# Patient Record
Sex: Male | Born: 1965
Health system: Southern US, Community
[De-identification: ages and names within clinical notes are randomized; demographics above are authoritative.]

## PROBLEM LIST (undated history)

## (undated) DIAGNOSIS — M549 Dorsalgia, unspecified: Secondary | ICD-10-CM

## (undated) DIAGNOSIS — R7303 Prediabetes: Secondary | ICD-10-CM

## (undated) DIAGNOSIS — I509 Heart failure, unspecified: Secondary | ICD-10-CM

## (undated) DIAGNOSIS — J449 Chronic obstructive pulmonary disease, unspecified: Secondary | ICD-10-CM

## (undated) DIAGNOSIS — Z973 Presence of spectacles and contact lenses: Secondary | ICD-10-CM

## (undated) DIAGNOSIS — Z9981 Dependence on supplemental oxygen: Secondary | ICD-10-CM

## (undated) DIAGNOSIS — M1990 Unspecified osteoarthritis, unspecified site: Secondary | ICD-10-CM

## (undated) DIAGNOSIS — IMO0002 Reserved for concepts with insufficient information to code with codable children: Secondary | ICD-10-CM

## (undated) DIAGNOSIS — E291 Testicular hypofunction: Secondary | ICD-10-CM

## (undated) DIAGNOSIS — J45909 Unspecified asthma, uncomplicated: Secondary | ICD-10-CM

## (undated) DIAGNOSIS — R519 Headache, unspecified: Secondary | ICD-10-CM

## (undated) DIAGNOSIS — K469 Unspecified abdominal hernia without obstruction or gangrene: Secondary | ICD-10-CM

## (undated) DIAGNOSIS — G4730 Sleep apnea, unspecified: Secondary | ICD-10-CM

## (undated) DIAGNOSIS — R112 Nausea with vomiting, unspecified: Secondary | ICD-10-CM

## (undated) DIAGNOSIS — Z9889 Other specified postprocedural states: Secondary | ICD-10-CM

## (undated) DIAGNOSIS — Z8719 Personal history of other diseases of the digestive system: Secondary | ICD-10-CM

## (undated) DIAGNOSIS — IMO0001 Reserved for inherently not codable concepts without codable children: Secondary | ICD-10-CM

## (undated) DIAGNOSIS — G709 Myoneural disorder, unspecified: Secondary | ICD-10-CM

## (undated) DIAGNOSIS — Z87442 Personal history of urinary calculi: Secondary | ICD-10-CM

## (undated) DIAGNOSIS — K219 Gastro-esophageal reflux disease without esophagitis: Secondary | ICD-10-CM

## (undated) HISTORY — DX: Heart failure, unspecified: I50.9

## (undated) HISTORY — PX: TONSILLECTOMY: SUR1361

## (undated) HISTORY — DX: Testicular hypofunction: E29.1

## (undated) HISTORY — PX: JOINT REPLACEMENT: SHX530

## (undated) HISTORY — PX: OTHER SURGICAL HISTORY: SHX169

## (undated) HISTORY — PX: BACK SURGERY: SHX140

---

## 1997-10-18 ENCOUNTER — Ambulatory Visit (HOSPITAL_COMMUNITY): Admission: RE | Admit: 1997-10-18 | Discharge: 1997-10-18 | Payer: Self-pay | Admitting: Gastroenterology

## 1997-11-07 ENCOUNTER — Inpatient Hospital Stay (HOSPITAL_COMMUNITY): Admission: RE | Admit: 1997-11-07 | Discharge: 1997-11-10 | Payer: Self-pay | Admitting: Neurosurgery

## 1998-04-11 ENCOUNTER — Ambulatory Visit (HOSPITAL_COMMUNITY): Admission: RE | Admit: 1998-04-11 | Discharge: 1998-04-11 | Payer: Self-pay | Admitting: Neurosurgery

## 1998-04-11 ENCOUNTER — Encounter: Payer: Self-pay | Admitting: Neurosurgery

## 1998-05-02 ENCOUNTER — Ambulatory Visit (HOSPITAL_COMMUNITY): Admission: RE | Admit: 1998-05-02 | Discharge: 1998-05-02 | Payer: Self-pay | Admitting: Neurosurgery

## 1998-05-02 ENCOUNTER — Encounter: Payer: Self-pay | Admitting: Neurosurgery

## 1998-05-23 ENCOUNTER — Encounter: Payer: Self-pay | Admitting: Neurosurgery

## 1998-05-23 ENCOUNTER — Ambulatory Visit (HOSPITAL_COMMUNITY): Admission: RE | Admit: 1998-05-23 | Discharge: 1998-05-23 | Payer: Self-pay | Admitting: Neurosurgery

## 1999-02-15 ENCOUNTER — Encounter: Payer: Self-pay | Admitting: Neurosurgery

## 1999-02-15 ENCOUNTER — Encounter: Admission: RE | Admit: 1999-02-15 | Discharge: 1999-02-15 | Payer: Self-pay | Admitting: Neurosurgery

## 1999-02-24 ENCOUNTER — Ambulatory Visit (HOSPITAL_COMMUNITY): Admission: RE | Admit: 1999-02-24 | Discharge: 1999-02-24 | Payer: Self-pay | Admitting: Neurosurgery

## 1999-02-24 ENCOUNTER — Encounter: Payer: Self-pay | Admitting: Neurosurgery

## 1999-07-25 ENCOUNTER — Encounter: Admission: RE | Admit: 1999-07-25 | Discharge: 1999-10-23 | Payer: Self-pay | Admitting: Anesthesiology

## 1999-09-12 ENCOUNTER — Observation Stay (HOSPITAL_COMMUNITY): Admission: EM | Admit: 1999-09-12 | Discharge: 1999-09-13 | Payer: Self-pay | Admitting: Anesthesiology

## 1999-10-21 ENCOUNTER — Encounter: Payer: Self-pay | Admitting: Neurosurgery

## 1999-10-23 ENCOUNTER — Encounter: Payer: Self-pay | Admitting: Neurosurgery

## 1999-10-23 ENCOUNTER — Inpatient Hospital Stay (HOSPITAL_COMMUNITY): Admission: RE | Admit: 1999-10-23 | Discharge: 1999-10-24 | Payer: Self-pay | Admitting: Neurosurgery

## 1999-10-25 ENCOUNTER — Encounter: Admission: RE | Admit: 1999-10-25 | Discharge: 2000-01-23 | Payer: Self-pay | Admitting: Anesthesiology

## 2000-01-20 ENCOUNTER — Encounter: Admission: RE | Admit: 2000-01-20 | Discharge: 2000-04-19 | Payer: Self-pay | Admitting: Anesthesiology

## 2000-02-27 ENCOUNTER — Ambulatory Visit (HOSPITAL_COMMUNITY): Admission: RE | Admit: 2000-02-27 | Discharge: 2000-02-27 | Payer: Self-pay | Admitting: Neurosurgery

## 2000-04-22 ENCOUNTER — Encounter: Admission: RE | Admit: 2000-04-22 | Discharge: 2000-07-21 | Payer: Self-pay | Admitting: Anesthesiology

## 2000-06-17 ENCOUNTER — Encounter: Admission: RE | Admit: 2000-06-17 | Discharge: 2000-06-17 | Payer: Self-pay | Admitting: Neurosurgery

## 2000-06-17 ENCOUNTER — Encounter: Payer: Self-pay | Admitting: Neurosurgery

## 2000-08-06 ENCOUNTER — Encounter: Admission: RE | Admit: 2000-08-06 | Discharge: 2000-11-04 | Payer: Self-pay | Admitting: Anesthesiology

## 2000-11-16 ENCOUNTER — Encounter: Admission: RE | Admit: 2000-11-16 | Discharge: 2000-12-12 | Payer: Self-pay | Admitting: Anesthesiology

## 2001-03-09 ENCOUNTER — Encounter: Admission: RE | Admit: 2001-03-09 | Discharge: 2001-03-09 | Payer: Self-pay | Admitting: Neurosurgery

## 2001-03-09 ENCOUNTER — Encounter: Payer: Self-pay | Admitting: Neurosurgery

## 2002-12-26 ENCOUNTER — Encounter: Payer: Self-pay | Admitting: Gastroenterology

## 2002-12-26 ENCOUNTER — Encounter: Admission: RE | Admit: 2002-12-26 | Discharge: 2002-12-26 | Payer: Self-pay | Admitting: Gastroenterology

## 2004-01-16 ENCOUNTER — Ambulatory Visit (HOSPITAL_COMMUNITY): Admission: RE | Admit: 2004-01-16 | Discharge: 2004-01-16 | Payer: Self-pay | Admitting: Gastroenterology

## 2004-09-20 ENCOUNTER — Emergency Department (HOSPITAL_COMMUNITY): Admission: EM | Admit: 2004-09-20 | Discharge: 2004-09-20 | Payer: Self-pay | Admitting: Emergency Medicine

## 2006-05-15 ENCOUNTER — Emergency Department (HOSPITAL_COMMUNITY): Admission: EM | Admit: 2006-05-15 | Discharge: 2006-05-15 | Payer: Self-pay | Admitting: Emergency Medicine

## 2006-08-31 ENCOUNTER — Ambulatory Visit (HOSPITAL_COMMUNITY): Admission: RE | Admit: 2006-08-31 | Discharge: 2006-08-31 | Payer: Self-pay | Admitting: Sports Medicine

## 2006-09-05 ENCOUNTER — Emergency Department (HOSPITAL_COMMUNITY): Admission: EM | Admit: 2006-09-05 | Discharge: 2006-09-05 | Payer: Self-pay | Admitting: Emergency Medicine

## 2006-10-20 ENCOUNTER — Ambulatory Visit: Payer: Self-pay | Admitting: Family Medicine

## 2006-10-20 ENCOUNTER — Encounter: Payer: Self-pay | Admitting: Sports Medicine

## 2006-10-20 DIAGNOSIS — M5137 Other intervertebral disc degeneration, lumbosacral region: Secondary | ICD-10-CM

## 2008-02-28 ENCOUNTER — Ambulatory Visit: Payer: Self-pay | Admitting: Sports Medicine

## 2008-02-29 ENCOUNTER — Encounter (INDEPENDENT_AMBULATORY_CARE_PROVIDER_SITE_OTHER): Payer: Self-pay | Admitting: *Deleted

## 2008-03-03 ENCOUNTER — Inpatient Hospital Stay (HOSPITAL_COMMUNITY): Admission: RE | Admit: 2008-03-03 | Discharge: 2008-03-04 | Payer: Self-pay | Admitting: Neurosurgery

## 2009-07-25 ENCOUNTER — Inpatient Hospital Stay (HOSPITAL_COMMUNITY): Admission: RE | Admit: 2009-07-25 | Discharge: 2009-07-27 | Payer: Self-pay | Admitting: Orthopedic Surgery

## 2009-08-10 ENCOUNTER — Encounter: Admission: RE | Admit: 2009-08-10 | Discharge: 2009-08-10 | Payer: Self-pay | Admitting: Orthopedic Surgery

## 2010-07-02 LAB — BASIC METABOLIC PANEL
BUN: 6 mg/dL (ref 6–23)
CO2: 34 mEq/L — ABNORMAL HIGH (ref 19–32)
Chloride: 98 mEq/L (ref 96–112)
Creatinine, Ser: 0.9 mg/dL (ref 0.4–1.5)
Glucose, Bld: 127 mg/dL — ABNORMAL HIGH (ref 70–99)
Potassium: 4.6 mEq/L (ref 3.5–5.1)

## 2010-07-02 LAB — PROTIME-INR: Prothrombin Time: 15.6 seconds — ABNORMAL HIGH (ref 11.6–15.2)

## 2010-07-02 LAB — CBC
HCT: 41.4 % (ref 39.0–52.0)
MCHC: 31.2 g/dL (ref 30.0–36.0)
MCV: 76 fL — ABNORMAL LOW (ref 78.0–100.0)
Platelets: 209 10*3/uL (ref 150–400)
RDW: 17.4 % — ABNORMAL HIGH (ref 11.5–15.5)
WBC: 9.1 10*3/uL (ref 4.0–10.5)

## 2010-07-03 LAB — COMPREHENSIVE METABOLIC PANEL
ALT: 21 U/L (ref 0–53)
AST: 22 U/L (ref 0–37)
CO2: 33 mEq/L — ABNORMAL HIGH (ref 19–32)
Calcium: 9.3 mg/dL (ref 8.4–10.5)
Creatinine, Ser: 0.92 mg/dL (ref 0.4–1.5)
GFR calc Af Amer: >60 mL/min (ref >60)
GFR calc non Af Amer: >60 mL/min (ref >60)
Sodium: 138 mEq/L (ref 135–145)
Total Protein: 7.1 g/dL (ref 6.0–8.3)

## 2010-07-03 LAB — URINALYSIS, ROUTINE W REFLEX MICROSCOPIC
Ketones, ur: NEGATIVE mg/dL
Nitrite: NEGATIVE
Protein, ur: NEGATIVE mg/dL
pH: 6.5 (ref 5.0–8.0)

## 2010-07-03 LAB — BASIC METABOLIC PANEL
BUN: 8 mg/dL (ref 6–23)
Chloride: 92 mEq/L — ABNORMAL LOW (ref 96–112)
Creatinine, Ser: 0.91 mg/dL (ref 0.4–1.5)
Glucose, Bld: 114 mg/dL — ABNORMAL HIGH (ref 70–99)
Potassium: 4.7 mEq/L (ref 3.5–5.1)

## 2010-07-03 LAB — CBC
HCT: 46.4 % (ref 39.0–52.0)
MCHC: 30.5 g/dL (ref 30.0–36.0)
MCHC: 31.1 g/dL (ref 30.0–36.0)
MCV: 75.7 fL — ABNORMAL LOW (ref 78.0–100.0)
MCV: 76.1 fL — ABNORMAL LOW (ref 78.0–100.0)
Platelets: 220 10*3/uL (ref 150–400)
RBC: 6.44 MIL/uL — ABNORMAL HIGH (ref 4.22–5.81)
RDW: 17.6 % — ABNORMAL HIGH (ref 11.5–15.5)
RDW: 17.9 % — ABNORMAL HIGH (ref 11.5–15.5)

## 2010-07-03 LAB — TYPE AND SCREEN: ABO/RH(D): B POS

## 2010-07-03 LAB — APTT: aPTT: 26 seconds (ref 24–37)

## 2010-07-03 LAB — PROTIME-INR: Prothrombin Time: 12.6 seconds (ref 11.6–15.2)

## 2010-08-13 ENCOUNTER — Encounter (HOSPITAL_COMMUNITY)
Admission: RE | Admit: 2010-08-13 | Discharge: 2010-08-13 | Disposition: A | Discharge status: Home / Self Care | Payer: PRIVATE HEALTH INSURANCE | Source: Ambulatory Visit | Attending: Orthopedic Surgery | Admitting: Orthopedic Surgery

## 2010-08-13 ENCOUNTER — Other Ambulatory Visit (HOSPITAL_COMMUNITY): Payer: Self-pay | Admitting: Orthopedic Surgery

## 2010-08-13 ENCOUNTER — Ambulatory Visit (HOSPITAL_COMMUNITY)
Admission: RE | Admit: 2010-08-13 | Discharge: 2010-08-13 | Disposition: A | Discharge status: Home / Self Care | Payer: PRIVATE HEALTH INSURANCE | Source: Ambulatory Visit | Attending: Orthopedic Surgery | Admitting: Orthopedic Surgery

## 2010-08-13 DIAGNOSIS — Z01812 Encounter for preprocedural laboratory examination: Secondary | ICD-10-CM | POA: Insufficient documentation

## 2010-08-13 DIAGNOSIS — Z01811 Encounter for preprocedural respiratory examination: Secondary | ICD-10-CM | POA: Insufficient documentation

## 2010-08-13 DIAGNOSIS — M2390 Unspecified internal derangement of unspecified knee: Secondary | ICD-10-CM | POA: Insufficient documentation

## 2010-08-13 DIAGNOSIS — M2391 Unspecified internal derangement of right knee: Secondary | ICD-10-CM

## 2010-08-13 DIAGNOSIS — G4730 Sleep apnea, unspecified: Secondary | ICD-10-CM | POA: Insufficient documentation

## 2010-08-13 DIAGNOSIS — Z01818 Encounter for other preprocedural examination: Secondary | ICD-10-CM | POA: Insufficient documentation

## 2010-08-13 LAB — CBC
MCH: 21.4 pg — ABNORMAL LOW (ref 26.0–34.0)
MCHC: 28.5 g/dL — ABNORMAL LOW (ref 30.0–36.0)
MCV: 75.2 fL — ABNORMAL LOW (ref 78.0–100.0)
Platelets: 258 10*3/uL (ref 150–400)
RBC: 6.64 MIL/uL — ABNORMAL HIGH (ref 4.22–5.81)
RDW: 18.8 % — ABNORMAL HIGH (ref 11.5–15.5)

## 2010-08-13 LAB — COMPREHENSIVE METABOLIC PANEL
AST: 18 U/L (ref 0–37)
Albumin: 3.6 g/dL (ref 3.5–5.2)
Alkaline Phosphatase: 75 U/L (ref 39–117)
BUN: 17 mg/dL (ref 6–23)
Chloride: 95 mEq/L — ABNORMAL LOW (ref 96–112)
Potassium: 5.2 mEq/L — ABNORMAL HIGH (ref 3.5–5.1)
Total Bilirubin: 0.4 mg/dL (ref 0.3–1.2)

## 2010-08-13 LAB — APTT: aPTT: 32 seconds (ref 24–37)

## 2010-08-13 LAB — SURGICAL PCR SCREEN: MRSA, PCR: NEGATIVE

## 2010-08-13 LAB — URINALYSIS, ROUTINE W REFLEX MICROSCOPIC
Bilirubin Urine: NEGATIVE
Ketones, ur: NEGATIVE mg/dL
Nitrite: NEGATIVE
Protein, ur: NEGATIVE mg/dL
pH: 7 (ref 5.0–8.0)

## 2010-08-21 ENCOUNTER — Ambulatory Visit (HOSPITAL_COMMUNITY)
Admission: RE | Admit: 2010-08-21 | Discharge: 2010-08-21 | Disposition: A | Discharge status: Home / Self Care | Payer: PRIVATE HEALTH INSURANCE | Source: Ambulatory Visit | Attending: Orthopedic Surgery | Admitting: Orthopedic Surgery

## 2010-08-21 DIAGNOSIS — G4733 Obstructive sleep apnea (adult) (pediatric): Secondary | ICD-10-CM | POA: Insufficient documentation

## 2010-08-21 DIAGNOSIS — M23329 Other meniscus derangements, posterior horn of medial meniscus, unspecified knee: Secondary | ICD-10-CM | POA: Insufficient documentation

## 2010-08-21 DIAGNOSIS — K219 Gastro-esophageal reflux disease without esophagitis: Secondary | ICD-10-CM | POA: Insufficient documentation

## 2010-08-21 DIAGNOSIS — M23302 Other meniscus derangements, unspecified lateral meniscus, unspecified knee: Secondary | ICD-10-CM | POA: Insufficient documentation

## 2010-08-27 NOTE — Op Note (Signed)
NAMENIKODEM, LEADBETTER NO.:  [id number]   MEDICAL RECORD NO.:  [id number]          PATIENT TYPE:  INP   LOCATION:  3101                         FACILITY:  [facility]   PHYSICIAN:  Payton Doughty, M.D.      DATE OF BIRTH:  11/01/1965   DATE OF PROCEDURE:  03/03/2008  DATE OF DISCHARGE:                               OPERATIVE REPORT   PREOPERATIVE DIAGNOSIS:  Dead battery in morphine pump.   POSTOPERATIVE DIAGNOSIS:  Dead battery in morphine pump.   OPERATIVE PROCEDURE:  Replacement of morphine pump.   ATTENDING SURGEON:  Payton Doughty, MD   ANESTHESIA:  General endotracheal.   PREPARATION:  Prepped and draped with alcohol wipe.   COMPLICATIONS:  None.   There is no Geophysicist/field seismologist.   BODY OF TEXT:  This is a 45 year old gentleman with morphine pump he has  had for about 10 years.  Battery is dead and he was taken to the  operating room, smoothly anesthetized and intubated, placed supine on  the operating table.  Following shave, prep, and drape in usual sterile  fashion, the pocket on the left upper quadrant of the abdomen was  reopened and carefully dissected to identify the pump.  The pouch that  it had been placed in was opened and the pump carefully mobilized so as  not to damage any of the tubing.  The pump was externalized and it was  found that there was a crack in the connector at the pump.  There was  some fluid in the pocket.  The old tubing was trimmed and the extender  was connected to it and secured with the stitch.  There was good flow of  CSF back through the tubing.  The tubing was aspirated and afterwards  connected to the pump and the pump replaced into the pocket.  The pump  had previously been filled.  Successive layers of 0 Vicryl, 2-0 Vicryl,  and 3-0 nylon were used to close.  Betadine and Telfa dressing was  applied and made occlusive with OpSite and the patient returned to  recovery room in good condition.     ______________________________  Payton Doughty, M.D.     MWR/MEDQ  D:  03/03/2008  T:  03/04/2008  Job:  [id number]

## 2010-08-27 NOTE — H&P (Signed)
NAMEAMMON, [name] NO.:  [id number]   MEDICAL RECORD NO.:  [id number]          PATIENT TYPE:  INP   LOCATION:  3101                         FACILITY:  [facility]   PHYSICIAN:  Payton Doughty, M.D.      DATE OF BIRTH:  [birth year]   DATE OF ADMISSION:  03/03/2008  DATE OF DISCHARGE:                              HISTORY & PHYSICAL   ADMITTING DIAGNOSIS:  Implace morphine pump with a drain battery.   This is a 45 year old right-handed white gentleman has had lumbar fusion  numerous years ago, guided after a fall.  He has had a morphine pump in  for about 10 years, the battery has failed, he is here now for placement  of the battery.   MEDICAL HISTORY:  Unremarkable for significant diseases.  He has had  arthroscopy of his knee and ACL reconstruction.  He has had lumbar  fusion from L3 through S1.   MEDICATIONS:  None.   SOCIAL HISTORY:  Does not smoke, does not drink, and is on disability.   FAMILY HISTORY:  Both parents are deceased.   REVIEW OF SYSTEMS:  Noncontributory.   PHYSICAL EXAMINATION:  HEENT:  Normal limits.  NECK:  Supple.  No lymphadenopathy.  CHEST:  Clear.  CARDIAC:  Unremarkable.  ABDOMEN:  Large, but nontender with no hepatosplenomegaly.  EXTREMITIES:  No clubbing, cyanosis.  Peripheral pulses are good.  GU:  Deferred.  NEUROLOGICAL:  He is awake, alert, and oriented.  Cranial nerves are  intact.  Motor exam shows 5/5 strength throughout the upper and lower  extremities.  He has intractable activity related back pain when he is  not on his pump.   The pump is palpable in abdomen.   CLINICAL IMPRESSION:  Morphine pump need to be placed and the plan is to  replace it.  The risks and benefits had been discussed with him and he  wished to proceed.           ______________________________  Payton Doughty, M.D.    MWR/MEDQ  D:  03/03/2008  T:  03/03/2008  Job:  [id number]

## 2010-08-30 NOTE — Consult Note (Signed)
[facility]  Patient:    [name], [name]                      MRN: [id number] Proc. Date: 03/13/00 Adm. Date:  [id number] Attending:  Thyra Breed CC:         Payton Doughty, M.D.   Consultation Report  FOLLOW-UP EVALUATION  [name] comes in for follow-up evaluation of his failed back surgery syndrome. He is here for refill of his pump and reprogramming. He is consistent with taking about 90 mg of morphine on top of the pump, so after it was filled today I went ahead and had it reprogrammed up to 4.6 mg per day.  He is noticing some burning dysesthesias which are increased; so I am going to take him off the Neurontin gradually and start Trileptal. He was given a prescription for 400 mg of Neurontin to be taken 1 p.o. t.i.d. x 7 days and 1 p.o. b.i.d. x 7 days and 1 p.o. q.d. x 7 days #50 with no refill and Trileptal 150 mg 1/2 tablet p.o. q.p.m. x 7 days and 1/2 p.o. b.i.d. x 7 days and 1 tablet twice a day. He was advised of the side effects of both these. He will let us know next week how he is doing with this. I plan to see him back in follow-up in 4 weeks at which time he is really due for a repeat refill and we will consider bolus injections if he is not noticing improvements with the increased infusion rate and switching him over to trileptol. DD:  03/13/00 TD:  03/13/00 Job: [id number]

## 2010-08-30 NOTE — Consult Note (Signed)
[facility]  Patient:    [name], [name]                      MRN: [id number] Proc. Date: 12/30/99 Adm. Date:  [id number] Attending:  Thyra Breed                          Consultation Report  FOLLOWUP EVALUATION:  This is a followup to reprogram his pump as well as a refill by the nurse.  The patient is taking up to 120 to 150 mg of MSIR on top of his preexisting dose of 3 mg per day of morphine through the pump.  I advised him that I would like to go up to 0.5 on the dose per day and to increase the Neurontin, since he has developed a burning type of dysesthesia in his leg.  We will go ahead and increase his Neurontin to 800 mg q.6h. as well as increase the pump rate to 3.5 mg per day.  He is to continue with the [facility] for breakthrough pain.  His pump refill was performed by Lacie Scotts, our R.N., my nurse assisting me today.  We will go ahead and see him back in followup in about four to six weeks.  In the interim, he is to call us in about two weeks to let us know if he is getting any better. DD:  12/30/99 TD:  12/30/99 Job: [id number]

## 2010-08-30 NOTE — Procedures (Signed)
[facility]  Patient:    [name], [name] Visit Number: [id number] MRN: [id number]          Service Type: PMG Location: [facility] Attending Physician:  Thyra Breed Proc. Date: 12/02/00 Adm. Date:  11/16/2000   CC:         Payton Doughty, M.D.   Procedure Report  PROCEDURE:  Refill of pump and interrogation. The pump refill was done by the nurse.  DIAGNOSIS:  Failed back surgery syndrome with underlying lumbar degenerative disk disease.  HISTORY OF PRESENT ILLNESS:  The patient continues to have dysesthesias of his lower extremities. He feels like he has stone bruises on his feet and legs with burning pain. This has not improved despite going up on the Neurontin and the trileptal.  The patients noting that he has forgetfulness and he is constipated from his opiates.  CURRENT MEDICATIONS:  Trileptal 300 mg two tablets twice a day. Neurontin 600 mg q. 8h. Celebrex 200 mg. Promethazine p.r.n. Baclofen 20 mg three times a day. Alprazolam 1 mg q. 8h p.r.n. Ditropan p.r.n.  Methadone 5 mg q. 6h. MSIR p.r.n.  Lasix p.r.n.  Glucosamine, nexium, and Proventil inhaler.  PHYSICAL EXAMINATION:  Blood pressure 111/51, heart rate is 91, respiratory rate 18, O2 saturations 95%, pain level is 10. His neuro exam is grossly unchanged from his last visit.  IMPRESSION:  Predominance of neuropathic pain associated with his lumbar degenerative disk disease and failed back surgery syndrome.  DISPOSITION:  I advised the patient that I did not feel going up on his opiates would be of much benefit at this time and recommended that we take him off of his trileptal which I am going to wean over the next two weeks and start him on Gabitril 4 mg one p.o. q.p.m. x 7 days and one p.o. b.i.d. x 7 days, and on p.o. t.i.d. #100 with two refills.  His pump was refilled today by the nurse. His next refill time will be by January 24, 2001 which is the alarm dates. We will try to make  an appointment two days before this for it to be refilled. He is to let me know how he tolerates the Gabitril. I plan to see him back in follow-up in October. He is to call if he has problems between now and then. Attending Physician:  Thyra Breed DD:  12/02/00 TD:  12/02/00 Job: [id number]

## 2010-08-30 NOTE — Op Note (Signed)
[facility]  Patient:    [name], [name]                      MRN: [id number] Proc. Date: 02/10/00 Adm. Date:  [id number] Attending:  Thyra Breed CC:         Payton Doughty, M.D.   Operative Report  PROCEDURE:  Refill of patients opiate pump and interrogation.  INTERVAL HISTORY:  The patient has noted his pain continues to be about 7 out of 10.  He is taking up to four tablets of the MSIRs, 30 mg per day.  He has been pushing himself pretty hard recently, and I suspect some of his pain is coming from his increased activity.  He complains of intermittent weakness of his lower extremities.  He asked about some swelling of the lower extremities, but this responded to diuretics.  PHYSICAL EXAMINATION:  VITAL SIGNS:  Blood pressure 139/60, heart rate 104, respiratory rate 16, O2 saturation 97%, pain level 7 out of 10.  Temperature is 99.9.  HEENT:  He has some nasal congestion which is clear and has a cold.  INTERROGATION:  Showed that he continues to receive his infusion at a rate of 3.9.  It was adjusted up to 4.3 today with refill by the nurse.  I plan to see him back in followup at his next refill.  In the interim, I have given him a prescription for MSIR 30 mg one p.o. q 6 hours p.r.n. breakthrough pain, #100, with no refill and for Phenergan 25 mg one p.o. q 6 hours, #100, with three refills. DD:  02/10/00 TD:  02/10/00 Job: [id number]

## 2010-08-30 NOTE — Procedures (Signed)
[facility]  Patient:    [name], [name]                      MRN: [id number] Proc. Date: 10/12/00 Adm. Date:  [id number] Attending:  Thyra Breed CC:         Payton Doughty, M.D.  Neita Carp, Workers Comp case manager   Procedure Report  CLINICAL NOTE:  [name] comes in for follow-up evaluation and reprogramming of his pump as well as a refill.  He is currently receiving a bolus of 1.75 with continuous infusion of 0.5 between three boluses.  He notes that his pain level with the MSIR taking 150 mg a day is down to 7.  He is walking more, is asking about getting back into the [facility] water therapy, and I have given him a prescription for this.  He is asking about going on Meridia, and I do not think it is a good idea in the context of the other medications he is taking.  Current medications are his MSIR 1-1/2 tablets q.6h. p.r.n. of 30 mg, Baclofen 20 mg q.6h., Neurontin 400 mg q.12h., Trileptal 300 mg in the morning and 600 mg in the evening, methadone 5 mg q.6h., Nexium, alprazolam, Ditropan, Celebrex, Allegra D, promethazine, Lasix, Advair, Proventil.  PHYSICAL EXAMINATION:  VITAL SIGNS:  Blood pressure is 117/38, heart rate is 95, respiratory rate is 19, O2 saturation is 94%, pain level is 7 out of 10.  NEUROLOGIC:  Grossly unchanged.  DESCRIPTION OF PROCEDURE:  The patients pump was interrogated, emptied, and then refilled with 25 mg/ml concentration rather than 10 mg/ml concentration. It was reprogrammed to give 1.95 bolus three times a day with a continuous infusion of 0.548.  He was given a prescription for methadone 5 mg one p.o. q.6h., #120.  I advised him not to go on Meridia.  He is scheduled to see [place] back on December 02, 2000, for refill.  I have given him a prescription to get back into the [facility] therapy.  He was encouraged to try and recondition himself as much as he can. DD:  10/13/19 TD:  10/12/00 Job: [id number]

## 2010-08-30 NOTE — H&P (Signed)
[facility]  Patient:    [name], [name]                      MRN: [id number] Proc. Date: 05/29/00 Adm. Date:  [id number] Attending:  Thyra Breed                         History and Physical  FOLLOW-UP EVALUATION:  HISTORY OF PRESENT ILLNESS:  [name] comes in for followup evaluation today.  He states that he initially did quite well after his last pump refill for about a week.  He had minimal discomfort.  He is back up to taking about five tablets of the morphine today but complains predominately of a tingling type sensation in his lower extremities.  CURRENT MEDICATIONS:  His current medications include:  1. Trileptal 300 mg b.i.d.  2. Methadone 5 mg q.8h.  3. Glucosamine.  4. Neurontin 400 mg q.8h.  5. MSIR 30 mg q.6h. p.r.n.  6. Promethazine 25 mg q.6h. p.r.n.  7. Baclofen 20 mg one q.8h.  8. Alprazolam 0.5 mg q.8h.  9. Celebrex 200 mg one q.12h. 10. Furosemide 20 mg q.d. 11. Ditropan XL 15 mg q.d. 12. Nexium. 13. Allegra-D. 14. He has recently been on Biaxin.  EXAMINATION:  Blood pressure is 116/51, heart rate is 87, respiratory rate is 20, O2 saturation is 94%.  Pain level is 7/10.  His pump was interrogated and refilled by the nurse today.  I went ahead and prescribed Trileptal at 300 mg two in the evening and one in the morning, #90 and advised him would continue for at least two weeks with this adjustment with his other medications the same.  He did not need refills on the other medications. DD:  05/29/00 TD:  05/30/00 Job: [id number]

## 2010-08-30 NOTE — H&P (Signed)
[facility]  Patient:    [name], [name]                      MRN: [id number] Adm. Date:  [id number] Attending:  Thyra Breed CC:         Payton Doughty, M.D.   History and Physical  FOLLOWUP EVALUATION  HISTORY OF PRESENT ILLNESS:   [name] comes in for follow-up evaluation of his chronic low back pain syndrome on the basis of lumbar degenerative disc disease with post-laminectomy pain syndrome.  Since his last evaluation he was doing remarkable well on his current regimen through the infusion, but overdid it over the weekend.  He is feeling much worse now.  He was able to reduce his MSIR to nearly none per day.  He is back up to four tablets per day.  He is continuing to require a significant amount of Phenergan.  CURRENT MEDICATIONS:  Unchanged from his last visit.  PHYSICAL EXAMINATION:  VITAL SIGNS:  Blood pressure 87/44, heart rate 94, respiratory rate 16, and O2 saturation 93%.  Pain level 7/10.  NEUROLOGIC:  His deep tendon reflexes were symmetric in the lower extremities with positive straight leg raise sign on the left side.  IMPRESSION:  Flare up of his back discomfort which I suspect is more of an acute strain syndrome superimposed on his chronic low back pain on the basis of a post-laminectomy pain syndrome with lumbar degenerative disc disease.  DISPOSITION: 1. Continue current medications and prescription written for Phenergan 25 mg 1    p.o. q.4-6h. p.r.n., #120 with three refills. 2. Followup with me on Aug 31, 2000, for refill of pump.  We will continue on    current settings for the time being. DD:  08/20/00 TD:  08/21/00 Job: [id number]

## 2010-08-30 NOTE — Procedures (Signed)
[facility]  Patient:    JAWAD, WIACEK                      MRN: [id number] Proc. Date: 08/31/00 Adm. Date:  [id number] Attending:  Thyra Breed CC:         Payton Doughty, M.D.                           Procedure Report  PROCEDURE:  Refill of pump by nursing with morphine sulfate and reprogramming.  Chanetta Marshall comes in for reprogramming and refill this pump. He notes that he is using about 3-5 MSIR per day. He has had a couple of bouts of nausea since his last visit but overall is doing better. He does have some excoriations over his right hand from reaching down into a [place] and is not taking any antibiotics for this. I advised him that if he has any chance of a bacterial infection, we need to know about it so we can put him on antibiotics in order to prevent any infection of the pump.  His medications are unchanged.  Pump interrogation shows that it is the same setting as previously. He had his pump refilled today. He had a little over 2 ml in the reservoir.  The patients pump was reprogrammed to boluses of 1.6 at 8 hour intervals with 0.5 between the boluses. His next alarm date is June 14.  Overall he is showing some signs of improvement and he notes that the burning is much less except when weather changes are occurring. He is trying to exercise a bit more than he has in the past. I encouraged him to proceed with this. DD:  08/31/00 TD:  08/31/00 Job: [id number]

## 2010-08-30 NOTE — Consult Note (Signed)
[facility]  Patient:    [name], [name]                      MRN: [id number] Proc. Date: 09/04/99 Adm. Date:  [id number] Attending:  Thyra Breed CC:         Payton Doughty, M.D.                          Consultation Report  FOLLOW-UP EVALUATION:  [name] comes in for follow-up evaluation of his chronic low back pain on the basis of degenerative disk disease and spondylosis.  He continues to have a lot of difficulties tolerating his opiates with a lot of nausea, and he has been vomiting, and this has led to a lot of soreness in his mouth.  He continues on methadone 20 mg q.6h., MSIR, taking usually five to six tablets of 30 mg per day, and Phenergan in addition to his other medications.  He has been seen by Dr. Dellia Cloud, who feels like he is a low-risk candidate for a spinal pump.  I discussed with the patient the fact that we would need to go ahead and set him up for this.  PHYSICAL EXAMINATION:  VITAL SIGNS:  Exam today reveals blood pressure 147/64, heart rate 89, respiratory rate 20, O2 saturation 96%.  Pain level is 8.5 out of 10. Temperature is 97.6.  MUSCULOSKELETAL:  He shows a well-healed surgical scar over his back.  He is in obvious discomfort.  IMPRESSION: 1. Low back pain on the basis of degenerative disk disease and lumbar    spondylosis. 2. Nausea and vomiting to opiates with intolerable side effects.  DISPOSITION: 1. Will go ahead and set him up for a single-dose injection of intraspinal    morphine to ascertain whether he is a candidate for morphine pump. 2. Will continue on Phenergan 25 mg one p.o. q.6h. p.r.n., #100 with two    refills, MSIR one p.o. q.4-6h. of 30 mg, #180 with no refill, and methadone    5 mg four tablets q.6h. #240 with no refills. DD:  09/04/99 TD:  09/09/99 Job: [id number]

## 2010-08-30 NOTE — H&P (Signed)
[facility]  Patient:    [name], [name]                      MRN: [id number] Adm. Date:  [id number] Disc. Date: [id number] Attending:  Thyra Breed CC:         Payton Doughty, M.D.   History and Physical  FOLLOW-UP EVALUATION:  [name] comes in for follow-up evaluation today. We shifted him to a bolus last week at a rate of 0.9 bolus twice a day with basal rate of 0.025 mg/h. He initially noted quite a positive response, but this has waned significantly. He does note that he gets a response but is not nearly as much as previously. He is taking up to 150 mg of the MSIR per day. He continues on his baclofen, Neurontin, alprazolam, Trileptal, Phenergan, Nexium, and Ditropan.  PHYSICAL EXAMINATION:  VITAL SIGNS:  Blood pressure is 98/64, heart rate is 96, respiratory rate is 18, O2 saturation is 97%, pain level is 8/10.  NEUROLOGICAL:  The patient is obviously uncomfortable and diaphoretic.  The patients pump was interrogated and reprogrammed. He currently is receiving his 1 mg over a half hour period in over a 11 1/2 hour period. He received 0.5 mg per hour. I advised him to call me on Monday. If he is not improving on this dose, we will consider adding in low-dose methadone to see if we can avoid some of the tolerance he is suffering from or consider reprogramming the pump and adding in clonidine. I am concerned about adding in the clonidine in that we will probably have to take away the bolus which I feel like is giving him a little better control than previously. In addition, we can optimize the Trileptal a bit more. I spoke with Neita Carp, his case manager, today. DD:  04/22/00 TD:  04/22/00 Job: [id number]

## 2010-08-30 NOTE — Procedures (Signed)
[facility]  Patient:    [name], [name]                      MRN: [id number] Proc. Date: 12/13/99 Adm. Date:  [id number] Attending:  Thyra Breed CC:         Payton Doughty, M.D.   Procedure Report  PROCEDURE:  Evaluation of intrathecal pump with programming.  HISTORY OF PRESENT ILLNESS:  [name] continues to use about 180 mg of MSIR on top of his Baclofen, Neurontin, alprazolam, promethazine, and Celebrex.  He is also taking Nexium.  PHYSICAL EXAMINATION:  The patient is afebrile with vital signs stable.  DESCRIPTION OF PROCEDURE:  The patient was placed in the supine position, and interrogation of his pump confirmed that he was getting 2.4 mg per day.  It was reprogrammed with the assistance of my nurse, Josue Hector, to 3 mg per 24 hours.  He is to continue his other medications, and will see if this allows him to get less of a need for the MSIR.  He is scheduled to see [place] back on September 17, at which time we will need to refill his pump.  We discussed this with Neita Carp, his case manager. DD:  12/13/99 TD:  12/15/99 Job: [id number]

## 2010-08-30 NOTE — H&P (Signed)
[facility]  Patient:    [name], [name]                      MRN: [id number] Adm. Date:  [id number] Disc. Date: [id number] Attending:  Emeterio Reeve                         History and Physical  Breydon had his pump placed on Wednesday and comes in for a refill today.  I spoke with him to discuss how he is doing and to taper him off of his oral opiates.  I have given him an outline to go down to MSIR 30 mg one p.o. t.i.d. x 3 days, then one-half tablet t.i.d. x 3 days, then one-half tablet b.i.d. x 3 days, then discontinue.  He states he is doing very well with his pump, getting 1.8 mg/24 hours.  We will review his other medications at his next visit in four weeks and discuss adjustments in these. DD:  10/26/99 TD:  10/26/99 Job: [id number]

## 2010-08-30 NOTE — Procedures (Signed)
[facility]  Patient:    [name], [name]                      MRN: [id number] Proc. Date: 07/09/00 Adm. Date:  [id number] Attending:  Thyra Breed CC:         Payton Doughty, M.D.                           Procedure Report  PROCEDURE:  Refill of pump by nurses and reprogramming.  INDICATION:  The patient comes in for a refill today of his pump, which the nurses have done.  He is currently on Trileptal 300 mg one in the morning, two in the evening; methadone 5 mg q.6h.; glucosamine; Neurontin 400 mg q.12h.; MSIR 30 mg one and a half tablets q.4h. as needed, he is taking up to eight tablets per day; promethazine; baclofen 20 mg q.8h.; alprazolam; Celebrex; furosemide; Ditropan; Nexium Allegra.  His blood pressure is 112/86.  Heart rate is 85.  Respiratory rate is 18.  O2 saturation is 95%.  Pain level is 7/10.  The patient complains of ongoing shooting pains but he has noted that by upping the bolus last week, he noticed some relief in the morning, although he requires the same amount of MSIR.  DISPOSITION: 1. Continue on current medications but increase the boluses to 1.9 mg twice a    day.  I discussed this with his workers Engineer, manufacturing today. 2. I plan to see him back in followup on August 08, 2000 for a refill and    reprogramming if needed. DD:  07/09/00 TD:  07/09/00 Job: [id number]

## 2010-08-30 NOTE — Op Note (Signed)
NAME:  [name], [name]               ACCOUNT NO.:  [id number]   MEDICAL RECORD NO.:  [id number]          PATIENT TYPE:  AMB   LOCATION:  ENDO                         FACILITY:  [facility]   PHYSICIAN:  John C. Madilyn Fireman, M.D.    DATE OF BIRTH:  September 24, 1965   DATE OF PROCEDURE:  01/16/2004  DATE OF DISCHARGE:                                 OPERATIVE REPORT   PROCEDURE:  Esophagogastroduodenoscopy.   INDICATIONS FOR PROCEDURE:  Heme positive stools.   PROCEDURE:  The patient was placed in the left lateral decubitus position  and placed on the pulse monitor with continuous low-flow oxygen delivered by  nasal cannula. He was sedated with 100 mcg IV fentanyl and 10 mg of IV  Versed. Olympus video endoscope was advanced under direct vision to the  oropharynx and esophagus. The esophagus was straight and with normal  caliber. Squamocolumnar line at 38 cm. There was a 1.5-cm hiatal hernia. No  other abnormalities to the GE junction. Stomach was entered. A small amount  of liquid secretions were suctioned from the fundus. Retroflex view of the  cardiac was unremarkable although it was somewhat suboptimal given the  patient had difficulty holding air. The fundus, body, antrum, and pylorus  all appeared normal. The duodenum was entered. Both bulb and second portion  were well inspected and appeared to be within normal limits. The scope was  then withdrawn, and the patient prepared for colonoscopy. He tolerated the  procedure well. There were no immediate complications.   IMPRESSION:  Hiatal hernia, otherwise normal study.   PLAN:  Proceed with colonoscopy.      JCH/MEDQ  D:  01/16/2004  T:  01/16/2004  Job:  [id number]   cc:   Meredith Staggers, M.D.  [address]  [place]  [place] [address]  Fax: [phone]

## 2010-08-30 NOTE — Discharge Summary (Signed)
[place]  Patient:    [name], [name]                      MRN: [id number] Adm. Date:  [id number] Disc. Date: 10/24/99 Attending:  Emeterio Reeve CC:         Payton Doughty, M.D.                           Discharge Summary  ADMITTING DIAGNOSIS:  Intractable pain.  DISCHARGE DIAGNOSIS:  Intractable pain.  PROCEDURE:  Implantation of morphine pump.  COMPLICATIONS:  None.  HISTORY OF PRESENT ILLNESS:  This is a 45 year old, right-handed white gentleman whose History and Physical is recounted in the chart.  He has had numerous spine operations and basically has failed back.  He is admitted for pump implant.  His exam was intact with intractable back and leg pain.  He had previously undergone a trial injection and had done well.  HOSPITAL COURSE:  He was admitted after ascertaining normal laboratory values and underwent an implantation of an intrathecal morphine pump.  He did extremely well postoperatively.  His strength is full and remains neurologically intact.  His incisions sites are dry without erythema.  He has marked reduction in his discomfort.  DISCHARGE MEDICATIONS:  Discharged home on all his normal medications except for his opiates.  FOLLOWUP:  Follow up with Dr. Vear Clock tomorrow for filling of the pump. Follow up in my office in one week for suture removal. DD:  10/24/99 TD:  10/24/99 Job: [id number]

## 2010-08-30 NOTE — Consult Note (Signed)
[facility]  Patient:    [name], [name]                      MRN: [id number] Proc. Date: 09/23/99 Adm. Date:  [id number] Attending:  Thyra Breed CC:         Payton Doughty, M.D.             Workmans Comp.                          Consultation Report  FOLLOW-UP EVALUATION  HISTORY OF PRESENT ILLNESS:  Mr. Obryan comes in for follow-up evaluation of his chronic low back pain on the basis of degenerative disk disease and failed back surgery syndrome. Since his last evaluation, the patient has had return of his pain. He had a very good response to the single shot spinal morphine with approximately 12 hours of fairly marked pain reduction with 0.9 mg of morphine. He is back on the methadone 20 mg 4 times a day and the MSIR and notes that it is causing some problems with nausea. He continues on his inhalers, Phenergan p.r.n., Allegra, and baclofen.  He rates his pain at about 8/10. It is predominantly localized to his lower back.  PHYSICAL EXAMINATION:  VITAL SIGNS:  Blood pressure 129/73, heart rate is 107, respiratory rate 20, O2 saturations 96% and temperature is 97.6.  EXTREMITIES:  Deep tendon reflexes were 0 to 1+ at the knees, 0 at the right ankle and 1-2+ at the left ankle. Straight leg raise signs were positive bilaterally at 80 degrees.  IMPRESSION:  Low back pain on the basis of failed back surgery syndrome and degenerative disk disease.  DISPOSITION: 1. Discontinue methadone. 2. Kadian 100 mg 1 p.o. q.d. #14 with no refill. 3. Continue with MSIR. 4. Continue with other medications including Neurontin and baclofen as well    as Celebrex. 5. I will see in follow-up in approximately 4 weeks if he has not had his    surgery. The patient had a very positive response to his interspinal    morphine injection. DD:  09/23/99 TD:  09/23/99 Job: [id number]

## 2010-08-30 NOTE — H&P (Signed)
[place]. [facility]  Patient:    [name], [name]                      MRN: [id number] Adm. Date:  [id number] Attending:  Emeterio Reeve                         History and Physical  ADMITTING DIAGNOSES:  Intractable pain.  BRIEF HISTORY:  This is a now 45 year old right-handed white gentleman who I have been following for numerous years.  He injured his back a long time ago in a fall and has had several laminectomies, a two-level fusion in the past, and he still has intractable back and leg pain.  He did well with a morphine injection trial and is now here for implantation for a morphine pump.  MEDICAL HISTORY:  Remarkable for the problems as noted before.  MEDICATIONS:  1. Baclofen 20 mg q.8h.  2. Neurontin 800 mg q.8h.  3. Alprazolam 0.5 mg q.8h.  4. Phenergan 25 mg q.12h.  5. Celebrex 200 mg q.12h.  6. MSIR 30 mg q.4h.  7. Allegra.  8. ______  100 mg b.i.d.  9. Ditropan XL 10 mg q.d. 10. ________ 300 mg q.d. 11. ______ 40 mg a day. 12. Maalox p.r.n. 13. Serevent inhaler p.r.n.  ALLERGIES:  AUGMENTIN and PLASTIC TAPE.  FAMILY HISTORY:  Noncontributory.  REVIEW OF SYSTEMS:  Remarkable for back and leg pain.  HEENT:  Within normal limits.  NECK:  He has reasonable range of motion.  CHEST:  Cardiac exam is regular rate and rhythm.  ABDOMEN:  Nontender.  No hepatosplenomegaly.  No scars.  EXTREMITIES:  Without clubbing or cyanosis.  GU:  Deferred.  PERIPHERAL PULSES:  Good.  NEUROLOGIC:  He is awake, alert, oriented.  His cranial nerves are intact.  MOTOR EXAM:  Demonstrates 5/5 strength throughout the upper and lower extremities except for pain with hip flexion weakness.  Lower extremity reflexes are absent.  CLINICAL IMPRESSION:  Intractable back pain and he is here for morphine pump implantation.  The risks and benefits, have been discussed with him and he wishes to proceed. DD:  10/23/99 TD:  10/23/99 Job:  [id number]

## 2010-08-30 NOTE — Procedures (Signed)
[facility]  Patient:    [name], [name]                      MRN: [id number] Proc. Date: 08/06/00 Adm. Date:  [id number] Attending:  Thyra Breed CC:         Payton Doughty, M.D.   Procedure Report  PROCEDURE:  Supervision of pump refill and reprogramming.  [name] comes in today to get his pump refilled. He has this done by Johnney Ou, R.N. under sterile conditions. Afterwards, we went ahead and reprogrammed his pump so that he gets 1.5 mg at 7, 3 and 11 and continues with a continuous infusion of 0.5 between. This will give him a total of 6.048 mg per day up from 4.945. In addition I stopped his Neurontin, increased his trileptal to two tablets twice a day at 300 mg and refilled the methadone at 5 mg q. 6h #120 and the MSIR 30 mg 1-1.5 tablets q. 4-6h #100. I plan to see him back in follow-up in two weeks. His other medications are unchanged. DD:  08/06/00 TD:  08/07/00 Job: [id number]

## 2010-08-30 NOTE — Consult Note (Signed)
[facility]  Patient:    [name], [name]                      MRN: [id number] Proc. Date: 11/22/99 Adm. Date:  [id number] Attending:  Thyra Breed CC:         Payton Doughty, M.D.                          Consultation Report  FOLLOWUP EVALUATION:  [name] comes in for followup evaluation.  He continues to use about 180 mg of MSIR per day on top of his pump.  He is also on Baclofen, Neurontin, alprazolam, promethazine and Celebrex as well as Nexium.  EXAMINATION  VITAL SIGNS:  Blood pressure 129/62, heart rate is 92, respiratory rate is 16, O2 saturation is 94%, pain level is 8/10 and temperature is 97.5.  GENERAL:  He is in pretty good spirits considering his current situation.  DESCRIPTION OF PROCEDURE:  We interrogated his pump and it is running at 1.8 mg per day.  We went ahead and reprogrammed it to run at 2.4 mg per day.  PLAN:  We will bring him back on December 31, 1999 to refill the pump, since it will start to alarm on January 05, 2000.  DIAGNOSIS:  His diagnosis remains unchanged, with diagnosis of failed back surgery syndrome and degenerative disk disease. DD:  11/22/99 TD:  11/22/99 Job: [id number] QI/[id number]

## 2010-08-30 NOTE — Procedures (Signed)
[facility]  Patient:    [name], [name]                      MRN: [id number] Proc. Date: 09/24/00 Adm. Date:  [id number] Attending:  Thyra Breed CC:         Payton Doughty, M.D.   Procedure Report  PROCEDURE:  Evaluation of intrathecal pump with reprogramming.  DIAGNOSES:  Lumbar degenerative disk disease, status post surgical interventions with ongoing pain.  INTERNAL HISTORY:  The patient has noted that his pain continues to persist to a fairly significant rate, but he feels like it is more related to the fact that he is using a generic form or MSIR. I advised him that since he is using about 120 mg of MSIR per day, we would go ahead and increase his boluses.  PROCEDURE:  He had his pump refilled by the nurse, and we reprogrammed it to go from a bolus of 1.6 to a bolus of 1.75 with ongoing continuous infusion at 0.5.  He is scheduled to see [place] back on October 14, 2000, at which time we will need to refill the pump and we will likely need to go to a higher concentration.  DISPOSITION:  He was given prescriptions for cephalexin 500 mg one p.o. b.i.d. to take for the time being since he has had some dental procedures done and MSIR 30 mg 1-1-1/2 q.6h. p.r.n. breakthrough pain.  He is aware he needs to go lightly on this at first because of the adjustments in the pump. DD:  09/24/00 TD:  09/24/00 Job: [id number]

## 2010-08-30 NOTE — H&P (Signed)
[facility]  Patient:    ARVAL, BRANDSTETTER                      MRN: [id number] Adm. Date:  [id number] Attending:  Thyra Breed CC:         Neita Carp, R.N.; workers compensation  Payton Doughty, M.D.   History and Physical  Asher Muir called yesterday stating that he was getting increasing numbness in his lower extremities.  He was brought in today to assess this.  He describes increased numbness and tingling in his lower extremities with what he feels is like stone bruises on his feet and his knees.  He was asked to fill out a pictogram and the distribution of the discomfort is identical to what it was back in March but as he puts it it is more intensification of the discomfort.  In looking back it seems as though this started as we tried to wean him down from the Neurontin.  In addition, he is having increasing problems with bladder control and recently has had a urinary tract infection.  This seems to be correlating with the increase in opiates that he is requiring.  He states that he does not feel secure when he stands up, but was very vague in describing weakness.  CURRENT MEDICATIONS:  1. Trileptal 300 mg two tablets b.i.d.  2. Neurontin 400 mg q.8h.  3. Celebrex 200 mg q.12h.  4. Promethazine 25 mg q.d.  5. Baclofen 25 mg q.i.d.  6. Alprazolam 1 mg q.8h.  7. Ditropan 15 mg q.d.  8. Methadone 5 mg q.6h.  9. MS IR 30 mg one to one and a half q.6h. p.r.n. breakthrough pain. 10. Lasix 20 mg p.r.n. 11. Glucosamine. 12. Nexium 40 mg. 13. Proventil inhaler.  PHYSICAL EXAMINATION  VITAL SIGNS:  Blood pressure 124/56, heart rate 86, respiratory rate 17, O2 saturation 98%, pain level markedly elevated, blood sugar 83.  EXTREMITIES:  He exhibited 2+ right knee jerk, 0-1+ left.  Ankles were 1+ and symmetric.  Motor demonstrated relatively intact, although his great toe dorsiflexors were equivocally mildly weak.  NEUROLOGIC:  Sensory examination  revealed attenuated pin prick perception up to the knees bilaterally anteriorly, especially over the lateral aspects of his legs with intact vibratory sense.  He is walking with a walker.  His interrogation today reveals that he is not due for a refill for a while, at least for another two weeks.  He is currently getting the three boluses per day of 1.95 with infusion of 0.5 between.  IMPRESSION: 1. Increased tingling sensations in the lower extremities and numbness which    is likely exacerbation of his underlying back pain syndrome, probably    reflective of reducing his Neurontin. 2. Urinary incontinence which is multifactorial including secondary to his    urinary tract infection, but also probably likely to the opiates. 3. Other medical problems per primary care physician.  DISPOSITION: 1. Increase Neurontin to 600 mg one p.o. q.8h. #100 with three refills. 2. Continue on current medications. 3. Scheduled followup with me on the 21st or 22nd of this month for pump    refill. DD:  11/17/00 TD:  11/17/00 Job: [id number] IO/[id number]

## 2010-08-30 NOTE — Procedures (Signed)
[facility]  Patient:    [name], [name]                      MRN: [id number] Proc. Date: 09/12/99 Adm. Date:  [id number] Disc. Date: [id number] Attending:  Thyra Breed CC:         Payton Doughty, M.D.                           Procedure Report  PROCEDURE:  Intrathecal morphine trial.  DIAGNOSIS:  Failed back surgery syndrome with underlying degenerative disk disease and facet joint arthritis.  INTERVAL HISTORY:  The patient is a 45 year old gentleman who is well known to e wit a long history of back problems which date from a work related accident from November 04, 1992.  He has had three back procedures with temporary improvement, only to have his pain recur.  He has been on escalating doses of opiates to the point that he is developing nausea and vomiting and sedation at times.  I was asked to seem him by Dr. Trey Sailors, and we have tried to adjust his medications to see whether he could tolerate the opiates better.  He has not.  He is currently taking 80 mg of methadone per day and approximately 120-150 mg of morphine.  We will go ahead and give him a trial of morphine intrathecally today.  The patient was advised to reduce his opiate dose on Tuesday, two days ago, but he has gone ahead and gone off of these.  He is having some mild symptoms of withdrawal but, otherwise, is doing well.  His asthma is quiescent.  CURRENT MEDICATIONS:  Methadone 20 mg q.6h.  MSIR 30 mg, one p.o. q.6h. p.r.n.  baclofen 02 mg t.i.d.  Xanax 0.5 mg t.i.d.  Serevent p.r.n.  Albuterol p.r.n. Allegra D.  ______ 25 mg, one p.o. q.6h. p.r.n.  ALLERGIES:  AUGMENTIN and TAPE.  FAMILY HISTORY:  Positive for coronary artery disease, cancer, osteoarthritis, diabetes mellitus and COPD.  PAST SURGICAL HISTORY:  Significant for tonsillectomy, ACL repair in 1990 and back surgery x 3.  ACTIVE MEDICAL PROBLEMS:  Asthma and peptic ulcer disease.  SOCIAL HISTORY:  The patient is  a nonsmoker and nondrinker.  REVIEW OF SYSTEMS:  Please see the HPI for pertinent positives.  PHYSICAL EXAMINATION;  VITAL SIGNS:  Heart rate 96, respiratory rate 16, O2 saturations 97%, blood pressure 142/79.  GENERAL:  Pleasant male in no acute distress.  HEENT:  Normocephalic and atraumatic.  Otherwise unremarkable.  NECK:  Supple without lymphadenopathy.  LUNGS:  Clear.  HEART:  Regular rate and rhythm.  ABDOMEN:  Bowel sounds present.  Obese.  BACK:  Well healed surgical scars.  EXTREMITIES:  No clubbing, cyanosis, or edema.  NEUROLOGIC:  The patient is oriented x 4.  Cranial nerves II-XII were grossly intact.  DTR symmetric in the upper extremities.  Reflexes 2+ at the right knee, 0 at the left knee, ankles are 2+.  Motor is unchanged from previous exams.  IMPRESSION:  Failed back surgery syndrome and asthma with peptic ulcer disease er primary care physician.  PLAN:  We will give him a trial of intrathecal opiates.  DESCRIPTION OF PROCEDURE:  After informed consent was obtained, an IV was established in the left upper extremity.  Monitors were placed.  He was placed n the sitting position and his back was prepped with Betadine x 3.  A skin wheal  as raised at the L3-4 and L4-5 interspaces and I was unable to place a needle into the interspaces.  At the L2-3 interspace, a 22 gauge spinal needle was placed with ree flow in four quadrants.  Then, 0.9 mg of preservative free morphine was injected and the needle removed intact.  POST PROCEDURE CONDITION:  Stable.  PLAN:  Admit the patient for observation and monitoring post morphine intraspinal injection. DD:  09/12/99 TD:  09/16/99 Job: [id number]

## 2010-08-30 NOTE — H&P (Signed)
[place]. [facility]  Patient:    [name], [name]                      MRN: [id number] Proc. Date: 08/14/99 Adm. Date:  [id number] Attending:  Thyra Breed                         History and Physical  FOLLOW-UP EVALUATION  SUMMARY:  I spoke with [name] today about his current situation.  Over the weekend, he developed spreading nausea and vomiting, with dry heaves.  He was seen by his family care physician.  He gave him a shot of Phenergan and added Clonidine 0.1 mg one p.o. b.i.d. for five days; then advised The patient to go to one pill q.d for five days.  CURRENT MEDICATIONS: 1. Methadone 20 mg q.8h. 2. Neurontin 800 mg q.8h. 3. Baclofen as previously. 4. Serevent. 5. MSIR 30 mg q.4h. 6. Allegra. 7. Albuterol.  He feels somewhat "spaced".  He states that if he is at rest he has no discomfort, but when he gets up and moves about his pain is still level (7 out of 10).  DISPOSITION:  I encouraged him to continue on his current course of medications.  He is taking some Phenergan supplementation in addition.  I plan to see him next week.  He is scheduled to see Dr. Ubaldo Glassing on Aug 27, 1999. DD:  08/14/99 TD:  08/18/99 Job: [id number]

## 2010-08-30 NOTE — Consult Note (Signed)
[facility]  Patient:    [name], [name]                      MRN: [id number] Proc. Date: 01/20/00 Adm. Date:  [id number] Attending:  Thyra Breed CC:         Payton Doughty, M.D.   Consultation Report  FOLLOWUP EVALUATION:  [name] comes in today for reprogramming of his pump.  It is currently running at 3.5 and we will increase it to 3.9 today.  He notes that at rest, he is fairly well-controlled, but with major activities, he has severe exacerbations of his back discomfort.  He is not sleeping through the night well.  He continues on his MSIR, of which he is taking anywhere from one and a half to four tablets per day, Baclofen, Neurontin, alprazolam, Celebrex and Nexium, as well as Ditropan.  We plan to see him back in followup later this month to refill his pump.  I discussed his current situation with his case Production designer, theatre/television/film.DD:  01/20/00 TD:  01/20/00 Job: [id number]

## 2010-08-30 NOTE — Op Note (Signed)
[place]  Patient:    [name], [name]                      MRN: [id number] Proc. Date: 10/23/99 Adm. Date:  [id number] Disc. Date: [id number] Attending:  Thyra Breed                           Operative Report  PREOPERATIVE DIAGNOSIS:  Intractable pain.  POSTOPERATIVE DIAGNOSIS:  Intractable pain.  OPERATIVE PROCEDURE:  Implantation of intrathecal morphine pump.  SURGEON:  Payton Doughty, M.D.  SERVICE:  Neurosurgery.  ANESTHESIA:  General endotracheal.  PREPARATION:  Prepped with sterile Betadine prep and scrubbed with alcohol wipe.  COMPLICATIONS:  None.  INDICATION:  [age]-year-old gentleman with intractable back and leg pain.  DESCRIPTION OF PROCEDURE:  He was taken to the operating room and smoothly anesthetized and intubated and placed in the right side down/left side up lateral decubitus position.  Following shaving, prepped and draped in the usual sterile fashion and the top of the old skin incision was reopened and the spinous processes of 2-3 identified.  Attempts were made to pass a Tuohy needle; it would not pass.  Therefore, I went up to 1 and 2 and made a very careful pass with a Tuohy needle and obtained CSF without difficulty. Catheter was passed and radiographic confirmation of the descent of three levels was confirmed.  It was then anchored to the fascia and a pursestring placed around it.  It was then tunneled subcutaneously to the left upper quadrant in the abdomen where a transverse incision was made two fingerbreadths below the ribs.  The pump was implanted there and connected to the catheter.  The protective covers were sutured with 2-0 silk.  Following implantation and anchoring, all incisions were closed with successive layers of 0 Vicryl in running interrupted fashion, 3-0 Vicryl in a subcuticular fashion and 3-0 nylon in a running fashion in the skin.  Betadine and Telfa dressing were applied and made  occlusive with OpSite.  Patient was then returned to the recovery room in good condition. DD:  10/23/99 TD:  10/23/99 Job: [id number]

## 2010-08-30 NOTE — H&P (Signed)
[facility]  Patient:    [name], [name]                      MRN: [id number] Adm. Date:  [id number] Attending:  Thyra Breed CC:         Payton Doughty, M.D.   History and Physical  PROCEDURE:  Reprogramming of intraspinal opiate pump.  DIAGNOSIS:  Lumbar degenerative disk disease with postlaminectomy pain syndrome.  INTERVAL HISTORY:  The patient has required about 180 mg of MSIR per day and has recently noted increasing problems with memory, which he has attributed to his Trileptal dosing.  He states that he in general is having less well-controlled pain and does not notice when he gets a bolus.  CURRENT MEDICATIONS:  1. Trileptal 300 mg b.i.d.  2. Neurontin 400 mg q.6h.  3. Baclofen 20 mg q.6h.  4. Promethazine p.r.n.  5. Celebrex 200 mg q.12h.  6. Alprazolam 0.5 mg q.8h. p.r.n.  7. Methadone 5 mg q.8h.  8. MSIR 30 mg 1-1/2 tablets q.4-6h.  He is taking 9 tablets a day.  9. Ditropan XL. 10. Advair. 11. Proventil. 12. Nexium.  PHYSICAL EXAMINATION:  VITAL SIGNS:  Blood pressure 118/56, heart rate 84, respiratory rate 20, O2 saturation 93%.  Pain level is 9/10.  Temperature 98.7.  IMPRESSION:  The patients pump was reprogrammed to give him a bolus of 1.5 mg twice a day and he remains on his 0.5 infusion rate between.  He was also advised to increase his methadone to 5 mg 1 p.o. q.6h. and given a prescription for 120.  He was advised to reduce the Neurontin to one twice a day and continue on his current dose of Trileptal.  He is scheduled to come back later this month for refill. DD:  06/30/00 TD:  06/30/00 Job: [id number]

## 2010-08-30 NOTE — Procedures (Signed)
[facility]  Patient:    DELANO, FRATE                      MRN: [id number] Proc. Date: 04/27/00 Adm. Date:  [id number] Attending:  Thyra Breed CC:         Payton Doughty, M.D.   Procedure Report  PROCEDURE:  Evaluation of intrathecal pump with reprogramming.  DIAGNOSIS:  Post-alignment atraumatic pain syndrome, with degenerative disk disease of the lumbar spine.  INTERVAL HISTORY:  The patient has noted since the last adjustment that he is not having good pain control.  I spoke with him two days ago and advised him to restart the methadone at 5 mg twice a day, continue on the Trileptal at the current dose, and increase his Neurontin to 800 mg t.i.d.  He was advised to come on in this morning to have his pump reprogrammed.  CLINICAL NOTE:  Assessment of his pump shows that he is getting 1 mg bolus q.12h. with an infusion of 0.05 mg/hr.  The pump was reprogrammed to give him 1.2 mg bolus with ongoing 0.05 infusion between the bolus.  DISPOSITION:  He will continue on the methadone at 5 mg one p.o. q.12h. #60 with no refill.  He was given a prescription for MSIR 30 mg on p.o. q.4-6h. p.r.n. #100 with no refill.  He is to continue on his current medications and keep his next scheduled appointment.  DATA:  His vital signs today show blood pressure 135/66, heart rate 96, respiratory rate 22, O2 saturations 97%.  His pain level is 9/10. DD:  04/27/00 TD:  04/26/00 Job: [id number]

## 2010-08-30 NOTE — Procedures (Signed)
[facility]  Patient:    [name], [name]                      MRN: [id number] Proc. Date: 08/31/00 Adm. Date:  [id number] Attending:  Thyra Breed CC:         Payton Doughty, M.D.                           Procedure Report  ADDENDUM:  Because of the patients cellulitis on his hand which is improving, I went ahead and gave him cephalexin 250 mg one p.o. q. 6h #28 with no refill. He states he is not allergic to this. DD:  08/31/00 TD:  08/31/00 Job: [id number]

## 2010-08-30 NOTE — Op Note (Signed)
[facility]  Patient:    [name], [name]                      MRN: [id number] Proc. Date: 04/15/00 Adm. Date:  [id number] Attending:  Thyra Breed CC:         Payton Doughty, M.D.   Operative Report  PROCEDURE:  Evaluation of pump and reprogramming.  DIAGNOSIS:  Post laminectomy pain syndrome, failed back surgery syndrome, and chronic lumbar degenerative disk disease.  ANESTHESIOLOGIST:  Thyra Breed, M.D.  INTERVAL HISTORY:  The patient has noted that his pain continues to be rather pronounced at 7/10 despite his current pump settings and p.o. medications.  He notes he has a burning-type sensation in his lower extremities which seems to be more prominent.  He is currently on the Trileptal at 150 mg twice day, and I advised him that we easily could go ahead and go up to 300 mg b.i.d.   He is here today for a pump refill and reprogramming.  DESCRIPTION OF PROCEDURE:  He had 2 ml left in his reservoir when it was aspirated at a rate of 4.6 mg per day.  The pump was reprogrammed after it was refilled by the nurses under sterile technique set today to run at a rate of 0.9 mg over a 30 minute setting twice a day and a basilar rate of 0.025 mg per hour during the balance.  So, he will be getting boluses twice a day over 30 minutes at 7 a.m. and 7 p.m. and a continuous infusion with supplemental medicines in between.  A prescription was written for the Trileptal at 300 mg 1 p.o. b.i.d., #60 with 2 refills.  I advised him that I wanted to see him next week.  I spoke with his case manager today. DD:  04/15/00 TD:  04/15/00 Job: [id number]

## 2010-08-30 NOTE — Op Note (Signed)
NAME:  [name], [name]               ACCOUNT NO.:  [id number]   MEDICAL RECORD NO.:  [id number]          PATIENT TYPE:  AMB   LOCATION:  ENDO                         FACILITY:  [facility]   PHYSICIAN:  John C. Madilyn Fireman, M.D.    DATE OF BIRTH:  1966/03/19   DATE OF PROCEDURE:  01/16/2004  DATE OF DISCHARGE:                                 OPERATIVE REPORT   PROCEDURE:  Colonoscopy.   INDICATIONS FOR PROCEDURE:  Heme-positive stools with no source seen on EGD.   DESCRIPTION OF PROCEDURE:  The patient was placed in the left lateral  decubitus position and placed on the pulse monitor with continuous low-flow  oxygen delivered by nasal cannula.  He was sedated with 25 mcg of IV  Fentanyl and 2.5 mg of IV Versed in addition to the medicine given for the  previous EGD.  The Olympus videocolonoscope was inserted into the rectum and  advanced its entire length.  However, despite the scope being inserted its  entire length and despite multiple position changes, abdominal pressure  maneuvers, and torquing maneuvers, I was unable to reach the cecum.  It was  not clear but thought that the point of furthest advancement was somewhat  near the hepatic flexure.  This area appeared normal as did the visualized  portions of the transverse, descending, sigmoid, and rectum.  There were no  diverticula, polyps, masses, or other abnormalities noted.  The scope was  then withdrawn and the patient returned to the recovery room in stable  condition.  He tolerated the procedure well, and there were no immediate  complications.   IMPRESSION:  Normal incomplete colon to approximately the hepatic flexure.   PLAN:  Will need followup barium enema.      JCH/MEDQ  D:  01/16/2004  T:  01/16/2004  Job:  [id number]   cc:   Meredith Staggers, M.D.  [address]  [place]  [place] [address]  Fax: [phone]

## 2010-08-30 NOTE — Consult Note (Signed)
[facility]  Patient:    [name], [name]                      MRN: [id number] Proc. Date: 08/21/99 Adm. Date:  [id number] Attending:  Thyra Breed CC:         Payton Doughty, M.D.                          Consultation Report  HISTORY:  [name] comes in for a follow-up evaluation of his current medications.  Since his previous evaluation, the patient has noted a rather rocky road as we switched him from the OxyContin to the methadone.  He successfully converted to methadone with MSIR for rescue.  He is also up to 20 mg t.i.d. of is Baclofen, and continues on 800 mg q.8h. of his Neurontin.  He has intermittent dysesthesias into the lower extremities and ongoing low back discomfort.  He continues on the Celebrex and his glucosamine.  In addition he is on albuterol nd Allegra, as well as Serevent and Xanax.  He is currently taking 20 mg q.8h. of methadone.  He rates his pain as 7/10.  PHYSICAL EXAMINATION:  VITAL SIGNS:  Afebrile with a blood pressure of 158/70, heart rate 105, respirations 50, O2 saturation 93%.  Pain level is 7/10.  NEUROLOGIC:  Is unchanged from previously.  He looks a lot more comfortable.  HEENT:  He does have some reddish discoloration over his tongue.  IMPRESSION:  Low back pain, on the basis of degenerative disk disease and osteoarthritis, status post surgical intervention, with ongoing discomfort.  DISPOSITION: 1. Increase methadone to 20 mg q.6h.  He was given a prescription for methadone    5 mg four tablets q.6h., #240 with no refills. 2. Continue on MSIR for rescue pain. 3. Continue other medications as previously. 4. Follow up with me in two weeks.  In the interim he is scheduled to see    Dr. Onalee Hua L. Dellia Cloud, Ph.D.  Based on Dr. Levie Heritage evaluation we    will ascertain whether to proceed with a trial of intrathecal morphine. DD:  08/21/99 TD:  08/23/99 Job: [id number]

## 2010-09-08 NOTE — Op Note (Signed)
  NAMEMIHCAEL, LEDEE NO.:  [id number]  MEDICAL RECORD NO.:  [id number]           PATIENT TYPE:  O  LOCATION:  SDSC                         FACILITY:  [facility]  PHYSICIAN:  Loreta Ave, M.D. DATE OF BIRTH:  [birth year]  DATE OF PROCEDURE:  08/21/2010 DATE OF DISCHARGE:  08/21/2010                              OPERATIVE REPORT   PREOPERATIVE DIAGNOSES:  Right knee medial meniscus tear.  Marked exogenous obesity.  POSTOPERATIVE DIAGNOSES:  Right knee medial meniscus tear.  Marked exogenous obesity.  Radial tears of the medial and lateral meniscus. Grade 3 changes patellofemoral joint.  Grade 2 changes of medial femoral condyle.  PROCEDURES:  Right knee exam under anesthesia, arthroscopy with partial medial and lateral meniscectomies.  Chondroplasty patellofemoral joint and medial femoral condyle.  SURGEON:  Loreta Ave, MD  ASSISTANT:  Genene Churn. Barry Dienes, [place]  ANESTHESIA:  General.  BLOOD LOSS:  Minimal.  SPECIMENS:  None.  CULTURES:  None.  COMPLICATIONS:  None.  DRESSING:  Soft compressive.  TOURNIQUET:  Not employed.  PROCEDURE IN DETAIL:  The patient was brought to the operating room, placed on the operating table in supine position.  His size approaching 400 pounds weight.  All prepped.  Positioning.  Leg holder support much more difficult.  Able to get him safely positioned with the leg holder in place.  Prepped and draped in usual sterile fashion.  Two portals, one each medial and lateral parapatellar.  Arthroscope was induced. Knee distended and inspected.  Good patellofemoral tracking.  Grade 2 changes on the patella debrided.  Diffuse grade 3 on the trochlea debrided.  Medial meniscus radial tear posterior third, all the way after the rim, displaced fragments.  Posterior third removed tapered into remaining meniscus.  Lateral meniscus also had a radial tear, not extending back quite as far.  Saucerized out and tapered in  smoothly. Lateral compartment looked good.  Cruciate was intact.  Some grade 2 mild grade 3 changes of medial femoral condyle debrided.  Entire recess examined to be sure all loose fragments removed.  Instruments and fluid removed.  Portals closed with nylon. Knee injected with Depo-Medrol and Marcaine.  Sterile compressive dressing applied.  Anesthesia reversed.  Brought to the recovery room. Tolerated surgery well.  No complications.     Loreta Ave, M.D.     DFM/MEDQ  D:  08/21/2010  T:  08/22/2010  Job:  [id number]  Electronically Signed by Mckinley Jewel M.D. on 09/08/2010 11:37:07 AM

## 2011-01-14 LAB — DIFFERENTIAL
Basophils Relative: 1
Monocytes Absolute: 0.5
Monocytes Relative: 8
Neutro Abs: 3

## 2011-01-14 LAB — COMPREHENSIVE METABOLIC PANEL
Albumin: 3.7
BUN: 15
Calcium: 9.4
Creatinine, Ser: 0.94
Total Protein: 6.9

## 2011-01-14 LAB — CBC
HCT: 46.3
MCV: 79.1
Platelets: 223
RDW: 16 — ABNORMAL HIGH

## 2011-01-14 LAB — URINALYSIS, ROUTINE W REFLEX MICROSCOPIC
Bilirubin Urine: NEGATIVE
Nitrite: NEGATIVE
Specific Gravity, Urine: 1.013
pH: 7

## 2011-01-14 LAB — APTT: aPTT: 30

## 2011-07-02 DIAGNOSIS — M79609 Pain in unspecified limb: Secondary | ICD-10-CM | POA: Diagnosis not present

## 2011-11-28 DIAGNOSIS — J329 Chronic sinusitis, unspecified: Secondary | ICD-10-CM | POA: Diagnosis not present

## 2011-11-28 DIAGNOSIS — K589 Irritable bowel syndrome without diarrhea: Secondary | ICD-10-CM | POA: Diagnosis not present

## 2011-11-28 DIAGNOSIS — Z13220 Encounter for screening for lipoid disorders: Secondary | ICD-10-CM | POA: Diagnosis not present

## 2011-11-28 DIAGNOSIS — Z125 Encounter for screening for malignant neoplasm of prostate: Secondary | ICD-10-CM | POA: Diagnosis not present

## 2011-11-28 DIAGNOSIS — J45909 Unspecified asthma, uncomplicated: Secondary | ICD-10-CM | POA: Diagnosis not present

## 2011-11-28 DIAGNOSIS — E291 Testicular hypofunction: Secondary | ICD-10-CM | POA: Diagnosis not present

## 2011-11-28 DIAGNOSIS — R7309 Other abnormal glucose: Secondary | ICD-10-CM | POA: Diagnosis not present

## 2011-12-22 ENCOUNTER — Emergency Department (HOSPITAL_BASED_OUTPATIENT_CLINIC_OR_DEPARTMENT_OTHER): Payer: BC Managed Care – PPO

## 2011-12-22 ENCOUNTER — Emergency Department (HOSPITAL_BASED_OUTPATIENT_CLINIC_OR_DEPARTMENT_OTHER)
Admission: EM | Admit: 2011-12-22 | Discharge: 2011-12-22 | Disposition: A | Discharge status: Home / Self Care | Payer: BC Managed Care – PPO | Attending: Emergency Medicine | Admitting: Emergency Medicine

## 2011-12-22 ENCOUNTER — Encounter (HOSPITAL_BASED_OUTPATIENT_CLINIC_OR_DEPARTMENT_OTHER): Payer: Self-pay | Admitting: *Deleted

## 2011-12-22 DIAGNOSIS — M545 Low back pain, unspecified: Secondary | ICD-10-CM | POA: Insufficient documentation

## 2011-12-22 DIAGNOSIS — M542 Cervicalgia: Secondary | ICD-10-CM | POA: Diagnosis not present

## 2011-12-22 DIAGNOSIS — S0081XA Abrasion of other part of head, initial encounter: Secondary | ICD-10-CM

## 2011-12-22 DIAGNOSIS — IMO0002 Reserved for concepts with insufficient information to code with codable children: Secondary | ICD-10-CM | POA: Diagnosis not present

## 2011-12-22 DIAGNOSIS — Z043 Encounter for examination and observation following other accident: Secondary | ICD-10-CM | POA: Diagnosis not present

## 2011-12-22 DIAGNOSIS — Z9660 Presence of unspecified orthopedic joint implant: Secondary | ICD-10-CM | POA: Diagnosis not present

## 2011-12-22 DIAGNOSIS — S20219A Contusion of unspecified front wall of thorax, initial encounter: Secondary | ICD-10-CM | POA: Insufficient documentation

## 2011-12-22 DIAGNOSIS — K219 Gastro-esophageal reflux disease without esophagitis: Secondary | ICD-10-CM | POA: Insufficient documentation

## 2011-12-22 DIAGNOSIS — S0190XA Unspecified open wound of unspecified part of head, initial encounter: Secondary | ICD-10-CM | POA: Diagnosis not present

## 2011-12-22 DIAGNOSIS — S0990XA Unspecified injury of head, initial encounter: Secondary | ICD-10-CM | POA: Diagnosis not present

## 2011-12-22 HISTORY — DX: Dorsalgia, unspecified: M54.9

## 2011-12-22 HISTORY — DX: Gastro-esophageal reflux disease without esophagitis: K21.9

## 2011-12-22 HISTORY — DX: Reserved for concepts with insufficient information to code with codable children: IMO0002

## 2011-12-22 HISTORY — DX: Unspecified abdominal hernia without obstruction or gangrene: K46.9

## 2011-12-22 MED ORDER — OXYCODONE-ACETAMINOPHEN 5-325 MG PO TABS: 1.0000 | ORAL_TABLET | ORAL | Status: AC | PRN
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
## 2012-01-12 DIAGNOSIS — M549 Dorsalgia, unspecified: Secondary | ICD-10-CM | POA: Diagnosis not present

## 2012-01-12 DIAGNOSIS — E669 Obesity, unspecified: Secondary | ICD-10-CM | POA: Diagnosis not present

## 2012-02-02 DIAGNOSIS — R51 Headache: Secondary | ICD-10-CM | POA: Diagnosis not present

## 2012-02-02 DIAGNOSIS — H5200 Hypermetropia, unspecified eye: Secondary | ICD-10-CM | POA: Diagnosis not present

## 2012-04-22 ENCOUNTER — Ambulatory Visit (INDEPENDENT_AMBULATORY_CARE_PROVIDER_SITE_OTHER): Payer: BC Managed Care – PPO | Admitting: Sports Medicine

## 2012-04-22 VITALS — BP 146/90 | Ht 71.0 in | Wt 380.0 lb

## 2012-04-22 DIAGNOSIS — M659 Synovitis and tenosynovitis, unspecified: Secondary | ICD-10-CM

## 2012-04-22 DIAGNOSIS — M79609 Pain in unspecified limb: Secondary | ICD-10-CM

## 2012-04-22 DIAGNOSIS — M79676 Pain in unspecified toe(s): Secondary | ICD-10-CM

## 2012-04-23 NOTE — Progress Notes (Signed)
  Subjective:    Patient ID: [name], male    DOB: 03-02-1966, 47 y.o.   MRN: [id number]  HPI chief complaint: Right great toe pain  Patient comes in today complaining of 2 weeks of right great toe pain. No trauma but a rather sudden onset of pain and swelling in the right great toe. He has noticed some stiffness as well. He denies similar problems in the past. Denies a history of gout. No fevers or chills. No pain more proximally in the ankle. His pain is causing him to walk with a limp. He is a long-time patient of mine from [facility].  Surgical history is significant for a left total knee arthroplasty done by Dr. Eulah Pont. He is also in chronic pain management and has an implantable pain pump which precludes him from having an MRI. Remainder of his medical history and medications are available for review on the chart    Review of Systems     Objective:   Physical Exam Obese, no acute distress  Right foot with attention to the right great toe shows mild diffuse swelling at the first MTP joint. Diffuse tenderness to palpations. No erythema. Area is not warm to touch. Flexor and extensor tendons are intact but active and passive range of motion are limited secondary to pain and stiffness. Brisk capillary refill. Patient is walking with an obvious limp.  MSK ultrasound of the right great toe shows a joint effusion at the MTP joint. No obvious spurring. Flexor and extensor tendons are intact.       Assessment & Plan:  1. Right great toe pain secondary to first MTP synovitis 2. Morbid obesity  Under ultrasound guidance the first MTP joint was injected with a combination of 1 cc of Xylocaine without epinephrine and 1 cc of Depo-Medrol. This was done after consent was obtained and the toe was sterilely prepped. Injection was with a 25-gauge 1-1/2 inch needle and the tip of the needle was visualized under ultrasound entering the first MTP joint prior to injection. Patient  tolerated this without difficulty. He was previously constructed orthotics by Dr. Darrick Penna and 2 these orthotics I have added a simple metatarsal pad. He can remove these pads if he finds them to be uncomfortable. He will followup for ongoing or recalcitrant issues.

## 2012-07-23 DIAGNOSIS — L259 Unspecified contact dermatitis, unspecified cause: Secondary | ICD-10-CM | POA: Diagnosis not present

## 2013-06-06 DIAGNOSIS — K2100 Gastro-esophageal reflux disease with esophagitis, without bleeding: Secondary | ICD-10-CM | POA: Diagnosis not present

## 2013-06-06 DIAGNOSIS — K5901 Slow transit constipation: Secondary | ICD-10-CM | POA: Diagnosis not present

## 2013-07-19 DIAGNOSIS — M25519 Pain in unspecified shoulder: Secondary | ICD-10-CM | POA: Diagnosis not present

## 2013-07-26 DIAGNOSIS — M25569 Pain in unspecified knee: Secondary | ICD-10-CM | POA: Diagnosis not present

## 2013-07-26 DIAGNOSIS — M719 Bursopathy, unspecified: Secondary | ICD-10-CM | POA: Diagnosis not present

## 2013-07-26 DIAGNOSIS — M67919 Unspecified disorder of synovium and tendon, unspecified shoulder: Secondary | ICD-10-CM | POA: Diagnosis not present

## 2013-08-09 DIAGNOSIS — M25569 Pain in unspecified knee: Secondary | ICD-10-CM | POA: Diagnosis not present

## 2013-08-15 ENCOUNTER — Other Ambulatory Visit: Payer: Self-pay | Admitting: Physician Assistant

## 2013-08-16 ENCOUNTER — Encounter (HOSPITAL_BASED_OUTPATIENT_CLINIC_OR_DEPARTMENT_OTHER): Payer: Self-pay | Admitting: *Deleted

## 2013-08-16 NOTE — H&P (Signed)
[name]/[facility] [address]   [address] [place], [place] [phone] A Division of [facility]  Loreta Aveaniel F. Tjuana Vickrey, M.D.   Robert A. Thurston HoleWainer, M.D.   Burnell BlanksW. Dan Caffrey, M.D.   Eulas PostJoshua P. Landau, M.D.   Lunette StandsAnna Voytek, M.D Jewel Baizeimothy D. Eulah PontMurphy, M.D.  Buford DresserWesley R. Ibazebo, M.D.  Estell HarpinJames S. Kramer, M.D.    Melina Fiddlerebecca S. Bassett, M.D. Mary L. Isidoro DonningAnton, PA-C  Kirstin A. Shepperson, PA-C  Josh Smileyhadwell, PA-C [place], [place]   RE: Brian GelinasHumbert [name], [name]   [phone]      DOB: [birth year] PROGRESS NOTE: 07-26-13 Brian FearingJames is a 48 year old male who comes in with new issue. Bilateral shoulder pain. He has had this off and on up to 10 years. He was seen and evaluated for impingement both shoulders in 2007. This has been managed conservatively and tolerable but of late has gotten much worse. Because of the situation of progressive degenerative arthritis right knee he is now walking with a cane which exacerbates his right shoulder. Worse going overhead. He is starting to get rest pain and night pain and feels like he's losing motion. No new traumatic event. He is currently on disability. He was seen by Dr. Tiburcio PeaHarris who injected both shoulders a week ago. He states that with Marcaine he got a little relief but never got dramatic improvement with numbing medicine. He comes in for evaluation and treatment recommendation. No scans. Remaining history general exam is outlined included in the chart. This is significant for morbid obesity at 371 pounds. He has known progressive degenerative arthritis in the right knee after debriding arthroscopy done in 2012. He had a left total knee replacement by me in 2011. He is doing well on that side. He knows the situation with his right knee but the big issue is his weight which has been precluding our progression to total knee replacement on the right knee. I haven't seen him since 2012. General exam most significant for exogenous obesity.  5'11" 371 pounds.  EXAMINATION: He has an antalgic gait with varus thrust on the right. On the left he is fairly comfortable. Both shoulders have positive impingement positive palms down abduction overhead weakness. Passive motion reasonable on the right. On the left he lacks 20% especially with internal rotation. Active motion is much worse, not able to go overhead or bring his arms comfortably behind his back.  There is no atrophy. No instability. Soreness AC joint and over biceps tendon. He is neurovascularly intact distally.  X-RAYS: 3 views of both shoulders reveal type [name] acromion. Calcific change rotator cuff both sides. Marked degenerative changes AC joint. Glenohumeral joint and subacromial space preserved. 3 view x-rays of his left total knee replacement shows good seating and alignment of all components nothing adverse. Right knee shows moderate narrowing medial compartment not down to bone on bone.  DISPOSITION: 1. Bilateral impingement with calcific rotator cuff tendonitis. I'm going to repeat an injection to get appropriate response from Marcaine to know we've put the cortisone in  Continued  Valora Norell/[facility] [address]   [address] [place], [place] [phone] A Division of [facility]  Loreta Aveaniel F. Tyjai Charbonnet, M.D.   Robert A. Thurston HoleWainer, M.D.   Burnell BlanksW. Dan Caffrey, M.D.   Eulas PostJoshua P. Landau, M.D.   Lunette StandsAnna Voytek, M.D Jewel Baizeimothy D. Eulah PontMurphy, M.D.  Buford DresserWesley R. Ibazebo, M.D.  Estell HarpinJames S. Kramer, M.D.    Melina Fiddlerebecca S. Bassett, M.D. Mary L. Isidoro DonningAnton,  PA-C  Kirstin A. Shepperson, PA-C  Josh Laona, PA-C [place], [place]   RE: [name], [name]   [phone]      DOB: May 17, 1965 PROGRESS NOTE: 07-26-13 Page 2  the best place for him. He knows what to expect after that. We talked about a JOBE program. If symptoms persist the next step would be consideration of operative intervention. Because of a spinal cord stimulator he cannot have an MRI.  We could do an arthrogram to see if he has a full thickness tear but if it's going to come down to surgery either way I don't know if that's essential. We will see how he does. 2. In regards to his knees the left knee is doing well despite his weight. Routine follow-up in one year. Right knee is getting further progressive arthritis and other than conditioning and weight loss there's not much to forestall progression there.  PROCEDURE NOTE: The patient's clinical condition is marked by substantial pain and/or significant functional disability. Other conservative therapy has not provided relief, is contraindicated, or not appropriate. There is a reasonable likelihood that injection will significantly improve the patient's pain and/or functional disability.   Patient is seated on the exam table, both shoulders are prepped with Betadine and alcohol and injected into the subacromial interval Depo-Medrol/Marcaine.  Patient tolerates the procedure without difficulty. At least reasonable response with Marcaine in place. I will wait to hear from him.  Loreta Ave, M.D.  Electronically verified by Loreta Ave, M.D. DFM:kah D 07-27-13 T 07-28-13  Aadhya Bustamante/[facility] [address]   [address] [place], [place] [address] [phone] A Division of [facility]  Loreta Ave, M.D.   Robert A. Thurston Hole, M.D.   Burnell Blanks, M.D.   Eulas Post, M.D.   Lunette Stands, M.D. Jewel Baize. Eulah Pont, M.D.  Buford Dresser, M.D.  Estell Harpin, M.D.    Melina Fiddler, M.D. Mary L. Isidoro Donning, PA-C  Kirstin A. Shepperson, PA-C  Josh Tyrone, PA-C [place], [place]  RE: [name], [name]   [phone]      DOB: 03/26/1966 PROGRESS NOTE: 08-09-13  [name] comes in for follow-up.  Bilateral shoulder pain right greater than left.  History, workup and treatment to date thoroughly outlined in his last note by me earlier this month.  Cortisone  injections very helpful both shoulders but only lasted a few days.  It has now worn off.  Recurrent impingement symptoms.    X-rays showed some calcific rotator cuff tendonitis.  Because of a spine stimulator, we cannot do an MRI.  He would like to pursue definitive treatment.  This has been going on for almost ten years.  The injection I think at least confirms the area of diagnosis.    We had more than a 25-minute talk face-to-face concerning his dilemma.  Obviously we can't do an MRI.  I do not think an arthrogram is going to be that helpful.  We have discussed intervention on the right shoulder first.  Exam under anesthesia, arthroscopy, decompression, excision of calcific tendonitis.  Cuff repair depending on what it looks like.  I am hoping we can avoid that.  The procedure, risks, benefits and complications reviewed. Anticipated outcome outlined.  Paperwork is complete.  I will see him at the time of operative intervention.  Once he is sufficiently over the right, will address the left.      Loreta Ave, M.D.  Electronically verified by Reuel Boom  [name], M.D. DFM:gde D 08-09-13 T 08-09-13

## 2013-08-17 NOTE — Progress Notes (Signed)
Pt seen today.  Airway exam shows him to have a large neck circumference with good mouth opening and neck flexibility.  Chipped molar tooth but otherwise teeth are in good shape.  MP II airway.   He has sleep apnea but does not use any CPAP so we will plan to keep him overnight.

## 2013-08-17 NOTE — Progress Notes (Signed)
Dr. Ivin Bootyrews reviewed history, wt and would for pt to come in for airway check due to wt. Called pt at home and he is planning to come now to have anesthesia consult.

## 2013-08-18 ENCOUNTER — Ambulatory Visit (HOSPITAL_BASED_OUTPATIENT_CLINIC_OR_DEPARTMENT_OTHER)
Admission: RE | Admit: 2013-08-18 | Discharge: 2013-08-19 | Disposition: A | Discharge status: Home / Self Care | Payer: BC Managed Care – PPO | Source: Ambulatory Visit | Attending: Orthopedic Surgery | Admitting: Orthopedic Surgery

## 2013-08-18 ENCOUNTER — Ambulatory Visit (HOSPITAL_BASED_OUTPATIENT_CLINIC_OR_DEPARTMENT_OTHER): Payer: BC Managed Care – PPO | Admitting: Anesthesiology

## 2013-08-18 ENCOUNTER — Encounter (HOSPITAL_BASED_OUTPATIENT_CLINIC_OR_DEPARTMENT_OTHER)
Admission: RE | Disposition: A | Discharge status: Home / Self Care | Payer: Self-pay | Source: Ambulatory Visit | Attending: Orthopedic Surgery

## 2013-08-18 ENCOUNTER — Encounter (HOSPITAL_BASED_OUTPATIENT_CLINIC_OR_DEPARTMENT_OTHER): Payer: BC Managed Care – PPO | Admitting: Anesthesiology

## 2013-08-18 ENCOUNTER — Encounter (HOSPITAL_BASED_OUTPATIENT_CLINIC_OR_DEPARTMENT_OTHER): Payer: Self-pay | Admitting: Anesthesiology

## 2013-08-18 DIAGNOSIS — M19019 Primary osteoarthritis, unspecified shoulder: Secondary | ICD-10-CM | POA: Insufficient documentation

## 2013-08-18 DIAGNOSIS — M75120 Complete rotator cuff tear or rupture of unspecified shoulder, not specified as traumatic: Secondary | ICD-10-CM | POA: Diagnosis not present

## 2013-08-18 DIAGNOSIS — IMO0002 Reserved for concepts with insufficient information to code with codable children: Secondary | ICD-10-CM | POA: Insufficient documentation

## 2013-08-18 DIAGNOSIS — Z9889 Other specified postprocedural states: Secondary | ICD-10-CM

## 2013-08-18 DIAGNOSIS — M7530 Calcific tendinitis of unspecified shoulder: Secondary | ICD-10-CM | POA: Insufficient documentation

## 2013-08-18 DIAGNOSIS — M25519 Pain in unspecified shoulder: Secondary | ICD-10-CM | POA: Diagnosis not present

## 2013-08-18 DIAGNOSIS — M25819 Other specified joint disorders, unspecified shoulder: Secondary | ICD-10-CM | POA: Insufficient documentation

## 2013-08-18 DIAGNOSIS — M1710 Unilateral primary osteoarthritis, unspecified knee: Secondary | ICD-10-CM | POA: Insufficient documentation

## 2013-08-18 DIAGNOSIS — M7580 Other shoulder lesions, unspecified shoulder: Principal | ICD-10-CM

## 2013-08-18 DIAGNOSIS — G8918 Other acute postprocedural pain: Secondary | ICD-10-CM | POA: Diagnosis not present

## 2013-08-18 DIAGNOSIS — Z6841 Body Mass Index (BMI) 40.0 and over, adult: Secondary | ICD-10-CM | POA: Insufficient documentation

## 2013-08-18 DIAGNOSIS — M24119 Other articular cartilage disorders, unspecified shoulder: Secondary | ICD-10-CM | POA: Diagnosis not present

## 2013-08-18 HISTORY — DX: Myoneural disorder, unspecified: G70.9

## 2013-08-18 HISTORY — DX: Sleep apnea, unspecified: G47.30

## 2013-08-18 HISTORY — PX: SHOULDER ARTHROSCOPY WITH SUBACROMIAL DECOMPRESSION, ROTATOR CUFF REPAIR AND BICEP TENDON REPAIR: SHX5687

## 2013-08-18 HISTORY — DX: Nausea with vomiting, unspecified: R11.2

## 2013-08-18 HISTORY — DX: Unspecified asthma, uncomplicated: J45.909

## 2013-08-18 HISTORY — DX: Other specified postprocedural states: Z98.890

## 2013-08-18 LAB — POCT HEMOGLOBIN-HEMACUE: HEMOGLOBIN: 17.4 g/dL — AB (ref 13.0–17.0)

## 2013-08-18 SURGERY — SHOULDER ARTHROSCOPY WITH SUBACROMIAL DECOMPRESSION, ROTATOR CUFF REPAIR AND BICEP TENDON REPAIR
Anesthesia: General | Site: Shoulder | Laterality: Right

## 2013-08-18 MED ORDER — FENTANYL CITRATE 0.05 MG/ML IJ SOLN
INTRAMUSCULAR | Status: DC | PRN
  Administered 2013-08-18 (×2): 50 ug via INTRAVENOUS

## 2013-08-18 MED ORDER — ONDANSETRON HCL 4 MG/2ML IJ SOLN
INTRAMUSCULAR | Status: DC | PRN
  Administered 2013-08-18: 4 mg via INTRAVENOUS

## 2013-08-18 MED ORDER — OXYCODONE HCL 5 MG/5ML PO SOLN: 5.0000 mg | Freq: Once | ORAL | Status: AC | PRN

## 2013-08-18 MED ORDER — MIDAZOLAM HCL 2 MG/2ML IJ SOLN
INTRAMUSCULAR | Status: AC
  Filled 2013-08-18: qty 2

## 2013-08-18 MED ORDER — POTASSIUM CHLORIDE IN NACL 20-0.9 MEQ/L-% IV SOLN
INTRAVENOUS | Status: DC
  Administered 2013-08-18: 100 mL/h via INTRAVENOUS
  Filled 2013-08-18: qty 1000

## 2013-08-18 MED ORDER — METOCLOPRAMIDE HCL 5 MG/ML IJ SOLN: 10.0000 mg | Freq: Once | INTRAMUSCULAR | Status: AC | PRN

## 2013-08-18 MED ORDER — METOCLOPRAMIDE HCL 5 MG/ML IJ SOLN: 5.0000 mg | Freq: Three times a day (TID) | INTRAMUSCULAR | Status: DC | PRN

## 2013-08-18 MED ORDER — METHADONE HCL 5 MG PO TABS: 5.0000 mg | ORAL_TABLET | Freq: Three times a day (TID) | ORAL | Status: DC

## 2013-08-18 MED ORDER — HYDROMORPHONE HCL PF 1 MG/ML IJ SOLN
0.2500 mg | INTRAMUSCULAR | Status: DC | PRN
  Administered 2013-08-18 (×2): 0.5 mg via INTRAVENOUS

## 2013-08-18 MED ORDER — BUPIVACAINE HCL (PF) 0.5 % IJ SOLN
INTRAMUSCULAR | Status: AC
  Filled 2013-08-18: qty 30

## 2013-08-18 MED ORDER — BISACODYL 5 MG PO TBEC: 5.0000 mg | DELAYED_RELEASE_TABLET | Freq: Every day | ORAL | Status: DC | PRN

## 2013-08-18 MED ORDER — FENTANYL CITRATE 0.05 MG/ML IJ SOLN
INTRAMUSCULAR | Status: AC
  Filled 2013-08-18: qty 2

## 2013-08-18 MED ORDER — METOCLOPRAMIDE HCL 5 MG PO TABS: 5.0000 mg | ORAL_TABLET | Freq: Three times a day (TID) | ORAL | Status: DC | PRN

## 2013-08-18 MED ORDER — METHOCARBAMOL 1000 MG/10ML IJ SOLN: 500.0000 mg | Freq: Four times a day (QID) | INTRAVENOUS | Status: DC | PRN

## 2013-08-18 MED ORDER — LACTATED RINGERS IV SOLN
INTRAVENOUS | Status: DC
  Administered 2013-08-18: 09:00:00 via INTRAVENOUS

## 2013-08-18 MED ORDER — CEFAZOLIN SODIUM 1-5 GM-% IV SOLN
INTRAVENOUS | Status: AC
  Filled 2013-08-18: qty 50

## 2013-08-18 MED ORDER — MIDAZOLAM HCL 5 MG/5ML IJ SOLN
INTRAMUSCULAR | Status: DC | PRN
  Administered 2013-08-18: 2 mg via INTRAVENOUS

## 2013-08-18 MED ORDER — PANTOPRAZOLE SODIUM 40 MG PO TBEC: 80.0000 mg | DELAYED_RELEASE_TABLET | Freq: Every day | ORAL | Status: DC

## 2013-08-18 MED ORDER — CEFAZOLIN SODIUM-DEXTROSE 2-3 GM-% IV SOLR
INTRAVENOUS | Status: AC
  Filled 2013-08-18: qty 50

## 2013-08-18 MED ORDER — OXYCODONE-ACETAMINOPHEN 5-325 MG PO TABS: 1.0000 | ORAL_TABLET | ORAL | Status: DC | PRN

## 2013-08-18 MED ORDER — FENTANYL CITRATE 0.05 MG/ML IJ SOLN
INTRAMUSCULAR | Status: AC
  Filled 2013-08-18: qty 4

## 2013-08-18 MED ORDER — DIPHENHYDRAMINE HCL 12.5 MG/5ML PO ELIX: 12.5000 mg | ORAL_SOLUTION | ORAL | Status: DC | PRN

## 2013-08-18 MED ORDER — DEXTROSE 5 % IV SOLN
3.0000 g | INTRAVENOUS | Status: AC
  Administered 2013-08-18: 3 g via INTRAVENOUS

## 2013-08-18 MED ORDER — SODIUM CHLORIDE 0.9 % IR SOLN
Status: DC | PRN
  Administered 2013-08-18: 18000 mL

## 2013-08-18 MED ORDER — BUPIVACAINE HCL (PF) 0.25 % IJ SOLN
INTRAMUSCULAR | Status: DC | PRN
  Administered 2013-08-18: 15 mL via PERINEURAL

## 2013-08-18 MED ORDER — HYDROMORPHONE HCL PF 1 MG/ML IJ SOLN
INTRAMUSCULAR | Status: AC
  Filled 2013-08-18: qty 1

## 2013-08-18 MED ORDER — NALOXONE HCL 0.4 MG/ML IJ SOLN
INTRAMUSCULAR | Status: DC | PRN
  Administered 2013-08-18: .05 mg via INTRAVENOUS

## 2013-08-18 MED ORDER — MIDAZOLAM HCL 2 MG/2ML IJ SOLN
1.0000 mg | INTRAMUSCULAR | Status: DC | PRN
  Administered 2013-08-18: 2 mg via INTRAVENOUS

## 2013-08-18 MED ORDER — BUPIVACAINE HCL (PF) 0.25 % IJ SOLN
INTRAMUSCULAR | Status: AC
  Filled 2013-08-18: qty 30

## 2013-08-18 MED ORDER — DEXTROSE 5 % IV SOLN
3.0000 g | Freq: Four times a day (QID) | INTRAVENOUS | Status: AC
  Administered 2013-08-18 – 2013-08-19 (×3): 3 g via INTRAVENOUS

## 2013-08-18 MED ORDER — HYDROMORPHONE HCL PF 1 MG/ML IJ SOLN: 0.5000 mg | INTRAMUSCULAR | Status: DC | PRN

## 2013-08-18 MED ORDER — PROPOFOL 10 MG/ML IV BOLUS
INTRAVENOUS | Status: DC | PRN
  Administered 2013-08-18: 300 mg via INTRAVENOUS

## 2013-08-18 MED ORDER — OXYCODONE HCL 5 MG PO TABS: 5.0000 mg | ORAL_TABLET | Freq: Once | ORAL | Status: AC | PRN

## 2013-08-18 MED ORDER — FENTANYL CITRATE 0.05 MG/ML IJ SOLN
50.0000 ug | INTRAMUSCULAR | Status: DC | PRN
  Administered 2013-08-18: 50 ug via INTRAVENOUS

## 2013-08-18 MED ORDER — LACTATED RINGERS IV SOLN
INTRAVENOUS | Status: DC
  Administered 2013-08-18 (×2): via INTRAVENOUS

## 2013-08-18 MED ORDER — DEXAMETHASONE SODIUM PHOSPHATE 4 MG/ML IJ SOLN
INTRAMUSCULAR | Status: DC | PRN
  Administered 2013-08-18: 10 mg via INTRAVENOUS

## 2013-08-18 MED ORDER — GLYCOPYRROLATE 0.2 MG/ML IJ SOLN
INTRAMUSCULAR | Status: DC | PRN
  Administered 2013-08-18: 0.2 mg via INTRAVENOUS

## 2013-08-18 MED ORDER — SUCCINYLCHOLINE CHLORIDE 20 MG/ML IJ SOLN
INTRAMUSCULAR | Status: DC | PRN
  Administered 2013-08-18: 70 mg via INTRAVENOUS

## 2013-08-18 MED ORDER — METHOCARBAMOL 500 MG PO TABS: 500.0000 mg | ORAL_TABLET | Freq: Four times a day (QID) | ORAL | Status: DC | PRN

## 2013-08-18 MED ORDER — CHLORHEXIDINE GLUCONATE 4 % EX LIQD: 60.0000 mL | Freq: Once | CUTANEOUS | Status: DC

## 2013-08-18 MED ORDER — LIDOCAINE HCL (CARDIAC) 20 MG/ML IV SOLN
INTRAVENOUS | Status: DC | PRN
  Administered 2013-08-18: 60 mg via INTRAVENOUS

## 2013-08-18 MED ORDER — DOCUSATE SODIUM 100 MG PO CAPS: 100.0000 mg | ORAL_CAPSULE | Freq: Two times a day (BID) | ORAL | Status: DC

## 2013-08-18 MED ORDER — PHENYLEPHRINE HCL 10 MG/ML IJ SOLN
INTRAMUSCULAR | Status: DC | PRN
  Administered 2013-08-18: 50 ug via INTRAVENOUS

## 2013-08-18 MED ORDER — ALPRAZOLAM 0.25 MG PO TABS: 0.2500 mg | ORAL_TABLET | Freq: Three times a day (TID) | ORAL | Status: DC | PRN

## 2013-08-18 MED ORDER — PROMETHAZINE HCL 25 MG PO TABS: 25.0000 mg | ORAL_TABLET | Freq: Two times a day (BID) | ORAL | Status: DC | PRN

## 2013-08-18 MED ORDER — FLAVOCOXID 500 MG PO CAPS: 1.0000 | ORAL_CAPSULE | Freq: Every day | ORAL | Status: DC

## 2013-08-18 MED ORDER — GABAPENTIN 600 MG PO TABS: 600.0000 mg | ORAL_TABLET | Freq: Three times a day (TID) | ORAL | Status: DC

## 2013-08-18 SURGICAL SUPPLY — 77 items
ANCH SUT SWLK 19.1X5.5 CLS EL (Anchor) ×2 IMPLANT
ANCHOR PEEK SWIVEL LOCK 5.5 (Anchor) ×4 IMPLANT
APL SKNCLS STERI-STRIP NONHPOA (GAUZE/BANDAGES/DRESSINGS)
BENZOIN TINCTURE PRP APPL 2/3 (GAUZE/BANDAGES/DRESSINGS) IMPLANT
BLADE 15 SAFETY STRL DISP (BLADE) ×3 IMPLANT
BLADE CUTTER GATOR 3.5 (BLADE) ×7 IMPLANT
BLADE CUTTER MENIS 5.5 (BLADE) IMPLANT
BLADE GREAT WHITE 4.2 (BLADE) ×2 IMPLANT
BLADE GREAT WHITE 4.2MM (BLADE) ×1
BLADE VORTEX 6.0 (BLADE) ×2 IMPLANT
BUR OVAL 6.0 (BURR) ×3 IMPLANT
CANISTER SUCT 3000ML (MISCELLANEOUS) IMPLANT
CANNULA DRY DOC 8X75 (CANNULA) ×2 IMPLANT
CANNULA TWIST IN 8.25X7CM (CANNULA) IMPLANT
CLOSURE WOUND 1/2 X4 (GAUZE/BANDAGES/DRESSINGS)
DECANTER SPIKE VIAL GLASS SM (MISCELLANEOUS) IMPLANT
DRAPE STERI 35X30 U-POUCH (DRAPES) ×3 IMPLANT
DRAPE U-SHAPE 47X51 STRL (DRAPES) ×3 IMPLANT
DRAPE U-SHAPE 76X120 STRL (DRAPES) ×6 IMPLANT
DRSG PAD ABDOMINAL 8X10 ST (GAUZE/BANDAGES/DRESSINGS) ×3 IMPLANT
DURAPREP 26ML APPLICATOR (WOUND CARE) ×3 IMPLANT
ELECT MENISCUS 165MM 90D (ELECTRODE) ×5 IMPLANT
ELECT NDL TIP 2.8 STRL (NEEDLE) IMPLANT
ELECT NEEDLE TIP 2.8 STRL (NEEDLE) ×3 IMPLANT
ELECT REM PT RETURN 9FT ADLT (ELECTROSURGICAL) ×3
ELECTRODE REM PT RTRN 9FT ADLT (ELECTROSURGICAL) ×1 IMPLANT
GAUZE SPONGE 4X4 12PLY STRL (GAUZE/BANDAGES/DRESSINGS) ×6 IMPLANT
GAUZE XEROFORM 1X8 LF (GAUZE/BANDAGES/DRESSINGS) ×3 IMPLANT
GLOVE BIO SURGEON STRL SZ7 (GLOVE) ×2 IMPLANT
GLOVE BIOGEL PI IND STRL 7.0 (GLOVE) ×1 IMPLANT
GLOVE BIOGEL PI IND STRL 7.5 (GLOVE) IMPLANT
GLOVE BIOGEL PI INDICATOR 7.0 (GLOVE) ×2
GLOVE BIOGEL PI INDICATOR 7.5 (GLOVE) ×2
GLOVE ECLIPSE 6.5 STRL STRAW (GLOVE) ×3 IMPLANT
GLOVE ORTHO TXT STRL SZ7.5 (GLOVE) ×3 IMPLANT
GOWN STRL REUS W/ TWL LRG LVL3 (GOWN DISPOSABLE) ×2 IMPLANT
GOWN STRL REUS W/ TWL XL LVL3 (GOWN DISPOSABLE) ×1 IMPLANT
GOWN STRL REUS W/TWL LRG LVL3 (GOWN DISPOSABLE) ×6
GOWN STRL REUS W/TWL XL LVL3 (GOWN DISPOSABLE) ×3
IV NS IRRIG 3000ML ARTHROMATIC (IV SOLUTION) ×16 IMPLANT
MANIFOLD NEPTUNE II (INSTRUMENTS) ×3 IMPLANT
NDL SCORPION MULTI FIRE (NEEDLE) IMPLANT
NDL SUT 6 .5 CRC .975X.05 MAYO (NEEDLE) IMPLANT
NEEDLE MAYO TAPER (NEEDLE)
NEEDLE SCORPION MULTI FIRE (NEEDLE) ×3 IMPLANT
NS IRRIG 1000ML POUR BTL (IV SOLUTION) IMPLANT
PACK ARTHROSCOPY DSU (CUSTOM PROCEDURE TRAY) ×3 IMPLANT
PACK BASIN DAY SURGERY FS (CUSTOM PROCEDURE TRAY) ×3 IMPLANT
PASSER SUT SWANSON 36MM LOOP (INSTRUMENTS) IMPLANT
PENCIL BUTTON HOLSTER BLD 10FT (ELECTRODE) ×3 IMPLANT
SET ARTHROSCOPY TUBING (MISCELLANEOUS) ×3
SET ARTHROSCOPY TUBING LN (MISCELLANEOUS) ×1 IMPLANT
SLEEVE SCD COMPRESS KNEE MED (MISCELLANEOUS) ×2 IMPLANT
SLING ARM IMMOBILIZER LRG (SOFTGOODS) IMPLANT
SLING ARM IMMOBILIZER MED (SOFTGOODS) IMPLANT
SLING ARM LRG ADULT FOAM STRAP (SOFTGOODS) IMPLANT
SLING ARM MED ADULT FOAM STRAP (SOFTGOODS) IMPLANT
SLING ARM XL FOAM STRAP (SOFTGOODS) IMPLANT
SPONGE LAP 4X18 X RAY DECT (DISPOSABLE) IMPLANT
STRIP CLOSURE SKIN 1/2X4 (GAUZE/BANDAGES/DRESSINGS) IMPLANT
SUCTION FRAZIER TIP 10 FR DISP (SUCTIONS) IMPLANT
SUT ETHIBOND 2 OS 4 DA (SUTURE) IMPLANT
SUT ETHILON 2 0 FS 18 (SUTURE) IMPLANT
SUT ETHILON 3 0 PS 1 (SUTURE) ×2 IMPLANT
SUT FIBERWIRE #2 38 T-5 BLUE (SUTURE)
SUT RETRIEVER MED (INSTRUMENTS) IMPLANT
SUT TIGER TAPE 7 IN WHITE (SUTURE) ×2 IMPLANT
SUT VIC AB 0 CT1 27 (SUTURE)
SUT VIC AB 0 CT1 27XBRD ANBCTR (SUTURE) IMPLANT
SUT VIC AB 2-0 SH 27 (SUTURE)
SUT VIC AB 2-0 SH 27XBRD (SUTURE) IMPLANT
SUT VIC AB 3-0 FS2 27 (SUTURE) IMPLANT
SUTURE FIBERWR #2 38 T-5 BLUE (SUTURE) IMPLANT
TAPE FIBER 2MM 7IN #2 BLUE (SUTURE) ×2 IMPLANT
TOWEL OR 17X24 6PK STRL BLUE (TOWEL DISPOSABLE) ×3 IMPLANT
WATER STERILE IRR 1000ML POUR (IV SOLUTION) ×3 IMPLANT
YANKAUER SUCT BULB TIP NO VENT (SUCTIONS) IMPLANT

## 2013-08-18 NOTE — Anesthesia Procedure Notes (Addendum)
Anesthesia Regional Block:  Interscalene brachial plexus block  Pre-Anesthetic Checklist: ,, timeout performed, Correct Patient, Correct Site, Correct Laterality, Correct Procedure, Correct Position, site marked, Risks and benefits discussed,  Surgical consent,  Pre-op evaluation,  At surgeon's request and post-op pain management  Laterality: Right  Prep: chloraprep       Needles:   Needle Type: Other     Needle Length: 9cm 9 cm Needle Gauge: 21 and 21 G    Additional Needles:  Procedures: ultrasound guided (picture in chart) Interscalene brachial plexus block Narrative:  Start time: 08/18/2013 9:08 AM End time: 08/18/2013 9:18 AM Injection made incrementally with aspirations every 5 mL.  Performed by: Personally  Anesthesiologist: Aldona Lento Frederick, MD  Additional Notes: Ultrasound guidance used to: id relevant anatomy, confirm needle position, local anesthetic spread, avoidance of vascular puncture. Picture saved. No complications. Block performed personally by Janetta Horaharles E. Gelene MinkFrederick, MD     Procedure Name: Intubation Date/Time: 08/18/2013 10:12 AM Performed by: [place] DesanctisLINKA, Eran Mistry L Pre-anesthesia Checklist: Patient identified, Emergency Drugs available, Suction available, Patient being monitored and Timeout performed Patient Re-evaluated:Patient Re-evaluated prior to inductionOxygen Delivery Method: Circle System Utilized Preoxygenation: Pre-oxygenation with 100% oxygen Intubation Type: IV induction Ventilation: Mask ventilation without difficulty Laryngoscope Size: Mac and 3 Grade View: Grade II Tube type: Oral Tube size: 8.0 mm Number of attempts: 1 Airway Equipment and Method: stylet,  oral airway and Patient positioned with wedge pillow Placement Confirmation: ETT inserted through vocal cords under direct vision,  positive ETCO2 and breath sounds checked- equal and bilateral Secured at: 23 cm Tube secured with: Tape Dental Injury: Teeth and Oropharynx as per pre-operative  assessment  Comments: Glidescope used #3 MAC disposable blade with stylette x 1 attempt.  Easy, single-operator mask ventilation with OA.

## 2013-08-18 NOTE — Discharge Instructions (Signed)
Shouder arthroscopy, rotator cuff repair, subacromial decompression Care After Instructions  Must wear sling at all times for the next 6 weeks.  May change dressing on Sunday and replace with band-aids.  May shower on Sunday but do not soak incisions.  May apply ice for up to 20 minutes at a time for pain and swelling.  May take Percocet that we prescribe, but must not take current regimen of morphine at the same time.  Follow up appointment in our office in one week.   Refer to this sheet in the next few weeks. These discharge instructions provide you with general information on caring for yourself after you leave the hospital. Your caregiver may also give you specific instructions. Your treatment has been planned according to the most current medical practices available, but unavoidable complications sometimes occur. If you have any problems or questions after discharge, please call your caregiver. HOME INSTRUCTIONS You may resume a normal diet and activities as directed. Take showers instead of baths until informed otherwise.  Swab wounds daily with betadine.  Wash shoulder with soap and water.  Pat dry.  Cover wounds with bandaids. Only take over-the-counter or prescription medicines for pain, discomfort, or fever as directed by your caregiver.  Wear your sling for the next 6 weeks unless otherwise instructed. Eat a well-balanced diet.  Avoid lifting or driving until you are instructed otherwise.  Make an appointment to see your caregiver for stitches (suture) or staple removal as directed.   SEEK MEDICAL CARE IF: You have swelling of your calf or leg.  You develop shortness of breath or chest pain.  You have redness, swelling, or increasing pain in the wound.  There is pus or any unusual drainage coming from the surgical site.  You notice a bad smell coming from the surgical site or dressing.  The surgical site breaks open after sutures or staples have been removed.  There is persistent  bleeding from the suture or staple line.  You are getting worse or are not improving.  You have any other questions or concerns.  SEEK IMMEDIATE MEDICAL CARE IF:  You have a fever greater than 101 You develop a rash.  You have difficulty breathing.  You develop any reaction or side effects to medicines given.  Your knee motion is decreasing rather than improving.  MAKE SURE YOU:  Understand these instructions.  Will watch your condition.  Will get help right away if you are not doing well or get worse.    Post Anesthesia Home Care Instructions  Activity: Get plenty of rest for the remainder of the day. A responsible adult should stay with you for 24 hours following the procedure.  For the next 24 hours, DO NOT: -Drive a car -Advertising copywriterperate machinery -Drink alcoholic beverages -Take any medication unless instructed by your physician -Make any legal decisions or sign important papers.  Meals: Start with liquid foods such as gelatin or soup. Progress to regular foods as tolerated. Avoid greasy, spicy, heavy foods. If nausea and/or vomiting occur, drink only clear liquids until the nausea and/or vomiting subsides. Call your physician if vomiting continues.  Special Instructions/Symptoms: Your throat may feel dry or sore from the anesthesia or the breathing tube placed in your throat during surgery. If this causes discomfort, gargle with warm salt water. The discomfort should disappear within 24 hours. Regional Anesthesia Blocks  1. Numbness or the inability to move the "blocked" extremity may last from 3-48 hours after placement. The length of time depends on  the medication injected and your individual response to the medication. If the numbness is not going away after 48 hours, call your surgeon.  2. The extremity that is blocked will need to be protected until the numbness is gone and the  Strength has returned. Because you cannot feel it, you will need to take extra care to avoid injury.  Because it may be weak, you may have difficulty moving it or using it. You may not know what position it is in without looking at it while the block is in effect.  3. For blocks in the legs and feet, returning to weight bearing and walking needs to be done carefully. You will need to wait until the numbness is entirely gone and the strength has returned. You should be able to move your leg and foot normally before you try and bear weight or walk. You will need someone to be with you when you first try to ensure you do not fall and possibly risk injury.  4. Bruising and tenderness at the needle site are common side effects and will resolve in a few days.  5. Persistent numbness or new problems with movement should be communicated to the surgeon or the [facility] [phone] [facility] [phone]).

## 2013-08-18 NOTE — Progress Notes (Signed)
Assisted Dr. Frederick with right, ultrasound guided, interscalene  block. Side rails up, monitors on throughout procedure. See vital signs in flow sheet. Tolerated Procedure well. 

## 2013-08-18 NOTE — Anesthesia Postprocedure Evaluation (Signed)
  Anesthesia Post-op Note  Patient: [name]  Procedure(s) Performed: Procedure(s) with comments: RIGHT SHOULDER ARTHROSCOPY WITH SUBACROMIAL DECOMPRESSION/PARTIAL ACROMIOPLASTY WITH CORACOACROMIAL RELEASE/DISTAL CLAVICULECTOMY/ ROTATOR CUFF REPAIR/DEBRIDEMENT EXTENTSIVE (Right) - ANESTHESIA: GENERAL, PRE/POST OP SCALENE  Patient Location: PACU  Anesthesia Type:GA combined with regional for post-op pain  Level of Consciousness: awake and alert   Airway and Oxygen Therapy: Patient Spontanous Breathing  Post-op Pain: mild  Post-op Assessment: Post-op Vital signs reviewed  Post-op Vital Signs: Reviewed  Last Vitals:  Filed Vitals:   08/18/13 1345  BP:   Pulse: 86  Temp:   Resp: 19    Complications: No apparent anesthesia complications

## 2013-08-18 NOTE — Transfer of Care (Signed)
Immediate Anesthesia Transfer of Care Note  Patient: [name]  Procedure(s) Performed: Procedure(s) with comments: RIGHT SHOULDER ARTHROSCOPY WITH SUBACROMIAL DECOMPRESSION/PARTIAL ACROMIOPLASTY WITH CORACOACROMIAL RELEASE/DISTAL CLAVICULECTOMY/ ROTATOR CUFF REPAIR/DEBRIDEMENT EXTENTSIVE (Right) - ANESTHESIA: GENERAL, PRE/POST OP SCALENE  Patient Location: PACU  Anesthesia Type:GA combined with regional for post-op pain  Level of Consciousness: awake, alert  and patient cooperative  Airway & Oxygen Therapy: Patient Spontanous Breathing and Patient connected to face mask oxygen  Post-op Assessment: Report given to PACU RN and Post -op Vital signs reviewed and stable  Post vital signs: Reviewed and stable  Complications: No apparent anesthesia complications

## 2013-08-18 NOTE — Anesthesia Preprocedure Evaluation (Signed)
Anesthesia Evaluation  Patient identified by MRN, date of birth, ID band Patient awake    Reviewed: Allergy & Precautions, H&P , NPO status , Patient's Chart, lab work & pertinent test results, reviewed documented beta blocker date and time   History of Anesthesia Complications (+) PONV and history of anesthetic complications  Airway Mallampati: II TM Distance: >3 FB Neck ROM: full    Dental   Pulmonary asthma , sleep apnea ,  breath sounds clear to auscultation        Cardiovascular negative cardio ROS  Rhythm:regular     Neuro/Psych  Neuromuscular disease negative psych ROS   GI/Hepatic Neg liver ROS, GERD-  Medicated and Controlled,  Endo/Other  negative endocrine ROS  Renal/GU negative Renal ROS  negative genitourinary   Musculoskeletal   Abdominal   Peds  Hematology negative hematology ROS (+)   Anesthesia Other Findings See surgeon's H&P   Reproductive/Obstetrics negative OB ROS                           Anesthesia Physical Anesthesia Plan  ASA: III  Anesthesia Plan: General   Post-op Pain Management:    Induction: Intravenous  Airway Management Planned: Oral ETT  Additional Equipment:   Intra-op Plan:   Post-operative Plan: Extubation in OR  Informed Consent: I have reviewed the patients History and Physical, chart, labs and discussed the procedure including the risks, benefits and alternatives for the proposed anesthesia with the patient or authorized representative who has indicated his/her understanding and acceptance.   Dental Advisory Given  Plan Discussed with: CRNA and Surgeon  Anesthesia Plan Comments:         Anesthesia Quick Evaluation

## 2013-08-18 NOTE — Interval H&P Note (Signed)
History and Physical Interval Note:  08/18/2013 7:26 AM  [name]  has presented today for surgery, with the diagnosis of RIGHT SHOULDER IMPINGEMENT SYNDROME, DISORDER ARTICULAR CARTILAGE - SHOULDER, BICIPITAL  The various methods of treatment have been discussed with the patient and family. After consideration of risks, benefits and other options for treatment, the patient has consented to  Procedure(s) with comments: RIGHT SHOULDER ARTHROSCOPY WITH SUBACROMIAL DECOMPRESSION/PARTIAL ACROMIOPLASTY WITH CORACOACROMIAL RELEASE/DISTAL CLAVICULECTOMY/ ROTATOR CUFF REPAIR/DEBRIDEMENT EXTENTSIVE (Right) - ANESTHESIA: GENERAL, PRE/POST OP SCALENE as a surgical intervention .  The patient's history has been reviewed, patient examined, no change in status, stable for surgery.  I have reviewed the patient's chart and labs.  Questions were answered to the patient's satisfaction.     Loreta Aveaniel F Murphy

## 2013-08-19 ENCOUNTER — Encounter (HOSPITAL_BASED_OUTPATIENT_CLINIC_OR_DEPARTMENT_OTHER): Payer: Self-pay | Admitting: Orthopedic Surgery

## 2013-08-19 MED ORDER — CEFAZOLIN SODIUM 1-5 GM-% IV SOLN
INTRAVENOUS | Status: AC
  Filled 2013-08-19: qty 50

## 2013-08-19 MED ORDER — CEFAZOLIN SODIUM-DEXTROSE 2-3 GM-% IV SOLR
INTRAVENOUS | Status: AC
  Filled 2013-08-19: qty 50

## 2013-08-22 NOTE — Op Note (Signed)
[place]:  Marinez, Hao               ACCOUNT NO.:  [id number]  MEDICAL RECORD NO.:  [id number]  LOCATION:                                 FACILITY:  PHYSICIAN:  Loreta Aveaniel F. Joane Postel, M.D. DATE OF BIRTH:  September 20, 1965  DATE OF PROCEDURE:  08/18/2013 DATE OF DISCHARGE:  08/18/2013                              OPERATIVE REPORT   PREOPERATIVE DIAGNOSES: 1. Right shoulder chronic impingement and calcific rotator cuff     tendinitis, degenerative joint disease, acromioclavicular joint. 2. Morbid obesity, weighing greater than 158 kg.  BMI in excess of 55.  PROCEDURE: 1. Exam under anesthesia, arthroscopy. 2. Debridement of complex labral tears. 3. Debridement of rotator cuff with excision of calcific deposits,     fluoroscopic guidance. 4. Bursectomy. 5. Acromioplasty. 6. Coracoacromial ligament release.  Excision of distal clavicle. 7. Arthroscopic-assisted rotator cuff repair.  FiberWire suture x2,     SwiveLock anchors x2.  DESCRIPTION OF PROCEDURE:  [name] was brought to the operating room today on outpatient.  __________.  General anesthesia.  Odelia GageLindsey Anton assisted throughout, necessary for timely completion of procedure. Morbid obesity making everything much more difficult in regard to positioning and the entire procedure.  __________ full motion, stable shoulder.  At arthroscopy, first I had a very difficult time getting into his shoulder because of his size.  I was able to access this. Complex tearing of the labrum debrided.  Biceps tendon and biceps anchor intact.  Articular cartilage did not look bad.  A lot of tearing throughout the __________ region supraspinatus, but I did not see a full- thickness tear from there.  From above, I was able to isolate the calcium deposits with fluoroscopic and arthroscopic guidance.  This was completely decompressed with Barbotage and with a shaver.  Once that was done, the entire __________ region was markedly paper thin because of the  calcium deposits.  This was mobilized, debrided, and tuberosity roughened and repair definitely indicated.  Acromioplasty from type 3 to type 1 acromion releasing the CA ligament.  Grade 4 changes in the acromioclavicular joint.  Periarticular spurs in lateral segment of clavicle resected.  I then took the mobilized cuff with a cannula laterally.  Two horizontal mattress sutures with FiberWire suture.  Two swivel lock anchors.  Nice firm watertight closure achieved.  Attempts to look at complete excision of calcific deposit at the end, I really could not do with the mini C-arm as I could not get enough penetration because of his size.  I was fairly confident this was debrided and repaired.  Tolerated the surgery well.  Overnight observation for pain control and also monitoring him because of his morbid obesity and sleep apnea.  He will follow up also in a week.  __________ therapy per protocol and cuff repair.  Good enough to use the sling for at least 4 weeks and then careful until 6 weeks.  Percocet for pain.  He can only use a Percocet if he is not using his other high-power narcotics that he uses for his back.     Loreta Aveaniel F. Starlit Raburn, M.D.   ______________________________ Loreta Aveaniel F. Owenn Rothermel, M.D.    DFM/MEDQ  D:  08/18/2013  T:  08/19/2013  Job:  [id number]

## 2013-08-26 DIAGNOSIS — Z4789 Encounter for other orthopedic aftercare: Secondary | ICD-10-CM | POA: Diagnosis not present

## 2013-09-02 DIAGNOSIS — M75120 Complete rotator cuff tear or rupture of unspecified shoulder, not specified as traumatic: Secondary | ICD-10-CM | POA: Diagnosis not present

## 2013-09-07 DIAGNOSIS — M75120 Complete rotator cuff tear or rupture of unspecified shoulder, not specified as traumatic: Secondary | ICD-10-CM | POA: Diagnosis not present

## 2013-09-09 DIAGNOSIS — M75120 Complete rotator cuff tear or rupture of unspecified shoulder, not specified as traumatic: Secondary | ICD-10-CM | POA: Diagnosis not present

## 2013-09-14 DIAGNOSIS — M75120 Complete rotator cuff tear or rupture of unspecified shoulder, not specified as traumatic: Secondary | ICD-10-CM | POA: Diagnosis not present

## 2013-09-16 DIAGNOSIS — M75120 Complete rotator cuff tear or rupture of unspecified shoulder, not specified as traumatic: Secondary | ICD-10-CM | POA: Diagnosis not present

## 2013-09-21 DIAGNOSIS — M75120 Complete rotator cuff tear or rupture of unspecified shoulder, not specified as traumatic: Secondary | ICD-10-CM | POA: Diagnosis not present

## 2013-09-26 ENCOUNTER — Other Ambulatory Visit: Payer: Self-pay | Admitting: Physician Assistant

## 2013-09-26 DIAGNOSIS — M75120 Complete rotator cuff tear or rupture of unspecified shoulder, not specified as traumatic: Secondary | ICD-10-CM | POA: Diagnosis not present

## 2013-09-30 ENCOUNTER — Encounter (HOSPITAL_BASED_OUTPATIENT_CLINIC_OR_DEPARTMENT_OTHER): Payer: Self-pay | Admitting: *Deleted

## 2013-09-30 NOTE — Progress Notes (Signed)
Pt was here 5/15 for rt shoulder-stayed rcc-did well-no labs needed

## 2013-10-03 DIAGNOSIS — M75120 Complete rotator cuff tear or rupture of unspecified shoulder, not specified as traumatic: Secondary | ICD-10-CM | POA: Diagnosis not present

## 2013-10-05 DIAGNOSIS — M75120 Complete rotator cuff tear or rupture of unspecified shoulder, not specified as traumatic: Secondary | ICD-10-CM | POA: Diagnosis not present

## 2013-10-05 NOTE — H&P (Signed)
[facility] [address]   [address] [place], [place] [phone] A Division of [facility]  Loreta Aveaniel F. Murphy, M.D.   Robert A. Thurston HoleWainer, M.D.   Burnell BlanksW. Dan Caffrey, M.D.   Eulas PostJoshua P. Landau, M.D.   Lunette StandsAnna Voytek, M.D Jewel Baizeimothy D. Eulah PontMurphy, M.D.  Buford DresserWesley R. Ibazebo, M.D.  Estell HarpinJames S. Kramer, M.D.    Melina Fiddlerebecca S. Bassett, M.D. Mary L. Isidoro DonningAnton, PA-C  Kirstin A. Shepperson, PA-C  Josh Sweetwaterhadwell, PA-C [place]andon Parry, [place]   RE: Brian GelinasHumbert [name], [name]   [phone]      DOB: [birth year] PROGRESS NOTE: 07-26-13 Brian FearingJames is a 48 year old male who comes in with new issue. Bilateral shoulder pain. He has had this off and on up to 10 years. He was seen and evaluated for impingement both shoulders in 2007. This has been managed conservatively and tolerable but of late has gotten much worse. Because of the situation of progressive degenerative arthritis right knee he is now walking with a cane which exacerbates his right shoulder. Worse going overhead. He is starting to get rest pain and night pain and feels like he's losing motion. No new traumatic event. He is currently on disability. He was seen by Dr. Tiburcio PeaHarris who injected both shoulders a week ago. He states that with Marcaine he got a little relief but never got dramatic improvement with numbing medicine. He comes in for evaluation and treatment recommendation. No scans. Remaining history general exam is outlined included in the chart. This is significant for morbid obesity at 371 pounds. He has known progressive degenerative arthritis in the right knee after debriding arthroscopy done in 2012. He had a left total knee replacement by me in 2011. He is doing well on that side. He knows the situation with his right knee but the big issue is his weight which has been precluding our progression to total knee replacement on the right knee. I haven't seen him since 2012. General exam most significant for exogenous obesity.  5'11" 371 pounds.  EXAMINATION: He has an antalgic gait with varus thrust on the right. On the left he is fairly comfortable. Both shoulders have positive impingement positive palms down abduction overhead weakness. Passive motion reasonable on the right. On the left he lacks 20% especially with internal rotation. Active motion is much worse, not able to go overhead or bring his arms comfortably behind his back.  There is no atrophy. No instability. Soreness AC joint and over biceps tendon. He is neurovascularly intact distally.  X-RAYS: 3 views of both shoulders reveal type [name] acromion. Calcific change rotator cuff both sides. Marked degenerative changes AC joint. Glenohumeral joint and subacromial space preserved. 3 view x-rays of his left total knee replacement shows good seating and alignment of all components nothing adverse. Right knee shows moderate narrowing medial compartment not down to bone on bone.  DISPOSITION: 1. Bilateral impingement with calcific rotator cuff tendonitis. I'm going to repeat an injection to get appropriate response from Marcaine to know we've put the cortisone in  Continued  [facility] [address]   [address] [place], [place] [phone] A Division of [facility]  Loreta Aveaniel F. Murphy, M.D.   Robert A. Thurston HoleWainer, M.D.   Burnell BlanksW. Dan Caffrey, M.D.   Eulas PostJoshua P. Landau, M.D.   Lunette StandsAnna Voytek, M.D Jewel Baizeimothy D. Eulah PontMurphy, M.D.  Buford DresserWesley R. Ibazebo, M.D.  Estell HarpinJames S. Kramer, M.D.    Melina Fiddlerebecca S. Bassett, M.D. Mary L. Isidoro DonningAnton,  PA-C  Kirstin A. Shepperson, PA-C  Josh Reynoldshadwell, PA-C Phil CampbellBrandon Parry, [place]   RE: Brian GelinasHumbert [name], [name]   [phone]      DOB: Apr 30, 1965 PROGRESS NOTE: 07-26-13 Page 2  the best place for him. He knows what to expect after that. We talked about a JOBE program. If symptoms persist the next step would be consideration of operative intervention. Because of a spinal cord stimulator he cannot have an MRI.  We could do an arthrogram to see if he has a full thickness tear but if it's going to come down to surgery either way I don't know if that's essential. We will see how he does. 2. In regards to his knees the left knee is doing well despite his weight. Routine follow-up in one year. Right knee is getting further progressive arthritis and other than conditioning and weight loss there's not much to forestall progression there.  PROCEDURE NOTE: The patient's clinical condition is marked by substantial pain and/or significant functional disability. Other conservative therapy has not provided relief, is contraindicated, or not appropriate. There is a reasonable likelihood that injection will significantly improve the patient's pain and/or functional disability.   Patient is seated on the exam table, both shoulders are prepped with Betadine and alcohol and injected into the subacromial interval Depo-Medrol/Marcaine.  Patient tolerates the procedure without difficulty. At least reasonable response with Marcaine in place. I will wait to hear from him.  Loreta Aveaniel F. Murphy, M.D.  Electronically verified by Loreta Aveaniel F. Murphy, M.D. DFM:kah D 07-27-13 T 07-28-13  [facility] [address]   [address] [place], [place] [phone] A Division of [facility]  Loreta Aveaniel F. Murphy, M.D.   Robert A. Thurston HoleWainer, M.D.   Burnell BlanksW. Dan Caffrey, M.D.   Eulas PostJoshua P. Landau, M.D.   Lunette StandsAnna Voytek, M.D Jewel Baizeimothy D. Eulah PontMurphy, M.D.  Buford DresserWesley R. Ibazebo, M.D.  Estell HarpinJames S. Kramer, M.D.    Melina Fiddlerebecca S. Bassett, M.D. Mary L. Isidoro DonningAnton, PA-C  Kirstin A. Shepperson, PA-C  Josh [place]hadwell, PA-C [place]andon Parry, [place]   RE: Brian GelinasHumbert [name], Maliq                                [phone]      DOB: Apr 30, 1965 PROGRESS NOTE: 09-23-13 Brian FearingJames comes in for follow up.  He is four weeks out from debridement, decompression and cuff repair, right shoulder.  Surgery by me on Aug 18, 2013.  Doing well.  Out of his  sling.  Progressing with therapy.  Already very good motion.  Good pain control.  I saw him today to see if he is improved enough on the right that we can do the same procedure on the left.  By my assessment today I think he has made enough gains with comfort and function that we can indeed proceed with the left.  We have discussed that.  Exam under anesthesia, arthroscopy, excision calcific deposit with possible cuff repair.  Same thing we did on the right.  Paperwork complete.  All questions answered.  Continue his exercise program on the right.  See him at the time of operative intervention on the left.    Loreta Aveaniel F. Murphy, M.D.   Electronically verified by Loreta Aveaniel F. Murphy, M.D. DFM:jjh D 09-23-13 T 09-26-13

## 2013-10-06 ENCOUNTER — Ambulatory Visit (HOSPITAL_BASED_OUTPATIENT_CLINIC_OR_DEPARTMENT_OTHER)
Admission: RE | Admit: 2013-10-06 | Discharge: 2013-10-07 | Disposition: A | Discharge status: Home / Self Care | Payer: BC Managed Care – PPO | Source: Ambulatory Visit | Attending: Orthopedic Surgery | Admitting: Orthopedic Surgery

## 2013-10-06 ENCOUNTER — Encounter (HOSPITAL_BASED_OUTPATIENT_CLINIC_OR_DEPARTMENT_OTHER)
Admission: RE | Disposition: A | Discharge status: Home / Self Care | Payer: Self-pay | Source: Ambulatory Visit | Attending: Orthopedic Surgery

## 2013-10-06 ENCOUNTER — Encounter (HOSPITAL_BASED_OUTPATIENT_CLINIC_OR_DEPARTMENT_OTHER): Payer: Self-pay

## 2013-10-06 ENCOUNTER — Encounter (HOSPITAL_BASED_OUTPATIENT_CLINIC_OR_DEPARTMENT_OTHER): Payer: BC Managed Care – PPO | Admitting: Anesthesiology

## 2013-10-06 ENCOUNTER — Ambulatory Visit (HOSPITAL_BASED_OUTPATIENT_CLINIC_OR_DEPARTMENT_OTHER): Payer: BC Managed Care – PPO | Admitting: Anesthesiology

## 2013-10-06 DIAGNOSIS — M949 Disorder of cartilage, unspecified: Secondary | ICD-10-CM | POA: Diagnosis not present

## 2013-10-06 DIAGNOSIS — Z96659 Presence of unspecified artificial knee joint: Secondary | ICD-10-CM | POA: Insufficient documentation

## 2013-10-06 DIAGNOSIS — Z6841 Body Mass Index (BMI) 40.0 and over, adult: Secondary | ICD-10-CM | POA: Diagnosis not present

## 2013-10-06 DIAGNOSIS — M1710 Unilateral primary osteoarthritis, unspecified knee: Secondary | ICD-10-CM | POA: Insufficient documentation

## 2013-10-06 DIAGNOSIS — K219 Gastro-esophageal reflux disease without esophagitis: Secondary | ICD-10-CM | POA: Insufficient documentation

## 2013-10-06 DIAGNOSIS — M7580 Other shoulder lesions, unspecified shoulder: Secondary | ICD-10-CM

## 2013-10-06 DIAGNOSIS — M67919 Unspecified disorder of synovium and tendon, unspecified shoulder: Secondary | ICD-10-CM | POA: Diagnosis not present

## 2013-10-06 DIAGNOSIS — M25519 Pain in unspecified shoulder: Secondary | ICD-10-CM | POA: Diagnosis not present

## 2013-10-06 DIAGNOSIS — Z9889 Other specified postprocedural states: Secondary | ICD-10-CM

## 2013-10-06 DIAGNOSIS — G4730 Sleep apnea, unspecified: Secondary | ICD-10-CM | POA: Diagnosis not present

## 2013-10-06 DIAGNOSIS — M719 Bursopathy, unspecified: Principal | ICD-10-CM | POA: Insufficient documentation

## 2013-10-06 DIAGNOSIS — M19019 Primary osteoarthritis, unspecified shoulder: Secondary | ICD-10-CM | POA: Diagnosis not present

## 2013-10-06 DIAGNOSIS — M25819 Other specified joint disorders, unspecified shoulder: Secondary | ICD-10-CM | POA: Diagnosis not present

## 2013-10-06 DIAGNOSIS — G8918 Other acute postprocedural pain: Secondary | ICD-10-CM | POA: Diagnosis not present

## 2013-10-06 DIAGNOSIS — M899 Disorder of bone, unspecified: Secondary | ICD-10-CM | POA: Insufficient documentation

## 2013-10-06 DIAGNOSIS — M75120 Complete rotator cuff tear or rupture of unspecified shoulder, not specified as traumatic: Secondary | ICD-10-CM | POA: Diagnosis not present

## 2013-10-06 HISTORY — DX: Presence of spectacles and contact lenses: Z97.3

## 2013-10-06 HISTORY — PX: SHOULDER ARTHROSCOPY WITH SUBACROMIAL DECOMPRESSION AND BICEP TENDON REPAIR: SHX5689

## 2013-10-06 SURGERY — SHOULDER ARTHROSCOPY WITH SUBACROMIAL DECOMPRESSION AND BICEP TENDON REPAIR
Anesthesia: Regional | Site: Shoulder | Laterality: Left

## 2013-10-06 MED ORDER — OXYCODONE HCL 5 MG PO TABS: 5.0000 mg | ORAL_TABLET | Freq: Once | ORAL | Status: DC | PRN

## 2013-10-06 MED ORDER — METHOCARBAMOL 500 MG PO TABS
500.0000 mg | ORAL_TABLET | Freq: Four times a day (QID) | ORAL | Status: DC | PRN
  Administered 2013-10-06: 500 mg via ORAL

## 2013-10-06 MED ORDER — OXYCODONE-ACETAMINOPHEN 5-325 MG PO TABS: 1.0000 | ORAL_TABLET | ORAL | Status: DC | PRN

## 2013-10-06 MED ORDER — MIDAZOLAM HCL 2 MG/2ML IJ SOLN
1.0000 mg | INTRAMUSCULAR | Status: DC | PRN
  Administered 2013-10-06: 2 mg via INTRAVENOUS

## 2013-10-06 MED ORDER — DOCUSATE SODIUM 100 MG PO CAPS: 100.0000 mg | ORAL_CAPSULE | Freq: Two times a day (BID) | ORAL | Status: DC

## 2013-10-06 MED ORDER — CEFAZOLIN SODIUM 1-5 GM-% IV SOLN
INTRAVENOUS | Status: AC
  Filled 2013-10-06: qty 50

## 2013-10-06 MED ORDER — ALPRAZOLAM 0.25 MG PO TABS: 0.2500 mg | ORAL_TABLET | Freq: Three times a day (TID) | ORAL | Status: DC | PRN

## 2013-10-06 MED ORDER — CHLORHEXIDINE GLUCONATE 4 % EX LIQD: 60.0000 mL | Freq: Once | CUTANEOUS | Status: DC

## 2013-10-06 MED ORDER — PROPOFOL 10 MG/ML IV BOLUS
INTRAVENOUS | Status: DC | PRN
  Administered 2013-10-06: 200 mg via INTRAVENOUS

## 2013-10-06 MED ORDER — SCOPOLAMINE 1 MG/3DAYS TD PT72
1.0000 | MEDICATED_PATCH | TRANSDERMAL | Status: DC
  Administered 2013-10-06: 1.5 mg via TRANSDERMAL

## 2013-10-06 MED ORDER — DEXAMETHASONE SODIUM PHOSPHATE 4 MG/ML IJ SOLN
INTRAMUSCULAR | Status: DC | PRN
  Administered 2013-10-06: 10 mg via INTRAVENOUS

## 2013-10-06 MED ORDER — ONDANSETRON HCL 4 MG PO TABS: 4.0000 mg | ORAL_TABLET | Freq: Four times a day (QID) | ORAL | Status: DC | PRN

## 2013-10-06 MED ORDER — LACTATED RINGERS IV SOLN
INTRAVENOUS | Status: DC
  Administered 2013-10-06: 12:00:00 via INTRAVENOUS

## 2013-10-06 MED ORDER — CEFAZOLIN SODIUM-DEXTROSE 2-3 GM-% IV SOLR: 2.0000 g | Freq: Four times a day (QID) | INTRAVENOUS | Status: DC

## 2013-10-06 MED ORDER — GABAPENTIN 600 MG PO TABS
600.0000 mg | ORAL_TABLET | Freq: Three times a day (TID) | ORAL | Status: DC
  Administered 2013-10-06 (×2): 600 mg via ORAL

## 2013-10-06 MED ORDER — ONDANSETRON HCL 4 MG/2ML IJ SOLN: 4.0000 mg | Freq: Four times a day (QID) | INTRAMUSCULAR | Status: DC | PRN

## 2013-10-06 MED ORDER — METHOCARBAMOL 1000 MG/10ML IJ SOLN: 500.0000 mg | Freq: Four times a day (QID) | INTRAVENOUS | Status: DC | PRN

## 2013-10-06 MED ORDER — METOCLOPRAMIDE HCL 5 MG PO TABS
5.0000 mg | ORAL_TABLET | Freq: Three times a day (TID) | ORAL | Status: DC | PRN
Start: 2013-10-06 — End: 2013-10-07

## 2013-10-06 MED ORDER — CEFAZOLIN SODIUM-DEXTROSE 2-3 GM-% IV SOLR
INTRAVENOUS | Status: AC
  Filled 2013-10-06: qty 50

## 2013-10-06 MED ORDER — ONDANSETRON HCL 4 MG/2ML IJ SOLN
INTRAMUSCULAR | Status: DC | PRN
  Administered 2013-10-06: 4 mg via INTRAVENOUS

## 2013-10-06 MED ORDER — FENTANYL CITRATE 0.05 MG/ML IJ SOLN
50.0000 ug | INTRAMUSCULAR | Status: DC | PRN
  Administered 2013-10-06: 100 ug via INTRAVENOUS

## 2013-10-06 MED ORDER — POTASSIUM CHLORIDE IN NACL 20-0.9 MEQ/L-% IV SOLN: INTRAVENOUS | Status: DC

## 2013-10-06 MED ORDER — PROMETHAZINE HCL 25 MG PO TABS: 25.0000 mg | ORAL_TABLET | Freq: Two times a day (BID) | ORAL | Status: DC | PRN

## 2013-10-06 MED ORDER — FENTANYL CITRATE 0.05 MG/ML IJ SOLN
INTRAMUSCULAR | Status: AC
  Filled 2013-10-06: qty 2

## 2013-10-06 MED ORDER — BISACODYL 5 MG PO TBEC: 5.0000 mg | DELAYED_RELEASE_TABLET | Freq: Every day | ORAL | Status: DC | PRN

## 2013-10-06 MED ORDER — METOCLOPRAMIDE HCL 5 MG PO TABS: 5.0000 mg | ORAL_TABLET | Freq: Three times a day (TID) | ORAL | Status: DC | PRN

## 2013-10-06 MED ORDER — METOCLOPRAMIDE HCL 5 MG/ML IJ SOLN: 5.0000 mg | Freq: Three times a day (TID) | INTRAMUSCULAR | Status: DC | PRN

## 2013-10-06 MED ORDER — METHADONE HCL 5 MG PO TABS: 5.0000 mg | ORAL_TABLET | Freq: Three times a day (TID) | ORAL | Status: DC

## 2013-10-06 MED ORDER — CEFAZOLIN SODIUM-DEXTROSE 2-3 GM-% IV SOLR
2.0000 g | Freq: Four times a day (QID) | INTRAVENOUS | Status: DC
  Administered 2013-10-06 – 2013-10-07 (×2): 2 g via INTRAVENOUS

## 2013-10-06 MED ORDER — SODIUM CHLORIDE 0.9 % IR SOLN
Status: DC | PRN
  Administered 2013-10-06: 6000 mL

## 2013-10-06 MED ORDER — MIDAZOLAM HCL 2 MG/2ML IJ SOLN
INTRAMUSCULAR | Status: AC
  Filled 2013-10-06: qty 2

## 2013-10-06 MED ORDER — LACTATED RINGERS IV SOLN
INTRAVENOUS | Status: DC
  Administered 2013-10-06: 11:00:00 via INTRAVENOUS

## 2013-10-06 MED ORDER — HYDROMORPHONE HCL PF 1 MG/ML IJ SOLN: 0.2500 mg | INTRAMUSCULAR | Status: DC | PRN

## 2013-10-06 MED ORDER — PANTOPRAZOLE SODIUM 40 MG PO TBEC: 80.0000 mg | DELAYED_RELEASE_TABLET | Freq: Every day | ORAL | Status: DC

## 2013-10-06 MED ORDER — METHOCARBAMOL 500 MG PO TABS
500.0000 mg | ORAL_TABLET | Freq: Four times a day (QID) | ORAL | Status: DC | PRN
  Filled 2013-10-06: qty 1

## 2013-10-06 MED ORDER — LIDOCAINE HCL (CARDIAC) 20 MG/ML IV SOLN
INTRAVENOUS | Status: DC | PRN
  Administered 2013-10-06: 80 mg via INTRAVENOUS

## 2013-10-06 MED ORDER — FLAVOCOXID 500 MG PO CAPS: 1.0000 | ORAL_CAPSULE | Freq: Every day | ORAL | Status: DC

## 2013-10-06 MED ORDER — OXYCODONE HCL 5 MG/5ML PO SOLN: 5.0000 mg | Freq: Once | ORAL | Status: DC | PRN

## 2013-10-06 MED ORDER — SUCCINYLCHOLINE CHLORIDE 20 MG/ML IJ SOLN
INTRAMUSCULAR | Status: DC | PRN
  Administered 2013-10-06: 100 mg via INTRAVENOUS

## 2013-10-06 MED ORDER — BUPIVACAINE-EPINEPHRINE (PF) 0.5% -1:200000 IJ SOLN
INTRAMUSCULAR | Status: DC | PRN
  Administered 2013-10-06: 30 mL via PERINEURAL

## 2013-10-06 MED ORDER — SCOPOLAMINE 1 MG/3DAYS TD PT72
MEDICATED_PATCH | TRANSDERMAL | Status: AC
  Filled 2013-10-06: qty 1

## 2013-10-06 MED ORDER — DIPHENHYDRAMINE HCL 12.5 MG/5ML PO ELIX: 12.5000 mg | ORAL_SOLUTION | ORAL | Status: DC | PRN

## 2013-10-06 MED ORDER — DEXTROSE 5 % IV SOLN
3.0000 g | INTRAVENOUS | Status: AC
  Administered 2013-10-06: 3 g via INTRAVENOUS

## 2013-10-06 SURGICAL SUPPLY — 75 items
ANCH SUT SWLK 19.1X5.5 CLS EL (Anchor) ×2 IMPLANT
ANCHOR PEEK SWIVEL LOCK 5.5 (Anchor) ×4 IMPLANT
APL SKNCLS STERI-STRIP NONHPOA (GAUZE/BANDAGES/DRESSINGS)
BENZOIN TINCTURE PRP APPL 2/3 (GAUZE/BANDAGES/DRESSINGS) IMPLANT
BLADE CUTTER GATOR 3.5 (BLADE) ×3 IMPLANT
BLADE CUTTER MENIS 5.5 (BLADE) IMPLANT
BLADE GREAT WHITE 4.2 (BLADE) ×2 IMPLANT
BLADE GREAT WHITE 4.2MM (BLADE) ×1
BLADE SURG 15 STRL LF DISP TIS (BLADE) IMPLANT
BLADE SURG 15 STRL SS (BLADE)
BUR OVAL 6.0 (BURR) ×3 IMPLANT
CANISTER SUCT 3000ML (MISCELLANEOUS) IMPLANT
CANNULA DRY DOC 8X75 (CANNULA) ×2 IMPLANT
CANNULA TWIST IN 8.25X7CM (CANNULA) IMPLANT
CLOSURE WOUND 1/2 X4 (GAUZE/BANDAGES/DRESSINGS)
DECANTER SPIKE VIAL GLASS SM (MISCELLANEOUS) IMPLANT
DRAPE OEC MINIVIEW 54X84 (DRAPES) ×2 IMPLANT
DRAPE STERI 35X30 U-POUCH (DRAPES) ×3 IMPLANT
DRAPE U-SHAPE 47X51 STRL (DRAPES) ×3 IMPLANT
DRAPE U-SHAPE 76X120 STRL (DRAPES) ×6 IMPLANT
DRSG PAD ABDOMINAL 8X10 ST (GAUZE/BANDAGES/DRESSINGS) ×3 IMPLANT
DURAPREP 26ML APPLICATOR (WOUND CARE) ×3 IMPLANT
ELECT MENISCUS 165MM 90D (ELECTRODE) ×3 IMPLANT
ELECT REM PT RETURN 9FT ADLT (ELECTROSURGICAL) ×3
ELECTRODE REM PT RTRN 9FT ADLT (ELECTROSURGICAL) ×1 IMPLANT
GAUZE SPONGE 4X4 12PLY STRL (GAUZE/BANDAGES/DRESSINGS) ×6 IMPLANT
GAUZE XEROFORM 1X8 LF (GAUZE/BANDAGES/DRESSINGS) ×3 IMPLANT
GLOVE BIO SURGEON STRL SZ 6.5 (GLOVE) ×1 IMPLANT
GLOVE BIO SURGEONS STRL SZ 6.5 (GLOVE) ×1
GLOVE BIOGEL PI IND STRL 7.0 (GLOVE) ×1 IMPLANT
GLOVE BIOGEL PI INDICATOR 7.0 (GLOVE) ×4
GLOVE ECLIPSE 6.5 STRL STRAW (GLOVE) ×3 IMPLANT
GLOVE ORTHO TXT STRL SZ7.5 (GLOVE) ×3 IMPLANT
GOWN STRL REUS W/ TWL LRG LVL3 (GOWN DISPOSABLE) ×2 IMPLANT
GOWN STRL REUS W/ TWL XL LVL3 (GOWN DISPOSABLE) ×1 IMPLANT
GOWN STRL REUS W/TWL LRG LVL3 (GOWN DISPOSABLE) ×6
GOWN STRL REUS W/TWL XL LVL3 (GOWN DISPOSABLE) ×3
IMMOBILIZER SHOULDER FOAM XLGE (SOFTGOODS) ×2 IMPLANT
IV NS IRRIG 3000ML ARTHROMATIC (IV SOLUTION) ×12 IMPLANT
MANIFOLD NEPTUNE II (INSTRUMENTS) ×3 IMPLANT
NDL SCORPION MULTI FIRE (NEEDLE) IMPLANT
NDL SUT 6 .5 CRC .975X.05 MAYO (NEEDLE) IMPLANT
NEEDLE MAYO TAPER (NEEDLE)
NEEDLE SCORPION MULTI FIRE (NEEDLE) ×3 IMPLANT
PACK ARTHROSCOPY DSU (CUSTOM PROCEDURE TRAY) ×3 IMPLANT
PACK BASIN DAY SURGERY FS (CUSTOM PROCEDURE TRAY) ×3 IMPLANT
PASSER SUT SWANSON 36MM LOOP (INSTRUMENTS) IMPLANT
PENCIL BUTTON HOLSTER BLD 10FT (ELECTRODE) ×3 IMPLANT
SET ARTHROSCOPY TUBING (MISCELLANEOUS) ×3
SET ARTHROSCOPY TUBING LN (MISCELLANEOUS) ×1 IMPLANT
SLEEVE SCD COMPRESS KNEE MED (MISCELLANEOUS) ×2 IMPLANT
SLING ARM IMMOBILIZER LRG (SOFTGOODS) IMPLANT
SLING ARM IMMOBILIZER MED (SOFTGOODS) IMPLANT
SLING ARM LRG ADULT FOAM STRAP (SOFTGOODS) IMPLANT
SLING ARM MED ADULT FOAM STRAP (SOFTGOODS) IMPLANT
SLING ARM XL FOAM STRAP (SOFTGOODS) IMPLANT
SPONGE LAP 4X18 X RAY DECT (DISPOSABLE) ×2 IMPLANT
STRIP CLOSURE SKIN 1/2X4 (GAUZE/BANDAGES/DRESSINGS) IMPLANT
SUCTION FRAZIER TIP 10 FR DISP (SUCTIONS) IMPLANT
SUT ETHIBOND 2 OS 4 DA (SUTURE) IMPLANT
SUT ETHILON 2 0 FS 18 (SUTURE) IMPLANT
SUT ETHILON 3 0 PS 1 (SUTURE) IMPLANT
SUT FIBERWIRE #2 38 T-5 BLUE (SUTURE)
SUT RETRIEVER MED (INSTRUMENTS) IMPLANT
SUT TIGER TAPE 7 IN WHITE (SUTURE) ×2 IMPLANT
SUT VIC AB 0 CT1 27 (SUTURE)
SUT VIC AB 0 CT1 27XBRD ANBCTR (SUTURE) IMPLANT
SUT VIC AB 2-0 SH 27 (SUTURE)
SUT VIC AB 2-0 SH 27XBRD (SUTURE) IMPLANT
SUT VIC AB 3-0 FS2 27 (SUTURE) IMPLANT
SUTURE FIBERWR #2 38 T-5 BLUE (SUTURE) IMPLANT
TAPE FIBER 2MM 7IN #2 BLUE (SUTURE) ×2 IMPLANT
TOWEL OR 17X24 6PK STRL BLUE (TOWEL DISPOSABLE) ×3 IMPLANT
WATER STERILE IRR 1000ML POUR (IV SOLUTION) ×3 IMPLANT
YANKAUER SUCT BULB TIP NO VENT (SUCTIONS) IMPLANT

## 2013-10-06 NOTE — Transfer of Care (Signed)
Immediate Anesthesia Transfer of Care Note  Patient: Brian LabsJames B [name]  Procedure(s) Performed: Procedure(s): LEFT SHOULDER ARTHROSCOPY DEBRIDEMENT EXTENTSIVE,DISTAL CLAVICULECTOMY,DECOMPRESSION SUBACROMIAL PARTIAL ACROMIOPLASTY WITH ROTATOR CUFF REPAIR, and excision of CALCIUM DEPOSIT (Left)  Patient Location: PACU  Anesthesia Type:General  Level of Consciousness: awake and alert   Airway & Oxygen Therapy: Patient Spontanous Breathing and Patient connected to face mask oxygen  Post-op Assessment: Report given to PACU RN and Post -op Vital signs reviewed and stable  Post vital signs: Reviewed and stable  Complications: No apparent anesthesia complications

## 2013-10-06 NOTE — Progress Notes (Signed)
Assisted Dr. Fitzgerald with left, ultrasound guided, interscalene  block. Side rails up, monitors on throughout procedure. See vital signs in flow sheet. Tolerated Procedure well. 

## 2013-10-06 NOTE — Anesthesia Postprocedure Evaluation (Signed)
  Anesthesia Post-op Note  Patient: [name]  Procedure(s) Performed: Procedure(s): LEFT SHOULDER ARTHROSCOPY DEBRIDEMENT EXTENTSIVE,DISTAL CLAVICULECTOMY,DECOMPRESSION SUBACROMIAL PARTIAL ACROMIOPLASTY WITH ROTATOR CUFF REPAIR, and excision of CALCIUM DEPOSIT (Left)  Patient Location: PACU  Anesthesia Type:General and block  Level of Consciousness: awake and alert   Airway and Oxygen Therapy: Patient Spontanous Breathing  Post-op Pain: none  Post-op Assessment: Post-op Vital signs reviewed, Patient's Cardiovascular Status Stable and Respiratory Function Stable  Post-op Vital Signs: Reviewed  Filed Vitals:   10/06/13 1445  BP: 140/73  Pulse: 84  Temp:   Resp: 13    Complications: No apparent anesthesia complications

## 2013-10-06 NOTE — Discharge Instructions (Signed)
Shouder arthroscopy, rotator cuff repair, subacromial decompression Care After Instructions Refer to this sheet in the next few weeks. These discharge instructions provide you with general information on caring for yourself after you leave the hospital. Your caregiver may also give you specific instructions. Your treatment has been planned according to the most current medical practices available, but unavoidable complications sometimes occur. If you have any problems or questions after discharge, please call your caregiver. HOME INSTRUCTIONS You may resume a normal diet and activities as directed. Take showers instead of baths until informed otherwise.  May take shower on Sunday. Change bandages (dressings) in 3 days.  Swab wounds daily with betadine.  Wash shoulder with soap and water.  Pat dry.  Cover wounds with bandaids. Only take over-the-counter or prescription medicines for pain, discomfort, or fever as directed by your caregiver.  Wear your sling for the next 6 weeks unless otherwise instructed. Eat a well-balanced diet.  Avoid lifting or driving until you are instructed otherwise.  Make an appointment to see your caregiver for stitches (suture) or staple removal as directed.   SEEK MEDICAL CARE IF: You have swelling of your calf or leg.  You develop shortness of breath or chest pain.  You have redness, swelling, or increasing pain in the wound.  There is pus or any unusual drainage coming from the surgical site.  You notice a bad smell coming from the surgical site or dressing.  The surgical site breaks open after sutures or staples have been removed.  There is persistent bleeding from the suture or staple line.  You are getting worse or are not improving.  You have any other questions or concerns.  SEEK IMMEDIATE MEDICAL CARE IF:  You have a fever greater than 101 You develop a rash.  You have difficulty breathing.  You develop any reaction or side effects to medicines given.    Your knee motion is decreasing rather than improving.  MAKE SURE YOU:  Understand these instructions.  Will watch your condition.  Will get help right away if you are not doing well or get worse.

## 2013-10-06 NOTE — Interval H&P Note (Signed)
History and Physical Interval Note:  10/06/2013 7:26 AM  [name]  has presented today for surgery, with the diagnosis of LEFT SHOULDER DISORDER ARTICULAR CARTILAGE DEGENERATIVE ARTHRITIS, DISORDERS OF BURSAE AND TENDONS IN SHOULDER REGION UNSPECIFIED BICIPITAL  The various methods of treatment have been discussed with the patient and family. After consideration of risks, benefits and other options for treatment, the patient has consented to  Procedure(s): LEFT SHOULDER ARTHROSCOPY DEBRIDEMENT EXTENTSIVE,DISTAL CLAVICULECTOMY,DECOMPRESSION SUBACROMIAL PARTIAL ACROMIOPLASTY WITH BICEP TENODESIS (Left) as a surgical intervention .  The patient's history has been reviewed, patient examined, no change in status, stable for surgery.  I have reviewed the patient's chart and labs.  Questions were answered to the patient's satisfaction.     MURPHY,DANIEL F

## 2013-10-06 NOTE — Anesthesia Preprocedure Evaluation (Addendum)
Anesthesia Evaluation  Patient identified by MRN, date of birth, ID band Patient awake    Reviewed: Allergy & Precautions, H&P , NPO status , Patient's Chart, lab work & pertinent test results  History of Anesthesia Complications (+) PONV  Airway Mallampati: II TM Distance: >3 FB Neck ROM: Full    Dental no notable dental hx. (+) Teeth Intact, Dental Advisory Given   Pulmonary asthma , sleep apnea and Continuous Positive Airway Pressure Ventilation ,  breath sounds clear to auscultation  Pulmonary exam normal       Cardiovascular negative cardio ROS  Rhythm:Regular Rate:Normal     Neuro/Psych negative neurological ROS  negative psych ROS   GI/Hepatic Neg liver ROS, GERD-  Medicated and Controlled,  Endo/Other  Morbid obesity  Renal/GU negative Renal ROS  negative genitourinary   Musculoskeletal   Abdominal   Peds  Hematology negative hematology ROS (+)   Anesthesia Other Findings   Reproductive/Obstetrics negative OB ROS                          Anesthesia Physical Anesthesia Plan  ASA: III  Anesthesia Plan: General and Regional   Post-op Pain Management:    Induction: Intravenous  Airway Management Planned: Oral ETT  Additional Equipment:   Intra-op Plan:   Post-operative Plan: Extubation in OR  Informed Consent: I have reviewed the patients History and Physical, chart, labs and discussed the procedure including the risks, benefits and alternatives for the proposed anesthesia with the patient or authorized representative who has indicated his/her understanding and acceptance.   Dental advisory given  Plan Discussed with: CRNA  Anesthesia Plan Comments:         Anesthesia Quick Evaluation

## 2013-10-06 NOTE — Anesthesia Procedure Notes (Addendum)
Anesthesia Regional Block:  Interscalene brachial plexus block  Pre-Anesthetic Checklist: ,, timeout performed, Correct Patient, Correct Site, Correct Laterality, Correct Procedure, Correct Position, site marked, Risks and benefits discussed, pre-op evaluation,  At surgeon's request and post-op pain management  Laterality: Left  Prep: Maximum Sterile Barrier Precautions used and chloraprep       Needles:  Injection technique: Single-shot  Needle Type: Echogenic Stimulator Needle     Needle Length: 5cm 5 cm Needle Gauge: 22 and 22 G    Additional Needles:  Procedures: ultrasound guided (picture in chart) and nerve stimulator Interscalene brachial plexus block  Nerve Stimulator or Paresthesia:  Response: Biceps response,   Additional Responses:   Narrative:  Start time: 10/06/2013 11:15 AM End time: 10/06/2013 11:24 AM Injection made incrementally with aspirations every 5 mL. Anesthesiologist: Sampson GoonFitzgerald, MD  Additional Notes: 2% Lidocaine skin wheel.    Procedure Name: Intubation Performed by: York GricePEARSON, DONNA W Pre-anesthesia Checklist: Patient identified, Timeout performed, Emergency Drugs available, Suction available and Patient being monitored Patient Re-evaluated:Patient Re-evaluated prior to inductionOxygen Delivery Method: Circle system utilized Preoxygenation: Pre-oxygenation with 100% oxygen Intubation Type: IV induction Ventilation: Mask ventilation without difficulty Laryngoscope Size: Miller and 2 Grade View: Grade II Tube type: Oral Tube size: 8.0 mm Number of attempts: 1 Airway Equipment and Method: Stylet Placement Confirmation: ETT inserted through vocal cords under direct vision,  breath sounds checked- equal and bilateral and positive ETCO2 Secured at: 22 cm Tube secured with: Tape Dental Injury: Teeth and Oropharynx as per pre-operative assessment

## 2013-10-07 ENCOUNTER — Encounter (HOSPITAL_BASED_OUTPATIENT_CLINIC_OR_DEPARTMENT_OTHER): Payer: Self-pay | Admitting: Orthopedic Surgery

## 2013-10-07 DIAGNOSIS — M899 Disorder of bone, unspecified: Secondary | ICD-10-CM | POA: Diagnosis not present

## 2013-10-07 DIAGNOSIS — Z96659 Presence of unspecified artificial knee joint: Secondary | ICD-10-CM | POA: Diagnosis not present

## 2013-10-07 DIAGNOSIS — M67919 Unspecified disorder of synovium and tendon, unspecified shoulder: Secondary | ICD-10-CM | POA: Diagnosis not present

## 2013-10-07 DIAGNOSIS — M719 Bursopathy, unspecified: Secondary | ICD-10-CM | POA: Diagnosis not present

## 2013-10-07 DIAGNOSIS — M949 Disorder of cartilage, unspecified: Secondary | ICD-10-CM | POA: Diagnosis not present

## 2013-10-07 DIAGNOSIS — M1710 Unilateral primary osteoarthritis, unspecified knee: Secondary | ICD-10-CM | POA: Diagnosis not present

## 2013-10-07 DIAGNOSIS — M25819 Other specified joint disorders, unspecified shoulder: Secondary | ICD-10-CM | POA: Diagnosis not present

## 2013-10-07 MED ORDER — CEFAZOLIN SODIUM-DEXTROSE 2-3 GM-% IV SOLR
INTRAVENOUS | Status: AC
  Filled 2013-10-07: qty 50

## 2013-10-07 NOTE — Op Note (Signed)
NAME:  Brian BaliHUMBERT, Brian               ACCOUNT NO.:  [id number]  MEDICAL RECORD NO.:  [id number]  LOCATION:                                 FACILITY:  PHYSICIAN:  Loreta Aveaniel F. Murphy, M.D. DATE OF BIRTH:  [birth year]  DATE OF PROCEDURE:  10/06/2013 DATE OF DISCHARGE:  10/07/2013                              OPERATIVE REPORT   PREOPERATIVE DIAGNOSES:  Left shoulder symptomatic impingement with calcific rotator cuff tendinitis, supraspinatus tendon, probable functional full-thickness tear, distal clavicle osteolysis, morbid obesity.  POSTOPERATIVE DIAGNOSES:  Left shoulder symptomatic impingement with calcific rotator cuff tendinitis, supraspinatus tendon, probable functional full-thickness tear, distal clavicle osteolysis, morbid obesity.  PROCEDURES:  Left shoulder exam under anesthesia, arthroscopy. Debridement and excision of all calcific deposits from supraspinatus tendon.  Bursectomy, acromioplasty, CA ligament release.  Excision of distal clavicle.  Arthroscopic-assisted rotator cuff repair with FiberWire suture x2, SwiveLock anchors x2.  SURGEON:  Loreta Aveaniel F. Murphy, M.D.  ASSISTANT:  Odelia GageLindsey Anton, PA, present throughout the entire case and necessary for timely completion of procedure.  ANESTHESIA:  General.  ESTIMATED BLOOD LOSS:  Minimal.  SPECIMENS:  None.  CULTURES:  None.  COMPLICATIONS:  None.  DRESSINGS:  Soft compressive shoulder immobilizer.  DESCRIPTION OF PROCEDURE:  The patient was brought to operating room, placed on the operating table in supine position.  After adequate anesthesia had been obtained, placed in a beach-chair position.  His weight and morbid obesity making every aspect of the procedure more difficult.  Full motion and stable shoulder.  Prepped and draped in usual sterile fashion.  Three portals, anterior, posterior, and lateral. Arthroscope introduced, shoulder distended and inspected.  Articular cartilage, biceps tendon, biceps anchor,  labrum, bottom of the cuff all looked good.  From above, however, typical impingement, marked reactive bursitis, debrided.  Type 2 acromion.  Calcific deposits in the supraspinatus identified, completely evacuated out, leaving me with a functional full-thickness tear throughout the crescent region.  This was mobilized, tuberosity roughened.  Acromioplasty to a type 1 acromion, releasing the CA ligament.  Distal clavicle exposed, lateral centimeter, and spurs excised.  With a cannula laterally, the cuff was sewn with two horizontal mattress sutures, firmly repaired down the tuberosity with two SwiveLock anchors.  Adequacy of decompression, cuff repair confirmed viewing from all portals.  Instruments and fluids were removed.  Portals were closed with nylon.  Sterile compressive dressing applied.  Shoulder immobilizer applied.  Anesthesia reversed.  Brought to the recovery room.  Tolerated the surgery well.  No complications.     Loreta Aveaniel F. Murphy, M.D.     DFM/MEDQ  D:  10/06/2013  T:  10/07/2013  Job:  [id number]

## 2013-10-18 DIAGNOSIS — Z4789 Encounter for other orthopedic aftercare: Secondary | ICD-10-CM | POA: Diagnosis not present

## 2013-10-20 DIAGNOSIS — M75120 Complete rotator cuff tear or rupture of unspecified shoulder, not specified as traumatic: Secondary | ICD-10-CM | POA: Diagnosis not present

## 2013-10-24 DIAGNOSIS — M75120 Complete rotator cuff tear or rupture of unspecified shoulder, not specified as traumatic: Secondary | ICD-10-CM | POA: Diagnosis not present

## 2013-10-26 DIAGNOSIS — M75120 Complete rotator cuff tear or rupture of unspecified shoulder, not specified as traumatic: Secondary | ICD-10-CM | POA: Diagnosis not present

## 2013-11-02 ENCOUNTER — Encounter: Payer: Self-pay | Admitting: *Deleted

## 2013-11-14 DIAGNOSIS — M75120 Complete rotator cuff tear or rupture of unspecified shoulder, not specified as traumatic: Secondary | ICD-10-CM | POA: Diagnosis not present

## 2013-11-16 DIAGNOSIS — M75120 Complete rotator cuff tear or rupture of unspecified shoulder, not specified as traumatic: Secondary | ICD-10-CM | POA: Diagnosis not present

## 2013-11-23 DIAGNOSIS — M75120 Complete rotator cuff tear or rupture of unspecified shoulder, not specified as traumatic: Secondary | ICD-10-CM | POA: Diagnosis not present

## 2013-11-28 DIAGNOSIS — M75120 Complete rotator cuff tear or rupture of unspecified shoulder, not specified as traumatic: Secondary | ICD-10-CM | POA: Diagnosis not present

## 2013-11-30 DIAGNOSIS — M75120 Complete rotator cuff tear or rupture of unspecified shoulder, not specified as traumatic: Secondary | ICD-10-CM | POA: Diagnosis not present

## 2013-12-05 DIAGNOSIS — M75120 Complete rotator cuff tear or rupture of unspecified shoulder, not specified as traumatic: Secondary | ICD-10-CM | POA: Diagnosis not present

## 2013-12-07 DIAGNOSIS — M75120 Complete rotator cuff tear or rupture of unspecified shoulder, not specified as traumatic: Secondary | ICD-10-CM | POA: Diagnosis not present

## 2013-12-12 DIAGNOSIS — M75120 Complete rotator cuff tear or rupture of unspecified shoulder, not specified as traumatic: Secondary | ICD-10-CM | POA: Diagnosis not present

## 2013-12-14 DIAGNOSIS — M75120 Complete rotator cuff tear or rupture of unspecified shoulder, not specified as traumatic: Secondary | ICD-10-CM | POA: Diagnosis not present

## 2014-01-02 DIAGNOSIS — M75120 Complete rotator cuff tear or rupture of unspecified shoulder, not specified as traumatic: Secondary | ICD-10-CM | POA: Diagnosis not present

## 2014-04-28 ENCOUNTER — Other Ambulatory Visit (INDEPENDENT_AMBULATORY_CARE_PROVIDER_SITE_OTHER): Payer: Self-pay

## 2014-04-28 DIAGNOSIS — I509 Heart failure, unspecified: Secondary | ICD-10-CM

## 2014-05-01 ENCOUNTER — Other Ambulatory Visit (INDEPENDENT_AMBULATORY_CARE_PROVIDER_SITE_OTHER): Payer: Self-pay | Admitting: Surgery

## 2014-05-01 DIAGNOSIS — I509 Heart failure, unspecified: Secondary | ICD-10-CM

## 2014-05-15 ENCOUNTER — Other Ambulatory Visit: Payer: Self-pay

## 2014-05-15 ENCOUNTER — Ambulatory Visit (HOSPITAL_COMMUNITY)
Admission: RE | Admit: 2014-05-15 | Discharge: 2014-05-15 | Disposition: A | Discharge status: Home / Self Care | Payer: BLUE CROSS/BLUE SHIELD | Source: Ambulatory Visit | Attending: Surgery | Admitting: Surgery

## 2014-05-15 DIAGNOSIS — Z6841 Body Mass Index (BMI) 40.0 and over, adult: Secondary | ICD-10-CM | POA: Diagnosis not present

## 2014-05-15 DIAGNOSIS — I517 Cardiomegaly: Secondary | ICD-10-CM | POA: Diagnosis not present

## 2014-05-15 DIAGNOSIS — E6609 Other obesity due to excess calories: Secondary | ICD-10-CM | POA: Diagnosis not present

## 2014-05-15 DIAGNOSIS — I509 Heart failure, unspecified: Secondary | ICD-10-CM | POA: Insufficient documentation

## 2014-05-15 DIAGNOSIS — Z01818 Encounter for other preprocedural examination: Secondary | ICD-10-CM | POA: Insufficient documentation

## 2014-05-15 DIAGNOSIS — M4326 Fusion of spine, lumbar region: Secondary | ICD-10-CM | POA: Insufficient documentation

## 2014-05-30 ENCOUNTER — Ambulatory Visit: Payer: Medicare Other | Admitting: Dietician

## 2014-06-05 ENCOUNTER — Encounter: Payer: BLUE CROSS/BLUE SHIELD | Attending: Surgery | Admitting: Dietician

## 2014-06-05 ENCOUNTER — Encounter: Payer: Self-pay | Admitting: Dietician

## 2014-06-05 DIAGNOSIS — Z6841 Body Mass Index (BMI) 40.0 and over, adult: Secondary | ICD-10-CM | POA: Diagnosis not present

## 2014-06-05 DIAGNOSIS — Z713 Dietary counseling and surveillance: Secondary | ICD-10-CM | POA: Insufficient documentation

## 2014-06-05 NOTE — Progress Notes (Signed)
  Pre-Op Assessment Visit:  Pre-Operative Gastric Sleeve Surgery  Medical Nutrition Therapy:  Appt start time: 0930   End time:  1015.  Patient was seen on 06/05/2014 for Pre-Operative Nutrition Assessment. [facility] on 06/05/2014.   Preferred Learning Style:   No preference indicated   Learning Readiness:   Ready  Handouts given during visit include:  Pre-Op Goals Bariatric Surgery Protein Shakes   During the appointment today the following Pre-Op Goals were reviewed with the patient: Maintain or lose weight as instructed by your surgeon Make healthy food choices Begin to limit portion sizes Limited concentrated sugars and fried foods Keep fat/sugar in the single digits per serving on   food labels Practice CHEWING your food  (aim for 30 chews per bite or until applesauce consistency) Practice not drinking 15 minutes before, during, and 30 minutes after each meal/snack Avoid all carbonated beverages  Avoid/limit caffeinated beverages  Avoid all sugar-sweetened beverages Consume 3 meals per day; eat every 3-5 hours Make a list of non-food related activities Aim for 64-100 ounces of FLUID daily  Aim for at least 60-80 grams of PROTEIN daily Look for a liquid protein source that contain ?15 g protein and ?5 g carbohydrate  (ex: shakes, drinks, shots)  Patient-Centered Goals: -Better mobility -More energy  Scale of 1-10: confidence(9) / importance scale (10)  Demonstrated degree of understanding via:  Teach Back  Teaching Method Utilized:  Visual Auditory Hands on  Barriers to learning/adherence to lifestyle change: none  Patient to call the [facility] to enroll in Pre-Op and Post-Op Nutrition Education when surgery date is scheduled.

## 2014-06-05 NOTE — Patient Instructions (Signed)
Follow Pre-Op Goals Try Protein Shakes Call [facility] at [phone] when surgery is scheduled to enroll in Pre-Op Class  Things to remember:  Please always be honest with us. We want to support you!  If you have any questions or concerns in between appointments, please call or email Liz, Nikkole Placzek, or Laurie.  The diet after surgery will be high protein and low in carbohydrate.  Vitamins and calcium need to be taken for the rest of your life.  Feel free to include support people in any classes or appointments.  

## 2014-06-19 ENCOUNTER — Encounter: Payer: BLUE CROSS/BLUE SHIELD | Attending: Surgery | Admitting: Dietician

## 2014-06-19 DIAGNOSIS — Z713 Dietary counseling and surveillance: Secondary | ICD-10-CM | POA: Insufficient documentation

## 2014-06-19 DIAGNOSIS — Z6841 Body Mass Index (BMI) 40.0 and over, adult: Secondary | ICD-10-CM | POA: Diagnosis not present

## 2014-06-19 NOTE — Progress Notes (Signed)
  Supervised Weight Loss:  Appt start time: 1125 end time:  1140  SWL visit 1:  Primary concerns today: Has lost a few pounds since assessment 1 month ago. He has a tendency to retain fluid. He states he has not started practicing any pre op goals.  Seems resistant to trying protein shakes.   Goals: Have something for breakfast (egg)  Weight: 422.9 BMI: 60.8  MEDICATIONS: see list  DIETARY INTAKE:  24-hr recall:  B (AM): water and black coffee or diet soda  Snk ( AM) :none  L (11-3 PM): bologna and cheese sandwich or nachos or leftovers  Snk ( PM): none D (PM): chicken and rice or spaghetti or tacos or hamburgers or fish with vegetables  Snk (12-3 AM): bologna sandwich or nachos  Beverages: black coffee, water, diet soda, occasionally orange juice  Recent physical activity: none  Estimated energy needs: 2000-2200 calories  Progress Towards Goal(s):  In progress.   Nutritional Diagnosis:  [place]-3.3 Overweight/obesity related to past poor dietary habits and physical inactivity as evidenced by patient in SWL for pending bariatric surgery following dietary guidelines for continued weight loss.    Intervention:  Nutrition counseling provided.  Patient-Centered Goals: -Better mobility -More energy  Scale of 1-10: confidence(9) / importance scale (10)   Monitoring/Evaluation:  Dietary intake, exercise, and body weight in 4 month(s).

## 2014-07-05 ENCOUNTER — Ambulatory Visit (INDEPENDENT_AMBULATORY_CARE_PROVIDER_SITE_OTHER): Payer: BLUE CROSS/BLUE SHIELD | Admitting: Psychiatry

## 2014-07-05 ENCOUNTER — Ambulatory Visit: Payer: Medicare Other | Admitting: Psychiatry

## 2014-07-17 ENCOUNTER — Encounter: Payer: BLUE CROSS/BLUE SHIELD | Attending: Surgery | Admitting: Dietician

## 2014-07-17 DIAGNOSIS — Z6841 Body Mass Index (BMI) 40.0 and over, adult: Secondary | ICD-10-CM | POA: Insufficient documentation

## 2014-07-17 DIAGNOSIS — Z713 Dietary counseling and surveillance: Secondary | ICD-10-CM | POA: Diagnosis not present

## 2014-07-17 NOTE — Patient Instructions (Addendum)
Goals: Have something for breakfast every day and start working on reducing Diet Coke and caffeine

## 2014-07-17 NOTE — Progress Notes (Signed)
  Supervised Weight Loss:  Appt start time: 1055 end time:  1110  SWL visit 2:  Primary concerns today: [name] returns having gained 3.5 pounds since last visit. He reports he has been working on having something for breakfast every day. May have meat and cheese wrapped in lettuce. Has had to take more pain medicine lately. Recently went to psychiatrist appointment and plans to follow up with her.   Goals: Have something for breakfast every day and start working on reducing Diet Coke and overall caffeine intake  Weight: 426.4 BMI: 61.3  MEDICATIONS: see list  DIETARY INTAKE:  24-hr recall:  B (AM): water and black coffee or diet soda  Snk ( AM) :none  L (11-3 PM): bologna and cheese sandwich or nachos or leftovers  Snk ( PM): none D (PM): chicken and rice or spaghetti or tacos or hamburgers or fish with vegetables  Snk (12-3 AM): bologna sandwich or nachos  Beverages: black coffee, water, diet soda, occasionally orange juice  Recent physical activity: none  Estimated energy needs: 2000-2200 calories  Progress Towards Goal(s):  In progress.   Nutritional Diagnosis:  [place]-3.3 Overweight/obesity related to past poor dietary habits and physical inactivity as evidenced by patient in SWL for pending bariatric surgery following dietary guidelines for continued weight loss.    Intervention:  Nutrition counseling provided.  Patient-Centered Goals: -Better mobility -More energy  Scale of 1-10: confidence(9) / importance scale (10)   Monitoring/Evaluation:  Dietary intake, exercise, and body weight in 4 week(s).

## 2014-07-18 ENCOUNTER — Ambulatory Visit (INDEPENDENT_AMBULATORY_CARE_PROVIDER_SITE_OTHER): Payer: BLUE CROSS/BLUE SHIELD | Admitting: Psychiatry

## 2014-08-14 ENCOUNTER — Encounter: Payer: BLUE CROSS/BLUE SHIELD | Attending: Surgery | Admitting: Dietician

## 2014-08-14 DIAGNOSIS — Z713 Dietary counseling and surveillance: Secondary | ICD-10-CM | POA: Insufficient documentation

## 2014-08-14 DIAGNOSIS — Z6841 Body Mass Index (BMI) 40.0 and over, adult: Secondary | ICD-10-CM | POA: Insufficient documentation

## 2014-08-14 NOTE — Progress Notes (Signed)
  Supervised Weight Loss:  Appt start time: 1155 end time:  1210  SWL visit 4:  Primary concerns today: Fayrene FearingJames returns having gained 13.5 pounds since last visit. Thinks he may need to restart Lasix. He has realized that he needs to pre-prepare healthy breakfast foods like boiled eggs. He has also been working on reducing Diet Coke intake. He has had only 2 in the last 3 weeks.   Goals:  -Have something for breakfast every day and start working on reducing Diet Coke and overall caffeine intake -Try Powerade Zero instead of Gatorade -Make sure that your protein powder meets criteria  Weight: 440.6 lbs BMI: 63.4  MEDICATIONS: see list  DIETARY INTAKE:  24-hr recall:  B (AM): water and black coffee or diet soda  Snk ( AM) :none  L (11-3 PM): bologna and cheese sandwich or nachos or leftovers  Snk ( PM): none D (PM): chicken and rice or spaghetti or tacos or hamburgers or fish with vegetables  Snk (12-3 AM): bologna sandwich or nachos  Beverages: black coffee, water, diet soda, occasionally orange juice  Recent physical activity: none  Estimated energy needs: 2000-2200 calories  Progress Towards Goal(s):  In progress.   Nutritional Diagnosis:  [place]-3.3 Overweight/obesity related to past poor dietary habits and physical inactivity as evidenced by patient in SWL for pending bariatric surgery following dietary guidelines for continued weight loss.    Intervention:  Nutrition counseling provided.  Patient-Centered Goals: -Better mobility -More energy  Scale of 1-10: confidence(9) / importance scale (10)   Monitoring/Evaluation:  Dietary intake, exercise, and body weight in 4 week(s).

## 2014-09-18 ENCOUNTER — Encounter: Payer: BLUE CROSS/BLUE SHIELD | Attending: Surgery | Admitting: Dietician

## 2014-09-18 ENCOUNTER — Encounter: Payer: Self-pay | Admitting: Dietician

## 2014-09-18 DIAGNOSIS — Z6841 Body Mass Index (BMI) 40.0 and over, adult: Secondary | ICD-10-CM | POA: Diagnosis not present

## 2014-09-18 DIAGNOSIS — Z713 Dietary counseling and surveillance: Secondary | ICD-10-CM | POA: Diagnosis not present

## 2014-09-18 NOTE — Progress Notes (Signed)
  Supervised Weight Loss:  Appt start time: 1120 end time:  1135  SWL visit 5:  Primary concerns today: [name] returns having lost 18 pounds since last visit. He reports he has been taking his fluid pill consistently. Has not had a Diet Coke in the last month and has been drinking water with sugarfree flavoring. Tried Powerade Zero and prefers flavored water. He confirms that the protein powder he bought meets our criteria.   Goals:  -Work on having breakfast every morning AND lunch (even if it's just a protein shake)  Weight: 422 lbs BMI: 60.7  MEDICATIONS: see list  DIETARY INTAKE:  24-hr recall:  B (AM): water and black coffee or diet soda  Snk ( AM) :none  L (11-3 PM): bologna and cheese sandwich or nachos or leftovers  Snk ( PM): none D (PM): chicken and rice or spaghetti or tacos or hamburgers or fish with vegetables  Snk (12-3 AM): bologna sandwich or nachos  Beverages: black coffee, water, diet soda, occasionally orange juice  Recent physical activity: none  Estimated energy needs: 2000-2200 calories  Progress Towards Goal(s):  In progress.   Nutritional Diagnosis:  [place]-3.3 Overweight/obesity related to past poor dietary habits and physical inactivity as evidenced by patient in SWL for pending bariatric surgery following dietary guidelines for continued weight loss.    Intervention:  Nutrition counseling provided.  Patient-Centered Goals: -Better mobility -More energy  Scale of 1-10: confidence(9) / importance scale (10)   Monitoring/Evaluation:  Dietary intake, exercise, and body weight in 4 week(s).

## 2014-10-13 ENCOUNTER — Institutional Professional Consult (permissible substitution): Payer: Self-pay | Admitting: Internal Medicine

## 2014-10-13 ENCOUNTER — Ambulatory Visit: Payer: BLUE CROSS/BLUE SHIELD | Admitting: Dietician

## 2014-10-17 ENCOUNTER — Encounter: Payer: Self-pay | Admitting: Dietician

## 2014-10-17 ENCOUNTER — Encounter: Payer: BLUE CROSS/BLUE SHIELD | Attending: Surgery | Admitting: Dietician

## 2014-10-17 DIAGNOSIS — Z713 Dietary counseling and surveillance: Secondary | ICD-10-CM | POA: Insufficient documentation

## 2014-10-17 DIAGNOSIS — Z6841 Body Mass Index (BMI) 40.0 and over, adult: Secondary | ICD-10-CM | POA: Diagnosis not present

## 2014-10-17 NOTE — Patient Instructions (Addendum)
Goals:  -Work on having breakfast every morning AND lunch -Work on switching over to decaf coffee -Work on chewing 20-30 chews per bite -Avoid drinking 15 minutes before, during, and up through 30 minutes after a meal

## 2014-10-17 NOTE — Progress Notes (Signed)
  Supervised Weight Loss:  Appt start time: 910 end time:  925  SWL visit 5:  Primary concerns today: Fayrene FearingJames returns having lost 3 pounds since last visit. Has started taking multivitamins. Had one soda in the past month and upset his stomach. Mostly drinking water or flavor packet. Mostly has been having breakfast and lunch each day (protein bar or bran flakes). Having no sugary or fried foods. Has been working on cutting out overnight eating.   Goals:  -Work on having breakfast every morning AND lunch -Work on switching over to decaf coffee -Work on chewing 20-30 chews per bite -Avoid drinking 15 minutes before, during, and up through 30 minutes after a meal   Weight: 417.6 lbs BMI: 59.9  MEDICATIONS: see list  DIETARY INTAKE:  24-hr recall:  B (AM): water and black coffee or diet soda  Snk ( AM) :none  L (11-3 PM): bologna and cheese sandwich or nachos or leftovers  Snk ( PM): none D (PM): chicken and rice or spaghetti or tacos or hamburgers or fish with vegetables  Snk (12-3 AM): bologna sandwich or nachos  Beverages: black coffee, water, diet soda, occasionally orange juice  Recent physical activity: none  Estimated energy needs: 2000-2200 calories  Progress Towards Goal(s):  In progress.   Nutritional Diagnosis:  [place]-3.3 Overweight/obesity related to past poor dietary habits and physical inactivity as evidenced by patient in SWL for pending bariatric surgery following dietary guidelines for continued weight loss.    Intervention:  Nutrition counseling provided.  Patient-Centered Goals: -Better mobility -More energy  Scale of 1-10: confidence(9) / importance scale (10)   Monitoring/Evaluation:  Dietary intake, exercise, and body weight for Pre-Op Class

## 2014-10-20 ENCOUNTER — Ambulatory Visit (INDEPENDENT_AMBULATORY_CARE_PROVIDER_SITE_OTHER)
Admission: RE | Admit: 2014-10-20 | Discharge: 2014-10-20 | Disposition: A | Discharge status: Home / Self Care | Payer: BLUE CROSS/BLUE SHIELD | Source: Ambulatory Visit | Attending: Internal Medicine | Admitting: Internal Medicine

## 2014-10-20 ENCOUNTER — Encounter: Payer: Self-pay | Admitting: Internal Medicine

## 2014-10-20 ENCOUNTER — Ambulatory Visit (INDEPENDENT_AMBULATORY_CARE_PROVIDER_SITE_OTHER): Payer: BLUE CROSS/BLUE SHIELD | Admitting: Internal Medicine

## 2014-10-20 ENCOUNTER — Other Ambulatory Visit (INDEPENDENT_AMBULATORY_CARE_PROVIDER_SITE_OTHER): Payer: BLUE CROSS/BLUE SHIELD

## 2014-10-20 VITALS — BP 156/86 | HR 97 | Ht 71.0 in | Wt >= 6400 oz

## 2014-10-20 DIAGNOSIS — G4733 Obstructive sleep apnea (adult) (pediatric): Secondary | ICD-10-CM | POA: Diagnosis not present

## 2014-10-20 DIAGNOSIS — D751 Secondary polycythemia: Secondary | ICD-10-CM | POA: Diagnosis not present

## 2014-10-20 DIAGNOSIS — R0600 Dyspnea, unspecified: Secondary | ICD-10-CM

## 2014-10-20 DIAGNOSIS — J45909 Unspecified asthma, uncomplicated: Secondary | ICD-10-CM | POA: Diagnosis not present

## 2014-10-20 LAB — CBC WITH DIFFERENTIAL/PLATELET
Basophils Absolute: 0 10*3/uL (ref 0.0–0.1)
Basophils Relative: 0.4 % (ref 0.0–3.0)
EOS PCT: 1.5 % (ref 0.0–5.0)
Eosinophils Absolute: 0.1 10*3/uL (ref 0.0–0.7)
HCT: 57.5 % — ABNORMAL HIGH (ref 39.0–52.0)
Hemoglobin: 17.8 g/dL — ABNORMAL HIGH (ref 13.0–17.0)
LYMPHS ABS: 1.5 10*3/uL (ref 0.7–4.0)
Lymphocytes Relative: 21.5 % (ref 12.0–46.0)
MCHC: 31 g/dL (ref 30.0–36.0)
MCV: 80.1 fl (ref 78.0–100.0)
Monocytes Absolute: 0.3 10*3/uL (ref 0.1–1.0)
Monocytes Relative: 4.7 % (ref 3.0–12.0)
NEUTROS ABS: 5 10*3/uL (ref 1.4–7.7)
NEUTROS PCT: 71.9 % (ref 43.0–77.0)
PLATELETS: 219 10*3/uL (ref 150.0–400.0)
RBC: 7.18 Mil/uL — ABNORMAL HIGH (ref 4.22–5.81)
RDW: 20.8 % — ABNORMAL HIGH (ref 11.5–15.5)
WBC: 7 10*3/uL (ref 4.0–10.5)

## 2014-10-20 LAB — BASIC METABOLIC PANEL
BUN: 16 mg/dL (ref 6–23)
CO2: 30 mEq/L (ref 19–32)
CREATININE: 0.89 mg/dL (ref 0.40–1.50)
Calcium: 9.8 mg/dL (ref 8.4–10.5)
Chloride: 97 mEq/L (ref 96–112)
GFR: 96.99 mL/min (ref >60.00)
Glucose, Bld: 109 mg/dL — ABNORMAL HIGH (ref 70–99)
Potassium: 4.7 mEq/L (ref 3.5–5.1)
Sodium: 136 mEq/L (ref 135–145)

## 2014-10-20 LAB — TSH: TSH: 1.31 u[IU]/mL (ref 0.35–4.50)

## 2014-10-20 LAB — BRAIN NATRIURETIC PEPTIDE: PRO B NATRI PEPTIDE: 23 pg/mL (ref 0.0–100.0)

## 2014-10-20 LAB — D-DIMER, QUANTITATIVE (NOT AT ARMC): D DIMER QUANT: 0.33 ug{FEU}/mL (ref 0.00–0.48)

## 2014-10-20 NOTE — Progress Notes (Signed)
Subjective:     Patient ID: [name], male   DOB: 21-Mar-1966     MRN: [id number]  HPI  75 yowm never smoker with baseline wt = 250 fell 1994 at work > back surgery x 3 with further wt gain then around 2010 began noting low 02 sats and needed to get pulmonary clearance for removing morphine pump due to battery failure at [facility]   10/20/2014 1st [facility] office visit/ Reanna Scoggin   Chief Complaint  Patient presents with  . Pulmonary Consult    Referred by Dr. Johny Blamer for eval of hypoxia.   dx with osa x 5-6 years does not use his cpap per [facility] - Dr Othella Boyer up with slt HA better as day goes on  Can still walk flat surfaces/ cool environment like a [employer] at slow pace  No obvious day to day or daytime variability or assoc chronic cough or cp or chest tightness, subjective wheeze or overt sinus or hb symptoms. No unusual exp hx or h/o childhood pna/ asthma or knowledge of premature birth.  Sleeping ok without nocturnal  or early am exacerbation  of respiratory  c/o's or need for noct saba. Also denies any obvious fluctuation of symptoms with weather or environmental changes or other aggravating or alleviating factors except as outlined above   Current Medications, Allergies, Complete Past Medical History, Past Surgical History, Family History, and Social History were reviewed in [employer] record.  ROS  The following are not active complaints unless bolded sore throat, dysphagia, dental problems, itching, sneezing,  nasal congestion or excess/ purulent secretions, ear ache,   fever, chills, sweats, unintended wt loss, classically pleuritic or exertional cp, hemoptysis,  orthopnea pnd or leg swelling, presyncope, palpitations, abdominal pain, anorexia, nausea, vomiting, diarrhea  or change in bowel or bladder habits, change in stools or urine, dysuria,hematuria,  rash, arthralgias, visual complaints, headache, numbness, weakness or ataxia or  problems with walking or coordination,  change in mood/affect or memory.           Review of Systems     Objective:   Physical Exam    amb wm nad  Wt Readings from Last 3 Encounters:  10/20/14 416 lb 12.8 oz (189.059 kg)  10/17/14 417 lb 9.6 oz (189.422 kg)  09/18/14 422 lb (191.418 kg)    Vital signs reviewed   HEENT: nl dentition, turbinates, and orophanx. Nl external ear canals without cough reflex   NECK :  without JVD/Nodes/TM/ nl carotid upstrokes bilaterally   LUNGS: no acc muscle use, clear to A and P bilaterally without cough on insp or exp maneuvers   CV:  RRR  no s3 or murmur or increase in P2, trace bilateral lower ext  edema   ABD:  soft and nontender with nl excursion in the supine position. No bruits or organomegaly, bowel sounds nl  MS:  warm without deformities, calf tenderness, cyanosis or clubbing  SKIN: warm and dry without lesions except chronic venous stasis changes both lower ext     NEURO:  alert, approp, no deficits     CXR PA and Lateral:   10/20/2014 :     I personally reviewed images and agree with radiology impression as follows:     1. Increased prominence of the pulmonary vascularity and cardiac silhouette size suggests low-grade CHF. 2. Right lower lobe atelectasis or pneumonia.    Labs ordered/ reviewed:  Lab 10/20/14 1247  NA 136  K 4.7  CL 97  CO2 30  BUN 16  CREATININE 0.89  GLUCOSE 109*    Lab 10/20/14 1247  HGB 17.8*  HCT 57.5 Repeated and verified X2.*  WBC 7.0  PLT 219.0     Lab Results  Component Value Date   TSH 1.31 10/20/2014     Lab Results  Component Value Date   PROBNP 23.0 10/20/2014         Assessment:

## 2014-10-20 NOTE — Patient Instructions (Addendum)
Please remember to go to the lab and x-ray department downstairs for your tests - we will call you with the results when they are available.    We will clear you for surgery after labs available with the condition it be done in a hospital with a backup BiPAP until fully mobile   We will also need to line you up with a sleep doctor who does pulmonary medicine (we are the only group)

## 2014-10-20 NOTE — Progress Notes (Signed)
Quick Note:  LMTCB ______ 

## 2014-10-21 ENCOUNTER — Encounter: Payer: Self-pay | Admitting: Internal Medicine

## 2014-10-21 DIAGNOSIS — D751 Secondary polycythemia: Secondary | ICD-10-CM | POA: Insufficient documentation

## 2014-10-21 DIAGNOSIS — G4733 Obstructive sleep apnea (adult) (pediatric): Secondary | ICD-10-CM | POA: Insufficient documentation

## 2014-10-21 NOTE — Assessment & Plan Note (Addendum)
Body mass index is 58.16    Lab Results  Component Value Date   TSH 1.31 10/20/2014     Contributing to gerd tendency/ doe/osa/  needs to achieve and maintain neg calorie balance > f/u primary care

## 2014-10-21 NOTE — Assessment & Plan Note (Signed)
See osa comments/ no other w/u needed at this point

## 2014-10-21 NOTE — Assessment & Plan Note (Addendum)
-   10/20/2014  Walked RA x 3 laps @ 185 ft each stopped due to end of study/slow pace, no desats  Despite his massive obesity  has no obvious pulmonary problem at this point that would prohibit him from having the Morphine pump repaired but is at high risk of post op complications including worse atx/ resp failure esp if lies flat and may need bipap bridge so should be done in one of our hospitals. Not in an outpt clinic.  Would minimize opioids post op and max mobilization / IS to help offset effects of poor abd compliance on resp mechanics but not need for any other form of pulmonary intervention likely to help other than longterm wt loss.   Total time counseling  = [id number]  Reviewed case with pt/ studies to date/ discussion risk/ benefits/ alternatives to present plan/ giving and going over instructions in detail(see avs)

## 2014-10-21 NOTE — Assessment & Plan Note (Signed)
Because he is not developing polycthemia he either needs to wear the cpap he has , return to Dr Earl Galasborne, or see one of our sleep docs but this is not per se a contraindication to surgery surgery.

## 2014-10-23 ENCOUNTER — Telehealth: Payer: Self-pay | Admitting: *Deleted

## 2014-10-23 NOTE — Telephone Encounter (Signed)
LMTCB

## 2014-10-23 NOTE — Progress Notes (Signed)
Quick Note:  Spoke with pt and notified of results per Dr. Wert. Pt verbalized understanding and denied any questions.  ______ 

## 2014-10-23 NOTE — Telephone Encounter (Signed)
-----   Message from Nyoka CowdenMichael B Wert, MD sent at 10/21/2014  7:49 AM EDT -----   ----- Message -----    From: Nyoka CowdenMichael B Wert, MD    Sent: 10/21/2014   7:46 AM      To: Nyoka CowdenMichael B Wert, MD  Forgot to tell him that due to blood beginning to get thick needs to use cpap > if can't tolerate it see osborne or be referred to one of our sleep doc though warn him that there is a wait for new sleep consults

## 2014-10-26 DIAGNOSIS — H00019 Hordeolum externum unspecified eye, unspecified eyelid: Secondary | ICD-10-CM | POA: Diagnosis not present

## 2014-10-26 DIAGNOSIS — E291 Testicular hypofunction: Secondary | ICD-10-CM | POA: Diagnosis not present

## 2014-10-26 DIAGNOSIS — G4730 Sleep apnea, unspecified: Secondary | ICD-10-CM | POA: Diagnosis not present

## 2014-10-31 NOTE — Telephone Encounter (Signed)
LMTCB

## 2014-11-13 ENCOUNTER — Encounter: Payer: BLUE CROSS/BLUE SHIELD | Attending: Surgery | Admitting: Dietician

## 2014-11-13 ENCOUNTER — Encounter: Payer: Self-pay | Admitting: *Deleted

## 2014-11-13 ENCOUNTER — Encounter: Payer: Self-pay | Admitting: Dietician

## 2014-11-13 DIAGNOSIS — Z713 Dietary counseling and surveillance: Secondary | ICD-10-CM | POA: Diagnosis not present

## 2014-11-13 DIAGNOSIS — Z6841 Body Mass Index (BMI) 40.0 and over, adult: Secondary | ICD-10-CM | POA: Insufficient documentation

## 2014-11-13 NOTE — Progress Notes (Signed)
  Supervised Weight Loss:  Appt start time: 915 end time:  930  SWL visit 6:  Primary concerns today: [name] returns having gained 3 pounds since last visit. Has been busy with medical issues. Has been working on not drinking during meals. Still working on having breakfast and lunch. Has switched to decaf coffee. Has been chewing well. Still eating meals overnight (does not sleep well).   Does not move a lot d/t pain. Has done years of physical therapy.   Goals:  -Work on having breakfast every morning AND lunch -Try to not eat at night so that you are able to eat to during the day  -Avoid drinking 15 minutes before, during, and up through 30 minutes after a meal  Call [facility] at [phone] when surgery is scheduled to enroll in Pre-Op Class  Weight: 420.4 lbs BMI: 60.3  MEDICATIONS: see list  DIETARY INTAKE:  24-hr recall:  B (AM): water and black coffee or diet soda  Snk ( AM) :none  L (11-3 PM): bologna and cheese sandwich or nachos or leftovers  Snk ( PM): none D (PM): chicken and rice or spaghetti or tacos or hamburgers or fish with vegetables  Snk (12-3 AM): fruit or non sweet cereal  Beverages: black coffee, water, diet soda, occasionally orange juice  Recent physical activity: none  Estimated energy needs: 2000-2200 calories  Progress Towards Goal(s):  In progress.   Nutritional Diagnosis:  [place]-3.3 Overweight/obesity related to past poor dietary habits and physical inactivity as evidenced by patient in SWL for pending bariatric surgery following dietary guidelines for continued weight loss.    Intervention:  Nutrition counseling provided.  Patient-Centered Goals: -Better mobility -More energy  Scale of 1-10: confidence(9) / importance scale (10)   Monitoring/Evaluation:  Dietary intake, exercise, and body weight for Pre-Op Class

## 2014-11-13 NOTE — Patient Instructions (Addendum)
Goals:  -Work on having breakfast every morning AND lunch -Try to not eat at night so that you are able to eat to during the day  -Avoid drinking 15 minutes before, during, and up through 30 minutes after a meal  Call [facility] at [phone] when surgery is scheduled to enroll in Pre-Op Class

## 2014-11-13 NOTE — Telephone Encounter (Signed)
LMTCB and letter mailed  

## 2014-11-20 ENCOUNTER — Telehealth: Payer: Self-pay | Admitting: Internal Medicine

## 2014-11-20 NOTE — Telephone Encounter (Signed)
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

## 2014-11-21 NOTE — Telephone Encounter (Addendum)
*  Message closed in error*  Pt aware of rec's per MW- states that he will start CPAP back and will follow up with Dr Particia Lather Nothing further needed.

## 2014-12-11 ENCOUNTER — Ambulatory Visit: Payer: Self-pay | Admitting: Surgery

## 2014-12-25 ENCOUNTER — Ambulatory Visit: Payer: Self-pay

## 2015-01-01 ENCOUNTER — Encounter: Payer: BLUE CROSS/BLUE SHIELD | Attending: Surgery

## 2015-01-01 DIAGNOSIS — Z713 Dietary counseling and surveillance: Secondary | ICD-10-CM | POA: Insufficient documentation

## 2015-01-01 DIAGNOSIS — Z6841 Body Mass Index (BMI) 40.0 and over, adult: Secondary | ICD-10-CM | POA: Insufficient documentation

## 2015-01-01 NOTE — Progress Notes (Signed)
  Pre-Operative Nutrition Class:  Appt start time: 830   End time:  930.  Patient was seen on 01/01/15 for Pre-Operative Bariatric Surgery [facility].   Surgery date: 01/08/15 Surgery type: Sleeve Gastrectomy [facility] weight at [facility]: 425.5 lbs on 06/05/14  Weight today: 405 lbs  TANITA  BODY COMP RESULTS  01/01/15   BMI (kg/m^2) 58.1   Fat Mass (lbs) 287.5   Fat Free Mass (lbs) 117.5   Total Body Water (lbs) 86   Samples given per MNT protocol. Patient educated on appropriate usage: Unjury protein powder (chicken soup - qty 1) Lot #: [id number] Exp: 10/2015  Celebrate calcium citrate chew (caramel - qty 1)  Lot #: [id number] Exp: 06/2016  PB2 (chocolate - qty 1) Lot #: N/[id number] Exp: 02/2015  Premier protein shake (Strawberry - qty 1) Lot #: [id number] Exp: 12/2014  Celebrate Multivitamin chew (orange - qty 1) Lot #: [id number] Exp: 06/2016   The following the learning objectives were met by the patient during this course:  Identify Pre-Op Dietary Goals and will begin 2 weeks pre-operatively  Identify appropriate sources of fluids and proteins   State protein recommendations and appropriate sources pre and post-operatively  Identify Post-Operative Dietary Goals and will follow for 2 weeks post-operatively  Identify appropriate multivitamin and calcium sources  Describe the need for physical activity post-operatively and will follow MD recommendations  State when to call healthcare provider regarding medication questions or post-operative complications  Handouts given during class include:  Pre-Op Bariatric Surgery Diet Handout  Protein Shake Handout  Post-Op Bariatric Surgery Nutrition Handout  [facility] Program Information Flyer  Support Group Information Flyer  [facility] Bariatric Supplements Price List  Follow-Up Plan: Patient will follow-up at [facility] 2 weeks post operatively for diet advancement per MD.

## 2015-01-04 ENCOUNTER — Ambulatory Visit (HOSPITAL_COMMUNITY)
Admission: RE | Admit: 2015-01-04 | Discharge: 2015-01-04 | Disposition: A | Discharge status: Home / Self Care | Payer: BLUE CROSS/BLUE SHIELD | Source: Ambulatory Visit | Attending: Anesthesiology | Admitting: Anesthesiology

## 2015-01-04 ENCOUNTER — Encounter (HOSPITAL_COMMUNITY): Payer: Self-pay

## 2015-01-04 ENCOUNTER — Encounter (HOSPITAL_COMMUNITY)
Admission: RE | Admit: 2015-01-04 | Discharge: 2015-01-04 | Disposition: A | Discharge status: Home / Self Care | Payer: BLUE CROSS/BLUE SHIELD | Source: Ambulatory Visit | Attending: Surgery | Admitting: Surgery

## 2015-01-04 DIAGNOSIS — Z01818 Encounter for other preprocedural examination: Secondary | ICD-10-CM

## 2015-01-04 DIAGNOSIS — I517 Cardiomegaly: Secondary | ICD-10-CM | POA: Insufficient documentation

## 2015-01-04 DIAGNOSIS — Z6841 Body Mass Index (BMI) 40.0 and over, adult: Secondary | ICD-10-CM | POA: Insufficient documentation

## 2015-01-04 DIAGNOSIS — Z01812 Encounter for preprocedural laboratory examination: Secondary | ICD-10-CM | POA: Diagnosis not present

## 2015-01-04 HISTORY — DX: Unspecified osteoarthritis, unspecified site: M19.90

## 2015-01-04 HISTORY — DX: Personal history of other diseases of the digestive system: Z87.19

## 2015-01-04 HISTORY — DX: Reserved for inherently not codable concepts without codable children: IMO0001

## 2015-01-04 LAB — CBC WITH DIFFERENTIAL/PLATELET
Basophils Absolute: 0 10*3/uL (ref 0.0–0.1)
Basophils Relative: 0 %
Eosinophils Absolute: 0.3 10*3/uL (ref 0.0–0.7)
Eosinophils Relative: 3 %
HEMATOCRIT: 54.8 % — AB (ref 39.0–52.0)
HEMOGLOBIN: 17.2 g/dL — AB (ref 13.0–17.0)
LYMPHS ABS: 2 10*3/uL (ref 0.7–4.0)
LYMPHS PCT: 25 %
MCH: 28.2 pg (ref 26.0–34.0)
MCHC: 31.4 g/dL (ref 30.0–36.0)
MCV: 89.8 fL (ref 78.0–100.0)
MONOS PCT: 6 %
Monocytes Absolute: 0.5 10*3/uL (ref 0.1–1.0)
NEUTROS ABS: 5.3 10*3/uL (ref 1.7–7.7)
NEUTROS PCT: 66 %
Platelets: 207 10*3/uL (ref 150–400)
RBC: 6.1 MIL/uL — AB (ref 4.22–5.81)
RDW: 15.7 % — ABNORMAL HIGH (ref 11.5–15.5)
WBC: 8.1 10*3/uL (ref 4.0–10.5)

## 2015-01-04 LAB — COMPREHENSIVE METABOLIC PANEL
ALK PHOS: 60 U/L (ref 38–126)
ALT: 22 U/L (ref 17–63)
ANION GAP: 9 (ref 5–15)
AST: 23 U/L (ref 15–41)
Albumin: 3.9 g/dL (ref 3.5–5.0)
BILIRUBIN TOTAL: 0.5 mg/dL (ref 0.3–1.2)
BUN: 16 mg/dL (ref 6–20)
CALCIUM: 9.4 mg/dL (ref 8.9–10.3)
CO2: 32 mmol/L (ref 22–32)
CREATININE: 0.87 mg/dL (ref 0.61–1.24)
Chloride: 98 mmol/L — ABNORMAL LOW (ref 101–111)
GFR calc non Af Amer: >60 mL/min (ref >60)
GLUCOSE: 119 mg/dL — AB (ref 65–99)
Potassium: 5.3 mmol/L — ABNORMAL HIGH (ref 3.5–5.1)
Sodium: 139 mmol/L (ref 135–145)
TOTAL PROTEIN: 7.7 g/dL (ref 6.5–8.1)

## 2015-01-04 NOTE — Progress Notes (Signed)
10-20-14 - LOV - Dr. Sherene Sires (p[place]) - EPIC 10-20-14 - 2V CXR - EPIC 05-15-14 - EKG - EPIC 05-15-14 - UGI w/KUB - EPIC

## 2015-01-04 NOTE — Progress Notes (Signed)
01-04-15 - Talked to [place] in Forensic psychologist.  Need Bari bed with trapeze for pt. After surgery on 01-08-15.  Order placed in EPIC

## 2015-01-04 NOTE — Patient Instructions (Addendum)
URA HAUSEN  01/04/2015   Your procedure is scheduled on: January 08, 2015  Report to [facility] Main  Entrance take [place]  elevators to 3rd floor to  Short Stay Center at 5:15 AM.  Call this number if you have problems the morning of surgery [phone]   Remember: ONLY 1 PERSON MAY GO WITH YOU TO SHORT STAY TO GET  READY MORNING OF YOUR SURGERY.  Do not eat food or drink liquids :After Midnight.              Bring mask and tubing from c-pap machine morning of surgery     Take these medicines the morning of surgery with A SIP OF WATER:  Nexium, Gabapentin, Methadone, Oxybutynin (Ditropan-XL) DO NOT TAKE ANY DIABETIC MEDICATIONS DAY OF YOUR SURGERY                               You may not have any metal on your body including hair pins and              piercings  Do not wear jewelry, , lotions, powders or perfumes, deodorant         .  Do not shave  48 hours prior to surgery.              Men may shave face and neck.   Do not bring valuables to the hospital. [facility] IS NOT             RESPONSIBLE   FOR VALUABLES.  Contacts, dentures or bridgework may not be worn into surgery.  Leave suitcase in the car. After surgery it may be brought to your room.     :  Special Instructions: coughing and deep breathing exercises, leg exercises              Please read over the following fact sheets you were given: _____________________________________________________________________             [facility] - Preparing for Surgery Before surgery, you can play an important role.  Because skin is not sterile, your skin needs to be as free of germs as possible.  You can reduce the number of germs on your skin by washing with CHG (chlorahexidine gluconate) soap before surgery.  CHG is an antiseptic cleaner which kills germs and bonds with the skin to continue killing germs even after washing. Please DO NOT use if you have an allergy to CHG or antibacterial  soaps.  If your skin becomes reddened/irritated stop using the CHG and inform your nurse when you arrive at Short Stay. Do not shave (including legs and underarms) for at least 48 hours prior to the first CHG shower.  You may shave your face/neck. Please follow these instructions carefully:  1.  Shower with CHG Soap the night before surgery and the  morning of Surgery.  2.  If you choose to wash your hair, wash your hair first as usual with your  normal  shampoo.  3.  After you shampoo, rinse your hair and body thoroughly to remove the  shampoo.                           4.  Use CHG as you would any other liquid soap.  You can apply chg directly  to the skin and wash                       Gently with a scrungie or clean washcloth.  5.  Apply the CHG Soap to your body ONLY FROM THE NECK DOWN.   Do not use on face/ open                           Wound or open sores. Avoid contact with eyes, ears mouth and genitals (private parts).                       Wash face,  Genitals (private parts) with your normal soap.             6.  Wash thoroughly, paying special attention to the area where your surgery  will be performed.  7.  Thoroughly rinse your body with warm water from the neck down.  8.  DO NOT shower/wash with your normal soap after using and rinsing off  the CHG Soap.                9.  Pat yourself dry with a clean towel.            10.  Wear clean pajamas.            11.  Place clean sheets on your bed the night of your first shower and do not  sleep with pets. Day of Surgery : Do not apply any lotions/deodorants the morning of surgery.  Please wear clean clothes to the hospital/surgery center.  FAILURE TO FOLLOW THESE INSTRUCTIONS MAY RESULT IN THE CANCELLATION OF YOUR SURGERY PATIENT SIGNATURE_________________________________  NURSE SIGNATURE__________________________________  ________________________________________________________________________

## 2015-01-05 NOTE — Progress Notes (Signed)
01-04-15 - at preop appointment, patient stated he had a morphine pump.  Dr. Thyra Breed at [facility] [phone])  manages the pump.  Called clinic and left message on the nurse line for [facility], Dr. Vear Clock nurse, to fax me the amount of morphine that is released from the pump in a 24 hour period.   I also faxed a request to the Pain Management  - attention Victorino Dike - for the same information to be faxed to me.  Have not heard back.  Called [facility] September 23 at 4:15, but the office closes at 1300 on Fridays.

## 2015-01-08 ENCOUNTER — Inpatient Hospital Stay (HOSPITAL_COMMUNITY)
Admission: RE | Admit: 2015-01-08 | Discharge: 2015-01-10 | DRG: 621 | Disposition: A | Discharge status: Home / Self Care | Payer: BLUE CROSS/BLUE SHIELD | Source: Ambulatory Visit | Attending: Surgery | Admitting: Surgery

## 2015-01-08 ENCOUNTER — Encounter (HOSPITAL_COMMUNITY): Payer: Self-pay | Admitting: *Deleted

## 2015-01-08 ENCOUNTER — Encounter (HOSPITAL_COMMUNITY): Payer: Self-pay

## 2015-01-08 ENCOUNTER — Encounter (HOSPITAL_COMMUNITY)
Admission: RE | Disposition: A | Discharge status: Home / Self Care | Payer: Self-pay | Source: Ambulatory Visit | Attending: Surgery

## 2015-01-08 ENCOUNTER — Inpatient Hospital Stay (HOSPITAL_COMMUNITY): Payer: BLUE CROSS/BLUE SHIELD | Admitting: Certified Registered Nurse Anesthetist

## 2015-01-08 DIAGNOSIS — G8929 Other chronic pain: Secondary | ICD-10-CM | POA: Diagnosis present

## 2015-01-08 DIAGNOSIS — D751 Secondary polycythemia: Secondary | ICD-10-CM | POA: Diagnosis present

## 2015-01-08 DIAGNOSIS — G4733 Obstructive sleep apnea (adult) (pediatric): Secondary | ICD-10-CM | POA: Diagnosis present

## 2015-01-08 DIAGNOSIS — K219 Gastro-esophageal reflux disease without esophagitis: Secondary | ICD-10-CM | POA: Diagnosis present

## 2015-01-08 DIAGNOSIS — Z6841 Body Mass Index (BMI) 40.0 and over, adult: Secondary | ICD-10-CM | POA: Diagnosis not present

## 2015-01-08 DIAGNOSIS — M549 Dorsalgia, unspecified: Secondary | ICD-10-CM | POA: Diagnosis present

## 2015-01-08 DIAGNOSIS — Z9884 Bariatric surgery status: Secondary | ICD-10-CM

## 2015-01-08 DIAGNOSIS — Z79899 Other long term (current) drug therapy: Secondary | ICD-10-CM | POA: Diagnosis not present

## 2015-01-08 DIAGNOSIS — Z9889 Other specified postprocedural states: Secondary | ICD-10-CM | POA: Diagnosis not present

## 2015-01-08 HISTORY — PX: LAPAROSCOPIC GASTRIC SLEEVE RESECTION: SHX5895

## 2015-01-08 LAB — MRSA PCR SCREENING: MRSA BY PCR: NEGATIVE

## 2015-01-08 LAB — BLOOD GAS, ARTERIAL
Acid-Base Excess: 2.5 mmol/L — ABNORMAL HIGH (ref 0.0–2.0)
Bicarbonate: 30.8 mEq/L — ABNORMAL HIGH (ref 20.0–24.0)
Delivery systems: POSITIVE
Drawn by: 257701
EXPIRATORY PAP: 8
FIO2: 0.6
INSPIRATORY PAP: 12
MODE: POSITIVE
O2 SAT: 97.7 %
PATIENT TEMPERATURE: 37
PCO2 ART: 63.1 mmHg — AB (ref 35.0–45.0)
PO2 ART: 123 mmHg — AB (ref 80.0–100.0)
TCO2: 26.4 mmol/L (ref 0–100)
pH, Arterial: 7.31 — ABNORMAL LOW (ref 7.350–7.450)

## 2015-01-08 LAB — CBC
HEMATOCRIT: 53.9 % — AB (ref 39.0–52.0)
HEMOGLOBIN: 17.6 g/dL — AB (ref 13.0–17.0)
MCH: 28.8 pg (ref 26.0–34.0)
MCHC: 32.7 g/dL (ref 30.0–36.0)
MCV: 88.2 fL (ref 78.0–100.0)
Platelets: 197 10*3/uL (ref 150–400)
RBC: 6.11 MIL/uL — AB (ref 4.22–5.81)
RDW: 15.4 % (ref 11.5–15.5)
WBC: 7.8 10*3/uL (ref 4.0–10.5)

## 2015-01-08 LAB — CREATININE, SERUM
Creatinine, Ser: 0.92 mg/dL (ref 0.61–1.24)
GFR calc non Af Amer: >60 mL/min (ref >60)

## 2015-01-08 LAB — HEMOGLOBIN AND HEMATOCRIT, BLOOD
HEMATOCRIT: 53.3 % — AB (ref 39.0–52.0)
HEMOGLOBIN: 17.8 g/dL — AB (ref 13.0–17.0)

## 2015-01-08 SURGERY — GASTRECTOMY, SLEEVE, LAPAROSCOPIC
Anesthesia: General

## 2015-01-08 MED ORDER — ONDANSETRON HCL 4 MG/2ML IJ SOLN
4.0000 mg | INTRAMUSCULAR | Status: DC | PRN
  Administered 2015-01-08 – 2015-01-09 (×3): 4 mg via INTRAVENOUS
  Filled 2015-01-08 (×2): qty 2

## 2015-01-08 MED ORDER — 0.9 % SODIUM CHLORIDE (POUR BTL) OPTIME
TOPICAL | Status: DC | PRN
  Administered 2015-01-08: 1000 mL

## 2015-01-08 MED ORDER — ONDANSETRON HCL 4 MG/2ML IJ SOLN
INTRAMUSCULAR | Status: DC | PRN
  Administered 2015-01-08: 4 mg via INTRAVENOUS

## 2015-01-08 MED ORDER — HEPARIN SODIUM (PORCINE) 5000 UNIT/ML IJ SOLN
5000.0000 [IU] | INTRAMUSCULAR | Status: AC
  Administered 2015-01-08: 5000 [IU] via SUBCUTANEOUS
  Filled 2015-01-08: qty 1

## 2015-01-08 MED ORDER — LACTATED RINGERS IV SOLN
INTRAVENOUS | Status: DC | PRN
  Administered 2015-01-08 (×2): via INTRAVENOUS

## 2015-01-08 MED ORDER — METOCLOPRAMIDE HCL 5 MG/ML IJ SOLN
INTRAMUSCULAR | Status: DC | PRN
  Administered 2015-01-08: 10 mg via INTRAVENOUS

## 2015-01-08 MED ORDER — HEPARIN SODIUM (PORCINE) 5000 UNIT/ML IJ SOLN
5000.0000 [IU] | Freq: Three times a day (TID) | INTRAMUSCULAR | Status: DC
  Administered 2015-01-08 – 2015-01-10 (×5): 5000 [IU] via SUBCUTANEOUS
  Filled 2015-01-08 (×8): qty 1

## 2015-01-08 MED ORDER — HYDROMORPHONE HCL 2 MG/ML IJ SOLN
INTRAMUSCULAR | Status: AC
  Filled 2015-01-08: qty 1

## 2015-01-08 MED ORDER — BUPIVACAINE LIPOSOME 1.3 % IJ SUSP
INTRAMUSCULAR | Status: DC | PRN
  Administered 2015-01-08: 20 mL

## 2015-01-08 MED ORDER — SCOPOLAMINE 1 MG/3DAYS TD PT72
MEDICATED_PATCH | TRANSDERMAL | Status: AC
  Filled 2015-01-08: qty 1

## 2015-01-08 MED ORDER — ACETAMINOPHEN 160 MG/5ML PO SOLN: 325.0000 mg | ORAL | Status: DC | PRN

## 2015-01-08 MED ORDER — DEXAMETHASONE SODIUM PHOSPHATE 4 MG/ML IJ SOLN
INTRAMUSCULAR | Status: DC | PRN
  Administered 2015-01-08: 4 mg via INTRAVENOUS

## 2015-01-08 MED ORDER — ONDANSETRON HCL 4 MG/2ML IJ SOLN
INTRAMUSCULAR | Status: AC
  Filled 2015-01-08: qty 2

## 2015-01-08 MED ORDER — SCOPOLAMINE 1 MG/3DAYS TD PT72
MEDICATED_PATCH | TRANSDERMAL | Status: DC | PRN
  Administered 2015-01-08: 1 via TRANSDERMAL

## 2015-01-08 MED ORDER — KETAMINE HCL 10 MG/ML IJ SOLN
INTRAMUSCULAR | Status: DC | PRN
  Administered 2015-01-08 (×2): 50 mg via INTRAVENOUS

## 2015-01-08 MED ORDER — ACETAMINOPHEN 10 MG/ML IV SOLN
1000.0000 mg | Freq: Once | INTRAVENOUS | Status: AC
  Administered 2015-01-08: 1000 mg via INTRAVENOUS

## 2015-01-08 MED ORDER — SUCCINYLCHOLINE CHLORIDE 20 MG/ML IJ SOLN
INTRAMUSCULAR | Status: DC | PRN
  Administered 2015-01-08: 160 mg via INTRAVENOUS

## 2015-01-08 MED ORDER — NEOSTIGMINE METHYLSULFATE 10 MG/10ML IV SOLN
INTRAVENOUS | Status: AC
  Filled 2015-01-08: qty 1

## 2015-01-08 MED ORDER — ROCURONIUM BROMIDE 100 MG/10ML IV SOLN
INTRAVENOUS | Status: AC
  Filled 2015-01-08: qty 1

## 2015-01-08 MED ORDER — HYDROMORPHONE HCL 1 MG/ML IJ SOLN: 0.2500 mg | INTRAMUSCULAR | Status: DC | PRN

## 2015-01-08 MED ORDER — GLYCOPYRROLATE 0.2 MG/ML IJ SOLN
INTRAMUSCULAR | Status: DC | PRN
  Administered 2015-01-08: 0.2 mg via INTRAVENOUS
  Administered 2015-01-08: 0.6 mg via INTRAVENOUS

## 2015-01-08 MED ORDER — UNJURY CHOCOLATE CLASSIC POWDER: 2.0000 [oz_av] | Freq: Four times a day (QID) | ORAL | Status: DC

## 2015-01-08 MED ORDER — PROPOFOL 10 MG/ML IV BOLUS
INTRAVENOUS | Status: DC | PRN
  Administered 2015-01-08: 200 mg via INTRAVENOUS

## 2015-01-08 MED ORDER — UNJURY VANILLA POWDER
2.0000 [oz_av] | Freq: Four times a day (QID) | ORAL | Status: DC
  Administered 2015-01-10: 2 [oz_av] via ORAL

## 2015-01-08 MED ORDER — GLYCOPYRROLATE 0.2 MG/ML IJ SOLN
INTRAMUSCULAR | Status: AC
  Filled 2015-01-08: qty 4

## 2015-01-08 MED ORDER — OXYCODONE HCL 5 MG/5ML PO SOLN: 5.0000 mg | ORAL | Status: DC | PRN

## 2015-01-08 MED ORDER — DEXAMETHASONE SODIUM PHOSPHATE 10 MG/ML IJ SOLN
INTRAMUSCULAR | Status: AC
  Filled 2015-01-08: qty 1

## 2015-01-08 MED ORDER — SODIUM CHLORIDE 0.9 % IJ SOLN
INTRAMUSCULAR | Status: DC | PRN
  Administered 2015-01-08: 20 mL via INTRAVENOUS

## 2015-01-08 MED ORDER — PROPOFOL 10 MG/ML IV BOLUS
INTRAVENOUS | Status: AC
  Filled 2015-01-08: qty 20

## 2015-01-08 MED ORDER — MORPHINE SULFATE (PF) 4 MG/ML IV SOLN
4.0000 mg | INTRAVENOUS | Status: DC | PRN
  Administered 2015-01-09: 4 mg via INTRAVENOUS
  Filled 2015-01-08: qty 1

## 2015-01-08 MED ORDER — KCL IN DEXTROSE-NACL 20-5-0.45 MEQ/L-%-% IV SOLN
INTRAVENOUS | Status: DC
  Administered 2015-01-08 – 2015-01-09 (×4): via INTRAVENOUS
  Filled 2015-01-08 (×8): qty 1000

## 2015-01-08 MED ORDER — HYDRALAZINE HCL 20 MG/ML IJ SOLN
INTRAMUSCULAR | Status: AC
  Filled 2015-01-08: qty 1

## 2015-01-08 MED ORDER — DEXTROSE 5 % IV SOLN
2.0000 g | INTRAVENOUS | Status: AC
  Administered 2015-01-08 (×2): 2 g via INTRAVENOUS

## 2015-01-08 MED ORDER — CHLORHEXIDINE GLUCONATE CLOTH 2 % EX PADS: 6.0000 | MEDICATED_PAD | Freq: Once | CUTANEOUS | Status: DC

## 2015-01-08 MED ORDER — HYDROMORPHONE HCL 1 MG/ML IJ SOLN
INTRAMUSCULAR | Status: DC | PRN
  Administered 2015-01-08: 1 mg via INTRAVENOUS

## 2015-01-08 MED ORDER — KETAMINE HCL 10 MG/ML IJ SOLN
INTRAMUSCULAR | Status: AC
  Filled 2015-01-08: qty 1

## 2015-01-08 MED ORDER — FENTANYL CITRATE (PF) 250 MCG/5ML IJ SOLN
INTRAMUSCULAR | Status: AC
  Filled 2015-01-08: qty 25

## 2015-01-08 MED ORDER — SODIUM CHLORIDE 0.9 % IJ SOLN
INTRAMUSCULAR | Status: AC
  Filled 2015-01-08: qty 10

## 2015-01-08 MED ORDER — DIPHENHYDRAMINE HCL 50 MG/ML IJ SOLN
INTRAMUSCULAR | Status: AC
  Filled 2015-01-08: qty 1

## 2015-01-08 MED ORDER — ACETAMINOPHEN 160 MG/5ML PO SOLN: 650.0000 mg | ORAL | Status: DC | PRN

## 2015-01-08 MED ORDER — SODIUM CHLORIDE 0.9 % IJ SOLN
INTRAMUSCULAR | Status: AC
  Filled 2015-01-08: qty 20

## 2015-01-08 MED ORDER — NEOSTIGMINE METHYLSULFATE 10 MG/10ML IV SOLN
INTRAVENOUS | Status: DC | PRN
  Administered 2015-01-08: 5 mg via INTRAVENOUS

## 2015-01-08 MED ORDER — DEXTROSE 5 % IV SOLN
INTRAVENOUS | Status: AC
  Filled 2015-01-08: qty 2

## 2015-01-08 MED ORDER — MIDAZOLAM HCL 5 MG/5ML IJ SOLN
INTRAMUSCULAR | Status: DC | PRN
  Administered 2015-01-08: 2 mg via INTRAVENOUS

## 2015-01-08 MED ORDER — ROCURONIUM BROMIDE 100 MG/10ML IV SOLN
INTRAVENOUS | Status: DC | PRN
  Administered 2015-01-08 (×3): 20 mg via INTRAVENOUS
  Administered 2015-01-08: 50 mg via INTRAVENOUS
  Administered 2015-01-08: 20 mg via INTRAVENOUS

## 2015-01-08 MED ORDER — PROMETHAZINE HCL 25 MG/ML IJ SOLN
6.2500 mg | INTRAMUSCULAR | Status: AC | PRN
  Administered 2015-01-08 (×2): 12.5 mg via INTRAVENOUS

## 2015-01-08 MED ORDER — PROMETHAZINE HCL 25 MG/ML IJ SOLN
INTRAMUSCULAR | Status: AC
  Filled 2015-01-08: qty 1

## 2015-01-08 MED ORDER — FENTANYL CITRATE (PF) 100 MCG/2ML IJ SOLN
INTRAMUSCULAR | Status: DC | PRN
  Administered 2015-01-08: 100 ug via INTRAVENOUS
  Administered 2015-01-08 (×3): 50 ug via INTRAVENOUS

## 2015-01-08 MED ORDER — UNJURY CHICKEN SOUP POWDER: 2.0000 [oz_av] | Freq: Four times a day (QID) | ORAL | Status: DC

## 2015-01-08 MED ORDER — ACETAMINOPHEN 10 MG/ML IV SOLN
INTRAVENOUS | Status: AC
  Filled 2015-01-08: qty 100

## 2015-01-08 MED ORDER — METOCLOPRAMIDE HCL 5 MG/ML IJ SOLN
INTRAMUSCULAR | Status: AC
  Filled 2015-01-08: qty 2

## 2015-01-08 MED ORDER — PANTOPRAZOLE SODIUM 40 MG IV SOLR
40.0000 mg | Freq: Every day | INTRAVENOUS | Status: DC
  Administered 2015-01-08 – 2015-01-09 (×2): 40 mg via INTRAVENOUS
  Filled 2015-01-08 (×3): qty 40

## 2015-01-08 MED ORDER — MIDAZOLAM HCL 2 MG/2ML IJ SOLN
INTRAMUSCULAR | Status: AC
  Filled 2015-01-08: qty 4

## 2015-01-08 MED ORDER — BUPIVACAINE LIPOSOME 1.3 % IJ SUSP
20.0000 mL | Freq: Once | INTRAMUSCULAR | Status: DC
  Filled 2015-01-08: qty 20

## 2015-01-08 MED ORDER — LACTATED RINGERS IR SOLN
Status: DC | PRN
  Administered 2015-01-08: 1000 mL

## 2015-01-08 MED ORDER — CETYLPYRIDINIUM CHLORIDE 0.05 % MT LIQD
7.0000 mL | Freq: Two times a day (BID) | OROMUCOSAL | Status: DC
  Administered 2015-01-09 – 2015-01-10 (×3): 7 mL via OROMUCOSAL

## 2015-01-08 MED ORDER — CHLORHEXIDINE GLUCONATE 0.12 % MT SOLN
15.0000 mL | Freq: Two times a day (BID) | OROMUCOSAL | Status: DC
  Administered 2015-01-08 – 2015-01-10 (×3): 15 mL via OROMUCOSAL
  Filled 2015-01-08 (×4): qty 15

## 2015-01-08 MED ORDER — HYDRALAZINE HCL 20 MG/ML IJ SOLN
INTRAMUSCULAR | Status: DC | PRN
  Administered 2015-01-08: 4 mg via INTRAVENOUS

## 2015-01-08 SURGICAL SUPPLY — 70 items
APL SRG 32X5 SNPLK LF DISP (MISCELLANEOUS)
APPLICATOR COTTON TIP 6IN STRL (MISCELLANEOUS) IMPLANT
APPLIER CLIP 5 13 M/L LIGAMAX5 (MISCELLANEOUS)
APPLIER CLIP ROT 10 11.4 M/L (STAPLE)
APPLIER CLIP ROT 13.4 12 LRG (CLIP)
APR CLP LRG 13.4X12 ROT 20 MLT (CLIP)
APR CLP MED LRG 11.4X10 (STAPLE)
APR CLP MED LRG 5 ANG JAW (MISCELLANEOUS)
BLADE SURG 15 STRL LF DISP TIS (BLADE) ×1 IMPLANT
BLADE SURG 15 STRL SS (BLADE) ×3
CABLE HIGH FREQUENCY MONO STRZ (ELECTRODE) IMPLANT
CLIP APPLIE 5 13 M/L LIGAMAX5 (MISCELLANEOUS) IMPLANT
CLIP APPLIE ROT 10 11.4 M/L (STAPLE) IMPLANT
CLIP APPLIE ROT 13.4 12 LRG (CLIP) IMPLANT
COVER SURGICAL LIGHT HANDLE (MISCELLANEOUS) ×1 IMPLANT
DEVICE SUT QUICK LOAD TK 5 (STAPLE) IMPLANT
DEVICE SUT TI-KNOT TK 5X26 (MISCELLANEOUS) ×1 IMPLANT
DEVICE SUTURE ENDOST 10MM (ENDOMECHANICALS) IMPLANT
DEVICE TI KNOT TK5 (MISCELLANEOUS) ×1
DEVICE TROCAR PUNCTURE CLOSURE (ENDOMECHANICALS) ×3 IMPLANT
DISSECTOR BLUNT TIP ENDO 5MM (MISCELLANEOUS) ×3 IMPLANT
DRAPE CAMERA CLOSED 9X96 (DRAPES) ×3 IMPLANT
ELECT REM PT RETURN 9FT ADLT (ELECTROSURGICAL) ×3
ELECTRODE REM PT RTRN 9FT ADLT (ELECTROSURGICAL) ×1 IMPLANT
GAUZE SPONGE 4X4 12PLY STRL (GAUZE/BANDAGES/DRESSINGS) IMPLANT
GLOVE BIOGEL M 8.0 STRL (GLOVE) ×3 IMPLANT
GOWN STRL REUS W/TWL XL LVL3 (GOWN DISPOSABLE) ×14 IMPLANT
HANDLE STAPLE EGIA 4 XL (STAPLE) ×3 IMPLANT
HOVERMATT SINGLE USE (MISCELLANEOUS) ×3 IMPLANT
KIT BASIN OR (CUSTOM PROCEDURE TRAY) ×3 IMPLANT
LIQUID BAND (GAUZE/BANDAGES/DRESSINGS) ×2 IMPLANT
NDL SPNL 22GX3.5 QUINCKE BK (NEEDLE) ×1 IMPLANT
NEEDLE SPNL 22GX3.5 QUINCKE BK (NEEDLE) ×3 IMPLANT
PACK UNIVERSAL I (CUSTOM PROCEDURE TRAY) ×3 IMPLANT
PEN SKIN MARKING BROAD (MISCELLANEOUS) ×3 IMPLANT
QUICK LOAD TK 5 (STAPLE)
RELOAD STAPLE 45 PURP MED/THCK (STAPLE) IMPLANT
RELOAD TRI 45 ART MED THCK BLK (STAPLE) ×1 IMPLANT
RELOAD TRI 45 ART MED THCK PUR (STAPLE) IMPLANT
RELOAD TRI 60 ART MED THCK BLK (STAPLE) ×7 IMPLANT
RELOAD TRI 60 ART MED THCK PUR (STAPLE) ×7 IMPLANT
SCISSORS LAP 5X45 EPIX DISP (ENDOMECHANICALS) ×2 IMPLANT
SCRUB PCMX 4 OZ (MISCELLANEOUS) ×4 IMPLANT
SEALANT SURGICAL APPL DUAL CAN (MISCELLANEOUS) IMPLANT
SET IRRIG TUBING LAPAROSCOPIC (IRRIGATION / IRRIGATOR) ×3 IMPLANT
SHEARS CURVED HARMONIC AC 45CM (MISCELLANEOUS) ×3 IMPLANT
SLEEVE ADV FIXATION 5X100MM (TROCAR) ×6 IMPLANT
SLEEVE GASTRECTOMY 36FR VISIGI (MISCELLANEOUS) ×3 IMPLANT
SOLUTION ANTI FOG 6CC (MISCELLANEOUS) ×3 IMPLANT
SPONGE LAP 18X18 X RAY DECT (DISPOSABLE) ×3 IMPLANT
STAPLER VISISTAT 35W (STAPLE) ×3 IMPLANT
SUT SURGIDAC NAB ES-9 0 48 120 (SUTURE) IMPLANT
SUT VIC AB 4-0 SH 18 (SUTURE) ×3 IMPLANT
SUT VICRYL 0 TIES 12 18 (SUTURE) ×3 IMPLANT
SYR 10ML ECCENTRIC (SYRINGE) ×3 IMPLANT
SYR 20CC LL (SYRINGE) ×3 IMPLANT
SYR 50ML LL SCALE MARK (SYRINGE) ×3 IMPLANT
TOWEL OR 17X26 10 PK STRL BLUE (TOWEL DISPOSABLE) ×6 IMPLANT
TOWEL OR NON WOVEN STRL DISP B (DISPOSABLE) ×3 IMPLANT
TRAY FOLEY W/METER SILVER 14FR (SET/KITS/TRAYS/PACK) IMPLANT
TRAY FOLEY W/METER SILVER 16FR (SET/KITS/TRAYS/PACK) IMPLANT
TROCAR ADV FIXATION 12X100MM (TROCAR) ×3 IMPLANT
TROCAR ADV FIXATION 5X100MM (TROCAR) ×3 IMPLANT
TROCAR BLADELESS 15MM (ENDOMECHANICALS) ×3 IMPLANT
TROCAR BLADELESS OPT 5 100 (ENDOMECHANICALS) ×3 IMPLANT
TUBE CALIBRATION LAPBAND (TUBING) ×2 IMPLANT
TUBING CONNECTING 10 (TUBING) ×2 IMPLANT
TUBING CONNECTING 10' (TUBING) ×1
TUBING ENDO SMARTCAP (MISCELLANEOUS) ×3 IMPLANT
TUBING FILTER THERMOFLATOR (ELECTROSURGICAL) ×3 IMPLANT

## 2015-01-08 NOTE — Anesthesia Preprocedure Evaluation (Signed)
Anesthesia Evaluation  Patient identified by MRN, date of birth, ID band Patient awake    Reviewed: Allergy & Precautions, NPO status , Patient's Chart, lab work & pertinent test results  Airway Mallampati: II  TM Distance: <3 FB Neck ROM: Full    Dental no notable dental hx.    Pulmonary asthma ,    breath sounds clear to auscultation + decreased breath sounds      Cardiovascular negative cardio ROS Normal cardiovascular exam Rhythm:Regular Rate:Normal     Neuro/Psych negative neurological ROS  negative psych ROS   GI/Hepatic negative GI ROS, Neg liver ROS, GERD  Medicated,  Endo/Other  Morbid obesity  Renal/GU negative Renal ROS  negative genitourinary   Musculoskeletal negative musculoskeletal ROS (+)   Abdominal (+) + obese,   Peds negative pediatric ROS (+)  Hematology negative hematology ROS (+)   Anesthesia Other Findings   Reproductive/Obstetrics negative OB ROS                             Anesthesia Physical Anesthesia Plan  ASA: III  Anesthesia Plan: General   Post-op Pain Management:    Induction: Intravenous  Airway Management Planned: Oral ETT  Additional Equipment:   Intra-op Plan:   Post-operative Plan: Extubation in OR  Informed Consent: I have reviewed the patients History and Physical, chart, labs and discussed the procedure including the risks, benefits and alternatives for the proposed anesthesia with the patient or authorized representative who has indicated his/her understanding and acceptance.   Dental advisory given  Plan Discussed with: CRNA and Surgeon  Anesthesia Plan Comments:         Anesthesia Quick Evaluation

## 2015-01-08 NOTE — Transfer of Care (Signed)
Immediate Anesthesia Transfer of Care Note  Patient: [name]  Procedure(s) Performed: Procedure(s): LAPAROSCOPIC GASTRIC SLEEVE RESECTION (N/A)  Patient Location: PACU  Anesthesia Type:General  Level of Consciousness: Patient easily awoken, sedated, comfortable, cooperative, following commands, responds to stimulation.   Airway & Oxygen Therapy: Patient spontaneously breathing, ventilating well, oxygen via simple oxygen mask.  Post-op Assessment: Report given to PACU RN, vital signs reviewed and stable, moving all extremities.   Post vital signs: Reviewed and stable.  Complications: No apparent anesthesia complications

## 2015-01-08 NOTE — H&P (Addendum)
Barbera Setters. Baxley II 12/15/2014 4:02 PM Location: [facility] Patient #: [id number] DOB: Aug 22, 1965 Married / Language: English / Race: White Male  History of Present Illness Molli Hazard B. Daphine Deutscher MD; 12/15/2014 4:43 PM) The patient is a 49 year old male who presents for a bariatric surgery evaluation. Scheduled for sleeve gastrectomy on Sept 26th. His wife was diagnosed with colon cancer in July and is seeing Estelle Grumbles. He just had the battery pack in his left upper quadrant that manages his morphine put changed by Trey Sailors.  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   He needs his bipap mask refitted so that he can have it for his upcoming surgery. Today'sl BMI is 59. He is otherwise ready for sleeve gastrectomy. I reviewed the procedure with him again and discussed the risks and potential complications. UGI showed no hiatus hernia and ultrasound showed no stones.    Allergies Doristine Devoid, CMA; 12/15/2014 4:03 PM) Tape 1"X5yd *MEDICAL DEVICES AND SUPPLIES*  Medication History Doristine Devoid, CMA; 12/15/2014 4:05 PM) Methadone HCl (  Tablet, Oral) Active. Promethazine HCl (  Tablet, Oral) Active. Oxybutynin Chloride ER (  Tablet ER 24HR, Oral) Active. Limbrel (  Capsule, Oral) Active. Gabapentin (  Tablet, Oral) Active. Morphine Sulfate ER (  Tablet ER, Oral) Active. Xanax (0.25MG  Tablet, Oral) Active. NexIUM (  Capsule DR, Oral) Active. Lasix (  Tablet, Oral) Active. Medications Reconciled  Vitals (Chemira Jones CMA; 12/15/2014 4:03 PM) 12/15/2014 4:02 PM Weight: 419.8 lb Height: 70.5in Body Surface Area: 3.08 m Body Mass Index: 59.38 kg/m Temp.: 98.33F(Oral)  BP: 140/78 (Sitting, Left Arm, Standard)    Physical Exam (Matthew B. Daphine Deutscher MD; 12/15/2014 4:45 PM) General Note: Broad chested obese white male who walks with a rolling walker secondary to this back injury (he fell from a guard  tower) HEENT unremarkable Neck supple Chest clear Heart SR without murmurs Abdomen healing scar in left upper quadrant from recent battery change Ext some bilateral chronic swelling     Assessment & Plan Molli Hazard B. Daphine Deutscher MD; 12/15/2014 4:46 PM) MORBID OBESITY (278.01  E66.01)  Plan:  Sleeve gastrectomy  Matt B. Daphine Deutscher, MD, FACS

## 2015-01-08 NOTE — Anesthesia Postprocedure Evaluation (Signed)
  Anesthesia Post-op Note  Patient: [name]  Procedure(s) Performed: Procedure(s) (LRB): LAPAROSCOPIC GASTRIC SLEEVE RESECTION (N/A)  Patient Location: PACU  Anesthesia Type: General  Level of Consciousness: awake and alert   Airway and Oxygen Therapy: Patient Spontanous Breathing  Post-op Pain: mild  Post-op Assessment: Post-op Vital signs reviewed, Patient's Cardiovascular Status Stable, Respiratory Function Stable, Patent Airway and No signs of Nausea or vomiting  Last Vitals:  Filed Vitals:   01/08/15 1228  BP: 147/71  Pulse: 79  Temp:   Resp: 22    Post-op Vital Signs: stable   Complications: No apparent anesthesia complications

## 2015-01-08 NOTE — Interval H&P Note (Signed)
History and Physical Interval Note:  01/08/2015 7:13 AM  [name]  has presented today for surgery, with the diagnosis of Morbid Obesity  The various methods of treatment have been discussed with the patient and family. After consideration of risks, benefits and other options for treatment, the patient has consented to  Procedure(s): LAPAROSCOPIC GASTRIC SLEEVE RESECTION (N/A) as a surgical intervention .  The patient's history has been reviewed, patient examined, no change in status, stable for surgery.  I have reviewed the patient's chart and labs.  Questions were answered to the patient's satisfaction.     MARTIN,MATTHEW B

## 2015-01-08 NOTE — Op Note (Signed)
[name] [id number] [birth year] 01/08/2015  Preoperative diagnosis: morbid obesity  Postoperative diagnosis: Same   Procedure: upper endoscopy   Surgeon: Mary Sella. Jaston Havens M.D., FACS   Anesthesia: Gen.   Indications for procedure: 49 year old male undergoing Laparoscopic Gastric Sleeve Resection and an EGD was requested to evaluate the new gastric sleeve.   Description of procedure: After we have completed the sleeve resection, I scrubbed out and obtained the Olympus endoscope. I gently placed endoscope in the patient's oropharynx and gently glided it down the esophagus without any difficulty under direct visualization. Once I was in the gastric sleeve, I insufflated the stomach with air. I was able to cannulate and advanced the scope through the gastric sleeve. I was able to cannulate the duodenum with ease. Dr. Daphine Deutscher had placed saline in the upper abdomen. Upon further insufflation of the gastric sleeve there was no evidence of bubbles. GE junction located at 41 cm.  Upon further inspection of the gastric sleeve, the mucosa appeared normal. There is no evidence of any mucosal abnormality. The sleeve was widely patent at the angularis. There was no evidence of bleeding. The gastric sleeve was decompressed. The scope was withdrawn. The patient tolerated this portion of the procedure well. Please see Dr Ermalene Searing operative note for details regarding the laparoscopic gastric sleeve resection.   Mary Sella. Andrey Campanile, MD, FACS  General, Bariatric, & Minimally Invasive Surgery  [facility], [place]

## 2015-01-08 NOTE — Progress Notes (Signed)
Patient unable to ambulate this evening due to frequent desaturating. MD aware. Switching to BiPAP per orders.  Will continue to monitor closely. Loletta Parish, RN

## 2015-01-08 NOTE — Progress Notes (Signed)
eLink Physician-Brief Progress Note Patient Name: KRIS NO DOB: [birth year] MRN: [id number]   Date of Service  01/08/2015  HPI/Events of Note  Pt is s/p gastric sleeve for bariatric surgery. Postop pt is hypoxic, pt on cpap but uses bipap at home  eICU Interventions  Change cpap to bipap and check ABG , keep sats >/= 90%     Intervention Category Major Interventions: Respiratory failure - evaluation and management  Shan Levans 01/08/2015, 5:15 PM

## 2015-01-08 NOTE — Anesthesia Procedure Notes (Signed)
Procedure Name: Intubation Date/Time: 01/08/2015 7:28 AM Performed by: Ludwig Lean Pre-anesthesia Checklist: Patient identified, Emergency Drugs available, Suction available and Patient being monitored Patient Re-evaluated:Patient Re-evaluated prior to inductionOxygen Delivery Method: Circle System Utilized Preoxygenation: Pre-oxygenation with 100% oxygen Intubation Type: IV induction Ventilation: Mask ventilation without difficulty and Oral airway inserted - appropriate to patient size Laryngoscope Size: Mac and 4 Grade View: Grade II Tube type: Oral Tube size: 7.5 mm Number of attempts: 1 Airway Equipment and Method: Stylet and Oral airway Placement Confirmation: ETT inserted through vocal cords under direct vision,  positive ETCO2 and breath sounds checked- equal and bilateral Secured at: 21 cm Tube secured with: Tape Dental Injury: Teeth and Oropharynx as per pre-operative assessment

## 2015-01-08 NOTE — Op Note (Signed)
Surgeon: Wenda Low, MD, FACS  Asst:  Gaynelle Adu, MD, FACS  Anes:  General endotracheal  Procedure: Laparoscopic sleeve gastrectomy and upper endoscopy  Diagnosis: Morbid obesity  Complications: None noted  EBL:   20 cc  Description of Procedure:  The patient was take to OR 1 and given general anesthesia.  The abdomen was prepped with PCMX and draped sterilely.  A timeout was performed.  Access to the abdomen was achieved with a 5 mm optiview through the left upper quadrant avoiding his indwelling pain pump. 4 more five mm trocars and one 15 mm trocar were inserted.    Following insufflation, the state of the abdomen was found to be free of adhesions. First attention was to check for a hiatus hernia with the balloon calibration tube.  10cc were used and the balloon stuck at the EG junction. No hiatus hernia was seen.   The ViSiGi 36Fr tube was inserted to deflate the stomach and was pulled back into the esophagus.    The pylorus was identified and we measured 5 cm back and marked the antrum.  At that point we began dissection to take down the greater curvature of the stomach using the Harmonic scalpel.  This dissection was taken all the way up to the left crus.  Posterior attachments of the stomach were also taken down.    The ViSiGi tube was then passed into the antrum and suction applied so that it was snug along the lessor curvature.  The "crow's foot" or incisura was identified.  The sleeve gastrectomy was begun using the [employer] stapler beginning with a 4.5 cm black load with buttress followed by multiple 6 cm black loads until the very top where two purple loads with TRS were applied.  When the sleeve was complete the tube was taken off suction and insufflated briefly.  The tube was withdrawn.  Upper endoscopy was then performed by Dr. Andrey Campanile and this showed no bleeding or bubbles.     The specimen was extracted through the 15 trocar site.  Wounds were infiltrated with  Exparel and closed with 4-0 vicryl and Dermabond.    Matt B. Daphine Deutscher, MD, [facility], [place] [phone]

## 2015-01-09 ENCOUNTER — Inpatient Hospital Stay (HOSPITAL_COMMUNITY): Payer: BLUE CROSS/BLUE SHIELD

## 2015-01-09 DIAGNOSIS — Z9889 Other specified postprocedural states: Secondary | ICD-10-CM

## 2015-01-09 LAB — CBC WITH DIFFERENTIAL/PLATELET
BASOS ABS: 0 10*3/uL (ref 0.0–0.1)
Basophils Relative: 0 %
EOS ABS: 0 10*3/uL (ref 0.0–0.7)
EOS PCT: 0 %
HCT: 52.5 % — ABNORMAL HIGH (ref 39.0–52.0)
Hemoglobin: 16.9 g/dL (ref 13.0–17.0)
LYMPHS ABS: 1.2 10*3/uL (ref 0.7–4.0)
LYMPHS PCT: 14 %
MCH: 28.7 pg (ref 26.0–34.0)
MCHC: 32.2 g/dL (ref 30.0–36.0)
MCV: 89.1 fL (ref 78.0–100.0)
MONO ABS: 0.6 10*3/uL (ref 0.1–1.0)
Monocytes Relative: 7 %
Neutro Abs: 6.6 10*3/uL (ref 1.7–7.7)
Neutrophils Relative %: 79 %
PLATELETS: 222 10*3/uL (ref 150–400)
RBC: 5.89 MIL/uL — ABNORMAL HIGH (ref 4.22–5.81)
RDW: 14.9 % (ref 11.5–15.5)
WBC: 8.4 10*3/uL (ref 4.0–10.5)

## 2015-01-09 LAB — HEMOGLOBIN AND HEMATOCRIT, BLOOD
HCT: 54.6 % — ABNORMAL HIGH (ref 39.0–52.0)
HEMOGLOBIN: 17.7 g/dL — AB (ref 13.0–17.0)

## 2015-01-09 NOTE — Care Management Note (Signed)
Case Management Note  Patient Details  Name: [name] MRN: [id number] Date of Birth: 1966/01/29  Subjective/Objective:                Gastric sleeve requiring post op bipap  Action/Plan: Date:  Sept. 27, 2016 U.R. performed for needs and level of care. Will continue to follow for Case Management needs.  Marcelle Smiling, RN, BSN, [place]   [phone] Expected Discharge Date:  01/11/15               Expected Discharge Plan:  Home/Self Care  In-House Referral:  NA  Discharge planning Services  CM Consult  Post Acute Care Choice:  NA Choice offered to:  NA  DME Arranged:    DME Agency:     HH Arranged:    HH Agency:     Status of Service:  In process, will continue to follow  Medicare Important Message Given:    Date Medicare IM Given:    Medicare IM give by:    Date Additional Medicare IM Given:    Additional Medicare Important Message give by:     If discussed at Long Length of Stay Meetings, dates discussed:    Additional Comments:  Golda Acre, RN 01/09/2015, 10:07 AM

## 2015-01-09 NOTE — Progress Notes (Signed)
Per MD order- RT removed PT from BiPAP and is now ordered PRN. PT is currently on 4 lpm Organ- Sp02 94%- RN aware.

## 2015-01-09 NOTE — Progress Notes (Signed)
Patient ID: [name], male   DOB: 03-01-1966, 49 y.o.   MRN: [id number] [facility] Progress Note:   1 Day Post-Op  Subjective: Mental status is clear;  Used BiPAP last night in stepdown Objective: Vital signs in last 24 hours: Temp:  [97.7 F (36.5 C)-99.1 F (37.3 C)] 97.7 F (36.5 C) (09/27 0800) Pulse Rate:  [44-79] 44 (09/27 0714) Resp:  [11-22] 14 (09/27 0714) BP: (103-147)/(37-75) 141/53 mmHg (09/27 0714) SpO2:  [84 %-96 %] 92 % (09/27 0714) FiO2 (%):  [60 %] 60 % (09/27 0714)  Intake/Output from previous day: 09/26 0701 - 09/27 0700 In: 3347.9 [I.V.:3347.9] Out: 2775 [Urine:2775] Intake/Output this shift: Total I/O In: 60 [P.O.:60] Out: -   Physical Exam: Work of breathing is not labored.  No abdominal pain  Lab Results:  Results for orders placed or performed during the hospital encounter of 01/08/15 (from the past 48 hour(s))  Hemoglobin and hematocrit, blood     Status: Abnormal   Collection Time: 01/08/15 10:25 AM  Result Value Ref Range   Hemoglobin 17.8 (H) 13.0 - 17.0 g/dL   HCT 53.3 (H) 39.0 - 52.0 %  CBC     Status: Abnormal   Collection Time: 01/08/15 10:25 AM  Result Value Ref Range   WBC 7.8 4.0 - 10.5 K/uL   RBC 6.11 (H) 4.22 - 5.81 MIL/uL   Hemoglobin 17.6 (H) 13.0 - 17.0 g/dL   HCT 53.9 (H) 39.0 - 52.0 %   MCV 88.2 78.0 - 100.0 fL   MCH 28.8 26.0 - 34.0 pg   MCHC 32.7 30.0 - 36.0 g/dL   RDW 15.4 11.5 - 15.5 %   Platelets 197 150 - 400 K/uL  Creatinine, serum     Status: None   Collection Time: 01/08/15 10:25 AM  Result Value Ref Range   Creatinine, Ser 0.92 0.61 - 1.24 mg/dL   GFR calc non Af Amer >60 >60 mL/min   GFR calc Af Amer >60 >60 mL/min    Comment: (NOTE) The eGFR has been calculated using the CKD EPI equation. This calculation has not been validated in all clinical situations. eGFR's persistently <60 mL/min signify possible Chronic Kidney Disease.   MRSA PCR Screening     Status: None   Collection Time:  01/08/15 11:17 AM  Result Value Ref Range   MRSA by PCR NEGATIVE NEGATIVE    Comment:        The GeneXpert MRSA Assay (FDA approved for NASAL specimens only), is one component of a comprehensive MRSA colonization surveillance program. It is not intended to diagnose MRSA infection nor to guide or monitor treatment for MRSA infections.   Blood gas, arterial     Status: Abnormal   Collection Time: 01/08/15  6:15 PM  Result Value Ref Range   FIO2 0.60    Delivery systems BILEVEL POSITIVE AIRWAY PRESSURE    Mode BILEVEL POSITIVE AIRWAY PRESSURE    Inspiratory PAP 12    Expiratory PAP 8    pH, Arterial 7.310 (L) 7.350 - 7.450   pCO2 arterial 63.1 (HH) 35.0 - 45.0 mmHg    Comment: CRITICAL RESULT CALLED TO, READ BACK BY AND VERIFIED WITH: SHEA, RN AT 1805 BY DEE WALTERS RRT ON 01/08/15    pO2, Arterial 123 (H) 80.0 - 100.0 mmHg   Bicarbonate 30.8 (H) 20.0 - 24.0 mEq/L   TCO2 26.4 0 - 100 mmol/L   Acid-Base Excess 2.5 (H) 0.0 - 2.0 mmol/L   O2 Saturation  97.7 %   Patient temperature 37.0    Collection site LEFT RADIAL    Drawn by [phone]    Sample type ARTERIAL DRAW    Allens test (pass/fail) PASS PASS  CBC WITH DIFFERENTIAL     Status: Abnormal   Collection Time: 01/09/15  3:40 AM  Result Value Ref Range   WBC 8.4 4.0 - 10.5 K/uL   RBC 5.89 (H) 4.22 - 5.81 MIL/uL   Hemoglobin 16.9 13.0 - 17.0 g/dL   HCT 52.5 (H) 39.0 - 52.0 %   MCV 89.1 78.0 - 100.0 fL   MCH 28.7 26.0 - 34.0 pg   MCHC 32.2 30.0 - 36.0 g/dL   RDW 14.9 11.5 - 15.5 %   Platelets 222 150 - 400 K/uL   Neutrophils Relative % 79 %   Neutro Abs 6.6 1.7 - 7.7 K/uL   Lymphocytes Relative 14 %   Lymphs Abs 1.2 0.7 - 4.0 K/uL   Monocytes Relative 7 %   Monocytes Absolute 0.6 0.1 - 1.0 K/uL   Eosinophils Relative 0 %   Eosinophils Absolute 0.0 0.0 - 0.7 K/uL   Basophils Relative 0 %   Basophils Absolute 0.0 0.0 - 0.1 K/uL    Radiology/Results: No results found.  Anti-infectives: Anti-infectives    Start      Dose/Rate Route Frequency Ordered Stop   01/08/15 0543  cefOXitin (MEFOXIN) 2 g in dextrose 5 % 50 mL IVPB     2 g 100 mL/hr over 30 Minutes Intravenous On call to O.R. 01/08/15 0543 01/08/15 0926      Assessment/Plan: Problem List: Patient Active Problem List   Diagnosis Date Noted  . S/P laparoscopic sleeve gastrectomy 01/08/2015  . Severe obesity (BMI >= 40) 10/21/2014  . OSA complicated by mild polycythemia  10/21/2014  . Polycythemia, secondary 10/21/2014  . Dyspnea 10/20/2014  . S/P rotator cuff repair 08/18/2013  . KNEE PAIN, LEFT 02/28/2008  . MEDIAL MENISCUS TEAR, LEFT 02/28/2008  . HIP PAIN, RIGHT 10/20/2006  . [place] DISEASE, LUMBOSACRAL SPINE 10/20/2006  . FOOT PAIN, RIGHT 10/20/2006    Advance to PD 1 diet and move upstairs.   1 Day Post-Op    LOS: 1 day   Matt B. Hassell Done, MD, [facility] [phone] beeper [phone]  01/09/2015 11:25 AM

## 2015-01-09 NOTE — Progress Notes (Signed)
*  Preliminary Results* Bilateral lower extremity venous duplex completed. Bilateral lower extremities are negative for deep vein thrombosis. There is no evidence of Baker's cyst bilaterally.  01/09/2015  Gertie Fey, RVT, RDCS, RDMS

## 2015-01-09 NOTE — Progress Notes (Signed)
Pt transferred to 1530 via chair. VSS. Report given and all questions answered.

## 2015-01-10 LAB — CBC WITH DIFFERENTIAL/PLATELET
BASOS PCT: 0 %
Basophils Absolute: 0 10*3/uL (ref 0.0–0.1)
Eosinophils Absolute: 0.1 10*3/uL (ref 0.0–0.7)
Eosinophils Relative: 1 %
HEMATOCRIT: 52 % (ref 39.0–52.0)
Hemoglobin: 16.4 g/dL (ref 13.0–17.0)
LYMPHS ABS: 2.1 10*3/uL (ref 0.7–4.0)
Lymphocytes Relative: 26 %
MCH: 28.7 pg (ref 26.0–34.0)
MCHC: 31.5 g/dL (ref 30.0–36.0)
MCV: 91.1 fL (ref 78.0–100.0)
MONO ABS: 0.5 10*3/uL (ref 0.1–1.0)
MONOS PCT: 7 %
NEUTROS ABS: 5.2 10*3/uL (ref 1.7–7.7)
Neutrophils Relative %: 66 %
Platelets: 205 10*3/uL (ref 150–400)
RBC: 5.71 MIL/uL (ref 4.22–5.81)
RDW: 15.1 % (ref 11.5–15.5)
WBC: 8 10*3/uL (ref 4.0–10.5)

## 2015-01-10 NOTE — Discharge Instructions (Signed)

## 2015-01-10 NOTE — Progress Notes (Signed)
Patient is alert and oreinted, vital signs are stable, incisions are within normal limits, patient up and ambulating and tolerating his bariatric shakes, nothing for pain needed currently, discharge instructions reviewed with patient and spouse, patient is to follow up with MD Daphine Deutscher within a few weeks Stanford Breed RN 1:27 PM 01-10-2015

## 2015-01-10 NOTE — Discharge Summary (Signed)
Physician Discharge Summary  Patient ID: [name] MRN: [id number] DOB/AGE: [birth year] 49 y.o.  Admit date: 01/08/2015 Discharge date: 01/10/2015  Admission Diagnoses:  Morbid obesity and chronic back pain   Discharge Diagnoses:  same  Active Problems:   S/P laparoscopic sleeve gastrectomy Sept 2016   Surgery:  Lap sleeve gastrectomy  Discharged Condition: improved  Hospital Course:   Had surgery.  Kept in stepdown overnight.  Did well with liquids.  Ready for discharge on PD 2  Consults: none  Significant Diagnostic Studies: none    Discharge Exam: Blood pressure 123/59, pulse 62, temperature 98 F (36.7 C), temperature source Axillary, resp. rate 18, height  (1.803 m), weight 184.2 kg (406 lb 1.4 oz), SpO2 100 %. Incisions OK.    Disposition: 01-Home or Self Care  Discharge Instructions    Ambulate hourly while awake    Complete by:  As directed      Call MD for:  difficulty breathing, headache or visual disturbances    Complete by:  As directed      Call MD for:  persistant dizziness or light-headedness    Complete by:  As directed      Call MD for:  persistant nausea and vomiting    Complete by:  As directed      Call MD for:  redness, tenderness, or signs of infection (pain, swelling, redness, odor or green/yellow discharge around incision site)    Complete by:  As directed      Call MD for:  severe uncontrolled pain    Complete by:  As directed      Call MD for:  temperature >101 F    Complete by:  As directed      Diet bariatric full liquid    Complete by:  As directed      Incentive spirometry    Complete by:  As directed   Perform hourly while awake            Medication List    TAKE these medications        ALPRAZolam 0.25 MG tablet  Commonly known as:  XANAX  Take 0.25 mg by mouth 3 (three) times daily as needed for anxiety.     ALPRAZolam 0.5 MG tablet  Commonly known as:  XANAX  Take 0.5 mg by mouth every 8 (eight) hours as  needed for anxiety.     esomeprazole 40 MG capsule  Commonly known as:  NEXIUM  Take 40 mg by mouth 2 (two) times daily.     gabapentin 600 MG tablet  Commonly known as:  NEURONTIN  Take 600 mg by mouth 3 (three) times daily.     LIMBREL 500 MG Caps  Generic drug:  Flavocoxid  Take 500 mg by mouth daily.     methadone 5 MG tablet  Commonly known as:  DOLOPHINE  Take 5 mg by mouth 3 (three) times daily.     morphine 60 MG 24 hr capsule  Commonly known as:  KADIAN  Take 60 mg by mouth every 8 (eight) hours as needed for pain.     oxybutynin 10 MG 24 hr tablet  Commonly known as:  DITROPAN-XL  Take 10 mg by mouth 2 (two) times daily.     polyethylene glycol packet  Commonly known as:  MIRALAX / GLYCOLAX  Take 17 g by mouth 2 (two) times daily.     PRESCRIPTION MEDICATION  morphine ambulatory pump     promethazine 25  MG tablet  Commonly known as:  PHENERGAN  Take 25 mg by mouth 2 (two) times daily as needed for nausea or vomiting.           Follow-up Information    Follow up with Valarie Merino, MD.   Specialty:  General Surgery   Contact information:   [address] [place] [address] [phone]       Signed: Valarie Merino 01/10/2015, 8:22 AM

## 2015-01-15 ENCOUNTER — Telehealth (HOSPITAL_COMMUNITY): Payer: Self-pay

## 2015-01-15 NOTE — Telephone Encounter (Signed)
Made discharge phone call to patient per DROP protocol. Asking the following questions.    1. Do you have someone to care for you now that you are home?  yes 2. Are you having pain now that is not relieved by your pain medication?  no 3. Are you able to drink the recommended daily amount of fluids (48 ounces minimum/day) and protein (60-80 grams/day) as prescribed by the dietitian or nutritional counselor?  yes 4. Are you taking the vitamins and minerals as prescribed?  yes 5. Do you have the "on call" number to contact your surgeon if you have a problem or question?  yes 6. Are your incisions free of redness, swelling or drainage? (If steri strips, address that these can fall off, shower as tolerated) yes 7. Have your bowels moved since your surgery?  If not, are you passing gas?  yes 8. Are you up and walking 3-4 times per day?  yes     

## 2015-01-23 ENCOUNTER — Encounter: Payer: BLUE CROSS/BLUE SHIELD | Attending: Surgery

## 2015-01-23 DIAGNOSIS — Z713 Dietary counseling and surveillance: Secondary | ICD-10-CM | POA: Insufficient documentation

## 2015-01-23 DIAGNOSIS — Z6841 Body Mass Index (BMI) 40.0 and over, adult: Secondary | ICD-10-CM | POA: Insufficient documentation

## 2015-01-23 NOTE — Progress Notes (Signed)
Bariatric Class:  Appt start time: 1530 end time:  1630.  2 Week Post-Operative Nutrition Class  Patient was seen on 01/23/2015 for Post-Operative Nutrition [facility]   Surgery date: 01/08/15 Surgery type: Sleeve Gastrectomy Start weight at [facility]: 425.5 lbs on 06/05/14  Weight today: 393.5 lbs  Weight change: 11.5 lbs  TANITA  BODY COMP RESULTS  01/01/15 01/23/15   BMI (kg/m^2) 58.1 56.5   Fat Mass (lbs) 287.5 281.5   Fat Free Mass (lbs) 117.5 112.0   Total Body Water (lbs) 86 82.0    The following the learning objectives were met by the patient during this course:  Identifies Phase 3A (Soft, High Proteins) Dietary Goals and will begin from 2 weeks post-operatively to 2 months post-operatively  Identifies appropriate sources of fluids and proteins   States protein recommendations and appropriate sources post-operatively  Identifies the need for appropriate texture modifications, mastication, and bite sizes when consuming solids  Identifies appropriate multivitamin and calcium sources post-operatively  Describes the need for physical activity post-operatively and will follow MD recommendations  States when to call healthcare provider regarding medication questions or post-operative complications  Handouts given during class include:  Phase 3A: Soft, High Protein Diet Handout  Follow-Up Plan: Patient will follow-up at [facility] in 6 weeks for 2 month post-op nutrition visit for diet advancement per MD.

## 2015-03-06 ENCOUNTER — Ambulatory Visit: Payer: Self-pay | Admitting: Dietician

## 2015-04-11 ENCOUNTER — Encounter: Payer: Self-pay | Admitting: Dietician

## 2015-04-11 ENCOUNTER — Encounter: Payer: BLUE CROSS/BLUE SHIELD | Attending: Surgery | Admitting: Dietician

## 2015-04-11 DIAGNOSIS — Z713 Dietary counseling and surveillance: Secondary | ICD-10-CM | POA: Insufficient documentation

## 2015-04-11 DIAGNOSIS — Z6841 Body Mass Index (BMI) 40.0 and over, adult: Secondary | ICD-10-CM | POA: Insufficient documentation

## 2015-04-11 NOTE — Progress Notes (Signed)
  Follow-up visit:  12 Weeks Post-Operative sleeve gastrectomy Surgery  Medical Nutrition Therapy:  Appt start time: 1125 end time:  1150.  Primary concerns today: Post-operative Bariatric Surgery Nutrition Management.  [name] returns having lost a total of 66 pounds. He states his urine has been dark and knows he needs to drink more fluid. Has some vomiting if he eats too much. Wife has cancer and he has been very busy and distracted taking care of her. Has tried some cooked carrots and tolerated it.   Surgery date: 01/08/15 Surgery type: Sleeve Gastrectomy [facility] on 06/05/14  Weight today: 359.5 lbs Weight change: 34 lbs Total weight lost: 66 lbs  TANITA  BODY COMP RESULTS  01/01/15 01/23/15 04/11/15   BMI (kg/m^2) 58.1 56.5 51.6   Fat Mass (lbs) 287.5 281.5 206   Fat Free Mass (lbs) 117.5 112.0 153.5   Total Body Water (lbs) 86 82.0 112.5    Preferred Learning Style:   No preference indicated   Learning Readiness:   Ready  24-hr recall:  Has 2 protein shakes per day (Atkins or Premier)  B (AM): 2 Tbsp cottage cheese, sometimes with hummus, and liver pudding (6-12g) Snk (AM): 2 Tbsp cottage cheese (6g) L (PM): 2 Tbsp cottage cheese, sometimes with eggs (6-12g) Snk (PM):   D (PM): 1.5 oz pork or 3 large shrimp (~12g)  Snk (PM): not usually  Fluid intake: caffeinated coffee with Atkins or Premier protein shake (2 shakes daily); 47 oz water with sugar free flavoring (69 oz total) Estimated total protein intake: 75-95 g per day  Medications: no change, see list Supplementation: taking, 1 Calcium per day  Using straws: yes Drinking while eating: no Hair loss: unknown Carbonated beverages: none N/V/D/C: some vomiting if he eats too much; intermittent diarrhea and constipation Dumping syndrome: none  Recent physical activity:  Walking around neighborhood or house (as tolerated about 4 times per day)  Progress Towards Goal(s):  In  progress.  Handouts given during visit include:  Phase 3B lean protein + non starchy vegetables   Nutritional Diagnosis:  [place]-3.3 Overweight/obesity related to past poor dietary habits and physical inactivity as evidenced by patient w/ recent sleeve gastrectomy surgery following dietary guidelines for continued weight loss.     Intervention:  Nutrition counseling provided.  Teaching Method Utilized:  Visual Auditory Hands on  Barriers to learning/adherence to lifestyle change: none  Demonstrated degree of understanding via:  Teach Back   Monitoring/Evaluation:  Dietary intake, exercise, and body weight. Follow up in 2 months for 5 month post-op visit.

## 2015-04-11 NOTE — Patient Instructions (Addendum)
Goals:  Follow Phase 3B: High Protein + Non-Starchy Vegetables  Eat 3-6 small meals/snacks, every 3-5 hrs  Increase lean protein foods to meet 80g goal  Increase fluid intake to 64oz +  Avoid drinking 15 minutes before, during and 30 minutes after eating  Aim for >30 min of physical activity daily  Surgery date: 01/08/15 Surgery type: Sleeve Gastrectomy [facility] on [facility]/14  Weight today: 359.5 lbs Weight change: 34 lbs Total weight lost: 66 lbs  TANITA  BODY COMP RESULTS  01/01/15 01/23/15 04/11/15   BMI (kg/m^2) 58.1 56.5 51.6   Fat Mass (lbs) 287.5 281.5 206   Fat Free Mass (lbs) 117.5 112.0 153.5   Total Body Water (lbs) 86 82.0 112.5

## 2015-05-04 DIAGNOSIS — K219 Gastro-esophageal reflux disease without esophagitis: Secondary | ICD-10-CM | POA: Diagnosis not present

## 2015-05-04 DIAGNOSIS — K5900 Constipation, unspecified: Secondary | ICD-10-CM | POA: Diagnosis not present

## 2015-08-09 DIAGNOSIS — H04123 Dry eye syndrome of bilateral lacrimal glands: Secondary | ICD-10-CM | POA: Diagnosis not present

## 2016-08-14 ENCOUNTER — Encounter (HOSPITAL_COMMUNITY): Payer: Self-pay

## 2017-08-11 DIAGNOSIS — K5900 Constipation, unspecified: Secondary | ICD-10-CM | POA: Diagnosis not present

## 2017-08-11 DIAGNOSIS — M5136 Other intervertebral disc degeneration, lumbar region: Secondary | ICD-10-CM | POA: Diagnosis not present

## 2017-08-11 DIAGNOSIS — Z6841 Body Mass Index (BMI) 40.0 and over, adult: Secondary | ICD-10-CM | POA: Diagnosis not present

## 2017-08-11 DIAGNOSIS — R748 Abnormal levels of other serum enzymes: Secondary | ICD-10-CM | POA: Diagnosis not present

## 2017-08-11 DIAGNOSIS — R32 Unspecified urinary incontinence: Secondary | ICD-10-CM | POA: Diagnosis not present

## 2017-08-11 DIAGNOSIS — Z9884 Bariatric surgery status: Secondary | ICD-10-CM | POA: Diagnosis not present

## 2017-08-11 DIAGNOSIS — G4730 Sleep apnea, unspecified: Secondary | ICD-10-CM | POA: Diagnosis not present

## 2017-08-11 DIAGNOSIS — K219 Gastro-esophageal reflux disease without esophagitis: Secondary | ICD-10-CM | POA: Diagnosis not present

## 2017-08-12 ENCOUNTER — Encounter (HOSPITAL_COMMUNITY): Payer: Self-pay

## 2017-08-21 DIAGNOSIS — R7309 Other abnormal glucose: Secondary | ICD-10-CM | POA: Diagnosis not present

## 2017-11-08 NOTE — Progress Notes (Signed)
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 VD125OH2TOT, [id number], [id number], 25OHVITD1, 25OHVITD2, 25OHVITD3, TESTOFREE, TESTOSTERONE                       Coagulation Parameters Lab Results  Component Value Date   INR 0.96 08/13/2010   LABPROT 13.0 08/13/2010   APTT 32 08/13/2010   PLT 205 01/10/2015   DDIMER 0.33 10/20/2014                        Cardiovascular Markers Lab Results  Component Value Date   HGB 16.4 01/10/2015   HCT 52.0 01/10/2015                         CA Markers No results found for: CEA, CA125, LABCA2  Note: Lab results reviewed.  PFSH  Drug: Mr. Wilbourne  reports that he does not use drugs. Alcohol:  reports that he does not drink alcohol. Tobacco:  reports that he has never smoked. He has quit using smokeless tobacco. Medical:  has a past medical history of Arthritis, Asthma, Back pain, GERD (gastroesophageal reflux disease), Hernia, History of hiatal hernia, Hypogonadism in male, Neuromuscular disorder ([place]), PONV (postoperative nausea and vomiting), Shortness of breath  dyspnea, Sleep apnea, Ulcer, and Wears glasses. Family: family history includes Diabetes in his mother; Pneumonia in his father.  Past Surgical History:  Procedure Laterality Date  . BACK SURGERY    . JOINT REPLACEMENT     Left knee replacement  . LAPAROSCOPIC GASTRIC SLEEVE RESECTION N/A 01/08/2015   Procedure: LAPAROSCOPIC GASTRIC SLEEVE RESECTION;  Surgeon: Johnathan Hausen, MD;  Location: [facility];  Service: General;  Laterality: N/A;  . morphine pump    . SHOULDER ARTHROSCOPY WITH SUBACROMIAL DECOMPRESSION AND BICEP TENDON REPAIR Left 10/06/2013   Procedure: LEFT SHOULDER ARTHROSCOPY DEBRIDEMENT EXTENTSIVE,DISTAL CLAVICULECTOMY,DECOMPRESSION SUBACROMIAL PARTIAL ACROMIOPLASTY WITH ROTATOR CUFF REPAIR, and excision of CALCIUM DEPOSIT;  Surgeon: Ninetta Lights, MD;  Location: [place];  Service: Orthopedics;  Laterality: Left;  . SHOULDER ARTHROSCOPY WITH SUBACROMIAL DECOMPRESSION, ROTATOR CUFF REPAIR AND BICEP TENDON REPAIR Right 08/18/2013   Procedure: RIGHT SHOULDER ARTHROSCOPY WITH SUBACROMIAL DECOMPRESSION/PARTIAL ACROMIOPLASTY WITH CORACOACROMIAL RELEASE/DISTAL CLAVICULECTOMY/ ROTATOR CUFF REPAIR/DEBRIDEMENT EXTENTSIVE;  Surgeon: Ninetta Lights, MD;  Location: [place];  Service: Orthopedics;  Laterality: Right;  ANESTHESIA: GENERAL, PRE/POST OP SCALENE  . TONSILLECTOMY     Active Ambulatory Problems    Diagnosis Date Noted  . Chronic knee pain (Left) 02/28/2008  . DDD (degenerative disc disease), lumbosacral 10/20/2006  . S/P rotator cuff repair (Bilateral) 08/18/2013  . Dyspnea 10/20/2014  . Severe obesity (BMI >= 40) ([place]) 10/21/2014  . OSA complicated by mild polycythemia  10/21/2014  . Polycythemia, secondary 10/21/2014  . S/P laparoscopic sleeve gastrectomy Sept 2016 01/08/2015  . Chronic pain syndrome 11/09/2017  . Long term prescription benzodiazepine use 11/09/2017  . Long term prescription opiate use 11/09/2017  . Opiate use (> 150 MME/day)  11/09/2017  . Long-term use of high-risk medication 11/09/2017  . Pharmacologic therapy 11/09/2017  . Disorder of skeletal system 11/09/2017  . Problems influencing health status 11/09/2017  . Chronic nausea 11/09/2017  . Presence of intrathecal pump 11/09/2017  . Encounter for interrogation of infusion pump 11/09/2017  . Chronic foot pain (Right) 11/09/2017  . Chronic hip pain (Right) 11/09/2017  . Old tear of medial meniscus of knee (Left) 11/09/2017  . Failed back surgical syndrome 11/09/2017  . Chronic low back pain (Bilateral) w/ sciatica (Bilateral) 11/09/2017  . Chronic lower extremity pain (Bilateral) (L>R) 11/09/2017  . Chronic lower extremity radicular pain 11/09/2017  . Chronic shoulder pain (Bilateral) 11/09/2017  . Hx of total knee replacement (Left) 11/09/2017  . History of arthroscopic surgery of shoulder (Bilateral) 11/09/2017   Resolved Ambulatory Problems    Diagnosis Date Noted  . No Resolved Ambulatory Problems   Past Medical History:  Diagnosis Date  . Arthritis   . Asthma   . Back pain   . GERD (gastroesophageal reflux disease)   . Hernia   . History of hiatal hernia   . Hypogonadism in male   . Neuromuscular disorder ([place])   . PONV (postoperative nausea and vomiting)   . Shortness of breath dyspnea   . Sleep apnea   . Ulcer   .  Wears glasses    Constitutional Exam  General appearance: Well nourished, well developed, and well hydrated. In no apparent acute distress Vitals:   11/09/17 1039  BP: (!) 147/83  Pulse: 92  Resp: 16  Temp: 98.2 F (36.8 C)  TempSrc: Oral  SpO2: 99%  Weight: (!) 340 lb (154.2 kg)  Height: 5' 10.5" (1.791 m)   BMI Assessment: Estimated body mass index is 48.1 kg/m as calculated from the following:   Height as of this encounter: 5' 10.5" (1.791 m).   Weight as of this encounter: 340 lb (154.2 kg).  BMI interpretation table: BMI level Category Range association with higher incidence of chronic pain  <18 kg/m2  Underweight   18.5-24.9 kg/m2 Ideal body weight   25-29.9 kg/m2 Overweight Increased incidence by 20%  30-34.9 kg/m2 Obese (Class I) Increased incidence by 68%  35-39.9 kg/m2 Severe obesity (Class II) Increased incidence by 136%  >40 kg/m2 Extreme obesity (Class III) Increased incidence by 254%   Patient's current BMI Ideal Body weight  Body mass index is 48.1 kg/m. Ideal body weight: 74.1 kg (163 lb 7.5 oz) Adjusted ideal body weight: 106.2 kg (234 lb 1.3 oz)   BMI Readings from Last 4 Encounters:  11/09/17 48.10 kg/m  04/11/15 51.58 kg/m  01/23/15 56.46 kg/m  01/08/15 56.64 kg/m   Wt Readings from Last 4 Encounters:  11/09/17 (!) 340 lb (154.2 kg)  04/11/15 (!) 359 lb 8 oz (163.1 kg)  01/23/15 (!) 393 lb 8 oz (178.5 kg)  01/08/15 (!) 406 lb 1.4 oz (184.2 kg)  Psych/Mental status: Alert, oriented x 3 (person, place, & time)       Eyes: PERLA Respiratory: No evidence of acute respiratory distress  Cervical Spine Area Exam  Skin & Axial Inspection: No masses, redness, edema, swelling, or associated skin lesions Alignment: Symmetrical Functional ROM: Unrestricted ROM      Stability: No instability detected Muscle Tone/Strength: Functionally intact. No obvious neuro-muscular anomalies detected. Sensory (Neurological): Unimpaired Palpation: No palpable anomalies              Upper Extremity (UE) Exam    Side: Right upper extremity  Side: Left upper extremity  Skin & Extremity Inspection: Skin color, temperature, and hair growth are WNL. No peripheral edema or cyanosis. No masses, redness, swelling, asymmetry, or associated skin lesions. No contractures.  Skin & Extremity Inspection: Skin color, temperature, and hair growth are WNL. No peripheral edema or cyanosis. No masses, redness, swelling, asymmetry, or associated skin lesions. No contractures.  Functional ROM: Unrestricted ROM          Functional ROM: Unrestricted ROM          Muscle Tone/Strength: Functionally intact. No  obvious neuro-muscular anomalies detected.  Muscle Tone/Strength: Functionally intact. No obvious neuro-muscular anomalies detected.  Sensory (Neurological): Unimpaired          Sensory (Neurological): Unimpaired          Palpation: No palpable anomalies              Palpation: No palpable anomalies              Provocative Test(s):  Phalen's test: deferred Tinel's test: deferred Apley's scratch test (touch opposite shoulder):  Action 1 (Across chest): deferred Action 2 (Overhead): deferred Action 3 (LB reach): deferred   Provocative Test(s):  Phalen's test: deferred Tinel's test: deferred Apley's scratch test (touch opposite shoulder):  Action 1 (Across chest): deferred Action 2 (Overhead): deferred Action 3 (LB reach): deferred  Thoracic Spine Area Exam  Skin & Axial Inspection: No masses, redness, or swelling Alignment: Symmetrical Functional ROM: Unrestricted ROM Stability: No instability detected Muscle Tone/Strength: Functionally intact. No obvious neuro-muscular anomalies detected. Sensory (Neurological): Unimpaired Muscle strength & Tone: No palpable anomalies  Lumbar Spine Area Exam  Skin & Axial Inspection: No masses, redness, or swelling Alignment: Symmetrical Functional ROM: Decreased ROM       Stability: No instability detected Muscle Tone/Strength: Functionally intact. No obvious neuro-muscular anomalies detected. Sensory (Neurological): Movement-associated pain Palpation: Complains of area being tender to palpation       Provocative Tests: Lumbar Hyperextension/rotation test: deferred today       Lumbar quadrant test (Kemp's test): deferred today       Lumbar Lateral bending test: deferred today       Patrick's Maneuver: deferred today                   FABER test: deferred today                   Thigh-thrust test: deferred today       S-I compression test: deferred today       S-I distraction test: deferred today        Gait & Posture Assessment   Ambulation: Patient ambulates using a walker Gait: Modified gait pattern (slower gait speed, wider stride width, and longer stance duration) associated with morbid obesity Posture: Difficulty standing up straight, due to pain   Lower Extremity Exam    Side: Right lower extremity  Side: Left lower extremity  Stability: No instability observed          Stability: No instability observed          Skin & Extremity Inspection: Skin color, temperature, and hair growth are WNL. No peripheral edema or cyanosis. No masses, redness, swelling, asymmetry, or associated skin lesions. No contractures.  Skin & Extremity Inspection: Skin color, temperature, and hair growth are WNL. No peripheral edema or cyanosis. No masses, redness, swelling, asymmetry, or associated skin lesions. No contractures.  Functional ROM: Unrestricted ROM                  Functional ROM: Unrestricted ROM                  Muscle Tone/Strength: Functionally intact. No obvious neuro-muscular anomalies detected.  Muscle Tone/Strength: Functionally intact. No obvious neuro-muscular anomalies detected.  Sensory (Neurological): Unimpaired  Sensory (Neurological): Unimpaired  Palpation: No palpable anomalies  Palpation: No palpable anomalies   Assessment  Primary Diagnosis & Pertinent Problem List: The primary encounter diagnosis was Chronic pain syndrome. Diagnoses of Failed back surgical syndrome, Chronic low back pain (Bilateral) w/ sciatica (Bilateral), DDD (degenerative disc disease), lumbosacral, Chronic lower extremity pain (Bilateral) (L>R), Chronic lower extremity radicular pain, Chronic hip pain (Right), Chronic knee pain (Left), Other old tear of medial meniscus of left knee, Chronic foot pain (Right), Pharmacologic therapy, Long-term use of high-risk medication, Long term prescription benzodiazepine use, Long term prescription opiate use, Opiate use (> 150 MME/day), Disorder of skeletal system, Problems influencing health status,  Chronic nausea, Presence of intrathecal pump, Encounter for interrogation of infusion pump, Chronic shoulder pain (Bilateral), Hx of total knee replacement (Left), and History of arthroscopic surgery of shoulder (Bilateral) were also pertinent to this visit.  Visit Diagnosis (New problems to examiner): 1. Chronic pain syndrome   2. Failed back surgical syndrome   3. Chronic low back pain (Bilateral) w/ sciatica (Bilateral)  4. DDD (degenerative disc disease), lumbosacral   5. Chronic lower extremity pain (Bilateral) (L>R)   6. Chronic lower extremity radicular pain   7. Chronic hip pain (Right)   8. Chronic knee pain (Left)   9. Other old tear of medial meniscus of left knee   10. Chronic foot pain (Right)   11. Pharmacologic therapy   12. Long-term use of high-risk medication   13. Long term prescription benzodiazepine use   14. Long term prescription opiate use   15. Opiate use (> 150 MME/day)   16. Disorder of skeletal system   17. Problems influencing health status   18. Chronic nausea   19. Presence of intrathecal pump   20. Encounter for interrogation of infusion pump   21. Chronic shoulder pain (Bilateral)   22. Hx of total knee replacement (Left)   23. History of arthroscopic surgery of shoulder (Bilateral)    Plan of Care (Initial workup plan)  Note: Mr. Sear was reminded that as per protocol, today's visit has been an evaluation only. We have not taken over the patient's controlled substance management.  Problem-specific plan: No problem-specific Assessment & Plan notes found for this encounter.  Lab Orders     Compliance Drug Analysis, Ur     Comp. Metabolic Panel (12)     Magnesium     Vitamin B12     Sedimentation rate     25-Hydroxyvitamin D Lcms D2+D3     C-reactive protein     Testosterone, Free, Total, SHBG  Imaging Orders     DG Lumbar Spine Complete W/Bend     DG Thoracic Spine 2 View     DG HIP UNILAT W OR W/O PELVIS 2-3 VIEWS RIGHT     DG Knee  1-2 Views Left     DG Foot Complete Right Referral Orders  No referral(s) requested today   Procedure Orders    No procedure(s) ordered today   Pharmacotherapy (current): Medications ordered:  Meds ordered this encounter  Medications  . PAIN MANAGEMENT IT PUMP REFILL    Sig: 1 each by Intrathecal route once for 1 dose. Medication: PF Morphine 25.0 mg/ml   Total Volume: 40 ml Needed by 11-17-17 @ 1000    Dispense:  1 each    Refill:  0   Medications administered during this visit: Jeneen Rinks B. Beichner had no medications administered during this visit.   Pharmacological management options:  Opioid Analgesics: The patient was informed that there is no guarantee that he would be a candidate for opioid analgesics. The decision will be made following CDC guidelines. This decision will be based on the results of diagnostic studies, as well as Mr. Morado's risk profile.   Membrane stabilizer: To be determined at a later time  Muscle relaxant: To be determined at a later time  NSAID: To be determined at a later time  Other analgesic(s): To be determined at a later time   Interventional management options: Mr. Frappier was informed that there is no guarantee that he would be a candidate for interventional therapies. The decision will be based on the results of diagnostic studies, as well as Mr. Citron's risk profile.  Procedure(s) under consideration:  Intrathecal pump management with refills as per pump programming.  Downward tapering of opioids for the purpose of accomplishing a "Drug Holiday".    Provider-requested follow-up: Return for 2nd Visit (Post-tests).  Future Appointments  Date Time Provider [place]  11/17/2017 11:15 AM Milinda Pointer, MD [facility] None  Primary Care Physician: Shirline Frees, MD Location: [facility] Note by: Gaspar Cola, MD Date: 11/09/2017; Time: 12:31 PM

## 2017-11-09 ENCOUNTER — Ambulatory Visit
Payer: No Typology Code available for payment source | Attending: Student in an Organized Health Care Education/Training Program | Admitting: Pain Medicine

## 2017-11-09 ENCOUNTER — Other Ambulatory Visit: Payer: Self-pay

## 2017-11-09 ENCOUNTER — Encounter: Payer: Self-pay | Admitting: Pain Medicine

## 2017-11-09 VITALS — BP 147/83 | HR 92 | Temp 98.2°F | Resp 16 | Ht 70.5 in | Wt 340.0 lb

## 2017-11-09 DIAGNOSIS — M899 Disorder of bone, unspecified: Secondary | ICD-10-CM | POA: Insufficient documentation

## 2017-11-09 DIAGNOSIS — M5410 Radiculopathy, site unspecified: Secondary | ICD-10-CM

## 2017-11-09 DIAGNOSIS — M961 Postlaminectomy syndrome, not elsewhere classified: Secondary | ICD-10-CM | POA: Diagnosis not present

## 2017-11-09 DIAGNOSIS — E291 Testicular hypofunction: Secondary | ICD-10-CM | POA: Diagnosis not present

## 2017-11-09 DIAGNOSIS — D751 Secondary polycythemia: Secondary | ICD-10-CM | POA: Insufficient documentation

## 2017-11-09 DIAGNOSIS — M5442 Lumbago with sciatica, left side: Secondary | ICD-10-CM | POA: Diagnosis not present

## 2017-11-09 DIAGNOSIS — Z978 Presence of other specified devices: Secondary | ICD-10-CM | POA: Insufficient documentation

## 2017-11-09 DIAGNOSIS — Z9884 Bariatric surgery status: Secondary | ICD-10-CM | POA: Insufficient documentation

## 2017-11-09 DIAGNOSIS — M25511 Pain in right shoulder: Secondary | ICD-10-CM | POA: Insufficient documentation

## 2017-11-09 DIAGNOSIS — Z789 Other specified health status: Secondary | ICD-10-CM

## 2017-11-09 DIAGNOSIS — M79605 Pain in left leg: Secondary | ICD-10-CM | POA: Insufficient documentation

## 2017-11-09 DIAGNOSIS — Z79891 Long term (current) use of opiate analgesic: Secondary | ICD-10-CM | POA: Diagnosis not present

## 2017-11-09 DIAGNOSIS — M5137 Other intervertebral disc degeneration, lumbosacral region: Secondary | ICD-10-CM | POA: Diagnosis not present

## 2017-11-09 DIAGNOSIS — M25551 Pain in right hip: Secondary | ICD-10-CM | POA: Diagnosis not present

## 2017-11-09 DIAGNOSIS — Z9689 Presence of other specified functional implants: Secondary | ICD-10-CM

## 2017-11-09 DIAGNOSIS — G4733 Obstructive sleep apnea (adult) (pediatric): Secondary | ICD-10-CM | POA: Insufficient documentation

## 2017-11-09 DIAGNOSIS — K219 Gastro-esophageal reflux disease without esophagitis: Secondary | ICD-10-CM | POA: Insufficient documentation

## 2017-11-09 DIAGNOSIS — Z833 Family history of diabetes mellitus: Secondary | ICD-10-CM | POA: Insufficient documentation

## 2017-11-09 DIAGNOSIS — M5441 Lumbago with sciatica, right side: Secondary | ICD-10-CM

## 2017-11-09 DIAGNOSIS — Z79899 Other long term (current) drug therapy: Secondary | ICD-10-CM

## 2017-11-09 DIAGNOSIS — Z6841 Body Mass Index (BMI) 40.0 and over, adult: Secondary | ICD-10-CM | POA: Diagnosis not present

## 2017-11-09 DIAGNOSIS — Z451 Encounter for adjustment and management of infusion pump: Secondary | ICD-10-CM

## 2017-11-09 DIAGNOSIS — R110 Nausea: Secondary | ICD-10-CM | POA: Diagnosis not present

## 2017-11-09 DIAGNOSIS — M79604 Pain in right leg: Secondary | ICD-10-CM | POA: Diagnosis not present

## 2017-11-09 DIAGNOSIS — G894 Chronic pain syndrome: Secondary | ICD-10-CM | POA: Diagnosis not present

## 2017-11-09 DIAGNOSIS — M23204 Derangement of unspecified medial meniscus due to old tear or injury, left knee: Secondary | ICD-10-CM

## 2017-11-09 DIAGNOSIS — M51379 Other intervertebral disc degeneration, lumbosacral region without mention of lumbar back pain or lower extremity pain: Secondary | ICD-10-CM

## 2017-11-09 DIAGNOSIS — M545 Low back pain: Secondary | ICD-10-CM | POA: Diagnosis present

## 2017-11-09 DIAGNOSIS — M79671 Pain in right foot: Secondary | ICD-10-CM | POA: Insufficient documentation

## 2017-11-09 DIAGNOSIS — M5117 Intervertebral disc disorders with radiculopathy, lumbosacral region: Secondary | ICD-10-CM | POA: Insufficient documentation

## 2017-11-09 DIAGNOSIS — M25512 Pain in left shoulder: Secondary | ICD-10-CM | POA: Diagnosis not present

## 2017-11-09 DIAGNOSIS — M25562 Pain in left knee: Secondary | ICD-10-CM

## 2017-11-09 DIAGNOSIS — Z96652 Presence of left artificial knee joint: Secondary | ICD-10-CM | POA: Diagnosis not present

## 2017-11-09 DIAGNOSIS — G8929 Other chronic pain: Secondary | ICD-10-CM

## 2017-11-09 DIAGNOSIS — Z9889 Other specified postprocedural states: Secondary | ICD-10-CM

## 2017-11-09 DIAGNOSIS — F1190 Opioid use, unspecified, uncomplicated: Secondary | ICD-10-CM

## 2017-11-09 MED ORDER — PAIN MANAGEMENT IT PUMP REFILL: 1.0000 | Freq: Once | INTRATHECAL | 0 refills | Status: AC

## 2017-11-09 NOTE — Patient Instructions (Addendum)
____________________________________________________________________________________________  Pain Scale  Introduction: The pain score used by this practice is the Verbal Numerical Rating Scale (VNRS-11). This is an 11-point scale. It is for adults and children 10 years or older. There are significant differences in how the pain score is reported, used, and applied. Forget everything you learned in the past and learn this scoring system.  General Information: The scale should reflect your current level of pain. Unless you are specifically asked for the level of your worst pain, or your average pain. If you are asked for one of these two, then it should be understood that it is over the past 24 hours.  Basic Activities of Daily Living (ADL): Personal hygiene, dressing, eating, transferring, and using restroom.  Instructions: Most patients tend to report their level of pain as a combination of two factors, their physical pain and their psychosocial pain. This last one is also known as "suffering" and it is reflection of how physical pain affects you socially and psychologically. From now on, report them separately. From this point on, when asked to report your pain level, report only your physical pain. Use the following table for reference.  Pain Clinic Pain Levels (0-5/10)  Pain Level Score  Description  No Pain 0   Mild pain 1 Nagging, annoying, but does not interfere with basic activities of daily living (ADL). Patients are able to eat, bathe, get dressed, toileting (being able to get on and off the toilet and perform personal hygiene functions), transfer (move in and out of bed or a chair without assistance), and maintain continence (able to control bladder and bowel functions). Blood pressure and heart rate are unaffected. A normal heart rate for a healthy adult ranges from 60 to 100 bpm (beats per minute).   Mild to moderate pain 2 Noticeable and distracting. Impossible to hide from other  people. More frequent flare-ups. Still possible to adapt and function close to normal. It can be very annoying and may have occasional stronger flare-ups. With discipline, patients may get used to it and adapt.   Moderate pain 3 Interferes significantly with activities of daily living (ADL). It becomes difficult to feed, bathe, get dressed, get on and off the toilet or to perform personal hygiene functions. Difficult to get in and out of bed or a chair without assistance. Very distracting. With effort, it can be ignored when deeply involved in activities.   Moderately severe pain 4 Impossible to ignore for more than a few minutes. With effort, patients may still be able to manage work or participate in some social activities. Very difficult to concentrate. Signs of autonomic nervous system discharge are evident: dilated pupils (mydriasis); mild sweating (diaphoresis); sleep interference. Heart rate becomes elevated (>115 bpm). Diastolic blood pressure (lower number) rises above 100 mmHg. Patients find relief in laying down and not moving.   Severe pain 5 Intense and extremely unpleasant. Associated with frowning face and frequent crying. Pain overwhelms the senses.  Ability to do any activity or maintain social relationships becomes significantly limited. Conversation becomes difficult. Pacing back and forth is common, as getting into a comfortable position is nearly impossible. Pain wakes you up from deep sleep. Physical signs will be obvious: pupillary dilation; increased sweating; goosebumps; brisk reflexes; cold, clammy hands and feet; nausea, vomiting or dry heaves; loss of appetite; significant sleep disturbance with inability to fall asleep or to remain asleep. When persistent, significant weight loss is observed due to the complete loss of appetite and sleep deprivation.  Blood   pressure and heart rate becomes significantly elevated. Caution: If elevated blood pressure triggers a pounding headache  associated with blurred vision, then the patient should immediately seek attention at an urgent or emergency care unit, as these may be signs of an impending stroke.    Emergency Department Pain Levels (6-10/10)  Emergency Room Pain 6 Severely limiting. Requires emergency care and should not be seen or managed at an outpatient pain management facility. Communication becomes difficult and requires great effort. Assistance to reach the emergency department may be required. Facial flushing and profuse sweating along with potentially dangerous increases in heart rate and blood pressure will be evident.   Distressing pain 7 Self-care is very difficult. Assistance is required to transport, or use restroom. Assistance to reach the emergency department will be required. Tasks requiring coordination, such as bathing and getting dressed become very difficult.   Disabling pain 8 Self-care is no longer possible. At this level, pain is disabling. The individual is unable to do even the most "basic" activities such as walking, eating, bathing, dressing, transferring to a bed, or toileting. Fine motor skills are lost. It is difficult to think clearly.   Incapacitating pain 9 Pain becomes incapacitating. Thought processing is no longer possible. Difficult to remember your own name. Control of movement and coordination are lost.   The worst pain imaginable 10 At this level, most patients pass out from pain. When this level is reached, collapse of the autonomic nervous system occurs, leading to a sudden drop in blood pressure and heart rate. This in turn results in a temporary and dramatic drop in blood flow to the brain, leading to a loss of consciousness. Fainting is one of the body's self defense mechanisms. Passing out puts the brain in a calmed state and causes it to shut down for a while, in order to begin the healing process.    Summary: 1. Refer to this scale when providing us with your pain level. 2. Be  accurate and careful when reporting your pain level. This will help with your care. 3. Over-reporting your pain level will lead to loss of credibility. 4. Even a level of 1/10 means that there is pain and will be treated at our facility. 5. High, inaccurate reporting will be documented as "Symptom Exaggeration", leading to loss of credibility and suspicions of possible secondary gains such as obtaining more narcotics, or wanting to appear disabled, for fraudulent reasons. 6. Only pain levels of 5 or below will be seen at our facility. 7. Pain levels of 6 and above will be sent to the Emergency Department and the appointment cancelled. ____________________________________________________________________________________________   ____________________________________________________________________________________________  Medication Rules  Applies to: All patients receiving prescriptions (written or electronic).  Pharmacy of record: Pharmacy where electronic prescriptions will be sent. If written prescriptions are taken to a different pharmacy, please inform the nursing staff. The pharmacy listed in the electronic medical record should be the one where you would like electronic prescriptions to be sent.  Prescription refills: Only during scheduled appointments. Applies to both, written and electronic prescriptions.  NOTE: The following applies primarily to controlled substances (Opioid* Pain Medications).   Patient's responsibilities: 1. Pain Pills: Bring all pain pills to every appointment (except for procedure appointments). 2. Pill Bottles: Bring pills in original pharmacy bottle. Always bring newest bottle. Bring bottle, even if empty. 3. Medication refills: You are responsible for knowing and keeping track of what medications you need refilled. The day before your appointment, write a list of all   prescriptions that need to be refilled. Bring that list to your appointment and give it to the  admitting nurse. Prescriptions will be written only during appointments. If you forget a medication, it will not be "Called in", "Faxed", or "electronically sent". You will need to get another appointment to get these prescribed. 4. Prescription Accuracy: You are responsible for carefully inspecting your prescriptions before leaving our office. Have the discharge nurse carefully go over each prescription with you, before taking them home. Make sure that your name is accurately spelled, that your address is correct. Check the name and dose of your medication to make sure it is accurate. Check the number of pills, and the written instructions to make sure they are clear and accurate. Make sure that you are given enough medication to last until your next medication refill appointment. 5. Taking Medication: Take medication as prescribed. Never take more pills than instructed. Never take medication more frequently than prescribed. Taking less pills or less frequently is permitted and encouraged, when it comes to controlled substances (written prescriptions).  6. Inform other Doctors: Always inform, all of your healthcare providers, of all the medications you take. 7. Pain Medication from other Providers: You are not allowed to accept any additional pain medication from any other Doctor or Healthcare provider. There are two exceptions to this rule. (see below) In the event that you require additional pain medication, you are responsible for notifying us, as stated below. 8. Medication Agreement: You are responsible for carefully reading and following our Medication Agreement. This must be signed before receiving any prescriptions from our practice. Safely store a copy of your signed Agreement. Violations to the Agreement will result in no further prescriptions. (Additional copies of our Medication Agreement are available upon request.) 9. Laws, Rules, & Regulations: All patients are expected to follow all Federal  and State Laws, Statutes, Rules, & Regulations. Ignorance of the Laws does not constitute a valid excuse. The use of any illegal substances is prohibited. 10. Adopted CDC guidelines & recommendations: Target dosing levels will be at or below 60 MME/day. Use of benzodiazepines** is not recommended.  Exceptions: There are only two exceptions to the rule of not receiving pain medications from other Healthcare Providers. 1. Exception #1 (Emergencies): In the event of an emergency (i.e.: accident requiring emergency care), you are allowed to receive additional pain medication. However, you are responsible for: As soon as you are able, call our office [phone], at any time of the day or night, and leave a message stating your name, the date and nature of the emergency, and the name and dose of the medication prescribed. In the event that your call is answered by a member of our staff, make sure to document and save the date, time, and the name of the person that took your information.  2. Exception #2 (Planned Surgery): In the event that you are scheduled by another doctor or dentist to have any type of surgery or procedure, you are allowed (for a period no longer than 30 days), to receive additional pain medication, for the acute post-op pain. However, in this case, you are responsible for picking up a copy of our "Post-op Pain Management for Surgeons" handout, and giving it to your surgeon or dentist. This document is available at our office, and does not require an appointment to obtain it. Simply go to our office during business hours (Monday-Thursday from 8:00 AM to 4:00 PM) (Friday 8:00 AM to 12:00 Noon) or if you   have a scheduled appointment with us, prior to your surgery, and ask for it by name. In addition, you will need to provide us with your name, name of your surgeon, type of surgery, and date of procedure or surgery.  *Opioid medications include: morphine, codeine, oxycodone, oxymorphone,  hydrocodone, hydromorphone, meperidine, tramadol, tapentadol, buprenorphine, fentanyl, methadone. **Benzodiazepine medications include: diazepam (Valium), alprazolam (Xanax), clonazepam (Klonopine), lorazepam (Ativan), clorazepate (Tranxene), chlordiazepoxide (Librium), estazolam (Prosom), oxazepam (Serax), temazepam (Restoril), triazolam (Halcion) (Last updated: 06/11/2017) ____________________________________________________________________________________________   ____________________________________________________________________________________________  Medication Recommendations and Reminders  Applies to: All patients receiving prescriptions (written and/or electronic).  Medication Rules & Regulations: These rules and regulations exist for your safety and that of others. They are not flexible and neither are we. Dismissing or ignoring them will be considered "non-compliance" with medication therapy, resulting in complete and irreversible termination of such therapy. (See document titled "Medication Rules" for more details.) In all conscience, because of safety reasons, we cannot continue providing a therapy where the patient does not follow instructions.  Pharmacy of record:   Definition: This is the pharmacy where your electronic prescriptions will be sent.   We do not endorse any particular pharmacy.  You are not restricted in your choice of pharmacy.  The pharmacy listed in the electronic medical record should be the one where you want electronic prescriptions to be sent.  If you choose to change pharmacy, simply notify our nursing staff of your choice of new pharmacy.  Recommendations:  Keep all of your pain medications in a safe place, under lock and key, even if you live alone.   After you fill your prescription, take 1 week's worth of pills and put them away in a safe place. You should keep a separate, properly labeled bottle for this purpose. The remainder should be kept  in the original bottle. Use this as your primary supply, until it runs out. Once it's gone, then you know that you have 1 week's worth of medicine, and it is time to come in for a prescription refill. If you do this correctly, it is unlikely that you will ever run out of medicine.  To make sure that the above recommendation works, it is very important that you make sure your medication refill appointments are scheduled at least 1 week before you run out of medicine. To do this in an effective manner, make sure that you do not leave the office without scheduling your next medication management appointment. Always ask the nursing staff to show you in your prescription , when your medication will be running out. Then arrange for the receptionist to get you a return appointment, at least 7 days before you run out of medicine. Do not wait until you have 1 or 2 pills left, to come in. This is very poor planning and does not take into consideration that we may need to cancel appointments due to bad weather, sickness, or emergencies affecting our staff.  "Partial Fill": If for any reason your pharmacy does not have enough pills/tablets to completely fill or refill your prescription, do not allow for a "partial fill". You will need a separate prescription to fill the remaining amount, which we will not provide. If the reason for the partial fill is your insurance, you will need to talk to the pharmacist about payment alternatives for the remaining tablets, but again, do not accept a partial fill.  Prescription refills and/or changes in medication(s):   Prescription refills, and/or changes in dose or medication, will be   conducted only during scheduled medication management appointments. (Applies to both, written and electronic prescriptions.)  No refills on procedure days. No medication will be changed or started on procedure days. No changes, adjustments, and/or refills will be conducted on a procedure day. Doing  so will interfere with the diagnostic portion of the procedure.  No phone refills. No medications will be "called into the pharmacy".  No Fax refills.  No weekend refills.  No Holliday refills.  No after hours refills.  Remember:  Business hours are:  Monday to Thursday 8:00 AM to 4:00 PM Provider's Schedule: Crystal King, NP - Appointments are:  Medication management: Monday to Thursday 8:00 AM to 4:00 PM Juhi Lagrange, MD - Appointments are:  Medication management: Monday and Wednesday 8:00 AM to 4:00 PM Procedure day: Tuesday and Thursday 7:30 AM to 4:00 PM Bilal Lateef, MD - Appointments are:  Medication management: Tuesday and Thursday 8:00 AM to 4:00 PM Procedure day: Monday and Wednesday 7:30 AM to 4:00 PM (Last update: 06/11/2017) ____________________________________________________________________________________________   ____________________________________________________________________________________________  CANNABIDIOL (AKA: CBD Oil or Pills)  Applies to: All patients receiving prescriptions of controlled substances (written and/or electronic).  General Information: Cannabidiol (CBD) was discovered in 1940. It is one of some 113 identified cannabinoids in cannabis (Marijuana) plants, accounting for up to 40% of the plant's extract. As of 2018, preliminary clinical research on cannabidiol included studies of anxiety, cognition, movement disorders, and pain.  Cannabidiol is consummed in multiple ways, including inhalation of cannabis smoke or vapor, as an aerosol spray into the cheek, and by mouth. It may be supplied as CBD oil containing CBD as the active ingredient (no added tetrahydrocannabinol (THC) or terpenes), a full-plant CBD-dominant hemp extract oil, capsules, dried cannabis, or as a liquid solution. CBD is thought not have the same psychoactivity as THC, and may affect the actions of THC. Studies suggest that CBD may interact with different biological  targets, including cannabinoid receptors and other neurotransmitter receptors. As of 2018 the mechanism of action for its biological effects has not been determined.  In the [place], cannabidiol has a limited approval by the Food and Drug Administration (FDA) for treatment of only two types of epilepsy disorders. The side effects of long-term use of the drug include somnolence, decreased appetite, diarrhea, fatigue, malaise, weakness, sleeping problems, and others.  CBD remains a Schedule I drug prohibited for any use.  Legality: Some manufacturers ship CBD products nationally, an illegal action which the FDA has not enforced in 2018, with CBD remaining the subject of an FDA investigational new drug evaluation, and is not considered legal as a dietary supplement or food ingredient as of December 2018. Federal illegality has made it difficult historically to conduct research on CBD. CBD is openly sold in head shops and health food stores in some states where such sales have not been explicitly legalized.  Warning: Because it is not FDA approved for general use or treatment of pain, it is not required to undergo the same manufacturing controls as prescription drugs.  This means that the available cannabidiol (CBD) may be contaminated with THC.  If this is the case, it will trigger a positive urine drug screen (UDS) test for cannabinoids (Marijuana).  Because a positive UDS for illicit substances is a violation of our medication agreement, your opioid analgesics (pain medicine) may be permanently discontinued. (Last update: 07/02/2017) ____________________________________________________________________________________________    

## 2017-11-16 ENCOUNTER — Ambulatory Visit: Payer: Medicare Other | Admitting: Student in an Organized Health Care Education/Training Program

## 2017-11-17 ENCOUNTER — Telehealth: Payer: Self-pay | Admitting: *Deleted

## 2017-11-17 ENCOUNTER — Other Ambulatory Visit: Payer: Self-pay

## 2017-11-17 ENCOUNTER — Encounter: Payer: Self-pay | Admitting: Pain Medicine

## 2017-11-17 ENCOUNTER — Ambulatory Visit: Payer: No Typology Code available for payment source | Attending: Pain Medicine | Admitting: Pain Medicine

## 2017-11-17 VITALS — BP 147/83 | HR 90 | Temp 98.6°F | Resp 18 | Ht 70.0 in | Wt 340.0 lb

## 2017-11-17 DIAGNOSIS — Z79899 Other long term (current) drug therapy: Secondary | ICD-10-CM | POA: Diagnosis not present

## 2017-11-17 DIAGNOSIS — K5903 Drug induced constipation: Secondary | ICD-10-CM

## 2017-11-17 DIAGNOSIS — G8929 Other chronic pain: Secondary | ICD-10-CM

## 2017-11-17 DIAGNOSIS — T402X5A Adverse effect of other opioids, initial encounter: Secondary | ICD-10-CM

## 2017-11-17 DIAGNOSIS — M79604 Pain in right leg: Secondary | ICD-10-CM | POA: Diagnosis not present

## 2017-11-17 DIAGNOSIS — M792 Neuralgia and neuritis, unspecified: Secondary | ICD-10-CM | POA: Insufficient documentation

## 2017-11-17 DIAGNOSIS — M5441 Lumbago with sciatica, right side: Secondary | ICD-10-CM | POA: Insufficient documentation

## 2017-11-17 DIAGNOSIS — Z451 Encounter for adjustment and management of infusion pump: Secondary | ICD-10-CM

## 2017-11-17 DIAGNOSIS — G894 Chronic pain syndrome: Secondary | ICD-10-CM

## 2017-11-17 DIAGNOSIS — M79671 Pain in right foot: Secondary | ICD-10-CM

## 2017-11-17 DIAGNOSIS — R110 Nausea: Secondary | ICD-10-CM

## 2017-11-17 DIAGNOSIS — Z79891 Long term (current) use of opiate analgesic: Secondary | ICD-10-CM

## 2017-11-17 DIAGNOSIS — M51379 Other intervertebral disc degeneration, lumbosacral region without mention of lumbar back pain or lower extremity pain: Secondary | ICD-10-CM

## 2017-11-17 DIAGNOSIS — R112 Nausea with vomiting, unspecified: Secondary | ICD-10-CM

## 2017-11-17 DIAGNOSIS — M5442 Lumbago with sciatica, left side: Secondary | ICD-10-CM | POA: Diagnosis not present

## 2017-11-17 DIAGNOSIS — M79605 Pain in left leg: Secondary | ICD-10-CM | POA: Diagnosis not present

## 2017-11-17 DIAGNOSIS — M62838 Other muscle spasm: Secondary | ICD-10-CM

## 2017-11-17 DIAGNOSIS — Z888 Allergy status to other drugs, medicaments and biological substances status: Secondary | ICD-10-CM | POA: Diagnosis not present

## 2017-11-17 DIAGNOSIS — M5410 Radiculopathy, site unspecified: Secondary | ICD-10-CM

## 2017-11-17 DIAGNOSIS — K219 Gastro-esophageal reflux disease without esophagitis: Secondary | ICD-10-CM

## 2017-11-17 DIAGNOSIS — M5137 Other intervertebral disc degeneration, lumbosacral region: Secondary | ICD-10-CM

## 2017-11-17 DIAGNOSIS — Z789 Other specified health status: Secondary | ICD-10-CM

## 2017-11-17 DIAGNOSIS — T50905A Adverse effect of unspecified drugs, medicaments and biological substances, initial encounter: Secondary | ICD-10-CM

## 2017-11-17 DIAGNOSIS — M899 Disorder of bone, unspecified: Secondary | ICD-10-CM | POA: Diagnosis not present

## 2017-11-17 DIAGNOSIS — M7918 Myalgia, other site: Secondary | ICD-10-CM

## 2017-11-17 DIAGNOSIS — Z978 Presence of other specified devices: Secondary | ICD-10-CM

## 2017-11-17 DIAGNOSIS — M25551 Pain in right hip: Secondary | ICD-10-CM

## 2017-11-17 DIAGNOSIS — Z96652 Presence of left artificial knee joint: Secondary | ICD-10-CM

## 2017-11-17 DIAGNOSIS — M25562 Pain in left knee: Secondary | ICD-10-CM

## 2017-11-17 DIAGNOSIS — M961 Postlaminectomy syndrome, not elsewhere classified: Secondary | ICD-10-CM

## 2017-11-17 MED ORDER — ESOMEPRAZOLE MAGNESIUM 40 MG PO CPDR: 40.0000 mg | DELAYED_RELEASE_CAPSULE | Freq: Two times a day (BID) | ORAL | 2 refills | Status: DC

## 2017-11-17 MED ORDER — MORPHINE SULFATE ER 60 MG PO CP24: 60.0000 mg | ORAL_CAPSULE | Freq: Two times a day (BID) | ORAL | 0 refills | Status: DC

## 2017-11-17 MED ORDER — METHADONE HCL 5 MG PO TABS: 5.0000 mg | ORAL_TABLET | Freq: Every day | ORAL | 0 refills | Status: DC

## 2017-11-17 MED ORDER — PROMETHAZINE HCL 25 MG PO TABS
25.0000 mg | ORAL_TABLET | Freq: Two times a day (BID) | ORAL | 2 refills | Status: DC | PRN
Start: 2017-11-17 — End: 2018-01-05

## 2017-11-17 MED ORDER — GABAPENTIN ENACARBIL ER 600 MG PO TBCR: 600.0000 mg | EXTENDED_RELEASE_TABLET | Freq: Two times a day (BID) | ORAL | 2 refills | Status: DC

## 2017-11-17 MED ORDER — MAGNESIUM OXIDE -MG SUPPLEMENT 500 MG PO CAPS: 1.0000 | ORAL_CAPSULE | Freq: Two times a day (BID) | ORAL | 2 refills | Status: DC

## 2017-11-17 MED ORDER — BACLOFEN 10 MG PO TABS: 10.0000 mg | ORAL_TABLET | Freq: Three times a day (TID) | ORAL | 2 refills | Status: DC

## 2017-11-17 MED ORDER — POLYETHYLENE GLYCOL 3350 17 G PO PACK: 17.0000 g | PACK | Freq: Two times a day (BID) | ORAL | 2 refills | Status: DC

## 2017-11-17 NOTE — Telephone Encounter (Signed)
Ok to dispense tablets instead of capsules per Dr. Laban EmperorNaveira. Pharmacist Misty StanleyLisa notified.

## 2017-11-17 NOTE — Patient Instructions (Signed)

## 2017-11-17 NOTE — Progress Notes (Signed)
Patient's Name: [name]  MRN: [id number]  Referring Provider: Shirline Frees, MD  DOB: Dec 28, 1965  PCP: Shirline Frees, MD  DOS: 11/17/2017  Note by: Gaspar Cola, MD  Service setting: Ambulatory outpatient  Specialty: Interventional Pain Management  Patient type: Established  Location: [facility] (AMB) Pain Management Facility  Visit type: Interventional Procedure   Primary Reason for Visit: Interventional Pain Management Treatment. CC: Back Pain (lower)  Procedure:          Intrathecal Drug Delivery System (IDDS):  Type: Reservoir Refill [phone]) No rate change Region: Abdominal Laterality: Left  Type of Pump: Medtronic Synchromed II (MRI-compatible) Delivery Route: Intrathecal Type of Pain Treated: Neuropathic/Nociceptive Primary Medication Class: Opioid/opiate  Medication, Concentration, Infusion Program, & Delivery Rate: Please see scanned programming printout.   Indications: 1. Chronic pain syndrome   2. Failed back surgical syndrome   3. Chronic low back pain (Bilateral) w/ sciatica (Bilateral)   4. Chronic lower extremity pain (Bilateral) (L>R)   5. Chronic lower extremity radicular pain   6. Presence of intrathecal pump   7. Encounter for interrogation of infusion pump    Pain Assessment: Self-Reported Pain Score: 5 /10             Reported level is compatible with observation.        Today we will be refilling his intrathecal pump for the first time and we will commence a downward taper of most of his oral medications starting with the alprazolam. On the patient's last visit he was instructed to start going down by 1 pill every 7 days until he could completely stop it. Today we will do the same with the methadone. We'll call the pharmacy to make sure that all of these medications are canceled except for the prescriptions that were writing. He should be getting no more refills on the alprazolam. Unfortunately, there has been a delay secondary to [employer]  and the patient returns today without his lab work or x-rays. Today we have informed the patient that he has 2 weeks to have these done, otherwise we'll will not be able to continue taking care of him. We have an extremely strict protocol when we manage control substances and until we get the lab work done and x-rays, he is not following protocol. Since were not flexible with regards to this, this may represent a reason for [place] to discontinue the care of this patient. The patient was informed of this. Furthermore, I need to insist that these be done at a [place], so that the results will follow directly into the chart. I am painfully aware of how Worker's Compensation tends to some patients to distant places to have this to his done, but in this case this would be unacceptable. The information needed to justify the use of these controlled substances must be readily accessible to [place].  Today I also informed the patient that the current concentration of morphine (25 mg/mL) in the pump is above that recommended by the 2012 Polyanalgesic Consensus Conference expert panel recommendations (20 mg/mL). The patient was reminded that these higher doses are associated with the development of intrathecal bladder neuromas. In addition I doses can lead to progress dictation of the morphine, creating clumps that can then damage to intrathecal pump tubing. At a later date we will go over some options on how to minimize this problem by adding some bupivacaine to the mixture.  The patient indicates that he took his last alprazolam approximately 3 days  ago. We have taken position of the pills that he had left and we have destroyed them in front of him and witnesses. We have also called the pharmacy and canceled any refills. The patient was instructed to turn his attention to the methadone, in approximately 2-3 more days and then start going down to 1 tablet per day. No refills were provided to the patient for this  medication. The plan is that once he has been off of the methadone for at least 7 days, we will then turn our attention to his morphine. This will be slowly tapered down as well. He understood and accepted.  Intrathecal Pump Therapy Assessment  Manufacturer: Medtronic Synchromed II Type: Programmable Volume: 40 mL reservoir MRI compatibility: Yes   Drug content:  Primary Medication Class: Opioid Primary Medication: PF-Morphine 25 mg/mL  Secondary Medication: see pump readout Other Medication: see pump readout   Programming:  Type: Simple continuous. See pump readout for details.   Changes:  Medication Change: None at this point Rate Change: No change in rate  Reported side-effects or adverse reactions: None reported  Effectiveness: Described as relatively effective, allowing for increase in activities of daily living (ADL) Clinically meaningful improvement in function (CMIF): Sustained CMIF goals met  Plan: Pump refill today  Pre-op Assessment:  [name] is a 52 y.o. (year old), male patient, seen today for interventional treatment. He  has a past surgical history that includes Back surgery; morphine pump; Tonsillectomy; Joint replacement; Shoulder arthroscopy with subacromial decompression, rotator cuff repair and bicep tendon repair (Right, 08/18/2013); Shoulder arthroscopy with subacromial decompression and bicep tendon repair (Left, 10/06/2013); and Laparoscopic gastric sleeve resection (N/A, 01/08/2015). [name] has a current medication list which includes the following prescription(s): PRESCRIPTION MEDICATION, baclofen, esomeprazole, gabapentin enacarbil, magnesium oxide, morphine, polyethylene glycol, and promethazine. His primarily concern today is the Back Pain (lower)  Initial Vital Signs:  Pulse/HCG Rate: 90  Temp: 98.6 F (37 C) Resp: 18 BP: (!) 147/83 SpO2: 100 %  BMI: Estimated body mass index is 48.78 kg/m as calculated from the following:   Height as of  this encounter: 5' 10" (1.778 m).   Weight as of this encounter: 340 lb (154.2 kg).  Risk Assessment: Allergies: Reviewed. He is allergic to tape.  Allergy Precautions: None required Coagulopathies: Reviewed. None identified.  Blood-thinner therapy: None at this time Active Infection(s): Reviewed. None identified. Mr. Pitta is afebrile  Site Confirmation: Mr. Schellenberg was asked to confirm the procedure and laterality before marking the site Procedure checklist: Completed Consent: Before the procedure and under the influence of no sedative(s), amnesic(s), or anxiolytics, the patient was informed of the treatment options, risks and possible complications. To fulfill our ethical and legal obligations, as recommended by the American Medical Association's Code of Ethics, I have informed the patient of my clinical impression; the nature and purpose of the treatment or procedure; the risks, benefits, and possible complications of the intervention; the alternatives, including doing nothing; the risk(s) and benefit(s) of the alternative treatment(s) or procedure(s); and the risk(s) and benefit(s) of doing nothing.  Mr. Soules was provided with information about the general risks and possible complications associated with most interventional procedures. These include, but are not limited to: failure to achieve desired goals, infection, bleeding, organ or nerve damage, allergic reactions, paralysis, and/or death.  In addition, he was informed of those risks and possible complications associated to this particular procedure, which include, but are not limited to: damage to the implant; failure to decrease pain;  local, systemic, or serious CNS infections, intraspinal abscess with possible cord compression and paralysis, or life-threatening such as meningitis; bleeding; organ damage; nerve injury or damage with subsequent sensory, motor, and/or autonomic system dysfunction, resulting in transient or permanent  pain, numbness, and/or weakness of one or several areas of the body; allergic reactions, either minor or major life-threatening, such as anaphylactic or anaphylactoid reactions.  Furthermore, Mr. Speegle was informed of those risks and complications associated with the medications. These include, but are not limited to: allergic reactions (i.e.: anaphylactic or anaphylactoid reactions); endorphine suppression; bradycardia and/or hypotension; water retention and/or peripheral vascular relaxation leading to lower extremity edema and possible stasis ulcers; respiratory depression and/or shortness of breath; decreased metabolic rate leading to weight gain; swelling or edema; medication-induced neural toxicity; particulate matter embolism and blood vessel occlusion with resultant organ, and/or nervous system infarction; and/or intrathecal granuloma formation with possible spinal cord compression and permanent paralysis.  Before refilling the pump Mr. Benak was informed that some of the medications used in the devise may not be FDA approved for such use and therefore it constitutes an off-label use of the medications.  Finally, he was informed that Medicine is not an exact science; therefore, there is also the possibility of unforeseen or unpredictable risks and/or possible complications that may result in a catastrophic outcome. The patient indicated having understood very clearly. We have given the patient no guarantees and we have made no promises. Enough time was given to the patient to ask questions, all of which were answered to the patient's satisfaction. Mr. Waltman has indicated that he wanted to continue with the procedure. Attestation: I, the ordering provider, attest that I have discussed with the patient the benefits, risks, side-effects, alternatives, likelihood of achieving goals, and potential problems during recovery for the procedure that I have provided informed consent. Date  Time: 11/17/2017  10:39 AM  Pre-Procedure Preparation:  Monitoring: As per clinic protocol. Respiration, ETCO2, SpO2, BP, heart rate and rhythm monitor placed and checked for adequate function Safety Precautions: Patient was assessed for positional comfort and pressure points before starting the procedure. Time-out: I initiated and conducted the "Time-out" before starting the procedure, as per protocol. The patient was asked to participate by confirming the accuracy of the "Time Out" information. Verification of the correct person, site, and procedure were performed and confirmed by me, the nursing staff, and the patient. "Time-out" conducted as per Joint Commission's Universal Protocol (UP.01.01.01). Time: 1130  Description of Procedure:          Position: Supine Target Area: Central-port of intrathecal pump. Approach: Anterior, 90 degree angle approach. Area Prepped: Entire Area around the pump implant. Prepping solution: ChloraPrep (2% chlorhexidine gluconate and 70% isopropyl alcohol) Safety Precautions: Aspiration looking for blood return was conducted prior to all injections. At no point did we inject any substances, as a needle was being advanced. No attempts were made at seeking any paresthesias. Safe injection practices and needle disposal techniques used. Medications properly checked for expiration dates. SDV (single dose vial) medications used. Description of the Procedure: Protocol guidelines were followed. Two nurses trained to do implant refills were present during the entire procedure. The refill medication was checked by both healthcare providers as well as the patient. The patient was included in the "Time-out" to verify the medication. The patient was placed in position. The pump was identified. The area was prepped in the usual manner. The sterile template was positioned over the pump, making sure the side-port location matched that  of the pump. Both, the pump and the template were held for  stability. The needle provided in the Medtronic Kit was then introduced thru the center of the template and into the central port. The pump content was aspirated and discarded volume documented. The new medication was slowly infused into the pump, thru the filter, making sure to avoid overpressure of the device. The needle was then removed and the area cleansed, making sure to leave some of the prepping solution back to take advantage of its long term bactericidal properties. The pump was interrogated and programmed to reflect the correct medication, volume, and dosage. The program was printed and taken to the physician for approval. Once checked and signed by the physician, a copy was provided to the patient and another scanned into the EMR. Vitals:   11/17/17 1037  BP: (!) 147/83  Pulse: 90  Resp: 18  Temp: 98.6 F (37 C)  TempSrc: Oral  SpO2: 100%  Weight: (!) 340 lb (154.2 kg)  Height: 5' 10" (1.778 m)    Start Time: 1130 hrs. End Time: 1145 hrs. Materials & Medications: Medtronic Refill Kit Medication(s): Please see chart orders for details.  Imaging Guidance:          Type of Imaging Technique: None used Indication(s): N/A Exposure Time: No patient exposure Contrast: None used. Fluoroscopic Guidance: N/A Ultrasound Guidance: N/A Interpretation: N/A  Antibiotic Prophylaxis:   Anti-infectives (From admission, onward)   None     Indication(s): None identified  Post-operative Assessment:  Post-procedure Vital Signs:  Pulse/HCG Rate: 90  Temp: 98.6 F (37 C) Resp: 18 BP: (!) 147/83 SpO2: 100 %  EBL: None  Complications: No immediate post-treatment complications observed by team, or reported by patient.  Note: The patient tolerated the entire procedure well. A repeat set of vitals were taken after the procedure and the patient was kept under observation following institutional policy, for this type of procedure. Post-procedural neurological assessment was performed,  showing return to baseline, prior to discharge. The patient was provided with post-procedure discharge instructions, including a section on how to identify potential problems. Should any problems arise concerning this procedure, the patient was given instructions to immediately contact us, at any time, without hesitation. In any case, we plan to contact the patient by telephone for a follow-up status report regarding this interventional procedure.  Comments:  No additional relevant information.  Controlled Substance Pharmacotherapy Assessment REMS (Risk Evaluation and Mitigation Strategy)  Analgesic: methadone 5 mg twice a day; morphine ER 60 mg twice a day; + intrathecal morphine + alprazolam 0.5 mg by mouth 3 times a day. Highest recorded MME/day: 150 mg/day + intrathecal morphine  MME/day: 150 mg/day (Not counting intrathecal opioids)  Dewayne Shorter, RN  11/17/2017 11:57 AM  Addendum Nursing Pain Medication Assessment:  Safety precautions to be maintained throughout the outpatient stay will include: orient to surroundings, keep bed in low position, maintain call bell within reach at all times, provide assistance with transfer out of bed and ambulation.  Medication Inspection Compliance: Pill count conducted under aseptic conditions, in front of the patient. Neither the pills nor the bottle was removed from the patient's sight at any time. Once count was completed pills were immediately returned to the patient in their original bottle.  Medication #1: Methadone Pill/Patch Count: 19 of 60 pills remain Pill/Patch Appearance: Markings consistent with prescribed medication Bottle Appearance: Standard pharmacy container. Clearly labeled. Filled Date: 07/ 09 / 2019 Last Medication intake:  last took day before  yesterday  Medication #2: Morphine ER (MSContin) 60 mg Pill/Patch Count: 4 of 60 pills remain Pill/Patch Appearance: Markings consistent with prescribed medication Bottle Appearance: Standard  pharmacy container. Clearly labeled. Filled Date:07/ 09/ 2019 Last Medication intake:  Today   alprazalam 0.5 mg 24/90 Filled 10/20/2017   Pharmacokinetics: Liberation and absorption (onset of action): WNL Distribution (time to peak effect): WNL Metabolism and excretion (duration of action): WNL         Pharmacodynamics: Desired effects: Analgesia: Mr. Lowrimore reports >50% benefit. Functional ability: Patient reports that medication allows him to accomplish basic ADLs Clinically meaningful improvement in function (CMIF): Sustained CMIF goals met Perceived effectiveness: Described as relatively effective, allowing for increase in activities of daily living (ADL) Undesirable effects: Side-effects or Adverse reactions: None reported Monitoring: [place] PMP: Online review of the past 56-monthperiod conducted. Compliant with practice rules and regulations Last UDS on record: No results found for: SUMMARY UDS interpretation: Compliant          Medication Assessment Form: Reviewed. Patient indicates being compliant with therapy Treatment compliance: Compliant Risk Assessment Profile: Aberrant behavior: See prior evaluations. None observed or detected today Comorbid factors increasing risk of overdose: See prior notes. No additional risks detected today Risk of substance use disorder (SUD): Low Opioid Risk Tool - 11/09/17 1051      Family History of Substance Abuse   Alcohol  Negative    Illegal Drugs  Negative    Rx Drugs  Negative      Personal History of Substance Abuse   Alcohol  Negative    Illegal Drugs  Negative    Rx Drugs  Negative      Age   Age between 145-45years   No      History of Preadolescent Sexual Abuse   History of Preadolescent Sexual Abuse  Negative or Male      Psychological Disease   Psychological Disease  Negative    Depression  Negative      Total Score   Opioid Risk Tool Scoring  0    Opioid Risk Interpretation  Low Risk      ORT Scoring  interpretation table:  Score <3 = Low Risk for SUD  Score between 4-7 = Moderate Risk for SUD  Score >8 = High Risk for Opioid Abuse   Risk Mitigation Strategies:  Patient Counseling: Covered Patient-Prescriber Agreement (PPA): Present and active  Notification to other healthcare providers: Done  Pharmacologic Plan: No change in therapy, at this time.             Assessment  Primary Diagnosis & Pertinent Problem List: The primary encounter diagnosis was Chronic pain syndrome. Diagnoses of Failed back surgical syndrome, Chronic low back pain (Bilateral) w/ sciatica (Bilateral), Chronic lower extremity pain (Bilateral) (L>R), Chronic lower extremity radicular pain, Presence of intrathecal pump, Encounter for interrogation of infusion pump, Long term prescription benzodiazepine use, Chronic musculoskeletal pain, Muscle spasticity, Gastroesophageal reflux disease without esophagitis, Neurogenic pain, Chronic nausea, Drug-induced nausea and vomiting, Therapeutic opioid-induced constipation (OIC), Long term prescription opiate use, Chronic foot pain (Right), Chronic knee pain (Left), Hx of total knee replacement (Left), Chronic hip pain (Right), DDD (degenerative disc disease), lumbosacral, Pharmacologic therapy, Disorder of skeletal system, and Problems influencing health status were also pertinent to this visit.  Status Diagnosis  Controlled Stable Persistent 1. Chronic pain syndrome   2. Failed back surgical syndrome   3. Chronic low back pain (Bilateral) w/ sciatica (Bilateral)   4. Chronic lower extremity pain (  Bilateral) (L>R)   5. Chronic lower extremity radicular pain   6. Presence of intrathecal pump   7. Encounter for interrogation of infusion pump   8. Long term prescription benzodiazepine use   9. Chronic musculoskeletal pain   10. Muscle spasticity   11. Gastroesophageal reflux disease without esophagitis   12. Neurogenic pain   13. Chronic nausea   14. Drug-induced nausea and  vomiting   15. Therapeutic opioid-induced constipation (OIC)   16. Long term prescription opiate use   17. Chronic foot pain (Right)   18. Chronic knee pain (Left)   19. Hx of total knee replacement (Left)   20. Chronic hip pain (Right)   21. DDD (degenerative disc disease), lumbosacral   22. Pharmacologic therapy   23. Disorder of skeletal system   24. Problems influencing health status     Problems updated and reviewed during this visit: Problem  Chronic Musculoskeletal Pain  Muscle Spasticity  Neurogenic Pain  Drug-Induced Nausea and Vomiting  Therapeutic Opioid-Induced Constipation (Oic)  Gastroesophageal Reflux Disease Without Esophagitis   Plan of Care   Imaging Orders  No imaging studies ordered today    Procedure Orders     PUMP REFILL     PUMP REFILL  Medications ordered for procedure: Meds ordered this encounter  Medications  . DISCONTD: methadone (DOLOPHINE) 5 MG tablet    Sig: Take 1 tablet (5 mg total) by mouth daily for 7 days.    Dispense:  7 tablet    Refill:  0    This prescription is part of a downward opioid taper. Fill instructions must be followed exactly as written to avoid withdrawal. Fill date:  . baclofen (LIORESAL) 10 MG tablet    Sig: Take 1 tablet (10 mg total) by mouth 3 (three) times daily.    Dispense:  90 tablet    Refill:  2    Do not place medication on "Automatic Refill". Fill one day early if pharmacy is closed on scheduled refill date.  . esomeprazole (NEXIUM) 40 MG capsule    Sig: Take 1 capsule (40 mg total) by mouth 2 (two) times daily.    Dispense:  60 capsule    Refill:  2    Do not place medication on "Automatic Refill". Fill one day early if pharmacy is closed on scheduled refill date.  . Magnesium Oxide 500 MG CAPS    Sig: Take 1 capsule (500 mg total) by mouth 2 (two) times daily at 8 am and 10 pm.    Dispense:  60 capsule    Refill:  2    Do not place medication on "Automatic Refill". Fill one day early if pharmacy is  closed on scheduled refill date.  . Gabapentin Enacarbil (HORIZANT) 600 MG TBCR    Sig: Take 600 mg by mouth 2 (two) times daily.    Dispense:  60 tablet    Refill:  2    Do not place medication on "Automatic Refill". Fill one day early if pharmacy is closed on scheduled refill date.  . promethazine (PHENERGAN) 25 MG tablet    Sig: Take 1 tablet (25 mg total) by mouth 2 (two) times daily as needed for nausea or vomiting.    Dispense:  30 tablet    Refill:  2    Do not place medication on "Automatic Refill". Fill one day early if pharmacy is closed on scheduled refill date.  . polyethylene glycol (MIRALAX / GLYCOLAX) packet    Sig: Take  17 g by mouth 2 (two) times daily.    Dispense:  60 each    Refill:  2    Do not place medication on "Automatic Refill". Fill one day early if pharmacy is closed on scheduled refill date.  Marland Kitchen DISCONTD: morphine (KADIAN) 60 MG 24 hr capsule    Sig: Take 1 capsule (60 mg total) by mouth every 12 (twelve) hours for 14 days.    Dispense:  28 capsule    Refill:  0    This prescription is part of a downward opioid taper. Fill instructions must be followed exactly as written to avoid withdrawal. Fill date:  . morphine (KADIAN) 60 MG 24 hr capsule    Sig: Take 1 capsule (60 mg total) by mouth every 12 (twelve) hours for 14 days.    Dispense:  28 capsule    Refill:  0    This prescription is part of a downward opioid taper. Fill instructions must be followed exactly as written to avoid withdrawal. Fill date: 11/18/17  To Last until: 12/02/17   Medications administered: Jeneen Rinks B. Roca had no medications administered during this visit.  See the medical record for exact dosing, route, and time of administration.  New Prescriptions   MAGNESIUM OXIDE 500 MG CAPS    Take 1 capsule (500 mg total) by mouth 2 (two) times daily at 8 am and 10 pm.   Disposition: Discharge home  Discharge Date & Time: 11/17/2017; 1230 hrs.   Physician-requested Follow-up: Return in  about 2 weeks (around 12/01/2017) for Med-Mgmt, w/ Dr. Dossie Arbour.  Future Appointments  Date Time Provider [place]  12/02/2017 10:45 AM Milinda Pointer, MD [facility] None   Primary Care Physician: Shirline Frees, MD Location: [facility] Note by: Gaspar Cola, MD Date: 11/17/2017; Time: 12:44 PM  Disclaimer:  Medicine is not an Chief Strategy Officer. The only guarantee in medicine is that nothing is guaranteed. It is important to note that the decision to proceed with this intervention was based on the information collected from the patient. The Data and conclusions were drawn from the patient's questionnaire, the interview, and the physical examination. Because the information was provided in large part by the patient, it cannot be guaranteed that it has not been purposely or unconsciously manipulated. Every effort has been made to obtain as much relevant data as possible for this evaluation. It is important to note that the conclusions that lead to this procedure are derived in large part from the available data. Always take into account that the treatment will also be dependent on availability of resources and existing treatment guidelines, considered by other Pain Management Practitioners as being common knowledge and practice, at the time of the intervention. For Medico-Legal purposes, it is also important to point out that variation in procedural techniques and pharmacological choices are the acceptable norm. The indications, contraindications, technique, and results of the above procedure should only be interpreted and judged by a Board-Certified Interventional Pain Specialist with extensive familiarity and expertise in the same exact procedure and technique.

## 2017-11-17 NOTE — Progress Notes (Addendum)
Nursing Pain Medication Assessment:  Safety precautions to be maintained throughout the outpatient stay will include: orient to surroundings, keep bed in low position, maintain call bell within reach at all times, provide assistance with transfer out of bed and ambulation.  Medication Inspection Compliance: Pill count conducted under aseptic conditions, in front of the patient. Neither the pills nor the bottle was removed from the patient's sight at any time. Once count was completed pills were immediately returned to the patient in their original bottle.  Medication #1: Methadone Pill/Patch Count: 19 of 60 pills remain Pill/Patch Appearance: Markings consistent with prescribed medication Bottle Appearance: Standard pharmacy container. Clearly labeled. Filled Date: 07/ 09 / 2019 Last Medication intake:  last took day before yesterday  Medication #2: Morphine ER (MSContin) 60 mg Pill/Patch Count: 4 of 60 pills remain Pill/Patch Appearance: Markings consistent with prescribed medication Bottle Appearance: Standard pharmacy container. Clearly labeled. Filled Date:07/ 09/ 2019 Last Medication intake:  Today   alprazalam 0.5 mg 24/90 Filled 10/20/2017

## 2017-11-17 NOTE — Progress Notes (Signed)
Wasted Alprazolam #24 in toilet, witnessed by Michelle NasutiVicki Neese, RN and patient.

## 2017-11-17 NOTE — Telephone Encounter (Signed)
Prescription is for capsules. Patient has been getting tabs. Can we change it?

## 2017-11-18 ENCOUNTER — Telehealth: Payer: Self-pay

## 2017-11-18 NOTE — Telephone Encounter (Signed)
Post pump refill. Patient states he is doing good.  °

## 2017-11-21 LAB — COMP. METABOLIC PANEL (12)
A/G RATIO: 1.5 (ref 1.2–2.2)
ALK PHOS: 94 IU/L (ref 39–117)
AST: 22 IU/L (ref 0–40)
Albumin: 4.3 g/dL (ref 3.5–5.5)
BUN/Creatinine Ratio: 13 (ref 9–20)
BUN: 10 mg/dL (ref 6–24)
Bilirubin Total: 0.3 mg/dL (ref 0.0–1.2)
CALCIUM: 9.7 mg/dL (ref 8.7–10.2)
CHLORIDE: 98 mmol/L (ref 96–106)
Creatinine, Ser: 0.78 mg/dL (ref 0.76–1.27)
GFR calc non Af Amer: 105 mL/min/{1.73_m2} (ref >59)
GFR, EST AFRICAN AMERICAN: 121 mL/min/{1.73_m2} (ref >59)
GLOBULIN, TOTAL: 2.9 g/dL (ref 1.5–4.5)
Glucose: 101 mg/dL — ABNORMAL HIGH (ref 65–99)
POTASSIUM: 4.2 mmol/L (ref 3.5–5.2)
Sodium: 140 mmol/L (ref 134–144)
Total Protein: 7.2 g/dL (ref 6.0–8.5)

## 2017-11-21 LAB — SEDIMENTATION RATE: SED RATE: 41 mm/h — AB (ref 0–30)

## 2017-11-21 LAB — 25-HYDROXY VITAMIN D LCMS D2+D3
25-Hydroxy, Vitamin D-2: <1 ng/mL
25-Hydroxy, Vitamin D-3: 15 ng/mL
25-Hydroxy, Vitamin D: 15 ng/mL — ABNORMAL LOW

## 2017-11-21 LAB — VITAMIN B12: Vitamin B-12: 342 pg/mL (ref 232–1245)

## 2017-11-21 LAB — MAGNESIUM: Magnesium: 1.9 mg/dL (ref 1.6–2.3)

## 2017-11-21 LAB — C-REACTIVE PROTEIN: CRP: 5 mg/L (ref 0–10)

## 2017-11-21 LAB — COMPLIANCE DRUG ANALYSIS, UR

## 2017-11-23 ENCOUNTER — Other Ambulatory Visit: Payer: Self-pay | Admitting: Nurse Practitioner

## 2017-11-23 ENCOUNTER — Telehealth: Payer: Self-pay | Admitting: *Deleted

## 2017-11-23 MED ORDER — CLONIDINE HCL 0.1 MG/24HR TD PTWK: 0.1000 mg | MEDICATED_PATCH | TRANSDERMAL | 0 refills | Status: DC

## 2017-11-23 MED ORDER — ONDANSETRON 8 MG PO TBDP: 8.0000 mg | ORAL_TABLET | Freq: Two times a day (BID) | ORAL | 0 refills | Status: DC

## 2017-11-23 NOTE — Telephone Encounter (Signed)
Patient notified that medications were sent in as ordered by Crystal.  Instructed patient to go to the ED if continued disorientation or falls.  Patient states understanding.  Informed patient to call for any further questions or concerns.

## 2017-11-23 NOTE — Telephone Encounter (Signed)
Clonidine 0.1mg  patch I can prescribe for the shaking; sent Zofran for the nausea; sentHe can use over-the-counter Imodium for the diarrheaHe has morphine for the pain has he completed ?If he continues to have disorientation and fall and he may need to go to the for further evaluation!

## 2017-11-23 NOTE — Telephone Encounter (Signed)
Spoke with patient.  States that Dr Laban EmperorNaveira is weaning him off of his medications.  States he has been off of his Xanax since 11-15-17 and has been taking only 1 Methadone per day for 3 days.  Was previously taking 3 Methadone per day.  States that since Friday, he has had increased pain, shaking, nausea, diarrhea, disorientation and has been falling.  Please advise.

## 2017-11-25 MED FILL — Medication: INTRATHECAL | Qty: 1 | Status: AC

## 2017-11-27 ENCOUNTER — Ambulatory Visit (HOSPITAL_BASED_OUTPATIENT_CLINIC_OR_DEPARTMENT_OTHER)
Admission: RE | Admit: 2017-11-27 | Discharge: 2017-11-27 | Disposition: A | Discharge status: Home / Self Care | Payer: No Typology Code available for payment source | Source: Ambulatory Visit | Attending: Pain Medicine | Admitting: Pain Medicine

## 2017-11-27 DIAGNOSIS — M47814 Spondylosis without myelopathy or radiculopathy, thoracic region: Secondary | ICD-10-CM | POA: Insufficient documentation

## 2017-11-27 DIAGNOSIS — Z9689 Presence of other specified functional implants: Secondary | ICD-10-CM | POA: Diagnosis not present

## 2017-11-27 DIAGNOSIS — M79604 Pain in right leg: Secondary | ICD-10-CM | POA: Diagnosis not present

## 2017-11-27 DIAGNOSIS — M961 Postlaminectomy syndrome, not elsewhere classified: Secondary | ICD-10-CM | POA: Diagnosis not present

## 2017-11-27 DIAGNOSIS — Z96652 Presence of left artificial knee joint: Secondary | ICD-10-CM | POA: Diagnosis not present

## 2017-11-27 DIAGNOSIS — M5117 Intervertebral disc disorders with radiculopathy, lumbosacral region: Secondary | ICD-10-CM | POA: Insufficient documentation

## 2017-11-27 DIAGNOSIS — G8929 Other chronic pain: Secondary | ICD-10-CM | POA: Diagnosis not present

## 2017-11-27 DIAGNOSIS — M79605 Pain in left leg: Secondary | ICD-10-CM | POA: Diagnosis not present

## 2017-12-01 DIAGNOSIS — Z96652 Presence of left artificial knee joint: Secondary | ICD-10-CM

## 2017-12-01 DIAGNOSIS — G8929 Other chronic pain: Secondary | ICD-10-CM | POA: Insufficient documentation

## 2017-12-01 DIAGNOSIS — M25562 Pain in left knee: Secondary | ICD-10-CM

## 2017-12-01 NOTE — Progress Notes (Signed)
Patient's Name: [name]  MRN: [id number]  Referring Provider: Shirline Frees, MD  DOB: 02/12/1966  PCP: Shirline Frees, MD  DOS: 12/02/2017  Note by: Gaspar Cola, MD  Service setting: Ambulatory outpatient  Specialty: Interventional Pain Management  Location: [facility] (AMB) Pain Management Facility    Patient type: Established   Primary Reason(s) for Visit: Encounter for prescription drug management. (Level of risk: moderate)  CC: Back Pain  HPI  Mr. [name] is a 52 y.o. year old, male patient, who comes today for a medication management evaluation. He has Chronic knee pain (Left); DDD (degenerative disc disease), lumbosacral; S/P rotator cuff repair (Bilateral); Dyspnea; Severe obesity (BMI >= 40) ([place]); OSA complicated by mild polycythemia ; Polycythemia, secondary; S/P laparoscopic sleeve gastrectomy Sept 2016; Chronic pain syndrome; Long term prescription benzodiazepine use; Long term prescription opiate use; Opiate use (> 150 MME/day); Long-term use of high-risk medication; Pharmacologic therapy; Disorder of skeletal system; Problems influencing health status; Chronic nausea; Presence of intrathecal pump; Encounter for interrogation of infusion pump; Chronic foot pain (Right); Chronic hip pain (Right); Old tear of medial meniscus of knee (Left); Failed back surgical syndrome; Chronic low back pain (Bilateral) w/ sciatica (Bilateral); Chronic lower extremity pain (Bilateral) (L>R); Chronic lower extremity radicular pain; Chronic shoulder pain (Bilateral); Hx of total knee replacement (Left); History of arthroscopic surgery of shoulder (Bilateral); Chronic musculoskeletal pain; Muscle spasticity; Gastroesophageal reflux disease without esophagitis; Neurogenic pain; Drug-induced nausea and vomiting; Therapeutic opioid-induced constipation (OIC); Chronic knee pain after total replacement (Left); Spondylosis without myelopathy or radiculopathy, lumbosacral region; Lumbar facet syndrome  (Bilateral) (R>L); and Chronic low back pain (Primary Area of Pain) (Bilateral) (R>L) on their problem list. His primarily concern today is the Back Pain  Pain Assessment: Location: Right, Left, Lower Back Radiating: Pain radiaties down to buttock, around hips, down legs to feet and feet becomes numb Onset: More than a month ago Duration: Chronic pain, Neuropathic pain Quality: Dull, Numbness, Sharp, Cramping, Spasm, Constant Severity: 5 /10 (subjective, self-reported pain score)  Note: Reported level is compatible with observation.                         When using our objective Pain Scale, levels between 6 and 10/10 are said to belong in an emergency room, as it progressively worsens from a 6/10, described as severely limiting, requiring emergency care not usually available at an outpatient pain management facility. At a 6/10 level, communication becomes difficult and requires great effort. Assistance to reach the emergency department may be required. Facial flushing and profuse sweating along with potentially dangerous increases in heart rate and blood pressure will be evident. Effect on ADL: no prolonged walking or standing, alot of falls, legs  give out Timing: Constant Modifying factors: nothing BP: (!) 141/67  HR: 87  [name] was last scheduled for an appointment on 11/17/2017 for medication management. During today's appointment we reviewed Mr. Shiffer's chronic pain status, as well as his outpatient medication regimen. As expected, as his pain medication goes down, his pain has been increasing. Unfortunately, he attempted to be heroic and decreased his medications faster than recommended. He now realizes that this was not such a great idea. His primary pain today is that of the lower back which increases with hyperextension or rotation suggesting the possibility of bilateral lumbar facet syndrome. The x-rays were personally reviewed and there does seem to be some evidence of facet  arthropathy, bilaterally. Unfortunately, he has also gained weight  which has worked against him with regards to his lower back pain. I have talked to him about his weight and recommended that he try to bring his BMI to, or below 30.  The patient  reports that he does not use drugs. His body mass index is 48.78 kg/m.  Further details on both, my assessment(s), as well as the proposed treatment plan, please see below.  Controlled Substance Pharmacotherapy Assessment REMS (Risk Evaluation and Mitigation Strategy)  Starting Opioid Analgesics: methadone 5 mg twice a day + morphine ER 60 mg twice a day; + intrathecal morphine Current Opioid Analgesic: Morphine ER 60 mg 1 tablet twice a day (120 mg/day of morphine) + intrathecal morphine Highest recorded MME/day: 150 mg/day + intrathecal morphine  Starting MME/day: 150 mg/day (Not counting intrathecal opioids) Current MME: 120 mg/day (Not counting intrathecal opioids) Chauncey Fischer, RN  12/02/2017 12:31 PM  Sign at close encounter Nursing Pain Medication Assessment:  Safety precautions to be maintained throughout the outpatient stay will include: orient to surroundings, keep bed in low position, maintain call bell within reach at all times, provide assistance with transfer out of bed and ambulation.  Medication Inspection Compliance: Pill count conducted under aseptic conditions, in front of the patient. Neither the pills nor the bottle was removed from the patient's sight at any time. Once count was completed pills were immediately returned to the patient in their original bottle.  Medication #1: Methadone Pill/Patch Count: 1 of 60 pills remain Pill/Patch Appearance: Markings consistent with prescribed medication Bottle Appearance: Standard pharmacy container. Clearly labeled. Filled Date: 7/9/ 2019 Last Medication intake:  Day before yesterday  Medication #2: Morphine IR Pill/Patch Count: 1 of 28 pills remain Pill/Patch Appearance: Markings  consistent with prescribed medication Bottle Appearance: Standard pharmacy container. Clearly labeled. Filled Date: 8 / 7 / 2018 Last Medication intake:  Yesterday   Pharmacokinetics: Liberation and absorption (onset of action): WNL Distribution (time to peak effect): WNL Metabolism and excretion (duration of action): WNL         Pharmacodynamics: Desired effects: Analgesia: Mr. Lomas reports >50% benefit. Functional ability: Patient reports that medication allows him to accomplish basic ADLs Clinically meaningful improvement in function (CMIF): Sustained CMIF goals met Perceived effectiveness: Described as relatively effective, allowing for increase in activities of daily living (ADL) Undesirable effects: Side-effects or Adverse reactions: None reported Monitoring: [place] PMP: Online review of the past 89-monthperiod conducted. Compliant with practice rules and regulations Last UDS on record: Summary  Date Value Ref Range Status  11/17/2017 FINAL  Final    Comment:    ==================================================================== TOXASSURE COMP DRUG ANALYSIS,UR ==================================================================== Test                             Result       Flag       Units Drug Present and Declared for Prescription Verification   Morphine                       11563        EXPECTED   ng/mg creat   Normorphine                    156          EXPECTED   ng/mg creat    Potential sources of large amounts of morphine in the absence of    codeine include administration of morphine or use of  heroin.    Normorphine is an expected metabolite of morphine.   Gabapentin                     PRESENT      EXPECTED   Baclofen                       PRESENT      EXPECTED   Promethazine                   PRESENT      EXPECTED Drug Present not Declared for Prescription Verification   Alprazolam                     65           UNEXPECTED ng/mg creat    Alpha-hydroxyalprazolam        166          UNEXPECTED ng/mg creat    Source of alprazolam is a scheduled prescription medication.    Alpha-hydroxyalprazolam is an expected metabolite of alprazolam.   Methadone                      569          UNEXPECTED ng/mg creat   EDDP (Methadone Mtb)           378          UNEXPECTED ng/mg creat    Sources of methadone include scheduled prescription medications.    EDDP is an expected metabolite of methadone. ==================================================================== Test                      Result    Flag   Units      Ref Range   Creatinine              68               mg/dL      >=20 ==================================================================== Declared Medications:  The flagging and interpretation on this report are based on the  following declared medications.  Unexpected results may arise from  inaccuracies in the declared medications.  **Note: The testing scope of this panel includes these medications:  Baclofen (Lioresal)  Gabapentin  Morphine (Kadian)  Promethazine (Phenergan)  **Note: The testing scope of this panel does not include small to  moderate amounts of these reported medications:  Morphine Pump  **Note: The testing scope of this panel does not include following  reported medications:  Magnesium (Mag-Ox)  Omeprazole (Nexium)  Polyethylene Glycol ==================================================================== For clinical consultation, please call [phone]. ====================================================================    UDS interpretation: Compliant          Medication Assessment Form: Reviewed. Patient indicates being compliant with therapy Treatment compliance: Compliant Risk Assessment Profile: Aberrant behavior: See prior evaluations. None observed or detected today Comorbid factors increasing risk of overdose: See prior notes. No additional risks detected today Opioid risk tool  (ORT) (Total Score): 3 Personal History of Substance Abuse (SUD-Substance use disorder):  Alcohol: Negative  Illegal Drugs: Negative  Rx Drugs: Negative  ORT Risk Level calculation: Low Risk Risk of substance use disorder (SUD): Low Opioid Risk Tool - 12/02/17 1116      Family History of Substance Abuse   Alcohol  Positive Male    Illegal Drugs  Negative    Rx Drugs  Negative      Personal History of  Substance Abuse   Alcohol  Negative    Illegal Drugs  Negative    Rx Drugs  Negative      Total Score   Opioid Risk Tool Scoring  3    Opioid Risk Interpretation  Low Risk      ORT Scoring interpretation table:  Score <3 = Low Risk for SUD  Score between 4-7 = Moderate Risk for SUD  Score >8 = High Risk for Opioid Abuse   Risk Mitigation Strategies:  Patient Counseling: Covered Patient-Prescriber Agreement (PPA): Present and active  Notification to other healthcare providers: Done  Pharmacologic Plan: By now, the patient should've stopped his benzodiazepines and the methadone. To control his pain, we plan to add local anesthetics to his intrathecal pump infusion. By now the patient's total daily morphine milligram equivalent, without counting the intrathecal morphine, has been decreased from 150 to 120 MME's. At this point, he is taking morphine ER 60 mg twice a day, for a total of 120 mg/day. Today we will divide his medication so as to slowly continue tapering his opioids sound. The first step will be to switch to morphine ER from 60 mg to 30 mg. He will be taking 1 tablet by mouth twice a day for a total of 60 mg/day of morphine. The remaining 60 mg/day will be converted to oxycodone IR. 40 mg per day of oxycodone is an equivalent to 60 mg per day of morphine. To smooth the transition, we will divide to 40 mg per day in 5 mg tablets, which would be an equivalent of 8 tablets per day. However, because we will be tapering this down, his first prescription will be for 7 tablets per day  and we will continue to decrease this by one daily tablet every 7 days. By my calculations, on 01/20/2018 he will be completely off of the oxycodone taper, remaining only on the morphine ER 30 mg twice a day, for a total of 60 MME's. We will have him stay at that particular dose for approximately 9 days, at which time we will see him back to continue tapering the medication down. He should be returning on or before 01/31/2018, at which time he would be running out of the morphine ER 30 mg pills. At that time, we will take the remainder 60 mg/day of extended release morphine and convert that into the oxycodone 5 mg taper and we will continue to go down until we're completely off of all oral opioid analgesics. Should the patient develop any flareups of his pain, these will be managed with interventional therapies. Once he is off of the oral narcotics for at least 2-3 weeks ("Drug Holiday"), then we will reassess his level of pain and determine if we need to go back to a lower dose of the opioids. In any case, by completing that drug holiday, the patient should lose some of his opioid tolerance. This will bring his daily MME to a safer level.  Laboratory Chemistry  Inflammation Markers (CRP: Acute Phase) (ESR: Chronic Phase) Lab Results  Component Value Date   CRP 5 11/17/2017   ESRSEDRATE 41 (H) 11/17/2017                         Rheumatology Markers No results found.  Renal Function Markers Lab Results  Component Value Date   BUN 10 11/17/2017   CREATININE 0.78 11/17/2017   BCR 13 11/17/2017   GFRAA 121 11/17/2017   GFRNONAA 105  11/17/2017                             Hepatic Function Markers Lab Results  Component Value Date   AST 22 11/17/2017   ALT 22 01/04/2015   ALBUMIN 4.3 11/17/2017   ALKPHOS 94 11/17/2017                        Electrolytes Lab Results  Component Value Date   NA 140 11/17/2017   K 4.2 11/17/2017   CL 98 11/17/2017   CALCIUM 9.7 11/17/2017   MG 1.9  11/17/2017                        Neuropathy Markers Lab Results  Component Value Date   VITAMINB12 342 11/17/2017                        Bone Pathology Markers Lab Results  Component Value Date   25OHVITD1 15 (L) 11/17/2017   25OHVITD2 <1.0 11/17/2017   25OHVITD3 15 11/17/2017                         Coagulation Parameters Lab Results  Component Value Date   INR 0.96 08/13/2010   LABPROT 13.0 08/13/2010   APTT 32 08/13/2010   PLT 205 01/10/2015   DDIMER 0.33 10/20/2014                        Cardiovascular Markers Lab Results  Component Value Date   HGB 16.4 01/10/2015   HCT 52.0 01/10/2015                         CA Markers No results found.  Note: Lab results reviewed.  Recent Diagnostic Imaging Results  DG Lumbar Spine Complete W/Bend CLINICAL DATA:  Low back pain  EXAM: LUMBAR SPINE - COMPLETE WITH BENDING VIEWS  COMPARISON:  None.  FINDINGS: Five lumbar type vertebral bodies are well visualized. Changes of prior fusion at L3-4 and L4-5 are noted. Infusion catheter is noted. No pars defects are seen. No anterolisthesis is noted. No soft tissue abnormality is seen. Flexion and extension views show no significant instability.  IMPRESSION: Postsurgical changes without acute abnormality.  Electronically Signed   By: Inez Catalina M.D.   On: 11/28/2017 09:43   DG Thoracic Spine 2 View CLINICAL DATA:  Chronic back pain  EXAM: THORACIC SPINE 2 VIEWS  COMPARISON:  None.  FINDINGS: Vertebral body height is well maintained. Mild osteophytic changes are noted. No paraspinal mass is seen. Infusion catheter is noted over the lower thoracic spine.  IMPRESSION: Mild degenerative change without acute abnormality.  Electronically Signed   By: Inez Catalina M.D.   On: 11/28/2017 09:42   DG HIP UNILAT W OR W/O PELVIS 2-3 VIEWS RIGHT CLINICAL DATA:  Right hip pain, no known injury, initial encounter  EXAM: DG HIP (WITH OR WITHOUT PELVIS) 2-3V  RIGHT  COMPARISON:  None.  FINDINGS: Mild degenerative changes of the right hip joint are noted. Pelvic ring is intact. Postsurgical changes in the lumbar spine are seen. No soft tissue abnormality is noted.  IMPRESSION: No acute abnormality noted.  Electronically Signed   By: Inez Catalina M.D.   On: 11/28/2017 09:32   DG Knee 1-2 Views Left CLINICAL DATA:  Chronic left knee pain  EXAM: LEFT KNEE - 1-2 VIEW  COMPARISON:  07/25/2009  FINDINGS: Left knee replacement is again noted. No acute fracture or dislocation is noted. Changes of prior ACL repair are noted. Some irregularity in the posterior aspect of the patella is noted slightly increased when compared with the prior exam. No joint effusion is noted. No soft tissue abnormality is noted.  IMPRESSION: Prior joint replacement.  Increased irregularity in the posterior aspect of the patella likely of a degenerative nature.  Electronically Signed   By: Inez Catalina M.D.   On: 11/28/2017 09:32   DG Foot Complete Right CLINICAL DATA:  Chronic foot pain common no known injury, initial encounter  EXAM: RIGHT FOOT COMPLETE - 3+ VIEW  COMPARISON:  None.  FINDINGS: There is no evidence of fracture or dislocation. There is no evidence of arthropathy or other focal bone abnormality. Soft tissues are unremarkable.  IMPRESSION: No acute abnormality noted.  Electronically Signed   By: Inez Catalina M.D.   On: 11/28/2017 09:30  Complexity Note: Imaging results reviewed. Results shared with Mr. Lindroth, using Layman's terms.                         Meds   Current Outpatient Medications:  .  baclofen (LIORESAL) 10 MG tablet, Take 1 tablet (10 mg total) by mouth 3 (three) times daily., Disp: 90 tablet, Rfl: 2 .  esomeprazole (NEXIUM) 40 MG capsule, Take 1 capsule (40 mg total) by mouth 2 (two) times daily., Disp: 60 capsule, Rfl: 2 .  Gabapentin Enacarbil (HORIZANT) 600 MG TBCR, Take 600 mg by mouth 2 (two) times  daily., Disp: 60 tablet, Rfl: 2 .  Magnesium Oxide 500 MG CAPS, Take 1 capsule (500 mg total) by mouth 2 (two) times daily at 8 am and 10 pm., Disp: 60 capsule, Rfl: 2 .  methocarbamol (ROBAXIN) 750 MG tablet, Take 1 tablet (750 mg total) by mouth every 8 (eight) hours as needed for muscle spasms., Disp: 90 tablet, Rfl: 0 .  ondansetron (ZOFRAN-ODT) 8 MG disintegrating tablet, Take 1 tablet (8 mg total) by mouth 2 (two) times daily for 15 days., Disp: 60 tablet, Rfl: 0 .  polyethylene glycol (MIRALAX / GLYCOLAX) packet, Take 17 g by mouth 2 (two) times daily., Disp: 60 each, Rfl: 2 .  PRESCRIPTION MEDICATION, morphine ambulatory pump, Disp: , Rfl:  .  promethazine (PHENERGAN) 25 MG tablet, Take 1 tablet (25 mg total) by mouth 2 (two) times daily as needed for nausea or vomiting., Disp: 30 tablet, Rfl: 2 .  cloNIDine (CATAPRES - DOSED IN MG/24 HR) 0.1 mg/24hr patch, Place 1 patch (0.1 mg total) onto the skin every 7 (seven) days for 7 days., Disp: 1 patch, Rfl: 0 .  morphine (MS CONTIN) 30 MG 12 hr tablet, Take 1 tablet (30 mg total) by mouth every 12 (twelve) hours., Disp: 60 tablet, Rfl: 0 .  [START ON 01/01/2018] morphine (MS CONTIN) 30 MG 12 hr tablet, Take 1 tablet (30 mg total) by mouth every 12 (twelve) hours., Disp: 60 tablet, Rfl: 0 .  [START ON 01/13/2018] oxyCODONE (OXY IR/ROXICODONE) 5 MG immediate release tablet, Take 1 tablet (5 mg total) by mouth daily for 7 days. Max: 1/day, Disp: 7 tablet, Rfl: 0 .  [START ON 01/06/2018] oxyCODONE (OXY IR/ROXICODONE) 5 MG immediate release tablet, Take 1 tablet (5 mg total) by mouth 2 (two) times daily for 7 days. Max: 2/day, Disp: 14 tablet, Rfl:  0 .  [START ON 12/30/2017] oxyCODONE (OXY IR/ROXICODONE) 5 MG immediate release tablet, Take 1 tablet (5 mg total) by mouth 3 (three) times daily for 7 days. Max: 3/day, Disp: 21 tablet, Rfl: 0 .  [START ON 12/23/2017] oxyCODONE (OXY IR/ROXICODONE) 5 MG immediate release tablet, Take 1 tablet (5 mg total) by mouth 4  (four) times daily for 7 days. Max: 4/day, Disp: 28 tablet, Rfl: 0 .  [START ON 12/16/2017] oxyCODONE (OXY IR/ROXICODONE) 5 MG immediate release tablet, Take 1 tablet (5 mg total) by mouth 5 (five) times daily for 7 days. Max: 5/day, Disp: 35 tablet, Rfl: 0 .  [START ON 12/09/2017] oxyCODONE (OXY IR/ROXICODONE) 5 MG immediate release tablet, Take 1 tablet (5 mg total) by mouth 6 (six) times daily for 7 days. Max: 6/day, Disp: 42 tablet, Rfl: 0 .  oxyCODONE (OXY IR/ROXICODONE) 5 MG immediate release tablet, For the next 7 days take 2 tab with breakfast, dinner, and at bedtime. Take only 1 tab at noon. Max: 7/day, Disp: 49 tablet, Rfl: 0  ROS  Constitutional: Denies any fever or chills Gastrointestinal: No reported hemesis, hematochezia, vomiting, or acute GI distress Musculoskeletal: Denies any acute onset joint swelling, redness, loss of ROM, or weakness Neurological: No reported episodes of acute onset apraxia, aphasia, dysarthria, agnosia, amnesia, paralysis, loss of coordination, or loss of consciousness  Allergies  Mr. Lapaglia is allergic to tape.  PFSH  Drug: Mr. Monarch  reports that he does not use drugs. Alcohol:  reports that he does not drink alcohol. Tobacco:  reports that he has never smoked. He has quit using smokeless tobacco. Medical:  has a past medical history of Arthritis, Asthma, Back pain, GERD (gastroesophageal reflux disease), Hernia, History of hiatal hernia, Hypogonadism in male, Neuromuscular disorder ([place]), PONV (postoperative nausea and vomiting), Shortness of breath dyspnea, Sleep apnea, Ulcer, and Wears glasses. Surgical: Mr. Mayeda  has a past surgical history that includes Back surgery; morphine pump; Tonsillectomy; Joint replacement; Shoulder arthroscopy with subacromial decompression, rotator cuff repair and bicep tendon repair (Right, 08/18/2013); Shoulder arthroscopy with subacromial decompression and bicep tendon repair (Left, 10/06/2013); and Laparoscopic gastric  sleeve resection (N/A, 01/08/2015). Family: family history includes Diabetes in his mother; Pneumonia in his father.  Constitutional Exam  General appearance: Well nourished, well developed, and well hydrated. In no apparent acute distress Vitals:   12/02/17 1104  BP: (!) 141/67  Pulse: 87  Temp: 98.8 F (37.1 C)  SpO2: 97%  Weight: (!) 340 lb (154.2 kg)  Height: '5\' 10"'$  (1.778 m)   BMI Assessment: Estimated body mass index is 48.78 kg/m as calculated from the following:   Height as of this encounter: '5\' 10"'$  (1.778 m).   Weight as of this encounter: 340 lb (154.2 kg).  BMI interpretation table: BMI level Category Range association with higher incidence of chronic pain  <18 kg/m2 Underweight   18.5-24.9 kg/m2 Ideal body weight   25-29.9 kg/m2 Overweight Increased incidence by 20%  30-34.9 kg/m2 Obese (Class I) Increased incidence by 68%  35-39.9 kg/m2 Severe obesity (Class II) Increased incidence by 136%  >40 kg/m2 Extreme obesity (Class III) Increased incidence by 254%   Patient's current BMI Ideal Body weight  Body mass index is 48.78 kg/m. Ideal body weight: 73 kg (160 lb 15 oz) Adjusted ideal body weight: 105.5 kg (232 lb 9 oz)   BMI Readings from Last 4 Encounters:  12/02/17 48.78 kg/m  11/17/17 48.78 kg/m  11/09/17 48.10 kg/m  04/11/15 51.58 kg/m   Wt  Readings from Last 4 Encounters:  12/02/17 (!) 340 lb (154.2 kg)  11/17/17 (!) 340 lb (154.2 kg)  11/09/17 (!) 340 lb (154.2 kg)  04/11/15 (!) 359 lb 8 oz (163.1 kg)  Psych/Mental status: Alert, oriented x 3 (person, place, & time)       Eyes: PERLA Respiratory: No evidence of acute respiratory distress  Cervical Spine Area Exam  Skin & Axial Inspection: No masses, redness, edema, swelling, or associated skin lesions Alignment: Symmetrical Functional ROM: Unrestricted ROM      Stability: No instability detected Muscle Tone/Strength: Functionally intact. No obvious neuro-muscular anomalies detected. Sensory  (Neurological): Unimpaired Palpation: No palpable anomalies              Upper Extremity (UE) Exam    Side: Right upper extremity  Side: Left upper extremity  Skin & Extremity Inspection: Skin color, temperature, and hair growth are WNL. No peripheral edema or cyanosis. No masses, redness, swelling, asymmetry, or associated skin lesions. No contractures.  Skin & Extremity Inspection: Skin color, temperature, and hair growth are WNL. No peripheral edema or cyanosis. No masses, redness, swelling, asymmetry, or associated skin lesions. No contractures.  Functional ROM: Unrestricted ROM          Functional ROM: Unrestricted ROM          Muscle Tone/Strength: Functionally intact. No obvious neuro-muscular anomalies detected.  Muscle Tone/Strength: Functionally intact. No obvious neuro-muscular anomalies detected.  Sensory (Neurological): Unimpaired          Sensory (Neurological): Unimpaired          Palpation: No palpable anomalies              Palpation: No palpable anomalies              Provocative Test(s):  Phalen's test: deferred Tinel's test: deferred Apley's scratch test (touch opposite shoulder):  Action 1 (Across chest): deferred Action 2 (Overhead): deferred Action 3 (LB reach): deferred   Provocative Test(s):  Phalen's test: deferred Tinel's test: deferred Apley's scratch test (touch opposite shoulder):  Action 1 (Across chest): deferred Action 2 (Overhead): deferred Action 3 (LB reach): deferred    Thoracic Spine Area Exam  Skin & Axial Inspection: No masses, redness, or swelling Alignment: Symmetrical Functional ROM: Unrestricted ROM Stability: No instability detected Muscle Tone/Strength: Functionally intact. No obvious neuro-muscular anomalies detected. Sensory (Neurological): Unimpaired Muscle strength & Tone: No palpable anomalies  Lumbar Spine Area Exam  Skin & Axial Inspection: Well healed scar from previous spine surgery detected Alignment: Symmetrical Functional  ROM: Minimal ROM       Stability: No instability detected Muscle Tone/Strength: Increased muscle tone over affected area Sensory (Neurological): Movement-associated pain Palpation: Complains of area being tender to palpation       Provocative Tests: Hyperextension/rotation test: (+) bilaterally for facet joint pain. Lumbar quadrant test (Kemp's test): (+) bilaterally for facet joint pain. Lateral bending test: deferred today       Patrick's Maneuver: deferred today                   FABER test: deferred today                   S-I anterior distraction/compression test: deferred today         S-I lateral compression test: deferred today         S-I Thigh-thrust test: deferred today         S-I Gaenslen's test: deferred today  Gait & Posture Assessment  Ambulation: Patient ambulates using a walker Gait: Significantly limited. Dependent on assistive device to ambulate Posture: Difficulty standing up straight, due to pain   Lower Extremity Exam    Side: Right lower extremity  Side: Left lower extremity  Stability: No instability observed          Stability: No instability observed          Skin & Extremity Inspection: Evidence of prior arthroplastic surgery  Skin & Extremity Inspection: Evidence of prior arthroplastic surgery  Functional ROM: Unrestricted ROM for hip and knee joints          Functional ROM: Decreased ROM for hip and knee joints          Muscle Tone/Strength: Deconditioned  Muscle Tone/Strength: Deconditioned  Sensory (Neurological): Unimpaired  Sensory (Neurological): Unimpaired  Palpation: No palpable anomalies  Palpation: No palpable anomalies   Assessment  Primary Diagnosis & Pertinent Problem List: The primary encounter diagnosis was Chronic low back pain (Primary Area of Pain) (Bilateral) (R>L). Diagnoses of Spondylosis without myelopathy or radiculopathy, lumbosacral region, Lumbar facet syndrome (Bilateral) (R>L), Chronic pain syndrome, Long term  prescription opiate use, Pharmacologic therapy, Chronic musculoskeletal pain, Chronic nausea, and Drug-induced nausea and vomiting were also pertinent to this visit.  Status Diagnosis  Worsening Stable Unimproved 1. Chronic low back pain (Primary Area of Pain) (Bilateral) (R>L)   2. Spondylosis without myelopathy or radiculopathy, lumbosacral region   3. Lumbar facet syndrome (Bilateral) (R>L)   4. Chronic pain syndrome   5. Long term prescription opiate use   6. Pharmacologic therapy   7. Chronic musculoskeletal pain   8. Chronic nausea   9. Drug-induced nausea and vomiting     Problems updated and reviewed during this visit: No problems updated. Plan of Care  Pharmacotherapy (Medications Ordered): Meds ordered this encounter  Medications  . morphine (MS CONTIN) 30 MG 12 hr tablet    Sig: Take 1 tablet (30 mg total) by mouth every 12 (twelve) hours.    Dispense:  60 tablet    Refill:  0    Medication for Chronic Pain (G89.4). [place] STOP ACT - Not applicable. Fill one day early if pharmacy is closed on scheduled refill date.  Do not fill until: 12/02/17 To last until: 01/01/18  . oxyCODONE (OXY IR/ROXICODONE) 5 MG immediate release tablet    Sig: Take 1 tablet (5 mg total) by mouth daily for 7 days. Max: 1/day    Dispense:  7 tablet    Refill:  0    This prescription is part of a downward opioid taper. Fill prescriptions in the correct order. Fill instructions must be followed exactly as written to avoid withdrawal. Fill date: 01/13/18 To last until: 01/20/18  . oxyCODONE (OXY IR/ROXICODONE) 5 MG immediate release tablet    Sig: Take 1 tablet (5 mg total) by mouth 2 (two) times daily for 7 days. Max: 2/day    Dispense:  14 tablet    Refill:  0    This prescription is part of a downward opioid taper. Fill prescriptions in the correct order. Fill instructions must be followed exactly as written to avoid withdrawal. Fill date: 01/06/18 To last until: 01/13/18  . oxyCODONE (OXY  IR/ROXICODONE) 5 MG immediate release tablet    Sig: Take 1 tablet (5 mg total) by mouth 3 (three) times daily for 7 days. Max: 3/day    Dispense:  21 tablet    Refill:  0    This  prescription is part of a downward opioid taper. Fill prescriptions in the correct order. Fill instructions must be followed exactly as written to avoid withdrawal. Fill date: 12/30/17 To last until: 01/06/18  . oxyCODONE (OXY IR/ROXICODONE) 5 MG immediate release tablet    Sig: Take 1 tablet (5 mg total) by mouth 4 (four) times daily for 7 days. Max: 4/day    Dispense:  28 tablet    Refill:  0    This prescription is part of a downward opioid taper. Fill prescriptions in the correct order. Fill instructions must be followed exactly as written to avoid withdrawal. Fill date: 12/23/17 To last until: 12/30/17  . oxyCODONE (OXY IR/ROXICODONE) 5 MG immediate release tablet    Sig: Take 1 tablet (5 mg total) by mouth 5 (five) times daily for 7 days. Max: 5/day    Dispense:  35 tablet    Refill:  0    This prescription is part of a downward opioid taper. Fill prescriptions in the correct order. Fill instructions must be followed exactly as written to avoid withdrawal. Fill date: 12/16/17 To last until: 12/23/17  . oxyCODONE (OXY IR/ROXICODONE) 5 MG immediate release tablet    Sig: Take 1 tablet (5 mg total) by mouth 6 (six) times daily for 7 days. Max: 6/day    Dispense:  42 tablet    Refill:  0    This prescription is part of a downward opioid taper. Fill prescriptions in the correct order. Fill instructions must be followed exactly as written to avoid withdrawal. Fill date: 12/09/17 To last until: 12/16/17  . oxyCODONE (OXY IR/ROXICODONE) 5 MG immediate release tablet    Sig: For the next 7 days take 2 tab with breakfast, dinner, and at bedtime. Take only 1 tab at noon. Max: 7/day    Dispense:  49 tablet    Refill:  0    This prescription is part of a downward opioid taper. Fill prescriptions in the correct order.  Fill instructions must be followed exactly as written to avoid withdrawal. Fill date: 12/02/17 To last until: 12/09/17  . morphine (MS CONTIN) 30 MG 12 hr tablet    Sig: Take 1 tablet (30 mg total) by mouth every 12 (twelve) hours.    Dispense:  60 tablet    Refill:  0    Medication for Chronic Pain (G89.4). [place] STOP ACT - Not applicable. Fill one day early if pharmacy is closed on scheduled refill date.  Do not fill until: 01/01/18 To last until: 01/31/18  . methocarbamol (ROBAXIN) 750 MG tablet    Sig: Take 1 tablet (750 mg total) by mouth every 8 (eight) hours as needed for muscle spasms.    Dispense:  90 tablet    Refill:  0    Do not place this medication, or any other prescription from our practice, on "Automatic Refill". Patient may have prescription filled one day early if pharmacy is closed on scheduled refill date.   Medications administered today: Jeneen Rinks B. Burkman had no medications administered during this visit.   Procedure Orders     LUMBAR FACET(MEDIAL BRANCH NERVE BLOCK) MBNB  Lab Orders     ToxASSURE Select 13 (MW), Urine Imaging Orders  No imaging studies ordered today   Referral Orders  No referral(s) requested today    Interventional management options: Planned, scheduled, and/or pending:   Diagnostic bilateral lumbar facet block #1 under fluoroscopic guidance and IV sedation  Will plan on adding preservative free bupivacaine to his  intrathecal pump and also decrease the concentration on the morphine since it is above recommended levels. Downward tapering of opioids for the purpose of accomplishing a "Drug Holiday".  today the patient will be given enough medication to last until 01/31/2018, at which time we will see him back to continue tapering the opioids down.    Considering:   Palliative intrathecal pump refill and maintenance  Possible addition of local anesthetics to intrathecal pump  Diagnostic left Genicular nerve block  Possible left Genicular  nerve RFA  Diagnostic bilateral lumbar facet block  Possible bilateral lumbar facet RFA  Diagnostic caudal epidural steroid injection + epidurogram  Possible Racz procedure  Diagnostic bilateral intra-articular shoulder joint injection  Diagnostic bilateral suprascapular nerve block  Possible bilateral suprascapular nerve RFA  Diagnostic interlaminar ESI versus transforaminal ESI    Palliative PRN treatment(s):   None at this time   Provider-requested follow-up: Return for Procedure (w/ sedation): (B) L-FCT BLK #1.  Future Appointments  Date Time Provider [place]  01/26/2018 11:00 AM Milinda Pointer, MD [facility] None   Primary Care Physician: Shirline Frees, MD Location: [facility] Note by: Gaspar Cola, MD Date: 12/02/2017; Time: 12:26 PM

## 2017-12-02 ENCOUNTER — Other Ambulatory Visit: Payer: Self-pay

## 2017-12-02 ENCOUNTER — Encounter: Payer: Self-pay | Admitting: Pain Medicine

## 2017-12-02 ENCOUNTER — Ambulatory Visit: Payer: No Typology Code available for payment source | Attending: Pain Medicine | Admitting: Pain Medicine

## 2017-12-02 VITALS — BP 141/67 | HR 87 | Temp 98.8°F | Ht 70.0 in | Wt 340.0 lb

## 2017-12-02 DIAGNOSIS — M25551 Pain in right hip: Secondary | ICD-10-CM | POA: Diagnosis not present

## 2017-12-02 DIAGNOSIS — T50905A Adverse effect of unspecified drugs, medicaments and biological substances, initial encounter: Secondary | ICD-10-CM

## 2017-12-02 DIAGNOSIS — Z79891 Long term (current) use of opiate analgesic: Secondary | ICD-10-CM | POA: Diagnosis not present

## 2017-12-02 DIAGNOSIS — M545 Low back pain, unspecified: Secondary | ICD-10-CM

## 2017-12-02 DIAGNOSIS — Z9884 Bariatric surgery status: Secondary | ICD-10-CM | POA: Diagnosis not present

## 2017-12-02 DIAGNOSIS — G4733 Obstructive sleep apnea (adult) (pediatric): Secondary | ICD-10-CM | POA: Diagnosis not present

## 2017-12-02 DIAGNOSIS — M7918 Myalgia, other site: Secondary | ICD-10-CM | POA: Insufficient documentation

## 2017-12-02 DIAGNOSIS — Z96652 Presence of left artificial knee joint: Secondary | ICD-10-CM | POA: Insufficient documentation

## 2017-12-02 DIAGNOSIS — Z79899 Other long term (current) drug therapy: Secondary | ICD-10-CM | POA: Diagnosis not present

## 2017-12-02 DIAGNOSIS — M25562 Pain in left knee: Secondary | ICD-10-CM | POA: Insufficient documentation

## 2017-12-02 DIAGNOSIS — M5441 Lumbago with sciatica, right side: Secondary | ICD-10-CM | POA: Insufficient documentation

## 2017-12-02 DIAGNOSIS — M5137 Other intervertebral disc degeneration, lumbosacral region: Secondary | ICD-10-CM | POA: Diagnosis not present

## 2017-12-02 DIAGNOSIS — R112 Nausea with vomiting, unspecified: Secondary | ICD-10-CM | POA: Diagnosis not present

## 2017-12-02 DIAGNOSIS — G8929 Other chronic pain: Secondary | ICD-10-CM | POA: Diagnosis present

## 2017-12-02 DIAGNOSIS — G894 Chronic pain syndrome: Secondary | ICD-10-CM

## 2017-12-02 DIAGNOSIS — M47816 Spondylosis without myelopathy or radiculopathy, lumbar region: Secondary | ICD-10-CM | POA: Diagnosis not present

## 2017-12-02 DIAGNOSIS — M79604 Pain in right leg: Secondary | ICD-10-CM | POA: Insufficient documentation

## 2017-12-02 DIAGNOSIS — M25511 Pain in right shoulder: Secondary | ICD-10-CM | POA: Insufficient documentation

## 2017-12-02 DIAGNOSIS — Z6841 Body Mass Index (BMI) 40.0 and over, adult: Secondary | ICD-10-CM | POA: Insufficient documentation

## 2017-12-02 DIAGNOSIS — K5903 Drug induced constipation: Secondary | ICD-10-CM | POA: Diagnosis not present

## 2017-12-02 DIAGNOSIS — R110 Nausea: Secondary | ICD-10-CM

## 2017-12-02 DIAGNOSIS — M23204 Derangement of unspecified medial meniscus due to old tear or injury, left knee: Secondary | ICD-10-CM | POA: Diagnosis not present

## 2017-12-02 DIAGNOSIS — M5442 Lumbago with sciatica, left side: Secondary | ICD-10-CM | POA: Diagnosis not present

## 2017-12-02 DIAGNOSIS — K219 Gastro-esophageal reflux disease without esophagitis: Secondary | ICD-10-CM | POA: Diagnosis not present

## 2017-12-02 DIAGNOSIS — M79605 Pain in left leg: Secondary | ICD-10-CM | POA: Diagnosis not present

## 2017-12-02 DIAGNOSIS — M25512 Pain in left shoulder: Secondary | ICD-10-CM | POA: Diagnosis not present

## 2017-12-02 DIAGNOSIS — M47817 Spondylosis without myelopathy or radiculopathy, lumbosacral region: Secondary | ICD-10-CM

## 2017-12-02 MED ORDER — OXYCODONE HCL 5 MG PO TABS: 5.0000 mg | ORAL_TABLET | Freq: Every day | ORAL | 0 refills | Status: DC

## 2017-12-02 MED ORDER — MORPHINE SULFATE ER 30 MG PO TBCR: 30.0000 mg | EXTENDED_RELEASE_TABLET | Freq: Two times a day (BID) | ORAL | 0 refills | Status: DC

## 2017-12-02 MED ORDER — OXYCODONE HCL 5 MG PO TABS
5.0000 mg | ORAL_TABLET | Freq: Every day | ORAL | 0 refills | Status: DC
Start: 2018-01-13 — End: 2018-01-25

## 2017-12-02 MED ORDER — OXYCODONE HCL 5 MG PO TABS: 5.0000 mg | ORAL_TABLET | Freq: Two times a day (BID) | ORAL | 0 refills | Status: DC

## 2017-12-02 MED ORDER — OXYCODONE HCL 5 MG PO TABS: 5.0000 mg | ORAL_TABLET | Freq: Four times a day (QID) | ORAL | 0 refills | Status: DC

## 2017-12-02 MED ORDER — METHOCARBAMOL 750 MG PO TABS: 750.0000 mg | ORAL_TABLET | Freq: Three times a day (TID) | ORAL | 0 refills | Status: DC | PRN

## 2017-12-02 MED ORDER — OXYCODONE HCL 5 MG PO TABS: ORAL_TABLET | ORAL | 0 refills | Status: DC

## 2017-12-02 MED ORDER — OXYCODONE HCL 5 MG PO TABS: 5.0000 mg | ORAL_TABLET | Freq: Three times a day (TID) | ORAL | 0 refills | Status: DC

## 2017-12-02 NOTE — Patient Instructions (Addendum)
____You have been given 2 scripts for MS Contin today and you have been given 7 scripts for Oxycodone taper today.  You have been given pre procedure instructions and you have been sent for a UDS.   ________________________________________________________________________________________  Preparing for Procedure with Sedation  Instructions: . Oral Intake: Do not eat or drink anything for at least 8 hours prior to your procedure. . Transportation: Public transportation is not allowed. Bring an adult driver. The driver must be physically present in our waiting room before any procedure can be started. Marland Kitchen. Physical Assistance: Bring an adult physically capable of assisting you, in the event you need help. This adult should keep you company at home for at least 6 hours after the procedure. . Blood Pressure Medicine: Take your blood pressure medicine with a sip of water the morning of the procedure. . Blood thinners: Notify our staff if you are taking any blood thinners. Depending on which one you take, there will be specific instructions on how and when to stop it. . Diabetics on insulin: Notify the staff so that you can be scheduled 1st case in the morning. If your diabetes requires high dose insulin, take only  of your normal insulin dose the morning of the procedure and notify the staff that you have done so. . Preventing infections: Shower with an antibacterial soap the morning of your procedure. . Build-up your immune system: Take 1000 mg of Vitamin C with every meal (3 times a day) the day prior to your procedure. Marland Kitchen. Antibiotics: Inform the staff if you have a condition or reason that requires you to take antibiotics before dental procedures. . Pregnancy: If you are pregnant, call and cancel the procedure. . Sickness: If you have a cold, fever, or any active infections, call and cancel the procedure. . Arrival: You must be in the facility at least 30 minutes prior to your scheduled  procedure. . Children: Do not bring children with you. . Dress appropriately: Bring dark clothing that you would not mind if they get stained. . Valuables: Do not bring any jewelry or valuables.  Procedure appointments are reserved for interventional treatments only. Marland Kitchen. No Prescription Refills. . No medication changes will be discussed during procedure appointments. . No disability issues will be discussed.  Reasons to call and reschedule or cancel your procedure: (Following these recommendations will minimize the risk of a serious complication.) . Surgeries: Avoid having procedures within 2 weeks of any surgery. (Avoid for 2 weeks before or after any surgery). . Flu Shots: Avoid having procedures within 2 weeks of a flu shots or . (Avoid for 2 weeks before or after immunizations). . Barium: Avoid having a procedure within 7-10 days after having had a radiological study involving the use of radiological contrast. (Myelograms, Barium swallow or enema study). . Heart attacks: Avoid any elective procedures or surgeries for the initial 6 months after a "Myocardial Infarction" (Heart Attack). . Blood thinners: It is imperative that you stop these medications before procedures. Let us know if you if you take any blood thinner.  . Infection: Avoid procedures during or within two weeks of an infection (including chest colds or gastrointestinal problems). Symptoms associated with infections include: Localized redness, fever, chills, night sweats or profuse sweating, burning sensation when voiding, cough, congestion, stuffiness, runny nose, sore throat, diarrhea, nausea, vomiting, cold or Flu symptoms, recent or current infections. It is specially important if the infection is over the area that we intend to treat. Marland Kitchen. Heart and lung problems:  Symptoms that may suggest an active cardiopulmonary problem include: cough, chest pain, breathing difficulties or shortness of breath, dizziness, ankle swelling,  uncontrolled high or unusually low blood pressure, and/or palpitations. If you are experiencing any of these symptoms, cancel your procedure and contact your primary care physician for an evaluation.  Remember:  Regular Business hours are:  Monday to Thursday 8:00 AM to 4:00 PM  Provider's Schedule: Delano MetzFrancisco Eesha Schmaltz, MD:  Procedure days: Tuesday and Thursday 7:30 AM to 4:00 PM  Edward JollyBilal Lateef, MD:  Procedure days: Monday and Wednesday 7:30 AM to 4:00 PM ____________________________________________________________________________________________   Preparing for Procedure with Sedation Instructions: . Oral Intake: Do not eat or drink anything for at least 8 hours prior to your procedure. . Transportation: Public transportation is not allowed. Bring an adult driver. The driver must be physically present in our waiting room before any procedure can be started. Marland Kitchen. Physical Assistance: Bring an adult capable of physically assisting you, in the event you need help. . Blood Pressure Medicine: Take your blood pressure medicine with a sip of water the morning of the procedure. . Insulin: Take only  of your normal insulin dose. . Preventing infections: Shower with an antibacterial soap the morning of your procedure. . Build-up your immune system: Take 1000 mg of Vitamin C with every meal (3 times a day) the day prior to your procedure. . Pregnancy: If you are pregnant, call and cancel the procedure. . Sickness: If you have a cold, fever, or any active infections, call and cancel the procedure. . Arrival: You must be in the facility at least 30 minutes prior to your scheduled procedure. . Children: Do not bring children with you. . Dress appropriately: Bring dark clothing that you would not mind if they get stained. . Valuables: Do not bring any jewelry or valuables. Procedure appointments are reserved for interventional treatments only. Marland Kitchen. No Prescription Refills. . No medication changes will be  discussed during procedure appointments. No disability issues will be discussed.Facet Blocks Patient Information  Description: The facets are joints in the spine between the vertebrae.  Like any joints in the body, facets can become irritated and painful.  Arthritis can also effect the facets.  By injecting steroids and local anesthetic in and around these joints, we can temporarily block the nerve supply to them.  Steroids act directly on irritated nerves and tissues to reduce selling and inflammation which often leads to decreased pain.  Facet blocks may be done anywhere along the spine from the neck to the low back depending upon the location of your pain.   After numbing the skin with local anesthetic (like Novocaine), a small needle is passed onto the facet joints under x-ray guidance.  You may experience a sensation of pressure while this is being done.  The entire block usually lasts about 15-25 minutes.   Conditions which may be treated by facet blocks:  Low back/buttock pain Neck/shoulder pain Certain types of headaches  Preparation for the injection:  Do not eat any solid food or dairy products within 8 hours of your appointment. You may drink clear liquid up to 3 hours before appointment.  Clear liquids include water, black coffee, juice or soda.  No milk or cream please. You may take your regular medication, including pain medications, with a sip of water before your appointment.  Diabetics should hold regular insulin (if taken separately) and take 1/2 normal NPH dose the morning of the procedure.  Carry some sugar containing items with you  to your appointment. A driver must accompany you and be prepared to drive you home after your procedure. Bring all your current medications with you. An IV may be inserted and sedation may be given at the discretion of the physician. A blood pressure cuff, EKG and other monitors will often be applied during the procedure.  Some patients may need  to have extra oxygen administered for a short period. You will be asked to provide medical information, including your allergies and medications, prior to the procedure.  We must know immediately if you are taking blood thinners (like Coumadin/Warfarin) or if you are allergic to IV iodine contrast (dye).  We must know if you could possible be pregnant.  Possible side-effects:  Bleeding from needle site Infection (rare, may require surgery) Nerve injury (rare) Numbness & tingling (temporary) Difficulty urinating (rare, temporary) Spinal headache (a headache worse with upright posture) Light-headedness (temporary) Pain at injection site (serveral days) Decreased blood pressure (rare, temporary) Weakness in arm/leg (temporary) Pressure sensation in back/neck (temporary)   Call if you experience:  Fever/chills associated with headache or increased back/neck pain Headache worsened by an upright position New onset, weakness or numbness of an extremity below the injection site Hives or difficulty breathing (go to the emergency room) Inflammation or drainage at the injection site(s) Severe back/neck pain greater than usual New symptoms which are concerning to you  Please note:  Although the local anesthetic injected can often make your back or neck feel good for several hours after the injection, the pain will likely return. It takes 3-7 days for steroids to work.  You may not notice any pain relief for at least one week.  If effective, we will often do a series of 2-3 injections spaced 3-6 weeks apart to maximally decrease your pain.  After the initial series, you may be a candidate for a more permanent nerve block of the facets.  If you have any questions, please call #[phone] . [facility]

## 2017-12-02 NOTE — Progress Notes (Signed)
Nursing Pain Medication Assessment:  Safety precautions to be maintained throughout the outpatient stay will include: orient to surroundings, keep bed in low position, maintain call bell within reach at all times, provide assistance with transfer out of bed and ambulation.  Medication Inspection Compliance: Pill count conducted under aseptic conditions, in front of the patient. Neither the pills nor the bottle was removed from the patient's sight at any time. Once count was completed pills were immediately returned to the patient in their original bottle.  Medication #1: Methadone Pill/Patch Count: 1 of 60 pills remain Pill/Patch Appearance: Markings consistent with prescribed medication Bottle Appearance: Standard pharmacy container. Clearly labeled. Filled Date: 7/9/ 2019 Last Medication intake:  Day before yesterday  Medication #2: Morphine IR Pill/Patch Count: 1 of 28 pills remain Pill/Patch Appearance: Markings consistent with prescribed medication Bottle Appearance: Standard pharmacy container. Clearly labeled. Filled Date: 8 / 7 / 2018 Last Medication intake:  Yesterday

## 2017-12-09 LAB — TOXASSURE SELECT 13 (MW), URINE

## 2017-12-10 ENCOUNTER — Emergency Department
Admission: EM | Admit: 2017-12-10 | Discharge: 2017-12-10 | Disposition: A | Discharge status: Home / Self Care | Payer: No Typology Code available for payment source | Attending: Emergency Medicine | Admitting: Emergency Medicine

## 2017-12-10 ENCOUNTER — Other Ambulatory Visit: Payer: Self-pay

## 2017-12-10 ENCOUNTER — Emergency Department: Payer: No Typology Code available for payment source

## 2017-12-10 ENCOUNTER — Telehealth: Payer: Self-pay

## 2017-12-10 ENCOUNTER — Encounter: Payer: Self-pay | Admitting: Emergency Medicine

## 2017-12-10 DIAGNOSIS — J45909 Unspecified asthma, uncomplicated: Secondary | ICD-10-CM | POA: Diagnosis not present

## 2017-12-10 DIAGNOSIS — S50311A Abrasion of right elbow, initial encounter: Secondary | ICD-10-CM | POA: Insufficient documentation

## 2017-12-10 DIAGNOSIS — Y92009 Unspecified place in unspecified non-institutional (private) residence as the place of occurrence of the external cause: Secondary | ICD-10-CM | POA: Diagnosis not present

## 2017-12-10 DIAGNOSIS — Z79899 Other long term (current) drug therapy: Secondary | ICD-10-CM | POA: Insufficient documentation

## 2017-12-10 DIAGNOSIS — S59901A Unspecified injury of right elbow, initial encounter: Secondary | ICD-10-CM | POA: Diagnosis present

## 2017-12-10 DIAGNOSIS — W19XXXA Unspecified fall, initial encounter: Secondary | ICD-10-CM

## 2017-12-10 DIAGNOSIS — Y998 Other external cause status: Secondary | ICD-10-CM | POA: Insufficient documentation

## 2017-12-10 DIAGNOSIS — W010XXA Fall on same level from slipping, tripping and stumbling without subsequent striking against object, initial encounter: Secondary | ICD-10-CM | POA: Diagnosis not present

## 2017-12-10 DIAGNOSIS — Y939 Activity, unspecified: Secondary | ICD-10-CM | POA: Diagnosis not present

## 2017-12-10 DIAGNOSIS — S50312A Abrasion of left elbow, initial encounter: Secondary | ICD-10-CM | POA: Diagnosis not present

## 2017-12-10 LAB — BASIC METABOLIC PANEL
ANION GAP: 13 (ref 5–15)
BUN: 12 mg/dL (ref 6–20)
CHLORIDE: 101 mmol/L (ref 98–111)
CO2: 25 mmol/L (ref 22–32)
Calcium: 8.3 mg/dL — ABNORMAL LOW (ref 8.9–10.3)
Creatinine, Ser: 0.66 mg/dL (ref 0.61–1.24)
GFR calc Af Amer: >60 mL/min (ref >60)
Glucose, Bld: 111 mg/dL — ABNORMAL HIGH (ref 70–99)
POTASSIUM: 4.3 mmol/L (ref 3.5–5.1)
SODIUM: 139 mmol/L (ref 135–145)

## 2017-12-10 LAB — CBC
HCT: 41.9 % (ref 40.0–52.0)
HEMOGLOBIN: 14.3 g/dL (ref 13.0–18.0)
MCH: 32.2 pg (ref 26.0–34.0)
MCHC: 34.1 g/dL (ref 32.0–36.0)
MCV: 94.3 fL (ref 80.0–100.0)
PLATELETS: 245 10*3/uL (ref 150–440)
RBC: 4.45 MIL/uL (ref 4.40–5.90)
RDW: 15.5 % — ABNORMAL HIGH (ref 11.5–14.5)
WBC: 6.3 10*3/uL (ref 3.8–10.6)

## 2017-12-10 MED ORDER — KETOROLAC TROMETHAMINE 30 MG/ML IJ SOLN
60.0000 mg | Freq: Once | INTRAMUSCULAR | Status: AC
  Administered 2017-12-10: 60 mg via INTRAMUSCULAR
  Filled 2017-12-10: qty 2

## 2017-12-10 MED ORDER — KETOROLAC TROMETHAMINE 30 MG/ML IJ SOLN
INTRAMUSCULAR | Status: AC
  Filled 2017-12-10: qty 1

## 2017-12-10 NOTE — Telephone Encounter (Signed)
Attempted to call the number back and it is not the correct number.

## 2017-12-10 NOTE — ED Triage Notes (Signed)
Pt in via [facility] with c/o slipping out of the bed and increasing his pain. EMS reports pt slipped out of the bed and was on the floor for 6 hours. EMS reports that pt c/o pain to bilateral knee pain and right elbow pain. EMS reports pt with hx of chronic back pain and knee surgery. EMS reports that per pt he took 30mg  of morphine and 10mg  oxycodone which pt reports is a low dose.    184/102

## 2017-12-10 NOTE — Discharge Instructions (Addendum)

## 2017-12-10 NOTE — ED Notes (Signed)
Blood draw left ac and sent to lab.  Warm blankets given.  Family at bedside.

## 2017-12-10 NOTE — Telephone Encounter (Signed)
Pt's wife called and states Fayrene FearingJames is in the ER in a lot of pain, he has fallen and they have done X-Rays , they also gave him toradol , His B/P and respirations are Low. Pt is currently in the ER. You can call on her cell [phone] if needed.

## 2017-12-10 NOTE — ED Notes (Signed)
Pt requesting to cancel PT consult and go home. MD made aware. Per MD if pt can ambulate with use of walker with steady gait pt can go home. Pt made aware. Walker to bedside. Pt ambulated with steady gait with use of walker.

## 2017-12-10 NOTE — Telephone Encounter (Signed)
Spoke with patient wife.  States they are at home now. Xrays were normal.  Informed her to call and make another appointment if needed.

## 2017-12-10 NOTE — ED Provider Notes (Signed)
[facility] Emergency Department Provider Note  ____________________________________________  Time seen: Approximately 9:42 AM  I have reviewed the triage vital signs and the nursing notes.   HISTORY  Chief Complaint Fall; Knee Injury; Knee Pain; and Back Pain   HPI [name] is a 52 y.o. male with history of chronic pain on narcotics for 25 years who presents for evaluation of several falls.  Patient reports that he had to change primary care doctors recently.  His PCP does not feel comfortable with the amount of narcotics that he is on and has been weaning him off of it for the last 2 to 3 months.  Patient reports that his pain has been uncontrolled at home and due to the severity of the pain he has several daily falls.  He had 3 falls today and after the third one his wife could not get him up from the floor so 911 was called.  He walks with a walker. Patient is complaining of pain on his right elbow and left knee which are new from this fall this morning.  Is also complaining of diffuse back pain, leg pain, arm pain which he has chronically and unchanged.  Patient received a 30 mg IR morphine and 20 mg oxycodone before leaving the house.  Patient also has a morphine pump.  Past Medical History:  Diagnosis Date  . Arthritis   . Asthma    as child  . Back pain   . GERD (gastroesophageal reflux disease)   . Hernia   . History of hiatal hernia   . Hypogonadism in male   . Neuromuscular disorder (HCC)    back injury with surgery  . PONV (postoperative nausea and vomiting)    gets nausea  . Shortness of breath dyspnea   . Sleep apnea    Does not use CPAP, new equipment 12/2014  . Ulcer   . Wears glasses     Patient Active Problem List   Diagnosis Date Noted  . Spondylosis without myelopathy or radiculopathy, lumbosacral region 12/02/2017  . Lumbar facet syndrome (Bilateral) (R>L) 12/02/2017  . Chronic low back pain (Primary Area of Pain)  (Bilateral) (R>L) 12/02/2017  . Chronic knee pain after total replacement (Left) 12/01/2017  . Chronic musculoskeletal pain 11/17/2017  . Muscle spasticity 11/17/2017  . Gastroesophageal reflux disease without esophagitis 11/17/2017  . Neurogenic pain 11/17/2017  . Drug-induced nausea and vomiting 11/17/2017  . Therapeutic opioid-induced constipation (OIC) 11/17/2017  . Chronic pain syndrome 11/09/2017  . Long term prescription benzodiazepine use 11/09/2017  . Long term prescription opiate use 11/09/2017  . Opiate use (> 150 MME/day) 11/09/2017  . Long-term use of high-risk medication 11/09/2017  . Pharmacologic therapy 11/09/2017  . Disorder of skeletal system 11/09/2017  . Problems influencing health status 11/09/2017  . Chronic nausea 11/09/2017  . Presence of intrathecal pump 11/09/2017  . Encounter for interrogation of infusion pump 11/09/2017  . Chronic foot pain (Right) 11/09/2017  . Chronic hip pain (Right) 11/09/2017  . Old tear of medial meniscus of knee (Left) 11/09/2017  . Failed back surgical syndrome 11/09/2017  . Chronic low back pain (Bilateral) w/ sciatica (Bilateral) 11/09/2017  . Chronic lower extremity pain (Bilateral) (L>R) 11/09/2017  . Chronic lower extremity radicular pain 11/09/2017  . Chronic shoulder pain (Bilateral) 11/09/2017  . Hx of total knee replacement (Left) 11/09/2017  . History of arthroscopic surgery of shoulder (Bilateral) 11/09/2017  . S/P laparoscopic sleeve gastrectomy Sept 2016 01/08/2015  . Severe  obesity (BMI >= 40) (HCC) 10/21/2014  . OSA complicated by mild polycythemia  10/21/2014  . Polycythemia, secondary 10/21/2014  . Dyspnea 10/20/2014  . S/P rotator cuff repair (Bilateral) 08/18/2013  . Chronic knee pain (Left) 02/28/2008  . DDD (degenerative disc disease), lumbosacral 10/20/2006    Past Surgical History:  Procedure Laterality Date  . BACK SURGERY    . JOINT REPLACEMENT     Left knee replacement  . LAPAROSCOPIC GASTRIC  SLEEVE RESECTION N/A 01/08/2015   Procedure: LAPAROSCOPIC GASTRIC SLEEVE RESECTION;  Surgeon: Luretha Murphy, MD;  Location: [facility];  Service: General;  Laterality: N/A;  . morphine pump    . SHOULDER ARTHROSCOPY WITH SUBACROMIAL DECOMPRESSION AND BICEP TENDON REPAIR Left 10/06/2013   Procedure: LEFT SHOULDER ARTHROSCOPY DEBRIDEMENT EXTENTSIVE,DISTAL CLAVICULECTOMY,DECOMPRESSION SUBACROMIAL PARTIAL ACROMIOPLASTY WITH ROTATOR CUFF REPAIR, and excision of CALCIUM DEPOSIT;  Surgeon: Loreta Ave, MD;  Location: [facility];  Service: Orthopedics;  Laterality: Left;  . SHOULDER ARTHROSCOPY WITH SUBACROMIAL DECOMPRESSION, ROTATOR CUFF REPAIR AND BICEP TENDON REPAIR Right 08/18/2013   Procedure: RIGHT SHOULDER ARTHROSCOPY WITH SUBACROMIAL DECOMPRESSION/PARTIAL ACROMIOPLASTY WITH CORACOACROMIAL RELEASE/DISTAL CLAVICULECTOMY/ ROTATOR CUFF REPAIR/DEBRIDEMENT EXTENTSIVE;  Surgeon: Loreta Ave, MD;  Location: [facility];  Service: Orthopedics;  Laterality: Right;  ANESTHESIA: GENERAL, PRE/POST OP SCALENE  . TONSILLECTOMY      Prior to Admission medications   Medication Sig Start Date End Date Taking? Authorizing Provider  baclofen (LIORESAL) 10 MG tablet Take 1 tablet (10 mg total) by mouth 3 (three) times daily. 11/17/17 02/15/18 Yes [name], MD  cloNIDine (CATAPRES - DOSED IN MG/24 HR) 0.1 mg/24hr patch Place 1 patch (0.1 mg total) onto the skin every 7 (seven) days for 7 days. 11/23/17 12/10/17 Yes Barbette Merino, NP  esomeprazole (NEXIUM) 40 MG capsule Take 1 capsule (40 mg total) by mouth 2 (two) times daily. 11/17/17 02/15/18 Yes [name], MD  Gabapentin Enacarbil (HORIZANT) 600 MG TBCR Take 600 mg by mouth 2 (two) times daily. 11/17/17 02/15/18 Yes [name], MD  Magnesium Oxide 500 MG CAPS Take 1 capsule (500 mg total) by mouth 2 (two) times daily at 8 am and 10 pm. 11/17/17 02/15/18 Yes [name], MD  methocarbamol (ROBAXIN) 750 MG tablet Take  1 tablet (750 mg total) by mouth every 8 (eight) hours as needed for muscle spasms. 12/02/17 03/02/18 Yes [name], MD  morphine (MS CONTIN) 30 MG 12 hr tablet Take 1 tablet (30 mg total) by mouth every 12 (twelve) hours. 12/02/17 01/01/18 Yes [name], MD  ondansetron (ZOFRAN-ODT) 8 MG disintegrating tablet Take 8 mg by mouth every 8 (eight) hours as needed for nausea or vomiting.   Yes [provider]  oxybutynin (DITROPAN-XL) 10 MG 24 hr tablet Take 10 mg by mouth 2 (two) times daily.   Yes [provider]  oxyCODONE (OXY IR/ROXICODONE) 5 MG immediate release tablet Take 1 tablet (5 mg total) by mouth 6 (six) times daily for 7 days. Max: 6/day 12/09/17 12/16/17 Yes [name], MD  polycarbophil (FIBERCON) 625 MG tablet Take 625 mg by mouth 3 (three) times daily.    Yes [provider]  polyethylene glycol (MIRALAX / GLYCOLAX) packet Take 17 g by mouth 2 (two) times daily. 11/17/17 02/15/18 Yes [name], MD  promethazine (PHENERGAN) 25 MG tablet Take 1 tablet (25 mg total) by mouth 2 (two) times daily as needed for nausea or vomiting. 11/17/17 02/15/18 Yes [name], MD  morphine (MS CONTIN) 30 MG 12 hr  tablet Take 1 tablet (30 mg total) by mouth every 12 (twelve) hours. 01/01/18 01/31/18  [name], MD  oxyCODONE (OXY IR/ROXICODONE) 5 MG immediate release tablet Take 1 tablet (5 mg total) by mouth daily for 7 days. Max: 1/day 01/13/18 01/20/18  [name], MD  oxyCODONE (OXY IR/ROXICODONE) 5 MG immediate release tablet Take 1 tablet (5 mg total) by mouth 2 (two) times daily for 7 days. Max: 2/day 01/06/18 01/13/18  [name], MD  oxyCODONE (OXY IR/ROXICODONE) 5 MG immediate release tablet Take 1 tablet (5 mg total) by mouth 3 (three) times daily for 7 days. Max: 3/day 12/30/17 01/06/18  [name], MD  oxyCODONE (OXY IR/ROXICODONE) 5 MG immediate release tablet Take 1 tablet (5 mg total) by mouth 4 (four) times  daily for 7 days. Max: 4/day 12/23/17 12/30/17  [name], MD  oxyCODONE (OXY IR/ROXICODONE) 5 MG immediate release tablet Take 1 tablet (5 mg total) by mouth 5 (five) times daily for 7 days. Max: 5/day 12/16/17 12/23/17  [name], MD  oxyCODONE (OXY IR/ROXICODONE) 5 MG immediate release tablet For the next 7 days take 2 tab with breakfast, dinner, and at bedtime. Take only 1 tab at noon. Max: 7/day 12/02/17 12/09/17  [name], MD  PRESCRIPTION MEDICATION morphine ambulatory pump    [provider]    Allergies Tape  Family History  Problem Relation Age of Onset  . Diabetes Mother   . Pneumonia Father     Social History Social History   Tobacco Use  . Smoking status: Never Smoker  . Smokeless tobacco: Former Engineer, water Use Topics  . Alcohol use: No  . Drug use: No    Review of Systems Constitutional: Negative for fever. Eyes: Negative for visual changes. ENT: Negative for facial injury or neck injury Cardiovascular: Negative for chest injury. Respiratory: Negative for shortness of breath. Negative for chest wall injury. Gastrointestinal: Negative for abdominal pain or injury. Genitourinary: Negative for dysuria. Musculoskeletal: + back pain, R elbow pain, L knee pain Skin: Negative for laceration/abrasions. Neurological: Negative for head injury.   ____________________________________________   PHYSICAL EXAM:  VITAL SIGNS: ED Triage Vitals  Enc Vitals Group     BP 12/10/17 0853 (!) 107/106     Pulse Rate 12/10/17 0853 91     Resp 12/10/17 0853 20     Temp 12/10/17 0853 98.2 F (36.8 C)     Temp Source 12/10/17 0853 Oral     SpO2 12/10/17 0853 93 %     Weight 12/10/17 0854 (!) 340 lb (154.2 kg)     Height 12/10/17 0854 5\' 10"  (1.778 m)     Head Circumference --      Peak Flow --      Pain Score 12/10/17 0854 8     Pain Loc --      Pain Edu? --      Excl. in GC? --    Full spinal precautions maintained throughout the  trauma exam. Constitutional: Alert and oriented. No acute distress. Does not appear intoxicated. HEENT Head: Normocephalic and atraumatic. Face: No facial bony tenderness. Stable midface Ears: No hemotympanum bilaterally. No Battle sign Eyes: No eye injury. PERRL. No raccoon eyes Nose: Nontender. No epistaxis. No rhinorrhea Mouth/Throat: Mucous membranes are moist. No oropharyngeal blood. No dental injury. Airway patent without stridor. Normal voice. Neck: no C-collar in place. No midline c-spine tenderness.  Cardiovascular: Normal rate, regular rhythm. Normal and symmetric distal pulses are present in all extremities. Pulmonary/Chest: Chest  wall is stable and nontender to palpation/compression. Normal respiratory effort. Breath sounds are normal. No crepitus.  Abdominal: Soft, nontender, non distended. Musculoskeletal: abrasion of the left knee with diffuse ttp but full ROM. Bruising of the right elbow with full ROM. Nontender with normal full range of motion in all extremities. No deformities. No thoracic or lumbar midline spinal tenderness. Diffuse paraspinal tenderness to palpation. Pelvis is stable. Skin: Skin is warm, dry and intact. No abrasions or contutions. Psychiatric: Speech and behavior are appropriate. Neurological: Normal speech and language. Moves all extremities to command. No gross focal neurologic deficits are appreciated.  Glascow Coma Score: 4 - Opens eyes on own 6 - Follows simple motor commands 5 - Alert and oriented GCS: 15  ____________________________________________   LABS (all labs ordered are listed, but only abnormal results are displayed)  Labs Reviewed  CBC - Abnormal; Notable for the following components:      Result Value   RDW 15.5 (*)    All other components within normal limits  BASIC METABOLIC PANEL - Abnormal; Notable for the following components:   Glucose, Bld 111 (*)    Calcium 8.3 (*)    All other components within normal limits    ____________________________________________  EKG  none  ____________________________________________  RADIOLOGY  I have personally reviewed the images performed during this visit and I agree with the Radiologist's read.   Interpretation by Radiologist:  Dg Elbow Complete Right  Result Date: 12/10/2017 CLINICAL DATA:  Right elbow pain after fall last night. EXAM: RIGHT ELBOW - COMPLETE 3+ VIEW COMPARISON:  None. FINDINGS: There is no evidence of fracture, dislocation, or joint effusion. There is no evidence of arthropathy or other focal bone abnormality. Soft tissues are unremarkable. IMPRESSION: Normal right elbow. Electronically Signed   By: Lupita Raider, M.D.   On: 12/10/2017 10:20   Dg Knee Complete 4 Views Left  Result Date: 12/10/2017 CLINICAL DATA:  Left knee pain after fall last night. EXAM: LEFT KNEE - COMPLETE 4+ VIEW COMPARISON:  Radiographs of November 27, 2017. FINDINGS: Status post left total knee arthroplasty. The femoral and tibial components appear to be well situated. No fracture or dislocation is noted. No soft tissue abnormality is noted. IMPRESSION: Status post left total knee arthroplasty. No acute abnormality seen in the left knee. Electronically Signed   By: Lupita Raider, M.D.   On: 12/10/2017 10:22      ____________________________________________   PROCEDURES  Procedure(s) performed: None Procedures Critical Care performed:  None ____________________________________________   INITIAL IMPRESSION / ASSESSMENT AND PLAN / ED COURSE  52 y.o. male with history of chronic pain on narcotics for 25 years who presents for evaluation of several falls.  Patient with several pain complaints which are mostly chronic for him and unchanged from baseline.  His only new complaints are right elbow and left knee pain which both have abrasions to it.  His tetanus is up-to-date.  No obvious deformities and full range of motion.  X-ray of the elbow and the knee are  pending.  I have ordered a physical therapy consult to evaluate if patient needs placement per wife's request as he seems to be unsteady at home with several falls.  Clinical Course as of Dec 11 1239  Thu Dec 10, 2017  1237 X-rays negative for any acute findings.  Labs within baseline.  Patient no longer wishes a physical therapy evaluation and would like to go home.  Patient was able to ambulate with no difficulty.  Will discharge home at this time.   [CV]    Clinical Course User Index [CV] Don Perking [place], MD     As part of my medical decision making, I reviewed the following data within the electronic MEDICAL RECORD NUMBER Nursing notes reviewed and incorporated, Labs reviewed , Old chart reviewed, Radiograph reviewed , Notes from prior ED visits and [place] Controlled Substance Database    Pertinent labs & imaging results that were available during my care of the patient were reviewed by me and considered in my medical decision making (see chart for details).    ____________________________________________   FINAL CLINICAL IMPRESSION(S) / ED DIAGNOSES  Final diagnoses:  Fall, initial encounter  Abrasion of right elbow, initial encounter  Abrasion of left elbow, initial encounter      NEW MEDICATIONS STARTED DURING THIS VISIT:  ED Discharge Orders    None       Note:  This document was prepared using Dragon voice recognition software and may include unintentional dictation errors.    Don Perking, [place], MD 12/10/17 1242

## 2017-12-18 ENCOUNTER — Other Ambulatory Visit: Payer: Self-pay | Admitting: Pain Medicine

## 2017-12-18 DIAGNOSIS — T50905A Adverse effect of unspecified drugs, medicaments and biological substances, initial encounter: Secondary | ICD-10-CM

## 2017-12-18 DIAGNOSIS — R112 Nausea with vomiting, unspecified: Secondary | ICD-10-CM

## 2017-12-18 DIAGNOSIS — R110 Nausea: Secondary | ICD-10-CM

## 2017-12-24 ENCOUNTER — Ambulatory Visit
Admission: RE | Admit: 2017-12-24 | Discharge: 2017-12-24 | Disposition: A | Discharge status: Home / Self Care | Payer: No Typology Code available for payment source | Source: Ambulatory Visit | Attending: Pain Medicine | Admitting: Pain Medicine

## 2017-12-24 ENCOUNTER — Encounter: Payer: Self-pay | Admitting: Pain Medicine

## 2017-12-24 ENCOUNTER — Other Ambulatory Visit: Payer: Self-pay

## 2017-12-24 ENCOUNTER — Ambulatory Visit (HOSPITAL_BASED_OUTPATIENT_CLINIC_OR_DEPARTMENT_OTHER): Payer: No Typology Code available for payment source | Admitting: Pain Medicine

## 2017-12-24 VITALS — BP 143/72 | HR 92 | Temp 97.0°F | Resp 19 | Ht 70.5 in | Wt 350.0 lb

## 2017-12-24 DIAGNOSIS — M5137 Other intervertebral disc degeneration, lumbosacral region: Secondary | ICD-10-CM

## 2017-12-24 DIAGNOSIS — M545 Low back pain: Secondary | ICD-10-CM | POA: Diagnosis not present

## 2017-12-24 DIAGNOSIS — M961 Postlaminectomy syndrome, not elsewhere classified: Secondary | ICD-10-CM | POA: Diagnosis not present

## 2017-12-24 DIAGNOSIS — G8929 Other chronic pain: Secondary | ICD-10-CM | POA: Insufficient documentation

## 2017-12-24 DIAGNOSIS — Z888 Allergy status to other drugs, medicaments and biological substances status: Secondary | ICD-10-CM | POA: Insufficient documentation

## 2017-12-24 DIAGNOSIS — Z9889 Other specified postprocedural states: Secondary | ICD-10-CM | POA: Insufficient documentation

## 2017-12-24 DIAGNOSIS — M47816 Spondylosis without myelopathy or radiculopathy, lumbar region: Secondary | ICD-10-CM | POA: Diagnosis not present

## 2017-12-24 DIAGNOSIS — M47817 Spondylosis without myelopathy or radiculopathy, lumbosacral region: Secondary | ICD-10-CM

## 2017-12-24 MED ORDER — LACTATED RINGERS IV SOLN
1000.0000 mL | Freq: Once | INTRAVENOUS | Status: AC
  Administered 2017-12-24: 1000 mL via INTRAVENOUS

## 2017-12-24 MED ORDER — TRIAMCINOLONE ACETONIDE 40 MG/ML IJ SUSP
80.0000 mg | Freq: Once | INTRAMUSCULAR | Status: AC
  Administered 2017-12-24: 80 mg
  Filled 2017-12-24: qty 2

## 2017-12-24 MED ORDER — LIDOCAINE HCL 2 % IJ SOLN
20.0000 mL | Freq: Once | INTRAMUSCULAR | Status: AC
  Administered 2017-12-24: 400 mg
  Filled 2017-12-24: qty 40

## 2017-12-24 MED ORDER — ROPIVACAINE HCL 2 MG/ML IJ SOLN
18.0000 mL | Freq: Once | INTRAMUSCULAR | Status: AC
  Administered 2017-12-24: 18 mL via PERINEURAL
  Filled 2017-12-24: qty 20

## 2017-12-24 MED ORDER — MIDAZOLAM HCL 5 MG/5ML IJ SOLN
1.0000 mg | INTRAMUSCULAR | Status: DC | PRN
  Administered 2017-12-24: 5 mg via INTRAVENOUS
  Filled 2017-12-24: qty 5

## 2017-12-24 MED ORDER — FENTANYL CITRATE (PF) 100 MCG/2ML IJ SOLN
25.0000 ug | INTRAMUSCULAR | Status: DC | PRN
  Administered 2017-12-24: 100 ug via INTRAVENOUS
  Filled 2017-12-24: qty 2

## 2017-12-24 NOTE — Patient Instructions (Signed)
____________________________________________________________________________________________  Post-Procedure Discharge Instructions  Instructions:  Apply ice: Fill a plastic sandwich bag with crushed ice. Cover it with a small towel and apply to injection site. Apply for 15 minutes then remove x 15 minutes. Repeat sequence on day of procedure, until you go to bed. The purpose is to minimize swelling and discomfort after procedure.  Apply heat: Apply heat to procedure site starting the day following the procedure. The purpose is to treat any soreness and discomfort from the procedure.  Food intake: Start with clear liquids (like water) and advance to regular food, as tolerated.   Physical activities: Keep activities to a minimum for the first 8 hours after the procedure.   Driving: If you have received any sedation, you are not allowed to drive for 24 hours after your procedure.  Blood thinner: Restart your blood thinner 6 hours after your procedure. (Only for those taking blood thinners)  Insulin: As soon as you can eat, you may resume your normal dosing schedule. (Only for those taking insulin)  Infection prevention: Keep procedure site clean and dry.  Post-procedure Pain Diary: Extremely important that this be done correctly and accurately. Recorded information will be used to determine the next step in treatment.  Pain evaluated is that of treated area only. Do not include pain from an untreated area.  Complete every hour, on the hour, for the initial 8 hours. Set an alarm to help you do this part accurately.  Do not go to sleep and have it completed later. It will not be accurate.  Follow-up appointment: Keep your follow-up appointment after the procedure. Usually 2 weeks for most procedures. (6 weeks in the case of radiofrequency.) Bring you pain diary.   Expect:  From numbing medicine (AKA: Local Anesthetics): Numbness or decrease in pain.  Onset: Full effect within 15  minutes of injected.  Duration: It will depend on the type of local anesthetic used. On the average, 1 to 8 hours.   From steroids: Decrease in swelling or inflammation. Once inflammation is improved, relief of the pain will follow.  Onset of benefits: Depends on the amount of swelling present. The more swelling, the longer it will take for the benefits to be seen. In some cases, up to 10 days.  Duration: Steroids will stay in the system x 2 weeks. Duration of benefits will depend on multiple posibilities including persistent irritating factors.  Occasional side-effects: Facial flushing, cramps (if present, drink Gatorade and take over-the-counter Magnesium 450-500 mg once to twice a day).  From procedure: Some discomfort is to be expected once the numbing medicine wears off. This should be minimal if ice and heat are applied as instructed.  Call if:  You experience numbness and weakness that gets worse with time, as opposed to wearing off.  New onset bowel or bladder incontinence. (This applies to Spinal procedures only)  Emergency Numbers:  Durning business hours (Monday - Thursday, 8:00 AM - 4:00 PM) (Friday, 9:00 AM - 12:00 Noon): [phone]  After hours: [phone] ____________________________________________________________________________________________   ____________________________________________________________________________________________  Pain Scale  Introduction: The pain score used by this practice is the Verbal Numerical Rating Scale (VNRS-11). This is an 11-point scale. It is for adults and children 10 years or older. There are significant differences in how the pain score is reported, used, and applied. Forget everything you learned in the past and learn this scoring system.  General Information: The scale should reflect your current level of pain. Unless you are specifically asked   for the level of your worst pain, or your average pain. If you are asked  for one of these two, then it should be understood that it is over the past 24 hours.  Basic Activities of Daily Living (ADL): Personal hygiene, dressing, eating, transferring, and using restroom.  Instructions: Most patients tend to report their level of pain as a combination of two factors, their physical pain and their psychosocial pain. This last one is also known as "suffering" and it is reflection of how physical pain affects you socially and psychologically. From now on, report them separately. From this point on, when asked to report your pain level, report only your physical pain. Use the following table for reference.  Pain Clinic Pain Levels (0-5/10)  Pain Level Score  Description  No Pain 0   Mild pain 1 Nagging, annoying, but does not interfere with basic activities of daily living (ADL). Patients are able to eat, bathe, get dressed, toileting (being able to get on and off the toilet and perform personal hygiene functions), transfer (move in and out of bed or a chair without assistance), and maintain continence (able to control bladder and bowel functions). Blood pressure and heart rate are unaffected. A normal heart rate for a healthy adult ranges from 60 to 100 bpm (beats per minute).   Mild to moderate pain 2 Noticeable and distracting. Impossible to hide from other people. More frequent flare-ups. Still possible to adapt and function close to normal. It can be very annoying and may have occasional stronger flare-ups. With discipline, patients may get used to it and adapt.   Moderate pain 3 Interferes significantly with activities of daily living (ADL). It becomes difficult to feed, bathe, get dressed, get on and off the toilet or to perform personal hygiene functions. Difficult to get in and out of bed or a chair without assistance. Very distracting. With effort, it can be ignored when deeply involved in activities.   Moderately severe pain 4 Impossible to ignore for more than a few  minutes. With effort, patients may still be able to manage work or participate in some social activities. Very difficult to concentrate. Signs of autonomic nervous system discharge are evident: dilated pupils (mydriasis); mild sweating (diaphoresis); sleep interference. Heart rate becomes elevated (>115 bpm). Diastolic blood pressure (lower number) rises above 100 mmHg. Patients find relief in laying down and not moving.   Severe pain 5 Intense and extremely unpleasant. Associated with frowning face and frequent crying. Pain overwhelms the senses.  Ability to do any activity or maintain social relationships becomes significantly limited. Conversation becomes difficult. Pacing back and forth is common, as getting into a comfortable position is nearly impossible. Pain wakes you up from deep sleep. Physical signs will be obvious: pupillary dilation; increased sweating; goosebumps; brisk reflexes; cold, clammy hands and feet; nausea, vomiting or dry heaves; loss of appetite; significant sleep disturbance with inability to fall asleep or to remain asleep. When persistent, significant weight loss is observed due to the complete loss of appetite and sleep deprivation.  Blood pressure and heart rate becomes significantly elevated. Caution: If elevated blood pressure triggers a pounding headache associated with blurred vision, then the patient should immediately seek attention at an urgent or emergency care unit, as these may be signs of an impending stroke.    Emergency Department Pain Levels (6-10/10)  Emergency Room Pain 6 Severely limiting. Requires emergency care and should not be seen or managed at an outpatient pain management facility. Communication becomes difficult   and requires great effort. Assistance to reach the emergency department may be required. Facial flushing and profuse sweating along with potentially dangerous increases in heart rate and blood pressure will be evident.   Distressing pain 7  Self-care is very difficult. Assistance is required to transport, or use restroom. Assistance to reach the emergency department will be required. Tasks requiring coordination, such as bathing and getting dressed become very difficult.   Disabling pain 8 Self-care is no longer possible. At this level, pain is disabling. The individual is unable to do even the most "basic" activities such as walking, eating, bathing, dressing, transferring to a bed, or toileting. Fine motor skills are lost. It is difficult to think clearly.   Incapacitating pain 9 Pain becomes incapacitating. Thought processing is no longer possible. Difficult to remember your own name. Control of movement and coordination are lost.   The worst pain imaginable 10 At this level, most patients pass out from pain. When this level is reached, collapse of the autonomic nervous system occurs, leading to a sudden drop in blood pressure and heart rate. This in turn results in a temporary and dramatic drop in blood flow to the brain, leading to a loss of consciousness. Fainting is one of the body's self defense mechanisms. Passing out puts the brain in a calmed state and causes it to shut down for a while, in order to begin the healing process.    Summary: 1. Refer to this scale when providing us with your pain level. 2. Be accurate and careful when reporting your pain level. This will help with your care. 3. Over-reporting your pain level will lead to loss of credibility. 4. Even a level of 1/10 means that there is pain and will be treated at our facility. 5. High, inaccurate reporting will be documented as "Symptom Exaggeration", leading to loss of credibility and suspicions of possible secondary gains such as obtaining more narcotics, or wanting to appear disabled, for fraudulent reasons. 6. Only pain levels of 5 or below will be seen at our facility. 7. Pain levels of 6 and above will be sent to the Emergency Department and the appointment  cancelled. ____________________________________________________________________________________________    

## 2017-12-24 NOTE — Progress Notes (Signed)
Patient's Name: [name]  MRN: [id number]  Referring Provider: Delano MetzNaveira, Crosby Bevan, MD  DOB: 06/08/1965  PCP: Johny BlamerHarris, William, MD  DOS: 12/24/2017  Note by: Oswaldo DoneFrancisco A Sylvana Bonk, MD  Service setting: Ambulatory outpatient  Specialty: Interventional Pain Management  Patient type: Established  Location: [facility] (AMB) Pain Management Facility  Visit type: Interventional Procedure   Primary Reason for Visit: Interventional Pain Management Treatment. CC: Back Pain (low)  Procedure:          Anesthesia, Analgesia, Anxiolysis:  Type: Lumbar Facet, Medial Branch Block(s) #1  Primary Purpose: Diagnostic Region: Posterolateral Lumbosacral Spine Level: L2, L3, L4, L5, & S1 Medial Branch Level(s). Injecting these levels blocks the L3-4, L4-5, and L5-S1 lumbar facet joints. Laterality: Bilateral  Type: Moderate (Conscious) Sedation combined with Local Anesthesia Indication(s): Analgesia and Anxiety Route: Intravenous (IV) IV Access: Secured Sedation: Meaningful verbal contact was maintained at all times during the procedure  Local Anesthetic: Lidocaine 1-2%   Indications: 1. Spondylosis without myelopathy or radiculopathy, lumbosacral region   2. Lumbar facet syndrome (Bilateral) (R>L)   3. DDD (degenerative disc disease), lumbosacral   4. Failed back surgical syndrome   5. Chronic low back pain (Primary Area of Pain) (Bilateral) (R>L)    Pain Score: Pre-procedure: 6 /10 Post-procedure: 1 /10  Pre-op Assessment:  [name] is a 52 y.o. (year old), male patient, seen today for interventional treatment. He  has a past surgical history that includes Back surgery; morphine pump; Tonsillectomy; Joint replacement; Shoulder arthroscopy with subacromial decompression, rotator cuff repair and bicep tendon repair (Right, 08/18/2013); Shoulder arthroscopy with subacromial decompression and bicep tendon repair (Left, 10/06/2013); and Laparoscopic gastric sleeve resection (N/A, 01/08/2015). [name] has a  current medication list which includes the following prescription(s): baclofen, clonidine, esomeprazole, gabapentin enacarbil, magnesium oxide, methocarbamol, morphine, morphine, ondansetron, oxybutynin, oxycodone, oxycodone, oxycodone, oxycodone, polycarbophil, polyethylene glycol, PRESCRIPTION MEDICATION, and promethazine, and the following Facility-Administered Medications: fentanyl and midazolam. His primarily concern today is the Back Pain (low)  Initial Vital Signs:  Pulse/HCG Rate: 92ECG Heart Rate: 95 Temp: 98.2 F (36.8 C) Resp: 18 BP: (!) 149/77 SpO2: 100 %  BMI: Estimated body mass index is 49.51 kg/m as calculated from the following:   Height as of this encounter: 5' 10.5" (1.791 m).   Weight as of this encounter: 350 lb (158.8 kg).  Risk Assessment: Allergies: Reviewed. He is allergic to tape.  Allergy Precautions: None required Coagulopathies: Reviewed. None identified.  Blood-thinner therapy: None at this time Active Infection(s): Reviewed. None identified. [name] is afebrile  Site Confirmation: [name] was asked to confirm the procedure and laterality before marking the site Procedure checklist: Completed Consent: Before the procedure and under the influence of no sedative(s), amnesic(s), or anxiolytics, the patient was informed of the treatment options, risks and possible complications. To fulfill our ethical and legal obligations, as recommended by the American Medical Association's Code of Ethics, I have informed the patient of my clinical impression; the nature and purpose of the treatment or procedure; the risks, benefits, and possible complications of the intervention; the alternatives, including doing nothing; the risk(s) and benefit(s) of the alternative treatment(s) or procedure(s); and the risk(s) and benefit(s) of doing nothing. The patient was provided information about the general risks and possible complications associated with the procedure. These may  include, but are not limited to: failure to achieve desired goals, infection, bleeding, organ or nerve damage, allergic reactions, paralysis, and death. In addition, the patient was informed of those risks and complications  associated to Spine-related procedures, such as failure to decrease pain; infection (i.e.: Meningitis, epidural or intraspinal abscess); bleeding (i.e.: epidural hematoma, subarachnoid hemorrhage, or any other type of intraspinal or peri-dural bleeding); organ or nerve damage (i.e.: Any type of peripheral nerve, nerve root, or spinal cord injury) with subsequent damage to sensory, motor, and/or autonomic systems, resulting in permanent pain, numbness, and/or weakness of one or several areas of the body; allergic reactions; (i.e.: anaphylactic reaction); and/or death. Furthermore, the patient was informed of those risks and complications associated with the medications. These include, but are not limited to: allergic reactions (i.e.: anaphylactic or anaphylactoid reaction(s)); adrenal axis suppression; blood sugar elevation that in diabetics may result in ketoacidosis or comma; water retention that in patients with history of congestive heart failure may result in shortness of breath, pulmonary edema, and decompensation with resultant heart failure; weight gain; swelling or edema; medication-induced neural toxicity; particulate matter embolism and blood vessel occlusion with resultant organ, and/or nervous system infarction; and/or aseptic necrosis of one or more joints. Finally, the patient was informed that Medicine is not an exact science; therefore, there is also the possibility of unforeseen or unpredictable risks and/or possible complications that may result in a catastrophic outcome. The patient indicated having understood very clearly. We have given the patient no guarantees and we have made no promises. Enough time was given to the patient to ask questions, all of which were answered  to the patient's satisfaction. Mr. Hanauer has indicated that he wanted to continue with the procedure. Attestation: I, the ordering provider, attest that I have discussed with the patient the benefits, risks, side-effects, alternatives, likelihood of achieving goals, and potential problems during recovery for the procedure that I have provided informed consent. Date  Time: 12/24/2017 12:24 PM  Pre-Procedure Preparation:  Monitoring: As per clinic protocol. Respiration, ETCO2, SpO2, BP, heart rate and rhythm monitor placed and checked for adequate function Safety Precautions: Patient was assessed for positional comfort and pressure points before starting the procedure. Time-out: I initiated and conducted the "Time-out" before starting the procedure, as per protocol. The patient was asked to participate by confirming the accuracy of the "Time Out" information. Verification of the correct person, site, and procedure were performed and confirmed by me, the nursing staff, and the patient. "Time-out" conducted as per Joint Commission's Universal Protocol (UP.01.01.01). Time: 1304  Description of Procedure:          Position: Prone Laterality: Bilateral. The procedure was performed in identical fashion on both sides. Levels:  L2, L3, L4, L5, & S1 Medial Branch Level(s) Area Prepped: Posterior Lumbosacral Region Prepping solution: ChloraPrep (2% chlorhexidine gluconate and 70% isopropyl alcohol) Safety Precautions: Aspiration looking for blood return was conducted prior to all injections. At no point did we inject any substances, as a needle was being advanced. Before injecting, the patient was told to immediately notify me if he was experiencing any new onset of "ringing in the ears, or metallic taste in the mouth". No attempts were made at seeking any paresthesias. Safe injection practices and needle disposal techniques used. Medications properly checked for expiration dates. SDV (single dose vial)  medications used. After the completion of the procedure, all disposable equipment used was discarded in the proper designated medical waste containers. Local Anesthesia: Protocol guidelines were followed. The patient was positioned over the fluoroscopy table. The area was prepped in the usual manner. The time-out was completed. The target area was identified using fluoroscopy. A 12-in long, straight, sterile hemostat was used  with fluoroscopic guidance to locate the targets for each level blocked. Once located, the skin was marked with an approved surgical skin marker. Once all sites were marked, the skin (epidermis, dermis, and hypodermis), as well as deeper tissues (fat, connective tissue and muscle) were infiltrated with a small amount of a short-acting local anesthetic, loaded on a 10cc syringe with a 25G, 1.5-in  Needle. An appropriate amount of time was allowed for local anesthetics to take effect before proceeding to the next step. Local Anesthetic: Lidocaine 2.0% The unused portion of the local anesthetic was discarded in the proper designated containers. Technical explanation of process:  L2 Medial Branch Nerve Block (MBB): The target area for the L2 medial branch is at the junction of the postero-lateral aspect of the superior articular process and the superior, posterior, and medial edge of the transverse process of L3. Under fluoroscopic guidance, a Quincke needle was inserted until contact was made with os over the superior postero-lateral aspect of the pedicular shadow (target area). After negative aspiration for blood, 0.5 mL of the nerve block solution was injected without difficulty or complication. The needle was removed intact. L3 Medial Branch Nerve Block (MBB): The target area for the L3 medial branch is at the junction of the postero-lateral aspect of the superior articular process and the superior, posterior, and medial edge of the transverse process of L4. Under fluoroscopic guidance, a  Quincke needle was inserted until contact was made with os over the superior postero-lateral aspect of the pedicular shadow (target area). After negative aspiration for blood, 0.5 mL of the nerve block solution was injected without difficulty or complication. The needle was removed intact. L4 Medial Branch Nerve Block (MBB): The target area for the L4 medial branch is at the junction of the postero-lateral aspect of the superior articular process and the superior, posterior, and medial edge of the transverse process of L5. Under fluoroscopic guidance, a Quincke needle was inserted until contact was made with os over the superior postero-lateral aspect of the pedicular shadow (target area). After negative aspiration for blood, 0.5 mL of the nerve block solution was injected without difficulty or complication. The needle was removed intact. L5 Medial Branch Nerve Block (MBB): The target area for the L5 medial branch is at the junction of the postero-lateral aspect of the superior articular process and the superior, posterior, and medial edge of the sacral ala. Under fluoroscopic guidance, a Quincke needle was inserted until contact was made with os over the superior postero-lateral aspect of the pedicular shadow (target area). After negative aspiration for blood, 0.5 mL of the nerve block solution was injected without difficulty or complication. The needle was removed intact. S1 Medial Branch Nerve Block (MBB): The target area for the S1 medial branch is at the posterior and inferior 6 o'clock position of the L5-S1 facet joint. Under fluoroscopic guidance, the Quincke needle inserted for the L5 MBB was redirected until contact was made with os over the inferior and postero aspect of the sacrum, at the 6 o' clock position under the L5-S1 facet joint (Target area). After negative aspiration for blood, 0.5 mL of the nerve block solution was injected without difficulty or complication. The needle was removed  intact. Procedural Needles: 22-gauge, 5-inch, Quincke needles used for all levels. Nerve block solution: 0.2% PF-Ropivacaine + Triamcinolone (40 mg/mL) diluted to a final concentration of 4 mg of Triamcinolone/mL of Ropivacaine The unused portion of the solution was discarded in the proper designated containers.  Once the entire  procedure was completed, the treated area was cleaned, making sure to leave some of the prepping solution back to take advantage of its long term bactericidal properties.   Illustration of the posterior view of the lumbar spine and the posterior neural structures. Laminae of L2 through S1 are labeled. DPRL5, dorsal primary ramus of L5; DPRS1, dorsal primary ramus of S1; DPR3, dorsal primary ramus of L3; FJ, facet (zygapophyseal) joint L3-L4; I, inferior articular process of L4; LB1, lateral branch of dorsal primary ramus of L1; IAB, inferior articular branches from L3 medial branch (supplies L4-L5 facet joint); IBP, intermediate branch plexus; MB3, medial branch of dorsal primary ramus of L3; NR3, third lumbar nerve root; S, superior articular process of L5; SAB, superior articular branches from L4 (supplies L4-5 facet joint also); TP3, transverse process of L3.  Vitals:   12/24/17 1318 12/24/17 1325 12/24/17 1335 12/24/17 1345  BP: (!) 127/97 (!) 127/94 130/74 (!) 143/72  Pulse:      Resp: 13 13 14 19   Temp:  98.4 F (36.9 C)  (!) 97 F (36.1 C)  TempSrc:  Temporal  Temporal  SpO2: 95% 98% 99% 100%  Weight:      Height:        Start Time: 1304 hrs. End Time: 1318 hrs.  Imaging Guidance (Spinal):          Type of Imaging Technique: Fluoroscopy Guidance (Spinal) Indication(s): Assistance in needle guidance and placement for procedures requiring needle placement in or near specific anatomical locations not easily accessible without such assistance. Exposure Time: Please see nurses notes. Contrast: None used. Fluoroscopic Guidance: I was personally present during  the use of fluoroscopy. "Tunnel Vision Technique" used to obtain the best possible view of the target area. Parallax error corrected before commencing the procedure. "Direction-depth-direction" technique used to introduce the needle under continuous pulsed fluoroscopy. Once target was reached, antero-posterior, oblique, and lateral fluoroscopic projection used confirm needle placement in all planes. Images permanently stored in EMR. Interpretation: No contrast injected. I personally interpreted the imaging intraoperatively. Adequate needle placement confirmed in multiple planes. Permanent images saved into the patient's record.  Antibiotic Prophylaxis:   Anti-infectives (From admission, onward)   None     Indication(s): None identified  Post-operative Assessment:  Post-procedure Vital Signs:  Pulse/HCG Rate: 9283 Temp: (!) 97 F (36.1 C) Resp: 19 BP: (!) 143/72 SpO2: 100 %  EBL: None  Complications: No immediate post-treatment complications observed by team, or reported by patient.  Note: The patient tolerated the entire procedure well. A repeat set of vitals were taken after the procedure and the patient was kept under observation following institutional policy, for this type of procedure. Post-procedural neurological assessment was performed, showing return to baseline, prior to discharge. The patient was provided with post-procedure discharge instructions, including a section on how to identify potential problems. Should any problems arise concerning this procedure, the patient was given instructions to immediately contact us, at any time, without hesitation. In any case, we plan to contact the patient by telephone for a follow-up status report regarding this interventional procedure.  Comments:  No additional relevant information.  Plan of Care   Imaging Orders     DG C-Arm 1-60 Min-No Report  Procedure Orders     LUMBAR FACET(MEDIAL BRANCH NERVE BLOCK) MBNB  Medications  ordered for procedure: Meds ordered this encounter  Medications  . lidocaine (XYLOCAINE) 2 % (with pres) injection 400 mg  . midazolam (VERSED) 5 MG/5ML injection 1-2 mg  Make sure Flumazenil is available in the pyxis when using this medication. If oversedation occurs, administer 0.2 mg IV over 15 sec. If after 45 sec no response, administer 0.2 mg again over 1 min; may repeat at 1 min intervals; not to exceed 4 doses (1 mg)  . fentaNYL (SUBLIMAZE) injection 25-50 mcg    Make sure Narcan is available in the pyxis when using this medication. In the event of respiratory depression (RR< 8/min): Titrate NARCAN (naloxone) in increments of 0.1 to 0.2 mg IV at 2-3 minute intervals, until desired degree of reversal.  . lactated ringers infusion 1,000 mL  . ropivacaine (PF) 2 mg/mL (0.2%) (NAROPIN) injection 18 mL  . triamcinolone acetonide (KENALOG-40) injection 80 mg   Medications administered: We administered lidocaine, midazolam, fentaNYL, lactated ringers, ropivacaine (PF) 2 mg/mL (0.2%), and triamcinolone acetonide.  See the medical record for exact dosing, route, and time of administration.  New Prescriptions   No medications on file   Disposition: Discharge home  Discharge Date & Time: 12/24/2017; 1349 hrs.   Physician-requested Follow-up: Return for post-procedure eval (2 wks), w/ Dr. [name].  Future Appointments  Date Time Provider Department Center  01/13/2018 10:15 AM [name], MD [facility] None  01/26/2018 11:00 AM [name], MD [facility] None   Primary Care Physician: Johny Blamer, MD Location: [facility] Facility Note by: Oswaldo Done, MD Date: 12/24/2017; Time: 2:21 PM  Disclaimer:  Medicine is not an Visual merchandiser. The only guarantee in medicine is that nothing is guaranteed. It is important to note that the decision to proceed with this intervention was based on the information collected from the patient. The Data and  conclusions were drawn from the patient's questionnaire, the interview, and the physical examination. Because the information was provided in large part by the patient, it cannot be guaranteed that it has not been purposely or unconsciously manipulated. Every effort has been made to obtain as much relevant data as possible for this evaluation. It is important to note that the conclusions that lead to this procedure are derived in large part from the available data. Always take into account that the treatment will also be dependent on availability of resources and existing treatment guidelines, considered by other Pain Management Practitioners as being common knowledge and practice, at the time of the intervention. For Medico-Legal purposes, it is also important to point out that variation in procedural techniques and pharmacological choices are the acceptable norm. The indications, contraindications, technique, and results of the above procedure should only be interpreted and judged by a Board-Certified Interventional Pain Specialist with extensive familiarity and expertise in the same exact procedure and technique.

## 2017-12-24 NOTE — Progress Notes (Signed)
Safety precautions to be maintained throughout the outpatient stay will include: orient to surroundings, keep bed in low position, maintain call bell within reach at all times, provide assistance with transfer out of bed and ambulation.  

## 2017-12-25 ENCOUNTER — Telehealth: Payer: Self-pay

## 2017-12-25 NOTE — Telephone Encounter (Signed)
Patient was called. No problems reported

## 2017-12-30 ENCOUNTER — Other Ambulatory Visit: Payer: Self-pay | Admitting: Pain Medicine

## 2017-12-30 DIAGNOSIS — M7918 Myalgia, other site: Principal | ICD-10-CM

## 2017-12-30 DIAGNOSIS — G8929 Other chronic pain: Secondary | ICD-10-CM

## 2018-01-04 ENCOUNTER — Other Ambulatory Visit: Payer: Self-pay | Admitting: *Deleted

## 2018-01-04 ENCOUNTER — Telehealth: Payer: Self-pay | Admitting: Pain Medicine

## 2018-01-04 DIAGNOSIS — M7918 Myalgia, other site: Principal | ICD-10-CM

## 2018-01-04 DIAGNOSIS — G8929 Other chronic pain: Secondary | ICD-10-CM

## 2018-01-04 MED ORDER — METHOCARBAMOL 750 MG PO TABS: 750.0000 mg | ORAL_TABLET | Freq: Three times a day (TID) | ORAL | 0 refills | Status: DC | PRN

## 2018-01-04 NOTE — Telephone Encounter (Signed)
[name], patient is tapering his opioids for drug holiday. He was given Phenergan and Methocarbamol in August, has run out. Will you send these in?

## 2018-01-04 NOTE — Telephone Encounter (Signed)
He has refills on the phenergan Now the methocarbamol if he using this TID then it is ok to call in a refill but you know Dr Shauna HughNaveria does not like this.

## 2018-01-04 NOTE — Telephone Encounter (Signed)
Patient lvmail Friday 01-01-18 at 12:11 stating he needs meds refilled. Supposed to have med refill with Dr. Laban EmperorNaveira.

## 2018-01-05 ENCOUNTER — Other Ambulatory Visit: Payer: Self-pay | Admitting: Pain Medicine

## 2018-01-05 DIAGNOSIS — R110 Nausea: Secondary | ICD-10-CM

## 2018-01-05 DIAGNOSIS — T50905A Adverse effect of unspecified drugs, medicaments and biological substances, initial encounter: Secondary | ICD-10-CM

## 2018-01-05 DIAGNOSIS — R112 Nausea with vomiting, unspecified: Secondary | ICD-10-CM

## 2018-01-05 MED ORDER — PROMETHAZINE HCL 25 MG PO TABS: 25.0000 mg | ORAL_TABLET | Freq: Two times a day (BID) | ORAL | 2 refills | Status: DC | PRN

## 2018-01-05 NOTE — Telephone Encounter (Addendum)
Patient called again stating his pharmacy told him they have not received any scripts and dont have refills. Please call [facility] and help this patient out. His is vomiting and having a hard time with this.  [facility] is [phone]  Also, the patient was supposed to be set up for a med mgmt with Dr. Marshell GarfinkelNaveira 2wks from date meds were prescribed and for some reason that was missed.

## 2018-01-05 NOTE — Telephone Encounter (Signed)
Discussed this with Dr. Laban EmperorNaveira, will send one script for Phenergan, with no refills, patient must see GI for nausea related to gastric bypass.

## 2018-01-05 NOTE — Telephone Encounter (Signed)
Pharmacy staff states patient has already used the script and 2 refills of phenergan that was written 11-17-17. Will discuss this with Dr. Laban EmperorNaveira.

## 2018-01-07 ENCOUNTER — Other Ambulatory Visit: Payer: Self-pay

## 2018-01-07 MED ORDER — PAIN MANAGEMENT IT PUMP REFILL: 1.0000 | Freq: Once | INTRATHECAL | 0 refills | Status: DC

## 2018-01-13 ENCOUNTER — Ambulatory Visit: Payer: Self-pay | Admitting: Pain Medicine

## 2018-01-15 DIAGNOSIS — M5136 Other intervertebral disc degeneration, lumbar region: Secondary | ICD-10-CM | POA: Diagnosis not present

## 2018-01-15 DIAGNOSIS — L03115 Cellulitis of right lower limb: Secondary | ICD-10-CM | POA: Diagnosis not present

## 2018-01-15 DIAGNOSIS — K625 Hemorrhage of anus and rectum: Secondary | ICD-10-CM | POA: Diagnosis not present

## 2018-01-15 DIAGNOSIS — Z1211 Encounter for screening for malignant neoplasm of colon: Secondary | ICD-10-CM | POA: Diagnosis not present

## 2018-01-15 DIAGNOSIS — Z9884 Bariatric surgery status: Secondary | ICD-10-CM | POA: Diagnosis not present

## 2018-01-15 DIAGNOSIS — M25561 Pain in right knee: Secondary | ICD-10-CM | POA: Diagnosis not present

## 2018-01-21 ENCOUNTER — Other Ambulatory Visit: Payer: Self-pay | Admitting: Pain Medicine

## 2018-01-21 DIAGNOSIS — T402X5A Adverse effect of other opioids, initial encounter: Principal | ICD-10-CM

## 2018-01-21 DIAGNOSIS — K219 Gastro-esophageal reflux disease without esophagitis: Secondary | ICD-10-CM

## 2018-01-21 DIAGNOSIS — K5903 Drug induced constipation: Secondary | ICD-10-CM

## 2018-01-26 ENCOUNTER — Other Ambulatory Visit: Payer: Self-pay

## 2018-01-26 ENCOUNTER — Ambulatory Visit: Payer: No Typology Code available for payment source | Attending: Pain Medicine | Admitting: Pain Medicine

## 2018-01-26 ENCOUNTER — Encounter: Payer: Self-pay | Admitting: Pain Medicine

## 2018-01-26 VITALS — BP 148/86 | HR 80 | Temp 98.7°F | Resp 18 | Ht 70.0 in | Wt 347.0 lb

## 2018-01-26 DIAGNOSIS — M79604 Pain in right leg: Secondary | ICD-10-CM | POA: Diagnosis not present

## 2018-01-26 DIAGNOSIS — Z888 Allergy status to other drugs, medicaments and biological substances status: Secondary | ICD-10-CM | POA: Insufficient documentation

## 2018-01-26 DIAGNOSIS — Z79891 Long term (current) use of opiate analgesic: Secondary | ICD-10-CM | POA: Diagnosis not present

## 2018-01-26 DIAGNOSIS — Z451 Encounter for adjustment and management of infusion pump: Secondary | ICD-10-CM

## 2018-01-26 DIAGNOSIS — M5410 Radiculopathy, site unspecified: Secondary | ICD-10-CM

## 2018-01-26 DIAGNOSIS — M79605 Pain in left leg: Secondary | ICD-10-CM | POA: Diagnosis not present

## 2018-01-26 DIAGNOSIS — M7918 Myalgia, other site: Secondary | ICD-10-CM

## 2018-01-26 DIAGNOSIS — M5441 Lumbago with sciatica, right side: Secondary | ICD-10-CM | POA: Insufficient documentation

## 2018-01-26 DIAGNOSIS — F1190 Opioid use, unspecified, uncomplicated: Secondary | ICD-10-CM

## 2018-01-26 DIAGNOSIS — M47816 Spondylosis without myelopathy or radiculopathy, lumbar region: Secondary | ICD-10-CM | POA: Insufficient documentation

## 2018-01-26 DIAGNOSIS — M961 Postlaminectomy syndrome, not elsewhere classified: Secondary | ICD-10-CM

## 2018-01-26 DIAGNOSIS — Z5181 Encounter for therapeutic drug level monitoring: Secondary | ICD-10-CM | POA: Diagnosis not present

## 2018-01-26 DIAGNOSIS — M5137 Other intervertebral disc degeneration, lumbosacral region: Secondary | ICD-10-CM | POA: Insufficient documentation

## 2018-01-26 DIAGNOSIS — M5442 Lumbago with sciatica, left side: Secondary | ICD-10-CM | POA: Insufficient documentation

## 2018-01-26 DIAGNOSIS — K219 Gastro-esophageal reflux disease without esophagitis: Secondary | ICD-10-CM

## 2018-01-26 DIAGNOSIS — G894 Chronic pain syndrome: Secondary | ICD-10-CM | POA: Insufficient documentation

## 2018-01-26 DIAGNOSIS — Z79899 Other long term (current) drug therapy: Secondary | ICD-10-CM | POA: Diagnosis not present

## 2018-01-26 DIAGNOSIS — Z978 Presence of other specified devices: Secondary | ICD-10-CM

## 2018-01-26 DIAGNOSIS — M47817 Spondylosis without myelopathy or radiculopathy, lumbosacral region: Secondary | ICD-10-CM

## 2018-01-26 DIAGNOSIS — G8929 Other chronic pain: Secondary | ICD-10-CM

## 2018-01-26 DIAGNOSIS — M62838 Other muscle spasm: Secondary | ICD-10-CM

## 2018-01-26 MED ORDER — BACLOFEN 10 MG PO TABS: 10.0000 mg | ORAL_TABLET | Freq: Four times a day (QID) | ORAL | 0 refills | Status: DC

## 2018-01-26 MED ORDER — OXYCODONE HCL 5 MG PO TABS: 5.0000 mg | ORAL_TABLET | Freq: Every day | ORAL | 0 refills | Status: DC

## 2018-01-26 MED ORDER — OXYCODONE HCL 5 MG PO TABS: ORAL_TABLET | ORAL | 0 refills | Status: DC

## 2018-01-26 MED ORDER — OXYCODONE HCL 5 MG PO TABS: 5.0000 mg | ORAL_TABLET | Freq: Four times a day (QID) | ORAL | 0 refills | Status: DC

## 2018-01-26 MED ORDER — OXYCODONE HCL 5 MG PO TABS: 5.0000 mg | ORAL_TABLET | Freq: Two times a day (BID) | ORAL | 0 refills | Status: DC

## 2018-01-26 MED ORDER — OXYCODONE HCL 5 MG PO TABS: 5.0000 mg | ORAL_TABLET | Freq: Three times a day (TID) | ORAL | 0 refills | Status: DC

## 2018-01-26 NOTE — Patient Instructions (Addendum)
____________________________________________________________________________________________  Post-Procedure Discharge Instructions  Instructions:  Apply ice: Fill a plastic sandwich bag with crushed ice. Cover it with a small towel and apply to injection site. Apply for 15 minutes then remove x 15 minutes. Repeat sequence on day of procedure, until you go to bed. The purpose is to minimize swelling and discomfort after procedure.  Apply heat: Apply heat to procedure site starting the day following the procedure. The purpose is to treat any soreness and discomfort from the procedure.  Food intake: Start with clear liquids (like water) and advance to regular food, as tolerated.   Physical activities: Keep activities to a minimum for the first 8 hours after the procedure.   Driving: If you have received any sedation, you are not allowed to drive for 24 hours after your procedure.  Blood thinner: Restart your blood thinner 6 hours after your procedure. (Only for those taking blood thinners)  Insulin: As soon as you can eat, you may resume your normal dosing schedule. (Only for those taking insulin)  Infection prevention: Keep procedure site clean and dry.  Post-procedure Pain Diary: Extremely important that this be done correctly and accurately. Recorded information will be used to determine the next step in treatment.  Pain evaluated is that of treated area only. Do not include pain from an untreated area.  Complete every hour, on the hour, for the initial 8 hours. Set an alarm to help you do this part accurately.  Do not go to sleep and have it completed later. It will not be accurate.  Follow-up appointment: Keep your follow-up appointment after the procedure. Usually 2 weeks for most procedures. (6 weeks in the case of radiofrequency.) Bring you pain diary.   Expect:  From numbing medicine (AKA: Local Anesthetics): Numbness or decrease in pain.  Onset: Full effect within 15  minutes of injected.  Duration: It will depend on the type of local anesthetic used. On the average, 1 to 8 hours.   From steroids: Decrease in swelling or inflammation. Once inflammation is improved, relief of the pain will follow.  Onset of benefits: Depends on the amount of swelling present. The more swelling, the longer it will take for the benefits to be seen. In some cases, up to 10 days.  Duration: Steroids will stay in the system x 2 weeks. Duration of benefits will depend on multiple posibilities including persistent irritating factors.  Occasional side-effects: Facial flushing, cramps (if present, drink Gatorade and take over-the-counter Magnesium 450-500 mg once to twice a day).  From procedure: Some discomfort is to be expected once the numbing medicine wears off. This should be minimal if ice and heat are applied as instructed.  Call if:  You experience numbness and weakness that gets worse with time, as opposed to wearing off.  New onset bowel or bladder incontinence. (This applies to Spinal procedures only)  Emergency Numbers:  Durning business hours (Monday - Thursday, 8:00 AM - 4:00 PM) (Friday, 9:00 AM - 12:00 Noon): [phone]  After hours: [phone] ____________________________________________________________________________________________   ____________________________________________________________________________________________  Pain Scale  Introduction: The pain score used by this practice is the Verbal Numerical Rating Scale (VNRS-11). This is an 11-point scale. It is for adults and children 10 years or older. There are significant differences in how the pain score is reported, used, and applied. Forget everything you learned in the past and learn this scoring system.  General Information: The scale should reflect your current level of pain. Unless you are specifically asked   for the level of your worst pain, or your average pain. If you are asked  for one of these two, then it should be understood that it is over the past 24 hours.  Basic Activities of Daily Living (ADL): Personal hygiene, dressing, eating, transferring, and using restroom.  Instructions: Most patients tend to report their level of pain as a combination of two factors, their physical pain and their psychosocial pain. This last one is also known as "suffering" and it is reflection of how physical pain affects you socially and psychologically. From now on, report them separately. From this point on, when asked to report your pain level, report only your physical pain. Use the following table for reference.  Pain Clinic Pain Levels (0-5/10)  Pain Level Score  Description  No Pain 0   Mild pain 1 Nagging, annoying, but does not interfere with basic activities of daily living (ADL). Patients are able to eat, bathe, get dressed, toileting (being able to get on and off the toilet and perform personal hygiene functions), transfer (move in and out of bed or a chair without assistance), and maintain continence (able to control bladder and bowel functions). Blood pressure and heart rate are unaffected. A normal heart rate for a healthy adult ranges from 60 to 100 bpm (beats per minute).   Mild to moderate pain 2 Noticeable and distracting. Impossible to hide from other people. More frequent flare-ups. Still possible to adapt and function close to normal. It can be very annoying and may have occasional stronger flare-ups. With discipline, patients may get used to it and adapt.   Moderate pain 3 Interferes significantly with activities of daily living (ADL). It becomes difficult to feed, bathe, get dressed, get on and off the toilet or to perform personal hygiene functions. Difficult to get in and out of bed or a chair without assistance. Very distracting. With effort, it can be ignored when deeply involved in activities.   Moderately severe pain 4 Impossible to ignore for more than a few  minutes. With effort, patients may still be able to manage work or participate in some social activities. Very difficult to concentrate. Signs of autonomic nervous system discharge are evident: dilated pupils (mydriasis); mild sweating (diaphoresis); sleep interference. Heart rate becomes elevated (>115 bpm). Diastolic blood pressure (lower number) rises above 100 mmHg. Patients find relief in laying down and not moving.   Severe pain 5 Intense and extremely unpleasant. Associated with frowning face and frequent crying. Pain overwhelms the senses.  Ability to do any activity or maintain social relationships becomes significantly limited. Conversation becomes difficult. Pacing back and forth is common, as getting into a comfortable position is nearly impossible. Pain wakes you up from deep sleep. Physical signs will be obvious: pupillary dilation; increased sweating; goosebumps; brisk reflexes; cold, clammy hands and feet; nausea, vomiting or dry heaves; loss of appetite; significant sleep disturbance with inability to fall asleep or to remain asleep. When persistent, significant weight loss is observed due to the complete loss of appetite and sleep deprivation.  Blood pressure and heart rate becomes significantly elevated. Caution: If elevated blood pressure triggers a pounding headache associated with blurred vision, then the patient should immediately seek attention at an urgent or emergency care unit, as these may be signs of an impending stroke.    Emergency Department Pain Levels (6-10/10)  Emergency Room Pain 6 Severely limiting. Requires emergency care and should not be seen or managed at an outpatient pain management facility. Communication becomes difficult  and requires great effort. Assistance to reach the emergency department may be required. Facial flushing and profuse sweating along with potentially dangerous increases in heart rate and blood pressure will be evident.   Distressing pain 7  Self-care is very difficult. Assistance is required to transport, or use restroom. Assistance to reach the emergency department will be required. Tasks requiring coordination, such as bathing and getting dressed become very difficult.   Disabling pain 8 Self-care is no longer possible. At this level, pain is disabling. The individual is unable to do even the most "basic" activities such as walking, eating, bathing, dressing, transferring to a bed, or toileting. Fine motor skills are lost. It is difficult to think clearly.   Incapacitating pain 9 Pain becomes incapacitating. Thought processing is no longer possible. Difficult to remember your own name. Control of movement and coordination are lost.   The worst pain imaginable 10 At this level, most patients pass out from pain. When this level is reached, collapse of the autonomic nervous system occurs, leading to a sudden drop in blood pressure and heart rate. This in turn results in a temporary and dramatic drop in blood flow to the brain, leading to a loss of consciousness. Fainting is one of the body's self defense mechanisms. Passing out puts the brain in a calmed state and causes it to shut down for a while, in order to begin the healing process.    Summary: 1. Refer to this scale when providing [place] with your pain level. 2. Be accurate and careful when reporting your pain level. This will help with your care. 3. Over-reporting your pain level will lead to loss of credibility. 4. Even a level of 1/10 means that there is pain and will be treated at our facility. 5. High, inaccurate reporting will be documented as "Symptom Exaggeration", leading to loss of credibility and suspicions of possible secondary gains such as obtaining more narcotics, or wanting to appear disabled, for fraudulent reasons. 6. Only pain levels of 5 or below will be seen at our facility. 7. Pain levels of 6 and above will be sent to the Emergency Department and the appointment  cancelled. ____________________________________________________________________________________________   ____________________________________________________________________________________________  Drug Holidays (Slow)  What is a "Drug Holiday"? Drug Holiday: is the name given to the period of time during which a patient stops taking a medication(s) for the purpose of eliminating tolerance to the drug.  Benefits . Improved effectiveness of opioids. . Decreased opioid dose needed to achieve benefits. . Improved pain with lesser dose.  What is tolerance? Tolerance: is the progressive decreased in effectiveness of a drug due to its repetitive use. With repetitive use, the body gets use to the medication and as a consequence, it loses its effectiveness. This is a common problem seen with opioid pain medications. As a result, a larger dose of the drug is needed to achieve the same effect that used to be obtained with a smaller dose.  How long should a "Drug Holiday" last? At least 14 consecutive days. (2 weeks)  What are withdrawals? Withdrawals: refers to the wide range of symptoms that occur after stopping or dramatically reducing opiate drugs after heavy and prolonged use. Withdrawal symptoms do not occur to patients that use low dose opioids, or those who take the medication sporadically. Contrary to benzodiazepine (example: Valium, Xanax, etc.) or alcohol withdrawals ("Delirium Tremens"), opioid withdrawals are not lethal. Withdrawals are the physical manifestation of the body getting rid of the excess receptors.  Expected Symptoms  Early symptoms of withdrawal may include: . Agitation . Anxiety . Muscle aches . Increased tearing . Insomnia . Runny nose . Sweating . Yawning  Late symptoms of withdrawal may include: . Abdominal cramping . Diarrhea . Dilated pupils . Goose bumps . Nausea . Vomiting  Will I experience withdrawals? Due to the slow nature of the taper, it is  very unlikely that you will experience any.  What is a slow taper? Taper: refers to the gradual decrease in dose. ___________________________________________________________________________________________ Bonita Quin were given one prescription for Baclofen and tapered Oxycodone.

## 2018-01-26 NOTE — Progress Notes (Signed)
Nursing Pain Medication Assessment:  Safety precautions to be maintained throughout the outpatient stay will include: orient to surroundings, keep bed in low position, maintain call bell within reach at all times, provide assistance with transfer out of bed and ambulation.  Medication Inspection Compliance: Pill count conducted under aseptic conditions, in front of the patient. Neither the pills nor the bottle was removed from the patient's sight at any time. Once count was completed pills were immediately returned to the patient in their original bottle.  Medication #1: Oxycodone IR Pill/Patch Count: 2 of 35 pills remain Pill/Patch Appearance: Markings consistent with prescribed medication Bottle Appearance: Standard pharmacy container. Clearly labeled. Filled Date: 01/01/18 / 2019 Last Medication intake:  6 days ago  Medication #2: Morphine ER (MSContin) Pill/Patch Count: 13 of 60 pills remain Pill/Patch Appearance: Markings consistent with prescribed medication Bottle Appearance: Standard pharmacy container. Clearly labeled. Filled Date:09/20/ 2019 Last Medication intake:  Yesterday

## 2018-01-26 NOTE — Progress Notes (Signed)
Patient's Name: [name]  MRN: [id number]  Referring Provider: Shirline Frees, MD  DOB: [birth year]  PCP: Shirline Frees, MD  DOS: 01/26/2018  Note by: Gaspar Cola, MD  Service setting: Ambulatory outpatient  Specialty: Interventional Pain Management  Patient type: Established  Location: [facility] (AMB) Pain Management Facility  Visit type: Interventional Procedure   Primary Reason for Visit: Interventional Pain Management Treatment. CC: Back Pain (lower)  Procedure:          Intrathecal Drug Delivery System (IDDS):  Type: [place] Refill [phone]) + [place] [phone]) Today we will lower PF-Morphine concentration from 25 mg/mL to a more acceptable recommended concentration maximum of 20 mg/mL.  In addition, we will add PF-Bupivacaine 20 mg/mL to the mixture, so as to boost its analgesic properties, minimizing the opioid side effects.  Region: Abdominal Laterality: Left  Type of Pump: Medtronic Synchromed II (MRI-compatible) Delivery Route: Intrathecal Type of Pain Treated: Neuropathic/Nociceptive Primary Medication Class: Opioid/opiate  Medication, Concentration, Infusion Program, & Delivery Rate: Please see scanned programming printout.   Indications: 1. Chronic pain syndrome   2. Failed back surgical syndrome   3. Lumbar facet syndrome (Bilateral) (R>L)   4. Lumbar spondylosis   5. Chronic low back pain (Bilateral) w/ sciatica (Bilateral)   6. Chronic lower extremity pain (Bilateral) (L>R)   7. Chronic lower extremity radicular pain   8. DDD (degenerative disc disease), lumbosacral   9. Presence of intrathecal pump   10. Pharmacologic therapy   11. Opiate use (> 150 MME/day)   12. Encounter for interrogation of infusion pump    Pain Assessment: Self-Reported Pain Score: 5 /10 Clinically the patient looks like a 2/10 Reported level is inconsistent with clinical observations. Information on the proper use of the pain score provided to the patient  today.  Post-Procedure Assessment  12/24/2017 Procedure: Diagnostic bilateral lumbar facet block #1 under fluoroscopic guidance and IV sedation Pre-procedure pain score:  6/10 Post-procedure pain score: 1/10 (> 50% relief) Influential Factors: BMI: 49.79 kg/m Intra-procedural challenges: None observed.         Assessment challenges: None detected.              Reported side-effects: None.        Post-procedural adverse reactions or complications: None reported             Sedation: Sedation provided. When no sedatives are used, the analgesic levels obtained are directly associated to the effectiveness of the local anesthetics. However, when sedation is provided, the level of analgesia obtained during the initial 1 hour following the intervention, is believed to be the result of a combination of factors. These factors may include, but are not limited to: 1. The effectiveness of the local anesthetics used. 2. The effects of the analgesic(s) and/or anxiolytic(s) used. 3. The degree of discomfort experienced by the patient at the time of the procedure. 4. The patients ability and reliability in recalling and recording the events. 5. The presence and influence of possible secondary gains and/or psychosocial factors. Reported result: Relief experienced during the 1st hour after the procedure: 90 % (Ultra-Short Term Relief)            Interpretative annotation: Clinically appropriate result. Analgesia during this period is likely to be Local Anesthetic and/or IV Sedative (Analgesic/Anxiolytic) related.          Effects of local anesthetic: The analgesic effects attained during this period are directly associated to the localized infiltration of local anesthetics and therefore cary significant  diagnostic value as to the etiological location, or anatomical origin, of the pain. Expected duration of relief is directly dependent on the pharmacodynamics of the local anesthetic used. Long-acting (4-6  hours) anesthetics used.  Reported result: Relief during the next 4 to 6 hour after the procedure: 90 % (Short-Term Relief)            Interpretative annotation: Clinically appropriate result. Analgesia during this period is likely to be Local Anesthetic-related.          Long-term benefit: Defined as the period of time past the expected duration of local anesthetics (1 hour for short-acting and 4-6 hours for long-acting). With the possible exception of prolonged sympathetic blockade from the local anesthetics, benefits during this period are typically attributed to, or associated with, other factors such as analgesic sensory neuropraxia, antiinflammatory effects, or beneficial biochemical changes provided by agents other than the local anesthetics.  Reported result: Extended relief following procedure: 10 % (Long-Term Relief)            Interpretative annotation: Clinically possible results. Recurrence of symptoms. Incomplete therapeutic success. Limited inflammation. Possible mechanical aggravating factors. Etiology is likely mechanical rather than inflammatory.  Current benefits: Defined as reported results that persistent at this point in time.   Analgesia: 0-25 % Mr. Mitro reports improvement of axial symptoms. Function: Somewhat improved ROM: Somewhat improved Interpretative annotation: Persistent symptomatology. Limited therapeutic benefit. Results would suggest persistent aggravating factors.          Interpretation: Results would suggest a successful diagnostic intervention. We'll proceed with diagnostic intervention #2, as soon as convenient          Plan:  Proceed with diagnostic procedure No.: 2          Intrathecal Pump Therapy Assessment  Manufacturer: Medtronic Synchromed II Type: Programmable Volume: 40 mL reservoir MRI compatibility: Yes   Drug content:  Primary Medication Class: Opioid Primary Medication: PF-Morphine 20 mg/mL  Secondary Medication: PF-Bupivacaine  20  mg/mL Other Medication: None   Programming:  Type: Simple continuous. See pump readout for details.  We'll start running the infusion at 9.0 mg/day (9.0 mg/day of preservative-free bupivacaine +9.0 mg/day of preservative-free morphine).  Changes:  Medication Change: None at this point Rate Change: No change in rate  Reported side-effects or adverse reactions: None reported  Effectiveness: Described as relatively effective, allowing for increase in activities of daily living (ADL) Clinically meaningful improvement in function (CMIF): Sustained CMIF goals met  Plan: Pump refill today  Pre-op Assessment:  Mr. Taddei is a 52 y.o. (year old), male patient, seen today for interventional treatment. He  has a past surgical history that includes Back surgery; morphine pump; Tonsillectomy; Joint replacement; Shoulder arthroscopy with subacromial decompression, rotator cuff repair and bicep tendon repair (Right, 08/18/2013); Shoulder arthroscopy with subacromial decompression and bicep tendon repair (Left, 10/06/2013); and Laparoscopic gastric sleeve resection (N/A, 01/08/2015). Mr. Wulf has a current medication list which includes the following prescription(s): baclofen, esomeprazole, gabapentin enacarbil, magnesium oxide, morphine, ondansetron, oxybutynin, polycarbophil, polyethylene glycol, PRESCRIPTION MEDICATION, promethazine, oxycodone, oxycodone, oxycodone, oxycodone, oxycodone, oxycodone, and oxycodone. His primarily concern today is the Back Pain (lower)  Initial Vital Signs:  Pulse/HCG Rate: 80  Temp: 98.7 F (37.1 C) Resp: 18 BP: (!) 148/86 SpO2: 100 %  BMI: Estimated body mass index is 49.79 kg/m as calculated from the following:   Height as of this encounter: '5\' 10"'  (1.778 m).   Weight as of this encounter: 347 lb (157.4 kg).  Risk Assessment: Allergies: Reviewed.  He is allergic to tape.  Allergy Precautions: None required Coagulopathies: Reviewed. None identified.   Blood-thinner therapy: None at this time Active Infection(s): Reviewed. None identified. Mr. Klingbeil is afebrile  Site Confirmation: Mr. Zylstra was asked to confirm the procedure and laterality before marking the site Procedure checklist: Completed Consent: Before the procedure and under the influence of no sedative(s), amnesic(s), or anxiolytics, the patient was informed of the treatment options, risks and possible complications. To fulfill our ethical and legal obligations, as recommended by the American Medical Association's Code of Ethics, I have informed the patient of my clinical impression; the nature and purpose of the treatment or procedure; the risks, benefits, and possible complications of the intervention; the alternatives, including doing nothing; the risk(s) and benefit(s) of the alternative treatment(s) or procedure(s); and the risk(s) and benefit(s) of doing nothing.  Mr. Culp was provided with information about the general risks and possible complications associated with most interventional procedures. These include, but are not limited to: failure to achieve desired goals, infection, bleeding, organ or nerve damage, allergic reactions, paralysis, and/or death.  In addition, he was informed of those risks and possible complications associated to this particular procedure, which include, but are not limited to: damage to the implant; failure to decrease pain; local, systemic, or serious CNS infections, intraspinal abscess with possible cord compression and paralysis, or life-threatening such as meningitis; bleeding; organ damage; nerve injury or damage with subsequent sensory, motor, and/or autonomic system dysfunction, resulting in transient or permanent pain, numbness, and/or weakness of one or several areas of the body; allergic reactions, either minor or major life-threatening, such as anaphylactic or anaphylactoid reactions.  Furthermore, Mr. Kops was informed of those risks  and complications associated with the medications. These include, but are not limited to: allergic reactions (i.e.: anaphylactic or anaphylactoid reactions); endorphine suppression; bradycardia and/or hypotension; water retention and/or peripheral vascular relaxation leading to lower extremity edema and possible stasis ulcers; respiratory depression and/or shortness of breath; decreased metabolic rate leading to weight gain; swelling or edema; medication-induced neural toxicity; particulate matter embolism and blood vessel occlusion with resultant organ, and/or nervous system infarction; and/or intrathecal granuloma formation with possible spinal cord compression and permanent paralysis.  Before refilling the pump Mr. Pascucci was informed that some of the medications used in the devise may not be FDA approved for such use and therefore it constitutes an off-label use of the medications.  Finally, he was informed that Medicine is not an exact science; therefore, there is also the possibility of unforeseen or unpredictable risks and/or possible complications that may result in a catastrophic outcome. The patient indicated having understood very clearly. We have given the patient no guarantees and we have made no promises. Enough time was given to the patient to ask questions, all of which were answered to the patient's satisfaction. Mr. Goldie has indicated that he wanted to continue with the procedure. Attestation: I, the ordering provider, attest that I have discussed with the patient the benefits, risks, side-effects, alternatives, likelihood of achieving goals, and potential problems during recovery for the procedure that I have provided informed consent. Date  Time: 01/26/2018 10:23 AM  Pre-Procedure Preparation:  Monitoring: As per clinic protocol. Respiration, ETCO2, SpO2, BP, heart rate and rhythm monitor placed and checked for adequate function Safety Precautions: Patient was assessed for  positional comfort and pressure points before starting the procedure. Time-out: I initiated and conducted the "Time-out" before starting the procedure, as per protocol. The patient was asked to participate by  confirming the accuracy of the "Time Out" information. Verification of the correct person, site, and procedure were performed and confirmed by me, the nursing staff, and the patient. "Time-out" conducted as per Joint Commission's Universal Protocol (UP.01.01.01). Time: 1053  Description of Procedure:          Position: Supine Target Area: Central-port of intrathecal pump. Approach: Anterior, 90 degree angle approach. Area Prepped: Entire Area around the pump implant. Prepping solution: ChloraPrep (2% chlorhexidine gluconate and 70% isopropyl alcohol) Safety Precautions: Aspiration looking for blood return was conducted prior to all injections. At no point did we inject any substances, as a needle was being advanced. No attempts were made at seeking any paresthesias. Safe injection practices and needle disposal techniques used. Medications properly checked for expiration dates. SDV (single dose vial) medications used. Description of the Procedure: Protocol guidelines were followed. Two nurses trained to do implant refills were present during the entire procedure. The refill medication was checked by both healthcare providers as well as the patient. The patient was included in the "Time-out" to verify the medication. The patient was placed in position. The pump was identified. The area was prepped in the usual manner. The sterile template was positioned over the pump, making sure the side-port location matched that of the pump. Both, the pump and the template were held for stability. The needle provided in the Medtronic Kit was then introduced thru the center of the template and into the central port. The pump content was aspirated and discarded volume documented. The new medication was slowly infused  into the pump, thru the filter, making sure to avoid overpressure of the device. The needle was then removed and the area cleansed, making sure to leave some of the prepping solution back to take advantage of its long term bactericidal properties. The pump was interrogated and programmed to reflect the correct medication, volume, and dosage. The program was printed and taken to the physician for approval. Once checked and signed by the physician, a copy was provided to the patient and another scanned into the EMR. Vitals:   01/26/18 1022  BP: (!) 148/86  Pulse: 80  Resp: 18  Temp: 98.7 F (37.1 C)  TempSrc: Oral  SpO2: 100%  Weight: (!) 347 lb (157.4 kg)  Height: '5\' 10"'  (1.778 m)    Start Time: 1055 hrs. End Time: 1108 hrs. Materials & Medications: Medtronic Refill Kit Medication(s): Please see chart orders for details.  Imaging Guidance:          Type of Imaging Technique: None used Indication(s): N/A Exposure Time: No patient exposure Contrast: None used. Fluoroscopic Guidance: N/A Ultrasound Guidance: N/A Interpretation: N/A  Antibiotic Prophylaxis:   Anti-infectives (From admission, onward)   None     Indication(s): None identified  Post-operative Assessment:  Post-procedure Vital Signs:  Pulse/HCG Rate: 80  Temp: 98.7 F (37.1 C) Resp: 18 BP: (!) 148/86 SpO2: 100 %  EBL: None  Complications: No immediate post-treatment complications observed by team, or reported by patient.  Note: The patient tolerated the entire procedure well. A repeat set of vitals were taken after the procedure and the patient was kept under observation following institutional policy, for this type of procedure. Post-procedural neurological assessment was performed, showing return to baseline, prior to discharge. The patient was provided with post-procedure discharge instructions, including a section on how to identify potential problems. Should any problems arise concerning this procedure,  the patient was given instructions to immediately contact us, at any time, without hesitation.  In any case, we plan to contact the patient by telephone for a follow-up status report regarding this interventional procedure.  Comments:  No additional relevant information.  Controlled Substance Pharmacotherapy Assessment REMS (Risk Evaluation and Mitigation Strategy)  Starting Opioid Analgesics: methadone 5 mg twice a day + morphine ER 60 mg twice a day (120 mg/day of morphine);+ intrathecal morphine (PF-Morphine 25 mg/mL) Current Opioid Analgesic: Morphine ER 30 mg 1 tablet twice a day (60 mg/day of morphine) + intrathecal morphine (PF-Morphine 20 mg/mL) Highest recorded MME/day:145m/day+ intrathecal morphine  Starting MME/day:1534mday (Not counting intrathecal opioids) Current MME: 60 mg/day (Not counting intrathecal opioids) WhLandis MartinsRN  01/26/2018 11:11 AM  Sign at close encounter Nursing Pain Medication Assessment:  Safety precautions to be maintained throughout the outpatient stay will include: orient to surroundings, keep bed in low position, maintain call bell within reach at all times, provide assistance with transfer out of bed and ambulation.  Medication Inspection Compliance: Pill count conducted under aseptic conditions, in front of the patient. Neither the pills nor the bottle was removed from the patient's sight at any time. Once count was completed pills were immediately returned to the patient in their original bottle.  Medication #1: Oxycodone IR Pill/Patch Count: 2 of 35 pills remain Pill/Patch Appearance: Markings consistent with prescribed medication Bottle Appearance: Standard pharmacy container. Clearly labeled. Filled Date: 01/01/18 / 2019 Last Medication intake:  6 days ago  Medication #2: Morphine ER (MSContin) Pill/Patch Count: 13 of 60 pills remain Pill/Patch Appearance: Markings consistent with prescribed medication Bottle Appearance: Standard  pharmacy container. Clearly labeled. Filled Date:09/20/ 2019 Last Medication intake:  Yesterday   Pharmacokinetics: Liberation and absorption (onset of action): WNL Distribution (time to peak effect): WNL Metabolism and excretion (duration of action): WNL         Pharmacodynamics: Desired effects: Analgesia: Mr. HuBudlongeports >50% benefit. Functional ability: Patient reports that medication allows him to accomplish basic ADLs Clinically meaningful improvement in function (CMIF): Sustained CMIF goals met Perceived effectiveness: Described as relatively effective, allowing for increase in activities of daily living (ADL) Undesirable effects: Side-effects or Adverse reactions: None reported Monitoring: [place] PMP: Online review of the past 1255-monthriod conducted. Compliant with practice rules and regulations Last UDS on record: Summary  Date Value Ref Range Status  12/02/2017 FINAL  Final    Comment:    ==================================================================== TOXASSURE SELECT 13 (MW) ==================================================================== Test                             Result       Flag       Units Drug Present and Declared for Prescription Verification   Morphine                       15584        EXPECTED   ng/mg creat   Normorphine                    250          EXPECTED   ng/mg creat    Potential sources of large amounts of morphine in the absence of    codeine include administration of morphine or use of heroin.    Normorphine is an expected metabolite of morphine.   Hydromorphone                  205  EXPECTED   ng/mg creat    Hydromorphone may be present as a metabolite of morphine;    concentrations of hydromorphone rarely exceed 5% of the morphine    concentration when this is the source of hydromorphone. Drug Present not Declared for Prescription Verification   Methadone                      272          UNEXPECTED ng/mg creat   EDDP  (Methadone Mtb)           226          UNEXPECTED ng/mg creat    Sources of methadone include scheduled prescription medications.    EDDP is an expected metabolite of methadone. Drug Absent but Declared for Prescription Verification   Oxycodone                      Not Detected UNEXPECTED ng/mg creat ==================================================================== Test                      Result    Flag   Units      Ref Range   Creatinine              58               mg/dL      >=20 ==================================================================== Declared Medications:  The flagging and interpretation on this report are based on the  following declared medications.  Unexpected results may arise from  inaccuracies in the declared medications.  **Note: The testing scope of this panel includes these medications:  Morphine (MS Contin)  Oxycodone (Roxicodone)  **Note: The testing scope of this panel does not include small to  moderate amounts of these reported medications:  Morphine Pump  **Note: The testing scope of this panel does not include following  reported medications:  Baclofen  Clonidine (Catapres)  Gabapentin  Magnesium Oxide  Methocarbamol  Omeprazole (Nexium)  Ondansetron (Zofran)  Polyethylene Glycol  Promethazine ==================================================================== For clinical consultation, please call [phone]. ====================================================================    Medication Assessment Form: Reviewed. Patient indicates being compliant with therapy Treatment compliance: Compliant Risk Assessment Profile: Aberrant behavior: See prior evaluations. None observed or detected today Comorbid factors increasing risk of overdose: See prior notes. No additional risks detected today Risk of substance use disorder (SUD): Low  ORT Scoring interpretation table:  Score <3 = Low Risk for SUD  Score between 4-7 = Moderate Risk  for SUD  Score >8 = High Risk for Opioid Abuse   Risk Mitigation Strategies:  Patient Counseling: Covered Patient-Prescriber Agreement (PPA): Present and active  Notification to other healthcare providers: Done  Pharmacologic Plan: No change in therapy, at this time.             Assessment  Primary Diagnosis & Pertinent Problem List: The primary encounter diagnosis was Chronic pain syndrome. Diagnoses of Failed back surgical syndrome, Lumbar facet syndrome (Bilateral) (R>L), Lumbar spondylosis, Chronic low back pain (Bilateral) w/ sciatica (Bilateral), Chronic lower extremity pain (Bilateral) (L>R), Chronic lower extremity radicular pain, DDD (degenerative disc disease), lumbosacral, Presence of intrathecal pump, Pharmacologic therapy, Opiate use (> 150 MME/day), Encounter for interrogation of infusion pump, Spondylosis without myelopathy or radiculopathy, lumbosacral region, Chronic musculoskeletal pain, Muscle spasticity, and Gastroesophageal reflux disease without esophagitis were also pertinent to this visit.  Status Diagnosis  Controlled Controlled Controlled 1. Chronic pain syndrome   2. Failed back  surgical syndrome   3. Lumbar facet syndrome (Bilateral) (R>L)   4. Lumbar spondylosis   5. Chronic low back pain (Bilateral) w/ sciatica (Bilateral)   6. Chronic lower extremity pain (Bilateral) (L>R)   7. Chronic lower extremity radicular pain   8. DDD (degenerative disc disease), lumbosacral   9. Presence of intrathecal pump   10. Pharmacologic therapy   11. Opiate use (> 150 MME/day)   12. Encounter for interrogation of infusion pump   13. Spondylosis without myelopathy or radiculopathy, lumbosacral region   14. Chronic musculoskeletal pain   15. Muscle spasticity   16. Gastroesophageal reflux disease without esophagitis     Problems updated and reviewed during this visit: Problem  Lumbar Spondylosis  Chronic nausea s/p bariatric surgery   Plan of Care   Imaging Orders    No imaging studies ordered today    Procedure Orders     LUMBAR FACET(MEDIAL BRANCH NERVE BLOCK) MBNB     LUMBAR FACET(MEDIAL BRANCH NERVE BLOCK) MBNB     PUMP REFILL     PUMP REFILL     PUMP REPROGRAM  Medications ordered for procedure: Meds ordered this encounter  Medications  . oxyCODONE (OXY IR/ROXICODONE) 5 MG immediate release tablet    Sig: Take 1 tablet (5 mg total) by mouth daily for 7 days. Max: 1/day    Dispense:  7 tablet    Refill:  0    Prescription is part of a downward taper. Fill prescriptions in the correct order. Failure to do so may trigger withdrawal syndrome. Fill date: 03/13/18. To last until: 03/20/18  . oxyCODONE (OXY IR/ROXICODONE) 5 MG immediate release tablet    Sig: Take 1 tablet (5 mg total) by mouth 2 (two) times daily for 7 days. Max: 2/day    Dispense:  14 tablet    Refill:  0    Prescription is part of a downward taper. Fill prescriptions in the correct order. Failure to do so may trigger withdrawal syndrome. Fill date: 03/06/18. To last until: 03/13/18  . oxyCODONE (OXY IR/ROXICODONE) 5 MG immediate release tablet    Sig: Take 1 tablet (5 mg total) by mouth 3 (three) times daily for 7 days. Max: 3/day    Dispense:  21 tablet    Refill:  0    Prescription is part of a downward taper. Fill prescriptions in the correct order. Failure to do so may trigger withdrawal syndrome. Fill date: 02/27/18. To last until: 03/06/18  . oxyCODONE (OXY IR/ROXICODONE) 5 MG immediate release tablet    Sig: Take 1 tablet (5 mg total) by mouth 4 (four) times daily for 7 days. Max: 4/day    Dispense:  28 tablet    Refill:  0    Prescription is part of a downward taper. Fill prescriptions in the correct order. Failure to do so may trigger withdrawal syndrome. Fill date: 02/20/18. To last until: 02/27/18  . oxyCODONE (OXY IR/ROXICODONE) 5 MG immediate release tablet    Sig: Take 1 tablet (5 mg total) by mouth 5 (five) times daily for 7 days. Max: 5/day    Dispense:  35  tablet    Refill:  0    Prescription is part of a downward taper. Fill prescriptions in the correct order. Failure to do so may trigger withdrawal syndrome. Fill date: 02/13/18. To last until: 02/20/18  . oxyCODONE (OXY IR/ROXICODONE) 5 MG immediate release tablet    Sig: Take 1 tablet (5 mg total) by mouth 6 (six) times daily  for 7 days. Max: 6/day    Dispense:  42 tablet    Refill:  0    Prescription is part of a downward taper. Fill prescriptions in the correct order. Failure to do so may trigger withdrawal syndrome. Fill date: 02/06/18. To last until: 02/13/18  . oxyCODONE (OXY IR/ROXICODONE) 5 MG immediate release tablet    Sig: For the next 7 days take 2 tab with breakfast, dinner, and at bedtime. Take only 1 tab at noon. Max: 7/day    Dispense:  49 tablet    Refill:  0    Prescription is part of a downward taper. Fill prescriptions in the correct order. Failure to do so may trigger withdrawal syndrome. Fill date: 01/30/18. To last until: 02/06/18  . baclofen (LIORESAL) 10 MG tablet    Sig: Take 1 tablet (10 mg total) by mouth 4 (four) times daily.    Dispense:  268 tablet    Refill:  0    Do not place medication on "Automatic Refill". Fill one day early if pharmacy is closed on scheduled refill date.   Medications administered: Jeneen Rinks B. Alguire had no medications administered during this visit.  See the medical record for exact dosing, route, and time of administration.  Disposition: Discharge home  Discharge Date & Time: 01/26/2018; 1120 hrs.   Physician-requested Follow-up: Return for post-procedure eval (2 wks), w/ Dr. Dossie Arbour, Pump Refill (as per pump program) (Max:83mo.  We'll have the patient return on or before 04/03/2018 for his medication refill.  By this time, he should have completed his 14-day drug holiday.  This should help in eliminating the patient's opioid tolerance, thereby decreasing his analgesic opiate requirements.  If everything goes according to plan, we should  be able to complete the first side on his lumbar radiofrequency, so asked to decrease his level of pain and therefore further decrease his analgesic opiate requirements.  No future appointments. Primary Care Physician: HShirline Frees [facility] Note by: FGaspar Cola MD Date: 01/26/2018; Time: 11:20 AM  Disclaimer:  Medicine is not an exact science. The only guarantee in medicine is that nothing is guaranteed. It is important to note that the decision to proceed with this intervention was based on the information collected from the patient. The Data and conclusions were drawn from the patient's questionnaire, the interview, and the physical examination. Because the information was provided in large part by the patient, it cannot be guaranteed that it has not been purposely or unconsciously manipulated. Every effort has been made to obtain as much relevant data as possible for this evaluation. It is important to note that the conclusions that lead to this procedure are derived in large part from the available data. Always take into account that the treatment will also be dependent on availability of resources and existing treatment guidelines, considered by other Pain Management Practitioners as being common knowledge and practice, at the time of the intervention. For Medico-Legal purposes, it is also important to point out that variation in procedural techniques and pharmacological choices are the acceptable norm. The indications, contraindications, technique, and results of the above procedure should only be interpreted and judged by a Board-Certified Interventional Pain Specialist with extensive familiarity and expertise in the same exact procedure and technique.

## 2018-01-27 NOTE — Telephone Encounter (Signed)
Denies any needs with his pump. Instructed to call if needed.

## 2018-01-31 LAB — TOXASSURE SELECT 13 (MW), URINE

## 2018-02-04 ENCOUNTER — Encounter: Payer: Self-pay | Admitting: Pain Medicine

## 2018-02-04 MED FILL — Medication: INTRATHECAL | Qty: 1 | Status: AC

## 2018-02-17 ENCOUNTER — Encounter: Payer: Self-pay | Admitting: Pain Medicine

## 2018-03-09 ENCOUNTER — Other Ambulatory Visit: Payer: Self-pay

## 2018-03-09 MED ORDER — PAIN MANAGEMENT IT PUMP REFILL: 1.0000 | Freq: Once | INTRATHECAL | 0 refills | Status: AC

## 2018-03-10 NOTE — Progress Notes (Signed)
The patient had received a prescription, from us, for methadone on 11/17/2017 to taper it down.  Methadone is a long-acting narcotic that is stored in the liver and expected to linger in the system for up to 45 days, depending on the levels.

## 2018-04-01 ENCOUNTER — Other Ambulatory Visit: Payer: Self-pay

## 2018-04-01 ENCOUNTER — Encounter: Payer: Self-pay | Admitting: Pain Medicine

## 2018-04-01 ENCOUNTER — Ambulatory Visit: Payer: No Typology Code available for payment source | Attending: Pain Medicine | Admitting: Pain Medicine

## 2018-04-01 VITALS — BP 137/73 | HR 108 | Temp 98.2°F | Resp 22 | Ht 70.0 in | Wt >= 6400 oz

## 2018-04-01 DIAGNOSIS — F329 Major depressive disorder, single episode, unspecified: Secondary | ICD-10-CM | POA: Insufficient documentation

## 2018-04-01 DIAGNOSIS — M792 Neuralgia and neuritis, unspecified: Secondary | ICD-10-CM

## 2018-04-01 DIAGNOSIS — M62838 Other muscle spasm: Secondary | ICD-10-CM

## 2018-04-01 DIAGNOSIS — M47897 Other spondylosis, lumbosacral region: Secondary | ICD-10-CM | POA: Diagnosis not present

## 2018-04-01 DIAGNOSIS — G894 Chronic pain syndrome: Secondary | ICD-10-CM | POA: Diagnosis not present

## 2018-04-01 DIAGNOSIS — M545 Low back pain, unspecified: Secondary | ICD-10-CM

## 2018-04-01 DIAGNOSIS — K5903 Drug induced constipation: Secondary | ICD-10-CM | POA: Diagnosis not present

## 2018-04-01 DIAGNOSIS — Z79899 Other long term (current) drug therapy: Secondary | ICD-10-CM | POA: Diagnosis not present

## 2018-04-01 DIAGNOSIS — Z451 Encounter for adjustment and management of infusion pump: Secondary | ICD-10-CM | POA: Insufficient documentation

## 2018-04-01 DIAGNOSIS — T402X5A Adverse effect of other opioids, initial encounter: Secondary | ICD-10-CM | POA: Diagnosis not present

## 2018-04-01 DIAGNOSIS — Z79891 Long term (current) use of opiate analgesic: Secondary | ICD-10-CM | POA: Insufficient documentation

## 2018-04-01 DIAGNOSIS — K219 Gastro-esophageal reflux disease without esophagitis: Secondary | ICD-10-CM

## 2018-04-01 DIAGNOSIS — G8929 Other chronic pain: Secondary | ICD-10-CM

## 2018-04-01 DIAGNOSIS — Z888 Allergy status to other drugs, medicaments and biological substances status: Secondary | ICD-10-CM | POA: Insufficient documentation

## 2018-04-01 DIAGNOSIS — M47816 Spondylosis without myelopathy or radiculopathy, lumbar region: Secondary | ICD-10-CM

## 2018-04-01 DIAGNOSIS — M5137 Other intervertebral disc degeneration, lumbosacral region: Secondary | ICD-10-CM | POA: Diagnosis not present

## 2018-04-01 DIAGNOSIS — R110 Nausea: Secondary | ICD-10-CM

## 2018-04-01 DIAGNOSIS — Z5181 Encounter for therapeutic drug level monitoring: Secondary | ICD-10-CM | POA: Diagnosis not present

## 2018-04-01 DIAGNOSIS — M47817 Spondylosis without myelopathy or radiculopathy, lumbosacral region: Secondary | ICD-10-CM

## 2018-04-01 DIAGNOSIS — Z6841 Body Mass Index (BMI) 40.0 and over, adult: Secondary | ICD-10-CM | POA: Diagnosis not present

## 2018-04-01 DIAGNOSIS — M961 Postlaminectomy syndrome, not elsewhere classified: Secondary | ICD-10-CM

## 2018-04-01 DIAGNOSIS — Z9884 Bariatric surgery status: Secondary | ICD-10-CM | POA: Insufficient documentation

## 2018-04-01 DIAGNOSIS — M7918 Myalgia, other site: Secondary | ICD-10-CM

## 2018-04-01 DIAGNOSIS — M51379 Other intervertebral disc degeneration, lumbosacral region without mention of lumbar back pain or lower extremity pain: Secondary | ICD-10-CM

## 2018-04-01 DIAGNOSIS — Z978 Presence of other specified devices: Secondary | ICD-10-CM

## 2018-04-01 MED ORDER — GABAPENTIN ENACARBIL ER 600 MG PO TBCR: 600.0000 mg | EXTENDED_RELEASE_TABLET | Freq: Two times a day (BID) | ORAL | 2 refills | Status: DC

## 2018-04-01 MED ORDER — OXYCODONE HCL 10 MG PO TABS: 10.0000 mg | ORAL_TABLET | Freq: Four times a day (QID) | ORAL | 0 refills | Status: DC | PRN

## 2018-04-01 MED ORDER — MAGNESIUM OXIDE -MG SUPPLEMENT 500 MG PO CAPS: 1.0000 | ORAL_CAPSULE | Freq: Two times a day (BID) | ORAL | 2 refills | Status: DC

## 2018-04-01 MED ORDER — BENEFIBER PO POWD: 6.0000 g | Freq: Three times a day (TID) | ORAL | 2 refills | Status: DC

## 2018-04-01 MED ORDER — ESOMEPRAZOLE MAGNESIUM 40 MG PO CPDR: 40.0000 mg | DELAYED_RELEASE_CAPSULE | Freq: Two times a day (BID) | ORAL | 2 refills | Status: DC

## 2018-04-01 MED ORDER — BACLOFEN 10 MG PO TABS: 10.0000 mg | ORAL_TABLET | Freq: Four times a day (QID) | ORAL | 2 refills | Status: DC

## 2018-04-01 MED ORDER — ONDANSETRON 8 MG PO TBDP: 8.0000 mg | ORAL_TABLET | Freq: Three times a day (TID) | ORAL | 2 refills | Status: DC | PRN

## 2018-04-01 NOTE — Patient Instructions (Addendum)
____________________________________________________________________________________________  Preparing for Procedure with Sedation  Instructions: . Oral Intake: Do not eat or drink anything for at least 8 hours prior to your procedure. . Transportation: Public transportation is not allowed. Bring an adult driver. The driver must be physically present in our waiting room before any procedure can be started. . Physical Assistance: Bring an adult physically capable of assisting you, in the event you need help. This adult should keep you company at home for at least 6 hours after the procedure. . Blood Pressure Medicine: Take your blood pressure medicine with a sip of water the morning of the procedure. . Blood thinners: Notify our staff if you are taking any blood thinners. Depending on which one you take, there will be specific instructions on how and when to stop it. . Diabetics on insulin: Notify the staff so that you can be scheduled 1st case in the morning. If your diabetes requires high dose insulin, take only  of your normal insulin dose the morning of the procedure and notify the staff that you have done so. . Preventing infections: Shower with an antibacterial soap the morning of your procedure. . Build-up your immune system: Take 1000 mg of Vitamin C with every meal (3 times a day) the day prior to your procedure. . Antibiotics: Inform the staff if you have a condition or reason that requires you to take antibiotics before dental procedures. . Pregnancy: If you are pregnant, call and cancel the procedure. . Sickness: If you have a cold, fever, or any active infections, call and cancel the procedure. . Arrival: You must be in the facility at least 30 minutes prior to your scheduled procedure. . Children: Do not bring children with you. . Dress appropriately: Bring dark clothing that you would not mind if they get stained. . Valuables: Do not bring any jewelry or valuables.  Procedure  appointments are reserved for interventional treatments only. . No Prescription Refills. . No medication changes will be discussed during procedure appointments. . No disability issues will be discussed.  Reasons to call and reschedule or cancel your procedure: (Following these recommendations will minimize the risk of a serious complication.) . Surgeries: Avoid having procedures within 2 weeks of any surgery. (Avoid for 2 weeks before or after any surgery). . Flu Shots: Avoid having procedures within 2 weeks of a flu shots or . (Avoid for 2 weeks before or after immunizations). . Barium: Avoid having a procedure within 7-10 days after having had a radiological study involving the use of radiological contrast. (Myelograms, Barium swallow or enema study). . Heart attacks: Avoid any elective procedures or surgeries for the initial 6 months after a "Myocardial Infarction" (Heart Attack). . Blood thinners: It is imperative that you stop these medications before procedures. Let us know if you if you take any blood thinner.  . Infection: Avoid procedures during or within two weeks of an infection (including chest colds or gastrointestinal problems). Symptoms associated with infections include: Localized redness, fever, chills, night sweats or profuse sweating, burning sensation when voiding, cough, congestion, stuffiness, runny nose, sore throat, diarrhea, nausea, vomiting, cold or Flu symptoms, recent or current infections. It is specially important if the infection is over the area that we intend to treat. . Heart and lung problems: Symptoms that may suggest an active cardiopulmonary problem include: cough, chest pain, breathing difficulties or shortness of breath, dizziness, ankle swelling, uncontrolled high or unusually low blood pressure, and/or palpitations. If you are experiencing any of these symptoms, cancel   your procedure and contact your primary care physician for an evaluation.  Remember:   Regular Business hours are:  Monday to Thursday 8:00 AM to 4:00 PM  Provider's Schedule: Velecia Ovitt, MD:  Procedure days: Tuesday and Thursday 7:30 AM to 4:00 PM  Bilal Lateef, MD:  Procedure days: Monday and Wednesday 7:30 AM to 4:00 PM ____________________________________________________________________________________________    

## 2018-04-01 NOTE — Progress Notes (Signed)
Patient's Name: [name]  MRN: [id number]  Referring Provider: Harris, William, MD  DOB: 03/16/1966  PCP: Harris, William, MD  DOS: 04/01/2018  Note by: Tawanna Funk A Javaya [place], MD  Service setting: Ambulatory outpatient  Specialty: Interventional Pain Management  Patient type: Established  Location: [facility] (AMB) Pain Management Facility  Visit type: Interventional Procedure   Primary Reason for Visit: Interventional Pain Management Treatment. CC: Back Pain  Procedure:          Intrathecal Drug Delivery System (IDDS):  Type: Reservoir Refill (95990) 20% rate increase Region: Abdominal Laterality: Left  Type of Pump: Medtronic Synchromed II (MRI-compatible) Delivery Route: Intrathecal Type of Pain Treated: Neuropathic/Nociceptive Primary Medication Class: Opioid/opiate  Medication, Concentration, Infusion Program, & Delivery Rate: Please see scanned programming printout.   Indications: 1. Chronic pain syndrome   2. Failed back surgical syndrome   3. DDD (degenerative disc disease), lumbosacral   4. Presence of intrathecal pump   5. Encounter for adjustment or management of infusion pump   6. Chronic nausea s/p bariatric surgery   7. Chronic musculoskeletal pain   8. Muscle spasticity   9. Gastroesophageal reflux disease without esophagitis   10. Neurogenic pain   11. Therapeutic opioid-induced constipation (OIC)    Pain Assessment: Self-Reported Pain Score: 5 /10             Reported level is compatible with observation.        Intrathecal Pump Therapy Assessment  Manufacturer: Medtronic Synchromed II Type: Programmable Volume: 40 mL reservoir MRI compatibility: Yes   Drug content:  Primary Medication Class: Opioid Primary Medication: PF-Morphine20 mg/mL Secondary Medication: PF-Bupivacaine 20 mg/mL Other Medication: None   Programming:  Type: Simple continuous. See pump readout for details.   Changes:  Medication Change: None at this point Rate Change: No change  in rate  Reported side-effects or adverse reactions: None reported  Effectiveness: Described as relatively effective, allowing for increase in activities of daily living (ADL) Clinically meaningful improvement in function (CMIF): Sustained CMIF goals met  Plan: Pump refill today  Pre-op Assessment:  [name] is a 52 y.o. (year old), male patient, seen today for interventional treatment. He  has a past surgical history that includes Back surgery; morphine pump; Tonsillectomy; Joint replacement; Shoulder arthroscopy with subacromial decompression, rotator cuff repair and bicep tendon repair (Right, 08/18/2013); Shoulder arthroscopy with subacromial decompression and bicep tendon repair (Left, 10/06/2013); and Laparoscopic gastric sleeve resection (N/A, 01/08/2015). [name] has a current medication list which includes the following prescription(s): polycarbophil, polyethylene glycol, PRESCRIPTION MEDICATION, baclofen, esomeprazole, gabapentin enacarbil, magnesium oxide, ondansetron, oxycodone hcl, oxycodone hcl, oxycodone hcl, and benefiber. His primarily concern today is the Back Pain  Initial Vital Signs:  Pulse/HCG Rate: (!) 108  Temp: 98.2 F (36.8 C) Resp: (!) 22 BP: 137/73 SpO2: 97 %  BMI: Estimated body mass index is 57.39 kg/m as calculated from the following:   Height as of this encounter: 5' 10" (1.778 m).   Weight as of this encounter: 400 lb (181.4 kg).  Risk Assessment: Allergies: Reviewed. He is allergic to tape.  Allergy Precautions: None required Coagulopathies: Reviewed. None identified.  Blood-thinner therapy: None at this time Active Infection(s): Reviewed. None identified. [name] is afebrile  Site Confirmation: [name] was asked to confirm the procedure and laterality before marking the site Procedure checklist: Completed Consent: Before the procedure and under the influence of no sedative(s), amnesic(s), or anxiolytics, the patient was informed of the  treatment options, risks and possible complications.   To fulfill our ethical and legal obligations, as recommended by the American Medical Association's Code of Ethics, I have informed the patient of my clinical impression; the nature and purpose of the treatment or procedure; the risks, benefits, and possible complications of the intervention; the alternatives, including doing nothing; the risk(s) and benefit(s) of the alternative treatment(s) or procedure(s); and the risk(s) and benefit(s) of doing nothing.  [name] was provided with information about the general risks and possible complications associated with most interventional procedures. These include, but are not limited to: failure to achieve desired goals, infection, bleeding, organ or nerve damage, allergic reactions, paralysis, and/or death.  In addition, he was informed of those risks and possible complications associated to this particular procedure, which include, but are not limited to: damage to the implant; failure to decrease pain; local, systemic, or serious CNS infections, intraspinal abscess with possible cord compression and paralysis, or life-threatening such as meningitis; bleeding; organ damage; nerve injury or damage with subsequent sensory, motor, and/or autonomic system dysfunction, resulting in transient or permanent pain, numbness, and/or weakness of one or several areas of the body; allergic reactions, either minor or major life-threatening, such as anaphylactic or anaphylactoid reactions.  Furthermore, [name] was informed of those risks and complications associated with the medications. These include, but are not limited to: allergic reactions (i.e.: anaphylactic or anaphylactoid reactions); endorphine suppression; bradycardia and/or hypotension; water retention and/or peripheral vascular relaxation leading to lower extremity edema and possible stasis ulcers; respiratory depression and/or shortness of breath; decreased  metabolic rate leading to weight gain; swelling or edema; medication-induced neural toxicity; particulate matter embolism and blood vessel occlusion with resultant organ, and/or nervous system infarction; and/or intrathecal granuloma formation with possible spinal cord compression and permanent paralysis.  Before refilling the pump Mr. Germano was informed that some of the medications used in the devise may not be FDA approved for such use and therefore it constitutes an off-label use of the medications.  Finally, he was informed that Medicine is not an exact science; therefore, there is also the possibility of unforeseen or unpredictable risks and/or possible complications that may result in a catastrophic outcome. The patient indicated having understood very clearly. We have given the patient no guarantees and we have made no promises. Enough time was given to the patient to ask questions, all of which were answered to the patient's satisfaction. Mr. Pratt has indicated that he wanted to continue with the procedure. Attestation: I, the ordering provider, attest that I have discussed with the patient the benefits, risks, side-effects, alternatives, likelihood of achieving goals, and potential problems during recovery for the procedure that I have provided informed consent. Date  Time: 04/01/2018 11:41 AM  Pre-Procedure Preparation:  Monitoring: As per clinic protocol. Respiration, ETCO2, SpO2, BP, heart rate and rhythm monitor placed and checked for adequate function Safety Precautions: Patient was assessed for positional comfort and pressure points before starting the procedure. Time-out: I initiated and conducted the "Time-out" before starting the procedure, as per protocol. The patient was asked to participate by confirming the accuracy of the "Time Out" information. Verification of the correct person, site, and procedure were performed and confirmed by me, the nursing staff, and the patient.  "Time-out" conducted as per Joint Commission's Universal Protocol (UP.01.01.01). Time: 1230  Description of Procedure:          Position: Supine Target Area: Central-port of intrathecal pump. Approach: Anterior, 90 degree angle approach. Area Prepped: Entire Area around the pump implant. Prepping solution: ChloraPrep (  2% chlorhexidine gluconate and 70% isopropyl alcohol) Safety Precautions: Aspiration looking for blood return was conducted prior to all injections. At no point did we inject any substances, as a needle was being advanced. No attempts were made at seeking any paresthesias. Safe injection practices and needle disposal techniques used. Medications properly checked for expiration dates. SDV (single dose vial) medications used. Description of the Procedure: Protocol guidelines were followed. Two nurses trained to do implant refills were present during the entire procedure. The refill medication was checked by both healthcare providers as well as the patient. The patient was included in the "Time-out" to verify the medication. The patient was placed in position. The pump was identified. The area was prepped in the usual manner. The sterile template was positioned over the pump, making sure the side-port location matched that of the pump. Both, the pump and the template were held for stability. The needle provided in the Medtronic Kit was then introduced thru the center of the template and into the central port. The pump content was aspirated and discarded volume documented. The new medication was slowly infused into the pump, thru the filter, making sure to avoid overpressure of the device. The needle was then removed and the area cleansed, making sure to leave some of the prepping solution back to take advantage of its long term bactericidal properties. The pump was interrogated and programmed to reflect the correct medication, volume, and dosage. The program was printed and taken to the  physician for approval. Once checked and signed by the physician, a copy was provided to the patient and another scanned into the EMR. Vitals:   04/01/18 1139 04/01/18 1142  BP:  137/73  Pulse: (!) 108   Resp: (!) 22   Temp: 98.2 F (36.8 C)   SpO2: 97%   Weight: (!) 400 lb (181.4 kg)   Height: 5' 10" (1.778 m)     Start Time: 1235 hrs. End Time: 1245 hrs. Materials & Medications: Medtronic Refill Kit Medication(s): Please see chart orders for details.  Imaging Guidance:          Type of Imaging Technique: None used Indication(s): N/A Exposure Time: No patient exposure Contrast: None used. Fluoroscopic Guidance: N/A Ultrasound Guidance: N/A Interpretation: N/A  Antibiotic Prophylaxis:   Anti-infectives (From admission, onward)   None     Indication(s): None identified  Post-operative Assessment:  Post-procedure Vital Signs:  Pulse/HCG Rate: (!) 108  Temp: 98.2 F (36.8 C) Resp: (!) 22 BP: 137/73 SpO2: 97 %  EBL: None  Complications: No immediate post-treatment complications observed by team, or reported by patient.  Note: The patient tolerated the entire procedure well. A repeat set of vitals were taken after the procedure and the patient was kept under observation following institutional policy, for this type of procedure. Post-procedural neurological assessment was performed, showing return to baseline, prior to discharge. The patient was provided with post-procedure discharge instructions, including a section on how to identify potential problems. Should any problems arise concerning this procedure, the patient was given instructions to immediately contact us, at any time, without hesitation. In any case, we plan to contact the patient by telephone for a follow-up status report regarding this interventional procedure.  Comments:  No additional relevant information.  Controlled Substance Pharmacotherapy Assessment REMS (Risk Evaluation and Mitigation Strategy)   Analgesic: We have completed the downward taper on the oral opioids.  Today we will start the patient back on oxycodone IR 10 mg 1 tablet p.o. every 6 hours (  40 mg/day of oxycodone) MME/day: 60 mg/day.  Dewayne Shorter, RN  04/01/2018 11:48 AM  Signed Safety precautions to be maintained throughout the outpatient stay will include: orient to surroundings, keep bed in low position, maintain call bell within reach at all times, provide assistance with transfer out of bed and ambulation.   Pharmacokinetics: N/A Pharmacodynamics: Desired effects: N/A Monitoring: [place] PMP: Online review of the past 1-monthperiod conducted. Compliant with practice rules and regulations Last UDS on record: Summary  Date Value Ref Range Status  01/26/2018 FINAL  Final    Comment:    ==================================================================== TOXASSURE SELECT 13 (MW) ==================================================================== Test                             Result       Flag       Units Drug Present and Declared for Prescription Verification   Morphine                       11033        EXPECTED   ng/mg creat   Normorphine                    333          EXPECTED   ng/mg creat    Potential sources of large amounts of morphine in the absence of    codeine include administration of morphine or use of heroin.    Normorphine is an expected metabolite of morphine. Drug Absent but Declared for Prescription Verification   Oxycodone                      Not Detected UNEXPECTED ng/mg creat ==================================================================== Test                      Result    Flag   Units      Ref Range   Creatinine              36               mg/dL      >=20 ==================================================================== Declared Medications:  The flagging and interpretation on this report are based on the  following declared medications.  Unexpected results may arise from   inaccuracies in the declared medications.  **Note: The testing scope of this panel includes these medications:  Morphine (MS Contin)  Oxycodone (Oxy IR)  **Note: The testing scope of this panel does not include following  reported medications:  Baclofen  Gabapentin  Magnesium  Omeprazole (Nexium)  Ondansetron (Zofran)  Oxybutynin (Ditropan)  Polyethylene Glycol (MiraLAX)  Promethazine (Phenergan)  Supplement ==================================================================== For clinical consultation, please call [phone] ====================================================================    UDS interpretation: Compliant          Medication Assessment Form: Reviewed. Patient indicates being compliant with therapy Treatment compliance: Compliant Risk Assessment Profile: Aberrant behavior: See prior evaluations. None observed or detected today Comorbid factors increasing risk of overdose: See prior notes. No additional risks detected today Opioid risk tool (ORT) (Total Score): 0 Personal History of Substance Abuse (SUD-Substance use disorder):  Alcohol: Negative  Illegal Drugs: Negative  Rx Drugs: Negative  ORT Risk Level calculation: Low Risk Risk of substance use disorder (SUD): Low Opioid Risk Tool - 04/01/18 1146      Family History of Substance Abuse   Alcohol  Negative  Illegal Drugs  Negative    Rx Drugs  Negative      Personal History of Substance Abuse   Alcohol  Negative    Illegal Drugs  Negative    Rx Drugs  Negative      Age   Age between 16-45 years   No      History of Preadolescent Sexual Abuse   History of Preadolescent Sexual Abuse  Negative or Male      Psychological Disease   Psychological Disease  Negative    Depression  Negative      Total Score   Opioid Risk Tool Scoring  0    Opioid Risk Interpretation  Low Risk      ORT Scoring interpretation table:  Score <3 = Low Risk for SUD  Score between 4-7 = Moderate Risk for SUD   Score >8 = High Risk for Opioid Abuse   Risk Mitigation Strategies:  Patient Counseling: Covered Patient-Prescriber Agreement (PPA): Present and active  Notification to other healthcare providers: Done  Pharmacologic Plan: No change in therapy, at this time.             Assessment  Primary Diagnosis & Pertinent Problem List: The primary encounter diagnosis was Chronic pain syndrome. Diagnoses of Failed back surgical syndrome, DDD (degenerative disc disease), lumbosacral, Presence of intrathecal pump, Encounter for adjustment or management of infusion pump, Chronic nausea s/p bariatric surgery, Chronic musculoskeletal pain, Muscle spasticity, Gastroesophageal reflux disease without esophagitis, Neurogenic pain, Therapeutic opioid-induced constipation (OIC), Pharmacologic therapy, Spondylosis without myelopathy or radiculopathy, lumbosacral region, Lumbar facet syndrome (Bilateral) (R>L), Lumbar spondylosis, and Chronic low back pain (Primary Area of Pain) (Bilateral) (R>L) were also pertinent to this visit.  Status Diagnosis  Unimproved Unimproved Unimproved 1. Chronic pain syndrome   2. Failed back surgical syndrome   3. DDD (degenerative disc disease), lumbosacral   4. Presence of intrathecal pump   5. Encounter for adjustment or management of infusion pump   6. Chronic nausea s/p bariatric surgery   7. Chronic musculoskeletal pain   8. Muscle spasticity   9. Gastroesophageal reflux disease without esophagitis   10. Neurogenic pain   11. Therapeutic opioid-induced constipation (OIC)   12. Pharmacologic therapy   13. Spondylosis without myelopathy or radiculopathy, lumbosacral region   14. Lumbar facet syndrome (Bilateral) (R>L)   15. Lumbar spondylosis   16. Chronic low back pain (Primary Area of Pain) (Bilateral) (R>L)     Problems updated and reviewed during this visit: Problem  Encounter for Adjustment Or Management of Infusion Pump   Plan of Care  Interventional  management options: Planned, scheduled, and/or pending:   Therapeutic/palliative intrathecal pump refill today.  Diagnostic bilateral lumbar facet block #2 under fluoroscopic guidance and IV sedation    Considering:   Diagnostic bilateral lumbar facet block #2  Palliative/therapeutic intrathecal pump refill and maintenance  Diagnostic left Genicular nerve block  Possible left Genicular nerve RFA  Possible bilateral lumbar facet RFA (Once his BMI is <35) Diagnostic caudal epidural steroid injection + epidurogram  Possible Racz procedure  Diagnostic bilateral intra-articular shoulder joint injection  Diagnostic bilateral suprascapular nerve block  Possible bilateral suprascapular nerve RFA  Diagnostic interlaminar ESI versus transforaminal ESI    Palliative PRN treatment(s):   Palliative/therapeutic intrathecal pump refill and maintenance  Diagnostic bilateral lumbar facet blocks    Imaging Orders  No imaging studies ordered today    Procedure Orders     PUMP REFILL     PUMP REFILL       PUMP REFILL     PUMP REPROGRAM     LUMBAR FACET(MEDIAL BRANCH NERVE BLOCK) MBNB  Medications ordered for procedure: Meds ordered this encounter  Medications  . ondansetron (ZOFRAN-ODT) 8 MG disintegrating tablet    Sig: Take 1 tablet (8 mg total) by mouth every 8 (eight) hours as needed for nausea or vomiting.    Dispense:  90 tablet    Refill:  2    Do not place medication on "Automatic Refill". Fill one day early if pharmacy is closed on scheduled refill date.  . baclofen (LIORESAL) 10 MG tablet    Sig: Take 1 tablet (10 mg total) by mouth 4 (four) times daily.    Dispense:  120 tablet    Refill:  2    Do not place medication on "Automatic Refill". Fill one day early if pharmacy is closed on scheduled refill date.  . esomeprazole (NEXIUM) 40 MG capsule    Sig: Take 1 capsule (40 mg total) by mouth 2 (two) times daily.    Dispense:  60 capsule    Refill:  2    Do not place medication on  "Automatic Refill". Fill one day early if pharmacy is closed on scheduled refill date.  . Magnesium Oxide 500 MG CAPS    Sig: Take 1 capsule (500 mg total) by mouth 2 (two) times daily at 8 am and 10 pm.    Dispense:  60 capsule    Refill:  2    Do not place medication on "Automatic Refill". Fill one day early if pharmacy is closed on scheduled refill date.  . Gabapentin Enacarbil (HORIZANT) 600 MG TBCR    Sig: Take 1 tablet (600 mg total) by mouth 2 (two) times daily.    Dispense:  60 tablet    Refill:  2    Do not place medication on "Automatic Refill". Fill one day early if pharmacy is closed on scheduled refill date.  . Wheat Dextrin (BENEFIBER) POWD    Sig: Take 6 g by mouth 3 (three) times daily before meals. (2 tsp = 6 g). Stir into 8 oz of water, or sprinkle over food.    Dispense:  730 g    Refill:  2    OTC product. Prescription is to instruct patient on dosage and frequency.  . Oxycodone HCl 10 MG TABS    Sig: Take 1 tablet (10 mg total) by mouth every 6 (six) hours as needed. Must last 30 days. Max: 4/day.    Dispense:  120 tablet    Refill:  0    [place] STOP ACT - Not applicable. Fill one day early if pharmacy is closed on scheduled refill date. Do not fill until: 05/31/18. Must last 30 days. To last until: 06/30/18.  Marland Kitchen Oxycodone HCl 10 MG TABS    Sig: Take 1 tablet (10 mg total) by mouth every 6 (six) hours as needed. Must last 30 days. Max: 4/day.    Dispense:  120 tablet    Refill:  0    [place] STOP ACT - Not applicable. Fill one day early if pharmacy is closed on scheduled refill date. Do not fill until: 05/01/18. Must last 30 days. To last until: 05/31/18.  Marland Kitchen Oxycodone HCl 10 MG TABS    Sig: Take 1 tablet (10 mg total) by mouth every 6 (six) hours as needed. Must last 30 days. Max: 4/day.    Dispense:  120 tablet    Refill:  0  [place] STOP ACT - Not applicable. Fill one day early if pharmacy is closed on scheduled refill date. Do not fill until: 04/01/18. Must last 30 days. To  last until: 05/01/18.   Medications administered: Iris B. [place] had no medications administered during this visit.  See the medical record for exact dosing, route, and time of administration.  Disposition: Discharge home  Discharge Date & Time: 04/01/2018; 1300 hrs.   Physician-requested Follow-up: Return for Pump Refill (as per pump program) (Max:3mo), Procedure (w/ sedation): (B) L-FCT BLK #2.  Future Appointments  Date Time Provider Department Center  06/01/2018 12:30 PM Naveira, Francisco, MD [facility] None  06/30/2018  1:15 PM Naveira, Francisco, MD [facility] None   Primary Care Physician: Harris, William, MD Location: [facility] Note by: Francisco A Naveira, MD Date: 04/01/2018; Time: 1:24 PM  Disclaimer:  Medicine is not an exact science. The only guarantee in medicine is that nothing is guaranteed. It is important to note that the decision to proceed with this intervention was based on the information collected from the patient. The Data and conclusions were drawn from the patient's questionnaire, the interview, and the physical examination. Because the information was provided in large part by the patient, it cannot be guaranteed that it has not been purposely or unconsciously manipulated. Every effort has been made to obtain as much relevant data as possible for this evaluation. It is important to note that the conclusions that lead to this procedure are derived in large part from the available data. Always take into account that the treatment will also be dependent on availability of resources and existing treatment guidelines, considered by other Pain Management Practitioners as being common knowledge and practice, at the time of the intervention. For Medico-Legal purposes, it is also important to point out that variation in procedural techniques and pharmacological choices are the acceptable norm. The indications, contraindications, technique, and  results of the above procedure should only be interpreted and judged by a Board-Certified Interventional Pain Specialist with extensive familiarity and expertise in the same exact procedure and technique. 

## 2018-04-01 NOTE — Progress Notes (Signed)
Safety precautions to be maintained throughout the outpatient stay will include: orient to surroundings, keep bed in low position, maintain call bell within reach at all times, provide assistance with transfer out of bed and ambulation.  

## 2018-04-02 ENCOUNTER — Telehealth: Payer: Self-pay | Admitting: Pain Medicine

## 2018-04-02 ENCOUNTER — Telehealth: Payer: Self-pay

## 2018-04-02 NOTE — Telephone Encounter (Signed)
This is [facility] [employer]. They handle the PA

## 2018-04-02 NOTE — Telephone Encounter (Signed)
Pt called and left a voicemail stating that he needs a PA for his oxycodone 20mg 

## 2018-04-02 NOTE — Telephone Encounter (Signed)
Post pump refill.  States his pump is doing fine.

## 2018-04-07 LAB — TOXASSURE SELECT 13 (MW), URINE

## 2018-04-09 MED FILL — Medication: INTRATHECAL | Qty: 1 | Status: AC

## 2018-04-12 ENCOUNTER — Telehealth: Payer: Self-pay

## 2018-04-12 NOTE — Telephone Encounter (Signed)
Pt wants to inform Dr Laban EmperorNaveira that he is having no relief from the pump or the other meds, Not sure what Dr. Laban EmperorNaveira wants to do next. Please have Dr Laban EmperorNaveira call him. Pt aware Dr Laban EmperorNaveira is out of the office until Monday.

## 2018-04-13 NOTE — Telephone Encounter (Signed)
Dr Shauna HughNaveria remains on vacation. Will ask when he comes back.

## 2018-04-16 NOTE — Telephone Encounter (Signed)
Ask pm-admin to please schedule an appt for this patient.

## 2018-04-18 NOTE — Progress Notes (Signed)
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 04/21/18 1134  BP:  (!) 156/75  Pulse:  (!) 101  Resp:  (!) 22  Temp:  98.2 F (36.8 C)  SpO2:  95%  Weight: (!) 400 lb (181.4 kg)   Height: '5\' 10"'  (1.778 m)    BMI Assessment: Estimated body mass index is 57.39 kg/m as calculated from the following:   Height as of this encounter: '5\' 10"'  (1.778 m).   Weight as of this encounter: 400 lb (181.4 kg).  BMI interpretation table: BMI level Category Range association with higher incidence of chronic pain  <18 kg/m2 Underweight   18.5-24.9 kg/m2 Ideal body weight   25-29.9 kg/m2 Overweight Increased incidence by 20%  30-34.9 kg/m2 Obese (Class I) Increased incidence by 68%  35-39.9 kg/m2 Severe obesity (Class II) Increased incidence by 136%  >40 kg/m2  Extreme obesity (Class III) Increased incidence by 254%   Patient's current BMI Ideal Body weight  Body mass index is 57.39 kg/m. Ideal body weight: 73 kg (160 lb 15 oz) Adjusted ideal body weight: 116.4 kg (256 lb 9 oz)   BMI Readings from Last 4 Encounters:  04/21/18 57.39 kg/m  04/01/18 57.39 kg/m  01/26/18 49.79 kg/m  12/24/17 49.51 kg/m   Wt Readings from Last 4 Encounters:  04/21/18 (!) 400 lb (181.4 kg)  04/01/18 (!) 400 lb (181.4 kg)  01/26/18 (!) 347 lb (157.4 kg)  12/24/17 (!) 350 lb (158.8 kg)  Psych/Mental status: Alert, oriented x 3 (person, place, & time)       Eyes: PERLA Respiratory: No evidence of acute respiratory distress  Cervical Spine Area Exam  Skin & Axial Inspection: No masses, redness, edema, swelling, or associated skin lesions Alignment: Symmetrical Functional ROM: Unrestricted ROM      Stability: No instability detected Muscle Tone/Strength: Functionally intact. No obvious neuro-muscular anomalies detected. Sensory (Neurological): Unimpaired Palpation: No palpable anomalies              Upper Extremity (UE) Exam    Side: Right upper extremity  Side: Left upper extremity  Skin & Extremity Inspection: Skin color, temperature, and hair growth are WNL. No peripheral edema or cyanosis. No masses, redness, swelling, asymmetry, or associated skin lesions. No contractures.  Skin & Extremity Inspection: Skin color, temperature, and hair growth are WNL. No peripheral edema or cyanosis. No masses, redness, swelling, asymmetry, or associated skin lesions. No contractures.  Functional ROM: Unrestricted ROM          Functional ROM: Unrestricted ROM          Muscle Tone/Strength: Functionally intact. No obvious neuro-muscular anomalies detected.  Muscle Tone/Strength: Functionally intact. No obvious neuro-muscular anomalies detected.  Sensory (Neurological): Unimpaired          Sensory (Neurological): Unimpaired          Palpation: No palpable anomalies               Palpation: No palpable anomalies              Provocative Test(s):  Phalen's test: deferred Tinel's test: deferred Apley's scratch test (touch opposite shoulder):  Action 1 (Across chest): deferred Action 2 (Overhead): deferred Action 3 (LB reach): deferred   Provocative Test(s):  Phalen's test: deferred Tinel's  test: deferred Apley's scratch test (touch opposite shoulder):  Action 1 (Across chest): deferred Action 2 (Overhead): deferred Action 3 (LB reach): deferred    Thoracic Spine Area Exam  Skin & Axial Inspection: No masses, redness, or swelling Alignment: Symmetrical Functional ROM: Unrestricted ROM Stability: No instability detected Muscle Tone/Strength: Functionally intact. No obvious neuro-muscular anomalies detected. Sensory (Neurological): Unimpaired Muscle strength & Tone: No palpable anomalies  Lumbar Spine Area Exam  Skin & Axial Inspection: No masses, redness, or swelling Alignment: Symmetrical Functional ROM: Unrestricted ROM       Stability: No instability detected Muscle Tone/Strength: Functionally intact. No obvious neuro-muscular anomalies detected. Sensory (Neurological): Unimpaired Palpation: No palpable anomalies       Provocative Tests: Hyperextension/rotation test: deferred today       Lumbar quadrant test (Kemp's test): deferred today       Lateral bending test: deferred today       Patrick's Maneuver: deferred today                   FABER* test: deferred today                   S-I anterior distraction/compression test: deferred today         S-I lateral compression test: deferred today         S-I Thigh-thrust test: deferred today         S-I Gaenslen's test: deferred today         *(Flexion, ABduction and External Rotation)  Gait & Posture Assessment  Ambulation: Unassisted Gait: Relatively normal for age and body habitus Posture: WNL   Lower Extremity Exam    Side: Right lower extremity  Side: Left lower extremity  Stability: No  instability observed          Stability: No instability observed          Skin & Extremity Inspection: Skin color, temperature, and hair growth are WNL. No peripheral edema or cyanosis. No masses, redness, swelling, asymmetry, or associated skin lesions. No contractures.  Skin & Extremity Inspection: Skin color, temperature, and hair growth are WNL. No peripheral edema or cyanosis. No masses, redness, swelling, asymmetry, or associated skin lesions. No contractures.  Functional ROM: Unrestricted ROM                  Functional ROM: Unrestricted ROM                  Muscle Tone/Strength: Functionally intact. No obvious neuro-muscular anomalies detected.  Muscle Tone/Strength: Functionally intact. No obvious neuro-muscular anomalies detected.  Sensory (Neurological): Unimpaired        Sensory (Neurological): Unimpaired        DTR: Patellar: deferred today Achilles: deferred today Plantar: deferred today  DTR: Patellar: deferred today Achilles: deferred today Plantar: deferred today  Palpation: No palpable anomalies  Palpation: No palpable anomalies   Assessment  Primary Diagnosis & Pertinent Problem List: The primary encounter diagnosis was Chronic pain syndrome. Diagnoses of Chronic low back pain (Primary Area of Pain) (Bilateral) (R>L), DDD (degenerative disc disease), lumbosacral, Failed back surgical syndrome, Lumbar facet syndrome (Bilateral) (R>L), Lumbar spondylosis, Encounter for adjustment or management of infusion pump, Encounter for interrogation of infusion pump, and Presence of intrathecal pump were also pertinent to this visit.  Status Diagnosis  Persistent Persistent Persistent 1. Chronic pain syndrome   2. Chronic low back pain (Primary Area of Pain) (Bilateral) (R>L)   3. DDD (degenerative disc disease), lumbosacral  4. Failed back surgical syndrome   5. Lumbar facet syndrome (Bilateral) (R>L)   6. Lumbar spondylosis   7. Encounter for adjustment or management of infusion  pump   8. Encounter for interrogation of infusion pump   9. Presence of intrathecal pump     Problems updated and reviewed during this visit: No problems updated. Plan of Care  Pharmacotherapy (Medications Ordered): No orders of the defined types were placed in this encounter.  Medications administered today: [name] had no medications administered during this visit.   Procedure Orders     PUMP REPROGRAM Lab Orders  No laboratory test(s) ordered today   Imaging Orders  No imaging studies ordered today   Referral Orders  No referral(s) requested today   Interventional management options: Planned, scheduled, and/or pending:   Therapeutic/palliative 20% increase in intrathecal pump rate today.  Diagnostic bilateral lumbar facet block #2 under fluoroscopic guidance and IV sedation    Considering:   Diagnostic bilateral lumbar facet block #2 Palliative/therapeutic intrathecal pump refill and maintenance Diagnostic left Genicular nerve block Possible left Genicular nerve RFA Possible bilateral lumbar facet RFA(Once his BMI is <35) Diagnostic caudal epidural steroid injection+epidurogram Possible Racz procedure Diagnostic bilateral intra-articular shoulder joint injection Diagnostic bilateral suprascapular nerve block Possible bilateral suprascapular nerve RFA Diagnostic interlaminar ESI versus transforaminal ESI   Palliative PRN treatment(s):   Palliative/therapeutic intrathecal pump refill and maintenance Diagnostic bilateral lumbar facet blocks    Provider-requested follow-up: Return for Pump Refill (as per pump program) (Max:90mo.  Future Appointments  Date Time Provider [place] 05/04/2018 10:15 AM NMilinda Pointer MD [facility] None  05/25/2018 11:15 AM NMilinda Pointer MD [facility] None  06/30/2018  1:15 PM [facility]   Primary Care Physician: HShirline Frees MD [facility] Note by: FGaspar Cola MD Date: 04/21/2018; Time: 1:36 PM

## 2018-04-21 ENCOUNTER — Ambulatory Visit: Payer: No Typology Code available for payment source | Attending: Pain Medicine | Admitting: Pain Medicine

## 2018-04-21 ENCOUNTER — Encounter: Payer: Self-pay | Admitting: Pain Medicine

## 2018-04-21 ENCOUNTER — Other Ambulatory Visit: Payer: Self-pay

## 2018-04-21 VITALS — BP 156/75 | HR 101 | Temp 98.2°F | Resp 22 | Ht 70.0 in | Wt >= 6400 oz

## 2018-04-21 DIAGNOSIS — Z451 Encounter for adjustment and management of infusion pump: Secondary | ICD-10-CM | POA: Diagnosis not present

## 2018-04-21 DIAGNOSIS — G4733 Obstructive sleep apnea (adult) (pediatric): Secondary | ICD-10-CM | POA: Diagnosis not present

## 2018-04-21 DIAGNOSIS — Z87891 Personal history of nicotine dependence: Secondary | ICD-10-CM | POA: Diagnosis not present

## 2018-04-21 DIAGNOSIS — M25562 Pain in left knee: Secondary | ICD-10-CM | POA: Diagnosis not present

## 2018-04-21 DIAGNOSIS — M5137 Other intervertebral disc degeneration, lumbosacral region: Secondary | ICD-10-CM | POA: Diagnosis not present

## 2018-04-21 DIAGNOSIS — M961 Postlaminectomy syndrome, not elsewhere classified: Secondary | ICD-10-CM

## 2018-04-21 DIAGNOSIS — M545 Low back pain, unspecified: Secondary | ICD-10-CM

## 2018-04-21 DIAGNOSIS — M47896 Other spondylosis, lumbar region: Secondary | ICD-10-CM | POA: Diagnosis not present

## 2018-04-21 DIAGNOSIS — G8929 Other chronic pain: Secondary | ICD-10-CM

## 2018-04-21 DIAGNOSIS — M47816 Spondylosis without myelopathy or radiculopathy, lumbar region: Secondary | ICD-10-CM

## 2018-04-21 DIAGNOSIS — Z79899 Other long term (current) drug therapy: Secondary | ICD-10-CM | POA: Diagnosis not present

## 2018-04-21 DIAGNOSIS — D751 Secondary polycythemia: Secondary | ICD-10-CM | POA: Insufficient documentation

## 2018-04-21 DIAGNOSIS — K219 Gastro-esophageal reflux disease without esophagitis: Secondary | ICD-10-CM | POA: Insufficient documentation

## 2018-04-21 DIAGNOSIS — G894 Chronic pain syndrome: Secondary | ICD-10-CM

## 2018-04-21 DIAGNOSIS — M549 Dorsalgia, unspecified: Secondary | ICD-10-CM | POA: Diagnosis present

## 2018-04-21 DIAGNOSIS — Z9884 Bariatric surgery status: Secondary | ICD-10-CM | POA: Insufficient documentation

## 2018-04-21 DIAGNOSIS — Z79891 Long term (current) use of opiate analgesic: Secondary | ICD-10-CM | POA: Insufficient documentation

## 2018-04-21 DIAGNOSIS — M51379 Other intervertebral disc degeneration, lumbosacral region without mention of lumbar back pain or lower extremity pain: Secondary | ICD-10-CM

## 2018-04-21 DIAGNOSIS — Z978 Presence of other specified devices: Secondary | ICD-10-CM

## 2018-04-21 NOTE — Progress Notes (Signed)
Nursing Pain Medication Assessment:  Safety precautions to be maintained throughout the outpatient stay will include: orient to surroundings, keep bed in low position, maintain call bell within reach at all times, provide assistance with transfer out of bed and ambulation.  Medication Inspection Compliance: Pill count conducted under aseptic conditions, in front of the patient. Neither the pills nor the bottle was removed from the patient's sight at any time. Once count was completed pills were immediately returned to the patient in their original bottle.  Medication: Oxycodone IR Pill/Patch Count: 59 of 120 pills remain Pill/Patch Appearance: Markings consistent with prescribed medication Bottle Appearance: Standard pharmacy container. Clearly labeled. Filled Date: 64 / 20 / 2019 Last Medication intake:  Today

## 2018-04-30 DIAGNOSIS — M549 Dorsalgia, unspecified: Secondary | ICD-10-CM

## 2018-05-04 ENCOUNTER — Ambulatory Visit: Payer: Self-pay | Admitting: Pain Medicine

## 2018-05-10 ENCOUNTER — Other Ambulatory Visit: Payer: Self-pay

## 2018-05-10 MED ORDER — PAIN MANAGEMENT IT PUMP REFILL: 1.0000 | Freq: Once | INTRATHECAL | 0 refills | Status: AC

## 2018-05-20 ENCOUNTER — Telehealth: Payer: Self-pay | Admitting: Pain Medicine

## 2018-05-20 NOTE — Telephone Encounter (Signed)
Pt called and stated that he would like to talk about having his pain pump medication increased.

## 2018-05-20 NOTE — Telephone Encounter (Signed)
Reminded patient that he was coming for a IT pump refill and he could discuss with Md about an increase at that time.

## 2018-05-25 ENCOUNTER — Ambulatory Visit: Payer: Medicare Other | Attending: Pain Medicine | Admitting: Pain Medicine

## 2018-05-25 ENCOUNTER — Encounter: Payer: Self-pay | Admitting: Pain Medicine

## 2018-05-25 VITALS — BP 145/76 | HR 92 | Temp 97.9°F | Resp 16 | Ht 70.0 in | Wt >= 6400 oz

## 2018-05-25 DIAGNOSIS — M7918 Myalgia, other site: Secondary | ICD-10-CM

## 2018-05-25 DIAGNOSIS — K219 Gastro-esophageal reflux disease without esophagitis: Secondary | ICD-10-CM | POA: Diagnosis present

## 2018-05-25 DIAGNOSIS — T402X5A Adverse effect of other opioids, initial encounter: Secondary | ICD-10-CM | POA: Insufficient documentation

## 2018-05-25 DIAGNOSIS — M792 Neuralgia and neuritis, unspecified: Secondary | ICD-10-CM | POA: Diagnosis present

## 2018-05-25 DIAGNOSIS — M5410 Radiculopathy, site unspecified: Secondary | ICD-10-CM | POA: Diagnosis present

## 2018-05-25 DIAGNOSIS — G8929 Other chronic pain: Secondary | ICD-10-CM | POA: Diagnosis present

## 2018-05-25 DIAGNOSIS — M1611 Unilateral primary osteoarthritis, right hip: Secondary | ICD-10-CM | POA: Insufficient documentation

## 2018-05-25 DIAGNOSIS — M545 Low back pain, unspecified: Secondary | ICD-10-CM

## 2018-05-25 DIAGNOSIS — M961 Postlaminectomy syndrome, not elsewhere classified: Secondary | ICD-10-CM | POA: Diagnosis present

## 2018-05-25 DIAGNOSIS — G894 Chronic pain syndrome: Secondary | ICD-10-CM | POA: Diagnosis present

## 2018-05-25 DIAGNOSIS — M62838 Other muscle spasm: Secondary | ICD-10-CM | POA: Insufficient documentation

## 2018-05-25 DIAGNOSIS — R110 Nausea: Secondary | ICD-10-CM | POA: Insufficient documentation

## 2018-05-25 DIAGNOSIS — M153 Secondary multiple arthritis: Secondary | ICD-10-CM | POA: Diagnosis present

## 2018-05-25 DIAGNOSIS — Z451 Encounter for adjustment and management of infusion pump: Secondary | ICD-10-CM

## 2018-05-25 DIAGNOSIS — Z978 Presence of other specified devices: Secondary | ICD-10-CM | POA: Insufficient documentation

## 2018-05-25 DIAGNOSIS — Z6841 Body Mass Index (BMI) 40.0 and over, adult: Secondary | ICD-10-CM | POA: Diagnosis present

## 2018-05-25 DIAGNOSIS — M47816 Spondylosis without myelopathy or radiculopathy, lumbar region: Secondary | ICD-10-CM | POA: Insufficient documentation

## 2018-05-25 DIAGNOSIS — K5903 Drug induced constipation: Secondary | ICD-10-CM | POA: Insufficient documentation

## 2018-05-25 MED ORDER — MAGNESIUM OXIDE -MG SUPPLEMENT 500 MG PO CAPS: 1.0000 | ORAL_CAPSULE | Freq: Two times a day (BID) | ORAL | 1 refills | Status: DC

## 2018-05-25 MED ORDER — ESOMEPRAZOLE MAGNESIUM 40 MG PO CPDR: 40.0000 mg | DELAYED_RELEASE_CAPSULE | Freq: Two times a day (BID) | ORAL | 1 refills | Status: DC

## 2018-05-25 MED ORDER — ONDANSETRON 8 MG PO TBDP: 8.0000 mg | ORAL_TABLET | Freq: Three times a day (TID) | ORAL | 0 refills | Status: DC | PRN

## 2018-05-25 MED ORDER — GABAPENTIN ENACARBIL ER 600 MG PO TBCR: 600.0000 mg | EXTENDED_RELEASE_TABLET | Freq: Two times a day (BID) | ORAL | 0 refills | Status: DC

## 2018-05-25 MED ORDER — OXYCODONE HCL 10 MG PO TABS: 10.0000 mg | ORAL_TABLET | Freq: Four times a day (QID) | ORAL | 0 refills | Status: DC | PRN

## 2018-05-25 MED ORDER — BACLOFEN 10 MG PO TABS: 10.0000 mg | ORAL_TABLET | Freq: Four times a day (QID) | ORAL | 1 refills | Status: DC

## 2018-05-25 MED ORDER — BENEFIBER PO POWD: 6.0000 g | Freq: Three times a day (TID) | ORAL | 1 refills | Status: DC

## 2018-05-25 NOTE — Progress Notes (Signed)
Patient's Name: [name]  MRN: [id number]  Referring Provider: Shirline Frees, MD  DOB: Jan 20, 1966  PCP: Shirline Frees, MD  DOS: 05/25/2018  Note by: Gaspar Cola, MD  Service setting: Ambulatory outpatient  Specialty: Interventional Pain Management  Patient type: Established  Location: [facility] (AMB) Pain Management Facility  Visit type: Interventional Procedure   Primary Reason for Visit: Interventional Pain Management Treatment. CC: Back Pain (lower lumbar bilatera) and Leg Pain (bilateral )  Procedure:          Intrathecal Drug Delivery System (IDDS):  Type: Reservoir Refill [id number]) No rate change Region: Abdominal Laterality: Left  NOTE: Today we will be changing the medication and the intrathecal pump from morphine 20 mg/mL to fentanyl.  We will continue with the bupivacaine.  Type of Pump: Medtronic Synchromed II (MRI-compatible) Delivery Route: Intrathecal Type of Pain Treated: Neuropathic/Nociceptive Primary Medication Class: Opioid/opiate  Medication, Concentration, Infusion Program, & Delivery Rate: Please see scanned programming printout.   Indications: 1. Chronic pain syndrome   2. Failed back surgical syndrome   3. Chronic low back pain (Primary Area of Pain) (Bilateral) (R>L)   4. Lumbar spondylosis   5. Lumbar facet syndrome (Bilateral) (R>L)   6. Chronic lower extremity radicular pain   7. Presence of intrathecal pump   8. Encounter for adjustment or management of infusion pump   9. Morbid obesity with BMI of 50.0-59.9, adult ([place])   10. Secondary osteoarthritis of multiple sites   11. Osteoarthritis of hip (Right)   12. Chronic nausea s/p bariatric surgery   13. Chronic musculoskeletal pain   14. Muscle spasticity   15. Gastroesophageal reflux disease without esophagitis   16. Neurogenic pain   17. Therapeutic opioid-induced constipation (OIC)    Pain Assessment: Self-Reported Pain Score: 6 /10             Reported level is compatible with  observation.        Intrathecal Pump Therapy Assessment  Manufacturer: Medtronic Synchromed II Type: Programmable Volume: 40 mL reservoir MRI compatibility: Yes   Drug content:  Primary Medication Class: Opioid Primary Medication: PF-Fentanyl (2,000.0 mcg/ml)  Secondary Medication: PF-Bupivacaine (20 mg/mL)  Other Medication: see pump readout   Programming:  Type: Simple continuous. See pump readout for details.   Changes:  Medication Change: Today we will be changing the medication and the intrathecal pump from PF-morphine 20 mg/mL to PF-fentanyl 2000 mcg/mL.  We will continue with the PF-bupivacaine 20 mg/mL. Rate Change: Taking into consideration the cumbersome, today we will change the rate so that we begin with a delivery of PF-fentanyl 657mg/day, which will also carry 6 mg/day of the PF-bupivacaine.  Reported side-effects or adverse reactions: None reported  Effectiveness: Described as relatively effective, allowing for increase in activities of daily living (ADL) Clinically meaningful improvement in function (CMIF): Sustained CMIF goals met  Plan: Pump refill today w/ Analysis and programming.  Today we will be changing his opioid analgesics from morphine to fentanyl.  Pre-op Assessment:  [name] a 53y.o. (year old), male patient, seen today for interventional treatment. He  has a past surgical history that includes Back surgery; morphine pump; Tonsillectomy; Joint replacement; Shoulder arthroscopy with subacromial decompression, rotator cuff repair and bicep tendon repair (Right, 08/18/2013); Shoulder arthroscopy with subacromial decompression and bicep tendon repair (Left, 10/06/2013); and Laparoscopic gastric sleeve resection (N/A, 01/08/2015). [name] a current medication list which includes the following prescription(s): gnp milk of magnesia, oxycodone hcl, polycarbophil, PRESCRIPTION MEDICATION, promethazine, baclofen,  esomeprazole, gabapentin enacarbil,  magnesium oxide, ondansetron, oxycodone hcl, oxycodone hcl, and benefiber. His primarily concern today is the Back Pain (lower lumbar bilatera) and Leg Pain (bilateral )  Initial Vital Signs:  Pulse/HCG Rate: 92  Temp: 97.9 F (36.6 C) Resp: 16 BP: (!) 145/76 SpO2: 92 %  BMI: Estimated body mass index is 58.83 kg/m as calculated from the following:   Height as of this encounter: _0  (1.778 m).   Weight as of this encounter: 410 lb (186 kg).  Risk Assessment: Allergies: Reviewed. He is allergic to tape.  Allergy Precautions: None required Coagulopathies: Reviewed. None identified.  Blood-thinner therapy: None at this time Active Infection(s): Reviewed. None identified. [name] is afebrile  Site Confirmation: [name] was asked to confirm the procedure and laterality before marking the site Procedure checklist: Completed Consent: Before the procedure and under the influence of no sedative(s), amnesic(s), or anxiolytics, the patient was informed of the treatment options, risks and possible complications. To fulfill our ethical and legal obligations, as recommended by the American Medical Association's Code of Ethics, I have informed the patient of my clinical impression; the nature and purpose of the treatment or procedure; the risks, benefits, and possible complications of the intervention; the alternatives, including doing nothing; the risk(s) and benefit(s) of the alternative treatment(s) or procedure(s); and the risk(s) and benefit(s) of doing nothing.  [name] was provided with information about the general risks and possible complications associated with most interventional procedures. These include, but are not limited to: failure to achieve desired goals, infection, bleeding, organ or nerve damage, allergic reactions, paralysis, and/or death.  In addition, he was informed of those risks and possible complications associated to this particular procedure, which include,  but are not limited to: damage to the implant; failure to decrease pain; local, systemic, or serious CNS infections, intraspinal abscess with possible cord compression and paralysis, or life-threatening such as meningitis; bleeding; organ damage; nerve injury or damage with subsequent sensory, motor, and/or autonomic system dysfunction, resulting in transient or permanent pain, numbness, and/or weakness of one or several areas of the body; allergic reactions, either minor or major life-threatening, such as anaphylactic or anaphylactoid reactions.  Furthermore, [name] was informed of those risks and complications associated with the medications. These include, but are not limited to: allergic reactions (i.e.: anaphylactic or anaphylactoid reactions); endorphine suppression; bradycardia and/or hypotension; water retention and/or peripheral vascular relaxation leading to lower extremity edema and possible stasis ulcers; respiratory depression and/or shortness of breath; decreased metabolic rate leading to weight gain; swelling or edema; medication-induced neural toxicity; particulate matter embolism and blood vessel occlusion with resultant organ, and/or nervous system infarction; and/or intrathecal granuloma formation with possible spinal cord compression and permanent paralysis.  Before refilling the pump [name] was informed that some of the medications used in the devise may not be FDA approved for such use and therefore it constitutes an off-label use of the medications.  Finally, he was informed that Medicine is not an exact science; therefore, there is also the possibility of unforeseen or unpredictable risks and/or possible complications that may result in a catastrophic outcome. The patient indicated having understood very clearly. We have given the patient no guarantees and we have made no promises. Enough time was given to the patient to ask questions, all of which were answered to the  patient's satisfaction. [name] has indicated that he wanted to continue with the procedure. Attestation: I, the ordering provider, attest that I have discussed with  the patient the benefits, risks, side-effects, alternatives, likelihood of achieving goals, and potential problems during recovery for the procedure that I have provided informed consent. Date  Time: 05/25/2018 11:20 AM  Pre-Procedure Preparation:  Monitoring: As per clinic protocol. Respiration, ETCO2, SpO2, BP, heart rate and rhythm monitor placed and checked for adequate function Safety Precautions: Patient was assessed for positional comfort and pressure points before starting the procedure. Time-out: I initiated and conducted the "Time-out" before starting the procedure, as per protocol. The patient was asked to participate by confirming the accuracy of the "Time Out" information. Verification of the correct person, site, and procedure were performed and confirmed by me, the nursing staff, and the patient. "Time-out" conducted as per Joint Commission's Universal Protocol (UP.01.01.01). Time: 1126  Description of Procedure:          Position: Supine Target Area: Central-port of intrathecal pump. Approach: Anterior, 90 degree angle approach. Area Prepped: Entire Area around the pump implant. Prepping solution: ChloraPrep (2% chlorhexidine gluconate and 70% isopropyl alcohol) Safety Precautions: Aspiration looking for blood return was conducted prior to all injections. At no point did we inject any substances, as a needle was being advanced. No attempts were made at seeking any paresthesias. Safe injection practices and needle disposal techniques used. Medications properly checked for expiration dates. SDV (single dose vial) medications used. Description of the Procedure: Protocol guidelines were followed. Two nurses trained to do implant refills were present during the entire procedure. The refill medication was checked by both  healthcare providers as well as the patient. The patient was included in the "Time-out" to verify the medication. The patient was placed in position. The pump was identified. The area was prepped in the usual manner. The sterile template was positioned over the pump, making sure the side-port location matched that of the pump. Both, the pump and the template were held for stability. The needle provided in the Medtronic Kit was then introduced thru the center of the template and into the central port. The pump content was aspirated and discarded volume documented. The new medication was slowly infused into the pump, thru the filter, making sure to avoid overpressure of the device. The needle was then removed and the area cleansed, making sure to leave some of the prepping solution back to take advantage of its long term bactericidal properties. The pump was interrogated and programmed to reflect the correct medication, volume, and dosage. The program was printed and taken to the physician for approval. Once checked and signed by the physician, a copy was provided to the patient and another scanned into the EMR. Vitals:   05/25/18 1117  BP: (!) 145/76  Pulse: 92  Resp: 16  Temp: 97.9 F (36.6 C)  TempSrc: Oral  SpO2: 92%  Weight: (!) 410 lb (186 kg)  Height: _0  (1.778 m)    Start Time:   hrs. End Time:   hrs. Materials & Medications: Medtronic Refill Kit Medication(s): Please see chart orders for details.  Imaging Guidance:          Type of Imaging Technique: None used Indication(s): N/A Exposure Time: No patient exposure Contrast: None used. Fluoroscopic Guidance: N/A Ultrasound Guidance: N/A Interpretation: N/A  Antibiotic Prophylaxis:   Anti-infectives (From admission, onward)   None     Indication(s): None identified  Post-operative Assessment:  Post-procedure Vital Signs:  Pulse/HCG Rate: 92  Temp: 97.9 F (36.6 C) Resp: 16 BP: (!) 145/76 SpO2: 92 %  EBL:  None  Complications: No immediate  post-treatment complications observed by team, or reported by patient.  Note: The patient tolerated the entire procedure well. A repeat set of vitals were taken after the procedure and the patient was kept under observation following institutional policy, for this type of procedure. Post-procedural neurological assessment was performed, showing return to baseline, prior to discharge. The patient was provided with post-procedure discharge instructions, including a section on how to identify potential problems. Should any problems arise concerning this procedure, the patient was given instructions to immediately contact us, at any time, without hesitation. In any case, we plan to contact the patient by telephone for a follow-up status report regarding this interventional procedure.  Comments:  No additional relevant information.  Controlled Substance Pharmacotherapy Assessment REMS (Risk Evaluation and Mitigation Strategy)  Analgesic: Oxycodone IR 10 mg 1 tablet p.o. every 6 hours (40 mg/day of oxycodone) MME/day: 60 mg/day.  Janett Billow, RN  05/25/2018 11:23 AM  Sign when Signing Visit Safety precautions to be maintained throughout the outpatient stay will include: orient to surroundings, keep bed in low position, maintain call bell within reach at all times, provide assistance with transfer out of bed and ambulation.    Pharmacokinetics: Liberation and absorption (onset of action): WNL Distribution (time to peak effect): WNL Metabolism and excretion (duration of action): WNL         Pharmacodynamics: Desired effects: Analgesia: [name] reports >50% benefit. Functional ability: Patient reports that medication allows him to accomplish basic ADLs Clinically meaningful improvement in function (CMIF): Sustained CMIF goals met Perceived effectiveness: Described as relatively effective, allowing for increase in activities of daily living  (ADL) Undesirable effects: Side-effects or Adverse reactions: None reported Monitoring: [place] PMP: Online review of the past 76-monthperiod conducted. Compliant with practice rules and regulations Last UDS on record: Summary  Date Value Ref Range Status  04/01/2018 FINAL  Final    Comment:    ==================================================================== TOXASSURE SELECT 13 (MW) ==================================================================== Test                             Result       Flag       Units Drug Present not Declared for Prescription Verification   Morphine                       1878         UNEXPECTED ng/mg creat    Potential sources of large amounts of morphine in the absence of    codeine include administration of morphine or use of heroin. Drug Absent but Declared for Prescription Verification   Oxycodone                      Not Detected UNEXPECTED ng/mg creat ==================================================================== Test                      Result    Flag   Units      Ref Range   Creatinine              109              mg/dL      >=20 ==================================================================== Declared Medications:  The flagging and interpretation on this report are based on the  following declared medications.  Unexpected results may arise from  inaccuracies in the declared medications.  **Note: The testing scope of this panel includes these medications:  Oxycodone  **Note: The testing scope of this panel does not include following  reported medications:  Baclofen  Calcium  Esomeprazole  Gabapentin  Magnesium Oxide  Ondansetron  Polyethylene Glycol (MiraLAX)  Supplement ==================================================================== For clinical consultation, please call [phone]. ====================================================================    UDS interpretation: Compliant          Medication Assessment  Form: Reviewed. Patient indicates being compliant with therapy Treatment compliance: Compliant Risk Assessment Profile: Aberrant behavior: See initial evaluations. None observed or detected today Comorbid factors increasing risk of overdose: See initial evaluation. No additional risks detected today Opioid risk tool (ORT):  Opioid Risk  04/21/2018  Alcohol 0  Illegal Drugs 0  Rx Drugs 0  Alcohol 0  Illegal Drugs 0  Rx Drugs 0  Age between 16-45 years  0  History of Preadolescent Sexual Abuse 0  Psychological Disease 0  Depression 0  Opioid Risk Tool Scoring 0  Opioid Risk Interpretation Low Risk    ORT Scoring interpretation table:  Score <3 = Low Risk for SUD  Score between 4-7 = Moderate Risk for SUD  Score >8 = High Risk for Opioid Abuse   Risk of substance use disorder (SUD): Low  Risk Mitigation Strategies:  Patient Counseling: Covered Patient-Prescriber Agreement (PPA): Present and active  Notification to other healthcare providers: Done  Pharmacologic Plan: No change in therapy, at this time.             Assessment   Status Diagnosis  Controlled Persistent Persistent 1. Chronic pain syndrome   2. Failed back surgical syndrome   3. Chronic low back pain (Primary Area of Pain) (Bilateral) (R>L)   4. Lumbar spondylosis   5. Lumbar facet syndrome (Bilateral) (R>L)   6. Chronic lower extremity radicular pain   7. Presence of intrathecal pump   8. Encounter for adjustment or management of infusion pump   9. Morbid obesity with BMI of 50.0-59.9, adult ([place])   10. Secondary osteoarthritis of multiple sites   11. Osteoarthritis of hip (Right)   12. Chronic nausea s/p bariatric surgery   13. Chronic musculoskeletal pain   14. Muscle spasticity   15. Gastroesophageal reflux disease without esophagitis   16. Neurogenic pain   17. Therapeutic opioid-induced constipation (OIC)      Updated Problems: Problem  Secondary Osteoarthritis of Multiple Sites   Osteoarthritis of hip (Right)  Morbid Obesity With Bmi of 50.0-59.9, Adult ([facility])   Plan of Care  Interventional management options: Planned, scheduled, and/or pending:   Therapeutic/palliative  intrathecal pump refill with change in the primary opioid analgesic from PF-morphine 20 mg/mL to PF-fentanyl 2000 mcg/mL. Diagnostic bilateral lumbar facet block #2under fluoroscopic guidance and IV sedation. We will start the new infusion with the fentanyl as a primary.  This will be initially set at 600 mcg/day and we will adjust as necessary.  The estimated calculation of intrathecal morphine to intrathecal fentanyl is 1:10.  This would suggest that his exact equivalent though should be around 1000 mcg/day of fentanyl.  Because this is a rather hefty dose, I will be conservative and started at the above stated rate.  His bridge bolus will take 12-hour to be completed and then we will give him an additional 24 hours to adjust to the new medication.  We will then give him a call and assess and if needed we will bring her back to adjust the dosing as needed.   Considering:   Diagnostic bilateral lumbar facet block #2 Palliative/therapeuticintrathecal pump refill and  maintenance Diagnostic left Genicular nerve block Possible left Genicular nerve RFA Possible bilateral lumbar facet RFA(Once his BMI is <35) Diagnostic caudal epidural steroid injection+epidurogram Possible Racz procedure Diagnostic bilateral intra-articular shoulder joint injection Diagnostic bilateral suprascapular nerve block Possible bilateral suprascapular nerve RFA Diagnostic interlaminar ESI versus transforaminal ESI   Palliative PRN treatment(s):   Palliative/therapeuticintrathecal pump refill and maintenance Diagnostic bilateral lumbar facet blocks   Imaging Orders  No imaging studies ordered today    Procedure Orders     PUMP REFILL     PUMP REFILL     LUMBAR FACET(MEDIAL BRANCH NERVE BLOCK)  MBNB  Medications ordered for procedure: Meds ordered this encounter  Medications  . Oxycodone HCl 10 MG TABS    Sig: Take 1 tablet (10 mg total) by mouth every 6 (six) hours as needed for up to 30 days. Must last 30 days. Max: 4/day.    Dispense:  120 tablet    Refill:  0    [place] STOP ACT - Not applicable. Fill one day early if pharmacy is closed on scheduled refill date. Do not fill until: 07/30/18. To last until: 08/29/18.  Marland Kitchen Oxycodone HCl 10 MG TABS    Sig: Take 1 tablet (10 mg total) by mouth every 6 (six) hours as needed for up to 30 days. Must last 30 days. Max: 4/day.    Dispense:  120 tablet    Refill:  0    [place] STOP ACT - Not applicable. Fill one day early if pharmacy is closed on scheduled refill date. Do not fill until: 06/30/18.  To last until: 07/30/18.  Marland Kitchen ondansetron (ZOFRAN-ODT) 8 MG disintegrating tablet    Sig: Take 1 tablet (8 mg total) by mouth every 8 (eight) hours as needed for nausea or vomiting.    Dispense:  180 tablet    Refill:  0    Do not place medication on "Automatic Refill". Fill one day early if pharmacy is closed on scheduled refill date.  . baclofen (LIORESAL) 10 MG tablet    Sig: Take 1 tablet (10 mg total) by mouth 4 (four) times daily.    Dispense:  120 tablet    Refill:  1    Do not place medication on "Automatic Refill". Fill one day early if pharmacy is closed on scheduled refill date.  . esomeprazole (NEXIUM) 40 MG capsule    Sig: Take 1 capsule (40 mg total) by mouth 2 (two) times daily.    Dispense:  60 capsule    Refill:  1    Do not place medication on "Automatic Refill". Fill one day early if pharmacy is closed on scheduled refill date.  . Magnesium Oxide 500 MG CAPS    Sig: Take 1 capsule (500 mg total) by mouth 2 (two) times daily at 8 am and 10 pm.    Dispense:  60 capsule    Refill:  1    Do not place medication on "Automatic Refill". Fill one day early if pharmacy is closed on scheduled refill date.  . Gabapentin Enacarbil (HORIZANT)  600 MG TBCR    Sig: Take 1 tablet (600 mg total) by mouth 2 (two) times daily.    Dispense:  120 tablet    Refill:  0    Do not place medication on "Automatic Refill". Fill one day early if pharmacy is closed on scheduled refill date.  . Wheat Dextrin (BENEFIBER) POWD    Sig: Take 6 g by mouth 3 (three) times daily before  meals. (2 tsp = 6 g). Stir into 8 oz of water, or sprinkle over food.    Dispense:  730 g    Refill:  1    OTC product. Prescription is to instruct patient on dosage and frequency.   Medications administered: Jeneen Rinks B. Calvo had no medications administered during this visit.  See the medical record for exact dosing, route, and time of administration.  Disposition: Discharge home  Discharge Date & Time: 05/25/2018; 1208 hrs.   Physician-requested Follow-up: Return for Pump Refill (as per pump program) (Max:39mo.  Future Appointments  Date Time Provider DEl Jebel 06/30/2018  1:15 PM NMilinda Pointer MD [facility] None  08/25/2018 11:45 AM [facility]   Primary Care Physician: HShirline Frees MD [facility] Note by: FGaspar Cola MD Date: 05/25/2018; Time: 12:33 PM  Disclaimer:  Medicine is not an eChief Strategy Officer The only guarantee in medicine is that nothing is guaranteed. It is important to note that the decision to proceed with this intervention was based on the information collected from the patient. The Data and conclusions were drawn from the patient's questionnaire, the interview, and the physical examination. Because the information was provided in large part by the patient, it cannot be guaranteed that it has not been purposely or unconsciously manipulated. Every effort has been made to obtain as much relevant data as possible for this evaluation. It is important to note that the conclusions that lead to this procedure are derived in large part from the available data. Always take  into account that the treatment will also be dependent on availability of resources and existing treatment guidelines, considered by other Pain Management Practitioners as being common knowledge and practice, at the time of the intervention. For Medico-Legal purposes, it is also important to point out that variation in procedural techniques and pharmacological choices are the acceptable norm. The indications, contraindications, technique, and results of the above procedure should only be interpreted and judged by a Board-Certified Interventional Pain Specialist with extensive familiarity and expertise in the same exact procedure and technique.

## 2018-05-25 NOTE — Patient Instructions (Addendum)
____________________________________________________________________________________________  Weight Management Required  URGENT: Your weight has been found to be adversely affecting your health.  Dear Mr. [name]:  Your current There is no height or weight on file to calculate BMI.. Estimated body mass index is 57.39 kg/m as calculated from the following:   Height as of 04/21/18: 5\' 10"  (1.778 m).   Weight as of 04/21/18: 400 lb (181.4 kg).  Your last four (4) weight and BMI calculations are as follows: Wt Readings from Last 4 Encounters:  04/21/18 (!) 400 lb (181.4 kg)  04/01/18 (!) 400 lb (181.4 kg)  01/26/18 (!) 347 lb (157.4 kg)  12/24/17 (!) 350 lb (158.8 kg)   BMI Readings from Last 4 Encounters:  04/21/18 57.39 kg/m  04/01/18 57.39 kg/m  01/26/18 49.79 kg/m  12/24/17 49.51 kg/m    Calculations estimate your ideal body weight to be: Patient weight not recorded  Please use the table below to identify your weight category and associated incidence of chronic pain, secondary to your weight.  BMI interpretation table: BMI level Category Associated incidence of chronic pain  <18 kg/m2 Underweight   18.5-24.9 kg/m2 Ideal body weight   25-29.9 kg/m2 Overweight  20%  30-34.9 kg/m2 Obese (Class I)  68%  35-39.9 kg/m2 Severe obesity (Class II)  136%  >40 kg/m2 Extreme obesity (Class III)  254%   In addition: You will be considered "Morbidly Obese", if your BMI is above 30 and you have one or more of the following conditions which are known to be directly associated with obesity: 1. Type 2 Diabetes (Which in turn can lead to cardiovascular diseases (CVD), stroke, peripheral vascular diseases (PVD), retinopathy, nephropathy, and neuropathy) 2. Cardiovascular Disease (High Blood Pressure; Congestive Heart Failure; High Cholesterol; Coronary Artery Disease; Angina; or History of Heart Attacks) 3. Breathing problems (Asthma; obesity-hypoventilation syndrome; obstructive sleep  apnea; chronic inflammatory airway disease; reactive airway disease; or shortness of breath) 4. Chronic kidney disease 5. Liver disease (nonalcoholic fatty liver disease) 6. High blood pressure 7. Acid reflux (gastroesophageal reflux disease; heartburn) 8. Osteoarthritis (OA) (with any of the following: hip pain; knee pain; and/or low back pain) 9. Low back pain (Lumbar Facet Syndrome; and/or Degenerative Disc Disease) 10. Hip pain (Osteoarthritis of hip) (For every 1 lbs of added body weight, there is a 2 lbs increase in pressure inside of each hip articulation. 1:2 mechanical relationship) 11. Knee pain (Osteoarthritis of knee) (For every 1 lbs of added body weight, there is a 4 lbs increase in pressure inside of each knee articulation. 1:4 mechanical relationship) (patients with a BMI>30 kg/m2 were 6.8 times more likely to develop knee OA than normal-weight individuals) 12. Certain types of cancer. (Epidemiological studies have shown that obesity is a risk factor for: post-menopausal breast cancer; cancers of the endometrium, colon and kidney cancer; malignant adenomas of the oesophagus. Obese subjects have an approximately 1.5-3.5-fold increased risk of developing these cancers compared with normal-weight subjects, and it has been estimated that between 15 and 45% of these cancers can be attributed to overweight. More recent studies suggest that obesity may also increase the risk of other types of cancer, including pancreatic, hepatic and gallbladder cancer. Ref: Obesity and cancer. Pischon T, Nthlings U, [employer] H. Proc Nutr Soc. 2008 May;67(2):128-45. doi: 10.1017/S0029665108006976.)  Recommendation: At this point it is urgent that you take a step back and concentrate in loosing weight. Dedicate 100% of your efforts on this task. Nothing else will improve your health more than bringing down your BMI to less  than 30. Because most chronic pain patients do have difficulty exercising secondary to their  pain, you must rely on proper nutrition and dieting in order to lose the weight. If your BMI is above 40, you should seriously consider bariatric surgery. A realistic goal is to lose 10% of your body weight over a period of 12 months.  If over time you have unsuccessfully try to lose weight, then it is time for you to seek professional help and to enter a medically supervised weight management program.  Pain management considerations:  1. Pharmacological Problems: Be advised that the use of opioid analgesics (oxycodone; hydrocodone; morphine; methadone; codeine; and all of their derivatives) have been associated with decreased metabolism and weight gain.  For this reason, should we see that you are unable to lose weight while taking these medications, it may become necessary for us to taper down and indefinitely discontinue them.  2. Technical Problems: The incidence of successful interventional therapies decreases as the patient's BMI increases. It is much more difficult to accomplish a safe and effective interventional therapy on a patient with a BMI above 35. Yours is There is no height or weight on file to calculate BMI..  3. Radiation Exposure Problems: The x-rays machine, used to accomplish injection therapies, will automatically increase their x-ray output in order to capture an appropriate bone image. This means that radiation exposure increases exponentially with the patient's BMI. (The higher the BMI, the higher the radiation exposure.) Although the level of radiation used at a given time is still safe to the patient, it is not for the physician and/or assisting staff. Unfortunately, radiation exposure is accumulative. Because physicians and the staff have to do procedures and be exposed on a daily basis, this can result in health problems such as cancer and radiation burns. Radiation exposure to the staff is monitored by the radiation batches that they wear. The exposure levels are reported back to  the staff on a quarterly basis. Depending on levels of exposure, physicians and staff may be obligated by law to decrease this exposure. This means that they have the right and obligation to refuse providing therapies where they may be overexposed to radiation. For this reason, physicians may decline to offer therapies such as radiofrequency ablation or implants to patients with a BMI above 40. 4. Current Trends: Be advised that the current trend is to no longer offer certain therapies to patients with a BMI equal to, or above 35, due to increase perioperative risks, increased technical procedural difficulties, and excessive radiation exposure to healthcare personnel. ____________________________________________________________________________________________  ____________________________________________________________________________________________  Preparing for Procedure with Sedation  Instructions: . Oral Intake: Do not eat or drink anything for at least 8 hours prior to your procedure. . Transportation: Public transportation is not allowed. Bring an adult driver. The driver must be physically present in our waiting room before any procedure can be started. Marland Kitchen. Physical Assistance: Bring an adult physically capable of assisting you, in the event you need help. This adult should keep you company at home for at least 6 hours after the procedure. . Blood Pressure Medicine: Take your blood pressure medicine with a sip of water the morning of the procedure. . Blood thinners: Notify our staff if you are taking any blood thinners. Depending on which one you take, there will be specific instructions on how and when to stop it. . Diabetics on insulin: Notify the staff so that you can be scheduled 1st case in the morning. If your diabetes requires  high dose insulin, take only  of your normal insulin dose the morning of the procedure and notify the staff that you have done so. . Preventing infections:  Shower with an antibacterial soap the morning of your procedure. . Build-up your immune system: Take 1000 mg of Vitamin C with every meal (3 times a day) the day prior to your procedure. Marland Kitchen Antibiotics: Inform the staff if you have a condition or reason that requires you to take antibiotics before dental procedures. . Pregnancy: If you are pregnant, call and cancel the procedure. . Sickness: If you have a cold, fever, or any active infections, call and cancel the procedure. . Arrival: You must be in the facility at least 30 minutes prior to your scheduled procedure. . Children: Do not bring children with you. . Dress appropriately: Bring dark clothing that you would not mind if they get stained. . Valuables: Do not bring any jewelry or valuables.  Procedure appointments are reserved for interventional treatments only. Marland Kitchen No Prescription Refills. . No medication changes will be discussed during procedure appointments. . No disability issues will be discussed.  Reasons to call and reschedule or cancel your procedure: (Following these recommendations will minimize the risk of a serious complication.) . Surgeries: Avoid having procedures within 2 weeks of any surgery. (Avoid for 2 weeks before or after any surgery). . Flu Shots: Avoid having procedures within 2 weeks of a flu shots or . (Avoid for 2 weeks before or after immunizations). . Barium: Avoid having a procedure within 7-10 days after having had a radiological study involving the use of radiological contrast. (Myelograms, Barium swallow or enema study). . Heart attacks: Avoid any elective procedures or surgeries for the initial 6 months after a "Myocardial Infarction" (Heart Attack). . Blood thinners: It is imperative that you stop these medications before procedures. Let us know if you if you take any blood thinner.  . Infection: Avoid procedures during or within two weeks of an infection (including chest colds or gastrointestinal problems).  Symptoms associated with infections include: Localized redness, fever, chills, night sweats or profuse sweating, burning sensation when voiding, cough, congestion, stuffiness, runny nose, sore throat, diarrhea, nausea, vomiting, cold or Flu symptoms, recent or current infections. It is specially important if the infection is over the area that we intend to treat. Marland Kitchen Heart and lung problems: Symptoms that may suggest an active cardiopulmonary problem include: cough, chest pain, breathing difficulties or shortness of breath, dizziness, ankle swelling, uncontrolled high or unusually low blood pressure, and/or palpitations. If you are experiencing any of these symptoms, cancel your procedure and contact your primary care physician for an evaluation.  Remember:  Regular Business hours are:  Monday to Thursday 8:00 AM to 4:00 PM  Provider's Schedule: [name], MD:  Procedure days: Tuesday and Thursday 7:30 AM to 4:00 PM  Edward Jolly, MD:  Procedure days: Monday and Wednesday 7:30 AM to 4:00 PM ____________________________________________________________________________________________   Preparing for Procedure with Sedation Instructions: . Oral Intake: Do not eat or drink anything for at least 8 hours prior to your procedure. . Transportation: Public transportation is not allowed. Bring an adult driver. The driver must be physically present in our waiting room before any procedure can be started. Marland Kitchen Physical Assistance: Bring an adult capable of physically assisting you, in the event you need help. . Blood Pressure Medicine: Take your blood pressure medicine with a sip of water the morning of the procedure. . Insulin: Take only  of your normal insulin  dose. . Preventing infections: Shower with an antibacterial soap the morning of your procedure. . Build-up your immune system: Take 1000 mg of Vitamin C with every meal (3 times a day) the day prior to your procedure. . Pregnancy: If you are  pregnant, call and cancel the procedure. . Sickness: If you have a cold, fever, or any active infections, call and cancel the procedure. . Arrival: You must be in the facility at least 30 minutes prior to your scheduled procedure. . Children: Do not bring children with you. . Dress appropriately: Bring dark clothing that you would not mind if they get stained. . Valuables: Do not bring any jewelry or valuables. Procedure appointments are reserved for interventional treatments only. Marland Kitchen No Prescription Refills. . No medication changes will be discussed during procedure appointments. No disability issues will be discussed.Facet Blocks Patient Information  Description: The facets are joints in the spine between the vertebrae.  Like any joints in the body, facets can become irritated and painful.  Arthritis can also effect the facets.  By injecting steroids and local anesthetic in and around these joints, we can temporarily block the nerve supply to them.  Steroids act directly on irritated nerves and tissues to reduce selling and inflammation which often leads to decreased pain.  Facet blocks may be done anywhere along the spine from the neck to the low back depending upon the location of your pain.   After numbing the skin with local anesthetic (like Novocaine), a small needle is passed onto the facet joints under x-ray guidance.  You may experience a sensation of pressure while this is being done.  The entire block usually lasts about 15-25 minutes.   Conditions which may be treated by facet blocks:   Low back/buttock pain  Neck/shoulder pain  Certain types of headaches  Preparation for the injection:  1. Do not eat any solid food or dairy products within 8 hours of your appointment. 2. You may drink clear liquid up to 3 hours before appointment.  Clear liquids include water, black coffee, juice or soda.  No milk or cream please. 3. You may take your regular medication, including pain  medications, with a sip of water before your appointment.  Diabetics should hold regular insulin (if taken separately) and take 1/2 normal NPH dose the morning of the procedure.  Carry some sugar containing items with you to your appointment. 4. A driver must accompany you and be prepared to drive you home after your procedure. 5. Bring all your current medications with you. 6. An IV may be inserted and sedation may be given at the discretion of the physician. 7. A blood pressure cuff, EKG and other monitors will often be applied during the procedure.  Some patients may need to have extra oxygen administered for a short period. 8. You will be asked to provide medical information, including your allergies and medications, prior to the procedure.  We must know immediately if you are taking blood thinners (like Coumadin/Warfarin) or if you are allergic to IV iodine contrast (dye).  We must know if you could possible be pregnant.  Possible side-effects:   Bleeding from needle site  Infection (rare, may require surgery)  Nerve injury (rare)  Numbness & tingling (temporary)  Difficulty urinating (rare, temporary)  Spinal headache (a headache worse with upright posture)  Light-headedness (temporary)  Pain at injection site (serveral days)  Decreased blood pressure (rare, temporary)  Weakness in arm/leg (temporary)  Pressure sensation in back/neck (temporary)  Call if you experience:   Fever/chills associated with headache or increased back/neck pain  Headache worsened by an upright position  New onset, weakness or numbness of an extremity below the injection site  Hives or difficulty breathing (go to the emergency room)  Inflammation or drainage at the injection site(s)  Severe back/neck pain greater than usual  New symptoms which are concerning to you  Please note:  Although the local anesthetic injected can often make your back or neck feel good for several hours after  the injection, the pain will likely return. It takes 3-7 days for steroids to work.  You may not notice any pain relief for at least one week.  If effective, we will often do a series of 2-3 injections spaced 3-6 weeks apart to maximally decrease your pain.  After the initial series, you may be a candidate for a more permanent nerve block of the facets.  If you have any questions, please call #[phone] [facility] Opioid Overdose Opioids are substances that relieve pain by binding to pain receptors in your brain and spinal cord. Opioids include illegal drugs, such as heroin, as well as prescription pain medicines.An opioid overdose happens when you take too much of an opioid substance. This can happen with any type of opioid, including: Heroin. Morphine. Codeine. Methadone. Oxycodone. Hydrocodone. Fentanyl. Hydromorphone. Buprenorphine. The effects of an overdose can be mild, dangerous, or even deadly. Opioid overdose is a medical emergency. What are the causes? This condition may be caused by: Taking too much of an opioid by accident. Taking too much of an opioid on purpose. An error made by a health care provider who prescribes a medicine. An error made by the pharmacist who fills the prescription order. Using more than one substance that contains opioids at the same time. Mixing an opioid with a substance that affects your heart, breathing, or blood pressure. These include alcohol, tranquilizers, sleeping pills, illegal drugs, and some over-the-counter medicines. What increases the risk? This condition is more likely in: Children. They may be attracted to colorful pills. Because of a child's small size, even a small amount of a drug can be dangerous. Elderly people. They may be taking many different drugs. Elderly people may have difficulty reading labels or remembering when they last took their medicine. People who take an opioid on a long-term  basis. People who use: Illegal drugs. Other substances, including alcohol, while using an opioid. People who have: A history of drug or alcohol abuse. Certain mental health conditions. People who take opioids that are not prescribed for them. What are the signs or symptoms? Symptoms of this condition depend on the type of opioid and the amount that was taken. Common symptoms include: Sleepiness or difficulty waking from sleep. Confusion. Slurred speech. Slowed breathing and a slow pulse. Nausea and vomiting. Abnormally small pupils. Signs and symptoms that require emergency treatment include: Cold, clammy, and pale skin. Blue lips and fingernails. Vomiting. Gurgling sounds in the throat. A pulse that is very slow or difficult to detect. Breathing that is very slow, noisy, or difficult to detect. Limp body. Inability to respond to speech or be awakened from sleep (stupor). How is this diagnosed? This condition is diagnosed based on your symptoms. It is important to tell your health care provider: All of the opioidsthat you took. When you took the opioids. Whether you were drinking alcohol or using other substances. Your health care provider will do a physical exam. This exam  may include: Checking and monitoring your heart rate and rhythm, your breathing rate and depth, your temperature, and your blood pressure (vital signs). Checking for abnormally small pupils. Measuring oxygen levels in your blood. You may also have blood tests or urine tests. How is this treated? Supporting your vital signs and your breathing is the first step in treating an opioid overdose. Treatment may also include: Giving fluids and minerals (electrolytes) through an IV tube. Inserting a breathing tube (endotracheal tube) in your airway to help you breathe. Giving oxygen. Passing a tube through your nose and into your stomach (NG tube, or nasogastric tube) to wash out your stomach. Giving medicines  that: Increase your blood pressure. Absorb any opioid that is in your digestive system. Reverse the effects of the opioid (naloxone). Ongoing counseling and mental health support if you intentionally overdosed or used an illegal drug. Follow these instructions at home:  Take over-the-counter and prescription medicines only as told by your health care provider. Always ask your health care provider about possible side effects and interactions of any new medicine that you start taking. Keep a list of all of the medicines that you take, including over-the-counter medicines. Bring this list with you to all of your medical visits. Drink enough fluid to keep your urine clear or pale yellow. Keep all follow-up visits as told by your health care provider. This is important. How is this prevented? Get help if you are struggling with: Alcohol or drug use. Depression or another mental health problem. Keep the phone number of your local poison control center near your phone or on your cell phone. Store all medicines in safety containers that are out of the reach of children. Read the drug inserts that come with your medicines. Do not drink alcohol when taking opioids. Do not use illegal drugs. Do not take opioid medicines that are not prescribed for you. Contact a health care provider if: Your symptoms return. You develop new symptoms or side effects when you are taking medicines. Get help right away if: You think that you or someone else may have taken too much of an opioid. The hotline of the [facility] is 906 099 2966. You or someone else is having symptoms of an opioid overdose. You have serious thoughts about hurting yourself or others. You have: Chest pain. Difficulty breathing. A loss of consciousness. Opioid overdose is an emergency. Do not wait to see if the symptoms will go away. Get medical help right away. Call your local emergency services (911 in the [place] Do  not drive yourself to the hospital. This information is not intended to replace advice given to you by your health care provider. Make sure you discuss any questions you have with your health care provider. Document Released: 05/08/2004 Document Revised: 10/16/2016 Document Reviewed: 09/14/2014 Elsevier Interactive Patient Education  [employer]. .

## 2018-05-25 NOTE — Progress Notes (Signed)
Safety precautions to be maintained throughout the outpatient stay will include: orient to surroundings, keep bed in low position, maintain call bell within reach at all times, provide assistance with transfer out of bed and ambulation.  

## 2018-05-26 ENCOUNTER — Telehealth: Payer: Self-pay | Admitting: *Deleted

## 2018-05-26 NOTE — Telephone Encounter (Signed)
No problems post pump refill. 

## 2018-05-27 ENCOUNTER — Ambulatory Visit: Payer: No Typology Code available for payment source | Attending: Pain Medicine | Admitting: Pain Medicine

## 2018-05-27 ENCOUNTER — Telehealth: Payer: Self-pay | Admitting: Pain Medicine

## 2018-05-27 ENCOUNTER — Encounter: Payer: Self-pay | Admitting: Pain Medicine

## 2018-05-27 ENCOUNTER — Other Ambulatory Visit: Payer: Self-pay

## 2018-05-27 VITALS — BP 136/86 | HR 91 | Temp 97.4°F | Resp 18 | Ht 70.0 in | Wt >= 6400 oz

## 2018-05-27 DIAGNOSIS — M47816 Spondylosis without myelopathy or radiculopathy, lumbar region: Secondary | ICD-10-CM

## 2018-05-27 DIAGNOSIS — F1123 Opioid dependence with withdrawal: Secondary | ICD-10-CM | POA: Insufficient documentation

## 2018-05-27 DIAGNOSIS — Z451 Encounter for adjustment and management of infusion pump: Secondary | ICD-10-CM | POA: Insufficient documentation

## 2018-05-27 DIAGNOSIS — M961 Postlaminectomy syndrome, not elsewhere classified: Secondary | ICD-10-CM | POA: Diagnosis present

## 2018-05-27 DIAGNOSIS — F1193 Opioid use, unspecified with withdrawal: Secondary | ICD-10-CM | POA: Insufficient documentation

## 2018-05-27 DIAGNOSIS — Z978 Presence of other specified devices: Secondary | ICD-10-CM | POA: Diagnosis present

## 2018-05-27 DIAGNOSIS — G894 Chronic pain syndrome: Secondary | ICD-10-CM | POA: Diagnosis present

## 2018-05-27 MED ORDER — CLONIDINE 0.1 MG/24HR TD PTWK: 0.1000 mg | MEDICATED_PATCH | TRANSDERMAL | 0 refills | Status: DC

## 2018-05-27 NOTE — Telephone Encounter (Signed)
Pt left a voicemail stating that he is having withdrawals from morphine since his last pump refill was fentanyl. Pt would like a nurse to call him back.

## 2018-05-27 NOTE — Progress Notes (Addendum)
Safety precautions to be maintained throughout the outpatient stay will include: orient to surroundings, keep bed in low position, maintain call bell within reach at all times, provide assistance with transfer out of bed and ambulation. IT pump increase 33.2%

## 2018-05-27 NOTE — Patient Instructions (Signed)
Opioid Overdose Opioids are substances that relieve pain by binding to pain receptors in your brain and spinal cord. Opioids include illegal drugs, such as heroin, as well as prescription pain medicines.An opioid overdose happens when you take too much of an opioid substance. This can happen with any type of opioid, including:  Heroin.  Morphine.  Codeine.  Methadone.  Oxycodone.  Hydrocodone.  Fentanyl.  Hydromorphone.  Buprenorphine. The effects of an overdose can be mild, dangerous, or even deadly. Opioid overdose is a medical emergency. What are the causes? This condition may be caused by:  Taking too much of an opioid by accident.  Taking too much of an opioid on purpose.  An error made by a health care provider who prescribes a medicine.  An error made by the pharmacist who fills the prescription order.  Using more than one substance that contains opioids at the same time.  Mixing an opioid with a substance that affects your heart, breathing, or blood pressure. These include alcohol, tranquilizers, sleeping pills, illegal drugs, and some over-the-counter medicines. What increases the risk? This condition is more likely in:  Children. They may be attracted to colorful pills. Because of a child's small size, even a small amount of a drug can be dangerous.  Elderly people. They may be taking many different drugs. Elderly people may have difficulty reading labels or remembering when they last took their medicine.  People who take an opioid on a long-term basis.  People who use: ? Illegal drugs. ? Other substances, including alcohol, while using an opioid.  People who have: ? A history of drug or alcohol abuse. ? Certain mental health conditions.  People who take opioids that are not prescribed for them. What are the signs or symptoms? Symptoms of this condition depend on the type of opioid and the amount that was taken. Common symptoms include:  Sleepiness  or difficulty waking from sleep.  Confusion.  Slurred speech.  Slowed breathing and a slow pulse.  Nausea and vomiting.  Abnormally small pupils. Signs and symptoms that require emergency treatment include:  Cold, clammy, and pale skin.  Blue lips and fingernails.  Vomiting.  Gurgling sounds in the throat.  A pulse that is very slow or difficult to detect.  Breathing that is very slow, noisy, or difficult to detect.  Limp body.  Inability to respond to speech or be awakened from sleep (stupor). How is this diagnosed? This condition is diagnosed based on your symptoms. It is important to tell your health care provider:  All of the opioidsthat you took.  When you took the opioids.  Whether you were drinking alcohol or using other substances. Your health care provider will do a physical exam. This exam may include:  Checking and monitoring your heart rate and rhythm, your breathing rate and depth, your temperature, and your blood pressure (vital signs).  Checking for abnormally small pupils.  Measuring oxygen levels in your blood. You may also have blood tests or urine tests. How is this treated? Supporting your vital signs and your breathing is the first step in treating an opioid overdose. Treatment may also include:  Giving fluids and minerals (electrolytes) through an IV tube.  Inserting a breathing tube (endotracheal tube) in your airway to help you breathe.  Giving oxygen.  Passing a tube through your nose and into your stomach (NG tube, or nasogastric tube) to wash out your stomach.  Giving medicines that: ? Increase your blood pressure. ? Absorb any opioid that   is in your digestive system. ? Reverse the effects of the opioid (naloxone).  Ongoing counseling and mental health support if you intentionally overdosed or used an illegal drug. Follow these instructions at home:   Take over-the-counter and prescription medicines only as told by your  health care provider. Always ask your health care provider about possible side effects and interactions of any new medicine that you start taking.  Keep a list of all of the medicines that you take, including over-the-counter medicines. Bring this list with you to all of your medical visits.  Drink enough fluid to keep your urine clear or pale yellow.  Keep all follow-up visits as told by your health care provider. This is important. How is this prevented?  Get help if you are struggling with: ? Alcohol or drug use. ? Depression or another mental health problem.  Keep the phone number of your local poison control center near your phone or on your cell phone.  Store all medicines in safety containers that are out of the reach of children.  Read the drug inserts that come with your medicines.  Do not drink alcohol when taking opioids.  Do not use illegal drugs.  Do not take opioid medicines that are not prescribed for you. Contact a health care provider if:  Your symptoms return.  You develop new symptoms or side effects when you are taking medicines. Get help right away if:  You think that you or someone else may have taken too much of an opioid. The hotline of the National Poison Control Center is (800) 222-1222.  You or someone else is having symptoms of an opioid overdose.  You have serious thoughts about hurting yourself or others.  You have: ? Chest pain. ? Difficulty breathing. ? A loss of consciousness. Opioid overdose is an emergency. Do not wait to see if the symptoms will go away. Get medical help right away. Call your local emergency services (911 in the [place] Do not drive yourself to the hospital. This information is not intended to replace advice given to you by your health care provider. Make sure you discuss any questions you have with your health care provider. Document Released: 05/08/2004 Document Revised: 10/16/2016 Document Reviewed: 09/14/2014 Elsevier  Interactive Patient Education  2019 [employer]  

## 2018-05-27 NOTE — Progress Notes (Signed)
Patient's Name: [name]  MRN: [id number]  Referring Provider: Johny Blamer, MD  DOB: [birth year]  PCP: Johny Blamer, MD  DOS: 05/27/2018  Note by: Oswaldo Done, MD  Service setting: Ambulatory outpatient  Specialty: Interventional Pain Management  Patient type: Established  Location: [facility] (AMB) Pain Management Facility  Visit type: Interventional Procedure   Primary Reason for Visit: Interventional Pain Management Treatment. CC: Leg Pain  Procedure:          Intrathecal Drug Delivery System (IDDS):  Type: Analysis & Programming (97026) Rate increase to 800 mcg/day of fentanyl. Region: Abdominal Laterality: Left  Type of Pump: Medtronic Synchromed II Delivery Route: Intrathecal Type of Pain Treated: Neuropathic/Nociceptive Primary Medication Class: Opioid/opiate  Medication, Concentration, Infusion Program, & Delivery Rate: Please see scanned programming printout.   Indications: 1. Chronic pain syndrome   2. Failed back surgical syndrome   3. Lumbar spondylosis   4. Presence of intrathecal pump   5. Encounter for adjustment or management of infusion pump   6. Withdrawal from opioids (HCC)    Pain Assessment: Self-Reported Pain Score: 6 /10             Reported level is compatible with observation.        Intrathecal Pump Therapy Assessment  Manufacturer: Medtronic Synchromed Type: Programmable Volume: 40 mL reservoir MRI compatibility: Yes   Drug content:  Primary Medication Class: Opioid Primary Medication: PF-Fentanyl (2,000.0 mcg/ml)  Secondary Medication: PF-Bupivacaine (20 mg/mL)  Other Medication: see pump readout   Programming:  Type: Simple continuous. See pump readout for details.   Changes:  Medication Change: None at this point Rate Change: The infusion rate was increased to where the patient is receiving 800 mcg/day of the fentanyl.  Reported side-effects or adverse reactions: The medication and the intrathecal pump was changed from  morphine to fentanyl approximately 2 days ago.  Yesterday the patient started experiencing withdrawals and increased pain.  Effectiveness: Described as ineffective and would like to make some changes Clinically meaningful improvement in function (CMIF): Medication does not meet basic CMIF  Plan: Analysis and programming  The patient's wife has volunteered that his oral opioid bottle apparently fell in the water and this damaged his pills.  He has not been taking any of his oral medications for a while and this is also contributing to his withdrawals.  Pre-op Assessment:  [name] is a 53 y.o. (year old), male patient, seen today for interventional treatment. He  has a past surgical history that includes Back surgery; morphine pump; Tonsillectomy; Joint replacement; Shoulder arthroscopy with subacromial decompression, rotator cuff repair and bicep tendon repair (Right, 08/18/2013); Shoulder arthroscopy with subacromial decompression and bicep tendon repair (Left, 10/06/2013); and Laparoscopic gastric sleeve resection (N/A, 01/08/2015). [name] has a current medication list which includes the following prescription(s): baclofen, esomeprazole, gabapentin enacarbil, gnp milk of magnesia, magnesium oxide, ondansetron, oxycodone hcl, oxycodone hcl, oxycodone hcl, polycarbophil, PRESCRIPTION MEDICATION, promethazine, benefiber, and clonidine. His primarily concern today is the Leg Pain  Initial Vital Signs:  Pulse/HCG Rate: 91  Temp: (!) 97.4 F (36.3 C) Resp: 18 BP: 136/86 SpO2: 90 %  BMI: Estimated body mass index is 61.7 kg/m as calculated from the following:   Height as of this encounter: 5\' 10"  (1.778 m).   Weight as of this encounter: 430 lb (195 kg).  Risk Assessment: Allergies: Reviewed. He is allergic to tape.  Allergy Precautions: None required Coagulopathies: Reviewed. None identified.  Blood-thinner therapy: None at this time Active Infection(s):  Reviewed. None identified. Mr.  Synetta [name] is afebrile  Site Confirmation: [name] was asked to confirm the procedure and laterality before marking the site Procedure checklist: Completed Consent: Before the procedure and under the influence of no sedative(s), amnesic(s), or anxiolytics, the patient was informed of the treatment options, risks and possible complications. To fulfill our ethical and legal obligations, as recommended by the American Medical Association's Code of Ethics, I have informed the patient of my clinical impression; the nature and purpose of the treatment or procedure; the risks, benefits, and possible complications of the intervention; the alternatives, including doing nothing; the risk(s) and benefit(s) of the alternative treatment(s) or procedure(s); and the risk(s) and benefit(s) of doing nothing.  [name] was provided with information about the general risks and possible complications associated with most interventional procedures. These include, but are not limited to: failure to achieve desired goals, infection, bleeding, organ or nerve damage, allergic reactions, paralysis, and/or death.  In addition, he was informed of those risks and possible complications associated to this particular procedure, which include, but are not limited to: damage to the implant; failure to decrease pain; local, systemic, or serious CNS infections, intraspinal abscess with possible cord compression and paralysis, or life-threatening such as meningitis; bleeding; organ damage; nerve injury or damage with subsequent sensory, motor, and/or autonomic system dysfunction, resulting in transient or permanent pain, numbness, and/or weakness of one or several areas of the body; allergic reactions, either minor or major life-threatening, such as anaphylactic or anaphylactoid reactions.  Furthermore, [name] was informed of those risks and complications associated with the medications. These include, but are not limited to:  allergic reactions (i.e.: anaphylactic or anaphylactoid reactions); endorphine suppression; bradycardia and/or hypotension; water retention and/or peripheral vascular relaxation leading to lower extremity edema and possible stasis ulcers; respiratory depression and/or shortness of breath; decreased metabolic rate leading to weight gain; swelling or edema; medication-induced neural toxicity; particulate matter embolism and blood vessel occlusion with resultant organ, and/or nervous system infarction; and/or intrathecal granuloma formation with possible spinal cord compression and permanent paralysis.  Before refilling the pump [name] was informed that some of the medications used in the devise may not be FDA approved for such use and therefore it constitutes an off-label use of the medications.  Finally, he was informed that Medicine is not an exact science; therefore, there is also the possibility of unforeseen or unpredictable risks and/or possible complications that may result in a catastrophic outcome. The patient indicated having understood very clearly. We have given the patient no guarantees and we have made no promises. Enough time was given to the patient to ask questions, all of which were answered to the patient's satisfaction. [name] has indicated that he wanted to continue with the procedure. Attestation: I, the ordering provider, attest that I have discussed with the patient the benefits, risks, side-effects, alternatives, likelihood of achieving goals, and potential problems during recovery for the procedure that I have provided informed consent. Date  Time: 05/27/2018 12:01 PM  Vitals:   05/27/18 1200  BP: 136/86  Pulse: 91  Resp: 18  Temp: (!) 97.4 F (36.3 C)  SpO2: 90%  Weight: (!) 430 lb (195 kg)  Height: 5\' 10"  (1.778 m)   Plan of Care   Imaging Orders  No imaging studies ordered today    Procedure Orders     PUMP REPROGRAM  Medications ordered for  procedure: Meds ordered this encounter  Medications  . cloNIDine (CATAPRES - DOSED IN MG/24  HR) 0.1 mg/24hr patch    Sig: Place 1 patch (0.1 mg total) onto the skin once a week for 14 days. Discontinue if blood pressure is too low.    Dispense:  2 patch    Refill:  0   Medications administered: Fayrene FearingJames B. Koffman had no medications administered during this visit.  See the medical record for exact dosing, route, and time of administration.  Disposition: Discharge home  Discharge Date & Time: 05/27/2018; 1227 hrs.   Physician-requested Follow-up: Return for Pump Refill (as per pump program) (Max:4766mo).  Future Appointments  Date Time Provider Department Center  06/30/2018  1:15 PM Delano MetzNaveira, Javelle Donigan, MD [facility] None  08/25/2018 11:45 AM Del[facility]aveira[facility] Niese[facility]   Primary Care Physician: Johny BlamerHarris, William, MD [facility] Note by: Oswaldo DoneFrancisco A Janellie Tennison, MD Date: 05/27/2018; Time: 12:39 PM  Disclaimer:  Medicine is not an Visual merchandiserexact science. The only guarantee in medicine is that nothing is guaranteed. It is important to note that the decision to proceed with this intervention was based on the information collected from the patient. The Data and conclusions were drawn from the patient's questionnaire, the interview, and the physical examination. Because the information was provided in large part by the patient, it cannot be guaranteed that it has not been purposely or unconsciously manipulated. Every effort has been made to obtain as much relevant data as possible for this evaluation. It is important to note that the conclusions that lead to this procedure are derived in large part from the available data. Always take into account that the treatment will also be dependent on availability of resources and existing treatment guidelines, considered by other Pain Management Practitioners as being common knowledge and practice, at the time of the intervention.  For Medico-Legal purposes, it is also important to point out that variation in procedural techniques and pharmacological choices are the acceptable norm. The indications, contraindications, technique, and results of the above procedure should only be interpreted and judged by a Board-Certified Interventional Pain Specialist with extensive familiarity and expertise in the same exact procedure and technique.

## 2018-05-28 ENCOUNTER — Ambulatory Visit: Payer: No Typology Code available for payment source | Attending: Pain Medicine | Admitting: Pain Medicine

## 2018-05-28 ENCOUNTER — Other Ambulatory Visit: Payer: Self-pay

## 2018-05-28 ENCOUNTER — Other Ambulatory Visit: Payer: Self-pay | Admitting: Pain Medicine

## 2018-05-28 DIAGNOSIS — M961 Postlaminectomy syndrome, not elsewhere classified: Secondary | ICD-10-CM

## 2018-05-28 DIAGNOSIS — Z451 Encounter for adjustment and management of infusion pump: Secondary | ICD-10-CM

## 2018-05-28 DIAGNOSIS — Z978 Presence of other specified devices: Secondary | ICD-10-CM

## 2018-05-28 DIAGNOSIS — G894 Chronic pain syndrome: Secondary | ICD-10-CM

## 2018-05-28 NOTE — Patient Instructions (Signed)
Opioid Overdose Opioids are substances that relieve pain by binding to pain receptors in your brain and spinal cord. Opioids include illegal drugs, such as heroin, as well as prescription pain medicines.An opioid overdose happens when you take too much of an opioid substance. This can happen with any type of opioid, including:  Heroin.  Morphine.  Codeine.  Methadone.  Oxycodone.  Hydrocodone.  Fentanyl.  Hydromorphone.  Buprenorphine. The effects of an overdose can be mild, dangerous, or even deadly. Opioid overdose is a medical emergency. What are the causes? This condition may be caused by:  Taking too much of an opioid by accident.  Taking too much of an opioid on purpose.  An error made by a health care provider who prescribes a medicine.  An error made by the pharmacist who fills the prescription order.  Using more than one substance that contains opioids at the same time.  Mixing an opioid with a substance that affects your heart, breathing, or blood pressure. These include alcohol, tranquilizers, sleeping pills, illegal drugs, and some over-the-counter medicines. What increases the risk? This condition is more likely in:  Children. They may be attracted to colorful pills. Because of a child's small size, even a small amount of a drug can be dangerous.  Elderly people. They may be taking many different drugs. Elderly people may have difficulty reading labels or remembering when they last took their medicine.  People who take an opioid on a long-term basis.  People who use: ? Illegal drugs. ? Other substances, including alcohol, while using an opioid.  People who have: ? A history of drug or alcohol abuse. ? Certain mental health conditions.  People who take opioids that are not prescribed for them. What are the signs or symptoms? Symptoms of this condition depend on the type of opioid and the amount that was taken. Common symptoms include:  Sleepiness  or difficulty waking from sleep.  Confusion.  Slurred speech.  Slowed breathing and a slow pulse.  Nausea and vomiting.  Abnormally small pupils. Signs and symptoms that require emergency treatment include:  Cold, clammy, and pale skin.  Blue lips and fingernails.  Vomiting.  Gurgling sounds in the throat.  A pulse that is very slow or difficult to detect.  Breathing that is very slow, noisy, or difficult to detect.  Limp body.  Inability to respond to speech or be awakened from sleep (stupor). How is this diagnosed? This condition is diagnosed based on your symptoms. It is important to tell your health care provider:  All of the opioidsthat you took.  When you took the opioids.  Whether you were drinking alcohol or using other substances. Your health care provider will do a physical exam. This exam may include:  Checking and monitoring your heart rate and rhythm, your breathing rate and depth, your temperature, and your blood pressure (vital signs).  Checking for abnormally small pupils.  Measuring oxygen levels in your blood. You may also have blood tests or urine tests. How is this treated? Supporting your vital signs and your breathing is the first step in treating an opioid overdose. Treatment may also include:  Giving fluids and minerals (electrolytes) through an IV tube.  Inserting a breathing tube (endotracheal tube) in your airway to help you breathe.  Giving oxygen.  Passing a tube through your nose and into your stomach (NG tube, or nasogastric tube) to wash out your stomach.  Giving medicines that: ? Increase your blood pressure. ? Absorb any opioid that   is in your digestive system. ? Reverse the effects of the opioid (naloxone).  Ongoing counseling and mental health support if you intentionally overdosed or used an illegal drug. Follow these instructions at home:   Take over-the-counter and prescription medicines only as told by your  health care provider. Always ask your health care provider about possible side effects and interactions of any new medicine that you start taking.  Keep a list of all of the medicines that you take, including over-the-counter medicines. Bring this list with you to all of your medical visits.  Drink enough fluid to keep your urine clear or pale yellow.  Keep all follow-up visits as told by your health care provider. This is important. How is this prevented?  Get help if you are struggling with: ? Alcohol or drug use. ? Depression or another mental health problem.  Keep the phone number of your local poison control center near your phone or on your cell phone.  Store all medicines in safety containers that are out of the reach of children.  Read the drug inserts that come with your medicines.  Do not drink alcohol when taking opioids.  Do not use illegal drugs.  Do not take opioid medicines that are not prescribed for you. Contact a health care provider if:  Your symptoms return.  You develop new symptoms or side effects when you are taking medicines. Get help right away if:  You think that you or someone else may have taken too much of an opioid. The hotline of the National Poison Control Center is (800) 222-1222.  You or someone else is having symptoms of an opioid overdose.  You have serious thoughts about hurting yourself or others.  You have: ? Chest pain. ? Difficulty breathing. ? A loss of consciousness. Opioid overdose is an emergency. Do not wait to see if the symptoms will go away. Get medical help right away. Call your local emergency services (911 in the [place] Do not drive yourself to the hospital. This information is not intended to replace advice given to you by your health care provider. Make sure you discuss any questions you have with your health care provider. Document Released: 05/08/2004 Document Revised: 10/16/2016 Document Reviewed: 09/14/2014 Elsevier  Interactive Patient Education  2019 [employer]  

## 2018-05-28 NOTE — Progress Notes (Signed)
Intrathecal pump increased 20% to 959.6 mcg/day as ordered

## 2018-05-29 ENCOUNTER — Other Ambulatory Visit: Payer: Self-pay

## 2018-05-29 ENCOUNTER — Encounter (HOSPITAL_BASED_OUTPATIENT_CLINIC_OR_DEPARTMENT_OTHER): Payer: Self-pay | Admitting: *Deleted

## 2018-05-29 ENCOUNTER — Emergency Department (HOSPITAL_BASED_OUTPATIENT_CLINIC_OR_DEPARTMENT_OTHER): Payer: No Typology Code available for payment source

## 2018-05-29 ENCOUNTER — Inpatient Hospital Stay (HOSPITAL_BASED_OUTPATIENT_CLINIC_OR_DEPARTMENT_OTHER)
Admission: EM | Admit: 2018-05-29 | Discharge: 2018-06-03 | DRG: 292 | Disposition: A | Discharge status: Home / Self Care | Payer: No Typology Code available for payment source | Attending: Internal Medicine | Admitting: Internal Medicine

## 2018-05-29 DIAGNOSIS — Z9119 Patient's noncompliance with other medical treatment and regimen: Secondary | ICD-10-CM

## 2018-05-29 DIAGNOSIS — R296 Repeated falls: Secondary | ICD-10-CM | POA: Diagnosis present

## 2018-05-29 DIAGNOSIS — G4733 Obstructive sleep apnea (adult) (pediatric): Secondary | ICD-10-CM | POA: Diagnosis present

## 2018-05-29 DIAGNOSIS — Z6841 Body Mass Index (BMI) 40.0 and over, adult: Secondary | ICD-10-CM

## 2018-05-29 DIAGNOSIS — I959 Hypotension, unspecified: Secondary | ICD-10-CM | POA: Diagnosis present

## 2018-05-29 DIAGNOSIS — I509 Heart failure, unspecified: Secondary | ICD-10-CM

## 2018-05-29 DIAGNOSIS — G253 Myoclonus: Secondary | ICD-10-CM | POA: Diagnosis present

## 2018-05-29 DIAGNOSIS — E291 Testicular hypofunction: Secondary | ICD-10-CM | POA: Diagnosis present

## 2018-05-29 DIAGNOSIS — Z79899 Other long term (current) drug therapy: Secondary | ICD-10-CM

## 2018-05-29 DIAGNOSIS — I5031 Acute diastolic (congestive) heart failure: Secondary | ICD-10-CM

## 2018-05-29 DIAGNOSIS — Z23 Encounter for immunization: Secondary | ICD-10-CM

## 2018-05-29 DIAGNOSIS — J45909 Unspecified asthma, uncomplicated: Secondary | ICD-10-CM | POA: Diagnosis present

## 2018-05-29 DIAGNOSIS — K219 Gastro-esophageal reflux disease without esophagitis: Secondary | ICD-10-CM | POA: Diagnosis not present

## 2018-05-29 DIAGNOSIS — G894 Chronic pain syndrome: Secondary | ICD-10-CM | POA: Diagnosis present

## 2018-05-29 DIAGNOSIS — D751 Secondary polycythemia: Secondary | ICD-10-CM | POA: Diagnosis present

## 2018-05-29 DIAGNOSIS — Z9884 Bariatric surgery status: Secondary | ICD-10-CM

## 2018-05-29 DIAGNOSIS — Z833 Family history of diabetes mellitus: Secondary | ICD-10-CM

## 2018-05-29 DIAGNOSIS — R51 Headache: Secondary | ICD-10-CM | POA: Diagnosis present

## 2018-05-29 DIAGNOSIS — I5033 Acute on chronic diastolic (congestive) heart failure: Principal | ICD-10-CM | POA: Diagnosis present

## 2018-05-29 DIAGNOSIS — F1123 Opioid dependence with withdrawal: Secondary | ICD-10-CM | POA: Diagnosis present

## 2018-05-29 DIAGNOSIS — M549 Dorsalgia, unspecified: Secondary | ICD-10-CM | POA: Diagnosis present

## 2018-05-29 DIAGNOSIS — Z96652 Presence of left artificial knee joint: Secondary | ICD-10-CM | POA: Diagnosis present

## 2018-05-29 LAB — CBC WITH DIFFERENTIAL/PLATELET
Abs Immature Granulocytes: 0.02 10*3/uL (ref 0.00–0.07)
Basophils Absolute: 0 10*3/uL (ref 0.0–0.1)
Basophils Relative: 0 %
EOS ABS: 0.1 10*3/uL (ref 0.0–0.5)
Eosinophils Relative: 2 %
HCT: 45.2 % (ref 39.0–52.0)
Hemoglobin: 13.2 g/dL (ref 13.0–17.0)
Immature Granulocytes: 0 %
Lymphocytes Relative: 20 %
Lymphs Abs: 1.1 10*3/uL (ref 0.7–4.0)
MCH: 27.8 pg (ref 26.0–34.0)
MCHC: 29.2 g/dL — ABNORMAL LOW (ref 30.0–36.0)
MCV: 95.2 fL (ref 80.0–100.0)
Monocytes Absolute: 0.4 10*3/uL (ref 0.1–1.0)
Monocytes Relative: 7 %
NEUTROS PCT: 71 %
Neutro Abs: 3.8 10*3/uL (ref 1.7–7.7)
Platelets: 243 10*3/uL (ref 150–400)
RBC: 4.75 MIL/uL (ref 4.22–5.81)
RDW: 13.7 % (ref 11.5–15.5)
WBC: 5.4 10*3/uL (ref 4.0–10.5)
nRBC: 0 % (ref 0.0–0.2)

## 2018-05-29 LAB — URINALYSIS, ROUTINE W REFLEX MICROSCOPIC
Bilirubin Urine: NEGATIVE
GLUCOSE, UA: NEGATIVE mg/dL
Hgb urine dipstick: NEGATIVE
KETONES UR: NEGATIVE mg/dL
Leukocytes,Ua: NEGATIVE
Nitrite: NEGATIVE
Protein, ur: NEGATIVE mg/dL
Specific Gravity, Urine: <1.005 — ABNORMAL LOW (ref 1.005–1.030)
pH: 6 (ref 5.0–8.0)

## 2018-05-29 LAB — COMPREHENSIVE METABOLIC PANEL
ALT: 28 U/L (ref 0–44)
AST: 27 U/L (ref 15–41)
Albumin: 3.5 g/dL (ref 3.5–5.0)
Alkaline Phosphatase: 71 U/L (ref 38–126)
Anion gap: 7 (ref 5–15)
BUN: 6 mg/dL (ref 6–20)
CO2: 28 mmol/L (ref 22–32)
Calcium: 8.3 mg/dL — ABNORMAL LOW (ref 8.9–10.3)
Chloride: 100 mmol/L (ref 98–111)
Creatinine, Ser: 0.8 mg/dL (ref 0.61–1.24)
GFR calc Af Amer: >60 mL/min (ref >60)
Glucose, Bld: 114 mg/dL — ABNORMAL HIGH (ref 70–99)
Potassium: 3.8 mmol/L (ref 3.5–5.1)
Sodium: 135 mmol/L (ref 135–145)
Total Bilirubin: 0.7 mg/dL (ref 0.3–1.2)
Total Protein: 7.1 g/dL (ref 6.5–8.1)

## 2018-05-29 LAB — TROPONIN I: Troponin I: <0.03 ng/mL (ref <0.03)

## 2018-05-29 LAB — BRAIN NATRIURETIC PEPTIDE: B NATRIURETIC PEPTIDE 5: 155.4 pg/mL — AB (ref 0.0–100.0)

## 2018-05-29 MED ORDER — PROMETHAZINE HCL 25 MG PO TABS: 25.0000 mg | ORAL_TABLET | Freq: Three times a day (TID) | ORAL | Status: DC

## 2018-05-29 MED ORDER — SODIUM CHLORIDE 0.9 % IV SOLN: 250.0000 mL | INTRAVENOUS | Status: DC | PRN

## 2018-05-29 MED ORDER — LISINOPRIL 10 MG PO TABS
10.0000 mg | ORAL_TABLET | Freq: Every day | ORAL | Status: DC
  Filled 2018-05-29: qty 1

## 2018-05-29 MED ORDER — SODIUM CHLORIDE 0.9% FLUSH
3.0000 mL | Freq: Two times a day (BID) | INTRAVENOUS | Status: DC
  Administered 2018-05-30 – 2018-06-03 (×10): 3 mL via INTRAVENOUS

## 2018-05-29 MED ORDER — ONDANSETRON HCL 4 MG/2ML IJ SOLN
4.0000 mg | Freq: Four times a day (QID) | INTRAMUSCULAR | Status: DC | PRN
  Administered 2018-05-30 – 2018-06-01 (×2): 4 mg via INTRAVENOUS
  Filled 2018-05-29 (×2): qty 2

## 2018-05-29 MED ORDER — ASPIRIN EC 81 MG PO TBEC
81.0000 mg | DELAYED_RELEASE_TABLET | Freq: Every day | ORAL | Status: DC
  Administered 2018-05-30 – 2018-06-03 (×5): 81 mg via ORAL
  Filled 2018-05-29 (×5): qty 1

## 2018-05-29 MED ORDER — ENOXAPARIN SODIUM 40 MG/0.4ML ~~LOC~~ SOLN
40.0000 mg | Freq: Every day | SUBCUTANEOUS | Status: DC
  Administered 2018-05-30: 40 mg via SUBCUTANEOUS
  Filled 2018-05-29: qty 0.4

## 2018-05-29 MED ORDER — FUROSEMIDE 10 MG/ML IJ SOLN
40.0000 mg | Freq: Two times a day (BID) | INTRAMUSCULAR | Status: DC
  Administered 2018-05-30 – 2018-06-01 (×5): 40 mg via INTRAVENOUS
  Filled 2018-05-29 (×6): qty 4

## 2018-05-29 MED ORDER — PROMETHAZINE HCL 25 MG/ML IJ SOLN
INTRAMUSCULAR | Status: AC
  Filled 2018-05-29: qty 1

## 2018-05-29 MED ORDER — CARVEDILOL 3.125 MG PO TABS
3.1250 mg | ORAL_TABLET | Freq: Two times a day (BID) | ORAL | Status: DC
  Administered 2018-05-30: 3.125 mg via ORAL
  Filled 2018-05-29: qty 1

## 2018-05-29 MED ORDER — CALCIUM POLYCARBOPHIL 625 MG PO TABS
625.0000 mg | ORAL_TABLET | Freq: Three times a day (TID) | ORAL | Status: DC
  Administered 2018-05-30 (×2): 625 mg via ORAL
  Filled 2018-05-29 (×4): qty 1

## 2018-05-29 MED ORDER — SODIUM CHLORIDE 0.9% FLUSH: 3.0000 mL | INTRAVENOUS | Status: DC | PRN

## 2018-05-29 MED ORDER — FUROSEMIDE 10 MG/ML IJ SOLN
40.0000 mg | Freq: Once | INTRAMUSCULAR | Status: AC
  Administered 2018-05-29: 40 mg via INTRAVENOUS
  Filled 2018-05-29: qty 4

## 2018-05-29 MED ORDER — HYDROMORPHONE HCL 1 MG/ML IJ SOLN
1.0000 mg | Freq: Once | INTRAMUSCULAR | Status: DC
  Filled 2018-05-29: qty 1

## 2018-05-29 MED ORDER — METHOCARBAMOL 1000 MG/10ML IJ SOLN
1000.0000 mg | Freq: Once | INTRAMUSCULAR | Status: AC
  Administered 2018-05-29: 1000 mg via INTRAMUSCULAR
  Filled 2018-05-29: qty 10

## 2018-05-29 MED ORDER — PROMETHAZINE HCL 25 MG/ML IJ SOLN
25.0000 mg | Freq: Once | INTRAMUSCULAR | Status: AC
  Administered 2018-05-29: 25 mg via INTRAMUSCULAR

## 2018-05-29 MED ORDER — HYDROMORPHONE HCL 1 MG/ML IJ SOLN
1.0000 mg | Freq: Once | INTRAMUSCULAR | Status: AC
  Administered 2018-05-29: 1 mg via INTRAVENOUS
  Filled 2018-05-29: qty 1

## 2018-05-29 MED ORDER — ACETAMINOPHEN 325 MG PO TABS
650.0000 mg | ORAL_TABLET | ORAL | Status: DC | PRN
  Administered 2018-05-30 – 2018-06-02 (×6): 650 mg via ORAL
  Filled 2018-05-29 (×6): qty 2

## 2018-05-29 MED ORDER — INFLUENZA VAC SPLIT QUAD 0.5 ML IM SUSY
0.5000 mL | PREFILLED_SYRINGE | INTRAMUSCULAR | Status: AC
  Administered 2018-06-03: 0.5 mL via INTRAMUSCULAR
  Filled 2018-05-29 (×2): qty 0.5

## 2018-05-29 NOTE — ED Notes (Addendum)
ED MD informed of reorted fall in BR , as reported by XR tech

## 2018-05-29 NOTE — ED Notes (Signed)
Pt states no improvement with pain level. Wanting to take dilaudid at this time. Nurse updated.

## 2018-05-29 NOTE — ED Notes (Signed)
Pt request to use restroom. Pt refused urinal, bedside commode and wheelchair to restroom. Pt followed to restroom and given instructions to use pull alarm for any asst. needed

## 2018-05-29 NOTE — ED Notes (Signed)
ED Provider at bedside. 

## 2018-05-29 NOTE — ED Notes (Addendum)
Ambulatory to BR with cane with assist of wife

## 2018-05-29 NOTE — ED Provider Notes (Signed)
[facility] EMERGENCY DEPARTMENT Provider Note   CSN: [id number] Arrival date & time: 05/29/18  1707     History   Chief Complaint Chief Complaint  Patient presents with  . Back Pain    HPI [name] is a 53 y.o. male.  HPI  53 year old male presents with generalized weakness and falls.  He has a longstanding history of chronic back pain and has a stimulator.  He was recently weaned off of morphine over several months and transition to fentanyl starting 5 days ago.  He feels like he is going through withdrawal with some nausea and vomiting, diffuse chills and body aches and pain.  He denies any dyspnea though family states he has been breathing harder.  His O2 sats at his pain specialist yesterday were 83% temporarily, came up with oxygen and then stayed up despite oxygen coming off.  The patient has been gaining approximately 100 pounds per his estimate since Thanksgiving and what he calls water weight.  He states his legs are swollen and his abdomen is swollen.  He had a BNP obtained yesterday and was told that it was diagnostic of heart failure and he was referred to cardiology but has not seen them.  He has been feeling generally weak and today has fallen multiple times because it feels like his legs give out from under him.  He has been having a headache that has been requiring Tylenol multiple times per day for a couple weeks.  The weakness is not just in his legs but also in his wrist/arms.  Seems to come and go, usually when he is walking without his walker. Has chronic RLE swelling compared to left, and now both are proportionally a little more swollen.  Past Medical History:  Diagnosis Date  . Arthritis   . Asthma    as child  . Back pain   . GERD (gastroesophageal reflux disease)   . Hernia   . History of hiatal hernia   . Hypogonadism in male   . Neuromuscular disorder (HCC)    back injury with surgery  . PONV (postoperative nausea and vomiting)    gets  nausea  . Shortness of breath dyspnea   . Sleep apnea    Does not use CPAP, new equipment 12/2014  . Ulcer   . Wears glasses     Patient Active Problem List   Diagnosis Date Noted  . Acute CHF (congestive heart failure) (HCC) 05/29/2018  . Withdrawal from opioids (HCC) 05/27/2018  . Morbid obesity with BMI of 50.0-59.9, adult (HCC) 05/25/2018  . Secondary osteoarthritis of multiple sites 05/25/2018  . Osteoarthritis of hip (Right) 05/25/2018  . Back pain   . Encounter for adjustment or management of infusion pump 04/01/2018  . Lumbar spondylosis 01/26/2018  . Spondylosis without myelopathy or radiculopathy, lumbosacral region 12/02/2017  . Lumbar facet syndrome (Bilateral) (R>L) 12/02/2017  . Chronic low back pain (Primary Area of Pain) (Bilateral) (R>L) 12/02/2017  . Chronic knee pain after total replacement (Left) 12/01/2017  . Chronic musculoskeletal pain 11/17/2017  . Muscle spasticity 11/17/2017  . Gastroesophageal reflux disease without esophagitis 11/17/2017  . Neurogenic pain 11/17/2017  . Drug-induced nausea and vomiting 11/17/2017  . Therapeutic opioid-induced constipation (OIC) 11/17/2017  . Chronic pain syndrome 11/09/2017  . Long term prescription benzodiazepine use 11/09/2017  . Long term prescription opiate use 11/09/2017  . Opiate use (> 150 MME/day) 11/09/2017  . Long-term use of high-risk medication 11/09/2017  . Pharmacologic therapy 11/09/2017  .  Disorder of skeletal system 11/09/2017  . Problems influencing health status 11/09/2017  . Chronic nausea s/p bariatric surgery 11/09/2017  . Presence of intrathecal pump 11/09/2017  . Encounter for interrogation of infusion pump 11/09/2017  . Chronic foot pain (Right) 11/09/2017  . Chronic hip pain (Right) 11/09/2017  . Old tear of medial meniscus of knee (Left) 11/09/2017  . Failed back surgical syndrome 11/09/2017  . Chronic low back pain (Bilateral) w/ sciatica (Bilateral) 11/09/2017  . Chronic lower  extremity pain (Bilateral) (L>R) 11/09/2017  . Chronic lower extremity radicular pain 11/09/2017  . Chronic shoulder pain (Bilateral) 11/09/2017  . Hx of total knee replacement (Left) 11/09/2017  . History of arthroscopic surgery of shoulder (Bilateral) 11/09/2017  . S/P laparoscopic sleeve gastrectomy Sept 2016 01/08/2015  . Severe obesity (BMI >= 40) (HCC) 10/21/2014  . OSA complicated by mild polycythemia  10/21/2014  . Polycythemia, secondary 10/21/2014  . Dyspnea 10/20/2014  . S/P rotator cuff repair (Bilateral) 08/18/2013  . Chronic knee pain (Left) 02/28/2008  . DDD (degenerative disc disease), lumbosacral 10/20/2006    Past Surgical History:  Procedure Laterality Date  . BACK SURGERY    . JOINT REPLACEMENT     Left knee replacement  . LAPAROSCOPIC GASTRIC SLEEVE RESECTION N/A 01/08/2015   Procedure: LAPAROSCOPIC GASTRIC SLEEVE RESECTION;  Surgeon: Luretha Murphy, MD;  Location: [facility];  Service: General;  Laterality: N/A;  . morphine pump    . SHOULDER ARTHROSCOPY WITH SUBACROMIAL DECOMPRESSION AND BICEP TENDON REPAIR Left 10/06/2013   Procedure: LEFT SHOULDER ARTHROSCOPY DEBRIDEMENT EXTENTSIVE,DISTAL CLAVICULECTOMY,DECOMPRESSION SUBACROMIAL PARTIAL ACROMIOPLASTY WITH ROTATOR CUFF REPAIR, and excision of CALCIUM DEPOSIT;  Surgeon: Loreta Ave, MD;  Location:  SURGERY CENTER;  Service: Orthopedics;  Laterality: Left;  . SHOULDER ARTHROSCOPY WITH SUBACROMIAL DECOMPRESSION, ROTATOR CUFF REPAIR AND BICEP TENDON REPAIR Right 08/18/2013   Procedure: RIGHT SHOULDER ARTHROSCOPY WITH SUBACROMIAL DECOMPRESSION/PARTIAL ACROMIOPLASTY WITH CORACOACROMIAL RELEASE/DISTAL CLAVICULECTOMY/ ROTATOR CUFF REPAIR/DEBRIDEMENT EXTENTSIVE;  Surgeon: Loreta Ave, MD;  Location:  SURGERY CENTER;  Service: Orthopedics;  Laterality: Right;  ANESTHESIA: GENERAL, PRE/POST OP SCALENE  . TONSILLECTOMY          Home Medications    Prior to Admission medications   Medication Sig  Start Date End Date Taking? Authorizing Provider  baclofen (LIORESAL) 10 MG tablet Take 1 tablet (10 mg total) by mouth 4 (four) times daily. 06/30/18 08/29/18  [name], MD  cloNIDine (CATAPRES - DOSED IN MG/24 HR) 0.1 mg/24hr patch Place 1 patch (0.1 mg total) onto the skin once a week for 14 days. Discontinue if blood pressure is too low. Patient not taking: Reported on 05/28/2018 05/27/18 06/10/18  [name], MD  esomeprazole (NEXIUM) 40 MG capsule Take 1 capsule (40 mg total) by mouth 2 (two) times daily. 06/30/18 08/29/18  [name], MD  Gabapentin Enacarbil (HORIZANT) 600 MG TBCR Take 1 tablet (600 mg total) by mouth 2 (two) times daily. 06/30/18 08/29/18  [name], MD  GNP MILK OF MAGNESIA 1200 MG/15ML suspension Take 5 mLs by mouth as needed. 05/05/18   [provider]  Magnesium Oxide 500 MG CAPS Take 1 capsule (500 mg total) by mouth 2 (two) times daily at 8 am and 10 pm. 06/30/18 08/29/18  [name], MD  NON FORMULARY 959 mcg by Intrathecal route daily. IT pump Fentanyl 2,000.0 mcg/ml Bupivicaine 20.0 mg/ml 40 ml pump    [provider]  ondansetron (ZOFRAN-ODT) 8 MG disintegrating tablet Take 1 tablet (8 mg total) by mouth every 8 (  eight) hours as needed for nausea or vomiting. 06/30/18 08/29/18  [name], MD  Oxycodone HCl 10 MG TABS Take 1 tablet (10 mg total) by mouth every 6 (six) hours as needed. Must last 30 days. Max: 4/day. 05/31/18 06/30/18  [name], MD  Oxycodone HCl 10 MG TABS Take 1 tablet (10 mg total) by mouth every 6 (six) hours as needed for up to 30 days. Must last 30 days. Max: 4/day. 07/30/18 08/29/18  [name], MD  Oxycodone HCl 10 MG TABS Take 1 tablet (10 mg total) by mouth every 6 (six) hours as needed for up to 30 days. Must last 30 days. Max: 4/day. 06/30/18 07/30/18  [name], MD  polycarbophil (FIBERCON) 625 MG tablet Take 625 mg by mouth 3 (three) times daily.     [provider]  PRESCRIPTION MEDICATION morphine ambulatory pump    [provider]  promethazine (PHENERGAN) 25 MG tablet Take 25 mg by mouth every 8 (eight) hours. 05/12/18   [provider]  Wheat Dextrin (BENEFIBER) POWD Take 6 g by mouth 3 (three) times daily before meals. (2 tsp = 6 g). Stir into 8 oz of water, or sprinkle over food. 06/30/18 08/29/18  [name], MD    Family History Family History  Problem Relation Age of Onset  . Diabetes Mother   . Pneumonia Father     Social History Social History   Tobacco Use  . Smoking status: Never Smoker  . Smokeless tobacco: Current User    Types: Snuff  Substance Use Topics  . Alcohol use: No  . Drug use: No     Allergies   Tape   Review of Systems Review of Systems  Constitutional: Negative for fever.  Respiratory: Negative for shortness of breath.   Cardiovascular: Positive for leg swelling. Negative for chest pain.  Gastrointestinal: Positive for abdominal distention, diarrhea (chronic per patient) and vomiting. Negative for abdominal pain.  Musculoskeletal: Positive for back pain (chronic, unchanged).  Neurological: Positive for weakness, light-headedness and headaches. Negative for dizziness.  All other systems reviewed and are negative.    Physical Exam Updated Vital Signs BP 125/60 (BP Location: Left Arm)   Pulse 76   Temp 99.7 F (37.6 C) (Oral)   Resp 20   Ht 5\' 10"  (1.778 m)   Wt (!) 195 kg   SpO2 91%   BMI 61.68 kg/m   Physical Exam Vitals signs and nursing note reviewed.  Constitutional:      General: He is not in acute distress.    Appearance: He is well-developed. He is not ill-appearing or diaphoretic.     Comments: Morbidly obese  HENT:     Head: Normocephalic and atraumatic.     Right Ear: External ear normal.     Left Ear: External ear normal.     Nose: Nose normal.  Eyes:     General:        Right eye: No discharge.        Left eye: No discharge.  Neck:       Musculoskeletal: Neck supple.  Cardiovascular:     Rate and Rhythm: Normal rate and regular rhythm.     Heart sounds: Normal heart sounds.  Pulmonary:     Effort: Pulmonary effort is normal.     Breath sounds: Normal breath sounds. No wheezing or rales.  Abdominal:     Palpations: Abdomen is soft.     Tenderness: There is no abdominal tenderness.  Musculoskeletal:  Right lower leg: Edema present.     Left lower leg: Edema present.     Comments: Mild pitting edema to BLE. RLE is chronically larger than left.  Skin:    General: Skin is warm and dry.  Neurological:     Mental Status: He is alert.     Comments: CN 3-12 grossly intact. 5/5 strength in all 4 extremities. Grossly normal sensation. Normal finger to nose. Able to ambulate without assistance  Psychiatric:        Mood and Affect: Mood is not anxious.      ED Treatments / Results  Labs (all labs ordered are listed, but only abnormal results are displayed) Labs Reviewed  BRAIN NATRIURETIC PEPTIDE - Abnormal; Notable for the following components:      Result Value   B Natriuretic Peptide 155.4 (*)    All other components within normal limits  CBC WITH DIFFERENTIAL/PLATELET - Abnormal; Notable for the following components:   MCHC 29.2 (*)    All other components within normal limits  URINALYSIS, ROUTINE W REFLEX MICROSCOPIC - Abnormal; Notable for the following components:   Specific Gravity, Urine <1.005 (*)    All other components within normal limits  COMPREHENSIVE METABOLIC PANEL - Abnormal; Notable for the following components:   Glucose, Bld 114 (*)    Calcium 8.3 (*)    All other components within normal limits  TROPONIN I  HIV ANTIBODY (ROUTINE TESTING W REFLEX)  BASIC METABOLIC PANEL  CBC  CREATININE, SERUM  CBC WITH DIFFERENTIAL/PLATELET  TROPONIN I  TROPONIN I  TROPONIN I    EKG None  Radiology Dg Chest 2 View  Result Date: 05/29/2018 CLINICAL DATA:  Generalized weakness, shortness of  breath, multiple falls in last several months, CHF, GERD EXAM: CHEST - 2 VIEW COMPARISON:  01/04/2015 FINDINGS: Enlargement of cardiac silhouette with pulmonary vascular congestion. Enlarged central pulmonary arteries. Mediastinal contours normal. Slight chronic accentuation of perihilar markings little changed. No definite acute infiltrate, pleural effusion or pneumothorax. Bones unremarkable. IMPRESSION: Enlargement of cardiac silhouette with pulmonary vascular congestion. Question pulmonary arterial hypertension. No acute abnormalities. Electronically Signed   By: Ulyses Southward M.D.   On: 05/29/2018 18:47   Ct Head Wo Contrast  Result Date: 05/29/2018 CLINICAL DATA:  Multiple recent falls, generalized weakness, visual changes and memory loss. EXAM: CT HEAD WITHOUT CONTRAST TECHNIQUE: Contiguous axial images were obtained from the base of the skull through the vertex without intravenous contrast. COMPARISON:  Head CT dated 12/22/2011. FINDINGS: Brain: Ventricles are normal in size. There is no mass, hemorrhage, edema or other evidence of acute parenchymal abnormality. No extra-axial hemorrhage. Vascular: No hyperdense vessel or unexpected calcification. Skull: Normal. Negative for fracture or focal lesion. Sinuses/Orbits: No acute finding. Other: None. IMPRESSION: Negative head CT. No intracranial mass, hemorrhage or edema. Electronically Signed   By: Bary Richard M.D.   On: 05/29/2018 18:44    Procedures Procedures (including critical care time)  Medications Ordered in ED Medications  promethazine (PHENERGAN) 25 MG/ML injection (has no administration in time range)  Influenza vac split quadrivalent PF (FLUARIX) injection 0.5 mL (has no administration in time range)  polycarbophil (FIBERCON) tablet 625 mg (has no administration in time range)  promethazine (PHENERGAN) tablet 25 mg (has no administration in time range)  sodium chloride flush (NS) 0.9 % injection 3 mL (has no administration in time  range)  sodium chloride flush (NS) 0.9 % injection 3 mL (has no administration in time range)  0.9 %  sodium  chloride infusion (has no administration in time range)  acetaminophen (TYLENOL) tablet 650 mg (has no administration in time range)  ondansetron (ZOFRAN) injection 4 mg (has no administration in time range)  enoxaparin (LOVENOX) injection 40 mg (has no administration in time range)  furosemide (LASIX) injection 40 mg (has no administration in time range)  lisinopril (PRINIVIL,ZESTRIL) tablet 10 mg (has no administration in time range)  carvedilol (COREG) tablet 3.125 mg (has no administration in time range)  aspirin EC tablet 81 mg (has no administration in time range)  promethazine (PHENERGAN) injection 25 mg (25 mg Intramuscular Given 05/29/18 1924)  methocarbamol (ROBAXIN) injection 1,000 mg (1,000 mg Intramuscular Given 05/29/18 1923)  furosemide (LASIX) injection 40 mg (40 mg Intravenous Given 05/29/18 2031)  HYDROmorphone (DILAUDID) injection 1 mg (1 mg Intravenous Given 05/29/18 2044)     Initial Impression / Assessment and Plan / ED Course  I have reviewed the triage vital signs and the nursing notes.  Pertinent labs & imaging results that were available during my care of the patient were reviewed by me and considered in my medical decision making (see chart for details).     Patient is mildly hypoxic here, and placed on 2 L oxygen with good response.  Chest x-ray shows some congestion and in combination with his weight gain and [facility]-of-the-road BNP, this is concerning for new CHF.  Otherwise labs are overall reassuring.  No significant electrolyte disturbance to cause the weakness.  Perhaps hypoxia and/or the CHF is causing his recurrent falls.  Doubt acute stroke.  He will need admission for diuresis and further work-up.  Discussed with Dr. Mikeal Hawthorne, who will admit.  Final Clinical Impressions(s) / ED Diagnoses   Final diagnoses:  Acute congestive heart failure, unspecified  heart failure type [facility])    ED Discharge Orders    None       Pricilla Loveless, MD 05/30/18 0008

## 2018-05-29 NOTE — H&P (Signed)
History and Physical   [name]:[id number] DOB: 07/19/1965 DOA: 05/29/2018  Referring MD/NP/PA: Dr. Lawrence Marseilles From [facility]  PCP: System, Pcp Not In   Outpatient Specialists: None   Patient coming from: [facility]  Chief Complaint: Shortness of Breath  HPI: [name] is a 53 y.o. male with medical history significant of asthma, chronic pain syndrome due to back pain, obstructive sleep apnea recent stimulator placement, morbid obesity who presented to bedside to [facility] with progressive shortness of breath and cough.  Patient reported orthopnea and PND.  His shortness of breath is more with exertion.  No prior known history of heart disease.  Patient has extensive chronic pain with fentanyl patch in place that was just added yesterday.  Also has clonidine patch as well as oxycodone.  He has had multiple joint problems.  Also had bariatric surgery but has been noted gain of up to 50 pounds between Thanksgiving and now.  Mostly he believes is fluids.  ED Course: Temperature is 98 blood pressure 123/67 pulse 88 respirate of 24 oxygen sat 86% on room air currently 90% on 2 L.  BMP seems to be all within normal limits.  BNP is 154 with negative troponin.  CBC is within normal limits.  Chest x-ray shows enlargement of cardiac silhouette with pulmonary congestion and questionable pulmonary hypertension.  Urinalysis is negative.  Patient is therefore being admitted with new onset CHF probably diastolic.  Review of Systems: As per HPI otherwise 10 point review of systems negative.    Past Medical History:  Diagnosis Date  . Arthritis   . Asthma    as child  . Back pain   . GERD (gastroesophageal reflux disease)   . Hernia   . History of hiatal hernia   . Hypogonadism in male   . Neuromuscular disorder (HCC)    back injury with surgery  . PONV (postoperative nausea and vomiting)    gets nausea  . Shortness of breath dyspnea   . Sleep apnea    Does not use CPAP, new equipment 12/2014   . Ulcer   . Wears glasses     Past Surgical History:  Procedure Laterality Date  . BACK SURGERY    . JOINT REPLACEMENT     Left knee replacement  . LAPAROSCOPIC GASTRIC SLEEVE RESECTION N/A 01/08/2015   Procedure: LAPAROSCOPIC GASTRIC SLEEVE RESECTION;  Surgeon: Luretha Murphy, MD;  Location: [facility];  Service: General;  Laterality: N/A;  . morphine pump    . SHOULDER ARTHROSCOPY WITH SUBACROMIAL DECOMPRESSION AND BICEP TENDON REPAIR Left 10/06/2013   Procedure: LEFT SHOULDER ARTHROSCOPY DEBRIDEMENT EXTENTSIVE,DISTAL CLAVICULECTOMY,DECOMPRESSION SUBACROMIAL PARTIAL ACROMIOPLASTY WITH ROTATOR CUFF REPAIR, and excision of CALCIUM DEPOSIT;  Surgeon: Loreta Ave, MD;  Location: [facility];  Service: Orthopedics;  Laterality: Left;  . SHOULDER ARTHROSCOPY WITH SUBACROMIAL DECOMPRESSION, ROTATOR CUFF REPAIR AND BICEP TENDON REPAIR Right 08/18/2013   Procedure: RIGHT SHOULDER ARTHROSCOPY WITH SUBACROMIAL DECOMPRESSION/PARTIAL ACROMIOPLASTY WITH CORACOACROMIAL RELEASE/DISTAL CLAVICULECTOMY/ ROTATOR CUFF REPAIR/DEBRIDEMENT EXTENTSIVE;  Surgeon: Loreta Ave, MD;  Location: [facility];  Service: Orthopedics;  Laterality: Right;  ANESTHESIA: GENERAL, PRE/POST OP SCALENE  . TONSILLECTOMY       reports that he has never smoked. His smokeless tobacco use includes snuff. He reports that he does not drink alcohol or use drugs.  Allergies  Allergen Reactions  . Tape Other (See Comments)    Blisters     Family History  Problem Relation Age of Onset  . Diabetes  Mother   . Pneumonia Father      Prior to Admission medications   Medication Sig Start Date End Date Taking? Authorizing Provider  baclofen (LIORESAL) 10 MG tablet Take 1 tablet (10 mg total) by mouth 4 (four) times daily. 06/30/18 08/29/18  Delano MetzNaveira, Francisco, MD  cloNIDine (CATAPRES - DOSED IN MG/24 HR) 0.1 mg/24hr patch Place 1 patch (0.1 mg total) onto the skin once a week for 14 days. Discontinue if  blood pressure is too low. Patient not taking: Reported on 05/28/2018 05/27/18 06/10/18  Delano MetzNaveira, Francisco, MD  esomeprazole (NEXIUM) 40 MG capsule Take 1 capsule (40 mg total) by mouth 2 (two) times daily. 06/30/18 08/29/18  Delano MetzNaveira, Francisco, MD  Gabapentin Enacarbil (HORIZANT) 600 MG TBCR Take 1 tablet (600 mg total) by mouth 2 (two) times daily. 06/30/18 08/29/18  Delano MetzNaveira, Francisco, MD  GNP MILK OF MAGNESIA 1200 MG/15ML suspension Take 5 mLs by mouth as needed. 05/05/18   [provider]  Magnesium Oxide 500 MG CAPS Take 1 capsule (500 mg total) by mouth 2 (two) times daily at 8 am and 10 pm. 06/30/18 08/29/18  Delano MetzNaveira, Francisco, MD  NON FORMULARY 959 mcg by Intrathecal route daily. IT pump Fentanyl 2,000.0 mcg/ml Bupivicaine 20.0 mg/ml 40 ml pump    [provider]  ondansetron (ZOFRAN-ODT) 8 MG disintegrating tablet Take 1 tablet (8 mg total) by mouth every 8 (eight) hours as needed for nausea or vomiting. 06/30/18 08/29/18  Delano MetzNaveira, Francisco, MD  Oxycodone HCl 10 MG TABS Take 1 tablet (10 mg total) by mouth every 6 (six) hours as needed. Must last 30 days. Max: 4/day. 05/31/18 06/30/18  Delano MetzNaveira, Francisco, MD  Oxycodone HCl 10 MG TABS Take 1 tablet (10 mg total) by mouth every 6 (six) hours as needed for up to 30 days. Must last 30 days. Max: 4/day. 07/30/18 08/29/18  Delano MetzNaveira, Francisco, MD  Oxycodone HCl 10 MG TABS Take 1 tablet (10 mg total) by mouth every 6 (six) hours as needed for up to 30 days. Must last 30 days. Max: 4/day. 06/30/18 07/30/18  Delano MetzNaveira, Francisco, MD  polycarbophil (FIBERCON) 625 MG tablet Take 625 mg by mouth 3 (three) times daily.     [provider]  PRESCRIPTION MEDICATION morphine ambulatory pump    [provider]  promethazine (PHENERGAN) 25 MG tablet Take 25 mg by mouth every 8 (eight) hours. 05/12/18   [provider]  Wheat Dextrin (BENEFIBER) POWD Take 6 g by mouth 3 (three) times daily before meals. (2 tsp = 6 g). Stir into 8 oz  of water, or sprinkle over food. 06/30/18 08/29/18  Delano MetzNaveira, Francisco, MD    Physical Exam: Vitals:   05/29/18 2030 05/29/18 2035 05/29/18 2136 05/29/18 2255  BP:  131/83 (!) 131/53 125/60  Pulse: 73 71 69 76  Resp: 14 20 18 20   Temp:    99.7 F (37.6 C)  TempSrc:    Oral  SpO2: 99% 100% 100% 91%  Weight:      Height:          Constitutional: NAD, calm, comfortable, morbidly obese Vitals:   05/29/18 2030 05/29/18 2035 05/29/18 2136 05/29/18 2255  BP:  131/83 (!) 131/53 125/60  Pulse: 73 71 69 76  Resp: 14 20 18 20   Temp:    99.7 F (37.6 C)  TempSrc:    Oral  SpO2: 99% 100% 100% 91%  Weight:      Height:       Eyes: PERRL, lids and  conjunctivae normal ENMT: Mucous membranes are moist. Posterior pharynx clear of any exudate or lesions.Normal dentition.  Neck: normal, supple, no masses, no thyromegaly Respiratory: Good air entry bilaterally with diffuse crackles. Normal respiratory effort. No accessory muscle use.  No wheeze Cardiovascular: Regular rate and rhythm, no murmurs / rubs / gallops. 2+ extremity edema. 2+ pedal pulses. No carotid bruits.  Abdomen: no tenderness, no masses palpated. No hepatosplenomegaly. Bowel sounds positive.  Musculoskeletal: no clubbing / cyanosis. No joint deformity upper and lower extremities. Good ROM, no contractures. Normal muscle tone.  Skin: no rashes, lesions, ulcers. No induration Neurologic: CN 2-12 grossly intact. Sensation intact, DTR normal. Strength 5/5 in all 4.  Psychiatric: Normal judgment and insight. Alert and oriented x 3. Normal mood.     Labs on Admission: I have personally reviewed following labs and imaging studies  CBC: Recent Labs  Lab 05/29/18 1825  WBC 5.4  NEUTROABS 3.8  HGB 13.2  HCT 45.2  MCV 95.2  PLT 243   Basic Metabolic Panel: Recent Labs  Lab 05/29/18 1934  NA 135  K 3.8  CL 100  CO2 28  GLUCOSE 114*  BUN 6  CREATININE 0.80  CALCIUM 8.3*   GFR: Estimated Creatinine Clearance: 188.2  mL/min (by C-G formula based on SCr of 0.8 mg/dL). Liver Function Tests: Recent Labs  Lab 05/29/18 1934  AST 27  ALT 28  ALKPHOS 71  BILITOT 0.7  PROT 7.1  ALBUMIN 3.5   No results for input(s): LIPASE, AMYLASE in the last 168 hours. No results for input(s): AMMONIA in the last 168 hours. Coagulation Profile: No results for input(s): INR, PROTIME in the last 168 hours. Cardiac Enzymes: Recent Labs  Lab 05/29/18 1825  TROPONINI <0.03   BNP (last 3 results) No results for input(s): PROBNP in the last 8760 hours. HbA1C: No results for input(s): HGBA1C in the last 72 hours. CBG: No results for input(s): GLUCAP in the last 168 hours. Lipid Profile: No results for input(s): CHOL, HDL, LDLCALC, TRIG, CHOLHDL, LDLDIRECT in the last 72 hours. Thyroid Function Tests: No results for input(s): TSH, T4TOTAL, FREET4, T3FREE, THYROIDAB in the last 72 hours. Anemia Panel: No results for input(s): VITAMINB12, FOLATE, FERRITIN, TIBC, IRON, RETICCTPCT in the last 72 hours. Urine analysis:    Component Value Date/Time   COLORURINE YELLOW 05/29/2018 1816   APPEARANCEUR CLEAR 05/29/2018 1816   LABSPEC <1.005 (L) 05/29/2018 1816   PHURINE 6.0 05/29/2018 1816   GLUCOSEU NEGATIVE 05/29/2018 1816   HGBUR NEGATIVE 05/29/2018 1816   BILIRUBINUR NEGATIVE 05/29/2018 1816   KETONESUR NEGATIVE 05/29/2018 1816   PROTEINUR NEGATIVE 05/29/2018 1816   UROBILINOGEN 0.2 08/13/2010 1206   NITRITE NEGATIVE 05/29/2018 1816   LEUKOCYTESUR NEGATIVE 05/29/2018 1816   Sepsis Labs: @LABRCNTIP (procalcitonin:4,lacticidven:4) )No results found for this or any previous visit (from the past 240 hour(s)).   Radiological Exams on Admission: Dg Chest 2 View  Result Date: 05/29/2018 CLINICAL DATA:  Generalized weakness, shortness of breath, multiple falls in last several months, CHF, GERD EXAM: CHEST - 2 VIEW COMPARISON:  01/04/2015 FINDINGS: Enlargement of cardiac silhouette with pulmonary vascular congestion.  Enlarged central pulmonary arteries. Mediastinal contours normal. Slight chronic accentuation of perihilar markings little changed. No definite acute infiltrate, pleural effusion or pneumothorax. Bones unremarkable. IMPRESSION: Enlargement of cardiac silhouette with pulmonary vascular congestion. Question pulmonary arterial hypertension. No acute abnormalities. Electronically Signed   By: Ulyses Southward M.D.   On: 05/29/2018 18:47   Ct Head Wo Contrast  Result Date: 05/29/2018  CLINICAL DATA:  Multiple recent falls, generalized weakness, visual changes and memory loss. EXAM: CT HEAD WITHOUT CONTRAST TECHNIQUE: Contiguous axial images were obtained from the base of the skull through the vertex without intravenous contrast. COMPARISON:  Head CT dated 12/22/2011. FINDINGS: Brain: Ventricles are normal in size. There is no mass, hemorrhage, edema or other evidence of acute parenchymal abnormality. No extra-axial hemorrhage. Vascular: No hyperdense vessel or unexpected calcification. Skull: Normal. Negative for fracture or focal lesion. Sinuses/Orbits: No acute finding. Other: None. IMPRESSION: Negative head CT. No intracranial mass, hemorrhage or edema. Electronically Signed   By: Bary Richard M.D.   On: 05/29/2018 18:44    EKG: Independently reviewed.  It showed normal sinus rhythm with a rate of 87, low voltage EKG, flipped T waves in the inferolateral region, no old EKG to compare.  Assessment/Plan Principal Problem:   Acute CHF (congestive heart failure) (HCC) Active Problems:   OSA complicated by mild polycythemia    Chronic pain syndrome   Gastroesophageal reflux disease without esophagitis   Morbid obesity with BMI of 50.0-59.9, adult (HCC)     #1 new onset CHF: Patient appears to be fluid overloaded.  He has lower extremity edema and chest x-ray with pulmonary edema.  He has responded to initial IV Lasix in the ER.  We will admit the patient and get echocardiogram.  Continue with IV Lasix 40 mg  twice daily.  I suspect this to be diastolic in nature.  Empirically on ACE inhibitor and beta blockers until echocardiogram is done.  Monitor closely.  #2 chronic pain syndrome: Patient on multiple pain medications including his fentanyl pump.  We will continue with home regimen while in the hospital.  #3 morbid obesity: Status post bariatric surgery.  Still patient has BMI of more than 40.  Dietary counseling.  Patient reports not eating much.  #4 GERD: Patient is on Nexium at home.  Initiate Protonix while in the hospital.   DVT prophylaxis: Lovenox Code Status: Full code Family Communication: Wife with patient in the room Disposition Plan: Home Consults called: None Admission status: Observation  Severity of Illness: The appropriate patient status for this patient is OBSERVATION. Observation status is judged to be reasonable and necessary in order to provide the required intensity of service to ensure the patient's safety. The patient's presenting symptoms, physical exam findings, and initial radiographic and laboratory data in the context of their medical condition is felt to place them at decreased risk for further clinical deterioration. Furthermore, it is anticipated that the patient will be medically stable for discharge from the hospital within 2 midnights of admission. The following factors support the patient status of observation.   " The patient's presenting symptoms include shortness of breath. " The physical exam findings include morbid obesity with lower extremity edema. " The initial radiographic and laboratory data are chest x-ray showing CHF.     Lonia Blood MD [facility] Pager [phone]  If 7PM-7AM, please contact night-coverage [email or web address] Password [facility]  05/29/2018, 11:24 PM

## 2018-05-29 NOTE — ED Notes (Signed)
[facility] at bedside 

## 2018-05-29 NOTE — ED Notes (Signed)
Family at bedside. 

## 2018-05-29 NOTE — ED Notes (Signed)
*  Regarding previous ED note*   There was a misunderstanding in relay of information to the Charge Nurse. Pt did not fall while in xray. I went to get the pt for imaging studies and the patient was in the bathroom already and I went to check on him as it had been a while and when he came back into the room he told me he had fallen in the bathroom. I asked if he was okay and if he needed to talk to the MD about it and he declined.

## 2018-05-29 NOTE — ED Notes (Signed)
Patient transported to CT and xray 

## 2018-05-29 NOTE — ED Triage Notes (Signed)
Pt has an intrathecal pump for pain medications. This week it was switched from morphine to fentanyl and has not been effective. He was seen by PCP yesterday and dx with CHF. Pt states he has been feeling weak and has fallen 3-4 times today

## 2018-05-30 ENCOUNTER — Observation Stay (HOSPITAL_BASED_OUTPATIENT_CLINIC_OR_DEPARTMENT_OTHER): Payer: No Typology Code available for payment source

## 2018-05-30 DIAGNOSIS — R51 Headache: Secondary | ICD-10-CM | POA: Diagnosis present

## 2018-05-30 DIAGNOSIS — M549 Dorsalgia, unspecified: Secondary | ICD-10-CM | POA: Diagnosis present

## 2018-05-30 DIAGNOSIS — Z9884 Bariatric surgery status: Secondary | ICD-10-CM | POA: Diagnosis not present

## 2018-05-30 DIAGNOSIS — J45909 Unspecified asthma, uncomplicated: Secondary | ICD-10-CM | POA: Diagnosis present

## 2018-05-30 DIAGNOSIS — I5033 Acute on chronic diastolic (congestive) heart failure: Secondary | ICD-10-CM | POA: Diagnosis not present

## 2018-05-30 DIAGNOSIS — Z451 Encounter for adjustment and management of infusion pump: Secondary | ICD-10-CM | POA: Diagnosis present

## 2018-05-30 DIAGNOSIS — I5031 Acute diastolic (congestive) heart failure: Secondary | ICD-10-CM | POA: Diagnosis not present

## 2018-05-30 DIAGNOSIS — Z833 Family history of diabetes mellitus: Secondary | ICD-10-CM | POA: Diagnosis not present

## 2018-05-30 DIAGNOSIS — K219 Gastro-esophageal reflux disease without esophagitis: Secondary | ICD-10-CM | POA: Diagnosis present

## 2018-05-30 DIAGNOSIS — Z23 Encounter for immunization: Secondary | ICD-10-CM | POA: Diagnosis not present

## 2018-05-30 DIAGNOSIS — M961 Postlaminectomy syndrome, not elsewhere classified: Secondary | ICD-10-CM | POA: Diagnosis present

## 2018-05-30 DIAGNOSIS — M5442 Lumbago with sciatica, left side: Secondary | ICD-10-CM | POA: Diagnosis present

## 2018-05-30 DIAGNOSIS — R0602 Shortness of breath: Secondary | ICD-10-CM

## 2018-05-30 DIAGNOSIS — M5441 Lumbago with sciatica, right side: Secondary | ICD-10-CM | POA: Diagnosis present

## 2018-05-30 DIAGNOSIS — R609 Edema, unspecified: Secondary | ICD-10-CM | POA: Diagnosis not present

## 2018-05-30 DIAGNOSIS — Z978 Presence of other specified devices: Secondary | ICD-10-CM | POA: Diagnosis present

## 2018-05-30 DIAGNOSIS — I959 Hypotension, unspecified: Secondary | ICD-10-CM | POA: Diagnosis present

## 2018-05-30 DIAGNOSIS — G8929 Other chronic pain: Secondary | ICD-10-CM | POA: Diagnosis present

## 2018-05-30 DIAGNOSIS — D751 Secondary polycythemia: Secondary | ICD-10-CM | POA: Diagnosis present

## 2018-05-30 DIAGNOSIS — M545 Low back pain: Secondary | ICD-10-CM | POA: Diagnosis present

## 2018-05-30 DIAGNOSIS — Z6841 Body Mass Index (BMI) 40.0 and over, adult: Secondary | ICD-10-CM | POA: Diagnosis not present

## 2018-05-30 DIAGNOSIS — Z79899 Other long term (current) drug therapy: Secondary | ICD-10-CM | POA: Diagnosis not present

## 2018-05-30 DIAGNOSIS — G4733 Obstructive sleep apnea (adult) (pediatric): Secondary | ICD-10-CM | POA: Diagnosis present

## 2018-05-30 DIAGNOSIS — G894 Chronic pain syndrome: Secondary | ICD-10-CM | POA: Diagnosis present

## 2018-05-30 DIAGNOSIS — Z9119 Patient's noncompliance with other medical treatment and regimen: Secondary | ICD-10-CM | POA: Diagnosis not present

## 2018-05-30 DIAGNOSIS — Z96652 Presence of left artificial knee joint: Secondary | ICD-10-CM | POA: Diagnosis present

## 2018-05-30 DIAGNOSIS — I509 Heart failure, unspecified: Secondary | ICD-10-CM | POA: Diagnosis present

## 2018-05-30 DIAGNOSIS — G253 Myoclonus: Secondary | ICD-10-CM | POA: Diagnosis present

## 2018-05-30 DIAGNOSIS — F1123 Opioid dependence with withdrawal: Secondary | ICD-10-CM | POA: Diagnosis present

## 2018-05-30 DIAGNOSIS — R296 Repeated falls: Secondary | ICD-10-CM | POA: Diagnosis present

## 2018-05-30 DIAGNOSIS — E291 Testicular hypofunction: Secondary | ICD-10-CM | POA: Diagnosis present

## 2018-05-30 LAB — CBC
HCT: 45.6 % (ref 39.0–52.0)
Hemoglobin: 13.7 g/dL (ref 13.0–17.0)
MCH: 28.2 pg (ref 26.0–34.0)
MCHC: 30 g/dL (ref 30.0–36.0)
MCV: 93.8 fL (ref 80.0–100.0)
NRBC: 0 % (ref 0.0–0.2)
Platelets: 246 10*3/uL (ref 150–400)
RBC: 4.86 MIL/uL (ref 4.22–5.81)
RDW: 13.8 % (ref 11.5–15.5)
WBC: 6.9 10*3/uL (ref 4.0–10.5)

## 2018-05-30 LAB — CBC WITH DIFFERENTIAL/PLATELET
Abs Immature Granulocytes: 0.03 10*3/uL (ref 0.00–0.07)
Basophils Absolute: 0 10*3/uL (ref 0.0–0.1)
Basophils Relative: 0 %
Eosinophils Absolute: 0.1 10*3/uL (ref 0.0–0.5)
Eosinophils Relative: 2 %
HCT: 42.1 % (ref 39.0–52.0)
Hemoglobin: 12.5 g/dL — ABNORMAL LOW (ref 13.0–17.0)
IMMATURE GRANULOCYTES: 1 %
Lymphocytes Relative: 26 %
Lymphs Abs: 1.7 10*3/uL (ref 0.7–4.0)
MCH: 28.2 pg (ref 26.0–34.0)
MCHC: 29.7 g/dL — ABNORMAL LOW (ref 30.0–36.0)
MCV: 94.8 fL (ref 80.0–100.0)
MONOS PCT: 7 %
Monocytes Absolute: 0.4 10*3/uL (ref 0.1–1.0)
Neutro Abs: 4.1 10*3/uL (ref 1.7–7.7)
Neutrophils Relative %: 64 %
Platelets: 232 10*3/uL (ref 150–400)
RBC: 4.44 MIL/uL (ref 4.22–5.81)
RDW: 13.7 % (ref 11.5–15.5)
WBC: 6.4 10*3/uL (ref 4.0–10.5)
nRBC: 0 % (ref 0.0–0.2)

## 2018-05-30 LAB — BASIC METABOLIC PANEL
Anion gap: 12 (ref 5–15)
BUN: 6 mg/dL (ref 6–20)
CHLORIDE: 97 mmol/L — AB (ref 98–111)
CO2: 30 mmol/L (ref 22–32)
CREATININE: 0.89 mg/dL (ref 0.61–1.24)
Calcium: 8.4 mg/dL — ABNORMAL LOW (ref 8.9–10.3)
GFR calc Af Amer: >60 mL/min (ref >60)
GFR calc non Af Amer: >60 mL/min (ref >60)
Glucose, Bld: 104 mg/dL — ABNORMAL HIGH (ref 70–99)
Potassium: 3.8 mmol/L (ref 3.5–5.1)
Sodium: 139 mmol/L (ref 135–145)

## 2018-05-30 LAB — ECHOCARDIOGRAM COMPLETE
Height: 70.5 in
Weight: 6864 oz

## 2018-05-30 LAB — HIV ANTIBODY (ROUTINE TESTING W REFLEX): HIV Screen 4th Generation wRfx: NONREACTIVE

## 2018-05-30 LAB — TROPONIN I
Troponin I: <0.03 ng/mL (ref <0.03)
Troponin I: <0.03 ng/mL (ref <0.03)
Troponin I: <0.03 ng/mL (ref <0.03)

## 2018-05-30 LAB — CREATININE, SERUM
Creatinine, Ser: 0.95 mg/dL (ref 0.61–1.24)
GFR calc Af Amer: >60 mL/min (ref >60)
GFR calc non Af Amer: >60 mL/min (ref >60)

## 2018-05-30 MED ORDER — PROMETHAZINE HCL 25 MG PO TABS
25.0000 mg | ORAL_TABLET | Freq: Three times a day (TID) | ORAL | Status: DC
  Administered 2018-05-30 – 2018-06-03 (×13): 25 mg via ORAL
  Filled 2018-05-30 (×13): qty 1

## 2018-05-30 MED ORDER — GABAPENTIN ENACARBIL ER 600 MG PO TBCR: 600.0000 mg | EXTENDED_RELEASE_TABLET | Freq: Two times a day (BID) | ORAL | Status: DC

## 2018-05-30 MED ORDER — BACLOFEN 5 MG HALF TABLET
10.0000 mg | ORAL_TABLET | Freq: Four times a day (QID) | ORAL | Status: DC
  Administered 2018-05-30 – 2018-06-03 (×18): 10 mg via ORAL
  Filled 2018-05-30 (×18): qty 2

## 2018-05-30 MED ORDER — LOPERAMIDE HCL 2 MG PO CAPS
2.0000 mg | ORAL_CAPSULE | ORAL | Status: DC | PRN
  Administered 2018-05-30 – 2018-06-01 (×3): 2 mg via ORAL
  Filled 2018-05-30 (×4): qty 1

## 2018-05-30 MED ORDER — OXYCODONE HCL 10 MG PO TABS: 10.0000 mg | ORAL_TABLET | Freq: Four times a day (QID) | ORAL | Status: DC | PRN

## 2018-05-30 MED ORDER — ENOXAPARIN SODIUM 100 MG/ML ~~LOC~~ SOLN
90.0000 mg | Freq: Every day | SUBCUTANEOUS | Status: DC
  Administered 2018-05-30 – 2018-06-02 (×4): 90 mg via SUBCUTANEOUS
  Filled 2018-05-30 (×4): qty 1

## 2018-05-30 MED ORDER — MAGNESIUM OXIDE 400 (241.3 MG) MG PO TABS: 400.0000 mg | ORAL_TABLET | Freq: Two times a day (BID) | ORAL | Status: DC

## 2018-05-30 MED ORDER — GABAPENTIN 300 MG PO CAPS: 600.0000 mg | ORAL_CAPSULE | Freq: Two times a day (BID) | ORAL | Status: DC

## 2018-05-30 MED ORDER — PANTOPRAZOLE SODIUM 40 MG PO TBEC
40.0000 mg | DELAYED_RELEASE_TABLET | Freq: Every day | ORAL | Status: DC
  Administered 2018-05-30 – 2018-06-03 (×5): 40 mg via ORAL
  Filled 2018-05-30 (×5): qty 1

## 2018-05-30 MED ORDER — PERFLUTREN LIPID MICROSPHERE
INTRAVENOUS | Status: AC
  Administered 2018-05-30: 3 mL via INTRAVENOUS
  Filled 2018-05-30: qty 10

## 2018-05-30 MED ORDER — ALPRAZOLAM 0.5 MG PO TABS
0.5000 mg | ORAL_TABLET | Freq: Once | ORAL | Status: AC
  Administered 2018-05-30: 0.5 mg via ORAL
  Filled 2018-05-30: qty 1

## 2018-05-30 MED ORDER — OXYCODONE HCL 5 MG PO TABS
10.0000 mg | ORAL_TABLET | Freq: Four times a day (QID) | ORAL | Status: DC | PRN
  Administered 2018-05-30 – 2018-06-03 (×15): 10 mg via ORAL
  Filled 2018-05-30 (×15): qty 2

## 2018-05-30 MED ORDER — GABAPENTIN 300 MG PO CAPS
600.0000 mg | ORAL_CAPSULE | Freq: Two times a day (BID) | ORAL | Status: DC
  Administered 2018-05-30 – 2018-06-03 (×10): 600 mg via ORAL
  Filled 2018-05-30 (×10): qty 2

## 2018-05-30 MED ORDER — HYDROXYZINE HCL 25 MG PO TABS
25.0000 mg | ORAL_TABLET | Freq: Three times a day (TID) | ORAL | Status: DC | PRN
  Administered 2018-05-30 – 2018-05-31 (×4): 25 mg via ORAL
  Filled 2018-05-30 (×4): qty 1

## 2018-05-30 MED ORDER — CAMPHOR-MENTHOL 0.5-0.5 % EX LOTN
TOPICAL_LOTION | CUTANEOUS | Status: DC | PRN
  Administered 2018-05-30: 12:00:00 via TOPICAL
  Filled 2018-05-30: qty 222

## 2018-05-30 MED ORDER — BACLOFEN 5 MG HALF TABLET: 10.0000 mg | ORAL_TABLET | Freq: Four times a day (QID) | ORAL | Status: DC

## 2018-05-30 MED ORDER — HYDRALAZINE HCL 20 MG/ML IJ SOLN: 10.0000 mg | INTRAMUSCULAR | Status: DC | PRN

## 2018-05-30 MED ORDER — CLONIDINE HCL 0.1 MG/24HR TD PTWK: 0.1000 mg | MEDICATED_PATCH | TRANSDERMAL | Status: DC

## 2018-05-30 MED ORDER — MAGNESIUM OXIDE -MG SUPPLEMENT 500 MG PO CAPS: 1.0000 | ORAL_CAPSULE | Freq: Two times a day (BID) | ORAL | Status: DC

## 2018-05-30 NOTE — Progress Notes (Signed)
  Echocardiogram 2D Echocardiogram has been performed.  [name] 05/30/2018, 9:20 AM

## 2018-05-30 NOTE — Progress Notes (Signed)
Patient refused bed alarm. I explained the need for bed alarm for safety and fall prevention. Patient advised to call for help when he needs to get out of bed and the need to remain in bed and not to walk alone the room.

## 2018-05-30 NOTE — Care Management Obs Status (Signed)
MEDICARE OBSERVATION STATUS NOTIFICATION   Patient Details  Name: AKINTUNDE BAYLEY MRN: [id number] Date of Birth: 1965-07-17   Medicare Observation Status Notification Given:  Yes    Lawerance Sabal, RN 05/30/2018, 2:04 PM

## 2018-05-30 NOTE — Progress Notes (Signed)
Pt asks for dilaudid for prn pain breakthrough as he reports oxy is not  Helping paged Dr patel to advise

## 2018-05-30 NOTE — Progress Notes (Signed)
Pt bp softer 99/51, held lisinopril, pt asks why it was ordered when he does not take at home, paged Dr patel to advise, echo sonographer in room to do bedside echocardiogram, pt refused bed alarm even with education, encouraged pt to call for assistance as high fall risk due to falls at home, wife is at bedside

## 2018-05-30 NOTE — Progress Notes (Signed)
RT offered to place pt on CPAP dream station for the night and pt declined. Pt states he is noncompliant with CPAP at home and does not want to wear it here either. RT will continue to monitor.

## 2018-05-30 NOTE — Progress Notes (Signed)
Patient arrived to room 3E25 from [facility] as a direct admit. A&Ox4. ST/SR on telemetry. VSS.  Back pain rated at 8 of 10. No skin breakdown. Patient is a high fall risk due to multiple falls at home. Patient oriented to room and call system.

## 2018-05-30 NOTE — Progress Notes (Signed)
[facility] Progress Note  Patient: [name] BSJ:[id number]   PCP: System, Pcp Not In DOB: [birth year]   DOA: 05/29/2018   DOS: 05/30/2018   Date of Service: the patient was seen and examined on 05/30/2018  Brief hospital course: Pt. with PMH of Asthma, chronic pain syndrome, morbid obesity, OSA; admitted on 05/29/2018, presented with complaint of shortness of breath, was found to have acute on chronic diastolic CHF. Currently further plan is continue IV diuresis.  Subjective: Continues to have shortness of breath.  Also reports severe swelling of the leg as well as generalized weight gain. He reports that he gained 150 pounds in the last few months.  He reports that his baseline weight is 325 pounds.  Current weight is 429 pounds.  He tells me that he is pain management provider is going down on his fentanyl pump and adding baclofen.  He is itching continuously and reports that he has severe anxiety being in the hospital.  He is not wearing CPAP at home because he cannot tolerate the mask and the machine was noisy.  6 bowel movements yesterday 2 so far today as well loose and watery no blood in the stool.  No abdominal pain.  No fever no chills.  Assessment and Plan: 1.  Acute on chronic diastolic CHF. Presents with complaints of shortness of breath and leg swelling. Reportedly his baseline weight is around 325 pounds.  Current weight is 429 pounds. He reports that he has gained this weight since Thanksgiving. Echocardiogram shows preserved EF of 60 to 65%, normal RV pseudonormalization. Has bilateral leg swelling.  JVD is difficult to perceive due to obesity. At present we will continue with IV diuresis. Patient is currently on room air. Monitor renal function while receiving aggressive IV diuresis.  2.  Morbid obesity. Body mass index is 60.69 kg/m.  Obstructive sleep apnea. Noncompliant with CPAP at home. But is willing to try CPAP again here in the hospital. We will resume  CPAP per home regimen.  3.  Recurrent fall. Patient reports that he had 5 falls yesterday in which he will have some jerking motion of 1 of his limbs and he will lose his balance and fall on the ground. No head injury or neck injury reported. No new pain reported right now. CT of the head unremarkable. No focal deficit. Likely secondary to myoclonic jerking.  4.  Myoclonic jerks. Likely combination of polypharmacy, OSA, acute on chronic diastolic CHF. Close monitoring for now.  5.  Chronic pain syndrome. Anxiety. Itching, diarrhea Patient reports that he has chronic back pain and follows up with Dr. [name]. He has an implanted intrathecal fentanyl pump and Dr. [name] is slowly down titrating that. He has added oxycodone as well as baclofen recently. Patient takes Phenergan, and gabapentin Scheduled for many years. Patient was also recently started on clonidine patch which he has been wearing for first time since last 3 days. [place] PDMP was reviewed and fentanyl was verified. His itching and diarrhea as well as anxiety could represent possible withdrawal while he is primary pain provider is going down on his fentanyl. Current presentation could also represent opiate withdrawal induced CHF although difficult to interpret right now. At present given this myoclonic jerking reported of recurrent fall I am uncomfortable to go up on his pain medication as his request. Patient also wants to be on Xanax which I am recommending to avoid and use Atarax instead. Continue monitoring on telemetry. Patient is wearing clonidine patch for  the first time to avoid and minimize side effect of opioid withdrawals but given his hypotension and the need for diuresis will discontinue that medicine.  6.  Diarrhea. Patient does not report any abdominal pain nausea or vomiting no fever no chills. Currently no evidence of or concern for infectious colitis and therefore will provide Imodium.  Diet:  cardiac diet DVT Prophylaxis: subcutaneous Heparin  Advance goals of care discussion: full code  Family Communication: family was present at bedside, at the time of interview. The pt provided permission to discuss medical plan with the family. Opportunity was given to ask question and all questions were answered satisfactorily.   Disposition:  Discharge to home.  Consultants: none Procedures: none  Scheduled Meds: . aspirin EC  81 mg Oral Daily  . baclofen  10 mg Oral QID  . enoxaparin (LOVENOX) injection  90 mg Subcutaneous QHS  . furosemide  40 mg Intravenous BID  . gabapentin  600 mg Oral BID  . Influenza vac split quadrivalent PF  0.5 mL Intramuscular Tomorrow-1000  . pantoprazole  40 mg Oral Daily  . polycarbophil  625 mg Oral TID  . promethazine  25 mg Oral Q8H  . sodium chloride flush  3 mL Intravenous Q12H   Continuous Infusions: . sodium chloride     PRN Meds: sodium chloride, acetaminophen, camphor-menthol, hydrALAZINE, hydrOXYzine, loperamide, ondansetron (ZOFRAN) IV, oxyCODONE, sodium chloride flush Antibiotics: Anti-infectives (From admission, onward)   None       Objective: Physical Exam: Vitals:   05/30/18 0604 05/30/18 0655 05/30/18 0828 05/30/18 1150  BP: (!) 87/45 (!) 95/52 (!) 99/51 115/71  Pulse: (!) 58 61 (!) 59 64  Resp: 18 14 16 17   Temp:  98.4 F (36.9 C) 97.6 F (36.4 C) 97.7 F (36.5 C)  TempSrc:  Oral Oral Oral  SpO2: 94% 99% 100% 100%  Weight:      Height:        Intake/Output Summary (Last 24 hours) at 05/30/2018 1603 Last data filed at 05/30/2018 1500 Gross per 24 hour  Intake 1683 ml  Output 3673 ml  Net -1990 ml   Filed Weights   05/29/18 1715 05/30/18 0237  Weight: (!) 195 kg (!) 194.6 kg   General: Alert, Awake and Oriented to Time, Place and Person. Appear in moderate distress, affect appropriate Eyes: PERRL, Conjunctiva normal ENT: Oral Mucosa clear moist. Neck: difficult to assess  JVD, no Abnormal Mass Or  lumps Cardiovascular: S1 and S2 Present, no Murmur, Peripheral Pulses Present Respiratory: increased respiratory effort, Bilateral Air entry equal and Decreased, no use of accessory muscle basal Crackles, no wheezes Abdomen: Bowel Sound present, Soft and no tenderness, no hernia Skin: no redness, no Rash, no induration Extremities: bilateral  Pedal edema, no calf tenderness Neurologic: Grossly no focal neuro deficit. Bilaterally Equal motor strength Occasional myoclonic jerks.  Data Reviewed: CBC: Recent Labs  Lab 05/29/18 1825 05/30/18 0018 05/30/18 0702  WBC 5.4 6.9 6.4  NEUTROABS 3.8  --  4.1  HGB 13.2 13.7 12.5*  HCT 45.2 45.6 42.1  MCV 95.2 93.8 94.8  PLT 243 246 232   Basic Metabolic Panel: Recent Labs  Lab 05/29/18 1934 05/30/18 0018 05/30/18 0702  NA 135  --  139  K 3.8  --  3.8  CL 100  --  97*  CO2 28  --  30  GLUCOSE 114*  --  104*  BUN 6  --  6  CREATININE 0.80 0.95 0.89  CALCIUM 8.3*  --  8.4*    Liver Function Tests: Recent Labs  Lab 05/29/18 1934  AST 27  ALT 28  ALKPHOS 71  BILITOT 0.7  PROT 7.1  ALBUMIN 3.5   No results for input(s): LIPASE, AMYLASE in the last 168 hours. No results for input(s): AMMONIA in the last 168 hours. Coagulation Profile: No results for input(s): INR, PROTIME in the last 168 hours. Cardiac Enzymes: Recent Labs  Lab 05/29/18 1825 05/30/18 0018 05/30/18 0702 05/30/18 1243  TROPONINI <0.03 <0.03 <0.03 <0.03   BNP (last 3 results) No results for input(s): PROBNP in the last 8760 hours. CBG: No results for input(s): GLUCAP in the last 168 hours. Studies: Dg Chest 2 View  Result Date: 05/29/2018 CLINICAL DATA:  Generalized weakness, shortness of breath, multiple falls in last several months, CHF, GERD EXAM: CHEST - 2 VIEW COMPARISON:  01/04/2015 FINDINGS: Enlargement of cardiac silhouette with pulmonary vascular congestion. Enlarged central pulmonary arteries. Mediastinal contours normal. Slight chronic  accentuation of perihilar markings little changed. No definite acute infiltrate, pleural effusion or pneumothorax. Bones unremarkable. IMPRESSION: Enlargement of cardiac silhouette with pulmonary vascular congestion. Question pulmonary arterial hypertension. No acute abnormalities. Electronically Signed   By: Ulyses Southward M.D.   On: 05/29/2018 18:47   Ct Head Wo Contrast  Result Date: 05/29/2018 CLINICAL DATA:  Multiple recent falls, generalized weakness, visual changes and memory loss. EXAM: CT HEAD WITHOUT CONTRAST TECHNIQUE: Contiguous axial images were obtained from the base of the skull through the vertex without intravenous contrast. COMPARISON:  Head CT dated 12/22/2011. FINDINGS: Brain: Ventricles are normal in size. There is no mass, hemorrhage, edema or other evidence of acute parenchymal abnormality. No extra-axial hemorrhage. Vascular: No hyperdense vessel or unexpected calcification. Skull: Normal. Negative for fracture or focal lesion. Sinuses/Orbits: No acute finding. Other: None. IMPRESSION: Negative head CT. No intracranial mass, hemorrhage or edema. Electronically Signed   By: Bary Richard M.D.   On: 05/29/2018 18:44     Time spent: 35 minutes  Author: Lynden Oxford, MD [facility] 05/30/2018 4:03 PM  To reach On-call, see care teams to locate the attending and reach out to them via [email or web address]. If 7PM-7AM, please contact night-coverage If you still have difficulty reaching the attending provider, please page the [facility] (Director on Call) for [facility] on amion for assistance.

## 2018-05-31 ENCOUNTER — Telehealth: Payer: Self-pay

## 2018-05-31 LAB — COMPREHENSIVE METABOLIC PANEL
ALT: 28 U/L (ref 0–44)
ANION GAP: 12 (ref 5–15)
AST: 27 U/L (ref 15–41)
Albumin: 3.5 g/dL (ref 3.5–5.0)
Alkaline Phosphatase: 63 U/L (ref 38–126)
BILIRUBIN TOTAL: 1.3 mg/dL — AB (ref 0.3–1.2)
BUN: 9 mg/dL (ref 6–20)
CO2: 32 mmol/L (ref 22–32)
Calcium: 8.5 mg/dL — ABNORMAL LOW (ref 8.9–10.3)
Chloride: 96 mmol/L — ABNORMAL LOW (ref 98–111)
Creatinine, Ser: 1.01 mg/dL (ref 0.61–1.24)
GFR calc Af Amer: >60 mL/min (ref >60)
GFR calc non Af Amer: >60 mL/min (ref >60)
Glucose, Bld: 108 mg/dL — ABNORMAL HIGH (ref 70–99)
Potassium: 3.5 mmol/L (ref 3.5–5.1)
Sodium: 140 mmol/L (ref 135–145)
TOTAL PROTEIN: 7.1 g/dL (ref 6.5–8.1)

## 2018-05-31 LAB — CBC WITH DIFFERENTIAL/PLATELET
Abs Immature Granulocytes: 0.02 10*3/uL (ref 0.00–0.07)
Basophils Absolute: 0 10*3/uL (ref 0.0–0.1)
Basophils Relative: 0 %
Eosinophils Absolute: 0.2 10*3/uL (ref 0.0–0.5)
Eosinophils Relative: 4 %
HCT: 46.8 % (ref 39.0–52.0)
Hemoglobin: 13.8 g/dL (ref 13.0–17.0)
Immature Granulocytes: 0 %
Lymphocytes Relative: 29 %
Lymphs Abs: 1.5 10*3/uL (ref 0.7–4.0)
MCH: 27.4 pg (ref 26.0–34.0)
MCHC: 29.5 g/dL — ABNORMAL LOW (ref 30.0–36.0)
MCV: 93 fL (ref 80.0–100.0)
Monocytes Absolute: 0.4 10*3/uL (ref 0.1–1.0)
Monocytes Relative: 8 %
Neutro Abs: 3.1 10*3/uL (ref 1.7–7.7)
Neutrophils Relative %: 59 %
Platelets: 269 10*3/uL (ref 150–400)
RBC: 5.03 MIL/uL (ref 4.22–5.81)
RDW: 13.8 % (ref 11.5–15.5)
WBC: 5.2 10*3/uL (ref 4.0–10.5)
nRBC: 0 % (ref 0.0–0.2)

## 2018-05-31 LAB — MAGNESIUM: Magnesium: 2.4 mg/dL (ref 1.7–2.4)

## 2018-05-31 MED ORDER — POTASSIUM CHLORIDE CRYS ER 20 MEQ PO TBCR
40.0000 meq | EXTENDED_RELEASE_TABLET | Freq: Every day | ORAL | Status: DC
  Administered 2018-05-31 – 2018-06-03 (×4): 40 meq via ORAL
  Filled 2018-05-31 (×4): qty 2

## 2018-05-31 MED ORDER — HYDROXYZINE HCL 25 MG PO TABS
25.0000 mg | ORAL_TABLET | ORAL | Status: DC | PRN
  Administered 2018-05-31 – 2018-06-02 (×7): 25 mg via ORAL
  Filled 2018-05-31 (×7): qty 1

## 2018-05-31 MED ORDER — DICYCLOMINE HCL 20 MG PO TABS: 20.0000 mg | ORAL_TABLET | Freq: Four times a day (QID) | ORAL | Status: DC | PRN

## 2018-05-31 MED ORDER — NAPROXEN 250 MG PO TABS
500.0000 mg | ORAL_TABLET | Freq: Two times a day (BID) | ORAL | Status: DC | PRN
  Administered 2018-05-31 – 2018-06-02 (×5): 500 mg via ORAL
  Filled 2018-05-31 (×7): qty 2

## 2018-05-31 MED ORDER — METHOCARBAMOL 500 MG PO TABS
500.0000 mg | ORAL_TABLET | Freq: Three times a day (TID) | ORAL | Status: DC | PRN
  Administered 2018-06-01 – 2018-06-03 (×5): 500 mg via ORAL
  Filled 2018-05-31 (×5): qty 1

## 2018-05-31 NOTE — Evaluation (Signed)
Physical Therapy Evaluation and Discharge Patient Details Name: [name] MRN: [id number] DOB: [birth year] Today's Date: 05/31/2018   History of Present Illness  Pt is a 53 y/o male admitted secondary to increased SOB likely secondary to CHF exacerbation. Pt also with myoclonic jerking thought to be secondary to polypharmacy and CHF exacerbation. PMH includes asthma, obesity, OSA, and chronic pain.   Clinical Impression  Patient evaluated by Physical Therapy with no further acute PT needs identified. All education has been completed and the patient has no further questions. Pt requiring supervision for mobility using cane. Pt with slower gait and chronic pain in BLE, and reports gait is at baseline. Reports he does not feel like he needs continued PT services. Reports wife can assist at home.  See below for any follow-up Physical Therapy or equipment needs. PT is signing off. Thank you for this referral. If needs change, please re-consult.      Follow Up Recommendations No PT follow up    Equipment Recommendations  None recommended by PT    Recommendations for Other Services       Precautions / Restrictions Precautions Precautions: None Restrictions Weight Bearing Restrictions: No      Mobility  Bed Mobility               General bed mobility comments: Sitting EOB upon entry.   Transfers Overall transfer level: Needs assistance Equipment used: Straight cane Transfers: Sit to/from Stand Sit to Stand: Supervision         General transfer comment: Supervision and increased time required to stand secondary to LE pain. Reports this is baseline for pt.   Ambulation/Gait Ambulation/Gait assistance: Supervision Gait Distance (Feet): 150 Feet Assistive device: Straight cane Gait Pattern/deviations: Step-through pattern;Decreased stride length Gait velocity: Decreased    General Gait Details: Slow, waddle type gait, however, no overt LOB noted. Pt reports gait is at  baseline.   Stairs Stairs: Yes       General stair comments: Pt refusing to practice stair training, however, educated about use of step to pattern to increase safety when performing stair management at home. Pt voiced understanding.   Wheelchair Mobility    Modified Rankin (Stroke Patients Only)       Balance Overall balance assessment: Needs assistance Sitting-balance support: Feet supported;No upper extremity supported Sitting balance-Leahy Scale: Good     Standing balance support: Single extremity supported;During functional activity Standing balance-Leahy Scale: Poor Standing balance comment: Reliant on UE support                              Pertinent Vitals/Pain Pain Assessment: Faces Faces Pain Scale: Hurts even more Pain Location: chronic pain in BLE  Pain Descriptors / Indicators: Aching;Grimacing;Guarding Pain Intervention(s): Monitored during session;Limited activity within patient's tolerance;Repositioned    Home Living Family/patient expects to be discharged to:: Private residence Living Arrangements: Spouse/significant other Available Help at Discharge: Family;Available 24 hours/day Type of Home: House Home Access: Stairs to enter Entrance Stairs-Rails: Right Entrance Stairs-Number of Steps: 4 Home Layout: One level Home Equipment: Walker - 4 wheels;Cane - single point      Prior Function Level of Independence: Independent with assistive device(s)         Comments: Uses cane vs rollator for ambulation depending on the day.      Hand Dominance        Extremity/Trunk Assessment   Upper Extremity Assessment Upper Extremity Assessment: Overall WFL for  tasks assessed    Lower Extremity Assessment Lower Extremity Assessment: Generalized weakness(chronic pain in LEs at baseline )       Communication   Communication: No difficulties  Cognition Arousal/Alertness: Awake/alert Behavior During Therapy: WFL for tasks  assessed/performed Overall Cognitive Status: Within Functional Limits for tasks assessed                                        General Comments General comments (skin integrity, edema, etc.): Pt's wife present during session. Pt reports he is at baseline and does not feel like he needs further acute PT.     Exercises     Assessment/Plan    PT Assessment Patent does not need any further PT services  PT Problem List         PT Treatment Interventions      PT Goals (Current goals can be found in the Care Plan section)  Acute Rehab PT Goals Patient Stated Goal: "to get out of here as soon as I can" PT Goal Formulation: With patient Time For Goal Achievement: 05/31/18 Potential to Achieve Goals: Good    Frequency     Barriers to discharge        Co-evaluation               AM-PAC PT "6 Clicks" Mobility  Outcome Measure Help needed turning from your back to your side while in a flat bed without using bedrails?: None Help needed moving from lying on your back to sitting on the side of a flat bed without using bedrails?: None Help needed moving to and from a bed to a chair (including a wheelchair)?: None Help needed standing up from a chair using your arms (e.g., wheelchair or bedside chair)?: None Help needed to walk in hospital room?: None Help needed climbing 3-5 steps with a railing? : A Little 6 Click Score: 23    End of Session   Activity Tolerance: Patient tolerated treatment well Patient left: in bed;with call bell/phone within reach;with family/visitor present(sitting EOB ) Nurse Communication: Mobility status PT Visit Diagnosis: Other abnormalities of gait and mobility (R26.89);Pain Pain - Right/Left: (bilateral) Pain - part of body: Leg    Time: 1214-1224 PT Time Calculation (min) (ACUTE ONLY): 10 min   Charges:   PT Evaluation $PT Eval Low Complexity: 1 Low          Gladys Damme, PT, DPT  Acute Rehabilitation  Services  Pager: [phone] Office: [phone]   Lehman Prom 05/31/2018, 1:05 PM

## 2018-05-31 NOTE — Care Management Note (Signed)
Case Management Note  Patient Details  Name: JONAVON HORSMAN MRN: [id number] Date of Birth: Dec 28, 1965  Subjective/Objective:     CHF              Action/Plan: Patient lives at home with spouse; PCP: Johny Blamer, MD; has private insurance with [facility] with prescription drug coverage; CM following for progression of care.  Expected Discharge Date:    possibly 06/05/2018              Expected Discharge Plan:  Home/Self Care  In-House Referral:   [facility]  Discharge planning Services  CM Consult   Status of Service:  In process, will continue to follow  Reola Mosher [phone] 05/31/2018, 3:23 PM

## 2018-05-31 NOTE — Telephone Encounter (Signed)
Post pump refill follow up call. Patient states that he is in the hospital with CHF and is on Lasix and is still having weakness and pain.  Continues to have the withdrawal feeling.  Patient states that his blood pressure is low.  Instructed to call us when he is discharged from hospital.  Will notify Dr [name].

## 2018-05-31 NOTE — Progress Notes (Deleted)
PT [name] paged to place note for o2 requirements, also reports pt refuses home health 

## 2018-05-31 NOTE — Progress Notes (Signed)
[facility] Progress Note  Patient: [name] ESL:[id number]   PCP: [name], MD DOB: 01/17/1966   DOA: 05/29/2018   DOS: 05/31/2018   Date of Service: the patient was seen and examined on 05/31/2018  Brief hospital course: Pt. with PMH of Asthma, chronic pain syndrome, morbid obesity, OSA; admitted on 05/29/2018, presented with complaint of shortness of breath, was found to have acute on chronic diastolic CHF. Currently further plan is continue IV diuresis.  Subjective: Continues to have shortness of breath. Chronic back pain still present, no jerking movements.   Assessment and Plan: 1.  Acute on chronic diastolic CHF. Presents with complaints of shortness of breath and leg swelling. Reportedly his baseline weight is around 325 pounds.  Current weight is 429 pounds. He reports that he has gained this weight since Thanksgiving. Echocardiogram shows preserved EF of 60 to 65%, normal RV pseudonormalization. Has bilateral leg swelling.  JVD is difficult to perceive due to obesity. At present we will continue with IV diuresis. Patient is currently on room air. Monitor renal function while receiving aggressive IV diuresis.  2.  Morbid obesity. Body mass index is 59.54 kg/m.  Obstructive sleep apnea. Noncompliant with CPAP at home. But is willing to try CPAP again here in the hospital. We will resume CPAP per home regimen.  3.  Recurrent fall. Patient reports that he had 5 falls yesterday in which he will have some jerking motion of 1 of his limbs and he will lose his balance and fall on the ground. No head injury or neck injury reported. No new pain reported right now. CT of the head unremarkable. No focal deficit. Likely secondary to myoclonic jerking.  4.  Myoclonic jerks. Likely combination of polypharmacy, OSA, acute on chronic diastolic CHF. Close monitoring for now.  5.  Chronic pain syndrome. Anxiety. Itching, diarrhea Patient reports that he has chronic  back pain and follows up with Dr. [name]. He has an implanted intrathecal fentanyl pump and Dr. [name] is slowly down titrating that. He has added oxycodone as well as baclofen recently. Patient takes Phenergan, and gabapentin Scheduled for many years. Patient was also recently started on clonidine patch which he has been wearing for first time since last 3 days. [place] PDMP was reviewed and fentanyl was verified. His itching and diarrhea as well as anxiety could represent possible withdrawal while he is primary pain provider is going down on his fentanyl. Current presentation could also represent opiate withdrawal induced CHF although difficult to interpret right now. At present given this myoclonic jerking reported of recurrent fall I am uncomfortable to go up on his pain medication as his request. Patient also wants to be on Xanax which I am recommending to avoid and use Atarax instead. Continue monitoring on telemetry. Patient is wearing clonidine patch for the first time to avoid and minimize side effect of opioid withdrawals but given his hypotension and the need for diuresis will discontinue that medicine.  6.  Diarrhea. Patient does not report any abdominal pain nausea or vomiting no fever no chills. Currently no evidence of or concern for infectious colitis and therefore will provide Imodium.  Diet: cardiac diet DVT Prophylaxis: subcutaneous Heparin  Advance goals of care discussion: full code  Family Communication: family was present at bedside, at the time of interview. The pt provided permission to discuss medical plan with the family. Opportunity was given to ask question and all questions were answered satisfactorily.   Disposition:  Discharge to home.  Consultants: none Procedures: none  Scheduled Meds: . aspirin EC  81 mg Oral Daily  . baclofen  10 mg Oral QID  . enoxaparin (LOVENOX) injection  90 mg Subcutaneous QHS  . furosemide  40 mg Intravenous BID  .  gabapentin  600 mg Oral BID  . Influenza vac split quadrivalent PF  0.5 mL Intramuscular Tomorrow-1000  . pantoprazole  40 mg Oral Daily  . potassium chloride  40 mEq Oral Daily  . promethazine  25 mg Oral Q8H  . sodium chloride flush  3 mL Intravenous Q12H   Continuous Infusions: . sodium chloride     PRN Meds: sodium chloride, acetaminophen, camphor-menthol, dicyclomine, hydrALAZINE, hydrOXYzine, loperamide, methocarbamol, naproxen, ondansetron (ZOFRAN) IV, oxyCODONE, sodium chloride flush Antibiotics: Anti-infectives (From admission, onward)   None       Objective: Physical Exam: Vitals:   05/31/18 0541 05/31/18 0747 05/31/18 1120 05/31/18 2001  BP: (!) 108/59 131/68 121/69 121/70  Pulse: 78 69 82 77  Resp: 20 17 19 18   Temp: 98.1 F (36.7 C) 98.2 F (36.8 C) 98.3 F (36.8 C) 98.3 F (36.8 C)  TempSrc: Oral Oral Oral Oral  SpO2: 94% 94% 96% 100%  Weight:      Height:        Intake/Output Summary (Last 24 hours) at 05/31/2018 2054 Last data filed at 05/31/2018 1730 Gross per 24 hour  Intake 1443 ml  Output 3825 ml  Net -2382 ml   Filed Weights   05/29/18 1715 05/30/18 0237 05/31/18 0537  Weight: (!) 195 kg (!) 194.6 kg (!) 190.9 kg   General: Alert, Awake and Oriented to Time, Place and Person. Appear in moderate distress, affect appropriate Eyes: PERRL, Conjunctiva normal ENT: Oral Mucosa clear moist. Neck: difficult to assess  JVD, no Abnormal Mass Or lumps Cardiovascular: S1 and S2 Present, no Murmur, Peripheral Pulses Present Respiratory: increased respiratory effort, Bilateral Air entry equal and Decreased, no use of accessory muscle basal Crackles, no wheezes Abdomen: Bowel Sound present, Soft and no tenderness, no hernia Skin: no redness, no Rash, no induration Extremities: bilateral  Pedal edema, no calf tenderness Neurologic: Grossly no focal neuro deficit. Bilaterally Equal motor strength Occasional myoclonic jerks.  Data Reviewed: CBC: Recent  Labs  Lab 05/29/18 1825 05/30/18 0018 05/30/18 0702 05/31/18 0554  WBC 5.4 6.9 6.4 5.2  NEUTROABS 3.8  --  4.1 3.1  HGB 13.2 13.7 12.5* 13.8  HCT 45.2 45.6 42.1 46.8  MCV 95.2 93.8 94.8 93.0  PLT 243 246 232 269   Basic Metabolic Panel: Recent Labs  Lab 05/29/18 1934 05/30/18 0018 05/30/18 0702 05/31/18 0554  NA 135  --  139 140  K 3.8  --  3.8 3.5  CL 100  --  97* 96*  CO2 28  --  30 32  GLUCOSE 114*  --  104* 108*  BUN 6  --  6 9  CREATININE 0.80 0.95 0.89 1.01  CALCIUM 8.3*  --  8.4* 8.5*  MG  --   --   --  2.4    Liver Function Tests: Recent Labs  Lab 05/29/18 1934 05/31/18 0554  AST 27 27  ALT 28 28  ALKPHOS 71 63  BILITOT 0.7 1.3*  PROT 7.1 7.1  ALBUMIN 3.5 3.5   No results for input(s): LIPASE, AMYLASE in the last 168 hours. No results for input(s): AMMONIA in the last 168 hours. Coagulation Profile: No results for input(s): INR, PROTIME in the last 168 hours. Cardiac Enzymes: Recent  Labs  Lab 05/29/18 1825 05/30/18 0018 05/30/18 0702 05/30/18 1243  TROPONINI <0.03 <0.03 <0.03 <0.03   BNP (last 3 results) No results for input(s): PROBNP in the last 8760 hours. CBG: No results for input(s): GLUCAP in the last 168 hours. Studies: No results found.   Time spent: 35 minutes  Author: Lynden OxfordPranav Patina Spanier, MD [facility] 05/31/2018 8:54 PM  To reach On-call, see care teams to locate the attending and reach out to them via [email or web address]. If 7PM-7AM, please contact night-coverage If you still have difficulty reaching the attending provider, please page the [facility] (Director on Call) for [facility] on amion for assistance.

## 2018-06-01 ENCOUNTER — Encounter (HOSPITAL_COMMUNITY): Payer: Self-pay

## 2018-06-01 ENCOUNTER — Ambulatory Visit: Payer: Self-pay | Admitting: Pain Medicine

## 2018-06-01 LAB — BASIC METABOLIC PANEL
Anion gap: 14 (ref 5–15)
BUN: 10 mg/dL (ref 6–20)
CO2: 31 mmol/L (ref 22–32)
Calcium: 8.8 mg/dL — ABNORMAL LOW (ref 8.9–10.3)
Chloride: 93 mmol/L — ABNORMAL LOW (ref 98–111)
Creatinine, Ser: 1.01 mg/dL (ref 0.61–1.24)
GFR calc non Af Amer: >60 mL/min (ref >60)
Glucose, Bld: 113 mg/dL — ABNORMAL HIGH (ref 70–99)
Potassium: 3.7 mmol/L (ref 3.5–5.1)
SODIUM: 138 mmol/L (ref 135–145)

## 2018-06-01 MED ORDER — FUROSEMIDE 10 MG/ML IJ SOLN
40.0000 mg | Freq: Once | INTRAMUSCULAR | Status: AC
  Administered 2018-06-01: 40 mg via INTRAVENOUS
  Filled 2018-06-01: qty 4

## 2018-06-01 MED ORDER — DIPHENOXYLATE-ATROPINE 2.5-0.025 MG PO TABS: 1.0000 | ORAL_TABLET | Freq: Four times a day (QID) | ORAL | Status: DC | PRN

## 2018-06-01 MED ORDER — FUROSEMIDE 10 MG/ML IJ SOLN
80.0000 mg | Freq: Two times a day (BID) | INTRAMUSCULAR | Status: DC
  Administered 2018-06-01 – 2018-06-02 (×2): 80 mg via INTRAVENOUS
  Filled 2018-06-01 (×3): qty 8

## 2018-06-01 MED FILL — Medication: INTRATHECAL | Qty: 1 | Status: AC

## 2018-06-01 NOTE — Progress Notes (Signed)
The patient came into the clinic today to have his intrathecal pump adjusted up.  We have increased that and we will continue to follow-up with him until we can get it to equivalent amount of what he was taking before.  Unfortunately, at this time it is difficult to determine what is going on since he indicates having lost (the bottle of his pain medication filled up with water) his oral opioid analgesics.  This by itself could be the cause of the withdrawals that he has been experiencing.  Of course, he is also a little difficult to read since he tends to give me information and sound bites and he also tends to keep things from me.  In this case the example is when he lost his oral opioids.  He did not inform me of this it was actually his wife that did.  Today I have ordered the pump to be increased by 20% to deliver PF-Fentanyl 959.6 mcg/day.  I have reviewed and signed the pump printout after the infusion rate was adjusted.

## 2018-06-01 NOTE — Progress Notes (Signed)
[facility] Progress Note  Patient: [name] LGX:[id number]   PCP: [name], MD DOB: 1965-06-23   DOA: 05/29/2018   DOS: 06/01/2018   Date of Service: the patient was seen and examined on 06/01/2018  Brief hospital course: Pt. with PMH of Asthma, chronic pain syndrome, morbid obesity, OSA; admitted on 05/29/2018, presented with complaint of shortness of breath, was found to have acute on chronic diastolic CHF. Currently further plan is continue IV diuresis.  Subjective: No nausea no vomiting no fever no chills no chest pain no abdominal pain.  Continues to have shortness of breath as well as swelling of the leg.  Reports right leg is swollen more than the left leg.  Assessment and Plan: 1.  Acute on chronic diastolic CHF. Presents with complaints of shortness of breath and leg swelling. Reportedly his baseline weight is around 325 pounds.  Current weight is 429 pounds. He reports that he has gained this weight since Thanksgiving. Echocardiogram shows preserved EF of 60 to 65%, normal RV pseudonormalization. Has bilateral leg swelling.  JVD is difficult to perceive due to obesity. At present we will continue with IV diuresis.  Dose increased from IV 40 mg Lasix twice daily to IV 80 mg Lasix twice daily. Patient is currently on room air. Monitor renal function while receiving aggressive IV diuresis.  2.  Morbid obesity. Body mass index is 58.75 kg/m.  Obstructive sleep apnea. Noncompliant with CPAP at home. But is willing to try CPAP again here in the hospital. We will resume CPAP per home regimen. While the patient has remarkable weight gain reportedly per chart review over last 3 months I suspect that all this weight is less likely to be a simple water weight.  3.  Recurrent fall. Patient reports that he had 5 falls yesterday in which he will have some jerking motion of 1 of his limbs and he will lose his balance and fall on the ground. No head injury or neck injury  reported. No new pain reported right now. CT of the head unremarkable. No focal deficit. Likely secondary to myoclonic jerking.  4.  Myoclonic jerks. Likely combination of polypharmacy, OSA, acute on chronic diastolic CHF. Close monitoring for now.  5.  Chronic pain syndrome. Anxiety. Itching, diarrhea Patient reports that he has chronic back pain and follows up with Dr. [name]. He has an implanted intrathecal fentanyl pump and Dr. [name] is slowly down titrating that. He has added oxycodone as well as baclofen recently. Patient takes Phenergan, and gabapentin Scheduled for many years. Patient was also recently started on clonidine patch which he has been wearing for first time since last 3 days. [place] PDMP was reviewed and fentanyl was verified. His itching and diarrhea as well as anxiety could represent possible withdrawal while he is primary pain provider is going down on his fentanyl. Current presentation could also represent opiate withdrawal induced CHF although difficult to interpret right now. At present given this myoclonic jerking reported of recurrent fall I am uncomfortable to go up on his pain medication as his request. Patient also wants to be on Xanax which I am recommending to avoid and use Atarax instead. Continue monitoring on telemetry. Patient is wearing clonidine patch for the first time to avoid and minimize side effect of opioid withdrawals but given his hypotension and the need for diuresis will discontinue that medicine. Discussed with patient's primary pain care provider and recommend continue current management.  6.  Diarrhea. Patient does not report any  abdominal pain nausea or vomiting no fever no chills. Currently no evidence of or concern for infectious colitis and therefore will provide Imodium.  Diet: cardiac diet DVT Prophylaxis: subcutaneous Heparin  Advance goals of care discussion: full code  Family Communication: family was present at  bedside, at the time of interview. The pt provided permission to discuss medical plan with the family. Opportunity was given to ask question and all questions were answered satisfactorily.   Disposition:  Discharge to home.  Consultants: none Procedures: none  Scheduled Meds: . aspirin EC  81 mg Oral Daily  . baclofen  10 mg Oral QID  . enoxaparin (LOVENOX) injection  90 mg Subcutaneous QHS  . furosemide  80 mg Intravenous BID  . gabapentin  600 mg Oral BID  . Influenza vac split quadrivalent PF  0.5 mL Intramuscular Tomorrow-1000  . pantoprazole  40 mg Oral Daily  . potassium chloride  40 mEq Oral Daily  . promethazine  25 mg Oral Q8H  . sodium chloride flush  3 mL Intravenous Q12H   Continuous Infusions: . sodium chloride     PRN Meds: sodium chloride, acetaminophen, camphor-menthol, dicyclomine, diphenoxylate-atropine, hydrALAZINE, hydrOXYzine, loperamide, methocarbamol, naproxen, ondansetron (ZOFRAN) IV, oxyCODONE, sodium chloride flush Antibiotics: Anti-infectives (From admission, onward)   None       Objective: Physical Exam: Vitals:   05/31/18 2001 06/01/18 0029 06/01/18 0419 06/01/18 1651  BP: 121/70  102/69 (!) 91/58  Pulse: 77 74 63 83  Resp: 18 18 20 19   Temp: 98.3 F (36.8 C)  97.6 F (36.4 C) 98.4 F (36.9 C)  TempSrc: Oral   Oral  SpO2: 100% 98% 96% 98%  Weight:   (!) 188.4 kg   Height:        Intake/Output Summary (Last 24 hours) at 06/01/2018 1752 Last data filed at 06/01/2018 1600 Gross per 24 hour  Intake 1206 ml  Output 3800 ml  Net -2594 ml   Filed Weights   05/30/18 0237 05/31/18 0537 06/01/18 0419  Weight: (!) 194.6 kg (!) 190.9 kg (!) 188.4 kg   General: Alert, Awake and Oriented to Time, Place and Person. Appear in moderate distress, affect appropriate Eyes: PERRL, Conjunctiva normal ENT: Oral Mucosa clear moist. Neck: difficult to assess  JVD, no Abnormal Mass Or lumps Cardiovascular: S1 and S2 Present, no Murmur, Peripheral Pulses  Present Respiratory: increased respiratory effort, Bilateral Air entry equal and Decreased, no use of accessory muscle basal Crackles, no wheezes Abdomen: Bowel Sound present, Soft and no tenderness, no hernia Skin: no redness, no Rash, no induration Extremities: bilateral  Pedal edema, no calf tenderness Neurologic: Grossly no focal neuro deficit. Bilaterally Equal motor strength Occasional myoclonic jerks.  Data Reviewed: CBC: Recent Labs  Lab 05/29/18 1825 05/30/18 0018 05/30/18 0702 05/31/18 0554  WBC 5.4 6.9 6.4 5.2  NEUTROABS 3.8  --  4.1 3.1  HGB 13.2 13.7 12.5* 13.8  HCT 45.2 45.6 42.1 46.8  MCV 95.2 93.8 94.8 93.0  PLT 243 246 232 269   Basic Metabolic Panel: Recent Labs  Lab 05/29/18 1934 05/30/18 0018 05/30/18 0702 05/31/18 0554 06/01/18 0454  NA 135  --  139 140 138  K 3.8  --  3.8 3.5 3.7  CL 100  --  97* 96* 93*  CO2 28  --  30 32 31  GLUCOSE 114*  --  104* 108* 113*  BUN 6  --  6 9 10   CREATININE 0.80 0.95 0.89 1.01 1.01  CALCIUM 8.3*  --  8.4* 8.5* 8.8*  MG  --   --   --  2.4  --     Liver Function Tests: Recent Labs  Lab 05/29/18 1934 05/31/18 0554  AST 27 27  ALT 28 28  ALKPHOS 71 63  BILITOT 0.7 1.3*  PROT 7.1 7.1  ALBUMIN 3.5 3.5   No results for input(s): LIPASE, AMYLASE in the last 168 hours. No results for input(s): AMMONIA in the last 168 hours. Coagulation Profile: No results for input(s): INR, PROTIME in the last 168 hours. Cardiac Enzymes: Recent Labs  Lab 05/29/18 1825 05/30/18 0018 05/30/18 0702 05/30/18 1243  TROPONINI <0.03 <0.03 <0.03 <0.03   BNP (last 3 results) No results for input(s): PROBNP in the last 8760 hours. CBG: No results for input(s): GLUCAP in the last 168 hours. Studies: No results found.   Time spent: 35 minutes  Author: Lynden OxfordPranav Judah Carchi, MD [facility] 06/01/2018 5:52 PM  To reach On-call, see care teams to locate the attending and reach out to them via [email or web address]. If 7PM-7AM, please  contact night-coverage If you still have difficulty reaching the attending provider, please page the [facility] (Director on Call) for [facility] on amion for assistance.

## 2018-06-01 NOTE — Progress Notes (Signed)
RT in to place patient on CPAP, however, patient stated he would place himself on when ready. RT filled sterile water to appropriate level and checked settings. Auto titrate max 20, min 6. Informed patient to call RN for RT if needed anything.

## 2018-06-02 ENCOUNTER — Inpatient Hospital Stay (HOSPITAL_COMMUNITY): Payer: No Typology Code available for payment source

## 2018-06-02 DIAGNOSIS — R609 Edema, unspecified: Secondary | ICD-10-CM

## 2018-06-02 LAB — BASIC METABOLIC PANEL
Anion gap: 11 (ref 5–15)
BUN: 11 mg/dL (ref 6–20)
CO2: 32 mmol/L (ref 22–32)
Calcium: 8.6 mg/dL — ABNORMAL LOW (ref 8.9–10.3)
Chloride: 96 mmol/L — ABNORMAL LOW (ref 98–111)
Creatinine, Ser: 1.05 mg/dL (ref 0.61–1.24)
GFR calc Af Amer: >60 mL/min (ref >60)
GFR calc non Af Amer: >60 mL/min (ref >60)
Glucose, Bld: 112 mg/dL — ABNORMAL HIGH (ref 70–99)
Potassium: 4.2 mmol/L (ref 3.5–5.1)
SODIUM: 139 mmol/L (ref 135–145)

## 2018-06-02 MED ORDER — FUROSEMIDE 10 MG/ML IJ SOLN
120.0000 mg | Freq: Three times a day (TID) | INTRAVENOUS | Status: DC
  Administered 2018-06-02 – 2018-06-03 (×3): 120 mg via INTRAVENOUS
  Filled 2018-06-02 (×2): qty 10
  Filled 2018-06-02: qty 12
  Filled 2018-06-02: qty 10

## 2018-06-02 NOTE — Progress Notes (Signed)
PROGRESS NOTE    [name]  ERD:[id number] DOB: May 28, 1965 DOA: 05/29/2018 PCP: [name], MD  Brief Narrative: ( 53 year old with past medical history relevant for chronic diastolic heart failure, class III obesity, obstructive sleep apnea noncompliant with CPAP, chronic pain who presented with shortness of breath and found to have possible acute on chronic diastolic heart failure exacerbation.   Assessment & Plan:   Principal Problem:   Acute CHF (congestive heart failure) (HCC) Active Problems:   OSA complicated by mild polycythemia    Chronic pain syndrome   Gastroesophageal reflux disease without esophagitis   Morbid obesity with BMI of 50.0-59.9, adult (HCC)   #) Acute on chronic diastolic heart failure exacerbation: Patient continues to have evidence of significant volume overload.  It is unclear how much of his orthopnea is new versus chronic. -Increase furosemide 200 mg 3 times daily -Strict ins and outs, weigh daily, sodium restricted diet -Telemetry - Doppler ultrasound of lower extremities pending due to edema that is asymmetric  #) Obstructive sleep apnea noncompliant with CPAP: Patient again did not wear CPAP at night.  Query whether significant amount of patient's heart failure exacerbation could be secondary to group C pulmonary arterial hypertension. -Continue to encourage CPAP  #) Chronic pain/Withdrawal: Patient reports that he is slowly being taken off of his chronic opiates that he is been on for some time.  He currently has an implanted intrathecal opioid pump. -Continue pump - Continue baclofen  Fluids: Restrict Electrolytes: Monitor and supplement Nutrition: Sodium restricted diet  Prophylaxis: Enoxaparin  Disposition: Pending improved edema  Full code    Consultants:   None  Procedures:  Echo 05/30/2018: 1. The left ventricle has normal systolic function with an ejection fraction of 60-65%. The cavity size was normal. Left  ventricular diastolic Doppler parameters are consistent with pseudonormalization Elevated left ventricular end-diastolic pressure.  2. The right ventricle has normal systolic function. The cavity was normal. There is no increase in right ventricular wall thickness.  3. Left atrial size was mildly dilated.  4. The mitral valve is normal in structure. There is moderate to severe mitral annular calcification present.  5. The tricuspid valve is normal in structure.  6. The aortic valve is tricuspid.  7. The pulmonic valve was normal in structure.   8. Right atrial pressure is estimated at 3 mmHg.  Antimicrobials:   None   Subjective: This morning patient only reports his chronic pain.  He denies any nausea, vomiting, diarrhea.  He reports continued orthopnea though this is unclear if this is related to his body habitus and is not a new finding.  Patient is also noted to have right greater than left lower extremity edema which she reports is relatively stable since admission.  Objective: Vitals:   06/01/18 0419 06/01/18 1651 06/01/18 2014 06/02/18 0533  BP: 102/69 (!) 91/58 97/66 112/78  Pulse: 63 83 78 87  Resp: 20 19 18 18   Temp: 97.6 F (36.4 C) 98.4 F (36.9 C) 98.1 F (36.7 C) 99.2 F (37.3 C)  TempSrc:  Oral Oral Oral  SpO2: 96% 98% 99% 98%  Weight: (!) 188.4 kg   (!) 185.6 kg  Height:        Intake/Output Summary (Last 24 hours) at 06/02/2018 1124 Last data filed at 06/02/2018 1100 Gross per 24 hour  Intake 1520 ml  Output 3590 ml  Net -2070 ml   Filed Weights   05/31/18 0537 06/01/18 0419 06/02/18 0533  Weight: (!) 190.9 kg Marland Kitchen)  188.4 kg (!) 185.6 kg    Examination:  General exam: Appears calm and comfortable  Respiratory system: No increased work of breathing, diminished lung sounds at bases, no wheezes, crackles, rhonchi Cardiovascular system: Distant heart sounds, regular rate and rhythm, no murmurs Gastrointestinal system: Soft, nondistended, no rebound or  guarding, plus bowel sounds Central nervous system: Alert and oriented.  Grossly intact, moving all extremities. Extremities: Right greater than left 3+ lower extremity edema Skin: Chronic venous stasis dermatitis, in left quadrant of abdomen implanted morphine pump noted Psychiatry: Judgement and insight appear normal. Mood & affect appropriate.     Data Reviewed: I have personally reviewed following labs and imaging studies  CBC: Recent Labs  Lab 05/29/18 1825 05/30/18 0018 05/30/18 0702 05/31/18 0554  WBC 5.4 6.9 6.4 5.2  NEUTROABS 3.8  --  4.1 3.1  HGB 13.2 13.7 12.5* 13.8  HCT 45.2 45.6 42.1 46.8  MCV 95.2 93.8 94.8 93.0  PLT 243 246 232 269   Basic Metabolic Panel: Recent Labs  Lab 05/29/18 1934 05/30/18 0018 05/30/18 0702 05/31/18 0554 06/01/18 0454 06/02/18 0349  NA 135  --  139 140 138 139  K 3.8  --  3.8 3.5 3.7 4.2  CL 100  --  97* 96* 93* 96*  CO2 28  --  30 32 31 32  GLUCOSE 114*  --  104* 108* 113* 112*  BUN 6  --  6 9 10 11   CREATININE 0.80 0.95 0.89 1.01 1.01 1.05  CALCIUM 8.3*  --  8.4* 8.5* 8.8* 8.6*  MG  --   --   --  2.4  --   --    GFR: Estimated Creatinine Clearance: 139.9 mL/min (by C-G formula based on SCr of 1.05 mg/dL). Liver Function Tests: Recent Labs  Lab 05/29/18 1934 05/31/18 0554  AST 27 27  ALT 28 28  ALKPHOS 71 63  BILITOT 0.7 1.3*  PROT 7.1 7.1  ALBUMIN 3.5 3.5   No results for input(s): LIPASE, AMYLASE in the last 168 hours. No results for input(s): AMMONIA in the last 168 hours. Coagulation Profile: No results for input(s): INR, PROTIME in the last 168 hours. Cardiac Enzymes: Recent Labs  Lab 05/29/18 1825 05/30/18 0018 05/30/18 0702 05/30/18 1243  TROPONINI <0.03 <0.03 <0.03 <0.03   BNP (last 3 results) No results for input(s): PROBNP in the last 8760 hours. HbA1C: No results for input(s): HGBA1C in the last 72 hours. CBG: No results for input(s): GLUCAP in the last 168 hours. Lipid Profile: No results  for input(s): CHOL, HDL, LDLCALC, TRIG, CHOLHDL, LDLDIRECT in the last 72 hours. Thyroid Function Tests: No results for input(s): TSH, T4TOTAL, FREET4, T3FREE, THYROIDAB in the last 72 hours. Anemia Panel: No results for input(s): VITAMINB12, FOLATE, FERRITIN, TIBC, IRON, RETICCTPCT in the last 72 hours. Sepsis Labs: No results for input(s): PROCALCITON, LATICACIDVEN in the last 168 hours.  No results found for this or any previous visit (from the past 240 hour(s)).       Radiology Studies: No results found.      Scheduled Meds: . aspirin EC  81 mg Oral Daily  . baclofen  10 mg Oral QID  . enoxaparin (LOVENOX) injection  90 mg Subcutaneous QHS  . gabapentin  600 mg Oral BID  . Influenza vac split quadrivalent PF  0.5 mL Intramuscular Tomorrow-1000  . pantoprazole  40 mg Oral Daily  . potassium chloride  40 mEq Oral Daily  . promethazine  25 mg Oral Q8H  .  sodium chloride flush  3 mL Intravenous Q12H   Continuous Infusions: . sodium chloride    . furosemide       LOS: 3 days    Time spent: 35    Delaine LameShrey C Jibreel Fedewa, MD [facility]  If 7PM-7AM, please contact night-coverage [email or web address] Password [email or web address] 06/02/2018, 11:24 AM \

## 2018-06-02 NOTE — Progress Notes (Signed)
Bilateral lower extremity venous duplex has been completed. Preliminary results can be found in CV Proc through chart review.   06/02/18 2:00 PM Olen Cordial RVT

## 2018-06-02 NOTE — Care Management Important Message (Signed)
Important Message  Patient Details  Name: [name] MRN: [id number] Date of Birth: October 16, 1965   Medicare Important Message Given:  Yes    Danniell Rotundo P Meika Earll 06/02/2018, 12:19 PM

## 2018-06-02 NOTE — Consult Note (Signed)
            [facility] CM Primary Care Navigator  06/02/2018  [name] 11-Jun-1965 [id number]   Went to see patientat the bedsideto identify possible discharge needs.  Patientreportsthat he had "increased weakness, joint spasms, slight shortness of breath and swelling to both lower extremities" that had led to this admission, and was found to have acute on chronic diastolic congestive HF currently treated with IV diuresis.(morbid obesity, recurrent fall, chronic pain syndrome).  PatientendorsesDr.David Swayne with [facility] as hisprimary care provider.   [facility] [place] [facility].  Hereports managinghisown medications at home using his "medication organizer".  Patient verbalized that wife [name]) has been driving and providing transportation tohisdoctors' appointments.  Patient lives at [facility] wife who will serve as his primary caregiver when needed.   Anticipated discharge plan ishomeper patient.  Patientvoiced understanding to call primary care provider's officeoncedischarge,for a post discharge follow-up visitwithin1- 2 weeksor sooner if needs arise.Patient letter (with PCP's contact number) was provided asareminder.  Explained topatientregardingTHN CM services available for health managementand resourcesat homeandhe had indicated awareness of managing his health conditions (mainly HF). Patientreports having a weighing scale and plans to regularly monitor/ recordweight, try to beasactive as possible andto follow diet restrictions (low salt), taking medications as ordered and closely following-up with providers when needed.Patient mentioned that his primary care provider was in the process of referring him to a cardiologist prior to this admission.  Patientexpressed understanding to seekreferral to [facility] care managementfrom primary care  provider if deemed necessaryand appropriateforfurther servicesin the near future.   [facility] information provided for future needs that he may have.  Noted order forTHNEMMIHF calls already in place to follow-up patient's recovery after discharge and made patient aware of it.   For additional questions please contact:  Karin Golden A. Joycie Aerts, BSN, RN-BC [facility] PRIMARY CARE Navigator Cell: [phone]

## 2018-06-03 ENCOUNTER — Ambulatory Visit: Payer: No Typology Code available for payment source | Attending: Pain Medicine | Admitting: Pain Medicine

## 2018-06-03 ENCOUNTER — Other Ambulatory Visit: Payer: Self-pay

## 2018-06-03 ENCOUNTER — Encounter: Payer: Self-pay | Admitting: Pain Medicine

## 2018-06-03 ENCOUNTER — Telehealth: Payer: Self-pay

## 2018-06-03 VITALS — BP 118/84 | HR 84 | Temp 98.1°F | Resp 20 | Ht 70.0 in | Wt >= 6400 oz

## 2018-06-03 DIAGNOSIS — M5441 Lumbago with sciatica, right side: Secondary | ICD-10-CM | POA: Insufficient documentation

## 2018-06-03 DIAGNOSIS — G894 Chronic pain syndrome: Secondary | ICD-10-CM | POA: Diagnosis not present

## 2018-06-03 DIAGNOSIS — M961 Postlaminectomy syndrome, not elsewhere classified: Secondary | ICD-10-CM | POA: Diagnosis not present

## 2018-06-03 DIAGNOSIS — M545 Low back pain, unspecified: Secondary | ICD-10-CM

## 2018-06-03 DIAGNOSIS — Z451 Encounter for adjustment and management of infusion pump: Secondary | ICD-10-CM

## 2018-06-03 DIAGNOSIS — Z978 Presence of other specified devices: Secondary | ICD-10-CM

## 2018-06-03 DIAGNOSIS — G8929 Other chronic pain: Secondary | ICD-10-CM

## 2018-06-03 DIAGNOSIS — M5442 Lumbago with sciatica, left side: Secondary | ICD-10-CM | POA: Insufficient documentation

## 2018-06-03 LAB — COMPREHENSIVE METABOLIC PANEL
AST: 35 U/L (ref 15–41)
Albumin: 4.4 g/dL (ref 3.5–5.0)
Alkaline Phosphatase: 68 U/L (ref 38–126)
Anion gap: 14 (ref 5–15)
BUN: 13 mg/dL (ref 6–20)
CO2: 29 mmol/L (ref 22–32)
Calcium: 9.3 mg/dL (ref 8.9–10.3)
Chloride: 95 mmol/L — ABNORMAL LOW (ref 98–111)
Creatinine, Ser: 1.25 mg/dL — ABNORMAL HIGH (ref 0.61–1.24)
GFR calc Af Amer: >60 mL/min (ref >60)
GFR calc non Af Amer: >60 mL/min (ref >60)
Glucose, Bld: 140 mg/dL — ABNORMAL HIGH (ref 70–99)
Potassium: 4.3 mmol/L (ref 3.5–5.1)
Sodium: 138 mmol/L (ref 135–145)
Total Protein: 8.3 g/dL — ABNORMAL HIGH (ref 6.5–8.1)

## 2018-06-03 LAB — CBC
HCT: 51.4 % (ref 39.0–52.0)
Hemoglobin: 15.8 g/dL (ref 13.0–17.0)
MCH: 28.3 pg (ref 26.0–34.0)
MCHC: 30.7 g/dL (ref 30.0–36.0)
MCV: 91.9 fL (ref 80.0–100.0)
Platelets: 294 10*3/uL (ref 150–400)
RBC: 5.59 MIL/uL (ref 4.22–5.81)
RDW: 13.7 % (ref 11.5–15.5)
WBC: 5.9 10*3/uL (ref 4.0–10.5)
nRBC: 0 % (ref 0.0–0.2)

## 2018-06-03 LAB — COMPREHENSIVE METABOLIC PANEL WITH GFR
ALT: 35 U/L (ref 0–44)
Total Bilirubin: 1.4 mg/dL — ABNORMAL HIGH (ref 0.3–1.2)

## 2018-06-03 LAB — MAGNESIUM: Magnesium: 2.5 mg/dL — ABNORMAL HIGH (ref 1.7–2.4)

## 2018-06-03 MED ORDER — BUMETANIDE 1 MG PO TABS: 1.0000 mg | ORAL_TABLET | Freq: Two times a day (BID) | ORAL | 0 refills | Status: DC

## 2018-06-03 MED ORDER — ASPIRIN 81 MG PO TBEC: 81.0000 mg | DELAYED_RELEASE_TABLET | Freq: Every day | ORAL | 0 refills | Status: DC

## 2018-06-03 NOTE — Discharge Summary (Signed)
Physician Discharge Summary  [name] WER:[id number] DOB: 06/21/1965 DOA: 05/29/2018  PCP: [name], MD  Admit date: 05/29/2018 Discharge date: 06/03/2018  Admitted From: Home Disposition: Home  Recommendations for Outpatient Follow-up:  1. Follow up with PCP in 1-2 weeks 2. Please obtain BMP/CBC in one week 3. Please weigh yourself every day.  Home Health: No Equipment/Devices: CPAP  Discharge Condition: Stable CODE STATUS: Full Diet recommendation: Heart Healthy  Brief/Interim Summary:  #) Acute on chronic diastolic heart failure exacerbation: Patient was admitted with increasing lower extremity swelling and shortness of breath as well as evidence of pulmonary edema.  Patient was given IV diuresis with furosemide.  Echo showed a normal ejection fraction with some diastolic dysfunction.  Patient was discharged on oral Bumex with heart failure teaching.  Doppler ultrasound of lower extremities due to asymmetric edema did not show any evidence of clot.  #) Obstructive sleep apnea noncompliant with CPAP: Patient has had difficulty wearing CPAP both inpatient as an outpatient.  Patient was told to follow-up as an outpatient to continue to adjust CPAP settings so he could tolerated.  #) Chronic pain/opiate withdrawal: Patient has apparently been outpatient slowly weaning off opiates.  He was continued on PRN medications.  He does have an intrathecal baclofen pump.  #) Class III obesity: Counseling was provided.  Discharge Diagnoses:  Principal Problem:   Acute CHF (congestive heart failure) (HCC) Active Problems:   OSA complicated by mild polycythemia    Chronic pain syndrome   Gastroesophageal reflux disease without esophagitis   Morbid obesity with BMI of 50.0-59.9, adult [facility])    Discharge Instructions  Discharge Instructions    Call MD for:  difficulty breathing, headache or visual disturbances   Complete by:  As directed    Call MD for:  hives   Complete by:   As directed    Call MD for:  persistant dizziness or light-headedness   Complete by:  As directed    Call MD for:  persistant nausea and vomiting   Complete by:  As directed    Call MD for:  redness, tenderness, or signs of infection (pain, swelling, redness, odor or green/yellow discharge around incision site)   Complete by:  As directed    Call MD for:  severe uncontrolled pain   Complete by:  As directed    Call MD for:  temperature >100.4   Complete by:  As directed    Diet - low sodium heart healthy   Complete by:  As directed    Discharge instructions   Complete by:  As directed    Please weigh yourself every day.  Please double up on your water pills if you notice any increasing swelling in your legs, your weight is up by more than 5 pounds, you are short of breath lying down flat.   Increase activity slowly   Complete by:  As directed      Allergies as of 06/03/2018      Reactions   Tape Other (See Comments)   Blisters      Medication List    STOP taking these medications   BENEFIBER Powd     TAKE these medications   aspirin 81 MG EC tablet Take 1 tablet (81 mg total) by mouth daily. Start taking on:  June 04, 2018   baclofen 10 MG tablet Commonly known as:  LIORESAL Take 1 tablet (10 mg total) by mouth 4 (four) times daily. Start taking on:  June 30, 2018  bumetanide 1 MG tablet Commonly known as:  BUMEX Take 1 tablet (1 mg total) by mouth 2 (two) times daily for 30 days.   cloNIDine 0.1 mg/24hr patch Commonly known as:  CATAPRES - Dosed in mg/24 hr Place 1 patch (0.1 mg total) onto the skin once a week for 14 days. Discontinue if blood pressure is too low.   esomeprazole 40 MG capsule Commonly known as:  NEXIUM Take 1 capsule (40 mg total) by mouth 2 (two) times daily. Start taking on:  June 30, 2018   Gabapentin Enacarbil 600 MG Tbcr Commonly known as:  HORIZANT Take 1 tablet (600 mg total) by mouth 2 (two) times daily. Start taking on:   June 30, 2018   [facility] MILK OF MAGNESIA 400 MG/5ML suspension Generic drug:  magnesium hydroxide Take 5 mLs by mouth daily as needed for mild constipation.   Magnesium Oxide 500 MG Caps Take 1 capsule (500 mg total) by mouth 2 (two) times daily at 8 am and 10 pm. Start taking on:  June 30, 2018   NON FORMULARY 959 mcg by Intrathecal route daily. IT pump Fentanyl 2,000.0 mcg/ml Bupivicaine 20.0 mg/ml 40 ml pump   ondansetron 8 MG disintegrating tablet Commonly known as:  ZOFRAN-ODT Take 1 tablet (8 mg total) by mouth every 8 (eight) hours as needed for nausea or vomiting. Start taking on:  June 30, 2018   Oxycodone HCl 10 MG Tabs Take 1 tablet (10 mg total) by mouth every 6 (six) hours as needed. Must last 30 days. Max: 4/day. What changed:    reasons to take this  Another medication with the same name was removed. Continue taking this medication, and follow the directions you see here.   polycarbophil 625 MG tablet Commonly known as:  FIBERCON Take 625 mg by mouth 3 (three) times daily.   PRESCRIPTION MEDICATION morphine ambulatory pump   promethazine 25 MG tablet Commonly known as:  PHENERGAN Take 25 mg by mouth every 8 (eight) hours.       Allergies  Allergen Reactions  . Tape Other (See Comments)    Blisters     Consultations:  None   Procedures/Studies: Dg Chest 2 View  Result Date: 05/29/2018 CLINICAL DATA:  Generalized weakness, shortness of breath, multiple falls in last several months, CHF, GERD EXAM: CHEST - 2 VIEW COMPARISON:  01/04/2015 FINDINGS: Enlargement of cardiac silhouette with pulmonary vascular congestion. Enlarged central pulmonary arteries. Mediastinal contours normal. Slight chronic accentuation of perihilar markings little changed. No definite acute infiltrate, pleural effusion or pneumothorax. Bones unremarkable. IMPRESSION: Enlargement of cardiac silhouette with pulmonary vascular congestion. Question pulmonary arterial hypertension.  No acute abnormalities. Electronically Signed   By: Ulyses Southward M.D.   On: 05/29/2018 18:47   Ct Head Wo Contrast  Result Date: 05/29/2018 CLINICAL DATA:  Multiple recent falls, generalized weakness, visual changes and memory loss. EXAM: CT HEAD WITHOUT CONTRAST TECHNIQUE: Contiguous axial images were obtained from the base of the skull through the vertex without intravenous contrast. COMPARISON:  Head CT dated 12/22/2011. FINDINGS: Brain: Ventricles are normal in size. There is no mass, hemorrhage, edema or other evidence of acute parenchymal abnormality. No extra-axial hemorrhage. Vascular: No hyperdense vessel or unexpected calcification. Skull: Normal. Negative for fracture or focal lesion. Sinuses/Orbits: No acute finding. Other: None. IMPRESSION: Negative head CT. No intracranial mass, hemorrhage or edema. Electronically Signed   By: Bary Richard M.D.   On: 05/29/2018 18:44   Vas [place] Lower Extremity Venous (dvt)  Result Date:  06/02/2018  Lower Venous Study Indications: Edema.  Limitations: Body habitus and poor ultrasound/tissue interface. Performing Technologist: Chanda Busing RVT  Examination Guidelines: A complete evaluation includes B-mode imaging, spectral Doppler, color Doppler, and power Doppler as needed of all accessible portions of each vessel. Bilateral testing is considered an integral part of a complete examination. Limited examinations for reoccurring indications may be performed as noted.  Right Venous Findings: +---------+---------------+---------+-----------+----------+--------------+          CompressibilityPhasicitySpontaneityPropertiesSummary        +---------+---------------+---------+-----------+----------+--------------+ CFV      Full           Yes      Yes                                 +---------+---------------+---------+-----------+----------+--------------+ SFJ      Full                                                         +---------+---------------+---------+-----------+----------+--------------+ FV Prox  Full                                                        +---------+---------------+---------+-----------+----------+--------------+ FV Mid   Full                                                        +---------+---------------+---------+-----------+----------+--------------+ FV Distal               Yes      Yes                                 +---------+---------------+---------+-----------+----------+--------------+ PFV      Full                                                        +---------+---------------+---------+-----------+----------+--------------+ POP      Full           Yes      Yes                                 +---------+---------------+---------+-----------+----------+--------------+ PTV      Full                                                        +---------+---------------+---------+-----------+----------+--------------+ PERO  Not visualized +---------+---------------+---------+-----------+----------+--------------+  Left Venous Findings: +---------+---------------+---------+-----------+----------+--------------+          CompressibilityPhasicitySpontaneityPropertiesSummary        +---------+---------------+---------+-----------+----------+--------------+ CFV      Full           Yes      Yes                                 +---------+---------------+---------+-----------+----------+--------------+ SFJ      Full                                                        +---------+---------------+---------+-----------+----------+--------------+ FV Prox  Full                                                        +---------+---------------+---------+-----------+----------+--------------+ FV Mid                  Yes      Yes                                  +---------+---------------+---------+-----------+----------+--------------+ FV Distal               Yes      Yes                                 +---------+---------------+---------+-----------+----------+--------------+ PFV      Full                                                        +---------+---------------+---------+-----------+----------+--------------+ POP      Full           Yes      Yes                                 +---------+---------------+---------+-----------+----------+--------------+ PTV      Full                                                        +---------+---------------+---------+-----------+----------+--------------+ PERO                                                  Not visualized +---------+---------------+---------+-----------+----------+--------------+    Summary: Right: There is no evidence of deep vein thrombosis in the lower extremity. However, portions of this examination were limited- see technologist comments above. No cystic structure found in the popliteal fossa. Left: There is no evidence of  deep vein thrombosis in the lower extremity. However, portions of this examination were limited- see technologist comments above. No cystic structure found in the popliteal fossa.  *See table(s) above for measurements and observations. Electronically signed by Lemar Livings MD on 06/02/2018 at 2:10:13 PM.    Final     Echo 05/30/2018:1. The left ventricle has normal systolic function with an ejection fraction of 60-65%. The cavity size was normal. Left ventricular diastolic Doppler parameters are consistent with pseudonormalization Elevated left ventricular end-diastolic pressure. 2. The right ventricle has normal systolic function. The cavity was normal. There is no increase in right ventricular wall thickness. 3. Left atrial size was mildly dilated. 4. The mitral valve is normal in structure. There is moderate to severe mitral annular  calcification present. 5. The tricuspid valve is normal in structure. 6. The aortic valve is tricuspid. 7. The pulmonic valve was normal in structure.  8. Right atrial pressure is estimated at 3 mmHg.   Subjective:   Discharge Exam: Vitals:   06/03/18 0511 06/03/18 0831  BP: 119/73 113/79  Pulse: 78 77  Resp: 18   Temp: 98 F (36.7 C)   SpO2: 100%    Vitals:   06/02/18 2010 06/03/18 0510 06/03/18 0511 06/03/18 0831  BP: 104/70  119/73 113/79  Pulse: 80  78 77  Resp: 18  18   Temp: 98.3 F (36.8 C)  98 F (36.7 C)   TempSrc: Oral  Oral   SpO2: 97%  100%   Weight:  (!) 181.6 kg    Height:        General exam: Appears calm and comfortable  Respiratory system: No increased work of breathing, diminished lung sounds at bases, no wheezes, crackles, rhonchi Cardiovascular system: Distant heart sounds, regular rate and rhythm, no murmurs Gastrointestinal system: Soft, nondistended, no rebound or guarding, plus bowel sounds Central nervous system: Alert and oriented.  Grossly intact, moving all extremities. Extremities: Right greater than left 1+ lower extremity edema Skin: Chronic venous stasis dermatitis, in left quadrant of abdomen implanted morphine pump noted Psychiatry: Judgement and insight appear normal. Mood & affect appropriate.       The results of significant diagnostics from this hospitalization (including imaging, microbiology, ancillary and laboratory) are listed below for reference.     Microbiology: No results found for this or any previous visit (from the past 240 hour(s)).   Labs: BNP (last 3 results) Recent Labs    05/29/18 1825  BNP 155.4*   Basic Metabolic Panel: Recent Labs  Lab 05/30/18 0702 05/31/18 0554 06/01/18 0454 06/02/18 0349 06/03/18 0329  NA 139 140 138 139 138  K 3.8 3.5 3.7 4.2 4.3  CL 97* 96* 93* 96* 95*  CO2 30 32 31 32 29  GLUCOSE 104* 108* 113* 112* 140*  BUN 6 9 10 11 13   CREATININE 0.89 1.01 1.01 1.05  1.25*  CALCIUM 8.4* 8.5* 8.8* 8.6* 9.3  MG  --  2.4  --   --  2.5*   Liver Function Tests: Recent Labs  Lab 05/29/18 1934 05/31/18 0554 06/03/18 0329  AST 27 27 35  ALT 28 28 35  ALKPHOS 71 63 68  BILITOT 0.7 1.3* 1.4*  PROT 7.1 7.1 8.3*  ALBUMIN 3.5 3.5 4.4   No results for input(s): LIPASE, AMYLASE in the last 168 hours. No results for input(s): AMMONIA in the last 168 hours. CBC: Recent Labs  Lab 05/29/18 1825 05/30/18 0018 05/30/18 0702 05/31/18 0554 06/03/18 0329  WBC 5.4  6.9 6.4 5.2 5.9  NEUTROABS 3.8  --  4.1 3.1  --   HGB 13.2 13.7 12.5* 13.8 15.8  HCT 45.2 45.6 42.1 46.8 51.4  MCV 95.2 93.8 94.8 93.0 91.9  PLT 243 246 232 269 294   Cardiac Enzymes: Recent Labs  Lab 05/29/18 1825 05/30/18 0018 05/30/18 0702 05/30/18 1243  TROPONINI <0.03 <0.03 <0.03 <0.03   BNP: Invalid input(s): POCBNP CBG: No results for input(s): GLUCAP in the last 168 hours. D-Dimer No results for input(s): DDIMER in the last 72 hours. Hgb A1c No results for input(s): HGBA1C in the last 72 hours. Lipid Profile No results for input(s): CHOL, HDL, LDLCALC, TRIG, CHOLHDL, LDLDIRECT in the last 72 hours. Thyroid function studies No results for input(s): TSH, T4TOTAL, T3FREE, THYROIDAB in the last 72 hours.  Invalid input(s): FREET3 Anemia work up No results for input(s): VITAMINB12, FOLATE, FERRITIN, TIBC, IRON, RETICCTPCT in the last 72 hours. Urinalysis    Component Value Date/Time   COLORURINE YELLOW 05/29/2018 1816   APPEARANCEUR CLEAR 05/29/2018 1816   LABSPEC <1.005 (L) 05/29/2018 1816   PHURINE 6.0 05/29/2018 1816   GLUCOSEU NEGATIVE 05/29/2018 1816   HGBUR NEGATIVE 05/29/2018 1816   BILIRUBINUR NEGATIVE 05/29/2018 1816   KETONESUR NEGATIVE 05/29/2018 1816   PROTEINUR NEGATIVE 05/29/2018 1816   UROBILINOGEN 0.2 08/13/2010 1206   NITRITE NEGATIVE 05/29/2018 1816   LEUKOCYTESUR NEGATIVE 05/29/2018 1816   Sepsis Labs Invalid input(s): PROCALCITONIN,  WBC,   LACTICIDVEN Microbiology No results found for this or any previous visit (from the past 240 hour(s)).   Time coordinating discharge: 35  SIGNED:   Delaine Lame, MD  [facility] 06/03/2018, 10:32 AM  If 7PM-7AM, please contact night-coverage [email or web address] Password [email or web address]

## 2018-06-03 NOTE — Progress Notes (Addendum)
Patient's Name: [name]  MRN: [id number]  Referring Provider: Antony Contras, MD  DOB: 11-Jun-1965  PCP: Antony Contras, MD  DOS: 06/03/2018  Note by: Gaspar Cola, MD  Service setting: Ambulatory outpatient  Specialty: Interventional Pain Management  Patient type: Established  Location: [facility] (AMB) Pain Management Facility  Visit type: Interventional Procedure   Primary Reason for Visit: Interventional Pain Management Treatment. CC: Back Pain and Leg Pain (bilateral)  Procedure:          Intrathecal Drug Delivery System (IDDS):  Type: Analysis & Programming [id number]) 10% rate increase Region: Abdominal Laterality: Left  Type of Pump: Medtronic Synchromed II Delivery Route: Intrathecal Type of Pain Treated: Neuropathic/Nociceptive Primary Medication Class: Opioid/opiate  Medication, Concentration, Infusion Program, & Delivery Rate: Please see scanned programming printout.   Indications: 1. Chronic pain syndrome   2. Failed back surgical syndrome   3. Chronic low back pain (Primary Area of Pain) (Bilateral) (R>L)   4. Chronic low back pain (Bilateral) w/ sciatica (Bilateral)   5. Presence of intrathecal pump   6. Encounter for adjustment or management of infusion pump    Pain Assessment: Self-Reported Pain Score: 5 /10 Clinically the patient looks like a 2/10 Reported level is inconsistent with clinical observations. Information on the proper use of the pain score provided to the patient today.  Intrathecal Pump Therapy Assessment  Manufacturer: Medtronic Synchromed Type: Programmable Volume: 40 mL reservoir MRI compatibility: Yes   Drug content: Primary Medication Class:Opioid Primary Medication:PF-Fentanyl(2,000.0 mcg/ml) Secondary Medication:PF-Bupivacaine(69m/mL) Other Medication:see pump readout   Programming:  Type: Simple continuous. See pump readout for details.   Changes:  Medication Change: None at this point Rate Change: 10%  increase  Reported side-effects or adverse reactions: None reported  Effectiveness: Described as relatively effective, allowing for increase in activities of daily living (ADL) Clinically meaningful improvement in function (CMIF): Sustained CMIF goals met  Plan: Pump refill today  Pre-op Assessment:  Mr. [name] a 53y.o. (year old), male patient, seen today for interventional treatment. He  has a past surgical history that includes Back surgery; morphine pump; Tonsillectomy; Joint replacement; Shoulder arthroscopy with subacromial decompression, rotator cuff repair and bicep tendon repair (Right, 08/18/2013); Shoulder arthroscopy with subacromial decompression and bicep tendon repair (Left, 10/06/2013); and Laparoscopic gastric sleeve resection (N/A, 01/08/2015). Mr. HKissoonhas a current medication list which includes the following prescription(s): aspirin, baclofen, bumetanide, esomeprazole, gabapentin enacarbil, gnp milk of magnesia, magnesium oxide, NON FORMULARY, ondansetron, oxycodone hcl, polycarbophil, PRESCRIPTION MEDICATION, promethazine, and clonidine. His primarily concern today is the Back Pain and Leg Pain (bilateral)  Initial Vital Signs:  Pulse/HCG Rate: 84  Temp: 98.1 F (36.7 C) Resp: 20 BP: 118/84 SpO2: 94 %  BMI: Estimated body mass index is 58.25 kg/m as calculated from the following:   Height as of this encounter: '5\' 10"'  (1.778 m).   Weight as of this encounter: 406 lb (184.2 kg).  Risk Assessment: Allergies: Reviewed. He is allergic to tape.  Allergy Precautions: None required Coagulopathies: Reviewed. None identified.  Blood-thinner therapy: None at this time Active Infection(s): Reviewed. None identified. Mr. HAumilleris afebrile  Site Confirmation: Mr. HHankenwas asked to confirm the procedure and laterality before marking the site Procedure checklist: Completed Consent: Before the procedure and under the influence of no sedative(s), amnesic(s), or  anxiolytics, the patient was informed of the treatment options, risks and possible complications. To fulfill our ethical and legal obligations, as recommended by the American Medical Association's Code of Ethics, I  have informed the patient of my clinical impression; the nature and purpose of the treatment or procedure; the risks, benefits, and possible complications of the intervention; the alternatives, including doing nothing; the risk(s) and benefit(s) of the alternative treatment(s) or procedure(s); and the risk(s) and benefit(s) of doing nothing.  Mr. [name] was provided with information about the general risks and possible complications associated with most interventional procedures. These include, but are not limited to: failure to achieve desired goals, infection, bleeding, organ or nerve damage, allergic reactions, paralysis, and/or death.  In addition, he was informed of those risks and possible complications associated to this particular procedure, which include, but are not limited to: damage to the implant; failure to decrease pain; local, systemic, or serious CNS infections, intraspinal abscess with possible cord compression and paralysis, or life-threatening such as meningitis; bleeding; organ damage; nerve injury or damage with subsequent sensory, motor, and/or autonomic system dysfunction, resulting in transient or permanent pain, numbness, and/or weakness of one or several areas of the body; allergic reactions, either minor or major life-threatening, such as anaphylactic or anaphylactoid reactions.  Furthermore, Mr. [name] was informed of those risks and complications associated with the medications. These include, but are not limited to: allergic reactions (i.e.: anaphylactic or anaphylactoid reactions); endorphine suppression; bradycardia and/or hypotension; water retention and/or peripheral vascular relaxation leading to lower extremity edema and possible stasis ulcers; respiratory  depression and/or shortness of breath; decreased metabolic rate leading to weight gain; swelling or edema; medication-induced neural toxicity; particulate matter embolism and blood vessel occlusion with resultant organ, and/or nervous system infarction; and/or intrathecal granuloma formation with possible spinal cord compression and permanent paralysis.  Before refilling the pump Mr. Hsiao was informed that some of the medications used in the devise may not be FDA approved for such use and therefore it constitutes an off-label use of the medications.  Finally, he was informed that Medicine is not an exact science; therefore, there is also the possibility of unforeseen or unpredictable risks and/or possible complications that may result in a catastrophic outcome. The patient indicated having understood very clearly. We have given the patient no guarantees and we have made no promises. Enough time was given to the patient to ask questions, all of which were answered to the patient's satisfaction. Mr. Murakami has indicated that he wanted to continue with the procedure. Attestation: I, the ordering provider, attest that I have discussed with the patient the benefits, risks, side-effects, alternatives, likelihood of achieving goals, and potential problems during recovery for the procedure that I have provided informed consent. Date  Time: 06/03/2018 12:04 PM  Pre-Procedure Preparation:  Monitoring: As per clinic protocol. Respiration, ETCO2, SpO2, BP, heart rate and rhythm monitor placed and checked for adequate function Safety Precautions: Patient was assessed for positional comfort and pressure points before starting the procedure. Time-out: I initiated and conducted the "Time-out" before starting the procedure, as per protocol. The patient was asked to participate by confirming the accuracy of the "Time Out" information. Verification of the correct person, site, and procedure were performed and confirmed  by me, the nursing staff, and the patient. "Time-out" conducted as per Joint Commission's Universal Protocol (UP.01.01.01). Time:    Description of Procedure:          Description of the Procedure: Pump programming accessed, analyzed, and changes programmed.  Post programming printout obtained.  A copy of the printout was given to the patient and another copy was scanned into the system.  Vitals:   06/03/18 1202 06/03/18  1204  BP: 118/84   Pulse: 84   Resp: 20   Temp: 98.1 F (36.7 C)   SpO2: 94%   Weight: (!) 400 lb (181.4 kg) (!) 406 lb (184.2 kg)  Height: '5\' 10"'  (1.778 m)     Start Time:   hrs. End Time:   hrs.  Imaging Guidance:          Type of Imaging Technique: None used  Post-operative Assessment:  Post-procedure Vital Signs:  Pulse/HCG Rate: 84  Temp: 98.1 F (36.7 C) Resp: 20 BP: 118/84 SpO2: 94 %  EBL: None  Complications: No immediate post-treatment complications observed by team, or reported by patient.  Note: The patient tolerated the entire procedure well.  Comments:  As the patient was leaving the clinic, I overheard him and ask the receptionist as to when he could come back for another increase in the pump.  Clearly this is abuse.  He has not even given time for the current increase to take effect and he is already asking when he can come in for another one.  This tells me that he is really not coming in because he has significant pain, is coming in to see how much we can keep pushing this pump up.  This is not appropriate and based on my calculations, he is getting more than enough medication.  In fact, if we look at today's vitals they do not go along with the pain that he describes having.  His not tachycardic, he is also not hypertensive or tachypneic.  He is not sweating and he is also not pacing around like he was when he first came in after we had initially switched the medication on the pump from the morphine to the fentanyl.  At that time, yes, he was  having withdrawals, but not at this time.  The fact the matter is that today he does not look like he is having any significant amount of pain and he is also not taking his oral medications.  In fact, when he comes back, we will start going down on his oral narcotics to completely stop those.  Plan of Care   Possible POC:  No further increases in the pump programming.  See my note above.   Imaging Orders  No imaging studies ordered today   Procedure Orders    No procedure(s) ordered today   Medications ordered for procedure: No orders of the defined types were placed in this encounter.  Medications administered: Jeneen Rinks B. Everson had no medications administered during this visit.  See the medical record for exact dosing, route, and time of administration.  Disposition: Discharge home  Discharge Date & Time: 06/03/2018; 1235 hrs.   Physician-requested Follow-up: Return for Pump Refill (as per pump program) (Max:6mo.  Future Appointments  Date Time Provider [place] 06/08/2018 10:40 AM AElouise Munroe MD [facility] 06/30/2018  1:15 PM NMilinda Pointer MD [facility] None  08/25/2018 11:45 AM [facility]ilinda Pointer [facility]   Primary Care Physician: SAntony Contras MD [facility] Note by: FGaspar Cola MD Date: 06/03/2018; Time: 12:37 PM  Disclaimer:  Medicine is not an eChief Strategy Officer The only guarantee in medicine is that nothing is guaranteed. It is important to note that the decision to proceed with this intervention was based on the information collected from the patient. The Data and conclusions were drawn from the patient's questionnaire, the interview, and the physical examination. Because the information was provided  in large part by the patient, it cannot be guaranteed that it has not been purposely or unconsciously manipulated. Every effort has been made to obtain as much relevant data as possible for  this evaluation. It is important to note that the conclusions that lead to this procedure are derived in large part from the available data. Always take into account that the treatment will also be dependent on availability of resources and existing treatment guidelines, considered by other Pain Management Practitioners as being common knowledge and practice, at the time of the intervention. For Medico-Legal purposes, it is also important to point out that variation in procedural techniques and pharmacological choices are the acceptable norm. The indications, contraindications, technique, and results of the above procedure should only be interpreted and judged by a Board-Certified Interventional Pain Specialist with extensive familiarity and expertise in the same exact procedure and technique.

## 2018-06-03 NOTE — Discharge Instructions (Signed)

## 2018-06-03 NOTE — Patient Instructions (Addendum)
______________________________________________________________________________________________  Specialty Pain Scale  Introduction:  There are significant differences in how pain is reported. The word pain usually refers to physical pain, but it is also a common synonym of suffering. The medical community uses a scale from 0 (zero) to 10 (ten) to report pain level. Zero (0) is described as "no pain", while ten (10) is described as "the worse pain you can imagine". The problem with this scale is that physical pain is reported along with suffering. Suffering refers to mental pain, or more often yet it refers to any unpleasant feeling, emotion or aversion associated with the perception of harm or threat of harm. It is the psychological component of pain.  Pain Specialists prefer to separate the two components. The pain scale used by this practice is the Verbal Numerical Rating Scale (VNRS-11). This scale is for the physical pain only. DO NOT INCLUDE how your pain psychologically affects you. This scale is for adults 21 years of age and older. It has 11 (eleven) levels. The 1st level is 0/10. This means: "right now, I have no pain". In the context of pain management, it also means: "right now, my physical pain is under control with the current therapy".  General Information:  The scale should reflect your current level of pain. Unless you are specifically asked for the level of your worst pain, or your average pain. If you are asked for one of these two, then it should be understood that it is over the past 24 hours.  Levels 1 (one) through 5 (five) are described below, and can be treated as an outpatient. Ambulatory pain management facilities such as ours are more than adequate to treat these levels. Levels 6 (six) through 10 (ten) are also described below, however, these must be treated as a hospitalized patient. While levels 6 (six) and 7 (seven) may be evaluated at an urgent care facility, levels 8  (eight) through 10 (ten) constitute medical emergencies and as such, they belong in a hospital's emergency department. When having these levels (as described below), do not come to our office. Our facility is not equipped to manage these levels. Go directly to an urgent care facility or an emergency department to be evaluated.  Definitions:  Activities of Daily Living (ADL): Activities of daily living (ADL or ADLs) is a term used in healthcare to refer to people's daily self-care activities. Health professionals often use a person's ability or inability to perform ADLs as a measurement of their functional status, particularly in regard to people post injury, with disabilities and the elderly. There are two ADL levels: Basic and Instrumental. Basic Activities of Daily Living (BADL  or BADLs) consist of self-care tasks that include: Bathing and showering; personal hygiene and grooming (including brushing/combing/styling hair); dressing; Toilet hygiene (getting to the toilet, cleaning oneself, and getting back up); eating and self-feeding (not including cooking or chewing and swallowing); functional mobility, often referred to as "transferring", as measured by the ability to walk, get in and out of bed, and get into and out of a chair; the broader definition (moving from one place to another while performing activities) is useful for people with different physical abilities who are still able to get around independently. Basic ADLs include the things many people do when they get up in the morning and get ready to go out of the house: get out of bed, go to the toilet, bathe, dress, groom, and eat. On the average, loss of function typically follows a particular order.   Hygiene is the first to go, followed by loss of toilet use and locomotion. The last to go is the ability to eat. When there is only one remaining area in which the person is independent, there is a 62.9% chance that it is eating and only a 3.5% chance  that it is hygiene. Instrumental Activities of Daily Living (IADL or IADLs) are not necessary for fundamental functioning, but they let an individual live independently in a community. IADL consist of tasks that include: cleaning and maintaining the house; home establishment and maintenance; care of others (including selecting and supervising caregivers); care of pets; child rearing; managing money; managing financials (investments, etc.); meal preparation and cleanup; shopping for groceries and necessities; moving within the community; safety procedures and emergency responses; health management and maintenance (taking prescribed medications); and using the telephone or other form of communication.  Instructions:  Most patients tend to report their pain as a combination of two factors, their physical pain and their psychosocial pain. This last one is also known as "suffering" and it is reflection of how physical pain affects you socially and psychologically. From now on, report them separately.  From this point on, when asked to report your pain level, report only your physical pain. Use the following table for reference.  Pain Clinic Pain Levels (0-5/10)  Pain Level Score  Description  No Pain 0   Mild pain 1 Nagging, annoying, but does not interfere with basic activities of daily living (ADL). Patients are able to eat, bathe, get dressed, toileting (being able to get on and off the toilet and perform personal hygiene functions), transfer (move in and out of bed or a chair without assistance), and maintain continence (able to control bladder and bowel functions). Blood pressure and heart rate are unaffected. A normal heart rate for a healthy adult ranges from 60 to 100 bpm (beats per minute).   Mild to moderate pain 2 Noticeable and distracting. Impossible to hide from other people. More frequent flare-ups. Still possible to adapt and function close to normal. It can be very annoying and may have  occasional stronger flare-ups. With discipline, patients may get used to it and adapt.   Moderate pain 3 Interferes significantly with activities of daily living (ADL). It becomes difficult to feed, bathe, get dressed, get on and off the toilet or to perform personal hygiene functions. Difficult to get in and out of bed or a chair without assistance. Very distracting. With effort, it can be ignored when deeply involved in activities.   Moderately severe pain 4 Impossible to ignore for more than a few minutes. With effort, patients may still be able to manage work or participate in some social activities. Very difficult to concentrate. Signs of autonomic nervous system discharge are evident: dilated pupils (mydriasis); mild sweating (diaphoresis); sleep interference. Heart rate becomes elevated (>115 bpm). Diastolic blood pressure (lower number) rises above 100 mmHg. Patients find relief in laying down and not moving.   Severe pain 5 Intense and extremely unpleasant. Associated with frowning face and frequent crying. Pain overwhelms the senses.  Ability to do any activity or maintain social relationships becomes significantly limited. Conversation becomes difficult. Pacing back and forth is common, as getting into a comfortable position is nearly impossible. Pain wakes you up from deep sleep. Physical signs will be obvious: pupillary dilation; increased sweating; goosebumps; brisk reflexes; cold, clammy hands and feet; nausea, vomiting or dry heaves; loss of appetite; significant sleep disturbance with inability to fall asleep or to   remain asleep. When persistent, significant weight loss is observed due to the complete loss of appetite and sleep deprivation.  Blood pressure and heart rate becomes significantly elevated. Caution: If elevated blood pressure triggers a pounding headache associated with blurred vision, then the patient should immediately seek attention at an urgent or emergency care unit, as  these may be signs of an impending stroke.    Emergency Department Pain Levels (6-10/10)  Emergency Room Pain 6 Severely limiting. Requires emergency care and should not be seen or managed at an outpatient pain management facility. Communication becomes difficult and requires great effort. Assistance to reach the emergency department may be required. Facial flushing and profuse sweating along with potentially dangerous increases in heart rate and blood pressure will be evident.   Distressing pain 7 Self-care is very difficult. Assistance is required to transport, or use restroom. Assistance to reach the emergency department will be required. Tasks requiring coordination, such as bathing and getting dressed become very difficult.   Disabling pain 8 Self-care is no longer possible. At this level, pain is disabling. The individual is unable to do even the most "basic" activities such as walking, eating, bathing, dressing, transferring to a bed, or toileting. Fine motor skills are lost. It is difficult to think clearly.   Incapacitating pain 9 Pain becomes incapacitating. Thought processing is no longer possible. Difficult to remember your own name. Control of movement and coordination are lost.   The worst pain imaginable 10 At this level, most patients pass out from pain. When this level is reached, collapse of the autonomic nervous system occurs, leading to a sudden drop in blood pressure and heart rate. This in turn results in a temporary and dramatic drop in blood flow to the brain, leading to a loss of consciousness. Fainting is one of the body's self defense mechanisms. Passing out puts the brain in a calmed state and causes it to shut down for a while, in order to begin the healing process.    Summary: 1.   Refer to this scale when providing us with your pain level. 2.   Be accurate and careful when reporting your pain level. This will help with your care. 3.   Over-reporting your pain level  will lead to loss of credibility. 4.   Even a level of 1/10 means that there is pain and will be treated at our facility. 5.   High, inaccurate reporting will be documented as "Symptom Exaggeration", leading to loss of credibility and suspicions of possible secondary gains such as obtaining more narcotics, or wanting to appear disabled, for fraudulent reasons. 6.   Only pain levels of 5 or below will be seen at our facility. 7.   Pain levels of 6 and above will be sent to the Emergency Department and the appointment cancelled.  ______________________________________________________________________________________________     Opioid Overdose Opioids are substances that relieve pain by binding to pain receptors in your brain and spinal cord. Opioids include illegal drugs, such as heroin, as well as prescription pain medicines.An opioid overdose happens when you take too much of an opioid substance. This can happen with any type of opioid, including:  Heroin.  Morphine.  Codeine.  Methadone.  Oxycodone.  Hydrocodone.  Fentanyl.  Hydromorphone.  Buprenorphine. The effects of an overdose can be mild, dangerous, or even deadly. Opioid overdose is a medical emergency. What are the causes? This condition may be caused by:  Taking too much of an opioid by accident.  Taking too   much of an opioid on purpose.  An error made by a health care provider who prescribes a medicine.  An error made by the pharmacist who fills the prescription order.  Using more than one substance that contains opioids at the same time.  Mixing an opioid with a substance that affects your heart, breathing, or blood pressure. These include alcohol, tranquilizers, sleeping pills, illegal drugs, and some over-the-counter medicines. What increases the risk? This condition is more likely in:  Children. They may be attracted to colorful pills. Because of a child's small size, even a small amount of a drug can  be dangerous.  Elderly people. They may be taking many different drugs. Elderly people may have difficulty reading labels or remembering when they last took their medicine.  People who take an opioid on a long-term basis.  People who use: ? Illegal drugs. ? Other substances, including alcohol, while using an opioid.  People who have: ? A history of drug or alcohol abuse. ? Certain mental health conditions.  People who take opioids that are not prescribed for them. What are the signs or symptoms? Symptoms of this condition depend on the type of opioid and the amount that was taken. Common symptoms include:  Sleepiness or difficulty waking from sleep.  Confusion.  Slurred speech.  Slowed breathing and a slow pulse.  Nausea and vomiting.  Abnormally small pupils. Signs and symptoms that require emergency treatment include:  Cold, clammy, and pale skin.  Blue lips and fingernails.  Vomiting.  Gurgling sounds in the throat.  A pulse that is very slow or difficult to detect.  Breathing that is very slow, noisy, or difficult to detect.  Limp body.  Inability to respond to speech or be awakened from sleep (stupor). How is this diagnosed? This condition is diagnosed based on your symptoms. It is important to tell your health care provider:  All of the opioidsthat you took.  When you took the opioids.  Whether you were drinking alcohol or using other substances. Your health care provider will do a physical exam. This exam may include:  Checking and monitoring your heart rate and rhythm, your breathing rate and depth, your temperature, and your blood pressure (vital signs).  Checking for abnormally small pupils.  Measuring oxygen levels in your blood. You may also have blood tests or urine tests. How is this treated? Supporting your vital signs and your breathing is the first step in treating an opioid overdose. Treatment may also include:  Giving fluids and  minerals (electrolytes) through an IV tube.  Inserting a breathing tube (endotracheal tube) in your airway to help you breathe.  Giving oxygen.  Passing a tube through your nose and into your stomach (NG tube, or nasogastric tube) to wash out your stomach.  Giving medicines that: ? Increase your blood pressure. ? Absorb any opioid that is in your digestive system. ? Reverse the effects of the opioid (naloxone).  Ongoing counseling and mental health support if you intentionally overdosed or used an illegal drug. Follow these instructions at home:   Take over-the-counter and prescription medicines only as told by your health care provider. Always ask your health care provider about possible side effects and interactions of any new medicine that you start taking.  Keep a list of all of the medicines that you take, including over-the-counter medicines. Bring this list with you to all of your medical visits.  Drink enough fluid to keep your urine clear or pale yellow.  Keep all   follow-up visits as told by your health care provider. This is important. How is this prevented?  Get help if you are struggling with: ? Alcohol or drug use. ? Depression or another mental health problem.  Keep the phone number of your local poison control center near your phone or on your cell phone.  Store all medicines in safety containers that are out of the reach of children.  Read the drug inserts that come with your medicines.  Do not drink alcohol when taking opioids.  Do not use illegal drugs.  Do not take opioid medicines that are not prescribed for you. Contact a health care provider if:  Your symptoms return.  You develop new symptoms or side effects when you are taking medicines. Get help right away if:  You think that you or someone else may have taken too much of an opioid. The hotline of the National Poison Control Center is (800) 222-1222.  You or someone else is having symptoms of  an opioid overdose.  You have serious thoughts about hurting yourself or others.  You have: ? Chest pain. ? Difficulty breathing. ? A loss of consciousness. Opioid overdose is an emergency. Do not wait to see if the symptoms will go away. Get medical help right away. Call your local emergency services (911 in the [place] Do not drive yourself to the hospital. This information is not intended to replace advice given to you by your health care provider. Make sure you discuss any questions you have with your health care provider. Document Released: 05/08/2004 Document Revised: 10/16/2016 Document Reviewed: 09/14/2014 Elsevier Interactive Patient Education  2019 [employer]  

## 2018-06-03 NOTE — Telephone Encounter (Signed)
Per Dr. [name], ok to come now.

## 2018-06-03 NOTE — Telephone Encounter (Signed)
Patient wants to come in today for a pump increase. Is this ok?

## 2018-06-03 NOTE — Progress Notes (Addendum)
Safety precautions to be maintained throughout the outpatient stay will include: orient to surroundings, keep bed in low position, maintain call bell within reach at all times, provide assistance with transfer out of bed and ambulation.  IT pump increased 10% today.  See printout.

## 2018-06-07 ENCOUNTER — Telehealth: Payer: Self-pay | Admitting: Pain Medicine

## 2018-06-07 NOTE — Telephone Encounter (Signed)
Spoke with patient.  Notified that Dr [name] will not be doing any changes at this time to the pump dosage.

## 2018-06-07 NOTE — Telephone Encounter (Signed)
Pt called and said he would like a nurse to call him back he states he is still in a lot of pain

## 2018-06-08 ENCOUNTER — Ambulatory Visit: Payer: Medicare Other | Admitting: Internal Medicine

## 2018-06-08 ENCOUNTER — Encounter: Payer: Self-pay | Admitting: Internal Medicine

## 2018-06-08 VITALS — BP 116/74 | HR 74 | Ht 70.5 in | Wt 386.2 lb

## 2018-06-08 DIAGNOSIS — I5032 Chronic diastolic (congestive) heart failure: Secondary | ICD-10-CM | POA: Diagnosis not present

## 2018-06-08 DIAGNOSIS — G4733 Obstructive sleep apnea (adult) (pediatric): Secondary | ICD-10-CM | POA: Diagnosis not present

## 2018-06-08 MED ORDER — BUMETANIDE 1 MG PO TABS: 1.0000 mg | ORAL_TABLET | Freq: Two times a day (BID) | ORAL | 5 refills | Status: DC

## 2018-06-08 NOTE — Patient Instructions (Signed)
Medication Instructions:  Dr.Acharya recommends that you continue on your current medications as directed. Please refer to the Current Medication list given to you today.  Bumex has been refilled today   If you need a refill on your cardiac medications before your next appointment, please call your pharmacy.   Lab work: None ordered  Testing/Procedures: Your physician has recommended that you have a sleep study. This test records several body functions during sleep, including: brain activity, eye movement, oxygen and carbon dioxide blood levels, heart rate and rhythm, breathing rate and rhythm, the flow of air through your mouth and nose, snoring, body muscle movements, and chest and belly movement. (Our sleep coordinator Burna Mortimer will contact you regarding the next steps)  Follow-Up: At [facility], you and your health needs are our priority.  As part of our continuing mission to provide you with exceptional heart care, we have created designated Provider Care Teams.  These Care Teams include your primary Cardiologist (physician) and Advanced Practice Providers (APPs -  Physician Assistants and Nurse Practitioners) who all work together to provide you with the care you need, when you need it. You will need a follow up appointment in 6 weeks.    You may see Dr. Jacques Navy or one of the following Advanced Practice Providers on your designated Care Team:   Theodore Demark, PA-C . Joni Reining, DNP, ANP

## 2018-06-08 NOTE — Progress Notes (Signed)
Cardiology Office Note:    Date:  06/08/2018   ID:  [name], DOB [birth year], MRN [id number]  PCP:  [name], MD  Cardiologist:  No primary care provider on file.  Electrophysiologist:  None   Referring MD: [name], MD   Fluid retention  History of Present Illness:    [name] is a 53 y.o. male with a hx of OSA with previous CPAP prescription, not currently using routinely,  heart failure with preserved ejection fraction, back injury with chronic back pain and pain control infusion pump, GERD, mild asthma, who presents today for evaluation of significant volume overload.   He was recently hospitalized for acute on chronic diastolic heart failure. He is noted to have grade 2 diastolic dysfunction and elevated LV filling pressure. He was dismissed on bumetanide after IV diuresis.   Today he notes continued volume overload. He feels he has gained a significant amount of weight in the past several months, but feels this is all fluid weight. He is unable to to exercise due to significant chronic back pain. He denies chest pain, and has no significant shortness of breath with exertion.   The patient denies chest pain, chest pressure, dyspnea at rest or with exertion, palpitations, PND, orthopnea. Denies syncope or presyncope. Denies dizziness or lightheadedness.  Past Medical History:  Diagnosis Date  . Arthritis   . Asthma    as child  . Back pain   . CHF (congestive heart failure) (HCC)   . GERD (gastroesophageal reflux disease)   . Hernia   . History of hiatal hernia   . Hypogonadism in male   . Neuromuscular disorder (HCC)    back injury with surgery  . PONV (postoperative nausea and vomiting)    gets nausea  . Shortness of breath dyspnea   . Sleep apnea    Does not use CPAP, new equipment 12/2014  . Ulcer   . Wears glasses     Past Surgical History:  Procedure Laterality Date  . BACK SURGERY    . JOINT REPLACEMENT     Left knee replacement  .  LAPAROSCOPIC GASTRIC SLEEVE RESECTION N/A 01/08/2015   Procedure: LAPAROSCOPIC GASTRIC SLEEVE RESECTION;  Surgeon: Luretha Murphy, MD;  Location: [facility];  Service: General;  Laterality: N/A;  . morphine pump    . SHOULDER ARTHROSCOPY WITH SUBACROMIAL DECOMPRESSION AND BICEP TENDON REPAIR Left 10/06/2013   Procedure: LEFT SHOULDER ARTHROSCOPY DEBRIDEMENT EXTENTSIVE,DISTAL CLAVICULECTOMY,DECOMPRESSION SUBACROMIAL PARTIAL ACROMIOPLASTY WITH ROTATOR CUFF REPAIR, and excision of CALCIUM DEPOSIT;  Surgeon: Loreta Ave, MD;  Location: [facility];  Service: Orthopedics;  Laterality: Left;  . SHOULDER ARTHROSCOPY WITH SUBACROMIAL DECOMPRESSION, ROTATOR CUFF REPAIR AND BICEP TENDON REPAIR Right 08/18/2013   Procedure: RIGHT SHOULDER ARTHROSCOPY WITH SUBACROMIAL DECOMPRESSION/PARTIAL ACROMIOPLASTY WITH CORACOACROMIAL RELEASE/DISTAL CLAVICULECTOMY/ ROTATOR CUFF REPAIR/DEBRIDEMENT EXTENTSIVE;  Surgeon: Loreta Ave, MD;  Location: [facility];  Service: Orthopedics;  Laterality: Right;  ANESTHESIA: GENERAL, PRE/POST OP SCALENE  . TONSILLECTOMY      Current Medications: Current Meds  Medication Sig  . aspirin EC 81 MG EC tablet Take 1 tablet (81 mg total) by mouth daily.  Marland Kitchen esomeprazole (NEXIUM) 40 MG capsule Take 1 capsule (40 mg total) by mouth 2 (two) times daily. (Patient taking differently: Take 40 mg by mouth daily. )  . GNP MILK OF MAGNESIA 1200 MG/15ML suspension Take 5 mLs by mouth daily as needed for mild constipation.   . Magnesium Oxide 500 MG CAPS Take 1  capsule (500 mg total) by mouth 2 (two) times daily at 8 am and 10 pm.  . NON FORMULARY 959 mcg by Intrathecal route daily. IT pump Fentanyl 2,000.0 mcg/ml Bupivicaine 20.0 mg/ml 40 ml pump  . polycarbophil (FIBERCON) 625 MG tablet Take 625 mg by mouth as needed for mild constipation.   . promethazine (PHENERGAN) 25 MG tablet Take 25 mg by mouth every 8 (eight) hours as needed for nausea.   . [DISCONTINUED]  baclofen (LIORESAL) 10 MG tablet Take 1 tablet (10 mg total) by mouth 4 (four) times daily.  . [DISCONTINUED] bumetanide (BUMEX) 1 MG tablet Take 1 tablet (1 mg total) by mouth 2 (two) times daily for 30 days.  . [DISCONTINUED] bumetanide (BUMEX) 1 MG tablet Take 1 tablet (1 mg total) by mouth 2 (two) times daily for 30 days.  . [DISCONTINUED] bumetanide (BUMEX) 1 MG tablet Take 1 tablet (1 mg total) by mouth 2 (two) times daily for 30 days.  . [DISCONTINUED] cloNIDine (CATAPRES - DOSED IN MG/24 HR) 0.1 mg/24hr patch Place 1 patch (0.1 mg total) onto the skin once a week for 14 days. Discontinue if blood pressure is too low. (Patient not taking: Reported on 06/10/2018)  . [DISCONTINUED] Gabapentin Enacarbil (HORIZANT) 600 MG TBCR Take 1 tablet (600 mg total) by mouth 2 (two) times daily.  . [DISCONTINUED] ondansetron (ZOFRAN-ODT) 8 MG disintegrating tablet Take 1 tablet (8 mg total) by mouth every 8 (eight) hours as needed for nausea or vomiting. (Patient not taking: Reported on 06/30/2018)  . [DISCONTINUED] Oxycodone HCl 10 MG TABS Take 1 tablet (10 mg total) by mouth every 6 (six) hours as needed. Must last 30 days. Max: 4/day. (Patient taking differently: Take 10 mg by mouth every 6 (six) hours as needed (pain). Must last 30 days. Max: 4/day.)  . [DISCONTINUED] Oxycodone HCl 10 MG TABS Take 10 mg by mouth every 6 (six) hours as needed.  . [DISCONTINUED] PRESCRIPTION MEDICATION Intrathecal pump Fentanyl 2,000.0 mcg/ml Bupivicaine 20.0 mg/ml 1,056.1 mcg/day     Allergies:   Tape   Social History   Socioeconomic History  . Marital status: Married    Spouse name: Not on file  . Number of children: Not on file  . Years of education: Not on file  . Highest education level: Not on file  Occupational History  . Not on file  Social Needs  . Financial resource strain: Not on file  . Food insecurity:    Worry: Not on file    Inability: Not on file  . Transportation needs:    Medical: Not on  file    Non-medical: Not on file  Tobacco Use  . Smoking status: Never Smoker  . Smokeless tobacco: Current User    Types: Snuff  Substance and Sexual Activity  . Alcohol use: Yes    Comment: 2-7 beers per day, starting in Oct 2019.  . Drug use: No  . Sexual activity: Yes  Lifestyle  . Physical activity:    Days per week: Not on file    Minutes per session: Not on file  . Stress: Not on file  Relationships  . Social connections:    Talks on phone: Not on file    Gets together: Not on file    Attends religious service: Not on file    Active member of club or organization: Not on file    Attends meetings of clubs or organizations: Not on file    Relationship status: Not on file  Other Topics Concern  . Not on file  Social History Narrative  . Not on file     Family History: The patient's family history includes Diabetes in his mother; Pneumonia in his father.  ROS:   Please see the history of present illness.    All other systems reviewed and are negative.  EKGs/Labs/Other Studies Reviewed:    The following studies were reviewed today:  EKG:  SR, T wave abnormality in precordial leads.  Recent Labs: 07/11/2018: ALT 24; B Natriuretic Peptide 348.1; Magnesium 2.1; TSH 0.938 07/15/2018: BUN 14; Creatinine, Ser 0.78; Hemoglobin 14.9; Platelets 220; Potassium 4.5; Sodium 136  Recent Lipid Panel    Component Value Date/Time   TRIG 76 06/11/2018 0539    Physical Exam:    VS:  BP 116/74   Pulse 74   Ht 5' 10.5" (1.791 m)   Wt (!) 386 lb 3.2 oz (175.2 kg)   BMI 54.63 kg/m     Wt Readings from Last 3 Encounters:  07/15/18 (!) 373 lb 7.4 oz (169.4 kg)  06/30/18 (!) 378 lb (171.5 kg)  06/14/18 (!) 381 lb 6.3 oz (173 kg)     Constitutional: No acute distress Eyes: sclera non-icteric, normal conjunctiva and lids ENMT: normal dentition, moist mucous membranes Cardiovascular: regular rhythm, normal rate, no murmurs. S1 and S2 normal. Radial pulses normal bilaterally.  No jugular venous distention, though difficult to appreciate due to body habitus.  Respiratory: clear to auscultation bilaterally GI : normal bowel sounds, soft and nontender. No distention. Obese abdomen.  MSK: extremities warm, well perfused. 1 + bilateral edema.  NEURO: grossly nonfocal exam, moves all extremities. PSYCH: alert and oriented x 3, normal mood and affect.   ASSESSMENT:    1. Chronic diastolic congestive heart failure (HCC)   2. OSA complicated by mild polycythemia    3. Obesity, morbid (more than 100 lbs over ideal weight or BMI > 40) (HCC)    PLAN:    Continue bumex and monitor for continued diuresis.  We discussed diet and lifestyle modifications to optimized heart failure management. Discussed activity and alcohol avoidance.   He requires reevaluation for sleep apnea to treat OSA in the setting of HF.   I would like to see the patient back in 6 weeks to continue to address HF management.   Medication Adjustments/Labs and Tests Ordered: Current medicines are reviewed at length with the patient today.  Concerns regarding medicines are outlined above.  Orders Placed This Encounter  Procedures  . EKG 12-Lead  . Split night study    Patient Instructions  Medication Instructions:  Dr.Eufelia Veno recommends that you continue on your current medications as directed. Please refer to the Current Medication list given to you today.  Bumex has been refilled today   If you need a refill on your cardiac medications before your next appointment, please call your pharmacy.   Lab work: None ordered  Testing/Procedures: Your physician has recommended that you have a sleep study. This test records several body functions during sleep, including: brain activity, eye movement, oxygen and carbon dioxide blood levels, heart rate and rhythm, breathing rate and rhythm, the flow of air through your mouth and nose, snoring, body muscle movements, and chest and belly movement. (Our  sleep coordinator Burna Mortimer will contact you regarding the next steps)  Follow-Up: At [facility], you and your health needs are our priority.  As part of our continuing mission to provide you with exceptional heart care, we have created designated Provider  Care Teams.  These Care Teams include your primary Cardiologist (physician) and Advanced Practice Providers (APPs -  Physician Assistants and Nurse Practitioners) who all work together to provide you with the care you need, when you need it. You will need a follow up appointment in 6 weeks.    You may see Dr. Jacques Navy or one of the following Advanced Practice Providers on your designated Care Team:   Theodore Demark, PA-C . Joni Reining, DNP, ANP        Signed, Parke Poisson, MD  06/08/2018 8:34 PM    [facility]

## 2018-06-09 ENCOUNTER — Emergency Department (HOSPITAL_COMMUNITY)
Admission: EM | Admit: 2018-06-09 | Discharge: 2018-06-09 | Disposition: A | Discharge status: Home / Self Care | Payer: Medicare Other | Source: Home / Self Care

## 2018-06-09 ENCOUNTER — Encounter (HOSPITAL_COMMUNITY): Payer: Self-pay | Admitting: *Deleted

## 2018-06-09 ENCOUNTER — Other Ambulatory Visit: Payer: Self-pay

## 2018-06-09 DIAGNOSIS — R109 Unspecified abdominal pain: Secondary | ICD-10-CM | POA: Diagnosis not present

## 2018-06-09 DIAGNOSIS — Z5321 Procedure and treatment not carried out due to patient leaving prior to being seen by health care provider: Secondary | ICD-10-CM | POA: Insufficient documentation

## 2018-06-09 DIAGNOSIS — K852 Alcohol induced acute pancreatitis without necrosis or infection: Secondary | ICD-10-CM | POA: Diagnosis not present

## 2018-06-09 DIAGNOSIS — R1110 Vomiting, unspecified: Secondary | ICD-10-CM

## 2018-06-09 LAB — COMPREHENSIVE METABOLIC PANEL
ALT: 26 U/L (ref 0–44)
AST: 25 U/L (ref 15–41)
Albumin: 4.4 g/dL (ref 3.5–5.0)
Alkaline Phosphatase: 74 U/L (ref 38–126)
Anion gap: 16 — ABNORMAL HIGH (ref 5–15)
BUN: 16 mg/dL (ref 6–20)
CO2: 34 mmol/L — ABNORMAL HIGH (ref 22–32)
Calcium: 9.6 mg/dL (ref 8.9–10.3)
Chloride: 74 mmol/L — ABNORMAL LOW (ref 98–111)
Creatinine, Ser: 0.98 mg/dL (ref 0.61–1.24)
GFR calc Af Amer: >60 mL/min (ref >60)
GFR calc non Af Amer: >60 mL/min (ref >60)
Glucose, Bld: 148 mg/dL — ABNORMAL HIGH (ref 70–99)
Potassium: 2.2 mmol/L — CL (ref 3.5–5.1)
Sodium: 124 mmol/L — ABNORMAL LOW (ref 135–145)
Total Bilirubin: 1.2 mg/dL (ref 0.3–1.2)
Total Protein: 8.7 g/dL — ABNORMAL HIGH (ref 6.5–8.1)

## 2018-06-09 LAB — CBC
HCT: 50.6 % (ref 39.0–52.0)
Hemoglobin: 16.5 g/dL (ref 13.0–17.0)
MCH: 27.1 pg (ref 26.0–34.0)
MCHC: 32.6 g/dL (ref 30.0–36.0)
MCV: 83.2 fL (ref 80.0–100.0)
Platelets: 298 10*3/uL (ref 150–400)
RBC: 6.08 MIL/uL — ABNORMAL HIGH (ref 4.22–5.81)
RDW: 13.2 % (ref 11.5–15.5)
WBC: 11.7 10*3/uL — ABNORMAL HIGH (ref 4.0–10.5)
nRBC: 0 % (ref 0.0–0.2)

## 2018-06-09 LAB — LIPASE, BLOOD: LIPASE: 141 U/L — AB (ref 11–51)

## 2018-06-09 MED ORDER — SODIUM CHLORIDE 0.9% FLUSH: 3.0000 mL | Freq: Once | INTRAVENOUS | Status: DC

## 2018-06-09 NOTE — ED Notes (Signed)
Pt called to be roomed, no answer  

## 2018-06-09 NOTE — Patient Outreach (Signed)
[facility]) Care Management  06/09/2018  IVICA RISENHOOVER 04-25-1965 [id number]   EMMI- Heart Failure RED ON EMMI ALERT Day # 5 Date: 06/08/2018 Red Alert Reason:  New/worsening problems? Yes   Outreach attempt: No answer.  HIPAA compliant voice message left.    Plan: RN CM will attempt again within 4 business days and send letter  Bary Leriche, RN, MSN [facility] Care Management Care Management Coordinator Direct Line [phone] Toll Free: [phone]  Fax: [phone]

## 2018-06-09 NOTE — ED Triage Notes (Signed)
Pt in c/o vomiting for the last few days, until last night it was only after he ate, last night vomiting became more consistent, c/o abdominal pain and back pain as well, recently admitted for CHF and since he has not been able to keep his medications down he has gain more

## 2018-06-10 ENCOUNTER — Other Ambulatory Visit: Payer: Self-pay

## 2018-06-10 ENCOUNTER — Inpatient Hospital Stay (HOSPITAL_BASED_OUTPATIENT_CLINIC_OR_DEPARTMENT_OTHER)
Admission: EM | Admit: 2018-06-10 | Discharge: 2018-06-14 | DRG: 439 | Disposition: A | Discharge status: Home / Self Care | Payer: Medicare Other | Attending: Internal Medicine | Admitting: Internal Medicine

## 2018-06-10 ENCOUNTER — Emergency Department (HOSPITAL_BASED_OUTPATIENT_CLINIC_OR_DEPARTMENT_OTHER): Payer: Medicare Other

## 2018-06-10 ENCOUNTER — Telehealth: Payer: Self-pay

## 2018-06-10 ENCOUNTER — Encounter (HOSPITAL_BASED_OUTPATIENT_CLINIC_OR_DEPARTMENT_OTHER): Payer: Self-pay | Admitting: Emergency Medicine

## 2018-06-10 DIAGNOSIS — Z9119 Patient's noncompliance with other medical treatment and regimen: Secondary | ICD-10-CM | POA: Diagnosis not present

## 2018-06-10 DIAGNOSIS — T502X5A Adverse effect of carbonic-anhydrase inhibitors, benzothiadiazides and other diuretics, initial encounter: Secondary | ICD-10-CM | POA: Diagnosis present

## 2018-06-10 DIAGNOSIS — K219 Gastro-esophageal reflux disease without esophagitis: Secondary | ICD-10-CM | POA: Diagnosis present

## 2018-06-10 DIAGNOSIS — F1020 Alcohol dependence, uncomplicated: Secondary | ICD-10-CM | POA: Diagnosis present

## 2018-06-10 DIAGNOSIS — G894 Chronic pain syndrome: Secondary | ICD-10-CM | POA: Diagnosis present

## 2018-06-10 DIAGNOSIS — R112 Nausea with vomiting, unspecified: Secondary | ICD-10-CM | POA: Diagnosis not present

## 2018-06-10 DIAGNOSIS — E860 Dehydration: Secondary | ICD-10-CM

## 2018-06-10 DIAGNOSIS — Z7982 Long term (current) use of aspirin: Secondary | ICD-10-CM

## 2018-06-10 DIAGNOSIS — Z79899 Other long term (current) drug therapy: Secondary | ICD-10-CM | POA: Diagnosis not present

## 2018-06-10 DIAGNOSIS — G4733 Obstructive sleep apnea (adult) (pediatric): Secondary | ICD-10-CM | POA: Diagnosis present

## 2018-06-10 DIAGNOSIS — E871 Hypo-osmolality and hyponatremia: Secondary | ICD-10-CM | POA: Diagnosis present

## 2018-06-10 DIAGNOSIS — R109 Unspecified abdominal pain: Secondary | ICD-10-CM | POA: Diagnosis present

## 2018-06-10 DIAGNOSIS — E876 Hypokalemia: Secondary | ICD-10-CM

## 2018-06-10 DIAGNOSIS — Z6841 Body Mass Index (BMI) 40.0 and over, adult: Secondary | ICD-10-CM | POA: Diagnosis not present

## 2018-06-10 DIAGNOSIS — K859 Acute pancreatitis without necrosis or infection, unspecified: Secondary | ICD-10-CM

## 2018-06-10 DIAGNOSIS — Z9884 Bariatric surgery status: Secondary | ICD-10-CM | POA: Diagnosis not present

## 2018-06-10 DIAGNOSIS — Z96652 Presence of left artificial knee joint: Secondary | ICD-10-CM | POA: Diagnosis present

## 2018-06-10 DIAGNOSIS — K852 Alcohol induced acute pancreatitis without necrosis or infection: Secondary | ICD-10-CM | POA: Diagnosis present

## 2018-06-10 DIAGNOSIS — Z1211 Encounter for screening for malignant neoplasm of colon: Secondary | ICD-10-CM | POA: Insufficient documentation

## 2018-06-10 DIAGNOSIS — I5032 Chronic diastolic (congestive) heart failure: Secondary | ICD-10-CM | POA: Diagnosis present

## 2018-06-10 DIAGNOSIS — F17220 Nicotine dependence, chewing tobacco, uncomplicated: Secondary | ICD-10-CM | POA: Diagnosis present

## 2018-06-10 LAB — COMPREHENSIVE METABOLIC PANEL
ALT: 22 U/L (ref 0–44)
AST: 20 U/L (ref 15–41)
Albumin: 4.3 g/dL (ref 3.5–5.0)
Alkaline Phosphatase: 78 U/L (ref 38–126)
Anion gap: 13 (ref 5–15)
BUN: 22 mg/dL — ABNORMAL HIGH (ref 6–20)
CO2: 36 mmol/L — ABNORMAL HIGH (ref 22–32)
Calcium: 9.2 mg/dL (ref 8.9–10.3)
Chloride: 74 mmol/L — ABNORMAL LOW (ref 98–111)
Creatinine, Ser: 1.01 mg/dL (ref 0.61–1.24)
GFR calc Af Amer: >60 mL/min (ref >60)
GFR calc non Af Amer: >60 mL/min (ref >60)
GLUCOSE: 140 mg/dL — AB (ref 70–99)
Potassium: 2 mmol/L — CL (ref 3.5–5.1)
Sodium: 123 mmol/L — ABNORMAL LOW (ref 135–145)
Total Bilirubin: 1.4 mg/dL — ABNORMAL HIGH (ref 0.3–1.2)
Total Protein: 8.7 g/dL — ABNORMAL HIGH (ref 6.5–8.1)

## 2018-06-10 LAB — TROPONIN I: Troponin I: 0.03 ng/mL (ref <0.03)

## 2018-06-10 LAB — CBC WITH DIFFERENTIAL/PLATELET
Abs Immature Granulocytes: 0.04 10*3/uL (ref 0.00–0.07)
BASOS PCT: 0 %
Basophils Absolute: 0 10*3/uL (ref 0.0–0.1)
Eosinophils Absolute: 0 10*3/uL (ref 0.0–0.5)
Eosinophils Relative: 0 %
HCT: 50.9 % (ref 39.0–52.0)
Hemoglobin: 16.5 g/dL (ref 13.0–17.0)
Immature Granulocytes: 0 %
Lymphocytes Relative: 11 %
Lymphs Abs: 1.4 10*3/uL (ref 0.7–4.0)
MCH: 27.2 pg (ref 26.0–34.0)
MCHC: 32.4 g/dL (ref 30.0–36.0)
MCV: 83.9 fL (ref 80.0–100.0)
Monocytes Absolute: 0.8 10*3/uL (ref 0.1–1.0)
Monocytes Relative: 7 %
Neutro Abs: 9.6 10*3/uL — ABNORMAL HIGH (ref 1.7–7.7)
Neutrophils Relative %: 82 %
PLATELETS: 289 10*3/uL (ref 150–400)
RBC: 6.07 MIL/uL — ABNORMAL HIGH (ref 4.22–5.81)
RDW: 13.2 % (ref 11.5–15.5)
WBC: 11.9 10*3/uL — ABNORMAL HIGH (ref 4.0–10.5)
nRBC: 0 % (ref 0.0–0.2)

## 2018-06-10 LAB — BRAIN NATRIURETIC PEPTIDE: B Natriuretic Peptide: 57 pg/mL (ref 0.0–100.0)

## 2018-06-10 LAB — URINALYSIS, ROUTINE W REFLEX MICROSCOPIC
Bilirubin Urine: NEGATIVE
Glucose, UA: NEGATIVE mg/dL
HGB URINE DIPSTICK: NEGATIVE
Ketones, ur: NEGATIVE mg/dL
Leukocytes,Ua: NEGATIVE
Nitrite: NEGATIVE
Protein, ur: NEGATIVE mg/dL
Specific Gravity, Urine: <1.005 — ABNORMAL LOW (ref 1.005–1.030)
pH: 7 (ref 5.0–8.0)

## 2018-06-10 LAB — LIPASE, BLOOD: Lipase: 99 U/L — ABNORMAL HIGH (ref 11–51)

## 2018-06-10 LAB — MAGNESIUM: Magnesium: 2.6 mg/dL — ABNORMAL HIGH (ref 1.7–2.4)

## 2018-06-10 LAB — ETHANOL: Alcohol, Ethyl (B): <10 mg/dL (ref <10)

## 2018-06-10 MED ORDER — MORPHINE SULFATE (PF) 4 MG/ML IV SOLN
4.0000 mg | Freq: Once | INTRAVENOUS | Status: AC
  Administered 2018-06-10: 4 mg via INTRAVENOUS
  Filled 2018-06-10: qty 1

## 2018-06-10 MED ORDER — SODIUM CHLORIDE 0.9% FLUSH: 9.0000 mL | INTRAVENOUS | Status: DC | PRN

## 2018-06-10 MED ORDER — POTASSIUM CHLORIDE 10 MEQ/100ML IV SOLN
10.0000 meq | INTRAVENOUS | Status: AC
  Administered 2018-06-10 – 2018-06-11 (×4): 10 meq via INTRAVENOUS
  Filled 2018-06-10 (×3): qty 100

## 2018-06-10 MED ORDER — PROMETHAZINE HCL 25 MG/ML IJ SOLN
INTRAMUSCULAR | Status: AC
  Filled 2018-06-10: qty 1

## 2018-06-10 MED ORDER — IOHEXOL 300 MG/ML  SOLN
100.0000 mL | Freq: Once | INTRAMUSCULAR | Status: AC | PRN
  Administered 2018-06-10: 100 mL via INTRAVENOUS

## 2018-06-10 MED ORDER — POTASSIUM CHLORIDE 10 MEQ/100ML IV SOLN
10.0000 meq | INTRAVENOUS | Status: AC
  Administered 2018-06-10 (×2): 10 meq via INTRAVENOUS
  Filled 2018-06-10 (×2): qty 100

## 2018-06-10 MED ORDER — ENOXAPARIN SODIUM 40 MG/0.4ML ~~LOC~~ SOLN
40.0000 mg | SUBCUTANEOUS | Status: DC
  Administered 2018-06-11 – 2018-06-14 (×4): 40 mg via SUBCUTANEOUS
  Filled 2018-06-10 (×4): qty 0.4

## 2018-06-10 MED ORDER — HYDROMORPHONE 1 MG/ML IV SOLN: INTRAVENOUS | Status: DC

## 2018-06-10 MED ORDER — ONDANSETRON HCL 4 MG/2ML IJ SOLN
4.0000 mg | Freq: Four times a day (QID) | INTRAMUSCULAR | Status: DC | PRN
  Administered 2018-06-10 – 2018-06-12 (×5): 4 mg via INTRAVENOUS
  Filled 2018-06-10 (×5): qty 2

## 2018-06-10 MED ORDER — PROMETHAZINE HCL 25 MG/ML IJ SOLN
12.5000 mg | Freq: Four times a day (QID) | INTRAMUSCULAR | Status: DC | PRN
  Administered 2018-06-11 – 2018-06-12 (×2): 12.5 mg via INTRAVENOUS
  Filled 2018-06-10 (×2): qty 1

## 2018-06-10 MED ORDER — PANTOPRAZOLE SODIUM 40 MG IV SOLR
40.0000 mg | INTRAVENOUS | Status: DC
  Administered 2018-06-11: 40 mg via INTRAVENOUS
  Filled 2018-06-10: qty 40

## 2018-06-10 MED ORDER — PROMETHAZINE HCL 25 MG/ML IJ SOLN
12.5000 mg | Freq: Once | INTRAMUSCULAR | Status: AC
  Administered 2018-06-10: 12.5 mg via INTRAVENOUS

## 2018-06-10 MED ORDER — SODIUM CHLORIDE 0.9 % IV BOLUS
500.0000 mL | Freq: Once | INTRAVENOUS | Status: AC
  Administered 2018-06-10: 500 mL via INTRAVENOUS

## 2018-06-10 MED ORDER — DIPHENHYDRAMINE HCL 50 MG/ML IJ SOLN: 12.5000 mg | Freq: Four times a day (QID) | INTRAMUSCULAR | Status: DC | PRN

## 2018-06-10 MED ORDER — SODIUM CHLORIDE 0.9 % IV SOLN
Freq: Once | INTRAVENOUS | Status: AC
  Administered 2018-06-10: 14:00:00 via INTRAVENOUS

## 2018-06-10 MED ORDER — HYDROMORPHONE HCL 1 MG/ML IJ SOLN
0.5000 mg | INTRAMUSCULAR | Status: DC | PRN
  Administered 2018-06-10 – 2018-06-11 (×3): 2 mg via INTRAVENOUS
  Filled 2018-06-10 (×3): qty 2

## 2018-06-10 MED ORDER — LACTATED RINGERS IV SOLN
INTRAVENOUS | Status: DC
  Administered 2018-06-11 (×2): via INTRAVENOUS

## 2018-06-10 MED ORDER — ONDANSETRON HCL 4 MG/2ML IJ SOLN
4.0000 mg | Freq: Once | INTRAMUSCULAR | Status: AC
  Administered 2018-06-10: 4 mg via INTRAVENOUS
  Filled 2018-06-10: qty 2

## 2018-06-10 MED ORDER — NALOXONE HCL 0.4 MG/ML IJ SOLN: 0.4000 mg | INTRAMUSCULAR | Status: DC | PRN

## 2018-06-10 MED ORDER — DIPHENHYDRAMINE HCL 12.5 MG/5ML PO ELIX: 12.5000 mg | ORAL_SOLUTION | Freq: Four times a day (QID) | ORAL | Status: DC | PRN

## 2018-06-10 NOTE — ED Provider Notes (Signed)
[facility] EMERGENCY DEPARTMENT Provider Note   CSN: [id number] Arrival date & time: 06/10/18  7564    History   Chief Complaint Chief Complaint  Patient presents with  . Emesis  . Abdominal Pain    HPI WINCE STENHOUSE is a 53 y.o. male.     Pt presents to the ED today with n/v and abdominal pain.  Sx have been going on for the past few days.  He went to [facility] ED last night, but there were still 20 people in front of him after 3 hrs of waiting, so he went home.  His pcp called in phenergan suppositories which he took, but they did not help.  The pt has not been able to keep down his meds.  He was admitted last week with a new onset CHF (d/c on 2/20).  No f/c.     Past Medical History:  Diagnosis Date  . Arthritis   . Asthma    as child  . Back pain   . CHF (congestive heart failure) (HCC)   . GERD (gastroesophageal reflux disease)   . Hernia   . History of hiatal hernia   . Hypogonadism in male   . Neuromuscular disorder (HCC)    back injury with surgery  . PONV (postoperative nausea and vomiting)    gets nausea  . Shortness of breath dyspnea   . Sleep apnea    Does not use CPAP, new equipment 12/2014  . Ulcer   . Wears glasses     Patient Active Problem List   Diagnosis Date Noted  . Acute pancreatitis 06/10/2018  . Acute CHF (congestive heart failure) (HCC) 05/29/2018  . Withdrawal from opioids (HCC) 05/27/2018  . Morbid obesity with BMI of 50.0-59.9, adult (HCC) 05/25/2018  . Secondary osteoarthritis of multiple sites 05/25/2018  . Osteoarthritis of hip (Right) 05/25/2018  . Back pain   . Encounter for adjustment or management of infusion pump 04/01/2018  . Lumbar spondylosis 01/26/2018  . Spondylosis without myelopathy or radiculopathy, lumbosacral region 12/02/2017  . Lumbar facet syndrome (Bilateral) (R>L) 12/02/2017  . Chronic low back pain (Primary Area of Pain) (Bilateral) (R>L) 12/02/2017  . Chronic knee pain after total replacement  (Left) 12/01/2017  . Chronic musculoskeletal pain 11/17/2017  . Muscle spasticity 11/17/2017  . Gastroesophageal reflux disease without esophagitis 11/17/2017  . Neurogenic pain 11/17/2017  . Drug-induced nausea and vomiting 11/17/2017  . Therapeutic opioid-induced constipation (OIC) 11/17/2017  . Chronic pain syndrome 11/09/2017  . Long term prescription benzodiazepine use 11/09/2017  . Long term prescription opiate use 11/09/2017  . Opiate use (> 150 MME/day) 11/09/2017  . Long-term use of high-risk medication 11/09/2017  . Pharmacologic therapy 11/09/2017  . Disorder of skeletal system 11/09/2017  . Problems influencing health status 11/09/2017  . Chronic nausea s/p bariatric surgery 11/09/2017  . Presence of intrathecal pump 11/09/2017  . Encounter for interrogation of infusion pump 11/09/2017  . Chronic foot pain (Right) 11/09/2017  . Chronic hip pain (Right) 11/09/2017  . Old tear of medial meniscus of knee (Left) 11/09/2017  . Failed back surgical syndrome 11/09/2017  . Chronic low back pain (Bilateral) w/ sciatica (Bilateral) 11/09/2017  . Chronic lower extremity pain (Bilateral) (L>R) 11/09/2017  . Chronic lower extremity radicular pain 11/09/2017  . Chronic shoulder pain (Bilateral) 11/09/2017  . Hx of total knee replacement (Left) 11/09/2017  . History of arthroscopic surgery of shoulder (Bilateral) 11/09/2017  . S/P laparoscopic sleeve gastrectomy Sept 2016 01/08/2015  . Severe  obesity (BMI >= 40) (HCC) 10/21/2014  . OSA complicated by mild polycythemia  10/21/2014  . Polycythemia, secondary 10/21/2014  . Dyspnea 10/20/2014  . S/P rotator cuff repair (Bilateral) 08/18/2013  . Chronic knee pain (Left) 02/28/2008  . DDD (degenerative disc disease), lumbosacral 10/20/2006    Past Surgical History:  Procedure Laterality Date  . BACK SURGERY    . JOINT REPLACEMENT     Left knee replacement  . LAPAROSCOPIC GASTRIC SLEEVE RESECTION N/A 01/08/2015   Procedure:  LAPAROSCOPIC GASTRIC SLEEVE RESECTION;  Surgeon: Luretha Murphy, MD;  Location: [facility];  Service: General;  Laterality: N/A;  . morphine pump    . SHOULDER ARTHROSCOPY WITH SUBACROMIAL DECOMPRESSION AND BICEP TENDON REPAIR Left 10/06/2013   Procedure: LEFT SHOULDER ARTHROSCOPY DEBRIDEMENT EXTENTSIVE,DISTAL CLAVICULECTOMY,DECOMPRESSION SUBACROMIAL PARTIAL ACROMIOPLASTY WITH ROTATOR CUFF REPAIR, and excision of CALCIUM DEPOSIT;  Surgeon: Loreta Ave, MD;  Location: [facility];  Service: Orthopedics;  Laterality: Left;  . SHOULDER ARTHROSCOPY WITH SUBACROMIAL DECOMPRESSION, ROTATOR CUFF REPAIR AND BICEP TENDON REPAIR Right 08/18/2013   Procedure: RIGHT SHOULDER ARTHROSCOPY WITH SUBACROMIAL DECOMPRESSION/PARTIAL ACROMIOPLASTY WITH CORACOACROMIAL RELEASE/DISTAL CLAVICULECTOMY/ ROTATOR CUFF REPAIR/DEBRIDEMENT EXTENTSIVE;  Surgeon: Loreta Ave, MD;  Location: [facility];  Service: Orthopedics;  Laterality: Right;  ANESTHESIA: GENERAL, PRE/POST OP SCALENE  . TONSILLECTOMY          Home Medications    Prior to Admission medications   Medication Sig Start Date End Date Taking? Authorizing Provider  aspirin EC 81 MG EC tablet Take 1 tablet (81 mg total) by mouth daily. 06/04/18   Purohit, Salli Quarry, MD  baclofen (LIORESAL) 10 MG tablet Take 1 tablet (10 mg total) by mouth 4 (four) times daily. 06/30/18 08/29/18  [name], MD  bumetanide (BUMEX) 1 MG tablet Take 1 tablet (1 mg total) by mouth 2 (two) times daily for 30 days. 06/08/18 07/08/18  Parke Poisson, MD  cloNIDine (CATAPRES - DOSED IN MG/24 HR) 0.1 mg/24hr patch Place 1 patch (0.1 mg total) onto the skin once a week for 14 days. Discontinue if blood pressure is too low. 05/27/18 06/10/18  [name], MD  esomeprazole (NEXIUM) 40 MG capsule Take 1 capsule (40 mg total) by mouth 2 (two) times daily. 06/30/18 08/29/18  [name], MD  Gabapentin Enacarbil (HORIZANT) 600 MG TBCR Take 1 tablet (600  mg total) by mouth 2 (two) times daily. 06/30/18 08/29/18  [name], MD  GNP MILK OF MAGNESIA 1200 MG/15ML suspension Take 5 mLs by mouth daily as needed for mild constipation.  05/05/18   [provider]  hydrOXYzine (ATARAX/VISTARIL) 25 MG tablet TAKE ONE TABLET BY MOUTH EVERY 8 HOURS AS NEEDED FOR ITCHING 06/03/18   [provider]  Magnesium Oxide 500 MG CAPS Take 1 capsule (500 mg total) by mouth 2 (two) times daily at 8 am and 10 pm. 06/30/18 08/29/18  [name], MD  NON FORMULARY 959 mcg by Intrathecal route daily. IT pump Fentanyl 2,000.0 mcg/ml Bupivicaine 20.0 mg/ml 40 ml pump    [provider]  ondansetron (ZOFRAN-ODT) 8 MG disintegrating tablet Take 1 tablet (8 mg total) by mouth every 8 (eight) hours as needed for nausea or vomiting. 06/30/18 08/29/18  [name], MD  Oxycodone HCl 10 MG TABS Take 1 tablet (10 mg total) by mouth every 6 (six) hours as needed. Must last 30 days. Max: 4/day. Patient taking differently: Take 10 mg by mouth every 6 (six) hours as needed (pain). Must last 30 days. Max:  4/day. 05/31/18 06/30/18  [name], MD  Oxycodone HCl 10 MG TABS Take 10 mg by mouth every 6 (six) hours as needed. 06/03/18 07/05/18  [name], MD  polycarbophil (FIBERCON) 625 MG tablet Take 625 mg by mouth 3 (three) times daily.     [provider]  PRESCRIPTION MEDICATION Intrathecal pump Fentanyl 2,000.0 mcg/ml Bupivicaine 20.0 mg/ml 1,056.1 mcg/day    [provider]  promethazine (PHENERGAN) 25 MG tablet Take 25 mg by mouth every 8 (eight) hours. 05/12/18   [provider]    Family History Family History  Problem Relation Age of Onset  . Diabetes Mother   . Pneumonia Father     Social History Social History   Tobacco Use  . Smoking status: Never Smoker  . Smokeless tobacco: Current User    Types: Snuff  Substance Use Topics  . Alcohol use: No  . Drug use: No     Allergies     Tape   Review of Systems Review of Systems  Gastrointestinal: Positive for abdominal pain, nausea and vomiting.  All other systems reviewed and are negative.    Physical Exam Updated Vital Signs BP 109/74   Pulse 74   Resp 19   Ht 5' 10.5" (1.791 m)   Wt (!) 171.9 kg   SpO2 98%   BMI 53.61 kg/m   Physical Exam Vitals signs and nursing note reviewed.  Constitutional:      Appearance: He is well-developed. He is obese.  HENT:     Head: Normocephalic and atraumatic.     Mouth/Throat:     Mouth: Mucous membranes are dry.     Pharynx: Oropharynx is clear.  Eyes:     Extraocular Movements: Extraocular movements intact.     Pupils: Pupils are equal, round, and reactive to light.  Cardiovascular:     Rate and Rhythm: Normal rate and regular rhythm.  Pulmonary:     Effort: Pulmonary effort is normal.     Breath sounds: Normal breath sounds.  Abdominal:     General: Abdomen is flat. Bowel sounds are normal.     Palpations: Abdomen is soft.     Tenderness: There is abdominal tenderness in the epigastric area.  Skin:    General: Skin is warm and dry.     Capillary Refill: Capillary refill takes less than 2 seconds.  Neurological:     General: No focal deficit present.     Mental Status: He is alert and oriented to person, place, and time.  Psychiatric:        Mood and Affect: Mood normal.        Behavior: Behavior normal.      ED Treatments / Results  Labs (all labs ordered are listed, but only abnormal results are displayed) Labs Reviewed  CBC WITH DIFFERENTIAL/PLATELET - Abnormal; Notable for the following components:      Result Value   WBC 11.9 (*)    RBC 6.07 (*)    Neutro Abs 9.6 (*)    All other components within normal limits  URINALYSIS, ROUTINE W REFLEX MICROSCOPIC - Abnormal; Notable for the following components:   Specific Gravity, Urine <1.005 (*)    All other components within normal limits  TROPONIN I - Abnormal; Notable for the following  components:   Troponin I 0.03 (*)    All other components within normal limits  COMPREHENSIVE METABOLIC PANEL - Abnormal; Notable for the following components:   Sodium 123 (*)    Potassium  2.0 (*)    Chloride 74 (*)    CO2 36 (*)    Glucose, Bld 140 (*)    BUN 22 (*)    Total Protein 8.7 (*)    Total Bilirubin 1.4 (*)    All other components within normal limits  LIPASE, BLOOD - Abnormal; Notable for the following components:   Lipase 99 (*)    All other components within normal limits  MAGNESIUM - Abnormal; Notable for the following components:   Magnesium 2.6 (*)    All other components within normal limits  BRAIN NATRIURETIC PEPTIDE  POC OCCULT BLOOD, ED    EKG EKG Interpretation  Date/Time:  Thursday June 10 2018 11:13:40 EST Ventricular Rate:  68 PR Interval:    QRS Duration: 124 QT Interval:  567 QTC Calculation: 604 R Axis:   65 Text Interpretation:  Sinus rhythm Nonspecific intraventricular conduction delay Inferior infarct, old No significant change since last tracing Confirmed by Jacalyn LefevreHaviland, Kienna Moncada [phone]([id number]) on 06/10/2018 11:22:24 AM   Radiology Dg Chest 2 View  Result Date: 06/10/2018 CLINICAL DATA:  Vomiting.  Epigastric pain. EXAM: CHEST - 2 VIEW COMPARISON:  01/04/2015. FINDINGS: Stable cardiomegaly and pulmonary venous congestion. Low lung volumes. No focal alveolar infiltrate. No pleural effusion or pneumothorax. No acute bony abnormality. IMPRESSION: Stable cardiomegaly with pulmonary venous congestion. Low lung volumes. Electronically Signed   By: Maisie Fushomas  Register   On: 06/10/2018 11:20   Ct Abdomen Pelvis W Contrast  Result Date: 06/10/2018 CLINICAL DATA:  Nausea and vomiting EXAM: CT ABDOMEN AND PELVIS WITH CONTRAST TECHNIQUE: Multidetector CT imaging of the abdomen and pelvis was performed using the standard protocol following bolus administration of intravenous contrast. CONTRAST:  100mL OMNIPAQUE 300 COMPARISON:  None. FINDINGS: Lower chest: No acute  abnormality. Hepatobiliary: Diffuse fatty infiltration of the liver is noted. The gallbladder is within normal limits. Pancreas: Pancreas is well visualized with mild peripancreatic inflammatory changes consistent with acute pancreatitis. No significant phlegmon is noted at this time. Spleen: Spleen is within normal limits. Adrenals/Urinary Tract: Adrenal glands are within normal limits. Kidneys are well visualized with a normal enhancement pattern. No calculi or obstructive changes are noted. The bladder is partially distended. Stomach/Bowel: Changes consistent with prior gastric sleeve surgery are noted. No obstructive or inflammatory changes of the small bowel or colon are noted. The appendix is within normal limits. Vascular/Lymphatic: No significant vascular findings are present. No enlarged abdominal or pelvic lymph nodes. Reproductive: Prostate is unremarkable. Other: No abdominal wall hernia or abnormality. No abdominopelvic ascites. Musculoskeletal: Postsurgical changes in the lumbar spine are noted. A infusion pump is noted extending into the thoracic spine. IMPRESSION: Changes consistent with early acute pancreatitis. No pseudocyst or significant phlegmon is noted at this time. Electronically Signed   By: Alcide CleverMark  Lukens M.D.   On: 06/10/2018 11:23    Procedures Procedures (including critical care time)  Medications Ordered in ED Medications  potassium chloride 10 mEq in 100 mL IVPB ( Intravenous Stopped 06/10/18 1416)  morphine 4 MG/ML injection 4 mg (4 mg Intravenous Given 06/10/18 1033)  ondansetron (ZOFRAN) injection 4 mg (4 mg Intravenous Given 06/10/18 1033)  iohexol (OMNIPAQUE) 300 MG/ML solution 100 mL (100 mLs Intravenous Contrast Given 06/10/18 1042)  morphine 4 MG/ML injection 4 mg (4 mg Intravenous Given 06/10/18 1252)  sodium chloride 0.9 % bolus 500 mL (0 mLs Intravenous Stopped 06/10/18 1346)  promethazine (PHENERGAN) injection 12.5 mg (12.5 mg Intravenous Given 06/10/18 1338)  0.9 %   sodium chloride infusion (  Intravenous New Bag/Given 06/10/18 1347)     Initial Impression / Assessment and Plan / ED Course  I have reviewed the triage vital signs and the nursing notes.  Pertinent labs & imaging results that were available during my care of the patient were reviewed by me and considered in my medical decision making (see chart for details).       Pt has evidence of acute pancreatitis.  He has never had this in the past.  His gallbladder looked ok on CT.  He does not usually drink alcohol, but admits to drinking some to help with the pain.    Pt does have hyponatremia and hypokalemia.  Mg is nl.  This is likely due to large volume diuresis.  He was gently hydrated with 500 cc of NS.  Potassium was replaced as well.    Pt did require 2L oxygen after pain meds.  O2 sats dropped to the upper 80s.  They are mid 90s on 2L.  Pt d/w Dr. Allena Katz (triad) for admission.  Final Clinical Impressions(s) / ED Diagnoses   Final diagnoses:  Acute pancreatitis without infection or necrosis, unspecified pancreatitis type  Dehydration  Non-intractable vomiting with nausea, unspecified vomiting type  Hypokalemia  Hyponatremia    ED Discharge Orders    None       Jacalyn Lefevre, MD 06/10/18 1422

## 2018-06-10 NOTE — ED Triage Notes (Signed)
Pt c/o vomiting x 7d; epigastric abd pain since Tue; sts can't keep water and meds down; went to [facility] last night, but did not stay bc of wait

## 2018-06-10 NOTE — H&P (Signed)
History and Physical    PLEASE NOTE THAT DRAGON DICTATION SOFTWARE WAS USED IN THE CONSTRUCTION OF THIS NOTE.   [name]:[id number] DOB: April 24, 1965 DOA: 06/10/2018  PCP: [name], MD Patient coming from: transfer [facility] ED  I have personally briefly reviewed patient's old medical records in [facility] Link  Chief Complaint: Abdominal pain  HPI: [name] is a 53 y.o. male with medical history significant for chronic diastolic heart failure, chronic pain syndrome on intrathecal fentanyl/bupivacaine pump, morbid obesity, GERD, who is admitted to [facility] long hospital on 06/10/2018 as transfer from [facility] ED with acute pancreatitis after presenting to the latter facility complaining of abdominal pain.  The patient reports 2 to 3 days of constant, sharp, abdominal pain, was prominently over the left upper quadrant as well as epigastrium.  Denies any radiation from this location.  He reports exacerbation with attempts to eat or drink.  Denies any recent trauma.  Reports associated nausea resulting in 3-4 daily episodes of nonbloody, nonbilious emesis over the last 2 to 3 days.  He also notes 1-2 episodes of loose stool that timeframe, which is new for him, and reports that 1 of these episodes was associated with a small amount of hematochezia, although this is subsequently resolved.  Not associate with any subjective fever, chills, rigors, generalized myalgias, or rash.  No recent travel.  The patient denies any prior episodes of acute pancreatitis.  He has been following with [facility] in the setting of chronic back pain.  He reports that the pain center has been gradually weaning him off his opioids starting in October 2019.  Over the course of this weaning timeframe, the patient reports periods of poor pain control, prompting him to start consuming beer in October 2018, relative to no prior history of alcohol consumption.  Since that time,  the patient reports that he typically consumes between 2-7 beers on a daily basis, and reports that his most recent alcohol consumption occurred 3 days ago, just prior to the onset of his presenting abdominal discomfort.  He also notes a history of gastric ulcer several years ago.  Denies NSAID use.   Of note, the patient reports that the fentanyl-bupivacaine that he receives via the intrathecal pump is administered at a constant basal rate, and that he has no ability to turn off the pump, nor does he have any ability to bolus the dose of these medications.  He reports that all adjustments to the basal rate occur through [facility].   Of note, the patient was recently hospitalized in the [facility] system for acute on chronic diastolic heart failure from 05/29/2018 to 06/03/2018 after presenting with shortness of breath, worsening of edema in the bilateral lower extremities, with chest x-ray showing evidence of pulmonary edema.  Leading up to that hospitalization, the patient reports that his diuretic regimen was limited to Lasix 40 mg p.o. daily as needed for worsening peripheral edema.  He was also started HCTZ two days prior to that hospitalization.  On 05/30/2018, echocardiogram showed LVEF 60 to 65%, evidence of diastolic dysfunction, mildly dilated left atrium, and no evidence of significant valvular pathology.  Initially treated with IV Lasix, he was subsequently transitioned to p.o. Bumex, upon which he was discharged 1 mg p.o. twice daily.  He confirms that he is no longer on HCTZ or Lasix following hospital discharge. Otherwise, the patient denies any recent changes to his home medication regimen, and  denies any recent use of antibiotics.  Since 05/29/2018, the patient reports that he has lost approximately 50 pounds through today.    [facility] ED Course: Vital signs were notable for the following: Temperature max 98.9; heart rate 74-96; initial blood pressure 96/74, which  increased to 135/80 following interval IV fluids, as further described below; respiratory rate 15-22, and oxygen saturation 95 to 96% on room air.  Labs performed at [facility] ED notable for the following: CMP was notable for sodium 123, compared to 138 on 06/03/2018, potassium 2.0, chloride 74 compared to 95 on 06/03/2018, bicarbonate 36, creatinine 1.01, calcium 9.2, albumin 4.3, alkaline phosphatase 70, AST 20, ALT 22, total bilirubin 1.4.  Lipase 99.  Magnesium 2.6.  CT abdomen/pelvis with contrast, per final radiology report showed peripancreatic inflammatory changes consistent with acute pancreatitis in the absence of any evidence of pancreatic cyst or necrosis.  Otherwise, CT abdomen/pelvis showed no evidence of acute intra-abdominal process, including no evidence of gallbladder inflammation, biliary dilation, choledocholithiasis, bowel obstruction, abscess, or perforation.  While at [facility] ED, the following were administered: Morphine 4 mg IV x3, potassium chloride 10 mill equivalents IV over 4 hours x 1, Phenergan 12.5 mg IV x1, and a 500 cc IV normal saline bolus.  The patient was subsequently transferred to [facility] for further evaluation and management of suspected acute pancreatitis.    Review of Systems: As per HPI otherwise 10 point review of systems negative.   Past Medical History:  Diagnosis Date  . Arthritis   . Asthma    as child  . Back pain   . CHF (congestive heart failure) (HCC)   . GERD (gastroesophageal reflux disease)   . Hernia   . History of hiatal hernia   . Hypogonadism in male   . Neuromuscular disorder (HCC)    back injury with surgery  . PONV (postoperative nausea and vomiting)    gets nausea  . Shortness of breath dyspnea   . Sleep apnea    Does not use CPAP, new equipment 12/2014  . Ulcer   . Wears glasses     Past Surgical History:  Procedure Laterality Date  . BACK SURGERY    . JOINT REPLACEMENT     Left knee replacement  . LAPAROSCOPIC  GASTRIC SLEEVE RESECTION N/A 01/08/2015   Procedure: LAPAROSCOPIC GASTRIC SLEEVE RESECTION;  Surgeon: Luretha Murphy, MD;  Location: [facility];  Service: General;  Laterality: N/A;  . morphine pump    . SHOULDER ARTHROSCOPY WITH SUBACROMIAL DECOMPRESSION AND BICEP TENDON REPAIR Left 10/06/2013   Procedure: LEFT SHOULDER ARTHROSCOPY DEBRIDEMENT EXTENTSIVE,DISTAL CLAVICULECTOMY,DECOMPRESSION SUBACROMIAL PARTIAL ACROMIOPLASTY WITH ROTATOR CUFF REPAIR, and excision of CALCIUM DEPOSIT;  Surgeon: Loreta Ave, MD;  Location: [facility];  Service: Orthopedics;  Laterality: Left;  . SHOULDER ARTHROSCOPY WITH SUBACROMIAL DECOMPRESSION, ROTATOR CUFF REPAIR AND BICEP TENDON REPAIR Right 08/18/2013   Procedure: RIGHT SHOULDER ARTHROSCOPY WITH SUBACROMIAL DECOMPRESSION/PARTIAL ACROMIOPLASTY WITH CORACOACROMIAL RELEASE/DISTAL CLAVICULECTOMY/ ROTATOR CUFF REPAIR/DEBRIDEMENT EXTENTSIVE;  Surgeon: Loreta Ave, MD;  Location: [facility];  Service: Orthopedics;  Laterality: Right;  ANESTHESIA: GENERAL, PRE/POST OP SCALENE  . TONSILLECTOMY      Social History:  reports that he has never smoked. His smokeless tobacco use includes snuff. He reports current alcohol use. He reports that he does not use drugs.   Allergies  Allergen Reactions  . Tape Other (See Comments)    Blisters     Family History  Problem Relation Age of  Onset  . Diabetes Mother   . Pneumonia Father      Prior to Admission medications   Medication Sig Start Date End Date Taking? Authorizing Provider  aspirin EC 81 MG EC tablet Take 1 tablet (81 mg total) by mouth daily. 06/04/18   Purohit, Salli Quarry, MD  baclofen (LIORESAL) 10 MG tablet Take 1 tablet (10 mg total) by mouth 4 (four) times daily. 06/30/18 08/29/18  [name], MD  bumetanide (BUMEX) 1 MG tablet Take 1 tablet (1 mg total) by mouth 2 (two) times daily for 30 days. 06/08/18 07/08/18  Parke Poisson, MD  cloNIDine (CATAPRES - DOSED IN MG/24  HR) 0.1 mg/24hr patch Place 1 patch (0.1 mg total) onto the skin once a week for 14 days. Discontinue if blood pressure is too low. 05/27/18 06/10/18  [name], MD  esomeprazole (NEXIUM) 40 MG capsule Take 1 capsule (40 mg total) by mouth 2 (two) times daily. 06/30/18 08/29/18  [name], MD  Gabapentin Enacarbil (HORIZANT) 600 MG TBCR Take 1 tablet (600 mg total) by mouth 2 (two) times daily. 06/30/18 08/29/18  [name], MD  GNP MILK OF MAGNESIA 1200 MG/15ML suspension Take 5 mLs by mouth daily as needed for mild constipation.  05/05/18   [provider]  hydrOXYzine (ATARAX/VISTARIL) 25 MG tablet TAKE ONE TABLET BY MOUTH EVERY 8 HOURS AS NEEDED FOR ITCHING 06/03/18   [provider]  Magnesium Oxide 500 MG CAPS Take 1 capsule (500 mg total) by mouth 2 (two) times daily at 8 am and 10 pm. 06/30/18 08/29/18  [name], MD  NON FORMULARY 959 mcg by Intrathecal route daily. IT pump Fentanyl 2,000.0 mcg/ml Bupivicaine 20.0 mg/ml 40 ml pump    [provider]  ondansetron (ZOFRAN-ODT) 8 MG disintegrating tablet Take 1 tablet (8 mg total) by mouth every 8 (eight) hours as needed for nausea or vomiting. 06/30/18 08/29/18  [name], MD  Oxycodone HCl 10 MG TABS Take 1 tablet (10 mg total) by mouth every 6 (six) hours as needed. Must last 30 days. Max: 4/day. Patient taking differently: Take 10 mg by mouth every 6 (six) hours as needed (pain). Must last 30 days. Max: 4/day. 05/31/18 06/30/18  [name], MD  Oxycodone HCl 10 MG TABS Take 10 mg by mouth every 6 (six) hours as needed. 06/03/18 07/05/18  [name], MD  polycarbophil (FIBERCON) 625 MG tablet Take 625 mg by mouth 3 (three) times daily.     [provider]  PRESCRIPTION MEDICATION Intrathecal pump Fentanyl 2,000.0 mcg/ml Bupivicaine 20.0 mg/ml 1,056.1 mcg/day    [provider]  promethazine (PHENERGAN) 25 MG tablet Take 25 mg by mouth every 8 (eight)  hours. 05/12/18   [provider]     Objective     Physical Exam: Vitals:   06/10/18 1400 06/10/18 1430 06/10/18 1544 06/10/18 1833  BP: 107/69 112/61 99/70 135/81  Pulse: (!) 55 69 89 76  Resp: 12 14 (!) 22 20  Temp:    98.9 F (37.2 C)  TempSrc:    Oral  SpO2: 100% 98% 100% 96%  Weight:      Height:        General: appears to be stated age; alert, oriented Skin: warm, dry, no rash Head:  AT/[place] Mouth:  Oral mucosa membranes appears dry, normal dentition Neck: supple; trachea midline Heart:  RRR; did not appreciate any M/R/G Lungs: CTAB, did not appreciate any wheezes, rales, or rhonchi Abdomen: + BS; soft, ND,  tenderness to palpation over the left upper quadrant and epigastrium, without evidence of associated guarding, rigidity, or rebound tenderness. Extremities: trace edema in b/l LE's. no muscle wasting   Labs on Admission: I have personally reviewed following labs and imaging studies  CBC: Recent Labs  Lab 06/09/18 1733 06/10/18 1038  WBC 11.7* 11.9*  NEUTROABS  --  9.6*  HGB 16.5 16.5  HCT 50.6 50.9  MCV 83.2 83.9  PLT 298 289   Basic Metabolic Panel: Recent Labs  Lab 06/09/18 1733 06/10/18 1200  NA 124* 123*  K 2.2* 2.0*  CL 74* 74*  CO2 34* 36*  GLUCOSE 148* 140*  BUN 16 22*  CREATININE 0.98 1.01  CALCIUM 9.6 9.2  MG  --  2.6*   GFR: Estimated Creatinine Clearance: 138.7 mL/min (by C-G formula based on SCr of 1.01 mg/dL). Liver Function Tests: Recent Labs  Lab 06/09/18 1733 06/10/18 1200  AST 25 20  ALT 26 22  ALKPHOS 74 78  BILITOT 1.2 1.4*  PROT 8.7* 8.7*  ALBUMIN 4.4 4.3   Recent Labs  Lab 06/09/18 1733 06/10/18 1200  LIPASE 141* 99*   No results for input(s): AMMONIA in the last 168 hours. Coagulation Profile: No results for input(s): INR, PROTIME in the last 168 hours. Cardiac Enzymes: Recent Labs  Lab 06/10/18 1038  TROPONINI 0.03*   BNP (last 3 results) No results for input(s): PROBNP in the last 8760  hours. HbA1C: No results for input(s): HGBA1C in the last 72 hours. CBG: No results for input(s): GLUCAP in the last 168 hours. Lipid Profile: No results for input(s): CHOL, HDL, LDLCALC, TRIG, CHOLHDL, LDLDIRECT in the last 72 hours. Thyroid Function Tests: No results for input(s): TSH, T4TOTAL, FREET4, T3FREE, THYROIDAB in the last 72 hours. Anemia Panel: No results for input(s): VITAMINB12, FOLATE, FERRITIN, TIBC, IRON, RETICCTPCT in the last 72 hours. Urine analysis:    Component Value Date/Time   COLORURINE YELLOW 06/10/2018 1200   APPEARANCEUR CLEAR 06/10/2018 1200   LABSPEC <1.005 (L) 06/10/2018 1200   PHURINE 7.0 06/10/2018 1200   GLUCOSEU NEGATIVE 06/10/2018 1200   HGBUR NEGATIVE 06/10/2018 1200   BILIRUBINUR NEGATIVE 06/10/2018 1200   KETONESUR NEGATIVE 06/10/2018 1200   PROTEINUR NEGATIVE 06/10/2018 1200   UROBILINOGEN 0.2 08/13/2010 1206   NITRITE NEGATIVE 06/10/2018 1200   LEUKOCYTESUR NEGATIVE 06/10/2018 1200    Radiological Exams on Admission: Dg Chest 2 View  Result Date: 06/10/2018 CLINICAL DATA:  Vomiting.  Epigastric pain. EXAM: CHEST - 2 VIEW COMPARISON:  01/04/2015. FINDINGS: Stable cardiomegaly and pulmonary venous congestion. Low lung volumes. No focal alveolar infiltrate. No pleural effusion or pneumothorax. No acute bony abnormality. IMPRESSION: Stable cardiomegaly with pulmonary venous congestion. Low lung volumes. Electronically Signed   By: Maisie Fus  Register   On: 06/10/2018 11:20   Ct Abdomen Pelvis W Contrast  Result Date: 06/10/2018 CLINICAL DATA:  Nausea and vomiting EXAM: CT ABDOMEN AND PELVIS WITH CONTRAST TECHNIQUE: Multidetector CT imaging of the abdomen and pelvis was performed using the standard protocol following bolus administration of intravenous contrast. CONTRAST:  OMNIPAQUE 300 COMPARISON:  None. FINDINGS: Lower chest: No acute abnormality. Hepatobiliary: Diffuse fatty infiltration of the liver is noted. The gallbladder is within  normal limits. Pancreas: Pancreas is well visualized with mild peripancreatic inflammatory changes consistent with acute pancreatitis. No significant phlegmon is noted at this time. Spleen: Spleen is within normal limits. Adrenals/Urinary Tract: Adrenal glands are within normal limits. Kidneys are well visualized with a normal enhancement pattern. No calculi  or obstructive changes are noted. The bladder is partially distended. Stomach/Bowel: Changes consistent with prior gastric sleeve surgery are noted. No obstructive or inflammatory changes of the small bowel or colon are noted. The appendix is within normal limits. Vascular/Lymphatic: No significant vascular findings are present. No enlarged abdominal or pelvic lymph nodes. Reproductive: Prostate is unremarkable. Other: No abdominal wall hernia or abnormality. No abdominopelvic ascites. Musculoskeletal: Postsurgical changes in the lumbar spine are noted. A infusion pump is noted extending into the thoracic spine. IMPRESSION: Changes consistent with early acute pancreatitis. No pseudocyst or significant phlegmon is noted at this time. Electronically Signed   By: Alcide Clever M.D.   On: 06/10/2018 11:23     Assessment/Plan   YIOVANNI GARTMAN is a 53 y.o. male with medical history significant for chronic diastolic heart failure, chronic pain syndrome on intrathecal fentanyl/bupivacaine pump, morbid obesity, GERD, who is admitted to [facility] hospital on 06/10/2018 as transfer from [facility] ED with acute pancreatitis after presenting to the latter facility complaining of abdominal pain.   Principal Problem:   Acute alcoholic pancreatitis Active Problems:   Gastroesophageal reflux disease without esophagitis   Hypokalemia   Hyponatremia   Chronic diastolic CHF (congestive heart failure) (HCC)   #) Acute alcoholic pancreatitis: Diagnosis on the basis of 2 to 3 days of progressive epigastric/left upper quadrant abdominal discomfort  associated with nausea/vomiting, with CT abdomen/pelvis showing evidence of peripancreatic inflammatory changes consistent with acute pancreatitis in the absence of any evidence of associated necrosis or gallbladder pathology.  Suspect that this represents acute alcoholic pancreatitis given the patient's report of consumption of 2-7 beers on a daily basis since October 2019.  While alcohol appears to be the most likely etiology at this time, the differential does also include the possibility of a duodenal ulcer given the patient's reported history of peptic ulcer disease as well as recent alcohol consumption in addition to one episode of hematochezia, as above.  Management of patient's acute pancreatitis is complicated by his history of chronic diastolic heart failure, for which she was recently hospitalized, as above.  However, at time of this evening's presentation, he appears clinically dry, and therefore will continue IV fluid rehydration efforts, although to slightly lower rate relative to typical approach involving aggressive IVF's.  Given the patient's tolerance to opioids, I had considered initiating a Dilaudid PCA.  However, I ultimately reconsidered and elected to proceed with PRN IV Dilaudid dosing given the presence of his intrathecal fentanyl pump, which the patient reports he is unable to stop to turn off.  The patient reports that he has been on opioids for greater than 25 years, and conveys a significant tolerance to this class.   Plan: N.p.o.  Lactated Ringer's at 125 cc/h.  Will monitor closely for evidence of volume overload.  Monitor strict I's and O's.  PRN IV Dilaudid, as above.  PRN IV Zofran.  Check triglyceride level.  Will repeat CMP in the morning.  Counseled the patient on the importance of reduction in alcohol consumption.    #) Hypokalemia: Presenting labs reflect serum potassium of 2.0.  This is likely multifactorial in nature, with contributions from recent increase in diuresis  efforts following recent initiation of Bumex as well as contribution from increase in GI losses, given the patient's report of multiple daily episodes of nausea/vomiting over the last 2 to 3 days.  Prior to transfer from [facility] emergency department, the patient received potassium chloride 40 mill equivalents  over 4 hours x 1.  He will certainly require additional IV potassium supplementation.  Of note, serum magnesium level noted to be 2.6.  Plan: Potassium chloride 40 mEq IV over 4 hours x 1 now.  Will recheck BMP following completion of these additional 40 mEq, which should occur at approximately 1 AM on 06/11/2018.  We will also repeat serum magnesium level in the morning.  Hold home Bumex.  Monitor on telemetry.    #) Acute hypoosmolar hypovolemic hyponatremia: Presenting serum sodium noted to be 123 relative to 138 on 06/03/2018.  Suspect contributions from renal losses via interval diuresis as well as from intravascular depletion stemming from recent nausea/vomiting, as above.  Plan: IV fluids, as above.  Holding home Bumex for now.  Will check random urine sodium, urine osmolality, and TSH as well. Repeat BMP in the AM.     #) Chronic diastolic heart failure: Recently hospitalized from 05/29/2018 to 06/03/2018 for acute on chronic diastolic heart failure after presenting with complaints of shortness of breath and worsening of edema in the bilateral lower extremities.  Most recent echocardiogram, which was performed on 05/30/2018 showed LVEF 60 to 65%, evidence of diastolic dysfunction, mildly dilated left atrium, and no significant valvular pathology.  Ultimately, the patient was discharged to home on Bumex, which is his sole prescribed diuretic, and represents a new medication for him.  The patient reports 50 pounds of weight loss following diuresis efforts starting on 05/29/2018 through today.  No evidence of acutely decompensated heart failure at time of today's presentation, although  we will need to monitor closely for evidence of development of volume overload and plan for IV fluids associated with treatment of presenting acute pancreatitis, as above.   Plan: Monitor strict I's and O's.  Hold home Bumex for now.  Repeat CMP in the morning.     #) GERD: On Nexium as an outpatient.  Plan: In the setting of current n.p.o. status, ordered IV Protonix on a daily basis.      DVT prophylaxis: Lovenox 40 mg subcu daily Code Status: Full code (confirmed per my discussions today with the patient as well as his wife, who is present at bedside). Family Communication: Patient's case was discussed with his wife, who is present at bedside. Disposition Plan:  Per Rounding Team Consults called: (none)  Admission status: Inpatient; med telemetry.    PLEASE NOTE THAT DRAGON DICTATION SOFTWARE WAS USED IN THE CONSTRUCTION OF THIS NOTE.   Angie Fava DO [facility] Pager [phone] From 6PM- 2AM.   Otherwise, please contact night-coverage  [email or web address] Password [facility]  06/10/2018, 6:57 PM

## 2018-06-10 NOTE — Telephone Encounter (Signed)
Message forwarded to Dr [name] and he was notified verbally.

## 2018-06-10 NOTE — Patient Outreach (Addendum)
[facility]) Care Management  06/10/2018  [name] 06-Jan-1966 [id number]   EMMI- Heart Failure RED ON EMMI ALERT Day # 5 Date:06/08/2018 Red Alert Reason: New/worsening problems? Yes  EMMI- Heart Failure RED ON EMMI ALERT Day # 6 Date: 06/09/2018 Red Alert Reason:  Any new problems? Yes  New/worsening problems? Yes  New/worsening shortness of breath? Yes  Had diarrhea or felt sick to stomach? Yes  Nausea or vomiting? Yes  Fever or chills? Yes     Outreach attempt: spoke with wife who states that patient is at the ED right as he is having problems keeping things down.  Advised wife that CM would call another time to follow up.  She verbalized understanding.    Plan: RN CM will attempt patient again within 4 business days.    Update: 2:35 pm Patient being admitted to the [facility].  Will request Hospital Liaison to see patient when appropriate.    Brian Leriche, RN, MSN [facility] Care Management Care Management Coordinator Direct Line [phone] Cell [phone] Toll Free: [phone]  Fax: [phone]

## 2018-06-10 NOTE — ED Notes (Signed)
Per Dr. Particia Nearing, she has not received a call for consult. Called [facility] back - they advised that they had not heard back either.  I asked if they would expedite/follow up due to time that has passed.

## 2018-06-10 NOTE — ED Notes (Signed)
Attempted to call report to 5E; fast busy signal after ringing for approx 2 mins

## 2018-06-10 NOTE — ED Notes (Signed)
Patient transported to CT 

## 2018-06-10 NOTE — Progress Notes (Signed)
Brief note regarding plan, with full H&P to follow:   [name] is a 53 y.o. male with medical history significant for chronic diastolic heart failure, chronic pain syndrome on intrathecal fentanyl/bupivacaine pump, morbid obesity, GERD, who is admitted to [facility] long hospital on 06/10/2018 as transfer from [facility] ED with acute pancreatitis after presenting to the latter facility complaining of abdominal pain.   #) Acute alcoholic pancreatitis: Diagnosis on the basis of 2 to 3 days of progressive epigastric/left upper quadrant abdominal discomfort associated with nausea/vomiting, with CT abdomen/pelvis showing evidence of peripancreatic inflammatory changes consistent with acute pancreatitis in the absence of any evidence of associated necrosis or gallbladder pathology.  Suspect that this represents acute alcoholic pancreatitis given the patient's report of consumption of 2-7 beers on a daily basis since October 2019.  While alcohol appears to be the most likely etiology at this time, the differential does also include the possibility of a duodenal ulcer given the patient's reported history of peptic ulcer disease as well as recent alcohol consumption in addition to one episode of hematochezia, as above.  Management of patient's acute pancreatitis is complicated by his history of chronic diastolic heart failure, for which she was recently hospitalized, as above.  However, at time of this evening's presentation, he appears clinically dry, and therefore will continue IV fluid rehydration efforts, although to slightly lower rate relative to typical approach involving aggressive IVF's.  Given the patient's tolerance to opioids, I had considered initiating a Dilaudid PCA.  However, I ultimately reconsidered and elected to proceed with PRN IV Dilaudid dosing given the presence of his intrathecal fentanyl pump, which the patient reports he is unable to stop to turn off.   Plan: N.p.o.  Lactated  Ringer's at 125 cc/h.  Will monitor closely for evidence of volume overload.  Monitor strict I's and O's.  PRN IV Dilaudid, as above.  PRN IV Zofran.  Check triglyceride level.  Will repeat CMP in the morning.    #) Hypokalemia: Presenting labs reflect serum potassium of 2.0.  This is likely multifactorial in nature, with contributions from recent increase in diuresis efforts following recent initiation of Bumex as well as contribution from increase in GI losses, given the patient's report of multiple daily episodes of nausea/vomiting over the last 2 to 3 days.  Prior to transfer from [facility] emergency department, the patient received potassium chloride 40 mill equivalents over 4 hours x 1.  He will certainly require additional IV potassium supplementation.  Of note, serum magnesium level noted to be 2.6.  Plan: Potassium chloride 40 mEq IV over 4 hours x 1 now.  Will recheck BMP following completion of these 40 mEq, which should occur at approximately 1 AM on 06/11/2018.  We will also repeat serum magnesium level in the morning.  Hold home Bumex.  Monitor on telemetry.   Newton Pigg, DO Hospitalist

## 2018-06-10 NOTE — Telephone Encounter (Signed)
Just wanted to let you know the patient has been admitted into the hospital for pancreatitis.

## 2018-06-10 NOTE — ED Notes (Signed)
Pt reports nausea returned.

## 2018-06-10 NOTE — ED Notes (Signed)
Report to Ginger, Charity fundraiser at [employer]

## 2018-06-10 NOTE — ED Notes (Addendum)
Called Bed Placement to get status on bed assignment - s/w Rose - She advised that there are still no beds available for this patient.  Notified Adrienne Peele/Nurse.

## 2018-06-11 ENCOUNTER — Encounter (HOSPITAL_COMMUNITY): Payer: Self-pay | Admitting: Internal Medicine

## 2018-06-11 ENCOUNTER — Telehealth: Payer: Self-pay | Admitting: *Deleted

## 2018-06-11 ENCOUNTER — Other Ambulatory Visit: Payer: Self-pay | Admitting: *Deleted

## 2018-06-11 DIAGNOSIS — E871 Hypo-osmolality and hyponatremia: Secondary | ICD-10-CM | POA: Diagnosis present

## 2018-06-11 DIAGNOSIS — E876 Hypokalemia: Secondary | ICD-10-CM | POA: Diagnosis present

## 2018-06-11 DIAGNOSIS — K852 Alcohol induced acute pancreatitis without necrosis or infection: Secondary | ICD-10-CM | POA: Diagnosis present

## 2018-06-11 DIAGNOSIS — I5032 Chronic diastolic (congestive) heart failure: Secondary | ICD-10-CM | POA: Diagnosis present

## 2018-06-11 LAB — CBC
HCT: 49.4 % (ref 39.0–52.0)
Hemoglobin: 15.3 g/dL (ref 13.0–17.0)
MCH: 27.6 pg (ref 26.0–34.0)
MCHC: 31 g/dL (ref 30.0–36.0)
MCV: 89 fL (ref 80.0–100.0)
Platelets: 246 10*3/uL (ref 150–400)
RBC: 5.55 MIL/uL (ref 4.22–5.81)
RDW: 13.3 % (ref 11.5–15.5)
WBC: 9.3 10*3/uL (ref 4.0–10.5)
nRBC: 0 % (ref 0.0–0.2)

## 2018-06-11 LAB — COMPREHENSIVE METABOLIC PANEL
ALT: 17 U/L (ref 0–44)
AST: 17 U/L (ref 15–41)
Albumin: 4.1 g/dL (ref 3.5–5.0)
Alkaline Phosphatase: 76 U/L (ref 38–126)
Anion gap: 14 (ref 5–15)
BUN: 21 mg/dL — ABNORMAL HIGH (ref 6–20)
CO2: 34 mmol/L — ABNORMAL HIGH (ref 22–32)
Calcium: 8.9 mg/dL (ref 8.9–10.3)
Chloride: 83 mmol/L — ABNORMAL LOW (ref 98–111)
Creatinine, Ser: 0.85 mg/dL (ref 0.61–1.24)
GFR calc Af Amer: >60 mL/min (ref >60)
GFR calc non Af Amer: >60 mL/min (ref >60)
GLUCOSE: 126 mg/dL — AB (ref 70–99)
Potassium: 2.1 mmol/L — CL (ref 3.5–5.1)
Sodium: 131 mmol/L — ABNORMAL LOW (ref 135–145)
Total Bilirubin: 1.2 mg/dL (ref 0.3–1.2)
Total Protein: 8.3 g/dL — ABNORMAL HIGH (ref 6.5–8.1)

## 2018-06-11 LAB — TRIGLYCERIDES: Triglycerides: 76 mg/dL (ref <150)

## 2018-06-11 LAB — BASIC METABOLIC PANEL
Anion gap: 15 (ref 5–15)
BUN: 22 mg/dL — ABNORMAL HIGH (ref 6–20)
CO2: 34 mmol/L — ABNORMAL HIGH (ref 22–32)
CREATININE: 0.9 mg/dL (ref 0.61–1.24)
Calcium: 9.1 mg/dL (ref 8.9–10.3)
Chloride: 80 mmol/L — ABNORMAL LOW (ref 98–111)
GFR calc Af Amer: >60 mL/min (ref >60)
GFR calc non Af Amer: >60 mL/min (ref >60)
Glucose, Bld: 130 mg/dL — ABNORMAL HIGH (ref 70–99)
Potassium: 2.5 mmol/L — CL (ref 3.5–5.1)
Sodium: 129 mmol/L — ABNORMAL LOW (ref 135–145)

## 2018-06-11 LAB — SODIUM, URINE, RANDOM: Sodium, Ur: <10 mmol/L

## 2018-06-11 LAB — TSH: TSH: 1.708 u[IU]/mL (ref 0.350–4.500)

## 2018-06-11 LAB — CREATININE, URINE, RANDOM: Creatinine, Urine: 115.35 mg/dL

## 2018-06-11 LAB — OSMOLALITY, URINE: OSMOLALITY UR: 477 mosm/kg (ref 300–900)

## 2018-06-11 LAB — POTASSIUM: Potassium: 2.3 mmol/L — CL (ref 3.5–5.1)

## 2018-06-11 LAB — MAGNESIUM: Magnesium: 2.8 mg/dL — ABNORMAL HIGH (ref 1.7–2.4)

## 2018-06-11 MED ORDER — FOLIC ACID 1 MG PO TABS
1.0000 mg | ORAL_TABLET | Freq: Every day | ORAL | Status: DC
  Administered 2018-06-13 – 2018-06-14 (×2): 1 mg via ORAL
  Filled 2018-06-11 (×3): qty 1

## 2018-06-11 MED ORDER — SODIUM CHLORIDE 0.9 % IV SOLN
INTRAVENOUS | Status: DC
  Administered 2018-06-11 – 2018-06-13 (×2): via INTRAVENOUS

## 2018-06-11 MED ORDER — POTASSIUM CHLORIDE 10 MEQ/100ML IV SOLN
10.0000 meq | INTRAVENOUS | Status: AC
  Administered 2018-06-11 (×5): 10 meq via INTRAVENOUS
  Filled 2018-06-11 (×4): qty 100

## 2018-06-11 MED ORDER — HYDROMORPHONE HCL 1 MG/ML IJ SOLN
0.5000 mg | INTRAMUSCULAR | Status: DC | PRN
  Administered 2018-06-11 (×18): 2 mg via INTRAVENOUS
  Administered 2018-06-11: 1 mg via INTRAVENOUS
  Administered 2018-06-12 – 2018-06-14 (×22): 2 mg via INTRAVENOUS
  Filled 2018-06-11 (×41): qty 2

## 2018-06-11 MED ORDER — VITAMIN B-1 100 MG PO TABS
100.0000 mg | ORAL_TABLET | Freq: Every day | ORAL | Status: DC
  Administered 2018-06-13 – 2018-06-14 (×2): 100 mg via ORAL
  Filled 2018-06-11 (×3): qty 1

## 2018-06-11 MED ORDER — NALOXONE HCL 0.4 MG/ML IJ SOLN: 0.4000 mg | INTRAMUSCULAR | Status: DC | PRN

## 2018-06-11 MED ORDER — POTASSIUM CHLORIDE 10 MEQ/100ML IV SOLN
10.0000 meq | INTRAVENOUS | Status: AC
  Administered 2018-06-11 (×4): 10 meq via INTRAVENOUS
  Filled 2018-06-11 (×4): qty 100

## 2018-06-11 MED ORDER — POTASSIUM CHLORIDE 10 MEQ/100ML IV SOLN
INTRAVENOUS | Status: AC
  Administered 2018-06-11: 10 meq via INTRAVENOUS
  Filled 2018-06-11: qty 100

## 2018-06-11 MED ORDER — POTASSIUM CHLORIDE 10 MEQ/100ML IV SOLN
10.0000 meq | INTRAVENOUS | Status: DC
  Administered 2018-06-11 (×2): 10 meq via INTRAVENOUS
  Filled 2018-06-11 (×3): qty 100

## 2018-06-11 NOTE — Telephone Encounter (Signed)
-----   Message from Jarvis Newcomer, RN sent at 06/08/2018  1:51 PM EST ----- Regarding: pt needs to be scheduled for a sleep study Per Dr.Acharya, This pt has known untreated OSA and will need to establish care for sleep. By the pt report his last sleep study was around 10 yrs ago. Did adv pt that his sleep study will more than likely need to be repeated.

## 2018-06-11 NOTE — Progress Notes (Signed)
Patient had lost IV, potassium started late. 2 runs already completed. Hung 3rd run at 145. Still have one more run to go. Received call from lab potassium 2.5. Lab will repeat in am. Will continue to monitor.

## 2018-06-11 NOTE — Consult Note (Addendum)
   [facility] CM Inpatient Consult   06/11/2018  ROANAN BODLEY 07-10-1965 [id number]    Made aware of hospitalization by [facility] Telephonic RNCM. Spoke with Mr. Starliper and wife about [facility] Care Management engagement.   Mr. Beville states EMMI calls were "aggravating the tar out of me. I do not need any other calls." Mr. Wilcock and wife declined [facility] Care Management services.  Made Mr. Howse aware that he will likely receive EMMI discharge call after dc from [facility].  Declined [facility] Care Management brochure.  Made inpatient RNCM aware [facility] services were declined.  Updated [facility] Telephonic RNCM about bedside visit.  Raiford Noble, MSN-Ed, RN,BSN [facility] Liaison [phone]

## 2018-06-11 NOTE — Progress Notes (Signed)
PROGRESS NOTE    [name]  PRF:[id number] DOB: [birth year] DOA: 06/10/2018 PCP: [name], MD   Brief Narrative: Patient is a 53 year old male with history of chronic diastolic CHF, chronic pain syndrome on intrathecal fentanyl/bupivacaine pump, history of fall, morbid obesity, GERD who was transferred from [facility] for the management of acute pancreatitis.  Reports history of daily alcohol consumption to alleviate the pain.  CT abdomen/pelvis done on presentation showed early acute pancreatitis.  Assessment & Plan:   Principal Problem:   Acute alcoholic pancreatitis Active Problems:   Gastroesophageal reflux disease without esophagitis   Hypokalemia   Hyponatremia   Chronic diastolic CHF (congestive heart failure) (HCC)  Acute pancreatitis: Secondary to alcohol consumption.  Consumes alcohol daily.  Reports that he drinks 6-12 bottles of 12 ounce beer every day to alleviate the pain. CT finding as above.  Started on conservative management.  N.p.o. IV fluids.  Patient still having abdomen pain this morning so not started on diet.Continue pain management.  Chronic alcohol consumption: Start on thiamine and folic acid .Monitor for withdrawal.  Counseled for cessation  Hypokalemia: Severe.  Magnesium level normal.  Continue supplementation.  Heart failure with preserved ejection fraction: Echocardiogram done on 05/30/2018 showed ejection fraction of 65%, diastolic dysfunction.  He was admitted on 05/29/2018 and was discharged on 06/03/2018 for management of  diastolic CHF.On Bumex at home.  Chronic pain syndrome: History of fall with the development of chronic pain syndrome.  Follows at [facility] pain center with Dr. Ardine Bjork.  On fentanyl-bupivacaine via intrathecal pump.  Hyponatremia: Improving.  We will continue to monitor.         DVT prophylaxis: Lovenox Code Status: Full Family Communication: Present at the bedside Disposition Plan: Home after resolution  of abdominal pain   Consultants: None  Procedures: None  Antimicrobials:  Anti-infectives (From admission, onward)   None      Subjective: Patient seen and examined the bedside this morning.  Looked comfortable during my evaluation.  He still complains of abdominal pain and reluctant to start on clear liquid diet.  Hemodynamically stable  Objective: Vitals:   06/10/18 1833 06/10/18 2000 06/11/18 0500 06/11/18 0641  BP: 135/81 115/68  107/70  Pulse: 76 71  77  Resp: 20 20  20   Temp: 98.9 F (37.2 C) 99 F (37.2 C)  99 F (37.2 C)  TempSrc: Oral Oral  Oral  SpO2: 96% 95%  99%  Weight:   (!) 173 kg   Height:        Intake/Output Summary (Last 24 hours) at 06/11/2018 1345 Last data filed at 06/11/2018 0600 Gross per 24 hour  Intake 1190.18 ml  Output -  Net 1190.18 ml   Filed Weights   06/10/18 1014 06/11/18 0500  Weight: (!) 171.9 kg (!) 173 kg    Examination:  General exam: Not in distress,morbidly obese HEENT:PERRL,Oral mucosa moist, Ear/Nose normal on gross exam Respiratory system: Bilateral equal air entry, normal vesicular breath sounds, no wheezes or crackles  Cardiovascular system: S1 & S2 heard, RRR. No JVD, murmurs, rubs, gallops or clicks. No pedal edema. Gastrointestinal system: Abdomen is nondistended, soft and has tenderness on the epigastric region. no organomegaly or masses felt. Normal bowel sounds heard. Central nervous system: Alert and oriented. No focal neurological deficits. Extremities: No edema, no clubbing ,no cyanosis, distal peripheral pulses palpable. Skin: No rashes, lesions or ulcers,no icterus ,no pallor MSK: Normal muscle bulk,tone ,power Psychiatry: Judgement and insight appear normal. Mood & affect  appropriate.     Data Reviewed: I have personally reviewed following labs and imaging studies  CBC: Recent Labs  Lab 06/09/18 1733 06/10/18 1038 06/11/18 0542  WBC 11.7* 11.9* 9.3  NEUTROABS  --  9.6*  --   HGB 16.5 16.5 15.3    HCT 50.6 50.9 49.4  MCV 83.2 83.9 89.0  PLT 298 289 246   Basic Metabolic Panel: Recent Labs  Lab 06/09/18 1733 06/10/18 1200 06/11/18 0043 06/11/18 0542  NA 124* 123* 129* 131*  K 2.2* 2.0* 2.5* 2.1*  CL 74* 74* 80* 83*  CO2 34* 36* 34* 34*  GLUCOSE 148* 140* 130* 126*  BUN 16 22* 22* 21*  CREATININE 0.98 1.01 0.90 0.85  CALCIUM 9.6 9.2 9.1 8.9  MG  --  2.6*  --  2.8*   GFR: Estimated Creatinine Clearance: 165.3 mL/min (by C-G formula based on SCr of 0.85 mg/dL). Liver Function Tests: Recent Labs  Lab 06/09/18 1733 06/10/18 1200 06/11/18 0542  AST 25 20 17   ALT 26 22 17   ALKPHOS 74 78 76  BILITOT 1.2 1.4* 1.2  PROT 8.7* 8.7* 8.3*  ALBUMIN 4.4 4.3 4.1   Recent Labs  Lab 06/09/18 1733 06/10/18 1200  LIPASE 141* 99*   No results for input(s): AMMONIA in the last 168 hours. Coagulation Profile: No results for input(s): INR, PROTIME in the last 168 hours. Cardiac Enzymes: Recent Labs  Lab 06/10/18 1038  TROPONINI 0.03*   BNP (last 3 results) No results for input(s): PROBNP in the last 8760 hours. HbA1C: No results for input(s): HGBA1C in the last 72 hours. CBG: No results for input(s): GLUCAP in the last 168 hours. Lipid Profile: Recent Labs    06/11/18 0539  TRIG 76   Thyroid Function Tests: Recent Labs    06/11/18 0542  TSH 1.708   Anemia Panel: No results for input(s): VITAMINB12, FOLATE, FERRITIN, TIBC, IRON, RETICCTPCT in the last 72 hours. Sepsis Labs: No results for input(s): PROCALCITON, LATICACIDVEN in the last 168 hours.  No results found for this or any previous visit (from the past 240 hour(s)).       Radiology Studies: Dg Chest 2 View  Result Date: 06/10/2018 CLINICAL DATA:  Vomiting.  Epigastric pain. EXAM: CHEST - 2 VIEW COMPARISON:  01/04/2015. FINDINGS: Stable cardiomegaly and pulmonary venous congestion. Low lung volumes. No focal alveolar infiltrate. No pleural effusion or pneumothorax. No acute bony abnormality.  IMPRESSION: Stable cardiomegaly with pulmonary venous congestion. Low lung volumes. Electronically Signed   By: Maisie Fus  Register   On: 06/10/2018 11:20   Ct Abdomen Pelvis W Contrast  Result Date: 06/10/2018 CLINICAL DATA:  Nausea and vomiting EXAM: CT ABDOMEN AND PELVIS WITH CONTRAST TECHNIQUE: Multidetector CT imaging of the abdomen and pelvis was performed using the standard protocol following bolus administration of intravenous contrast. CONTRAST:  OMNIPAQUE 300 COMPARISON:  None. FINDINGS: Lower chest: No acute abnormality. Hepatobiliary: Diffuse fatty infiltration of the liver is noted. The gallbladder is within normal limits. Pancreas: Pancreas is well visualized with mild peripancreatic inflammatory changes consistent with acute pancreatitis. No significant phlegmon is noted at this time. Spleen: Spleen is within normal limits. Adrenals/Urinary Tract: Adrenal glands are within normal limits. Kidneys are well visualized with a normal enhancement pattern. No calculi or obstructive changes are noted. The bladder is partially distended. Stomach/Bowel: Changes consistent with prior gastric sleeve surgery are noted. No obstructive or inflammatory changes of the small bowel or colon are noted. The appendix is within normal limits. Vascular/Lymphatic:  No significant vascular findings are present. No enlarged abdominal or pelvic lymph nodes. Reproductive: Prostate is unremarkable. Other: No abdominal wall hernia or abnormality. No abdominopelvic ascites. Musculoskeletal: Postsurgical changes in the lumbar spine are noted. A infusion pump is noted extending into the thoracic spine. IMPRESSION: Changes consistent with early acute pancreatitis. No pseudocyst or significant phlegmon is noted at this time. Electronically Signed   By: Alcide Clever M.D.   On: 06/10/2018 11:23        Scheduled Meds: . enoxaparin (LOVENOX) injection  40 mg Subcutaneous Q24H  . pantoprazole (PROTONIX) IV  40 mg Intravenous  Q24H   Continuous Infusions: . lactated ringers 125 mL/hr at 06/11/18 1345  . potassium chloride 10 mEq (06/11/18 1335)     LOS: 1 day    Time spent: 35 mins.More than 50% of that time was spent in counseling and/or coordination of care.      Burnadette Pop, MD [facility] Pager [phone]  If 7PM-7AM, please contact night-coverage [email or web address] Password [email or web address] 06/11/2018, 1:45 PM

## 2018-06-11 NOTE — Progress Notes (Signed)
All runs of potassium complete. Patient still has unrelieved pain. Administering 2mg  dilaudid every hour. Pt still rates pain 8/10. Will continue to monitor.

## 2018-06-11 NOTE — Progress Notes (Signed)
CRITICAL VALUE STICKER  CRITICAL VALUE: K 2.1   DATE & TIME NOTIFIED: 06/11/18  0645  MESSENGER (representative from lab):  MD NOTIFIED:  Bodenheimer  TIME OF NOTIFICATION:  5320  RESPONSE: Awaiting    Administered 4 runs overnight. Complete

## 2018-06-11 NOTE — Telephone Encounter (Signed)
Patient notified of sleep study appointment scheduled for 07/19/18 @ [facility].

## 2018-06-12 LAB — BASIC METABOLIC PANEL
Anion gap: 12 (ref 5–15)
BUN: 15 mg/dL (ref 6–20)
CO2: 32 mmol/L (ref 22–32)
Calcium: 8.8 mg/dL — ABNORMAL LOW (ref 8.9–10.3)
Chloride: 89 mmol/L — ABNORMAL LOW (ref 98–111)
Creatinine, Ser: 0.78 mg/dL (ref 0.61–1.24)
GFR calc Af Amer: >60 mL/min (ref >60)
GFR calc non Af Amer: >60 mL/min (ref >60)
Glucose, Bld: 109 mg/dL — ABNORMAL HIGH (ref 70–99)
Potassium: 2.3 mmol/L — CL (ref 3.5–5.1)
Sodium: 133 mmol/L — ABNORMAL LOW (ref 135–145)

## 2018-06-12 LAB — NA AND K (SODIUM & POTASSIUM), RAND UR
POTASSIUM UR: 4 mmol/L
Sodium, Ur: <10 mmol/L

## 2018-06-12 LAB — MAGNESIUM: Magnesium: 2.7 mg/dL — ABNORMAL HIGH (ref 1.7–2.4)

## 2018-06-12 MED ORDER — POTASSIUM CHLORIDE CRYS ER 20 MEQ PO TBCR
40.0000 meq | EXTENDED_RELEASE_TABLET | Freq: Once | ORAL | Status: DC
  Filled 2018-06-12: qty 2

## 2018-06-12 MED ORDER — MAGNESIUM SULFATE 2 GM/50ML IV SOLN
2.0000 g | Freq: Once | INTRAVENOUS | Status: AC
  Administered 2018-06-12: 2 g via INTRAVENOUS
  Filled 2018-06-12: qty 50

## 2018-06-12 MED ORDER — POTASSIUM CHLORIDE 10 MEQ/100ML IV SOLN
INTRAVENOUS | Status: AC
  Administered 2018-06-12: 10 meq via INTRAVENOUS
  Filled 2018-06-12: qty 100

## 2018-06-12 MED ORDER — POTASSIUM CHLORIDE 10 MEQ/100ML IV SOLN
10.0000 meq | INTRAVENOUS | Status: AC
  Administered 2018-06-12 (×6): 10 meq via INTRAVENOUS
  Filled 2018-06-12 (×4): qty 100

## 2018-06-12 NOTE — Consult Note (Signed)
Reason for Consult:hypokalemia  Referring Physician: Dr. [name] is an 53 y.o. male.  HPI: 53 yr male with hx multiple medical prob including massive obesity despite gastric sleeve 2016, severe ms skel problems:  Knee, back, foot , with mulitple op:  Opiod dependance with implantable pump and po, ^lipids, OSA, neuopathic pain, polycythemia.  Recent hosp with fluid overload ?RHF and diuresis of 50 lb 2/15-2/20.  Now with abdm pain N, V, and acte pancreatitis in setting of xs beer intake.  On admit SNa  124 and S K 2.2.  K has ranged 2.2-2.5 past 2 d and is receiving repeated doses K iv (by this pm 160 mEq).  N, V, multiple time/d and watery D at least 4 times/d.  Was on loop diuretic at home and his alcohol intake.    No FH or renal dz or inherited ms skel,eye or hearing prob. No prob with recurrent ms weakness but has suffered falls in setting of his narcotics.  No abrupt collapsing.. Constitutional: as above primarily GI and Ms skel Eyes: negative Ears, nose, mouth, throat, and face: dry mouth Respiratory: can lie flat now, still has ankle edema, Needs CPAP Cardiovascular: as above Gastrointestinal: as above Genitourinary:negative Integument/breast: negative Hematologic/lymphatic: negative Musculoskeletal:back, foot, shoulder , knees Neurological: neuropathic pain in legs Endocrine: negative Allergic/Immunologic: tape     Past Medical History:  Diagnosis Date  . Arthritis   . Asthma    as child  . Back pain   . CHF (congestive heart failure) (HCC)   . GERD (gastroesophageal reflux disease)   . Hernia   . History of hiatal hernia   . Hypogonadism in male   . Neuromuscular disorder (HCC)    back injury with surgery  . PONV (postoperative nausea and vomiting)    gets nausea  . Shortness of breath dyspnea   . Sleep apnea    Does not use CPAP, new equipment 12/2014  . Ulcer   . Wears glasses     Past Surgical History:  Procedure Laterality Date  . BACK  SURGERY    . JOINT REPLACEMENT     Left knee replacement  . LAPAROSCOPIC GASTRIC SLEEVE RESECTION N/A 01/08/2015   Procedure: LAPAROSCOPIC GASTRIC SLEEVE RESECTION;  Surgeon: Luretha Murphy, MD;  Location: [facility];  Service: General;  Laterality: N/A;  . morphine pump    . SHOULDER ARTHROSCOPY WITH SUBACROMIAL DECOMPRESSION AND BICEP TENDON REPAIR Left 10/06/2013   Procedure: LEFT SHOULDER ARTHROSCOPY DEBRIDEMENT EXTENTSIVE,DISTAL CLAVICULECTOMY,DECOMPRESSION SUBACROMIAL PARTIAL ACROMIOPLASTY WITH ROTATOR CUFF REPAIR, and excision of CALCIUM DEPOSIT;  Surgeon: Loreta Ave, MD;  Location: [facility];  Service: Orthopedics;  Laterality: Left;  . SHOULDER ARTHROSCOPY WITH SUBACROMIAL DECOMPRESSION, ROTATOR CUFF REPAIR AND BICEP TENDON REPAIR Right 08/18/2013   Procedure: RIGHT SHOULDER ARTHROSCOPY WITH SUBACROMIAL DECOMPRESSION/PARTIAL ACROMIOPLASTY WITH CORACOACROMIAL RELEASE/DISTAL CLAVICULECTOMY/ ROTATOR CUFF REPAIR/DEBRIDEMENT EXTENTSIVE;  Surgeon: Loreta Ave, MD;  Location: [facility];  Service: Orthopedics;  Laterality: Right;  ANESTHESIA: GENERAL, PRE/POST OP SCALENE  . TONSILLECTOMY      Family History  Problem Relation Age of Onset  . Diabetes Mother   . Pneumonia Father     Social History:  reports that he has never smoked. His smokeless tobacco use includes snuff. He reports current alcohol use. He reports that he does not use drugs.  Allergies:  Allergies  Allergen Reactions  . Tape Other (See Comments)    Blisters. Pt tolerates paper tape.      Medications:  I have reviewed the patient's current medications. Prior to Admission:  Medications Prior to Admission  Medication Sig Dispense Refill Last Dose  . aspirin EC 81 MG EC tablet Take 1 tablet (81 mg total) by mouth daily. 30 tablet 0 06/10/2018 at Unknown time  . [START ON 06/30/2018] baclofen (LIORESAL) 10 MG tablet Take 1 tablet (10 mg total) by mouth 4 (four) times daily. 120 tablet 1  06/10/2018 at Unknown time  . bumetanide (BUMEX) 1 MG tablet Take 1 tablet (1 mg total) by mouth 2 (two) times daily for 30 days. 60 tablet 5 06/10/2018 at Unknown time  . [START ON 06/30/2018] esomeprazole (NEXIUM) 40 MG capsule Take 1 capsule (40 mg total) by mouth 2 (two) times daily. 60 capsule 1 06/10/2018 at Unknown time  . [START ON 06/30/2018] Gabapentin Enacarbil (HORIZANT) 600 MG TBCR Take 1 tablet (600 mg total) by mouth 2 (two) times daily. 120 tablet 0 06/10/2018 at Unknown time  . GNP MILK OF MAGNESIA 1200 MG/15ML suspension Take 5 mLs by mouth daily as needed for mild constipation.    06/10/2018 at Unknown time  . hydrOXYzine (ATARAX/VISTARIL) 25 MG tablet Take 25 mg by mouth every 8 (eight) hours as needed for itching.    06/10/2018 at Unknown time  . [START ON 06/30/2018] Magnesium Oxide 500 MG CAPS Take 1 capsule (500 mg total) by mouth 2 (two) times daily at 8 am and 10 pm. 60 capsule 1 06/09/2018 at Unknown time  . NON FORMULARY 959 mcg by Intrathecal route daily. IT pump Fentanyl 2,000.0 mcg/ml Bupivicaine 20.0 mg/ml 40 ml pump   continuous  . [START ON 06/30/2018] ondansetron (ZOFRAN-ODT) 8 MG disintegrating tablet Take 1 tablet (8 mg total) by mouth every 8 (eight) hours as needed for nausea or vomiting. 180 tablet 0 Past Week at Unknown time  . Oxycodone HCl 10 MG TABS Take 1 tablet (10 mg total) by mouth every 6 (six) hours as needed. Must last 30 days. Max: 4/day. (Patient taking differently: Take 10 mg by mouth every 6 (six) hours as needed (pain). Must last 30 days. Max: 4/day.) 120 tablet 0 06/10/2018 at Unknown time  . polycarbophil (FIBERCON) 625 MG tablet Take 625 mg by mouth 3 (three) times daily.    Past Week at Unknown time  . promethazine (PHENERGAN) 25 MG suppository Place 25 mg rectally every 6 (six) hours as needed for nausea/vomiting.   today  . promethazine (PHENERGAN) 25 MG tablet Take 25 mg by mouth every 8 (eight) hours.   06/10/2018 at Unknown time  . [EXPIRED]  cloNIDine (CATAPRES - DOSED IN MG/24 HR) 0.1 mg/24hr patch Place 1 patch (0.1 mg total) onto the skin once a week for 14 days. Discontinue if blood pressure is too low. (Patient not taking: Reported on 06/10/2018) 2 patch 0 Not Taking at Unknown time    Results for orders placed or performed during the hospital encounter of 06/10/18 (from the past 48 hour(s))  Ethanol     Status: None   Collection Time: 06/10/18  8:55 PM  Result Value Ref Range   Alcohol, Ethyl (B) <10 <10 mg/dL    Comment: (NOTE) Lowest detectable limit for serum alcohol is 10 mg/dL. For medical purposes only. Performed at [facility], [address], [place], [place] [address]   Basic metabolic panel     Status: Abnormal   Collection Time: 06/11/18 12:43 AM  Result Value Ref Range   Sodium 129 (L) 135 - 145 mmol/L  Potassium 2.5 (LL) 3.5 - 5.1 mmol/L    Comment: CRITICAL RESULT CALLED TO, READ BACK BY AND VERIFIED WITH: B SLADE RN 0118 06/11/18 A NAVARRO    Chloride 80 (L) 98 - 111 mmol/L   CO2 34 (H) 22 - 32 mmol/L   Glucose, Bld 130 (H) 70 - 99 mg/dL   BUN 22 (H) 6 - 20 mg/dL   Creatinine, Ser 4.090.90 0.61 - 1.24 mg/dL   Calcium 9.1 8.9 - 81.110.3 mg/dL   GFR calc non Af Amer >60 >60 mL/min   GFR calc Af Amer >60 >60 mL/min   Anion gap 15 5 - 15    Comment: Performed at [facility], [address] [place], [place] [address]  Sodium, urine, random     Status: None   Collection Time: 06/11/18  5:00 AM  Result Value Ref Range   Sodium, Ur <10 mmol/L    Comment: Performed at [facility], [address] [place], [place] [address]  Osmolality, urine     Status: None   Collection Time: 06/11/18  5:00 AM  Result Value Ref Range   Osmolality, Ur 477 300 - 900 mOsm/kg    Comment: Performed at [facility], [address] [place], [place] [address]  Creatinine, urine, random     Status: None   Collection Time: 06/11/18  5:00 AM  Result Value Ref Range    Creatinine, Urine 115.35 mg/dL    Comment: Performed at [facility], [address] [place], [place] [address]  Triglycerides     Status: None   Collection Time: 06/11/18  5:39 AM  Result Value Ref Range   Triglycerides 76 <150 mg/dL    Comment: Performed at [facility], [address] [place], [place] [address]  Comprehensive metabolic panel     Status: Abnormal   Collection Time: 06/11/18  5:42 AM  Result Value Ref Range   Sodium 131 (L) 135 - 145 mmol/L   Potassium 2.1 (LL) 3.5 - 5.1 mmol/L    Comment: CRITICAL RESULT CALLED TO, READ BACK BY AND VERIFIED WITH: JACKSON,V. RN AT [phone] 06/11/18 MULLINS,T    Chloride 83 (L) 98 - 111 mmol/L   CO2 34 (H) 22 - 32 mmol/L   Glucose, Bld 126 (H) 70 - 99 mg/dL   BUN 21 (H) 6 - 20 mg/dL   Creatinine, Ser 3.240.85 0.61 - 1.24 mg/dL   Calcium 8.9 8.9 - 40.110.3 mg/dL   Total Protein 8.3 (H) 6.5 - 8.1 g/dL   Albumin 4.1 3.5 - 5.0 g/dL   AST 17 15 - 41 U/L   ALT 17 0 - 44 U/L   Alkaline Phosphatase 76 38 - 126 U/L   Total Bilirubin 1.2 0.3 - 1.2 mg/dL   GFR calc non Af Amer >60 >60 mL/min   GFR calc Af Amer >60 >60 mL/min   Anion gap 14 5 - 15    Comment: Performed at [facility], [address] [place], [place] [address]  Magnesium     Status: Abnormal   Collection Time: 06/11/18  5:42 AM  Result Value Ref Range   Magnesium 2.8 (H) 1.7 - 2.4 mg/dL    Comment: Performed at [facility], [address] [place], [place] [address]  CBC     Status: None   Collection Time: 06/11/18  5:42 AM  Result Value Ref Range   WBC 9.3 4.0 - 10.5 K/uL  RBC 5.55 4.22 - 5.81 MIL/uL   Hemoglobin 15.3 13.0 - 17.0 g/dL   HCT 16.1 09.6 - 04.5 %   MCV 89.0 80.0 - 100.0 fL   MCH 27.6 26.0 - 34.0 pg   MCHC 31.0 30.0 - 36.0 g/dL   RDW 40.9 81.1 - 91.4 %   Platelets 246 150 - 400 K/uL   nRBC 0.0 0.0 - 0.2 %    Comment: Performed at [facility], [address], [place], [place] [address]  TSH     Status: None   Collection Time: 06/11/18  5:42 AM  Result Value Ref Range   TSH 1.708 0.350 - 4.500 uIU/mL    Comment: Performed by a 3rd Generation assay with a functional sensitivity of <=0.01 uIU/mL. Performed at [facility], [address], [place], [place] [address]   Potassium     Status: Abnormal   Collection Time: 06/11/18  1:28 PM  Result Value Ref Range   Potassium 2.3 (LL) 3.5 - 5.1 mmol/L    Comment: CRITICAL RESULT CALLED TO, READ BACK BY AND VERIFIED WITH: H.YOUNT AT 1437 ON 06/11/18 BY N.THOMPSON Performed at [facility], [address], [place], [place] [address]   Basic metabolic panel     Status: Abnormal   Collection Time: 06/12/18  5:46 AM  Result Value Ref Range   Sodium 133 (L) 135 - 145 mmol/L   Potassium 2.3 (LL) 3.5 - 5.1 mmol/L    Comment: CRITICAL RESULT CALLED TO, READ BACK BY AND VERIFIED WITH: HASTINGS,M. RN AT 4696 06/12/18 MULLINS,T    Chloride 89 (L) 98 - 111 mmol/L   CO2 32 22 - 32 mmol/L   Glucose, Bld 109 (H) 70 - 99 mg/dL   BUN 15 6 - 20 mg/dL   Creatinine, Ser 2.95 0.61 - 1.24 mg/dL   Calcium 8.8 (L) 8.9 - 10.3 mg/dL   GFR calc non Af Amer >60 >60 mL/min   GFR calc Af Amer >60 >60 mL/min   Anion gap 12 5 - 15    Comment: Performed at [facility], [address], [place], [place] [address]    No results found.  ROS Blood pressure 125/70, pulse 75, temperature 98.8 F (37.1 C), temperature source Oral, resp. rate 18, height 5' 10.5" (1.791 m), weight (!) 174.2 kg, SpO2 100 %. Physical Exam Physical Examination: General appearance - massive obesity , NAD, V during eval Mental status - alert, oriented to person, place, and time Eyes - pupils equal and reactive, extraocular eye movements intact, funduscopic exam normal, discs flat and sharp Mouth - mucous membranes moist, pharynx normal without lesions Neck - adenopathy noted PCL Lymphatics -  posterior cervical nodes Chest - decreased air entry noted bilat Heart - S1 and S2 normal, systolic murmur Gr2/6 at apex Abdomen - massive, pos bs., liver down 8 cm,  Mod epigastric tender Extremities - pedal edema 2 +, intact peripheral pulses Skin - plethoric face  Assessment/Plan: 1 Hypokalemia  Multiple etiologies:  Massive diuresis, D, V, pancreatitis,  alcohol use. With low U Na is losing K in response. Needs large amt K to replete and need to check Mg esp with D, and ETOH. 2 Hyponatremia potomania 3 Vol xs still some but not bothersome 4. Pancreatitis ETOH, ? TG 5. Massive obesity needs behavior modification 6 Chronic pain P iv K, iv Mg, check Mg . Will check Urine K but probably not helpful in setting of repletion  [name] 06/12/2018, 12:31 PM

## 2018-06-12 NOTE — Progress Notes (Signed)
CRITICAL VALUE ALERT  Critical Value: potassium- 2.3  Date & Time Notied:  06/12/2018 @0720   Provider Notified: Adhikari  Orders Received/Actions taken: pending

## 2018-06-12 NOTE — Progress Notes (Signed)
PROGRESS NOTE    [name]  DCV:[id number] DOB: 1965-05-17 DOA: 06/10/2018 PCP: [name], MD   Brief Narrative: Patient is a 53 year old male with history of chronic diastolic CHF, chronic pain syndrome on intrathecal fentanyl/bupivacaine pump, history of fall, morbid obesity, GERD who was transferred from [facility] for the management of acute pancreatitis.  Reports history of daily alcohol consumption to alleviate the pain.  CT abdomen/pelvis done on presentation showed early acute pancreatitis.  Started on IV fluids.  Hospital course remarkable for persistent hypokalemia.  Nephrology consulted today.  Assessment & Plan:   Principal Problem:   Acute alcoholic pancreatitis Active Problems:   Gastroesophageal reflux disease without esophagitis   Hypokalemia   Hyponatremia   Chronic diastolic CHF (congestive heart failure) (HCC)  Acute pancreatitis: Secondary to alcohol consumption.  Consumes alcohol daily.  Reports that he drinks 6-12 bottles of 12 ounce beer every day to alleviate the pain. CT finding as above.  Started on conservative management.  N.p.o. IV fluids.  Patient still having abdomen pain this morning so not started on diet.Continue pain management.  Chronic alcohol consumption: Start on thiamine and folic acid .Monitor for withdrawal.  Counseled for cessation  Hypokalemia: Severe.  Magnesium level normal.  Continue supplementation.  Nephrology consulted today for persistent hyperkalemia.  Heart failure with preserved ejection fraction: Echocardiogram done on 05/30/2018 showed ejection fraction of 65%, diastolic dysfunction.  He was admitted on 05/29/2018 and was discharged on 06/03/2018 for management of  diastolic CHF.On Bumex at home.  Chronic pain syndrome: History of fall with the development of chronic pain syndrome.  Follow[facility] at [facility] clinic.  On fentanyl-bupivacaine via intrathecal pump.  Hyponatremia: Improving.  We will continue  to monitor.  OSA: Noncompliant with CPAP.  Morbid obesity: BMI of 54.3.         DVT prophylaxis: Lovenox Code Status: Full Family Communication: Present at the bedside Disposition Plan: Home after resolution of abdominal pain   Consultants: None  Procedures: None  Antimicrobials:  Anti-infectives (From admission, onward)   None      Subjective: Patient seen and examined the bedside this morning.  Still complains of epigastric pain.  Pain might have improved from yesterday.  No nausea or vomiting.  Patient concerned about IV fluids running and inadequate pain medications.  Objective: Vitals:   06/11/18 2016 06/12/18 0500 06/12/18 0534 06/12/18 0740  BP: 119/74  126/73 125/70  Pulse: 70  76 75  Resp: 16  16 18   Temp: 98.7 F (37.1 C)  98.7 F (37.1 C) 98.8 F (37.1 C)  TempSrc:   Oral Oral  SpO2: 99%  100% 100%  Weight:  (!) 174.2 kg    Height:        Intake/Output Summary (Last 24 hours) at 06/12/2018 1113 Last data filed at 06/12/2018 0300 Gross per 24 hour  Intake 1089.54 ml  Output 650 ml  Net 439.54 ml   Filed Weights   06/10/18 1014 06/11/18 0500 06/12/18 0500  Weight: (!) 171.9 kg (!) 173 kg (!) 174.2 kg    Examination:  General exam:Not in distress,morbidly obese HEENT:PERRL,Oral mucosa moist, Ear/Nose normal on gross exam Respiratory system: Bilateral equal air entry, normal vesicular breath sounds, no wheezes or crackles  Cardiovascular system: S1 & S2 heard, RRR. No JVD, murmurs, rubs, gallops or clicks. Gastrointestinal system: Abdomen is nondistended, soft .  Tenderness on the epigastric region.  No organomegaly or masses felt. Normal bowel sounds heard. Central nervous system: Alert  and oriented. No focal neurological deficits. Extremities: No edema, no clubbing ,no cyanosis, distal peripheral pulses palpable. Skin: No rashes, lesions or ulcers,no icterus ,no pallor     Data Reviewed: I have personally reviewed following labs and  imaging studies  CBC: Recent Labs  Lab 06/09/18 1733 06/10/18 1038 06/11/18 0542  WBC 11.7* 11.9* 9.3  NEUTROABS  --  9.6*  --   HGB 16.5 16.5 15.3  HCT 50.6 50.9 49.4  MCV 83.2 83.9 89.0  PLT 298 289 246   Basic Metabolic Panel: Recent Labs  Lab 06/09/18 1733 06/10/18 1200 06/11/18 0043 06/11/18 0542 06/11/18 1328 06/12/18 0546  NA 124* 123* 129* 131*  --  133*  K 2.2* 2.0* 2.5* 2.1* 2.3* 2.3*  CL 74* 74* 80* 83*  --  89*  CO2 34* 36* 34* 34*  --  32  GLUCOSE 148* 140* 130* 126*  --  109*  BUN 16 22* 22* 21*  --  15  CREATININE 0.98 1.01 0.90 0.85  --  0.78  CALCIUM 9.6 9.2 9.1 8.9  --  8.8*  MG  --  2.6*  --  2.8*  --   --    GFR: Estimated Creatinine Clearance: 176.5 mL/min (by C-G formula based on SCr of 0.78 mg/dL). Liver Function Tests: Recent Labs  Lab 06/09/18 1733 06/10/18 1200 06/11/18 0542  AST 25 20 17   ALT 26 22 17   ALKPHOS 74 78 76  BILITOT 1.2 1.4* 1.2  PROT 8.7* 8.7* 8.3*  ALBUMIN 4.4 4.3 4.1   Recent Labs  Lab 06/09/18 1733 06/10/18 1200  LIPASE 141* 99*   No results for input(s): AMMONIA in the last 168 hours. Coagulation Profile: No results for input(s): INR, PROTIME in the last 168 hours. Cardiac Enzymes: Recent Labs  Lab 06/10/18 1038  TROPONINI 0.03*   BNP (last 3 results) No results for input(s): PROBNP in the last 8760 hours. HbA1C: No results for input(s): HGBA1C in the last 72 hours. CBG: No results for input(s): GLUCAP in the last 168 hours. Lipid Profile: Recent Labs    06/11/18 0539  TRIG 76   Thyroid Function Tests: Recent Labs    06/11/18 0542  TSH 1.708   Anemia Panel: No results for input(s): VITAMINB12, FOLATE, FERRITIN, TIBC, IRON, RETICCTPCT in the last 72 hours. Sepsis Labs: No results for input(s): PROCALCITON, LATICACIDVEN in the last 168 hours.  No results found for this or any previous visit (from the past 240 hour(s)).       Radiology Studies: No results found.      Scheduled  Meds: . enoxaparin (LOVENOX) injection  40 mg Subcutaneous Q24H  . folic acid  1 mg Oral Daily  . potassium chloride  40 mEq Oral Once  . thiamine  100 mg Oral Daily   Continuous Infusions: . sodium chloride 100 mL/hr at 06/12/18 0300  . potassium chloride 10 mEq (06/12/18 1036)     LOS: 2 days    Time spent: 25 mins.More than 50% of that time was spent in counseling and/or coordination of care.      Burnadette Pop, MD [facility] Pager [phone]  If 7PM-7AM, please contact night-coverage [email or web address] Password [email or web address] 06/12/2018, 11:13 AM

## 2018-06-13 LAB — BASIC METABOLIC PANEL
Anion gap: 12 (ref 5–15)
Anion gap: 11 (ref 5–15)
Anion gap: 11 (ref 5–15)
BUN: 13 mg/dL (ref 6–20)
BUN: 12 mg/dL (ref 6–20)
BUN: 11 mg/dL (ref 6–20)
CO2: 31 mmol/L (ref 22–32)
CO2: 29 mmol/L (ref 22–32)
CO2: 29 mmol/L (ref 22–32)
CREATININE: 0.8 mg/dL (ref 0.61–1.24)
Calcium: 8.8 mg/dL — ABNORMAL LOW (ref 8.9–10.3)
Calcium: 8.4 mg/dL — ABNORMAL LOW (ref 8.9–10.3)
Calcium: 9.1 mg/dL (ref 8.9–10.3)
Chloride: 93 mmol/L — ABNORMAL LOW (ref 98–111)
Chloride: 95 mmol/L — ABNORMAL LOW (ref 98–111)
Chloride: 96 mmol/L — ABNORMAL LOW (ref 98–111)
Creatinine, Ser: 0.72 mg/dL (ref 0.61–1.24)
Creatinine, Ser: 0.76 mg/dL (ref 0.61–1.24)
GFR calc Af Amer: >60 mL/min (ref >60)
GFR calc Af Amer: >60 mL/min (ref >60)
GFR calc Af Amer: >60 mL/min (ref >60)
GFR calc non Af Amer: >60 mL/min (ref >60)
Glucose, Bld: 117 mg/dL — ABNORMAL HIGH (ref 70–99)
Glucose, Bld: 109 mg/dL — ABNORMAL HIGH (ref 70–99)
Glucose, Bld: 104 mg/dL — ABNORMAL HIGH (ref 70–99)
POTASSIUM: 2.7 mmol/L — AB (ref 3.5–5.1)
POTASSIUM: 3.9 mmol/L (ref 3.5–5.1)
Potassium: 2.9 mmol/L — ABNORMAL LOW (ref 3.5–5.1)
Sodium: 136 mmol/L (ref 135–145)
Sodium: 135 mmol/L (ref 135–145)
Sodium: 136 mmol/L (ref 135–145)

## 2018-06-13 MED ORDER — POTASSIUM CHLORIDE 10 MEQ/100ML IV SOLN
10.0000 meq | INTRAVENOUS | Status: AC
  Administered 2018-06-13 (×5): 10 meq via INTRAVENOUS
  Filled 2018-06-13 (×4): qty 100

## 2018-06-13 MED ORDER — POTASSIUM CHLORIDE CRYS ER 20 MEQ PO TBCR
40.0000 meq | EXTENDED_RELEASE_TABLET | Freq: Three times a day (TID) | ORAL | Status: DC
  Administered 2018-06-13: 40 meq via ORAL
  Filled 2018-06-13: qty 2

## 2018-06-13 MED ORDER — POTASSIUM CHLORIDE CRYS ER 20 MEQ PO TBCR
40.0000 meq | EXTENDED_RELEASE_TABLET | Freq: Once | ORAL | Status: AC
  Administered 2018-06-13: 40 meq via ORAL
  Filled 2018-06-13: qty 2

## 2018-06-13 MED ORDER — POTASSIUM CHLORIDE 10 MEQ/100ML IV SOLN
INTRAVENOUS | Status: AC
  Administered 2018-06-13: 100 meq
  Filled 2018-06-13: qty 100

## 2018-06-13 NOTE — Progress Notes (Signed)
Subjective: Interval History: has no complaint, just had D stool.  Admits beer is an issue for him.  Objective: Vital signs in last 24 hours: Temp:  [98 F (36.7 C)-98.5 F (36.9 C)] 98.5 F (36.9 C) (03/01 0300) Pulse Rate:  [81-110] 81 (03/01 0300) Resp:  [18-20] 18 (03/01 0300) BP: (128-145)/(82-83) 128/83 (03/01 0300) SpO2:  [94 %-100 %] 94 % (03/01 0300) Weight:  [174.3 kg] 174.3 kg (03/01 0500) Weight change: 0.119 kg  Intake/Output from previous day: 02/29 0701 - 03/01 0700 In: 2486 [I.V.:2014.9; IV Piggyback:471.1] Out: -  Intake/Output this shift: No intake/output data recorded.  General appearance: alert, cooperative, no distress and morbidly obese Resp: diminished breath sounds bilaterally Cardio: S1, S2 normal and systolic murmur: holosystolic 2/6, blowing at apex GI: obese, pos bs, mild epigastric tender Extremities: edema 2+  Lab Results: Recent Labs    06/10/18 1038 06/11/18 0542  WBC 11.9* 9.3  HGB 16.5 15.3  HCT 50.9 49.4  PLT 289 246   BMET:  Recent Labs    06/12/18 2116 06/13/18 0552  NA 136 135  K 2.9* 2.7*  CL 93* 95*  CO2 31 29  GLUCOSE 117* 109*  BUN 13 12  CREATININE 0.80 0.72  CALCIUM 8.8* 8.4*   No results for input(s): PTH in the last 72 hours. Iron Studies: No results for input(s): IRON, TIBC, TRANSFERRIN, FERRITIN in the last 72 hours.  Studies/Results: No results found.  I have reviewed the patient's current medications.  Assessment/Plan: 1 Hypokalemia slowly better. Severely deplete secondary to diuretics, alcohol, diarrhea.  May have issue absorbing Kdur with D, so may need to use liquid K, or microencapsulated 2 Beer drinkers potomania 3 Pain syndrome 4 Massive obesity 5 RHF 6 Pancreatitis P kcl po, follow k,,     LOS: 3 days   Fayrene Fearing Aryssa Rosamond 06/13/2018,10:27 AM

## 2018-06-13 NOTE — Progress Notes (Signed)
PROGRESS NOTE    [name]  JQD:[id number] DOB: Apr 08, 1966 DOA: 06/10/2018 PCP: [name], MD   Brief Narrative: Patient is a 53 year old male with history of chronic diastolic CHF, chronic pain syndrome on intrathecal fentanyl/bupivacaine pump, history of fall, morbid obesity, GERD who was transferred from [facility] for the management of acute pancreatitis.  Reports history of daily alcohol consumption to alleviate the pain.  CT abdomen/pelvis done on presentation showed early acute pancreatitis.  Started on IV fluids.  Hospital course remarkable for persistent hypokalemia.  Nephrology following.  Assessment & Plan:   Principal Problem:   Acute alcoholic pancreatitis Active Problems:   Gastroesophageal reflux disease without esophagitis   Hypokalemia   Hyponatremia   Chronic diastolic CHF (congestive heart failure) (HCC)  Acute pancreatitis: Secondary to alcohol consumption.  Consumes alcohol daily.  Reports that he drinks 6-12 bottles of 12 ounce beer every day to alleviate the pain. CT finding as above.  Started on conservative management. . IV fluids.  Abdomen pain is improved this morning.  Will start on clear liquid diet.  Will advance the diet gradually.  Had a watery bowel movement today.  Chronic alcohol consumption: Start on thiamine and folic acid .Monitor for withdrawal.  Counseled for cessation  Hypokalemia: Severe.  Magnesium level normal.  Continue supplementation.  Nephrology consulted for persistent hyperkalemia.  Heart failure with preserved ejection fraction: Echocardiogram done on 05/30/2018 showed ejection fraction of 65%, diastolic dysfunction.  He was admitted on 05/29/2018 and was discharged on 06/03/2018 for management of  diastolic CHF.On Bumex at home.  Chronic pain syndrome: History of fall with the development of chronic pain syndrome.  Follows at [facility] pain clinic.  On fentanyl-bupivacaine via intrathecal pump.  Hyponatremia:  Resolved  OSA: Noncompliant with CPAP.  Morbid obesity: BMI of 54.3.         DVT prophylaxis: Lovenox Code Status: Full Family Communication: Present at the bedside Disposition Plan: Home after resolution of pancreatitis and hypokalemia   Consultants: None  Procedures: None  Antimicrobials:  Anti-infectives (From admission, onward)   None      Subjective: Patient seen and examined the bedside this morning.  Remains comfortable.  Abdominal pain has significantly improved today.  Had a bowel movement which was watery.    Objective: Vitals:   06/12/18 2048 06/12/18 2050 06/13/18 0300 06/13/18 0500  BP:  (!) 145/82 128/83   Pulse: (!) 110 (!) 106 81   Resp:  20 18   Temp:  98 F (36.7 C) 98.5 F (36.9 C)   TempSrc:  Oral Oral   SpO2: 95% 100% 94%   Weight:    (!) 174.3 kg  Height:        Intake/Output Summary (Last 24 hours) at 06/13/2018 1034 Last data filed at 06/13/2018 0300 Gross per 24 hour  Intake 2486.01 ml  Output -  Net 2486.01 ml   Filed Weights   06/11/18 0500 06/12/18 0500 06/13/18 0500  Weight: (!) 173 kg (!) 174.2 kg (!) 174.3 kg    Examination:  General exam: Appears calm and comfortable ,Not in distress,obese HEENT:PERRL,Oral mucosa moist, Ear/Nose normal on gross exam Respiratory system: Bilateral equal air entry, normal vesicular breath sounds, no wheezes or crackles  Cardiovascular system: S1 & S2 heard, RRR. No JVD, murmurs, rubs, gallops or clicks. Gastrointestinal system: Abdomen is nondistended, soft .Mild generalized tenderness.No organomegaly or masses felt. Normal bowel sounds heard. Central nervous system: Alert and oriented. No focal neurological deficits. Extremities: No  edema, no clubbing ,no cyanosis, distal peripheral pulses palpable. Skin: No rashes, lesions or ulcers,no icterus ,no pallor  Data Reviewed: I have personally reviewed following labs and imaging studies  CBC: Recent Labs  Lab 06/09/18 1733 06/10/18 1038  06/11/18 0542  WBC 11.7* 11.9* 9.3  NEUTROABS  --  9.6*  --   HGB 16.5 16.5 15.3  HCT 50.6 50.9 49.4  MCV 83.2 83.9 89.0  PLT 298 289 246   Basic Metabolic Panel: Recent Labs  Lab 06/10/18 1200 06/11/18 0043 06/11/18 0542 06/11/18 1328 06/12/18 0546 06/12/18 1240 06/12/18 2116 06/13/18 0552  NA 123* 129* 131*  --  133*  --  136 135  K 2.0* 2.5* 2.1* 2.3* 2.3*  --  2.9* 2.7*  CL 74* 80* 83*  --  89*  --  93* 95*  CO2 36* 34* 34*  --  32  --  31 29  GLUCOSE 140* 130* 126*  --  109*  --  117* 109*  BUN 22* 22* 21*  --  15  --  13 12  CREATININE 1.01 0.90 0.85  --  0.78  --  0.80 0.72  CALCIUM 9.2 9.1 8.9  --  8.8*  --  8.8* 8.4*  MG 2.6*  --  2.8*  --   --  2.7*  --   --    GFR: Estimated Creatinine Clearance: 176.5 mL/min (by C-G formula based on SCr of 0.72 mg/dL). Liver Function Tests: Recent Labs  Lab 06/09/18 1733 06/10/18 1200 06/11/18 0542  AST 25 20 17   ALT 26 22 17   ALKPHOS 74 78 76  BILITOT 1.2 1.4* 1.2  PROT 8.7* 8.7* 8.3*  ALBUMIN 4.4 4.3 4.1   Recent Labs  Lab 06/09/18 1733 06/10/18 1200  LIPASE 141* 99*   No results for input(s): AMMONIA in the last 168 hours. Coagulation Profile: No results for input(s): INR, PROTIME in the last 168 hours. Cardiac Enzymes: Recent Labs  Lab 06/10/18 1038  TROPONINI 0.03*   BNP (last 3 results) No results for input(s): PROBNP in the last 8760 hours. HbA1C: No results for input(s): HGBA1C in the last 72 hours. CBG: No results for input(s): GLUCAP in the last 168 hours. Lipid Profile: Recent Labs    06/11/18 0539  TRIG 76   Thyroid Function Tests: Recent Labs    06/11/18 0542  TSH 1.708   Anemia Panel: No results for input(s): VITAMINB12, FOLATE, FERRITIN, TIBC, IRON, RETICCTPCT in the last 72 hours. Sepsis Labs: No results for input(s): PROCALCITON, LATICACIDVEN in the last 168 hours.  No results found for this or any previous visit (from the past 240 hour(s)).       Radiology  Studies: No results found.      Scheduled Meds: . enoxaparin (LOVENOX) injection  40 mg Subcutaneous Q24H  . folic acid  1 mg Oral Daily  . potassium chloride  40 mEq Oral Once  . potassium chloride  40 mEq Oral TID  . thiamine  100 mg Oral Daily   Continuous Infusions: . potassium chloride 10 mEq (06/13/18 1022)     LOS: 3 days    Time spent: 25 mins.More than 50% of that time was spent in counseling and/or coordination of care.      Burnadette Pop, MD [facility] Pager [phone]  If 7PM-7AM, please contact night-coverage [email or web address] Password [facility] 06/13/2018, 10:34 AM

## 2018-06-13 NOTE — Progress Notes (Signed)
CRITICAL VALUE ALERT  Critical Value:  Potassium 2.7  Date & Time Notied:  06/13/18 0700  Provider Notified: Adhikari  Orders Received/Actions taken: awaiting orders

## 2018-06-14 LAB — BASIC METABOLIC PANEL
Anion gap: 8 (ref 5–15)
BUN: 9 mg/dL (ref 6–20)
CALCIUM: 8.7 mg/dL — AB (ref 8.9–10.3)
CO2: 27 mmol/L (ref 22–32)
Chloride: 100 mmol/L (ref 98–111)
Creatinine, Ser: 0.74 mg/dL (ref 0.61–1.24)
GFR calc Af Amer: >60 mL/min (ref >60)
Glucose, Bld: 128 mg/dL — ABNORMAL HIGH (ref 70–99)
Potassium: 4 mmol/L (ref 3.5–5.1)
SODIUM: 135 mmol/L (ref 135–145)

## 2018-06-14 MED ORDER — THIAMINE HCL 100 MG PO TABS: 100.0000 mg | ORAL_TABLET | Freq: Every day | ORAL | 0 refills | Status: DC

## 2018-06-14 MED ORDER — FOLIC ACID 1 MG PO TABS: 1.0000 mg | ORAL_TABLET | Freq: Every day | ORAL | 0 refills | Status: DC

## 2018-06-14 MED ORDER — POTASSIUM CHLORIDE CRYS ER 20 MEQ PO TBCR
40.0000 meq | EXTENDED_RELEASE_TABLET | Freq: Once | ORAL | Status: AC
  Administered 2018-06-14: 40 meq via ORAL
  Filled 2018-06-14: qty 2

## 2018-06-14 MED ORDER — POTASSIUM CHLORIDE ER 20 MEQ PO TBCR: 40.0000 meq | EXTENDED_RELEASE_TABLET | Freq: Every day | ORAL | 0 refills | Status: DC

## 2018-06-14 NOTE — Care Management Important Message (Signed)
Important Message  Patient Details  Name: [name] MRN: [id number] Date of Birth: 20-May-1965   Medicare Important Message Given:  Yes    Caren Macadam 06/14/2018, 11:10 AMImportant Message  Patient Details  Name: [name] MRN: [id number] Date of Birth: April 25, 1965   Medicare Important Message Given:  Yes    Caren Macadam 06/14/2018, 11:10 AM

## 2018-06-14 NOTE — Care Management Note (Signed)
Case Management Note  Patient Details  Name: [name] MRN: [id number] Date of Birth: [birth year]  Subjective/Objective:                  discharged  Action/Plan: Discharged to home with self-care, orders checked for hhc needs. No CM needs present at time of discharge.  Patient is able to arrangement own appointments and home care.  Expected Discharge Date:  06/14/18               Expected Discharge Plan:  Home/Self Care  In-House Referral:     Discharge planning Services  CM Consult  Post Acute Care Choice:    Choice offered to:     DME Arranged:    DME Agency:     HH Arranged:    HH Agency:     Status of Service:  Completed, signed off  If discussed at [employer] of Stay Meetings, dates discussed:    Additional Comments:  Golda Acre, RN 06/14/2018, 10:47 AM

## 2018-06-14 NOTE — Discharge Summary (Signed)
Physician Discharge Summary  [name] BJY:[id number] DOB: February 24, 1966 DOA: 06/10/2018  PCP: [name], MD  Admit date: 06/10/2018 Discharge date: 06/14/2018  Admitted From: Home Disposition:  Home  Discharge Condition:Stable CODE STATUS:FULL, Diet recommendation: Heart Healthy .Soft diet for next 2-3 days  Brief/Interim Summary:  Patient is a 53 year old male with history of chronic diastolic CHF, chronic pain syndrome on intrathecal fentanyl/bupivacaine pump, history of fall, morbid obesity, GERD who was transferred from [facility] for the management of acute pancreatitis.  Reports history of daily alcohol consumption to alleviate the pain.  CT abdomen/pelvis done on presentation showed early acute pancreatitis.  Started on IV fluids. Hospital course remarkable for persistent hypokalemia.  Nephrology was following. His abdominal pain has significantly improved today.  No nausea or vomiting.  He has been having bowel movements.  He tolerated clear liquid diet.  Patient is stable for discharge to home with a soft diet for next 2 to 3 days.  He will be supplemented on potassium on discharge.  He should check his BMP in a week to check for potassium level.  Following problems were addressed during his hospitalization:  Acute pancreatitis: Secondary to alcohol consumption.  Consumes alcohol daily.  Reports that he drinks 6-12 bottles of 12 ounce beer every day to alleviate the pain. CT finding as above.  Started on conservative management. . IV fluids.  Abdomen pain has improved this morning.  Will start on soft diet.  He had a  bowel movement today.  Chronic alcohol consumption: Start on thiamine and folic acid .Monitor for withdrawal.  Counseled for cessation  Hypokalemia: Severe.  Magnesium level normal.  Continue supplementation.  Nephrology consulted for persistent hypokalemia. Potassium level normal today.  Heart failure with preserved ejection fraction:  Echocardiogram done on 05/30/2018 showed ejection fraction of 65%, diastolic dysfunction.  He was admitted on 05/29/2018 and was discharged on 06/03/2018 for management of  diastolic CHF.On Bumex at home.  Chronic pain syndrome: History of fall with the development of chronic pain syndrome.  Follows at [facility] regional pain clinic.  On fentanyl-bupivacaine via intrathecal pump.  Hyponatremia: Resolved  OSA: Noncompliant with CPAP.  Morbid obesity: BMI of 54.3.     Discharge Diagnoses:  Principal Problem:   Acute alcoholic pancreatitis Active Problems:   Gastroesophageal reflux disease without esophagitis   Hypokalemia   Hyponatremia   Chronic diastolic CHF (congestive heart failure) [facility])    Discharge Instructions  Discharge Instructions    Diet - low sodium heart healthy   Complete by:  As directed    Discharge instructions   Complete by:  As directed    1)Please take prescribed medications as instructed. 2)Please stop alcohol consumption. 3)Follow up with your PCP in a week and do a BMP test to check your potassium level. If you potassium is still low, you need to continue potassium supplements as per your PCP. 4)Take soft diet for next 2-3 days.   Increase activity slowly   Complete by:  As directed      Allergies as of 06/14/2018      Reactions   Tape Other (See Comments)   Blisters. Pt tolerates paper tape.       Medication List    STOP taking these medications   cloNIDine 0.1 mg/24hr patch Commonly known as:  CATAPRES - Dosed in mg/24 hr     TAKE these medications   aspirin 81 MG EC tablet Take 1 tablet (81 mg total) by mouth daily.  baclofen 10 MG tablet Commonly known as:  LIORESAL Take 1 tablet (10 mg total) by mouth 4 (four) times daily. Start taking on:  June 30, 2018   bumetanide 1 MG tablet Commonly known as:  BUMEX Take 1 tablet (1 mg total) by mouth 2 (two) times daily for 30 days.   esomeprazole 40 MG capsule Commonly known as:   NEXIUM Take 1 capsule (40 mg total) by mouth 2 (two) times daily. Start taking on:  June 30, 2018   folic acid 1 MG tablet Commonly known as:  FOLVITE Take 1 tablet (1 mg total) by mouth daily. Start taking on:  June 15, 2018   Gabapentin Enacarbil 600 MG Tbcr Commonly known as:  HORIZANT Take 1 tablet (600 mg total) by mouth 2 (two) times daily. Start taking on:  June 30, 2018   [facility] MILK OF MAGNESIA 400 MG/5ML suspension Generic drug:  magnesium hydroxide Take 5 mLs by mouth daily as needed for mild constipation.   hydrOXYzine 25 MG tablet Commonly known as:  ATARAX/VISTARIL Take 25 mg by mouth every 8 (eight) hours as needed for itching.   Magnesium Oxide 500 MG Caps Take 1 capsule (500 mg total) by mouth 2 (two) times daily at 8 am and 10 pm. Start taking on:  June 30, 2018   NON FORMULARY 959 mcg by Intrathecal route daily. IT pump Fentanyl 2,000.0 mcg/ml Bupivicaine 20.0 mg/ml 40 ml pump   ondansetron 8 MG disintegrating tablet Commonly known as:  ZOFRAN-ODT Take 1 tablet (8 mg total) by mouth every 8 (eight) hours as needed for nausea or vomiting. Start taking on:  June 30, 2018   Oxycodone HCl 10 MG Tabs Take 1 tablet (10 mg total) by mouth every 6 (six) hours as needed. Must last 30 days. Max: 4/day. What changed:  reasons to take this   polycarbophil 625 MG tablet Commonly known as:  FIBERCON Take 625 mg by mouth 3 (three) times daily.   Potassium Chloride ER 20 MEQ Tbcr Take 40 mEq by mouth daily for 7 days. Start taking on:  June 15, 2018   promethazine 25 MG tablet Commonly known as:  PHENERGAN Take 25 mg by mouth every 8 (eight) hours.   promethazine 25 MG suppository Commonly known as:  PHENERGAN Place 25 mg rectally every 6 (six) hours as needed for nausea/vomiting.   thiamine 100 MG tablet Take 1 tablet (100 mg total) by mouth daily. Start taking on:  June 15, 2018      Follow-up Information    [name], MD. Schedule an  appointment as soon as possible for a visit in 1 week(s).   Specialty:  Family Medicine Contact information: [phone] [address] [place] [address] [phone]          Allergies  Allergen Reactions  . Tape Other (See Comments)    Blisters. Pt tolerates paper tape.      Consultations: Nephrology  Procedures/Studies: Dg Chest 2 View  Result Date: 06/10/2018 CLINICAL DATA:  Vomiting.  Epigastric pain. EXAM: CHEST - 2 VIEW COMPARISON:  01/04/2015. FINDINGS: Stable cardiomegaly and pulmonary venous congestion. Low lung volumes. No focal alveolar infiltrate. No pleural effusion or pneumothorax. No acute bony abnormality. IMPRESSION: Stable cardiomegaly with pulmonary venous congestion. Low lung volumes. Electronically Signed   By: Maisie Fus  Register   On: 06/10/2018 11:20   Dg Chest 2 View  Result Date: 05/29/2018 CLINICAL DATA:  Generalized weakness, shortness of breath, multiple falls in last several months,  CHF, GERD EXAM: CHEST - 2 VIEW COMPARISON:  01/04/2015 FINDINGS: Enlargement of cardiac silhouette with pulmonary vascular congestion. Enlarged central pulmonary arteries. Mediastinal contours normal. Slight chronic accentuation of perihilar markings little changed. No definite acute infiltrate, pleural effusion or pneumothorax. Bones unremarkable. IMPRESSION: Enlargement of cardiac silhouette with pulmonary vascular congestion. Question pulmonary arterial hypertension. No acute abnormalities. Electronically Signed   By: Ulyses Southward M.D.   On: 05/29/2018 18:47   Ct Head Wo Contrast  Result Date: 05/29/2018 CLINICAL DATA:  Multiple recent falls, generalized weakness, visual changes and memory loss. EXAM: CT HEAD WITHOUT CONTRAST TECHNIQUE: Contiguous axial images were obtained from the base of the skull through the vertex without intravenous contrast. COMPARISON:  Head CT dated 12/22/2011. FINDINGS: Brain: Ventricles are normal in size. There is no mass, hemorrhage, edema  or other evidence of acute parenchymal abnormality. No extra-axial hemorrhage. Vascular: No hyperdense vessel or unexpected calcification. Skull: Normal. Negative for fracture or focal lesion. Sinuses/Orbits: No acute finding. Other: None. IMPRESSION: Negative head CT. No intracranial mass, hemorrhage or edema. Electronically Signed   By: Bary Richard M.D.   On: 05/29/2018 18:44   Ct Abdomen Pelvis W Contrast  Result Date: 06/10/2018 CLINICAL DATA:  Nausea and vomiting EXAM: CT ABDOMEN AND PELVIS WITH CONTRAST TECHNIQUE: Multidetector CT imaging of the abdomen and pelvis was performed using the standard protocol following bolus administration of intravenous contrast. CONTRAST:  OMNIPAQUE 300 COMPARISON:  None. FINDINGS: Lower chest: No acute abnormality. Hepatobiliary: Diffuse fatty infiltration of the liver is noted. The gallbladder is within normal limits. Pancreas: Pancreas is well visualized with mild peripancreatic inflammatory changes consistent with acute pancreatitis. No significant phlegmon is noted at this time. Spleen: Spleen is within normal limits. Adrenals/Urinary Tract: Adrenal glands are within normal limits. Kidneys are well visualized with a normal enhancement pattern. No calculi or obstructive changes are noted. The bladder is partially distended. Stomach/Bowel: Changes consistent with prior gastric sleeve surgery are noted. No obstructive or inflammatory changes of the small bowel or colon are noted. The appendix is within normal limits. Vascular/Lymphatic: No significant vascular findings are present. No enlarged abdominal or pelvic lymph nodes. Reproductive: Prostate is unremarkable. Other: No abdominal wall hernia or abnormality. No abdominopelvic ascites. Musculoskeletal: Postsurgical changes in the lumbar spine are noted. A infusion pump is noted extending into the thoracic spine. IMPRESSION: Changes consistent with early acute pancreatitis. No pseudocyst or significant phlegmon  is noted at this time. Electronically Signed   By: Alcide Clever M.D.   On: 06/10/2018 11:23   Vas [place] Lower Extremity Venous (dvt)  Result Date: 06/02/2018  Lower Venous Study Indications: Edema.  Limitations: Body habitus and poor ultrasound/tissue interface. Performing Technologist: Chanda Busing RVT  Examination Guidelines: A complete evaluation includes B-mode imaging, spectral Doppler, color Doppler, and power Doppler as needed of all accessible portions of each vessel. Bilateral testing is considered an integral part of a complete examination. Limited examinations for reoccurring indications may be performed as noted.  Right Venous Findings: +---------+---------------+---------+-----------+----------+--------------+          CompressibilityPhasicitySpontaneityPropertiesSummary        +---------+---------------+---------+-----------+----------+--------------+ CFV      Full           Yes      Yes                                 +---------+---------------+---------+-----------+----------+--------------+ SFJ      Full                                                        +---------+---------------+---------+-----------+----------+--------------+  FV Prox  Full                                                        +---------+---------------+---------+-----------+----------+--------------+ FV Mid   Full                                                        +---------+---------------+---------+-----------+----------+--------------+ FV Distal               Yes      Yes                                 +---------+---------------+---------+-----------+----------+--------------+ PFV      Full                                                        +---------+---------------+---------+-----------+----------+--------------+ POP      Full           Yes      Yes                                  +---------+---------------+---------+-----------+----------+--------------+ PTV      Full                                                        +---------+---------------+---------+-----------+----------+--------------+ PERO                                                  Not visualized +---------+---------------+---------+-----------+----------+--------------+  Left Venous Findings: +---------+---------------+---------+-----------+----------+--------------+          CompressibilityPhasicitySpontaneityPropertiesSummary        +---------+---------------+---------+-----------+----------+--------------+ CFV      Full           Yes      Yes                                 +---------+---------------+---------+-----------+----------+--------------+ SFJ      Full                                                        +---------+---------------+---------+-----------+----------+--------------+ FV Prox  Full                                                        +---------+---------------+---------+-----------+----------+--------------+  FV Mid                  Yes      Yes                                 +---------+---------------+---------+-----------+----------+--------------+ FV Distal               Yes      Yes                                 +---------+---------------+---------+-----------+----------+--------------+ PFV      Full                                                        +---------+---------------+---------+-----------+----------+--------------+ POP      Full           Yes      Yes                                 +---------+---------------+---------+-----------+----------+--------------+ PTV      Full                                                        +---------+---------------+---------+-----------+----------+--------------+ PERO                                                  Not visualized  +---------+---------------+---------+-----------+----------+--------------+    Summary: Right: There is no evidence of deep vein thrombosis in the lower extremity. However, portions of this examination were limited- see technologist comments above. No cystic structure found in the popliteal fossa. Left: There is no evidence of deep vein thrombosis in the lower extremity. However, portions of this examination were limited- see technologist comments above. No cystic structure found in the popliteal fossa.  *See table(s) above for measurements and observations. Electronically signed by Lemar Livings MD on 06/02/2018 at 2:10:13 PM.    Final        Subjective: Patient seen and examined the bedside this morning.  Remains comfortable.  Abdominal pain has significantly improved.  Stable for discharge to home.  Discharge Exam: Vitals:   06/13/18 2100 06/14/18 0300  BP: 131/72 131/88  Pulse: 67 73  Resp: 20 18  Temp: 98 F (36.7 C) 98.1 F (36.7 C)  SpO2: 99% 97%   Vitals:   06/13/18 0500 06/13/18 1440 06/13/18 2100 06/14/18 0300  BP:  (!) 130/59 131/72 131/88  Pulse:  83 67 73  Resp:  20 20 18   Temp:  97.8 F (36.6 C) 98 F (36.7 C) 98.1 F (36.7 C)  TempSrc:   Oral Oral  SpO2:  100% 99% 97%  Weight: (!) 174.3 kg   (!) 173 kg  Height:        General: Pt is alert, awake, not in acute distress,obese Cardiovascular: RRR, S1/S2 +,  no rubs, no gallops Respiratory: CTA bilaterally, no wheezing, no rhonchi Abdominal: Soft, NT, ND, bowel sounds + Extremities: no edema, no cyanosis    The results of significant diagnostics from this hospitalization (including imaging, microbiology, ancillary and laboratory) are listed below for reference.     Microbiology: No results found for this or any previous visit (from the past 240 hour(s)).   Labs: BNP (last 3 results) Recent Labs    05/29/18 1825 06/10/18 1038  BNP 155.4* 57.0   Basic Metabolic Panel: Recent Labs  Lab 06/10/18 1200   06/11/18 0542  06/12/18 0546 06/12/18 1240 06/12/18 2116 06/13/18 0552 06/13/18 1655 06/14/18 0607  NA 123*   < > 131*  --  133*  --  136 135 136 135  K 2.0*   < > 2.1*   < > 2.3*  --  2.9* 2.7* 3.9 4.0  CL 74*   < > 83*  --  89*  --  93* 95* 96* 100  CO2 36*   < > 34*  --  32  --  GLUCOSE 140*   < > 126*  --  109*  --  117* 109* 104* 128*  BUN 22*   < > 21*  --  15  --  CREATININE 1.01   < > 0.85  --  0.78  --  0.80 0.72 0.76 0.74  CALCIUM 9.2   < > 8.9  --  8.8*  --  8.8* 8.4* 9.1 8.7*  MG 2.6*  --  2.8*  --   --  2.7*  --   --   --   --    < > = values in this interval not displayed.   Liver Function Tests: Recent Labs  Lab 06/09/18 1733 06/10/18 1200 06/11/18 0542  AST ALT ALKPHOS 74 78 76  BILITOT 1.2 1.4* 1.2  PROT 8.7* 8.7* 8.3*  ALBUMIN 4.4 4.3 4.1   Recent Labs  Lab 06/09/18 1733 06/10/18 1200  LIPASE 141* 99*   No results for input(s): AMMONIA in the last 168 hours. CBC: Recent Labs  Lab 06/09/18 1733 06/10/18 1038 06/11/18 0542  WBC 11.7* 11.9* 9.3  NEUTROABS  --  9.6*  --   HGB 16.5 16.5 15.3  HCT 50.6 50.9 49.4  MCV 83.2 83.9 89.0  PLT 298 289 246   Cardiac Enzymes: Recent Labs  Lab 06/10/18 1038  TROPONINI 0.03*   BNP: Invalid input(s): POCBNP CBG: No results for input(s): GLUCAP in the last 168 hours. D-Dimer No results for input(s): DDIMER in the last 72 hours. Hgb A1c No results for input(s): HGBA1C in the last 72 hours. Lipid Profile No results for input(s): CHOL, HDL, LDLCALC, TRIG, CHOLHDL, LDLDIRECT in the last 72 hours. Thyroid function studies No results for input(s): TSH, T4TOTAL, T3FREE, THYROIDAB in the last 72 hours.  Invalid input(s): FREET3 Anemia work up No results for input(s): VITAMINB12, FOLATE, FERRITIN, TIBC, IRON, RETICCTPCT in the last 72 hours. Urinalysis    Component Value Date/Time   COLORURINE YELLOW 06/10/2018 1200   APPEARANCEUR CLEAR 06/10/2018 1200    LABSPEC <1.005 (L) 06/10/2018 1200   PHURINE 7.0 06/10/2018 1200   GLUCOSEU NEGATIVE 06/10/2018 1200   HGBUR NEGATIVE 06/10/2018 1200   BILIRUBINUR NEGATIVE 06/10/2018 1200   KETONESUR NEGATIVE 06/10/2018 1200   PROTEINUR NEGATIVE 06/10/2018 1200   UROBILINOGEN 0.2 08/13/2010 1206   NITRITE NEGATIVE 06/10/2018  1200   LEUKOCYTESUR NEGATIVE 06/10/2018 1200   Sepsis Labs Invalid input(s): PROCALCITONIN,  WBC,  LACTICIDVEN Microbiology No results found for this or any previous visit (from the past 240 hour(s)).  Please note: You were cared for by a hospitalist during your hospital stay. Once you are discharged, your primary care physician will handle any further medical issues. Please note that NO REFILLS for any discharge medications will be authorized once you are discharged, as it is imperative that you return to your primary care physician (or establish a relationship with a primary care physician if you do not have one) for your post hospital discharge needs so that they can reassess your need for medications and monitor your lab values.    Time coordinating discharge: 40 minutes  SIGNED:   Burnadette Pop, MD  [facility] 06/14/2018, 10:33 AM Pager [phone]  If 7PM-7AM, please contact night-coverage [email or web address] Password [email or web address]

## 2018-06-18 DIAGNOSIS — K852 Alcohol induced acute pancreatitis without necrosis or infection: Secondary | ICD-10-CM | POA: Insufficient documentation

## 2018-06-18 DIAGNOSIS — F1020 Alcohol dependence, uncomplicated: Secondary | ICD-10-CM | POA: Insufficient documentation

## 2018-06-21 ENCOUNTER — Other Ambulatory Visit: Payer: Self-pay | Admitting: Pain Medicine

## 2018-06-21 DIAGNOSIS — M62838 Other muscle spasm: Secondary | ICD-10-CM

## 2018-06-21 DIAGNOSIS — K219 Gastro-esophageal reflux disease without esophagitis: Secondary | ICD-10-CM

## 2018-06-21 DIAGNOSIS — M7918 Myalgia, other site: Principal | ICD-10-CM

## 2018-06-21 DIAGNOSIS — G8929 Other chronic pain: Secondary | ICD-10-CM

## 2018-06-27 NOTE — Progress Notes (Signed)
Patient's Name: [name]  MRN: [id number]  Referring Provider: Shirline Frees, MD  DOB: 06-Nov-1965  PCP: Antony Contras, MD  DOS: 06/30/2018  Note by: Gaspar Cola, MD  Service setting: Ambulatory outpatient  Specialty: Interventional Pain Management  Location: [facility] (AMB) Pain Management Facility    Patient type: Established   Primary Reason(s) for Visit: Encounter for prescription drug management. (Level of risk: moderate)  CC: Medication Refill and Back Pain  HPI  [name] is a 53 y.o. year old, male patient, who comes today for a medication management evaluation. He has DDD (degenerative disc disease), lumbosacral; S/P rotator cuff repair (Bilateral); Dyspnea; Severe obesity (BMI >= 40) ([place]); OSA complicated by mild polycythemia ; Polycythemia, secondary; S/P laparoscopic sleeve gastrectomy Sept 2016; Chronic pain syndrome; Long term prescription benzodiazepine use; Long term prescription opiate use; Oral Opiate use (60 MME/day); Long-term use of high-risk medication; Pharmacologic therapy; Disorder of skeletal system; Problems influencing health status; Chronic nausea s/p bariatric surgery; Presence of intrathecal pump; Encounter for interrogation of infusion pump; Chronic foot pain (Right); Chronic hip pain (Right); Old tear of medial meniscus of knee (Left); Failed back surgical syndrome; Chronic low back pain (Bilateral) w/ sciatica (Bilateral); Chronic lower extremity pain (Bilateral) (L>R); Chronic lower extremity radicular pain; Chronic shoulder pain (Bilateral); History of total knee replacement (Left); History of arthroscopic surgery of shoulder (Bilateral); Chronic musculoskeletal pain; Muscle spasticity; Gastroesophageal reflux disease without esophagitis; Neurogenic pain; Drug-induced nausea and vomiting; Therapeutic opioid-induced constipation (OIC); Chronic knee pain after total replacement (Left); Spondylosis without myelopathy or radiculopathy, lumbosacral region; Lumbar  facet syndrome (Bilateral) (R>L); Chronic low back pain (Primary Area of Pain) (Bilateral) (R>L); Lumbar spondylosis; Encounter for adjustment or management of infusion pump; Back pain; Morbid obesity with BMI of 50.0-59.9, adult ([place]); Secondary osteoarthritis of multiple sites; Osteoarthritis of hip (Right); Withdrawal from opioids ([place]); Acute CHF (congestive heart failure) ([place]); Acute pancreatitis; Acute alcoholic pancreatitis; Hypokalemia; Hyponatremia; Chronic diastolic CHF (congestive heart failure) ([place]); Obesity, morbid (more than 100 lbs over ideal weight or BMI > 40) (HCC); DDD (degenerative disc disease), thoracic; Osteoarthritis of lumbar spine; Alcoholism ([place]); Alcohol-induced acute pancreatitis without infection or necrosis; Nausea and vomiting; Abdominal pain; Encounter for screening colonoscopy; History of acute alcoholic pancreatitis; Hypogonadism in male; Vitamin D deficiency; Chronic hyperglycemia; and Prediabetes on their problem list. His primarily concern today is the Medication Refill and Back Pain  Pain Assessment: Location: Lower Back Radiating: Radiates from lower back to sacrum to back of legs bilateral to feet  Onset: More than a month ago Duration: Chronic pain Quality: Constant("Aggrevating" ) Severity: 5 /10 (subjective, self-reported pain score)  Note: Reported level is compatible with observation.                         When using our objective Pain Scale, levels between 6 and 10/10 are said to belong in an emergency room, as it progressively worsens from a 6/10, described as severely limiting, requiring emergency care not usually available at an outpatient pain management facility. At a 6/10 level, communication becomes difficult and requires great effort. Assistance to reach the emergency department may be required. Facial flushing and profuse sweating along with potentially dangerous increases in heart rate and blood pressure will be evident. Effect on ADL: "Affects  everything"  Timing: Constant Modifying factors: Denies BP: 128/85  HR: (!) 103  Mr. Ehle was last scheduled for an appointment on 06/21/2018 for medication management. During today's appointment we reviewed Mr.  Kuchera's chronic pain status, as well as his outpatient medication regimen.  Today we had a very long and candid conversation regarding his intrathecal pump increases.  I informed him of my concern that on his last visit, before he left the clinic and before he could tell whether or not our increase have helped, he was already asking to be scheduled for an order increase.  This made me believe that he was doing this automatically and was not thinking about it.  Today we also talked about his recent admission to the hospital due to the alcoholic pancreatitis.  Today I had the patient tell me what our plan was so that I could be sure that he understood what we had been talking about in terms of the nerve blocks and the medication management.  Clearly there were some gaps in his knowledge which today we corrected.  The patient  reports no history of drug use. His body mass index is 53.47 kg/m.  Further details on both, my assessment(s), as well as the proposed treatment plan, please see below.  Controlled Substance Pharmacotherapy Assessment REMS (Risk Evaluation and Mitigation Strategy)  Analgesic: Oxycodone IR 10 mg 1 tablet p.o. every 6 hours (40 mg/day of oxycodone) MME/day: 60 mg/day (from oral medication).  This MME does not include the intrathecal medication that the patient is getting continuously. Janne Napoleon, RN  06/30/2018  1:26 PM  Sign when Signing Visit Nursing Pain Medication Assessment:  Safety precautions to be maintained throughout the outpatient stay will include: orient to surroundings, keep bed in low position, maintain call bell within reach at all times, provide assistance with transfer out of bed and ambulation.  Medication Inspection Compliance: Pill count  conducted under aseptic conditions, in front of the patient. Neither the pills nor the bottle was removed from the patient's sight at any time. Once count was completed pills were immediately returned to the patient in their original bottle.  Medication: Oxycodone 33m Pill/Patch Count: 7 of 120 pills remain Pill/Patch Appearance: Markings consistent with prescribed medication Bottle Appearance: Standard pharmacy container. Clearly labeled. Filled Date: 02 / 20 / 2020 Last Medication intake:  Today   Pharmacokinetics: Liberation and absorption (onset of action): WNL Distribution (time to peak effect): WNL Metabolism and excretion (duration of action): WNL         Pharmacodynamics: Desired effects: Analgesia: Mr. HDowereports >50% benefit. Functional ability: Patient reports that medication allows him to accomplish basic ADLs Clinically meaningful improvement in function (CMIF): Sustained CMIF goals met Perceived effectiveness: Described as relatively effective, allowing for increase in activities of daily living (ADL) Undesirable effects: Side-effects or Adverse reactions: None reported Monitoring: [place] PMP: Online review of the past 169-montheriod conducted. Compliant with practice rules and regulations Last UDS on record: Summary  Date Value Ref Range Status  04/01/2018 FINAL  Final    Comment:    ==================================================================== TOXASSURE SELECT 13 (MW) ==================================================================== Test                             Result       Flag       Units Drug Present not Declared for Prescription Verification   Morphine                       1878         UNEXPECTED ng/mg creat    Potential sources of large amounts of morphine in  the absence of    codeine include administration of morphine or use of heroin. Drug Absent but Declared for Prescription Verification   Oxycodone                      Not Detected  UNEXPECTED ng/mg creat ==================================================================== Test                      Result    Flag   Units      Ref Range   Creatinine              109              mg/dL      >=20 ==================================================================== Declared Medications:  The flagging and interpretation on this report are based on the  following declared medications.  Unexpected results may arise from  inaccuracies in the declared medications.  **Note: The testing scope of this panel includes these medications:  Oxycodone  **Note: The testing scope of this panel does not include following  reported medications:  Baclofen  Calcium  Esomeprazole  Gabapentin  Magnesium Oxide  Ondansetron  Polyethylene Glycol (MiraLAX)  Supplement ==================================================================== For clinical consultation, please call [phone]. ====================================================================    UDS interpretation: Compliant          Medication Assessment Form: Reviewed. Patient indicates being compliant with therapy Treatment compliance: Compliant Risk Assessment Profile: Aberrant behavior: See initial evaluations. None observed or detected today Comorbid factors increasing risk of overdose: See initial evaluation. No additional risks detected today Opioid risk tool (ORT):  Opioid Risk  04/21/2018  Alcohol 0  Illegal Drugs 0  Rx Drugs 0  Alcohol 0  Illegal Drugs 0  Rx Drugs 0  Age between 16-45 years  0  History of Preadolescent Sexual Abuse 0  Psychological Disease 0  Depression 0  Opioid Risk Tool Scoring 0  Opioid Risk Interpretation Low Risk    ORT Scoring interpretation table:  Score <3 = Low Risk for SUD  Score between 4-7 = Moderate Risk for SUD  Score >8 = High Risk for Opioid Abuse   Risk of substance use disorder (SUD): Low  Risk Mitigation Strategies:  Patient Counseling:  Covered Patient-Prescriber Agreement (PPA): Present and active  Notification to other healthcare providers: Done  Pharmacologic Plan: No change in therapy, at this time.             Laboratory Chemistry  Inflammation Markers (CRP: Acute Phase) (ESR: Chronic Phase) Lab Results  Component Value Date   CRP 5 11/17/2017   ESRSEDRATE 41 (H) 11/17/2017                         Rheumatology Markers No results found.  Renal Function Markers Lab Results  Component Value Date   BUN 9 06/14/2018   CREATININE 0.74 06/14/2018   BCR 13 11/17/2017   GFRAA >60 06/14/2018   GFRNONAA >60 06/14/2018                             Hepatic Function Markers Lab Results  Component Value Date   AST 17 06/11/2018   ALT 17 06/11/2018   ALBUMIN 4.1 06/11/2018   ALKPHOS 76 06/11/2018   LIPASE 99 (H) 06/10/2018                        Electrolytes Lab Results  Component Value Date   NA 135 06/14/2018   K 4.0 06/14/2018   CL 100 06/14/2018   CALCIUM 8.7 (L) 06/14/2018   MG 2.7 (H) 06/12/2018                        Neuropathy Markers Lab Results  Component Value Date   VITAMINB12 342 11/17/2017   HIV Non Reactive 05/30/2018                        CNS Tests No results found.  Bone Pathology Markers Lab Results  Component Value Date   25OHVITD1 15 (L) 11/17/2017   25OHVITD2 <1.0 11/17/2017   25OHVITD3 15 11/17/2017                         Coagulation Parameters Lab Results  Component Value Date   INR 0.96 08/13/2010   LABPROT 13.0 08/13/2010   APTT 32 08/13/2010   PLT 246 06/11/2018   DDIMER 0.33 10/20/2014                        Cardiovascular Markers Lab Results  Component Value Date   BNP 57.0 06/10/2018   TROPONINI 0.03 (HH) 06/10/2018   HGB 15.3 06/11/2018   HCT 49.4 06/11/2018                         CA Markers No results found.  Endocrine Markers Lab Results  Component Value Date   TSH 1.708 06/11/2018                        Note: Lab results  reviewed.  Recent Diagnostic Imaging Results  CT ABDOMEN PELVIS W CONTRAST CLINICAL DATA:  Nausea and vomiting  EXAM: CT ABDOMEN AND PELVIS WITH CONTRAST  TECHNIQUE: Multidetector CT imaging of the abdomen and pelvis was performed using the standard protocol following bolus administration of intravenous contrast.  CONTRAST:  173m OMNIPAQUE 300  COMPARISON:  None.  FINDINGS: Lower chest: No acute abnormality.  Hepatobiliary: Diffuse fatty infiltration of the liver is noted. The gallbladder is within normal limits.  Pancreas: Pancreas is well visualized with mild peripancreatic inflammatory changes consistent with acute pancreatitis. No significant phlegmon is noted at this time.  Spleen: Spleen is within normal limits.  Adrenals/Urinary Tract: Adrenal glands are within normal limits. Kidneys are well visualized with a normal enhancement pattern. No calculi or obstructive changes are noted. The bladder is partially distended.  Stomach/Bowel: Changes consistent with prior gastric sleeve surgery are noted. No obstructive or inflammatory changes of the small bowel or colon are noted. The appendix is within normal limits.  Vascular/Lymphatic: No significant vascular findings are present. No enlarged abdominal or pelvic lymph nodes.  Reproductive: Prostate is unremarkable.  Other: No abdominal wall hernia or abnormality. No abdominopelvic ascites.  Musculoskeletal: Postsurgical changes in the lumbar spine are noted. A infusion pump is noted extending into the thoracic spine.  IMPRESSION: Changes consistent with early acute pancreatitis. No pseudocyst or significant phlegmon is noted at this time.  Electronically Signed   By: MInez CatalinaM.D.   On: 06/10/2018 11:23   DG Chest 2 View CLINICAL DATA:  Vomiting.  Epigastric pain.  EXAM: CHEST - 2 VIEW  COMPARISON:  01/04/2015.  FINDINGS: Stable cardiomegaly and pulmonary venous congestion. Low lung volumes.  No focal alveolar infiltrate. No  pleural effusion or pneumothorax. No acute bony abnormality.  IMPRESSION: Stable cardiomegaly with pulmonary venous congestion. Low lung volumes.  Electronically Signed   By: Marcello Moores  Register   On: 06/10/2018 11:20  Complexity Note: Imaging results reviewed. Results shared with Mr. Strohm, using Layman's terms.                               Meds   Current Outpatient Medications:  .  aspirin EC 81 MG EC tablet, Take 1 tablet (81 mg total) by mouth daily., Disp: 30 tablet, Rfl: 0 .  baclofen (LIORESAL) 10 MG tablet, Take 1 tablet (10 mg total) by mouth 4 (four) times daily., Disp: 120 tablet, Rfl: 1 .  bumetanide (BUMEX) 1 MG tablet, Take 1 tablet (1 mg total) by mouth 2 (two) times daily for 30 days., Disp: 60 tablet, Rfl: 5 .  escitalopram (LEXAPRO) 10 MG tablet, Take 10 mg by mouth daily., Disp: , Rfl:  .  esomeprazole (NEXIUM) 40 MG capsule, Take 1 capsule (40 mg total) by mouth 2 (two) times daily., Disp: 60 capsule, Rfl: 1 .  Gabapentin Enacarbil (HORIZANT) 600 MG TBCR, Take 1 tablet (600 mg total) by mouth 2 (two) times daily., Disp: 120 tablet, Rfl: 0 .  GNP MILK OF MAGNESIA 1200 MG/15ML suspension, Take 5 mLs by mouth daily as needed for mild constipation. , Disp: , Rfl:  .  Magnesium Oxide 500 MG CAPS, Take 1 capsule (500 mg total) by mouth 2 (two) times daily at 8 am and 10 pm., Disp: 60 capsule, Rfl: 1 .  NON FORMULARY, 959 mcg by Intrathecal route daily. IT pump Fentanyl 2,000.0 mcg/ml Bupivicaine 20.0 mg/ml 40 ml pump, Disp: , Rfl:  .  ondansetron (ZOFRAN-ODT) 8 MG disintegrating tablet, Take 1 tablet (8 mg total) by mouth every 8 (eight) hours as needed for nausea or vomiting., Disp: 180 tablet, Rfl: 0 .  Oxycodone HCl 10 MG TABS, Take 1 tablet (10 mg total) by mouth every 6 (six) hours as needed for up to 30 days. Must last 30 days. Max: 4/day., Disp: 120 tablet, Rfl: 0 .  [START ON 07/30/2018] Oxycodone HCl 10 MG TABS, Take 1 tablet (10 mg  total) by mouth every 6 (six) hours as needed for up to 30 days. Must last 30 days. Max: 4/day., Disp: 120 tablet, Rfl: 0 .  polycarbophil (FIBERCON) 625 MG tablet, Take 625 mg by mouth 3 (three) times daily. , Disp: , Rfl:  .  promethazine (PHENERGAN) 25 MG tablet, Take 25 mg by mouth every 8 (eight) hours., Disp: , Rfl:  .  folic acid (FOLVITE) 1 MG tablet, Take 1 tablet (1 mg total) by mouth daily. (Patient not taking: Reported on 06/30/2018), Disp: 30 tablet, Rfl: 0 .  hydrOXYzine (ATARAX/VISTARIL) 25 MG tablet, Take 25 mg by mouth every 8 (eight) hours as needed for itching. , Disp: , Rfl:  .  potassium chloride 20 MEQ TBCR, Take 40 mEq by mouth daily for 7 days., Disp: 14 tablet, Rfl: 0 .  promethazine (PHENERGAN) 25 MG suppository, Place 25 mg rectally every 6 (six) hours as needed for nausea/vomiting., Disp: , Rfl:  .  thiamine 100 MG tablet, Take 1 tablet (100 mg total) by mouth daily. (Patient not taking: Reported on 06/30/2018), Disp: 30 tablet, Rfl: 0  ROS  Constitutional: Denies any fever or chills Gastrointestinal: No reported hemesis, hematochezia, vomiting, or acute GI distress Musculoskeletal: Denies any acute onset  joint swelling, redness, loss of ROM, or weakness Neurological: No reported episodes of acute onset apraxia, aphasia, dysarthria, agnosia, amnesia, paralysis, loss of coordination, or loss of consciousness  Allergies  Mr. Stein is allergic to tape.  PFSH  Drug: Mr. Nawrot  reports no history of drug use. Alcohol:  reports current alcohol use. Tobacco:  reports that he has never smoked. His smokeless tobacco use includes snuff. Medical:  has a past medical history of Arthritis, Asthma, Back pain, CHF (congestive heart failure) ([place]), GERD (gastroesophageal reflux disease), Hernia, History of hiatal hernia, Hypogonadism in male, Neuromuscular disorder ([place]), PONV (postoperative nausea and vomiting), Shortness of breath dyspnea, Sleep apnea, Ulcer, and Wears  glasses. Surgical: Mr. Fanelli  has a past surgical history that includes Back surgery; morphine pump; Tonsillectomy; Joint replacement; Shoulder arthroscopy with subacromial decompression, rotator cuff repair and bicep tendon repair (Right, 08/18/2013); Shoulder arthroscopy with subacromial decompression and bicep tendon repair (Left, 10/06/2013); and Laparoscopic gastric sleeve resection (N/A, 01/08/2015). Family: family history includes Diabetes in his mother; Pneumonia in his father.  Constitutional Exam  General appearance: Well nourished, well developed, and well hydrated. In no apparent acute distress Vitals:   06/30/18 1313  BP: 128/85  Pulse: (!) 103  Temp: 98.4 F (36.9 C)  SpO2: 100%  Weight: (!) 378 lb (171.5 kg)  Height: 5' 10.5" (1.791 m)   BMI Assessment: Estimated body mass index is 53.47 kg/m as calculated from the following:   Height as of this encounter: 5' 10.5" (1.791 m).   Weight as of this encounter: 378 lb (171.5 kg).  BMI interpretation table: BMI level Category Range association with higher incidence of chronic pain  <18 kg/m2 Underweight   18.5-24.9 kg/m2 Ideal body weight   25-29.9 kg/m2 Overweight Increased incidence by 20%  30-34.9 kg/m2 Obese (Class I) Increased incidence by 68%  35-39.9 kg/m2 Severe obesity (Class II) Increased incidence by 136%  >40 kg/m2 Extreme obesity (Class III) Increased incidence by 254%   Patient's current BMI Ideal Body weight  Body mass index is 53.47 kg/m. Ideal body weight: 74.1 kg (163 lb 7.5 oz) Adjusted ideal body weight: 113.1 kg (249 lb 4.5 oz)   BMI Readings from Last 4 Encounters:  06/30/18 53.47 kg/m  06/14/18 53.95 kg/m  06/08/18 54.63 kg/m  06/03/18 56.64 kg/m   Wt Readings from Last 4 Encounters:  06/30/18 (!) 378 lb (171.5 kg)  06/14/18 (!) 381 lb 6.3 oz (173 kg)  06/08/18 (!) 386 lb 3.2 oz (175.2 kg)  06/03/18 (!) 400 lb 6.4 oz (181.6 kg)  Psych/Mental status: Alert, oriented x 3 (person, place, &  time)       Eyes: PERLA Respiratory: No evidence of acute respiratory distress  Cervical Spine Area Exam  Skin & Axial Inspection: No masses, redness, edema, swelling, or associated skin lesions Alignment: Symmetrical Functional ROM: Unrestricted ROM      Stability: No instability detected Muscle Tone/Strength: Functionally intact. No obvious neuro-muscular anomalies detected. Sensory (Neurological): Unimpaired Palpation: No palpable anomalies              Upper Extremity (UE) Exam    Side: Right upper extremity  Side: Left upper extremity  Skin & Extremity Inspection: Skin color, temperature, and hair growth are WNL. No peripheral edema or cyanosis. No masses, redness, swelling, asymmetry, or associated skin lesions. No contractures.  Skin & Extremity Inspection: Skin color, temperature, and hair growth are WNL. No peripheral edema or cyanosis. No masses, redness, swelling, asymmetry, or associated skin lesions. No  contractures.  Functional ROM: Unrestricted ROM          Functional ROM: Unrestricted ROM          Muscle Tone/Strength: Functionally intact. No obvious neuro-muscular anomalies detected.  Muscle Tone/Strength: Functionally intact. No obvious neuro-muscular anomalies detected.  Sensory (Neurological): Unimpaired          Sensory (Neurological): Unimpaired          Palpation: No palpable anomalies              Palpation: No palpable anomalies              Provocative Test(s):  Phalen's test: deferred Tinel's test: deferred Apley's scratch test (touch opposite shoulder):  Action 1 (Across chest): deferred Action 2 (Overhead): deferred Action 3 (LB reach): deferred   Provocative Test(s):  Phalen's test: deferred Tinel's test: deferred Apley's scratch test (touch opposite shoulder):  Action 1 (Across chest): deferred Action 2 (Overhead): deferred Action 3 (LB reach): deferred    Thoracic Spine Area Exam  Skin & Axial Inspection: No masses, redness, or swelling Alignment:  Symmetrical Functional ROM: Unrestricted ROM Stability: No instability detected Muscle Tone/Strength: Functionally intact. No obvious neuro-muscular anomalies detected. Sensory (Neurological): Unimpaired Muscle strength & Tone: No palpable anomalies  Lumbar Spine Area Exam  Skin & Axial Inspection: No masses, redness, or swelling Alignment: Symmetrical Functional ROM: Unrestricted ROM       Stability: No instability detected Muscle Tone/Strength: Functionally intact. No obvious neuro-muscular anomalies detected. Sensory (Neurological): Unimpaired Palpation: No palpable anomalies       Provocative Tests: Hyperextension/rotation test: deferred today       Lumbar quadrant test (Kemp's test): deferred today       Lateral bending test: deferred today       Patrick's Maneuver: deferred today                   FABER* test: deferred today                   S-I anterior distraction/compression test: deferred today         S-I lateral compression test: deferred today         S-I Thigh-thrust test: deferred today         S-I Gaenslen's test: deferred today         *(Flexion, ABduction and External Rotation)  Gait & Posture Assessment  Ambulation: Limited Gait: Antalgic Posture: Antalgic   Lower Extremity Exam    Side: Right lower extremity  Side: Left lower extremity  Stability: No instability observed          Stability: No instability observed          Skin & Extremity Inspection: Skin color, temperature, and hair growth are WNL. No peripheral edema or cyanosis. No masses, redness, swelling, asymmetry, or associated skin lesions. No contractures.  Skin & Extremity Inspection: Skin color, temperature, and hair growth are WNL. No peripheral edema or cyanosis. No masses, redness, swelling, asymmetry, or associated skin lesions. No contractures.  Functional ROM: Unrestricted ROM                  Functional ROM: Unrestricted ROM                  Muscle Tone/Strength: Functionally intact. No  obvious neuro-muscular anomalies detected.  Muscle Tone/Strength: Functionally intact. No obvious neuro-muscular anomalies detected.  Sensory (Neurological): Unimpaired        Sensory (Neurological): Unimpaired  DTR: Patellar: deferred today Achilles: deferred today Plantar: deferred today  DTR: Patellar: deferred today Achilles: deferred today Plantar: deferred today  Palpation: No palpable anomalies  Palpation: No palpable anomalies   Assessment   Status Diagnosis  Unimproved Persistent Persistent 1. Chronic low back pain (Primary Area of Pain) (Bilateral) (R>L)   2. Osteoarthritis of lumbar spine   3. Lumbar facet syndrome (Bilateral) (R>L)   4. Chronic lower extremity pain (Bilateral) (L>R)   5. Failed back surgical syndrome   6. Chronic pain syndrome   7. Chronic musculoskeletal pain   8. Muscle spasticity   9. Neurogenic pain   10. Problems influencing health status   11. Morbid obesity with BMI of 50.0-59.9, adult ([place])   12. Obesity, morbid (more than 100 lbs over ideal weight or BMI > 41) (HCC)   13. Gastroesophageal reflux disease without esophagitis   14. Secondary osteoarthritis of multiple sites   15. Chronic nausea s/p bariatric surgery   16. Pharmacologic therapy   17. Presence of intrathecal pump   18. History of acute alcoholic pancreatitis   19. Hypogonadism in male   46. Vitamin D deficiency   21. Disorder of skeletal system   22. Chronic hyperglycemia   23. Prediabetes      Updated Problems: Problem  Ddd (Degenerative Disc Disease), Thoracic  Osteoarthritis of lumbar spine  Abdominal Pain  Back Pain  History of total knee replacement (Left)  Obesity, Morbid (More Than 100 Lbs Over Ideal Weight Or Bmi > 40) (Hcc)  History of acute alcoholic pancreatitis  Hypogonadism in Male  Vitamin D Deficiency  Chronic Hyperglycemia  Prediabetes  Alcoholism (Hcc)  Alcohol-Induced Acute Pancreatitis Without Infection Or Necrosis  Acute Alcoholic  Pancreatitis  Hypokalemia  Hyponatremia  Nausea and Vomiting  Oral Opiate use (60 MME/day)  Chronic Diastolic Chf (Congestive Heart Failure) (Hcc)  Acute Pancreatitis  Encounter for Screening Colonoscopy  Acute Chf (Congestive Heart Failure) (Hcc)    Plan of Care  Pharmacotherapy (Medications Ordered): Meds ordered this encounter  Medications  . baclofen (LIORESAL) 10 MG tablet    Sig: Take 1 tablet (10 mg total) by mouth 4 (four) times daily.    Dispense:  120 tablet    Refill:  1    Do not place medication on "Automatic Refill". Fill one day early if pharmacy is closed on scheduled refill date.  . ondansetron (ZOFRAN-ODT) 8 MG disintegrating tablet    Sig: Take 1 tablet (8 mg total) by mouth every 8 (eight) hours as needed for nausea or vomiting.    Dispense:  180 tablet    Refill:  0    Do not place medication on "Automatic Refill". Fill one day early if pharmacy is closed on scheduled refill date.  . Oxycodone HCl 10 MG TABS    Sig: Take 1 tablet (10 mg total) by mouth every 6 (six) hours as needed for up to 30 days. Must last 30 days. Max: 4/day.    Dispense:  120 tablet    Refill:  0    [place] STOP ACT - Not applicable to Chronic Pain Syndrome (G89.4) diagnosis. Fill one day early if pharmacy is closed on scheduled refill date. Do not fill until: 06/30/18. To last until: 07/30/18.  . Gabapentin Enacarbil (HORIZANT) 600 MG TBCR    Sig: Take 1 tablet (600 mg total) by mouth 2 (two) times daily.    Dispense:  120 tablet    Refill:  0    Do not place medication  on "Automatic Refill". Fill one day early if pharmacy is closed on scheduled refill date.  . Oxycodone HCl 10 MG TABS    Sig: Take 1 tablet (10 mg total) by mouth every 6 (six) hours as needed for up to 30 days. Must last 30 days. Max: 4/day.    Dispense:  120 tablet    Refill:  0    [place] STOP ACT - Not applicable to Chronic Pain Syndrome (G89.4) diagnosis. Fill one day early if pharmacy is closed on scheduled refill date.  Do not fill until: 07/30/18. To last until: 08/29/18.   Medications administered today: Jeneen Rinks B. Prabhu had no medications administered during this visit.  Orders:  Orders Placed This Encounter  Procedures  . PUMP REPROGRAM    Follow programming protocol by having two(2) healthcare providers present during programming.    Scheduling Instructions:     Please perform the following adjustment: Increase rate by 20%.    Order Specific Question:   Where will this procedure be performed?    Answer:   [facility]  . ToxASSURE Select 13 (MW), Urine    Volume: 30 ml(s). Minimum 3 ml of urine is needed. Document temperature of fresh sample. Indications: Long term (current) use of opiate analgesic (Z79.891)  . 25-Hydroxyvitamin D Lcms D2+D3    Indication: Disorder of skeletal system (M89.9).    Order Specific Question:   CC Results    Answer:   PCP-NURSE [[id number]]  . Hemoglobin A1c    Order Specific Question:   CC Results    Answer:   PCP-NURSE [[id number]]  . Testosterone, Free, Total, SHBG    Order Specific Question:   CC Results    Answer:   PCP-NURSE [[id number]]  . Ambulatory referral to Endocrinology    Referral Priority:   Routine    Referral Type:   Consultation    Referral Reason:   Specialty Services Required    Requested Specialty:   Endocrinology    Number of Visits Requested:   1  . Ambulatory referral to Psychology    Referral Priority:   Routine    Referral Type:   Psychiatric    Referral Reason:   Specialty Services Required    Requested Specialty:   Psychology    Number of Visits Requested:   1  . Ambulatory referral to Gastroenterology    Referral Priority:   Routine    Referral Type:   Consultation    Referral Reason:   Specialty Services Required    Number of Visits Requested:   1    Lab Orders     ToxASSURE Select 13 (MW), Urine     25-Hydroxyvitamin D Lcms D2+D3     Hemoglobin A1c     Testosterone, Free, Total, SHBG Imaging Orders  No imaging studies  ordered today    Referral Orders     Ambulatory referral to Endocrinology     Ambulatory referral to Psychology     Ambulatory referral to Gastroenterology Planned follow-up:   Return for Procedure (w/ sedation): (B) LFCT BLK #2 (NO STEROIDS).  Today we will increase the intrathecal pump rate by 20%.  Refills were provided to the patient for 60 days.   Interventional management options: Considering:   Diagnostic bilateral lumbar facet block #2 Palliative/therapeuticintrathecal pump refill and maintenance Diagnostic left Genicular nerve block Possible left Genicular nerve RFA Possible bilateral lumbar facet RFA(Once his BMI is <35) Diagnostic caudal epidural steroid injection+epidurogram Possible Racz procedure Diagnostic bilateral intra-articular shoulder joint  injection Diagnostic bilateral suprascapular nerve block Possible bilateral suprascapular nerve RFA Diagnostic interlaminar ESI versus transforaminal ESI   Palliative PRN treatment(s):   Palliative/therapeuticintrathecal pump refill and maintenance Diagnostic bilateral lumbar facet blocks   Future Appointments  Date Time Provider [place]  07/12/2018 11:00 AM Georgina Peer, RD [facility] None  07/15/2018  9:45 AM Milinda Pointer, MD [facility] None  07/26/2018  8:00 PM [facility]  07/27/2018 11:00 AM Milinda Pointer, MD [facility] None  08/04/2018 11:00 AM Elouise Munroe, MD [facility]   Primary Care Physician: Antony Contras, MD Location: [facility] Note by: Gaspar Cola, MD Date: 06/30/2018; Time: 3:46 PM

## 2018-06-30 ENCOUNTER — Other Ambulatory Visit: Payer: Self-pay

## 2018-06-30 ENCOUNTER — Encounter: Payer: Self-pay | Admitting: Pain Medicine

## 2018-06-30 ENCOUNTER — Ambulatory Visit: Payer: Medicare Other | Attending: Pain Medicine | Admitting: Pain Medicine

## 2018-06-30 VITALS — BP 128/85 | HR 103 | Temp 98.4°F | Ht 70.5 in | Wt 378.0 lb

## 2018-06-30 DIAGNOSIS — E559 Vitamin D deficiency, unspecified: Secondary | ICD-10-CM

## 2018-06-30 DIAGNOSIS — M961 Postlaminectomy syndrome, not elsewhere classified: Secondary | ICD-10-CM | POA: Diagnosis not present

## 2018-06-30 DIAGNOSIS — Z8719 Personal history of other diseases of the digestive system: Secondary | ICD-10-CM | POA: Insufficient documentation

## 2018-06-30 DIAGNOSIS — R739 Hyperglycemia, unspecified: Secondary | ICD-10-CM

## 2018-06-30 DIAGNOSIS — M79604 Pain in right leg: Secondary | ICD-10-CM

## 2018-06-30 DIAGNOSIS — M545 Low back pain, unspecified: Secondary | ICD-10-CM

## 2018-06-30 DIAGNOSIS — R110 Nausea: Secondary | ICD-10-CM

## 2018-06-30 DIAGNOSIS — M7918 Myalgia, other site: Secondary | ICD-10-CM | POA: Diagnosis present

## 2018-06-30 DIAGNOSIS — M79605 Pain in left leg: Secondary | ICD-10-CM | POA: Diagnosis present

## 2018-06-30 DIAGNOSIS — G8929 Other chronic pain: Secondary | ICD-10-CM | POA: Diagnosis present

## 2018-06-30 DIAGNOSIS — M153 Secondary multiple arthritis: Secondary | ICD-10-CM

## 2018-06-30 DIAGNOSIS — R7303 Prediabetes: Secondary | ICD-10-CM | POA: Diagnosis present

## 2018-06-30 DIAGNOSIS — M47816 Spondylosis without myelopathy or radiculopathy, lumbar region: Secondary | ICD-10-CM

## 2018-06-30 DIAGNOSIS — Z79899 Other long term (current) drug therapy: Secondary | ICD-10-CM

## 2018-06-30 DIAGNOSIS — K219 Gastro-esophageal reflux disease without esophagitis: Secondary | ICD-10-CM | POA: Diagnosis present

## 2018-06-30 DIAGNOSIS — Z978 Presence of other specified devices: Secondary | ICD-10-CM | POA: Diagnosis present

## 2018-06-30 DIAGNOSIS — Z789 Other specified health status: Secondary | ICD-10-CM | POA: Diagnosis present

## 2018-06-30 DIAGNOSIS — Z6841 Body Mass Index (BMI) 40.0 and over, adult: Secondary | ICD-10-CM

## 2018-06-30 DIAGNOSIS — G894 Chronic pain syndrome: Secondary | ICD-10-CM

## 2018-06-30 DIAGNOSIS — M62838 Other muscle spasm: Secondary | ICD-10-CM | POA: Diagnosis present

## 2018-06-30 DIAGNOSIS — M792 Neuralgia and neuritis, unspecified: Secondary | ICD-10-CM

## 2018-06-30 DIAGNOSIS — M5134 Other intervertebral disc degeneration, thoracic region: Secondary | ICD-10-CM | POA: Insufficient documentation

## 2018-06-30 DIAGNOSIS — E291 Testicular hypofunction: Secondary | ICD-10-CM

## 2018-06-30 DIAGNOSIS — M899 Disorder of bone, unspecified: Secondary | ICD-10-CM

## 2018-06-30 MED ORDER — BACLOFEN 10 MG PO TABS: 10.0000 mg | ORAL_TABLET | Freq: Four times a day (QID) | ORAL | 1 refills | Status: DC

## 2018-06-30 MED ORDER — GABAPENTIN ENACARBIL ER 600 MG PO TBCR: 600.0000 mg | EXTENDED_RELEASE_TABLET | Freq: Two times a day (BID) | ORAL | 0 refills | Status: DC

## 2018-06-30 MED ORDER — OXYCODONE HCL 10 MG PO TABS: 10.0000 mg | ORAL_TABLET | Freq: Four times a day (QID) | ORAL | 0 refills | Status: DC | PRN

## 2018-06-30 MED ORDER — ONDANSETRON 8 MG PO TBDP: 8.0000 mg | ORAL_TABLET | Freq: Three times a day (TID) | ORAL | 0 refills | Status: DC | PRN

## 2018-06-30 NOTE — Patient Instructions (Signed)
____________________________________________________________________________________________  Preparing for Procedure with Sedation  Procedure appointments are limited to planned procedures: . No Prescription Refills. . No disability issues will be discussed. . No medication changes will be discussed.  Instructions: . Oral Intake: Do not eat or drink anything for at least 8 hours prior to your procedure. . Transportation: Public transportation is not allowed. Bring an adult driver. The driver must be physically present in our waiting room before any procedure can be started. . Physical Assistance: Bring an adult physically capable of assisting you, in the event you need help. This adult should keep you company at home for at least 6 hours after the procedure. . Blood Pressure Medicine: Take your blood pressure medicine with a sip of water the morning of the procedure. . Blood thinners: Notify our staff if you are taking any blood thinners. Depending on which one you take, there will be specific instructions on how and when to stop it. . Diabetics on insulin: Notify the staff so that you can be scheduled 1st case in the morning. If your diabetes requires high dose insulin, take only  of your normal insulin dose the morning of the procedure and notify the staff that you have done so. . Preventing infections: Shower with an antibacterial soap the morning of your procedure. . Build-up your immune system: Take 1000 mg of Vitamin C with every meal (3 times a day) the day prior to your procedure. . Antibiotics: Inform the staff if you have a condition or reason that requires you to take antibiotics before dental procedures. . Pregnancy: If you are pregnant, call and cancel the procedure. . Sickness: If you have a cold, fever, or any active infections, call and cancel the procedure. . Arrival: You must be in the facility at least 30 minutes prior to your scheduled procedure. . Children: Do not bring  children with you. . Dress appropriately: Bring dark clothing that you would not mind if they get stained. . Valuables: Do not bring any jewelry or valuables.  Reasons to call and reschedule or cancel your procedure: (Following these recommendations will minimize the risk of a serious complication.) . Surgeries: Avoid having procedures within 2 weeks of any surgery. (Avoid for 2 weeks before or after any surgery). . Flu Shots: Avoid having procedures within 2 weeks of a flu shots or . (Avoid for 2 weeks before or after immunizations). . Barium: Avoid having a procedure within 7-10 days after having had a radiological study involving the use of radiological contrast. (Myelograms, Barium swallow or enema study). . Heart attacks: Avoid any elective procedures or surgeries for the initial 6 months after a "Myocardial Infarction" (Heart Attack). . Blood thinners: It is imperative that you stop these medications before procedures. Let us know if you if you take any blood thinner.  . Infection: Avoid procedures during or within two weeks of an infection (including chest colds or gastrointestinal problems). Symptoms associated with infections include: Localized redness, fever, chills, night sweats or profuse sweating, burning sensation when voiding, cough, congestion, stuffiness, runny nose, sore throat, diarrhea, nausea, vomiting, cold or Flu symptoms, recent or current infections. It is specially important if the infection is over the area that we intend to treat. . Heart and lung problems: Symptoms that may suggest an active cardiopulmonary problem include: cough, chest pain, breathing difficulties or shortness of breath, dizziness, ankle swelling, uncontrolled high or unusually low blood pressure, and/or palpitations. If you are experiencing any of these symptoms, cancel your procedure and contact   your primary care physician for an evaluation.  Remember:  Regular Business hours are:  Monday to Thursday  8:00 AM to 4:00 PM  Provider's Schedule: Milinda Pointer, MD:  Procedure days: Tuesday and Thursday 7:30 AM to 4:00 PM  Gillis Santa, MD:  Procedure days: Monday and Wednesday 7:30 AM to 4:00 PM ____________________________________________________________________________________________   ____________________________________________________________________________________________  Medication Rules  Purpose: To inform patients, and their family members, of our rules and regulations.  Applies to: All patients receiving prescriptions (written or electronic).  Pharmacy of record: Pharmacy where electronic prescriptions will be sent. If written prescriptions are taken to a different pharmacy, please inform the nursing staff. The pharmacy listed in the electronic medical record should be the one where you would like electronic prescriptions to be sent.  Electronic prescriptions: In compliance with the [place] (STOP) Act of 2017 (Session Lanny Cramp [phone]), effective April 14, 2018, all controlled substances must be electronically prescribed. Calling prescriptions to the pharmacy will cease to exist.  Prescription refills: Only during scheduled appointments. Applies to all prescriptions.  NOTE: The following applies primarily to controlled substances (Opioid* Pain Medications).   Patient's responsibilities: 1. Pain Pills: Bring all pain pills to every appointment (except for procedure appointments). 2. Pill Bottles: Bring pills in original pharmacy bottle. Always bring the newest bottle. Bring bottle, even if empty. 3. Medication refills: You are responsible for knowing and keeping track of what medications you take and those you need refilled. The day before your appointment: write a list of all prescriptions that need to be refilled. The day of the appointment: give the list to the admitting nurse. Prescriptions will be written only during  appointments. No prescriptions will be written on procedure days. If you forget a medication: it will not be "Called in", "Faxed", or "electronically sent". You will need to get another appointment to get these prescribed. No early refills. Do not call asking to have your prescription filled early. 4. Prescription Accuracy: You are responsible for carefully inspecting your prescriptions before leaving our office. Have the discharge nurse carefully go over each prescription with you, before taking them home. Make sure that your name is accurately spelled, that your address is correct. Check the name and dose of your medication to make sure it is accurate. Check the number of pills, and the written instructions to make sure they are clear and accurate. Make sure that you are given enough medication to last until your next medication refill appointment. 5. Taking Medication: Take medication as prescribed. When it comes to controlled substances, taking less pills or less frequently than prescribed is permitted and encouraged. Never take more pills than instructed. Never take medication more frequently than prescribed.  6. Inform other Doctors: Always inform, all of your healthcare providers, of all the medications you take. 7. Pain Medication from other Providers: You are not allowed to accept any additional pain medication from any other Doctor or Healthcare provider. There are two exceptions to this rule. (see below) In the event that you require additional pain medication, you are responsible for notifying us, as stated below. 8. Medication Agreement: You are responsible for carefully reading and following our Medication Agreement. This must be signed before receiving any prescriptions from our practice. Safely store a copy of your signed Agreement. Violations to the Agreement will result in no further prescriptions. (Additional copies of our Medication Agreement are available upon request.) 9. Laws, Rules,  & Regulations: All patients are expected to follow all Federal  and State Laws, Statutes, Rules, & Regulations. Ignorance of the Laws does not constitute a valid excuse. The use of any illegal substances is prohibited. 10. Adopted CDC guidelines & recommendations: Target dosing levels will be at or below 60 MME/day. Use of benzodiazepines** is not recommended.  Exceptions: There are only two exceptions to the rule of not receiving pain medications from other Healthcare Providers. 1. Exception #1 (Emergencies): In the event of an emergency (i.e.: accident requiring emergency care), you are allowed to receive additional pain medication. However, you are responsible for: As soon as you are able, call our office [phone], at any time of the day or night, and leave a message stating your name, the date and nature of the emergency, and the name and dose of the medication prescribed. In the event that your call is answered by a member of our staff, make sure to document and save the date, time, and the name of the person that took your information.  2. Exception #2 (Planned Surgery): In the event that you are scheduled by another doctor or dentist to have any type of surgery or procedure, you are allowed (for a period no longer than 30 days), to receive additional pain medication, for the acute post-op pain. However, in this case, you are responsible for picking up a copy of our "Post-op Pain Management for Surgeons" handout, and giving it to your surgeon or dentist. This document is available at our office, and does not require an appointment to obtain it. Simply go to our office during business hours (Monday-Thursday from 8:00 AM to 4:00 PM) (Friday 8:00 AM to 12:00 Noon) or if you have a scheduled appointment with us, prior to your surgery, and ask for it by name. In addition, you will need to provide us with your name, name of your surgeon, type of surgery, and date of procedure or surgery.  *Opioid  medications include: morphine, codeine, oxycodone, oxymorphone, hydrocodone, hydromorphone, meperidine, tramadol, tapentadol, buprenorphine, fentanyl, methadone. **Benzodiazepine medications include: diazepam (Valium), alprazolam (Xanax), clonazepam (Klonopine), lorazepam (Ativan), clorazepate (Tranxene), chlordiazepoxide (Librium), estazolam (Prosom), oxazepam (Serax), temazepam (Restoril), triazolam (Halcion) (Last updated: 06/11/2017) ____________________________________________________________________________________________   ____________________________________________________________________________________________  Medication Recommendations and Reminders  Applies to: All patients receiving prescriptions (written and/or electronic).  Medication Rules & Regulations: These rules and regulations exist for your safety and that of others. They are not flexible and neither are we. Dismissing or ignoring them will be considered "non-compliance" with medication therapy, resulting in complete and irreversible termination of such therapy. (See document titled "Medication Rules" for more details.) In all conscience, because of safety reasons, we cannot continue providing a therapy where the patient does not follow instructions.  Pharmacy of record:   Definition: This is the pharmacy where your electronic prescriptions will be sent.   We do not endorse any particular pharmacy.  You are not restricted in your choice of pharmacy.  The pharmacy listed in the electronic medical record should be the one where you want electronic prescriptions to be sent.  If you choose to change pharmacy, simply notify our nursing staff of your choice of new pharmacy.  Recommendations:  Keep all of your pain medications in a safe place, under lock and key, even if you live alone.   After you fill your prescription, take 1 week's worth of pills and put them away in a safe place. You should keep a separate,  properly labeled bottle for this purpose. The remainder should be kept in the original bottle. Use   this as your primary supply, until it runs out. Once it's gone, then you know that you have 1 week's worth of medicine, and it is time to come in for a prescription refill. If you do this correctly, it is unlikely that you will ever run out of medicine.  To make sure that the above recommendation works, it is very important that you make sure your medication refill appointments are scheduled at least 1 week before you run out of medicine. To do this in an effective manner, make sure that you do not leave the office without scheduling your next medication management appointment. Always ask the nursing staff to show you in your prescription , when your medication will be running out. Then arrange for the receptionist to get you a return appointment, at least 7 days before you run out of medicine. Do not wait until you have 1 or 2 pills left, to come in. This is very poor planning and does not take into consideration that we may need to cancel appointments due to bad weather, sickness, or emergencies affecting our staff.  "Partial Fill": If for any reason your pharmacy does not have enough pills/tablets to completely fill or refill your prescription, do not allow for a "partial fill". You will need a separate prescription to fill the remaining amount, which we will not provide. If the reason for the partial fill is your insurance, you will need to talk to the pharmacist about payment alternatives for the remaining tablets, but again, do not accept a partial fill.  Prescription refills and/or changes in medication(s):   Prescription refills, and/or changes in dose or medication, will be conducted only during scheduled medication management appointments. (Applies to both, written and electronic prescriptions.)  No refills on procedure days. No medication will be changed or started on procedure days. No changes,  adjustments, and/or refills will be conducted on a procedure day. Doing so will interfere with the diagnostic portion of the procedure.  No phone refills. No medications will be "called into the pharmacy".  No Fax refills.  No weekend refills.  No Holliday refills.  No after hours refills.  Remember:  Business hours are:  Monday to Thursday 8:00 AM to 4:00 PM Provider's Schedule: Crystal King, NP - Appointments are:  Medication management: Monday to Thursday 8:00 AM to 4:00 PM Raul Winterhalter, MD - Appointments are:  Medication management: Monday and Wednesday 8:00 AM to 4:00 PM Procedure day: Tuesday and Thursday 7:30 AM to 4:00 PM Bilal Lateef, MD - Appointments are:  Medication management: Tuesday and Thursday 8:00 AM to 4:00 PM Procedure day: Monday and Wednesday 7:30 AM to 4:00 PM (Last update: 06/11/2017) ____________________________________________________________________________________________   ____________________________________________________________________________________________  CANNABIDIOL (AKA: CBD Oil or Pills)  Applies to: All patients receiving prescriptions of controlled substances (written and/or electronic).  General Information: Cannabidiol (CBD) was discovered in 1940. It is one of some 113 identified cannabinoids in cannabis (Marijuana) plants, accounting for up to 40% of the plant's extract. As of 2018, preliminary clinical research on cannabidiol included studies of anxiety, cognition, movement disorders, and pain.  Cannabidiol is consummed in multiple ways, including inhalation of cannabis smoke or vapor, as an aerosol spray into the cheek, and by mouth. It may be supplied as CBD oil containing CBD as the active ingredient (no added tetrahydrocannabinol (THC) or terpenes), a full-plant CBD-dominant hemp extract oil, capsules, dried cannabis, or as a liquid solution. CBD is thought not have the same psychoactivity as THC, and may affect the   actions  of THC. Studies suggest that CBD may interact with different biological targets, including cannabinoid receptors and other neurotransmitter receptors. As of 2018 the mechanism of action for its biological effects has not been determined.  In the [place], cannabidiol has a limited approval by the Food and Drug Administration (FDA) for treatment of only two types of epilepsy disorders. The side effects of long-term use of the drug include somnolence, decreased appetite, diarrhea, fatigue, malaise, weakness, sleeping problems, and others.  CBD remains a Schedule I drug prohibited for any use.  Legality: Some manufacturers ship CBD products nationally, an illegal action which the FDA has not enforced in 2018, with CBD remaining the subject of an FDA investigational new drug evaluation, and is not considered legal as a dietary supplement or food ingredient as of December 2018. Federal illegality has made it difficult historically to conduct research on CBD. CBD is openly sold in head shops and health food stores in some states where such sales have not been explicitly legalized.  Warning: Because it is not FDA approved for general use or treatment of pain, it is not required to undergo the same manufacturing controls as prescription drugs.  This means that the available cannabidiol (CBD) may be contaminated with THC.  If this is the case, it will trigger a positive urine drug screen (UDS) test for cannabinoids (Marijuana).  Because a positive UDS for illicit substances is a violation of our medication agreement, your opioid analgesics (pain medicine) may be permanently discontinued. (Last update: 07/02/2017) ____________________________________________________________________________________________    ______________________________________________________________________________________________  Weight Management Required  URGENT: Your weight has been found to be adversely affecting your  health.  Dear Mr. Sires:  Your current Body mass index is 53.47 kg/m.Marland Kitchen Estimated body mass index is 53.47 kg/m as calculated from the following:   Height as of this encounter: 5' 10.5" (1.791 m).   Weight as of this encounter: 378 lb (171.5 kg).  Your last four (4) weight and BMI calculations are as follows: Wt Readings from Last 4 Encounters:  06/30/18 (!) 378 lb (171.5 kg)  06/14/18 (!) 381 lb 6.3 oz (173 kg)  06/08/18 (!) 386 lb 3.2 oz (175.2 kg)  06/03/18 (!) 400 lb 6.4 oz (181.6 kg)   BMI Readings from Last 4 Encounters:  06/30/18 53.47 kg/m  06/14/18 53.95 kg/m  06/08/18 54.63 kg/m  06/03/18 56.64 kg/m    Calculations estimate your ideal body weight to be: Ideal body weight: 74.1 kg (163 lb 7.5 oz) Adjusted ideal body weight: 113.1 kg (249 lb 4.5 oz)  Please use the table below to identify your weight category and associated incidence of chronic pain, secondary to your weight.  BMI interpretation table: BMI level Category Associated incidence of chronic pain  <18 kg/m2 Underweight   18.5-24.9 kg/m2 Ideal body weight   25-29.9 kg/m2 Overweight  20%  30-34.9 kg/m2 Obese (Class I)  68%  35-39.9 kg/m2 Severe obesity (Class II)  136%  >40 kg/m2 Extreme obesity (Class III)  254%   In addition: You will be considered "Morbidly Obese", if your BMI is above 30 and you have one or more of the following conditions which are known to be directly associated with obesity: 1.    Type 2 Diabetes (Which in turn can lead to cardiovascular diseases (CVD), stroke, peripheral vascular diseases (PVD), retinopathy, nephropathy, and neuropathy) 2.    Cardiovascular Disease (High Blood Pressure; Congestive Heart Failure; High Cholesterol; Coronary Artery Disease; Angina; or History of Heart Attacks) 3.  Breathing problems (Asthma; obesity-hypoventilation syndrome; obstructive sleep apnea; chronic inflammatory airway disease; reactive airway disease; or shortness of breath) 4.     Chronic kidney disease 5.    Liver disease (nonalcoholic fatty liver disease) 6.    High blood pressure 7.    Acid reflux (gastroesophageal reflux disease; heartburn) 8.    Osteoarthritis (OA) (with any of the following: hip pain; knee pain; and/or low back pain) 9.    Low back pain (Lumbar Facet Syndrome; and/or Degenerative Disc Disease) 10.  Hip pain (Osteoarthritis of hip) (For every 1 lbs of added body weight, there is a 2 lbs increase in pressure inside of each hip articulation. 1:2 mechanical relationship) 11.  Knee pain (Osteoarthritis of knee) (For every 1 lbs of added body weight, there is a 4 lbs increase in pressure inside of each knee articulation. 1:4 mechanical relationship) (patients with a BMI>30 kg/m2 were 6.8 times more likely to develop knee OA than normal-weight individuals) 12.  Cancer. Epidemiological studies have shown that obesity is a risk factor for: post-menopausal breast cancer; cancers of the endometrium, colon and kidney cancer; malignant adenomas of the oesophagus. Obese subjects have an approximately 1.5-3.5-fold increased risk of developing these cancers compared with normal-weight subjects, and it has been estimated that between 15 and 45% of these cancers can be attributed to overweight. More recent studies suggest that obesity may also increase the risk of other types of cancer, including pancreatic, hepatic and gallbladder cancer. (Ref: Obesity and cancer. Pischon T, Nthlings U, [employer] H. Proc Nutr Soc. 2008 May;67(2):128-45. doi: 10.1017/S0029665108006976.)  Recommendation: At this point it is urgent that you take a step back and concentrate in loosing weight. Dedicate 100% of your efforts on this task. Nothing else will improve your health more than bringing down your BMI to less than 30. Because most chronic pain patients do have difficulty exercising secondary to their pain, you must rely on proper nutrition and dieting in order to lose the weight. If your BMI is  above 40, you should seriously consider bariatric surgery. A realistic goal is to lose 10% of your body weight over a period of 12 months.  If over time you have unsuccessfully try to lose weight, then it is time for you to seek professional help and to enter a medically supervised weight management program.  Pain management considerations:  1.    Pharmacological Problems: Be advised that the use of opioid analgesics (oxycodone; hydrocodone; morphine; methadone; codeine; and all of their derivatives) have been associated with decreased metabolism and weight gain.  For this reason, should we see that you are unable to lose weight while taking these medications, it may become necessary for [place] to taper down and indefinitely discontinue them.  2.    Technical Problems: The incidence of successful interventional therapies decreases as the patient's BMI increases. It is much more difficult to accomplish a safe and effective interventional therapy on a patient with a BMI above 35. Yours is Body mass index is 53.47 kg/m.Marland Kitchen  3.    Radiation Exposure Problems: The x-rays machine, used to accomplish injection therapies, will automatically increase their x-ray output in order to capture an appropriate bone image. This means that radiation exposure increases exponentially with the patient's BMI. (The higher the BMI, the higher the radiation exposure.) Although the level of radiation used at a given time is still safe to the patient, it is not for the physician and/or assisting staff. Unfortunately, radiation exposure is accumulative. Because physicians and the  staff have to do procedures and be exposed on a daily basis, this can result in health problems such as cancer and radiation burns. Radiation exposure to the staff is monitored by the radiation batches that they wear. The exposure levels are reported back to the staff on a quarterly basis. Depending on levels of exposure, physicians and staff may be obligated by law  to decrease this exposure. This means that they have the right and obligation to refuse providing therapies where they may be overexposed to radiation. For this reason, physicians may decline to offer therapies such as radiofrequency ablation or implants to patients with a BMI above 40. 4.    Current Trends: Be advised that the current trend is to no longer offer certain therapies to patients with a BMI equal to, or above 35, due to increase perioperative risks, increased technical procedural difficulties, and excessive radiation exposure to healthcare personnel.  ______________________________________________________________________________________________

## 2018-06-30 NOTE — Progress Notes (Signed)
Nursing Pain Medication Assessment:  Safety precautions to be maintained throughout the outpatient stay will include: orient to surroundings, keep bed in low position, maintain call bell within reach at all times, provide assistance with transfer out of bed and ambulation.  Medication Inspection Compliance: Pill count conducted under aseptic conditions, in front of the patient. Neither the pills nor the bottle was removed from the patient's sight at any time. Once count was completed pills were immediately returned to the patient in their original bottle.  Medication: Oxycodone 10mg  Pill/Patch Count: 7 of 120 pills remain Pill/Patch Appearance: Markings consistent with prescribed medication Bottle Appearance: Standard pharmacy container. Clearly labeled. Filled Date: 02 / 20 / 2020 Last Medication intake:  Today

## 2018-07-01 ENCOUNTER — Other Ambulatory Visit: Payer: Self-pay

## 2018-07-01 MED ORDER — PAIN MANAGEMENT IT PUMP REFILL: 1.0000 | Freq: Once | INTRATHECAL | 0 refills | Status: AC

## 2018-07-04 LAB — TESTOSTERONE, FREE, TOTAL, SHBG
Sex Hormone Binding: 19.2 nmol/L — ABNORMAL LOW (ref 19.3–76.4)
Testosterone, Free: 3.1 pg/mL — ABNORMAL LOW (ref 7.2–24.0)
Testosterone: 22 ng/dL — ABNORMAL LOW (ref 264–916)

## 2018-07-04 LAB — HEMOGLOBIN A1C
Est. average glucose Bld gHb Est-mCnc: 140 mg/dL
Hgb A1c MFr Bld: 6.5 % — ABNORMAL HIGH (ref 4.8–5.6)

## 2018-07-04 LAB — 25-HYDROXY VITAMIN D LCMS D2+D3
25-Hydroxy, Vitamin D-2: <1 ng/mL
25-Hydroxy, Vitamin D-3: 6.1 ng/mL
25-Hydroxy, Vitamin D: 6.3 ng/mL — ABNORMAL LOW

## 2018-07-05 ENCOUNTER — Encounter: Payer: Self-pay | Admitting: Pain Medicine

## 2018-07-05 ENCOUNTER — Other Ambulatory Visit: Payer: Self-pay | Admitting: Internal Medicine

## 2018-07-05 LAB — TOXASSURE SELECT 13 (MW), URINE

## 2018-07-05 MED ORDER — BUMETANIDE 1 MG PO TABS: 1.0000 mg | ORAL_TABLET | Freq: Two times a day (BID) | ORAL | 5 refills | Status: DC

## 2018-07-05 NOTE — Telephone Encounter (Signed)
°*  STAT* If patient is at the pharmacy, call can be transferred to refill team.   1. Which medications need to be refilled? (please list name of each medication and dose if known)  A new prescription for Dumetanide 2. Which pharmacy/location (including street and city if local pharmacy) is medication to be sent to?[facility]- [phone] 3. Do they need a 30 day or 90 day supply? 60 and refills

## 2018-07-08 ENCOUNTER — Encounter: Payer: Self-pay | Admitting: Pain Medicine

## 2018-07-08 ENCOUNTER — Telehealth: Payer: Self-pay | Admitting: Pain Medicine

## 2018-07-08 DIAGNOSIS — G894 Chronic pain syndrome: Secondary | ICD-10-CM

## 2018-07-11 ENCOUNTER — Other Ambulatory Visit: Payer: Self-pay

## 2018-07-11 ENCOUNTER — Inpatient Hospital Stay (HOSPITAL_COMMUNITY)
Admission: EM | Admit: 2018-07-11 | Discharge: 2018-07-15 | DRG: 189 | Disposition: A | Discharge status: Home / Self Care | Payer: Medicare Other | Attending: Internal Medicine | Admitting: Internal Medicine

## 2018-07-11 ENCOUNTER — Encounter (HOSPITAL_COMMUNITY): Payer: Self-pay | Admitting: Nurse Practitioner

## 2018-07-11 ENCOUNTER — Emergency Department (HOSPITAL_COMMUNITY): Payer: Medicare Other

## 2018-07-11 DIAGNOSIS — K219 Gastro-esophageal reflux disease without esophagitis: Secondary | ICD-10-CM | POA: Diagnosis present

## 2018-07-11 DIAGNOSIS — Z96652 Presence of left artificial knee joint: Secondary | ICD-10-CM | POA: Diagnosis present

## 2018-07-11 DIAGNOSIS — Z6841 Body Mass Index (BMI) 40.0 and over, adult: Secondary | ICD-10-CM

## 2018-07-11 DIAGNOSIS — G9341 Metabolic encephalopathy: Secondary | ICD-10-CM | POA: Diagnosis present

## 2018-07-11 DIAGNOSIS — Z7982 Long term (current) use of aspirin: Secondary | ICD-10-CM | POA: Diagnosis not present

## 2018-07-11 DIAGNOSIS — Z03818 Encounter for observation for suspected exposure to other biological agents ruled out: Secondary | ICD-10-CM

## 2018-07-11 DIAGNOSIS — F17290 Nicotine dependence, other tobacco product, uncomplicated: Secondary | ICD-10-CM | POA: Diagnosis present

## 2018-07-11 DIAGNOSIS — J9602 Acute respiratory failure with hypercapnia: Secondary | ICD-10-CM | POA: Diagnosis not present

## 2018-07-11 DIAGNOSIS — J9601 Acute respiratory failure with hypoxia: Principal | ICD-10-CM | POA: Diagnosis present

## 2018-07-11 DIAGNOSIS — I5032 Chronic diastolic (congestive) heart failure: Secondary | ICD-10-CM | POA: Diagnosis present

## 2018-07-11 DIAGNOSIS — R509 Fever, unspecified: Secondary | ICD-10-CM | POA: Diagnosis not present

## 2018-07-11 DIAGNOSIS — B957 Other staphylococcus as the cause of diseases classified elsewhere: Secondary | ICD-10-CM | POA: Diagnosis present

## 2018-07-11 DIAGNOSIS — E291 Testicular hypofunction: Secondary | ICD-10-CM | POA: Diagnosis present

## 2018-07-11 DIAGNOSIS — R296 Repeated falls: Secondary | ICD-10-CM | POA: Diagnosis present

## 2018-07-11 DIAGNOSIS — Z9109 Other allergy status, other than to drugs and biological substances: Secondary | ICD-10-CM | POA: Diagnosis not present

## 2018-07-11 DIAGNOSIS — R4182 Altered mental status, unspecified: Secondary | ICD-10-CM | POA: Diagnosis not present

## 2018-07-11 DIAGNOSIS — M1990 Unspecified osteoarthritis, unspecified site: Secondary | ICD-10-CM | POA: Diagnosis present

## 2018-07-11 DIAGNOSIS — F1010 Alcohol abuse, uncomplicated: Secondary | ICD-10-CM | POA: Diagnosis present

## 2018-07-11 DIAGNOSIS — Z978 Presence of other specified devices: Secondary | ICD-10-CM | POA: Diagnosis not present

## 2018-07-11 DIAGNOSIS — R740 Nonspecific elevation of levels of transaminase and lactic acid dehydrogenase [LDH]: Secondary | ICD-10-CM | POA: Diagnosis present

## 2018-07-11 DIAGNOSIS — J45909 Unspecified asthma, uncomplicated: Secondary | ICD-10-CM | POA: Diagnosis present

## 2018-07-11 DIAGNOSIS — Z9114 Patient's other noncompliance with medication regimen: Secondary | ICD-10-CM

## 2018-07-11 DIAGNOSIS — Z9119 Patient's noncompliance with other medical treatment and regimen: Secondary | ICD-10-CM | POA: Diagnosis not present

## 2018-07-11 DIAGNOSIS — E662 Morbid (severe) obesity with alveolar hypoventilation: Secondary | ICD-10-CM | POA: Diagnosis present

## 2018-07-11 DIAGNOSIS — G253 Myoclonus: Secondary | ICD-10-CM | POA: Insufficient documentation

## 2018-07-11 DIAGNOSIS — R7881 Bacteremia: Secondary | ICD-10-CM | POA: Diagnosis present

## 2018-07-11 DIAGNOSIS — Z789 Other specified health status: Secondary | ICD-10-CM | POA: Diagnosis not present

## 2018-07-11 DIAGNOSIS — G894 Chronic pain syndrome: Secondary | ICD-10-CM | POA: Diagnosis present

## 2018-07-11 DIAGNOSIS — G4733 Obstructive sleep apnea (adult) (pediatric): Secondary | ICD-10-CM | POA: Diagnosis not present

## 2018-07-11 DIAGNOSIS — Z91048 Other nonmedicinal substance allergy status: Secondary | ICD-10-CM | POA: Diagnosis not present

## 2018-07-11 DIAGNOSIS — Z79899 Other long term (current) drug therapy: Secondary | ICD-10-CM

## 2018-07-11 DIAGNOSIS — G8929 Other chronic pain: Secondary | ICD-10-CM | POA: Diagnosis not present

## 2018-07-11 DIAGNOSIS — Z20828 Contact with and (suspected) exposure to other viral communicable diseases: Secondary | ICD-10-CM | POA: Diagnosis present

## 2018-07-11 LAB — CBC WITH DIFFERENTIAL/PLATELET
Abs Immature Granulocytes: 0.05 10*3/uL (ref 0.00–0.07)
BASOS ABS: 0 10*3/uL (ref 0.0–0.1)
Basophils Relative: 1 %
Eosinophils Absolute: 0.1 10*3/uL (ref 0.0–0.5)
Eosinophils Relative: 2 %
HCT: 46.2 % (ref 39.0–52.0)
Hemoglobin: 13.6 g/dL (ref 13.0–17.0)
Immature Granulocytes: 1 %
Lymphocytes Relative: 18 %
Lymphs Abs: 1.6 10*3/uL (ref 0.7–4.0)
MCH: 28 pg (ref 26.0–34.0)
MCHC: 29.4 g/dL — ABNORMAL LOW (ref 30.0–36.0)
MCV: 95.1 fL (ref 80.0–100.0)
Monocytes Absolute: 0.5 10*3/uL (ref 0.1–1.0)
Monocytes Relative: 6 %
NEUTROS ABS: 6.3 10*3/uL (ref 1.7–7.7)
Neutrophils Relative %: 72 %
Platelets: 201 10*3/uL (ref 150–400)
RBC: 4.86 MIL/uL (ref 4.22–5.81)
RDW: 16.1 % — AB (ref 11.5–15.5)
WBC: 8.6 10*3/uL (ref 4.0–10.5)
nRBC: 0.9 % — ABNORMAL HIGH (ref 0.0–0.2)

## 2018-07-11 LAB — COMPREHENSIVE METABOLIC PANEL
ALT: 24 U/L (ref 0–44)
AST: 22 U/L (ref 15–41)
Albumin: 3.6 g/dL (ref 3.5–5.0)
Alkaline Phosphatase: 86 U/L (ref 38–126)
Anion gap: 7 (ref 5–15)
BUN: 6 mg/dL (ref 6–20)
CHLORIDE: 101 mmol/L (ref 98–111)
CO2: 32 mmol/L (ref 22–32)
Calcium: 8.6 mg/dL — ABNORMAL LOW (ref 8.9–10.3)
Creatinine, Ser: 0.84 mg/dL (ref 0.61–1.24)
GFR calc Af Amer: >60 mL/min (ref >60)
GFR calc non Af Amer: >60 mL/min (ref >60)
Glucose, Bld: 140 mg/dL — ABNORMAL HIGH (ref 70–99)
POTASSIUM: 4.2 mmol/L (ref 3.5–5.1)
SODIUM: 140 mmol/L (ref 135–145)
Total Bilirubin: 0.7 mg/dL (ref 0.3–1.2)
Total Protein: 7.2 g/dL (ref 6.5–8.1)

## 2018-07-11 LAB — RAPID URINE DRUG SCREEN, HOSP PERFORMED
Amphetamines: NOT DETECTED
Barbiturates: NOT DETECTED
Benzodiazepines: NOT DETECTED
Cocaine: NOT DETECTED
Opiates: POSITIVE — AB
TETRAHYDROCANNABINOL: NOT DETECTED

## 2018-07-11 LAB — D-DIMER, QUANTITATIVE: D-Dimer, Quant: 1.06 ug/mL-FEU — ABNORMAL HIGH (ref 0.00–0.50)

## 2018-07-11 LAB — BRAIN NATRIURETIC PEPTIDE: B Natriuretic Peptide: 348.1 pg/mL — ABNORMAL HIGH (ref 0.0–100.0)

## 2018-07-11 LAB — BLOOD GAS, VENOUS
Acid-Base Excess: 8.1 mmol/L — ABNORMAL HIGH (ref 0.0–2.0)
Bicarbonate: 35.6 mmol/L — ABNORMAL HIGH (ref 20.0–28.0)
FIO2: 21
O2 Saturation: 63.4 %
Patient temperature: 98.6
pCO2, Ven: 64.6 mmHg — ABNORMAL HIGH (ref 44.0–60.0)
pH, Ven: 7.36 (ref 7.250–7.430)
pO2, Ven: 35.2 mmHg (ref 32.0–45.0)

## 2018-07-11 LAB — LIPASE, BLOOD: Lipase: 21 U/L (ref 11–51)

## 2018-07-11 LAB — URINALYSIS, ROUTINE W REFLEX MICROSCOPIC
Bilirubin Urine: NEGATIVE
Glucose, UA: NEGATIVE mg/dL
Hgb urine dipstick: NEGATIVE
KETONES UR: NEGATIVE mg/dL
Leukocytes,Ua: NEGATIVE
Nitrite: NEGATIVE
Protein, ur: NEGATIVE mg/dL
Specific Gravity, Urine: 1.009 (ref 1.005–1.030)
pH: 6 (ref 5.0–8.0)

## 2018-07-11 LAB — PHOSPHORUS: Phosphorus: 2.3 mg/dL — ABNORMAL LOW (ref 2.5–4.6)

## 2018-07-11 LAB — TSH: TSH: 0.938 u[IU]/mL (ref 0.350–4.500)

## 2018-07-11 LAB — MAGNESIUM: Magnesium: 2.1 mg/dL (ref 1.7–2.4)

## 2018-07-11 LAB — LACTIC ACID, PLASMA: LACTIC ACID, VENOUS: 2.2 mmol/L — AB (ref 0.5–1.9)

## 2018-07-11 LAB — ETHANOL: Alcohol, Ethyl (B): <10 mg/dL (ref <10)

## 2018-07-11 LAB — C-REACTIVE PROTEIN: CRP: 1.8 mg/dL — ABNORMAL HIGH (ref <1.0)

## 2018-07-11 LAB — PROTIME-INR
INR: 0.9 (ref 0.8–1.2)
Prothrombin Time: 12.1 seconds (ref 11.4–15.2)

## 2018-07-11 LAB — AMMONIA: AMMONIA: 50 umol/L — AB (ref 9–35)

## 2018-07-11 LAB — TROPONIN I: Troponin I: <0.03 ng/mL (ref <0.03)

## 2018-07-11 LAB — LACTATE DEHYDROGENASE: LDH: 234 U/L — ABNORMAL HIGH (ref 98–192)

## 2018-07-11 LAB — FERRITIN: Ferritin: 156 ng/mL (ref 24–336)

## 2018-07-11 MED ORDER — LORAZEPAM 1 MG PO TABS: 1.0000 mg | ORAL_TABLET | Freq: Four times a day (QID) | ORAL | Status: AC | PRN

## 2018-07-11 MED ORDER — LACTULOSE 10 GM/15ML PO SOLN
20.0000 g | Freq: Three times a day (TID) | ORAL | Status: DC
  Administered 2018-07-12 – 2018-07-15 (×10): 20 g via ORAL
  Filled 2018-07-11 (×10): qty 30

## 2018-07-11 MED ORDER — VITAMIN B-1 100 MG PO TABS
100.0000 mg | ORAL_TABLET | Freq: Every day | ORAL | Status: DC
  Administered 2018-07-12 – 2018-07-15 (×3): 100 mg via ORAL
  Filled 2018-07-11 (×4): qty 1

## 2018-07-11 MED ORDER — ADULT MULTIVITAMIN W/MINERALS CH
1.0000 | ORAL_TABLET | Freq: Every day | ORAL | Status: DC
  Administered 2018-07-12 – 2018-07-15 (×4): 1 via ORAL
  Filled 2018-07-11 (×4): qty 1

## 2018-07-11 MED ORDER — LORAZEPAM 2 MG/ML IJ SOLN: 1.0000 mg | Freq: Four times a day (QID) | INTRAMUSCULAR | Status: AC | PRN

## 2018-07-11 MED ORDER — ASPIRIN EC 81 MG PO TBEC
81.0000 mg | DELAYED_RELEASE_TABLET | Freq: Every day | ORAL | Status: DC
  Administered 2018-07-12 – 2018-07-15 (×4): 81 mg via ORAL
  Filled 2018-07-11 (×4): qty 1

## 2018-07-11 MED ORDER — BACLOFEN 10 MG PO TABS
5.0000 mg | ORAL_TABLET | Freq: Four times a day (QID) | ORAL | Status: DC
  Administered 2018-07-12 – 2018-07-15 (×14): 5 mg via ORAL
  Filled 2018-07-11 (×14): qty 1

## 2018-07-11 MED ORDER — FUROSEMIDE 10 MG/ML IJ SOLN
40.0000 mg | Freq: Two times a day (BID) | INTRAMUSCULAR | Status: DC
  Administered 2018-07-12 – 2018-07-15 (×7): 40 mg via INTRAVENOUS
  Filled 2018-07-11 (×7): qty 4

## 2018-07-11 MED ORDER — GABAPENTIN 300 MG PO CAPS
300.0000 mg | ORAL_CAPSULE | Freq: Three times a day (TID) | ORAL | Status: DC
  Administered 2018-07-12 – 2018-07-15 (×10): 300 mg via ORAL
  Filled 2018-07-11 (×10): qty 1

## 2018-07-11 MED ORDER — THIAMINE HCL 100 MG/ML IJ SOLN
100.0000 mg | Freq: Every day | INTRAMUSCULAR | Status: DC
  Administered 2018-07-13: 100 mg via INTRAVENOUS
  Filled 2018-07-11 (×2): qty 2

## 2018-07-11 MED ORDER — FOLIC ACID 1 MG PO TABS
1.0000 mg | ORAL_TABLET | Freq: Every day | ORAL | Status: DC
  Administered 2018-07-12 – 2018-07-15 (×4): 1 mg via ORAL
  Filled 2018-07-11 (×4): qty 1

## 2018-07-11 MED ORDER — MAGNESIUM OXIDE 400 (241.3 MG) MG PO TABS
400.0000 mg | ORAL_TABLET | Freq: Two times a day (BID) | ORAL | Status: DC
  Administered 2018-07-12 – 2018-07-15 (×7): 400 mg via ORAL
  Filled 2018-07-11 (×8): qty 1

## 2018-07-11 MED ORDER — ENOXAPARIN SODIUM 100 MG/ML ~~LOC~~ SOLN
90.0000 mg | SUBCUTANEOUS | Status: DC
  Administered 2018-07-11 – 2018-07-14 (×4): 90 mg via SUBCUTANEOUS
  Filled 2018-07-11 (×4): qty 1

## 2018-07-11 MED ORDER — FUROSEMIDE 10 MG/ML IJ SOLN
40.0000 mg | Freq: Once | INTRAMUSCULAR | Status: AC
  Administered 2018-07-11: 40 mg via INTRAVENOUS
  Filled 2018-07-11: qty 4

## 2018-07-11 MED ORDER — ACETAMINOPHEN 325 MG PO TABS
650.0000 mg | ORAL_TABLET | Freq: Once | ORAL | Status: AC
  Administered 2018-07-11: 650 mg via ORAL
  Filled 2018-07-11: qty 2

## 2018-07-11 MED ORDER — PANTOPRAZOLE SODIUM 40 MG PO TBEC
40.0000 mg | DELAYED_RELEASE_TABLET | Freq: Every day | ORAL | Status: DC
  Administered 2018-07-12 – 2018-07-15 (×4): 40 mg via ORAL
  Filled 2018-07-11 (×4): qty 1

## 2018-07-11 NOTE — ED Notes (Addendum)
Pts visitor  removed a N95 mask from Covid cart and left the department, aware she cannot return to visit.

## 2018-07-11 NOTE — ED Notes (Signed)
ED Provider at bedside. 

## 2018-07-11 NOTE — ED Provider Notes (Addendum)
[facility]-EMERGENCY DEPT Provider Note   CSN: [id number] Arrival date & time: 07/11/18  1429    History   Chief Complaint Chief Complaint  Patient presents with   Fall    HPI [name] is a 53 y.o. male.     HPI   53 year old male with a history of arthritis, asthma, chronic back pain (intrathecal fentanyl/bupivacaine pump), CHF, GERD, sleep apnea (noncompliant with CPAP), who presents to the emergency department today for evaluation of altered mental status, multiple falls and muscle jerking.  Patient's history somewhat limited due to his altered mental status but he is concerned because he is having muscle twitching for several days.  He relates this to his nausea medication Phenergan.  He also states he has been feeling short of breath for a few days.  He reports he has not been taking his medications for his CHF.  He denies any known fevers or cough.  Denies abdominal pain, nausea vomiting diarrhea or urinary symptoms.  Patient states that his last drink was 2 days ago.  Additional history obtained from the patient's wife who states patient has had altered mental status and has been very confused today.  She states that he has fallen 10 times today and is not himself.  She reports he has had muscle jerking she does not know why.  She states he had this during his last admission as well.  She was unaware that patient had been having fevers.  She states he has been drinking again recently.  Past Medical History:  Diagnosis Date   Arthritis    Asthma    as child   Back pain    CHF (congestive heart failure) (HCC)    GERD (gastroesophageal reflux disease)    Hernia    History of hiatal hernia    Hypogonadism in male    Neuromuscular disorder (HCC)    back injury with surgery   PONV (postoperative nausea and vomiting)    gets nausea   Shortness of breath dyspnea    Sleep apnea    Does not use CPAP, new equipment 12/2014   Ulcer     Wears glasses     Patient Active Problem List   Diagnosis Date Noted   Obesity, morbid (more than 100 lbs over ideal weight or BMI > 40) (HCC) 06/30/2018   DDD (degenerative disc disease), thoracic 06/30/2018   Osteoarthritis of lumbar spine 06/30/2018   History of acute alcoholic pancreatitis 06/30/2018   Hypogonadism in male 06/30/2018   Vitamin D deficiency 06/30/2018   Chronic hyperglycemia 06/30/2018   Prediabetes 06/30/2018   Alcoholism (HCC) 06/18/2018   Alcohol-induced acute pancreatitis without infection or necrosis 06/18/2018   Acute alcoholic pancreatitis 06/11/2018   Hypokalemia 06/11/2018   Hyponatremia 06/11/2018   Chronic diastolic CHF (congestive heart failure) (HCC) 06/11/2018   Acute pancreatitis 06/10/2018   Nausea and vomiting 06/10/2018   Abdominal pain 06/10/2018   Encounter for screening colonoscopy 06/10/2018   Acute CHF (congestive heart failure) (HCC) 05/29/2018   Withdrawal from opioids (HCC) 05/27/2018   Morbid obesity with BMI of 50.0-59.9, adult (HCC) 05/25/2018   Secondary osteoarthritis of multiple sites 05/25/2018   Osteoarthritis of hip (Right) 05/25/2018   Back pain    Encounter for adjustment or management of infusion pump 04/01/2018   Lumbar spondylosis 01/26/2018   Spondylosis without myelopathy or radiculopathy, lumbosacral region 12/02/2017   Lumbar facet syndrome (Bilateral) (R>L) 12/02/2017   Chronic low back pain (Primary Area of Pain) (  Bilateral) (R>L) 12/02/2017   Chronic knee pain after total replacement (Left) 12/01/2017   Chronic musculoskeletal pain 11/17/2017   Muscle spasticity 11/17/2017   Gastroesophageal reflux disease without esophagitis 11/17/2017   Neurogenic pain 11/17/2017   Drug-induced nausea and vomiting 11/17/2017   Therapeutic opioid-induced constipation (OIC) 11/17/2017   Chronic pain syndrome 11/09/2017   Long term prescription benzodiazepine use 11/09/2017   Long term  prescription opiate use 11/09/2017   Oral Opiate use (60 MME/day) 11/09/2017   Long-term use of high-risk medication 11/09/2017   Pharmacologic therapy 11/09/2017   Disorder of skeletal system 11/09/2017   Problems influencing health status 11/09/2017   Chronic nausea s/p bariatric surgery 11/09/2017   Presence of intrathecal pump 11/09/2017   Encounter for interrogation of infusion pump 11/09/2017   Chronic foot pain (Right) 11/09/2017   Chronic hip pain (Right) 11/09/2017   Old tear of medial meniscus of knee (Left) 11/09/2017   Failed back surgical syndrome 11/09/2017   Chronic low back pain (Bilateral) w/ sciatica (Bilateral) 11/09/2017   Chronic lower extremity pain (Bilateral) (L>R) 11/09/2017   Chronic lower extremity radicular pain 11/09/2017   Chronic shoulder pain (Bilateral) 11/09/2017   History of total knee replacement (Left) 11/09/2017   History of arthroscopic surgery of shoulder (Bilateral) 11/09/2017   S/P laparoscopic sleeve gastrectomy Sept 2016 01/08/2015   Severe obesity (BMI >= 40) (HCC) 10/21/2014   OSA complicated by mild polycythemia  10/21/2014   Polycythemia, secondary 10/21/2014   Dyspnea 10/20/2014   S/P rotator cuff repair (Bilateral) 08/18/2013   DDD (degenerative disc disease), lumbosacral 10/20/2006    Past Surgical History:  Procedure Laterality Date   BACK SURGERY     JOINT REPLACEMENT     Left knee replacement   LAPAROSCOPIC GASTRIC SLEEVE RESECTION N/A 01/08/2015   Procedure: LAPAROSCOPIC GASTRIC SLEEVE RESECTION;  Surgeon: Luretha Murphy, MD;  Location: [facility];  Service: General;  Laterality: N/A;   morphine pump     SHOULDER ARTHROSCOPY WITH SUBACROMIAL DECOMPRESSION AND BICEP TENDON REPAIR Left 10/06/2013   Procedure: LEFT SHOULDER ARTHROSCOPY DEBRIDEMENT EXTENTSIVE,DISTAL CLAVICULECTOMY,DECOMPRESSION SUBACROMIAL PARTIAL ACROMIOPLASTY WITH ROTATOR CUFF REPAIR, and excision of CALCIUM DEPOSIT;  Surgeon: Loreta Ave, MD;  Location: [facility];  Service: Orthopedics;  Laterality: Left;   SHOULDER ARTHROSCOPY WITH SUBACROMIAL DECOMPRESSION, ROTATOR CUFF REPAIR AND BICEP TENDON REPAIR Right 08/18/2013   Procedure: RIGHT SHOULDER ARTHROSCOPY WITH SUBACROMIAL DECOMPRESSION/PARTIAL ACROMIOPLASTY WITH CORACOACROMIAL RELEASE/DISTAL CLAVICULECTOMY/ ROTATOR CUFF REPAIR/DEBRIDEMENT EXTENTSIVE;  Surgeon: Loreta Ave, MD;  Location: [facility];  Service: Orthopedics;  Laterality: Right;  ANESTHESIA: GENERAL, PRE/POST OP SCALENE   TONSILLECTOMY          Home Medications    Prior to Admission medications   Medication Sig Start Date End Date Taking? Authorizing Provider  aspirin EC 81 MG EC tablet Take 1 tablet (81 mg total) by mouth daily. 06/04/18   Purohit, Salli Quarry, MD  baclofen (LIORESAL) 10 MG tablet Take 1 tablet (10 mg total) by mouth 4 (four) times daily. 06/30/18 08/29/18  [name], MD  bumetanide (BUMEX) 1 MG tablet Take 1 tablet (1 mg total) by mouth 2 (two) times daily for 30 days. 07/05/18 08/04/18  Parke Poisson, MD  escitalopram (LEXAPRO) 10 MG tablet Take 10 mg by mouth daily.    [provider]  esomeprazole (NEXIUM) 40 MG capsule Take 1 capsule (40 mg total) by mouth 2 (two) times daily. 06/30/18 08/29/18  [name], MD  folic acid (FOLVITE) 1 MG  tablet Take 1 tablet (1 mg total) by mouth daily. Patient not taking: Reported on 06/30/2018 06/15/18   Burnadette PopAdhikari, Amrit, MD  Gabapentin Enacarbil (HORIZANT) 600 MG TBCR Take 1 tablet (600 mg total) by mouth 2 (two) times daily. 06/30/18 08/29/18  Delano MetzNaveira, Francisco, MD  GNP MILK OF MAGNESIA 1200 MG/15ML suspension Take 5 mLs by mouth daily as needed for mild constipation.  05/05/18   [provider]  hydrOXYzine (ATARAX/VISTARIL) 25 MG tablet Take 25 mg by mouth every 8 (eight) hours as needed for itching.  06/03/18   [provider]  Magnesium Oxide 500 MG CAPS Take 1 capsule (500 mg  total) by mouth 2 (two) times daily at 8 am and 10 pm. 06/30/18 08/29/18  Delano MetzNaveira, Francisco, MD  NON FORMULARY 959 mcg by Intrathecal route daily. IT pump Fentanyl 2,000.0 mcg/ml Bupivicaine 20.0 mg/ml 40 ml pump    [provider]  ondansetron (ZOFRAN-ODT) 8 MG disintegrating tablet Take 1 tablet (8 mg total) by mouth every 8 (eight) hours as needed for nausea or vomiting. 06/30/18 08/29/18  Delano MetzNaveira, Francisco, MD  Oxycodone HCl 10 MG TABS Take 1 tablet (10 mg total) by mouth every 6 (six) hours as needed for up to 30 days. Must last 30 days. Max: 4/day. 06/30/18 07/30/18  Delano MetzNaveira, Francisco, MD  Oxycodone HCl 10 MG TABS Take 1 tablet (10 mg total) by mouth every 6 (six) hours as needed for up to 30 days. Must last 30 days. Max: 4/day. 07/30/18 08/29/18  Delano MetzNaveira, Francisco, MD  polycarbophil (FIBERCON) 625 MG tablet Take 625 mg by mouth 3 (three) times daily.     [provider]  potassium chloride 20 MEQ TBCR Take 40 mEq by mouth daily for 7 days. 06/15/18 06/22/18  Burnadette PopAdhikari, Amrit, MD  promethazine (PHENERGAN) 25 MG suppository Place 25 mg rectally every 6 (six) hours as needed for nausea/vomiting. 06/09/18   [provider]  promethazine (PHENERGAN) 25 MG tablet Take 25 mg by mouth every 8 (eight) hours. 05/12/18   [provider]  thiamine 100 MG tablet Take 1 tablet (100 mg total) by mouth daily. Patient not taking: Reported on 06/30/2018 06/15/18   Burnadette PopAdhikari, Amrit, MD    Family History Family History  Problem Relation Age of Onset   Diabetes Mother    Pneumonia Father     Social History Social History   Tobacco Use   Smoking status: Never Smoker   Smokeless tobacco: Current User    Types: Snuff  Substance Use Topics   Alcohol use: Yes    Comment: 2-7 beers per day, starting in Oct 2019.   Drug use: No     Allergies   Tape   Review of Systems Review of Systems  Unable to perform ROS: Mental status change     Physical Exam Updated Vital  Signs BP (!) 110/93    Pulse 84    Temp (!) 100.9 F (38.3 C) (Rectal)    Resp (!) 21    SpO2 93%   Physical Exam Vitals signs and nursing note reviewed.  Constitutional:      Appearance: He is well-developed. He is obese. He is diaphoretic.     Comments: Chronically ill appearing  HENT:     Head: Normocephalic and atraumatic.     Mouth/Throat:     Mouth: Mucous membranes are dry.  Eyes:     Extraocular Movements: Extraocular movements intact.     Conjunctiva/sclera: Conjunctivae normal.     Pupils: Pupils are equal,  round, and reactive to light.  Neck:     Musculoskeletal: Neck supple.  Cardiovascular:     Rate and Rhythm: Normal rate and regular rhythm.     Heart sounds: Normal heart sounds. No murmur.  Pulmonary:     Effort: No respiratory distress.     Breath sounds: Normal breath sounds. No stridor. No wheezing, rhonchi or rales.     Comments: Tachypneic when laying flat Abdominal:     General: Bowel sounds are normal.     Palpations: Abdomen is soft.     Tenderness: There is no abdominal tenderness. There is no guarding or rebound.  Musculoskeletal:     Right lower leg: No edema.     Left lower leg: No edema.     Comments: No midline ttp to the cervical or thoracic spine. Large midline surgical scar to the lumbar spine with ttp (chronic per pt).  Skin:    General: Skin is warm.     Comments: Multiple abrasions to the back  Neurological:     Mental Status: He is alert.     Comments: Somnolent but arousable to voice. Makes eye contact. Oriented to self, date, and situation. No oriented to place. Intermittently answering questions inappropriately. Moves all extremities. Normal strength to bue and ble. Normal sensation. Negative pronator drift. No facial droop.       ED Treatments / Results  Labs (all labs ordered are listed, but only abnormal results are displayed) Labs Reviewed  LACTIC ACID, PLASMA - Abnormal; Notable for the following components:      Result  Value   Lactic Acid, Venous 2.2 (*)    All other components within normal limits  COMPREHENSIVE METABOLIC PANEL - Abnormal; Notable for the following components:   Glucose, Bld 140 (*)    Calcium 8.6 (*)    All other components within normal limits  CBC WITH DIFFERENTIAL/PLATELET - Abnormal; Notable for the following components:   MCHC 29.4 (*)    RDW 16.1 (*)    nRBC 0.9 (*)    All other components within normal limits  AMMONIA - Abnormal; Notable for the following components:   Ammonia 50 (*)    All other components within normal limits  PHOSPHORUS - Abnormal; Notable for the following components:   Phosphorus 2.3 (*)    All other components within normal limits  BLOOD GAS, VENOUS - Abnormal; Notable for the following components:   pCO2, Ven 64.6 (*)    Bicarbonate 35.6 (*)    Acid-Base Excess 8.1 (*)    All other components within normal limits  LACTATE DEHYDROGENASE - Abnormal; Notable for the following components:   LDH 234 (*)    All other components within normal limits  C-REACTIVE PROTEIN - Abnormal; Notable for the following components:   CRP 1.8 (*)    All other components within normal limits  D-DIMER, QUANTITATIVE (NOT AT [facility]) - Abnormal; Notable for the following components:   D-Dimer, Quant 1.06 (*)    All other components within normal limits  CULTURE, BLOOD (ROUTINE X 2)  CULTURE, BLOOD (ROUTINE X 2)  NOVEL CORONAVIRUS, NAA (HOSPITAL ORDER, SEND-OUT TO REF LAB)  URINALYSIS, ROUTINE W REFLEX MICROSCOPIC  ETHANOL  MAGNESIUM  LIPASE, BLOOD  TSH  TROPONIN I  FERRITIN  RAPID URINE DRUG SCREEN, HOSP PERFORMED  BRAIN NATRIURETIC PEPTIDE  PROTIME-INR  CBG MONITORING, ED    EKG EKG Interpretation  Date/Time:  Sunday July 11 2018 15:00:14 EDT Ventricular Rate:  90 PR Interval:  QRS Duration: 103 QT Interval:  369 QTC Calculation: 452 R Axis:   79 Text Interpretation:  Sinus rhythm Low voltage, extremity leads baseline artifact. no ischemic changes.  normalized T wave compared to prior Confirmed by Arby Barrette [phone]) on 07/11/2018 3:03:53 PM   Radiology Ct Head Wo Contrast  Result Date: 07/11/2018 CLINICAL DATA:  Muscle spasms all over for the last 2 days. 10 falls within the last 2 days. EXAM: CT HEAD WITHOUT CONTRAST CT CERVICAL SPINE WITHOUT CONTRAST TECHNIQUE: Multidetector CT imaging of the head and cervical spine was performed following the standard protocol without intravenous contrast. Multiplanar CT image reconstructions of the cervical spine were also generated. COMPARISON:  None. FINDINGS: CT HEAD FINDINGS Brain: No subdural, epidural, or subarachnoid hemorrhage. Cerebellum, brainstem, and basal cisterns are normal. Ventricles and sulci are unremarkable. No mass effect or midline shift. No acute cortical ischemia or infarct. Vascular: No hyperdense vessel or unexpected calcification. Skull: Normal. Negative for fracture or focal lesion. Sinuses/Orbits: No acute finding. Other: None. CT CERVICAL SPINE FINDINGS Alignment: No malalignment. Skull base and vertebrae: No fractures. Soft tissues and spinal canal: No soft tissue abnormalities. Disc levels:  No significant degenerative changes. Upper chest: Only the superior most aspect of the lung apices was imaged with no abnormalities. Other: No other abnormalities. IMPRESSION: 1. No acute intracranial abnormalities. 2. No fracture or traumatic malalignment in the cervical spine. Electronically Signed   By: Gerome Sam III M.D   On: 07/11/2018 16:32   Ct Cervical Spine Wo Contrast  Result Date: 07/11/2018 CLINICAL DATA:  Muscle spasms all over for the last 2 days. 10 falls within the last 2 days. EXAM: CT HEAD WITHOUT CONTRAST CT CERVICAL SPINE WITHOUT CONTRAST TECHNIQUE: Multidetector CT imaging of the head and cervical spine was performed following the standard protocol without intravenous contrast. Multiplanar CT image reconstructions of the cervical spine were also generated.  COMPARISON:  None. FINDINGS: CT HEAD FINDINGS Brain: No subdural, epidural, or subarachnoid hemorrhage. Cerebellum, brainstem, and basal cisterns are normal. Ventricles and sulci are unremarkable. No mass effect or midline shift. No acute cortical ischemia or infarct. Vascular: No hyperdense vessel or unexpected calcification. Skull: Normal. Negative for fracture or focal lesion. Sinuses/Orbits: No acute finding. Other: None. CT CERVICAL SPINE FINDINGS Alignment: No malalignment. Skull base and vertebrae: No fractures. Soft tissues and spinal canal: No soft tissue abnormalities. Disc levels:  No significant degenerative changes. Upper chest: Only the superior most aspect of the lung apices was imaged with no abnormalities. Other: No other abnormalities. IMPRESSION: 1. No acute intracranial abnormalities. 2. No fracture or traumatic malalignment in the cervical spine. Electronically Signed   By: Gerome Sam III M.D   On: 07/11/2018 16:32   Dg Chest Portable 1 View  Result Date: 07/11/2018 CLINICAL DATA:  Abnormal twitching and extremities. EXAM: PORTABLE CHEST 1 VIEW COMPARISON:  June 10, 2018 FINDINGS: Cardiomegaly. The hila and mediastinum are unremarkable. Pulmonary venous congestion without overt edema. No suspicious infiltrates. IMPRESSION: Cardiomegaly and mild pulmonary venous congestion without overt edema. Electronically Signed   By: Gerome Sam III M.D   On: 07/11/2018 15:43    Procedures Procedures (including critical care time) CRITICAL CARE Performed by: Karrie Meres   Total critical care time: 40 minutes  Critical care time was exclusive of separately billable procedures and treating other patients.  Critical care was necessary to treat or prevent imminent or life-threatening deterioration.  Critical care was time spent personally by me on the following activities: development of  treatment plan with patient and/or surrogate as well as nursing, discussions with  consultants, evaluation of patient's response to treatment, examination of patient, obtaining history from patient or surrogate, ordering and performing treatments and interventions, ordering and review of laboratory studies, ordering and review of radiographic studies, pulse oximetry and re-evaluation of patient's condition.   Medications Ordered in ED Medications  acetaminophen (TYLENOL) tablet 650 mg (650 mg Oral Given 07/11/18 1619)  furosemide (LASIX) injection 40 mg (40 mg Intravenous Given 07/11/18 1619)     Initial Impression / Assessment and Plan / ED Course  I have reviewed the triage vital signs and the nursing notes.  Pertinent labs & imaging results that were available during my care of the patient were reviewed by me and considered in my medical decision making (see chart for details).     Final Clinical Impressions(s) / ED Diagnoses   Final diagnoses:  Altered mental status, unspecified altered mental status type  Acute respiratory failure with hypoxia and hypercarbia (HCC)  Myoclonic jerking   Patient is a 53 year old male presenting for evaluation of myoclonic jerking, hypoxia, altered mental status and multiple falls.   Cbc notable for no leukocytosis. No anemia. cmp reassuring. ammonial slightly elevated. No know hx of liver dz. etoh negative Mg wnl Phosphorous low Lactic acid slightly elevated TSH wnl Trop negative  CRP, ddimer, and LDH elevated vbg with elevated pCO2, elevated bicarb  UA pending Novel coronavirus lab testing obtained.   CXR w cardiomegaly and pulmonary venous congestion. Lasix given for diuresis. CT head without intracranial hemorrhage or other acute abnormality Ct cerivcal spine ngative   At this point differential is wide and etiology of his symptoms is currently unclear however he could be multifactorial.  There is concern for polypharmacy.  Patient taking multiple medications that could induce myoclonic jerking symptoms.  Also  recently added SSRI to medication regimen earlier this month.  His bupivacaine/fentanyl pump was also recently increased by his pain doctor 1 week ago.  Patient also hypoxic and hypercarbic which could be contributing.  He also has a fever, therefore infectious etiology also being considered including COVID-19. Pt will require admission for further tx.  5:25 PM Consult with Dr. Lowell Guitar who accepts pt for admission.   [name] was evaluated in Emergency Department on 07/11/2018 for the symptoms described in the history of present illness. He was evaluated in the context of the global COVID-19 pandemic, which necessitated consideration that the patient might be at risk for infection with the SARS-CoV-2 virus that causes COVID-19. Institutional protocols and algorithms that pertain to the evaluation of patients at risk for COVID-19 are in a state of rapid change based on information released by regulatory bodies including the CDC and federal and state organizations. These policies and algorithms were followed during the patient's care in the ED.   ED Discharge Orders    None       Karrie Meres, PA-C 07/11/18 [address], PA-C 07/11/18 1732    Arby Barrette, MD 07/16/18 1610    Arby Barrette, MD 07/16/18 [phone]

## 2018-07-11 NOTE — ED Notes (Signed)
ED TO INPATIENT HANDOFF REPORT  ED Nurse Name and Phone #:  Leatrice Jewels, RN  S Name/Age/Gender [name] 53 y.o. male Room/Bed: WA24/WA24  Code Status   Code Status: Prior  Home/SNF/Other Home Patient oriented to: self Is this baseline? No   Triage Complete: Triage complete  Chief Complaint weakness; ShOB  Triage Note Transported by [facility] from home-- patient reports that he has been having muscles spasms "all over" for the last 2 days (favoring his low back/ and both knees). Has experienced 10 falls within the last 2 days. Diagnosed with CHF 1 month ago also has Fentanyl pump located in left lower abdomen.    Allergies Allergies  Allergen Reactions  . Tape Other (See Comments)    Blisters. Pt tolerates paper tape.      Level of Care/Admitting Diagnosis ED Disposition    ED Disposition Condition Comment   Admit  Hospital Area: [facility] [[id number]]  Level of Care: Telemetry [5]  Admit to tele based on following criteria: Other see comments  Comments: ams  Diagnosis: AMS (altered mental status) [[id number]]  Admitting Physician: Zigmund Daniel [phone]  Attending Physician: Shaune Spittle, A CALDWELL [phone]  Estimated length of stay: past midnight tomorrow  Certification:: I certify this patient will need inpatient services for at least 2 midnights  Bed request comments: low risk covid  PT Class (Do Not Modify): Inpatient [101]  PT Acc Code (Do Not Modify): Private [1]       B Medical/Surgery History Past Medical History:  Diagnosis Date  . Arthritis   . Asthma    as child  . Back pain   . CHF (congestive heart failure) (HCC)   . GERD (gastroesophageal reflux disease)   . Hernia   . History of hiatal hernia   . Hypogonadism in male   . Neuromuscular disorder (HCC)    back injury with surgery  . PONV (postoperative nausea and vomiting)    gets nausea  . Shortness of breath dyspnea   . Sleep apnea    Does not use CPAP, new  equipment 12/2014  . Ulcer   . Wears glasses    Past Surgical History:  Procedure Laterality Date  . BACK SURGERY    . JOINT REPLACEMENT     Left knee replacement  . LAPAROSCOPIC GASTRIC SLEEVE RESECTION N/A 01/08/2015   Procedure: LAPAROSCOPIC GASTRIC SLEEVE RESECTION;  Surgeon: Luretha Murphy, MD;  Location: [facility];  Service: General;  Laterality: N/A;  . morphine pump    . SHOULDER ARTHROSCOPY WITH SUBACROMIAL DECOMPRESSION AND BICEP TENDON REPAIR Left 10/06/2013   Procedure: LEFT SHOULDER ARTHROSCOPY DEBRIDEMENT EXTENTSIVE,DISTAL CLAVICULECTOMY,DECOMPRESSION SUBACROMIAL PARTIAL ACROMIOPLASTY WITH ROTATOR CUFF REPAIR, and excision of CALCIUM DEPOSIT;  Surgeon: Loreta Ave, MD;  Location: [facility];  Service: Orthopedics;  Laterality: Left;  . SHOULDER ARTHROSCOPY WITH SUBACROMIAL DECOMPRESSION, ROTATOR CUFF REPAIR AND BICEP TENDON REPAIR Right 08/18/2013   Procedure: RIGHT SHOULDER ARTHROSCOPY WITH SUBACROMIAL DECOMPRESSION/PARTIAL ACROMIOPLASTY WITH CORACOACROMIAL RELEASE/DISTAL CLAVICULECTOMY/ ROTATOR CUFF REPAIR/DEBRIDEMENT EXTENTSIVE;  Surgeon: Loreta Ave, MD;  Location: [facility];  Service: Orthopedics;  Laterality: Right;  ANESTHESIA: GENERAL, PRE/POST OP SCALENE  . TONSILLECTOMY       A IV Location/Drains/Wounds Patient Lines/Drains/Airways Status   Active Line/Drains/Airways    Name:   Placement date:   Placement time:   Site:   Days:   Peripheral IV 07/11/18 Right Hand   07/11/18    1528    Hand  less than 1          Intake/Output Last 24 hours No intake or output data in the 24 hours ending 07/11/18 2002  [name]/Imaging Results for orders placed or performed during the hospital encounter of 07/11/18 (from the past 48 hour(s))  Lactic acid, plasma     Status: Abnormal   Collection Time: 07/11/18  3:11 PM  Result Value Ref Range   Lactic Acid, Venous 2.2 (HH) 0.5 - 1.9 mmol/L    Comment: CRITICAL RESULT CALLED TO, READ BACK BY AND  VERIFIED WITH: JESSEE,B RN [phone] C[place],N Performed at [facility], [address] [place], [place] [address]   Comprehensive metabolic panel     Status: Abnormal   Collection Time: 07/11/18  3:11 PM  Result Value Ref Range   Sodium 140 135 - 145 mmol/L   Potassium 4.2 3.5 - 5.1 mmol/L   Chloride 101 98 - 111 mmol/L   CO2 32 22 - 32 mmol/L   Glucose, Bld 140 (H) 70 - 99 mg/dL   BUN 6 6 - 20 mg/dL   Creatinine, Ser 1.66 0.61 - 1.24 mg/dL   Calcium 8.6 (L) 8.9 - 10.3 mg/dL   Total Protein 7.2 6.5 - 8.1 g/dL   Albumin 3.6 3.5 - 5.0 g/dL   AST 22 15 - 41 U/L   ALT 24 0 - 44 U/L   Alkaline Phosphatase 86 38 - 126 U/L   Total Bilirubin 0.7 0.3 - 1.2 mg/dL   GFR calc non Af Amer >60 >60 mL/min   GFR calc Af Amer >60 >60 mL/min   Anion gap 7 5 - 15    Comment: Performed at [facility], [address], [place], [place] [address]  CBC WITH DIFFERENTIAL     Status: Abnormal   Collection Time: 07/11/18  3:11 PM  Result Value Ref Range   WBC 8.6 4.0 - 10.5 K/uL   RBC 4.86 4.22 - 5.81 MIL/uL   Hemoglobin 13.6 13.0 - 17.0 g/dL   HCT 60.1 09.3 - 23.5 %   MCV 95.1 80.0 - 100.0 fL   MCH 28.0 26.0 - 34.0 pg   MCHC 29.4 (L) 30.0 - 36.0 g/dL   RDW 57.3 (H) 22.0 - 25.4 %   Platelets 201 150 - 400 K/uL   nRBC 0.9 (H) 0.0 - 0.2 %   Neutrophils Relative % 72 %   Neutro Abs 6.3 1.7 - 7.7 K/uL   Lymphocytes Relative 18 %   Lymphs Abs 1.6 0.7 - 4.0 K/uL   Monocytes Relative 6 %   Monocytes Absolute 0.5 0.1 - 1.0 K/uL   Eosinophils Relative 2 %   Eosinophils Absolute 0.1 0.0 - 0.5 K/uL   Basophils Relative 1 %   Basophils Absolute 0.0 0.0 - 0.1 K/uL   Immature Granulocytes 1 %   Abs Immature Granulocytes 0.05 0.00 - 0.07 K/uL    Comment: Performed at [facility], [address], [place], [place] [address]  Urinalysis, Routine w reflex microscopic     Status: None   Collection Time: 07/11/18  3:11 PM  Result Value Ref Range    Color, Urine YELLOW YELLOW   APPearance CLEAR CLEAR   Specific Gravity, Urine 1.009 1.005 - 1.030   pH 6.0 5.0 - 8.0   Glucose, UA NEGATIVE NEGATIVE mg/dL   Hgb urine dipstick NEGATIVE NEGATIVE   Bilirubin Urine NEGATIVE NEGATIVE   Ketones, ur NEGATIVE NEGATIVE mg/dL   Protein, ur  NEGATIVE NEGATIVE mg/dL   Nitrite NEGATIVE NEGATIVE   Leukocytes,Ua NEGATIVE NEGATIVE    Comment: Performed at [facility], [address] [place], [place] [address]  Ethanol     Status: None   Collection Time: 07/11/18  3:11 PM  Result Value Ref Range   Alcohol, Ethyl (B) <10 <10 mg/dL    Comment: (NOTE) Lowest detectable limit for serum alcohol is 10 mg/dL. For medical purposes only. Performed at [facility], [address], [place], [place] [address]   Urine rapid drug screen (hosp performed)     Status: Abnormal   Collection Time: 07/11/18  3:11 PM  Result Value Ref Range   Opiates POSITIVE (A) NONE DETECTED   Cocaine NONE DETECTED NONE DETECTED   Benzodiazepines NONE DETECTED NONE DETECTED   Amphetamines NONE DETECTED NONE DETECTED   Tetrahydrocannabinol NONE DETECTED NONE DETECTED   Barbiturates NONE DETECTED NONE DETECTED    Comment: (NOTE) DRUG SCREEN FOR MEDICAL PURPOSES ONLY.  IF CONFIRMATION IS NEEDED FOR ANY PURPOSE, NOTIFY LAB WITHIN 5 DAYS. LOWEST DETECTABLE LIMITS FOR URINE DRUG SCREEN Drug Class                     Cutoff (ng/mL) Amphetamine and metabolites    1000 Barbiturate and metabolites    200 Benzodiazepine                 200 Tricyclics and metabolites     300 Opiates and metabolites        300 Cocaine and metabolites        300 THC                            50 Performed at [facility], [address] [place], [place] [address]   Magnesium     Status: None   Collection Time: 07/11/18  3:24 PM  Result Value Ref Range   Magnesium 2.1 1.7 - 2.4 mg/dL    Comment: Performed at [facility], [address], [place], [place] [address]  Lipase, blood     Status: None   Collection Time: 07/11/18  3:24 PM  Result Value Ref Range   Lipase 21 11 - 51 U/L    Comment: Performed at [facility], [address] [place], [place] [address]  Phosphorus     Status: Abnormal   Collection Time: 07/11/18  3:24 PM  Result Value Ref Range   Phosphorus 2.3 (L) 2.5 - 4.6 mg/dL    Comment: Performed at [facility], [address], [place], [place] [address]  TSH     Status: None   Collection Time: 07/11/18  3:24 PM  Result Value Ref Range   TSH 0.938 0.350 - 4.500 uIU/mL    Comment: Performed by a 3rd Generation assay with a functional sensitivity of <=0.01 uIU/mL. Performed at [facility], [address], [place], [place] [address]   Troponin I - Once     Status: None   Collection Time: 07/11/18  3:24 PM  Result Value Ref Range   Troponin I <0.03 <0.03 ng/mL    Comment: Performed at [facility], [address], [place], [place] [address]  Lactate dehydrogenase     Status: Abnormal   Collection Time: 07/11/18  3:24 PM  Result Value Ref Range   LDH 234 (H) 98 - 192 U/L  Comment: Performed at [facility], [address], [place], [place] [address]  Ferritin (Iron Binding Protein)     Status: None   Collection Time: 07/11/18  3:24 PM  Result Value Ref Range   Ferritin 156 24 - 336 ng/mL    Comment: Performed at [facility], [address] [place], [place] [address]  C-reactive protein     Status: Abnormal   Collection Time: 07/11/18  3:24 PM  Result Value Ref Range   CRP 1.8 (H) <1.0 mg/dL    Comment: Performed at [facility], [address], [place], [place] [address]  D-dimer, quantitative (not at [facility])     Status: Abnormal   Collection Time: 07/11/18  3:24 PM  Result Value Ref Range   D-Dimer, Quant 1.06 (H) 0.00 - 0.50 ug/mL-FEU     Comment: (NOTE) At the manufacturer cut-off of 0.50 ug/mL FEU, this assay has been documented to exclude PE with a sensitivity and negative predictive value of 97 to 99%.  At this time, this assay has not been approved by the FDA to exclude DVT/VTE. Results should be correlated with clinical presentation. Performed at [facility], [address] [place], [place] [address]   Protime-INR     Status: None   Collection Time: 07/11/18  3:24 PM  Result Value Ref Range   Prothrombin Time 12.1 11.4 - 15.2 seconds   INR 0.9 0.8 - 1.2    Comment: (NOTE) INR goal varies based on device and disease states. Performed at [facility], [address] [place], [place] [address]   Ammonia     Status: Abnormal   Collection Time: 07/11/18  3:29 PM  Result Value Ref Range   Ammonia 50 (H) 9 - 35 umol/L    Comment: Performed at [facility], [address] [place], [place] [address]  Blood gas, venous     Status: Abnormal   Collection Time: 07/11/18  3:40 PM  Result Value Ref Range   FIO2 21.00    Delivery systems ROOM AIR    pH, Ven 7.360 7.250 - 7.430   pCO2, Ven 64.6 (H) 44.0 - 60.0 mmHg   pO2, Ven 35.2 32.0 - 45.0 mmHg   Bicarbonate 35.6 (H) 20.0 - 28.0 mmol/L   Acid-Base Excess 8.1 (H) 0.0 - 2.0 mmol/L   O2 Saturation 63.4 %   Patient temperature 98.6    Collection site VEIN    Drawn by RN    Sample type VEIN     Comment: Performed at [facility], [address], [place], [place] [address]  Brain natriuretic peptide     Status: Abnormal   Collection Time: 07/11/18  4:48 PM  Result Value Ref Range   B Natriuretic Peptide 348.1 (H) 0.0 - 100.0 pg/mL    Comment: Performed at [facility], [address], [place], [place] [address]   Ct Head Wo Contrast  Result Date: 07/11/2018 CLINICAL DATA:  Muscle spasms all over for the last 2 days. 10 falls within the last 2 days. EXAM: CT HEAD WITHOUT  CONTRAST CT CERVICAL SPINE WITHOUT CONTRAST TECHNIQUE: Multidetector CT imaging of the head and cervical spine was performed following the standard protocol without intravenous contrast. Multiplanar CT image reconstructions of the cervical spine were also generated. COMPARISON:  None. FINDINGS: CT HEAD FINDINGS Brain: No subdural, epidural, or subarachnoid hemorrhage. Cerebellum, brainstem, and basal cisterns are normal. Ventricles and sulci are  unremarkable. No mass effect or midline shift. No acute cortical ischemia or infarct. Vascular: No hyperdense vessel or unexpected calcification. Skull: Normal. Negative for fracture or focal lesion. Sinuses/Orbits: No acute finding. Other: None. CT CERVICAL SPINE FINDINGS Alignment: No malalignment. Skull base and vertebrae: No fractures. Soft tissues and spinal canal: No soft tissue abnormalities. Disc levels:  No significant degenerative changes. Upper chest: Only the superior most aspect of the lung apices was imaged with no abnormalities. Other: No other abnormalities. IMPRESSION: 1. No acute intracranial abnormalities. 2. No fracture or traumatic malalignment in the cervical spine. Electronically Signed   By: Gerome Sam III M.D   On: 07/11/2018 16:32   Ct Cervical Spine Wo Contrast  Result Date: 07/11/2018 CLINICAL DATA:  Muscle spasms all over for the last 2 days. 10 falls within the last 2 days. EXAM: CT HEAD WITHOUT CONTRAST CT CERVICAL SPINE WITHOUT CONTRAST TECHNIQUE: Multidetector CT imaging of the head and cervical spine was performed following the standard protocol without intravenous contrast. Multiplanar CT image reconstructions of the cervical spine were also generated. COMPARISON:  None. FINDINGS: CT HEAD FINDINGS Brain: No subdural, epidural, or subarachnoid hemorrhage. Cerebellum, brainstem, and basal cisterns are normal. Ventricles and sulci are unremarkable. No mass effect or midline shift. No acute cortical ischemia or infarct. Vascular: No  hyperdense vessel or unexpected calcification. Skull: Normal. Negative for fracture or focal lesion. Sinuses/Orbits: No acute finding. Other: None. CT CERVICAL SPINE FINDINGS Alignment: No malalignment. Skull base and vertebrae: No fractures. Soft tissues and spinal canal: No soft tissue abnormalities. Disc levels:  No significant degenerative changes. Upper chest: Only the superior most aspect of the lung apices was imaged with no abnormalities. Other: No other abnormalities. IMPRESSION: 1. No acute intracranial abnormalities. 2. No fracture or traumatic malalignment in the cervical spine. Electronically Signed   By: Gerome Sam III M.D   On: 07/11/2018 16:32   Dg Chest Portable 1 View  Result Date: 07/11/2018 CLINICAL DATA:  Abnormal twitching and extremities. EXAM: PORTABLE CHEST 1 VIEW COMPARISON:  June 10, 2018 FINDINGS: Cardiomegaly. The hila and mediastinum are unremarkable. Pulmonary venous congestion without overt edema. No suspicious infiltrates. IMPRESSION: Cardiomegaly and mild pulmonary venous congestion without overt edema. Electronically Signed   By: Gerome Sam III M.D   On: 07/11/2018 15:43    Pending [name] Unresulted [name] (From admission, onward)    Start     Ordered   07/11/18 1847  Lactic acid, plasma  ONCE - STAT,   R     07/11/18 1846   07/11/18 1637  Novel Coronavirus, NAA (hospital order; send-out to ref lab)  (Novel Coronavirus, NAA [facility] Order; send-out to ref lab) with precautions panel)  Once,   R    Question Answer Comment  Current symptoms Fever and Shortness of breath   Excluded other viral illnesses No (testing not indicated)   Patient immune status Normal      07/11/18 1637   07/11/18 1547  Blood culture (routine x 2)  BLOOD CULTURE X 2,   STAT     07/11/18 1546   Signed and Held  CBC  (enoxaparin (LOVENOX)    CrCl >/= 30 ml/min)  Once,   R    Comments:  Baseline for enoxaparin therapy IF NOT ALREADY DRAWN.  Notify MD if PLT < 100 K.    Signed  and Held   Signed and Held  Creatinine, serum  (enoxaparin (LOVENOX)    CrCl >/= 30 ml/min)  Once,   R  Comments:  Baseline for enoxaparin therapy IF NOT ALREADY DRAWN.    Signed and Held   Signed and Held  Creatinine, serum  (enoxaparin (LOVENOX)    CrCl >/= 30 ml/min)  Weekly,   R    Comments:  while on enoxaparin therapy    Signed and Held          Vitals/Pain Today's Vitals   07/11/18 1803 07/11/18 1900 07/11/18 1915 07/11/18 1930  BP: 137/90 (!) 91/40 (!) 90/42 (!) 95/48  Pulse: 89 66 66 66  Resp: 20 20    Temp:      TempSrc:      SpO2: 94% 97% 96% 97%    Isolation Precautions Droplet and Contact precautions  Medications Medications  lactulose (CHRONULAC) 10 GM/15ML solution 20 g (has no administration in time range)  LORazepam (ATIVAN) tablet 1 mg (has no administration in time range)    Or  LORazepam (ATIVAN) injection 1 mg (has no administration in time range)  thiamine (VITAMIN B-1) tablet 100 mg (has no administration in time range)    Or  thiamine (B-1) injection 100 mg (has no administration in time range)  folic acid (FOLVITE) tablet 1 mg (has no administration in time range)  multivitamin with minerals tablet 1 tablet (has no administration in time range)  furosemide (LASIX) injection 40 mg (has no administration in time range)  acetaminophen (TYLENOL) tablet 650 mg (650 mg Oral Given 07/11/18 1619)  furosemide (LASIX) injection 40 mg (40 mg Intravenous Given 07/11/18 1619)    Mobility non-ambulatory High fall risk   Focused Assessments     R Recommendations: See Admitting Provider Note  Report given to:   Additional Notes:

## 2018-07-11 NOTE — ED Provider Notes (Signed)
Medical screening examination/treatment/procedure(s) were conducted as a shared visit with non-physician practitioner(s) and myself.  I personally evaluated the patient during the encounter.  EKG Interpretation  Date/Time:  Sunday July 11 2018 15:00:14 EDT Ventricular Rate:  90 PR Interval:    QRS Duration: 103 QT Interval:  369 QTC Calculation: 452 R Axis:   79 Text Interpretation:  Sinus rhythm Low voltage, extremity leads baseline artifact. no ischemic changes. normalized T wave compared to prior Confirmed by Arby Barrette [phone]) on 07/11/2018 3:03:53 PM  Patient reports that the most concerning symptoms he is experiencing is his jerking and twitching motion.  Has been going on for several days.  Ports he is not sure why it is happening but he does advise that he has had something like this once before.  He reports it was because of a nausea medication that he was taking.  Patient reports that he is taking Phenergan again as needed.  He also takes baclofen.  Has multiple medical comorbidities is an intrathecal fentanyl pump.  Patient is supposed to wear nighttime CPAP but reports he does not.  He has chronic back pain but does not advise of any new or changed pain.  Reportedly patient has fallen up to 10 times in the past 2 days.  Patient reports alcohol use but denies he is ever experienced any alcohol withdrawal.  Estimates his last alcohol was a several days ago.  Per PA-C who spoke with the patient's wife, patient also has been noncompliant with his CHF medications.  Patient is lying in the stretcher.  He is morbidly obese.  He is slightly somnolent.  He awakens to light voice and speaks interactively.  He does seem situationally oriented and is giving history.  Pupils are symmetric and responsive, mid range.  extraocular motions are intact.  Patient clearly has myoclonic jerking.  This is more exacerbated by movement and position changes.  He does assist in following commands.  Due to body  habitus this is challenging.  He will grip both handrails to try to pull himself forward in the stretcher for auscultation.  This activity significantly exacerbated his myoclonic jerking.  Heart is regular no gross rub murmur gallop.  Lungs are grossly clear but patient has very thick chest wall and difficult to appreciate lung sounds.  diffusely soft.  I do not appreciate any rhonchi or rale.  Abdomen is obese but not firm or tender.  Patient has bilateral peripheral edema approximately 2+.  Patient has multiple potential etiology for somnolence, myoclonic jerking and hypoxia.  Will need broad evaluation initiated.  Differential diagnosis includes medication reaction including serotonin syndrome, oversedation and hypoventilation, baclofen toxicity, electrlyted derangement, infectious etiology, CHF exacerbation, head injury trauma.  Patient will require admission.   Arby Barrette, MD 07/11/18 [phone]

## 2018-07-11 NOTE — ED Notes (Signed)
[name] [phone]

## 2018-07-11 NOTE — ED Notes (Signed)
Bed: HK74 Expected date:  Expected time:  Means of arrival:  Comments: 53 yo multiple falls, weakness, low O2 sats

## 2018-07-11 NOTE — ED Triage Notes (Signed)
Transported by [facility] from home-- patient reports that he has been having muscles spasms "all over" for the last 2 days (favoring his low back/ and both knees). Has experienced 10 falls within the last 2 days. Diagnosed with CHF 1 month ago also has Fentanyl pump located in left lower abdomen.

## 2018-07-11 NOTE — ED Notes (Signed)
Patient transported to CT 

## 2018-07-11 NOTE — ED Notes (Addendum)
Pts wife irate in the waiting room, yelling at staff, called Nurse 1st Arlys John a "son of a bitch" for not allowing her back to the room with her husband.  PA Toni Amend spoke to her about potential risks and allowed her to go back with patient.  This Clinical research associate spoke with visitor and again discussed potential risks of not wearing a mask, and being in contact with patient in the room,  advised she will not be able to leave the patients room except to go to the bathroom, and asked her to wear a mask.  Neither patient or visitor wearing masks inside room.  Visitor was made aware once she leaves, she will not be able to return but can certainly call and speak to patient, and that once admitted she will not be able to visit while restrictions are in place.  She voiced understanding.

## 2018-07-11 NOTE — ED Provider Notes (Signed)
MSE was initiated and I personally evaluated the patient and placed orders (if any) at  2:49 PM on July 11, 2018.  The patient appears stable so that the remainder of the MSE may be completed by another provider.  PT with "jerking" of extremities.  Is awake and alert, but confused at times.  Answers some questions repeatedly.  Is moving all extremities symmetrically   Rolan Bucco, MD 07/11/18 1450

## 2018-07-11 NOTE — ED Notes (Signed)
Date and time results received: 07/11/18 1609 (use smartphrase ".now" to insert current time)  Test: lactic acid Critical Value: 2.2  Name of Provider Notified: Cortni Couture  Orders Received? Or Actions Taken?: notified Cortni Couture of lactic acid 2.2.

## 2018-07-11 NOTE — H&P (Addendum)
History and Physical    SAMUL KREYLING NOM:[id number] DOB: 1965/05/25 DOA: 07/11/2018  PCP: [name], MD  Patient coming from: home  I have personally briefly reviewed patient's old medical records in [facility]  Chief Complaint: AMS  HPI: [name] is Timira Bieda 53 y.o. male with medical history significant of chronic back pain with intrathecal pump, heart failure with preserved ejection fraction, OSA, alcohol abuse, and multiple other medical problems presenting with recurrent falls and altered mental status.   Strays obtained with assistance from the patient's wife as the patient was altered.  She notes that her toes began within the past 24 hours.  Last night she started having jerking of his arms and legs leading him to fall.  She estimates that he fell 10-11 times.  The last time he was unable to get up which is when they called EMS to bring him to the hospital.  Today he seemed more confused.  She notes that this happens on occasion when he does not take his pain medicines, but that normally he does well.  She denies noting that he is recently had any fevers, cough, chest pain, shortness of breath.  She denies any sick contacts or recent travel.  She notes that they have been shelter in place recently.  She does note that he might have an occasional chill.  She states for the past 1 to 2 weeks he has been drinking 1-2 beers Leotha Voeltz night.  His last drink was last Friday.   night.  No drugs or smoking.   ED Course: Labs, imaging, lasix for concern for overload.  In setting of fever, COVID rule out.  Admit for AMS.   Review of Systems: As per HPI otherwise 10 point review of systems negative.   Past Medical History:  Diagnosis Date  . Arthritis   . Asthma    as child  . Back pain   . CHF (congestive heart failure) (HCC)   . GERD (gastroesophageal reflux disease)   . Hernia   . History of hiatal hernia   . Hypogonadism in male   . Neuromuscular disorder (HCC)    back injury with  surgery  . PONV (postoperative nausea and vomiting)    gets nausea  . Shortness of breath dyspnea   . Sleep apnea    Does not use CPAP, new equipment 12/2014  . Ulcer   . Wears glasses     Past Surgical History:  Procedure Laterality Date  . BACK SURGERY    . JOINT REPLACEMENT     Left knee replacement  . LAPAROSCOPIC GASTRIC SLEEVE RESECTION N/Ryeleigh Santore 01/08/2015   Procedure: LAPAROSCOPIC GASTRIC SLEEVE RESECTION;  Surgeon: Luretha Murphy, MD;  Location: [facility];  Service: General;  Laterality: N/Timoth Schara;  . morphine pump    . SHOULDER ARTHROSCOPY WITH SUBACROMIAL DECOMPRESSION AND BICEP TENDON REPAIR Left 10/06/2013   Procedure: LEFT SHOULDER ARTHROSCOPY DEBRIDEMENT EXTENTSIVE,DISTAL CLAVICULECTOMY,DECOMPRESSION SUBACROMIAL PARTIAL ACROMIOPLASTY WITH ROTATOR CUFF REPAIR, and excision of CALCIUM DEPOSIT;  Surgeon: Loreta Ave, MD;  Location: [facility];  Service: Orthopedics;  Laterality: Left;  . SHOULDER ARTHROSCOPY WITH SUBACROMIAL DECOMPRESSION, ROTATOR CUFF REPAIR AND BICEP TENDON REPAIR Right 08/18/2013   Procedure: RIGHT SHOULDER ARTHROSCOPY WITH SUBACROMIAL DECOMPRESSION/PARTIAL ACROMIOPLASTY WITH CORACOACROMIAL RELEASE/DISTAL CLAVICULECTOMY/ ROTATOR CUFF REPAIR/DEBRIDEMENT EXTENTSIVE;  Surgeon: Loreta Ave, MD;  Location: [facility];  Service: Orthopedics;  Laterality: Right;  ANESTHESIA: GENERAL, PRE/POST OP SCALENE  . TONSILLECTOMY       reports  that he has never smoked. His smokeless tobacco use includes snuff. He reports current alcohol use. He reports that he does not use drugs.  Allergies  Allergen Reactions  . Tape Other (See Comments)    Blisters. Pt tolerates paper tape.      Family History  Problem Relation Age of Onset  . Diabetes Mother   . Pneumonia Father     Prior to Admission medications   Medication Sig Start Date End Date Taking? Authorizing Provider  aspirin EC 81 MG EC tablet Take 1 tablet (81 mg total) by mouth daily. 06/04/18    Purohit, Salli QuarryShrey C, MD  baclofen (LIORESAL) 10 MG tablet Take 1 tablet (10 mg total) by mouth 4 (four) times daily. 06/30/18 08/29/18  Delano MetzNaveira, Francisco, MD  bumetanide (BUMEX) 1 MG tablet Take 1 tablet (1 mg total) by mouth 2 (two) times daily for 30 days. 07/05/18 08/04/18  Parke PoissonAcharya, Gayatri Abram Sax, MD  esomeprazole (NEXIUM) 40 MG capsule Take 1 capsule (40 mg total) by mouth 2 (two) times daily. 06/30/18 08/29/18  Delano MetzNaveira, Francisco, MD  Gabapentin Enacarbil (HORIZANT) 600 MG TBCR Take 1 tablet (600 mg total) by mouth 2 (two) times daily. 06/30/18 08/29/18  Delano MetzNaveira, Francisco, MD  GNP MILK OF MAGNESIA 1200 MG/15ML suspension Take 5 mLs by mouth daily as needed for mild constipation.  05/05/18   [provider]  Magnesium Oxide 500 MG CAPS Take 1 capsule (500 mg total) by mouth 2 (two) times daily at 8 am and 10 pm. 06/30/18 08/29/18  Delano MetzNaveira, Francisco, MD  NON FORMULARY 959 mcg by Intrathecal route daily. IT pump Fentanyl 2,000.0 mcg/ml Bupivicaine 20.0 mg/ml 40 ml pump    [provider]  ondansetron (ZOFRAN-ODT) 8 MG disintegrating tablet Take 1 tablet (8 mg total) by mouth every 8 (eight) hours as needed for nausea or vomiting. 06/30/18 08/29/18  Delano MetzNaveira, Francisco, MD  Oxycodone HCl 10 MG TABS Take 1 tablet (10 mg total) by mouth every 6 (six) hours as needed for up to 30 days. Must last 30 days. Max: 4/day. 06/30/18 07/30/18  Delano MetzNaveira, Francisco, MD  Oxycodone HCl 10 MG TABS Take 1 tablet (10 mg total) by mouth every 6 (six) hours as needed for up to 30 days. Must last 30 days. Max: 4/day. 07/30/18 08/29/18  Delano MetzNaveira, Francisco, MD  polycarbophil (FIBERCON) 625 MG tablet Take 625 mg by mouth 3 (three) times daily.     [provider]  potassium chloride 20 MEQ TBCR Take 40 mEq by mouth daily for 7 days. 06/15/18 06/22/18  Burnadette PopAdhikari, Amrit, MD  promethazine (PHENERGAN) 25 MG suppository Place 25 mg rectally every 6 (six) hours as needed for nausea/vomiting. 06/09/18   [provider]   promethazine (PHENERGAN) 25 MG tablet Take 25 mg by mouth every 8 (eight) hours. 05/12/18   [provider]  thiamine 100 MG tablet Take 1 tablet (100 mg total) by mouth daily. Patient not taking: Reported on 06/30/2018 06/15/18   Burnadette PopAdhikari, Amrit, MD    Physical Exam: Vitals:   07/11/18 1715 07/11/18 1730 07/11/18 1802 07/11/18 1803  BP: 118/64 129/79 (!) 143/66 137/90  Pulse: 75 91 86 89  Resp:  20 (!) 21 20  Temp:      TempSrc:      SpO2: 93% 94% 93% 94%    Constitutional: NAD, calm, comfortable, sleepy Vitals:   07/11/18 1715 07/11/18 1730 07/11/18 1802 07/11/18 1803  BP: 118/64 129/79 (!) 143/66 137/90  Pulse: 75 91 86 89  Resp:  20 (!) 21 20  Temp:      TempSrc:      SpO2: 93% 94% 93% 94%   Eyes: PERRL, lids and conjunctivae normal ENMT: Mucous membranes are moist. Posterior pharynx clear of any exudate or lesions.Normal dentition.  Neck: normal, supple, no masses, no thyromegaly Respiratory: Unlabored breathing on 3 L [place].  Normal respiratory effort. No accessory muscle use.  Cardiovascular: Regular rate and rhythm.  2+ LEE.  Abdomen: no tenderness, no masses palpated. No hepatosplenomegaly. Bowel sounds positive.  Musculoskeletal: no clubbing / cyanosis. No joint deformity upper and lower extremities. Good ROM, no contractures. Normal muscle tone.  Skin: no rashes, lesions, ulcers. No induration Neurologic: CN 2-12 grossly intact. Moves all extremities with equal strength.  No jerking with sleep, but after he woke, had increasing myoclonic jerking of UE>LE.    Psychiatric: Normal judgment and insight. Alert and oriented x 2. Normal mood.   Labs on Admission: I have personally reviewed following labs and imaging studies  CBC: Recent Labs  Lab 07/11/18 1511  WBC 8.6  NEUTROABS 6.3  HGB 13.6  HCT 46.2  MCV 95.1  PLT 201   Basic Metabolic Panel: Recent Labs  Lab 07/11/18 1511 07/11/18 1524  NA 140  --   K 4.2  --   CL 101  --   CO2 32  --   GLUCOSE  140*  --   BUN 6  --   CREATININE 0.84  --   CALCIUM 8.6*  --   MG  --  2.1  PHOS  --  2.3*   GFR: Estimated Creatinine Clearance: 166.4 mL/min (by C-G formula based on SCr of 0.84 mg/dL). Liver Function Tests: Recent Labs  Lab 07/11/18 1511  AST 22  ALT 24  ALKPHOS 86  BILITOT 0.7  PROT 7.2  ALBUMIN 3.6   Recent Labs  Lab 07/11/18 1524  LIPASE 21   Recent Labs  Lab 07/11/18 1529  AMMONIA 50*   Coagulation Profile: Recent Labs  Lab 07/11/18 1524  INR 0.9   Cardiac Enzymes: Recent Labs  Lab 07/11/18 1524  TROPONINI <0.03   BNP (last 3 results) No results for input(s): PROBNP in the last 8760 hours. HbA1C: No results for input(s): HGBA1C in the last 72 hours. CBG: No results for input(s): GLUCAP in the last 168 hours. Lipid Profile: No results for input(s): CHOL, HDL, LDLCALC, TRIG, CHOLHDL, LDLDIRECT in the last 72 hours. Thyroid Function Tests: Recent Labs    07/11/18 1524  TSH 0.938   Anemia Panel: Recent Labs    07/11/18 1524  FERRITIN 156   Urine analysis:    Component Value Date/Time   COLORURINE YELLOW 07/11/2018 1511   APPEARANCEUR CLEAR 07/11/2018 1511   LABSPEC 1.009 07/11/2018 1511   PHURINE 6.0 07/11/2018 1511   GLUCOSEU NEGATIVE 07/11/2018 1511   HGBUR NEGATIVE 07/11/2018 1511   BILIRUBINUR NEGATIVE 07/11/2018 1511   KETONESUR NEGATIVE 07/11/2018 1511   PROTEINUR NEGATIVE 07/11/2018 1511   UROBILINOGEN 0.2 08/13/2010 1206   NITRITE NEGATIVE 07/11/2018 1511   LEUKOCYTESUR NEGATIVE 07/11/2018 1511    Radiological Exams on Admission: Ct Head Wo Contrast  Result Date: 07/11/2018 CLINICAL DATA:  Muscle spasms all over for the last 2 days. 10 falls within the last 2 days. EXAM: CT HEAD WITHOUT CONTRAST CT CERVICAL SPINE WITHOUT CONTRAST TECHNIQUE: Multidetector CT imaging of the head and cervical spine was performed following the standard protocol without intravenous contrast. Multiplanar CT image reconstructions of the cervical  spine were  also generated. COMPARISON:  None. FINDINGS: CT HEAD FINDINGS Brain: No subdural, epidural, or subarachnoid hemorrhage. Cerebellum, brainstem, and basal cisterns are normal. Ventricles and sulci are unremarkable. No mass effect or midline shift. No acute cortical ischemia or infarct. Vascular: No hyperdense vessel or unexpected calcification. Skull: Normal. Negative for fracture or focal lesion. Sinuses/Orbits: No acute finding. Other: None. CT CERVICAL SPINE FINDINGS Alignment: No malalignment. Skull base and vertebrae: No fractures. Soft tissues and spinal canal: No soft tissue abnormalities. Disc levels:  No significant degenerative changes. Upper chest: Only the superior most aspect of the lung apices was imaged with no abnormalities. Other: No other abnormalities. IMPRESSION: 1. No acute intracranial abnormalities. 2. No fracture or traumatic malalignment in the cervical spine. Electronically Signed   By: Gerome Sam III M.D   On: 07/11/2018 16:32   Ct Cervical Spine Wo Contrast  Result Date: 07/11/2018 CLINICAL DATA:  Muscle spasms all over for the last 2 days. 10 falls within the last 2 days. EXAM: CT HEAD WITHOUT CONTRAST CT CERVICAL SPINE WITHOUT CONTRAST TECHNIQUE: Multidetector CT imaging of the head and cervical spine was performed following the standard protocol without intravenous contrast. Multiplanar CT image reconstructions of the cervical spine were also generated. COMPARISON:  None. FINDINGS: CT HEAD FINDINGS Brain: No subdural, epidural, or subarachnoid hemorrhage. Cerebellum, brainstem, and basal cisterns are normal. Ventricles and sulci are unremarkable. No mass effect or midline shift. No acute cortical ischemia or infarct. Vascular: No hyperdense vessel or unexpected calcification. Skull: Normal. Negative for fracture or focal lesion. Sinuses/Orbits: No acute finding. Other: None. CT CERVICAL SPINE FINDINGS Alignment: No malalignment. Skull base and vertebrae: No fractures.  Soft tissues and spinal canal: No soft tissue abnormalities. Disc levels:  No significant degenerative changes. Upper chest: Only the superior most aspect of the lung apices was imaged with no abnormalities. Other: No other abnormalities. IMPRESSION: 1. No acute intracranial abnormalities. 2. No fracture or traumatic malalignment in the cervical spine. Electronically Signed   By: Gerome Sam III M.D   On: 07/11/2018 16:32   Dg Chest Portable 1 View  Result Date: 07/11/2018 CLINICAL DATA:  Abnormal twitching and extremities. EXAM: PORTABLE CHEST 1 VIEW COMPARISON:  June 10, 2018 FINDINGS: Cardiomegaly. The hila and mediastinum are unremarkable. Pulmonary venous congestion without overt edema. No suspicious infiltrates. IMPRESSION: Cardiomegaly and mild pulmonary venous congestion without overt edema. Electronically Signed   By: Gerome Sam III M.D   On: 07/11/2018 15:43    EKG: Independently reviewed. Appears similar to prior, but poor tracing  Assessment/Plan Active Problems:   AMS (altered mental status)   Altered Mental Status  Myoclonic Jerks:  Unclear etiology and broad ddx at this point, possibly related to medications.  Consider related to elevated ammonia, hypoxia and hypercarbia, infection, etoh withdrawal, or serotonin syndrome. He has fever as noted below, but no meningitic findings Olla Delancey&Ox2 at this time Notable w/u includes ammonia of 50 (labs not c/w cirrhosis) and hypercarbia with normal pH UA bland.  Blood cx pending.  Etoh negative.  TSH wnl. Utox positive for opiates. Will start lactulose.  Given normal pH, hold off on bipap, pt also has been sent for COVID r/o as noted below. Medications also possibly contributing.  Will stop lexapro, decrease gabapentin to 300 TID and baclofen to 5 mg QID. Hold home PO opiates at this time.  CIWA protocol Continue to monitor at this time Of note, his myoclonic jerks were seen in previous hospitalization in February and thought 2/2  polypharmacy/OSA/acute on  chronic CHF.  Fever: No sick contacts.  He does have hypoxia, but suspect this is most likely related to HF and obesity hypoventilation.  CXR with cardiomegaly and mild pulmonary venous congestion. Denies recent travel or sick contacts. COVID sent by ED.  LDH elevated, d dimer elevated, CRP elevated.  Negative troponin. No obvious source of infection at this time Follow blood and urine cx.  Observe off abx for now.  Acute Hypoxic Respiratory Failure  HFpEF:  Elevated BNP, CXR with mild pulm venous congestion, LE edema. Suspect HF most likely, must also consider infection in setting of above S/p 40 mg lasix in ED Continue 40 mg BID (on 1 mg bumex BID at home) Follow volume status/creatinine closely Could consider BIPAP in setting of his hypercarbia and AMS as well, but currently compensated with normal pH and sending COVID r/o for now, will hold off for now  Recurrent Falls: CT head/neck without acute findings.  This is 2/2 to his myoclonic jerking (which was discussed previously in February 2020).   PT eval when able.  History of Etoh Use: could be contributing to above.  Last drink on Friday.  Start CIWA protocol.  Lower suspicion based on pt and wife report (only 1-2 drinks Brison Fiumara night), but hx of significant use.  Per last d/c summary, prior to that admission, he was drinking 6-12 beers Jeydi Klingel day.  Chronic Pain: sees Dr. [name] for chronic pain.  Last note on 3/18. Decrease gabapentin and baclofen as noted above Continue intrathecal pump PO oxycodone on hold due to AMS  OSA: not on CPAP at home  Elevated lactate: mild, will repeat.  Low suspicion for sepsis or hypovolemia, pt appears overloaded.  DVT prophylaxis: lovenox  Code Status: full Family Communication: wife over phone  Disposition Plan: pending improvement in mental status  Consults called: discussed with neurology over phone informally Admission status: inpatient, expect greater than 2 midnight stay  given significant AMS with hypoxia and recurrent falls   Lacretia Nicks MD [facility] Pager AMION  If 7PM-7AM, please contact night-coverage [email or web address] Password [facility]  07/11/2018, 6:36 PM

## 2018-07-12 ENCOUNTER — Telehealth: Payer: Self-pay | Admitting: Pain Medicine

## 2018-07-12 ENCOUNTER — Ambulatory Visit: Payer: Self-pay | Admitting: Dietician

## 2018-07-12 LAB — BLOOD CULTURE ID PANEL (REFLEXED)
Acinetobacter baumannii: NOT DETECTED
Candida albicans: NOT DETECTED
Candida glabrata: NOT DETECTED
Candida krusei: NOT DETECTED
Candida parapsilosis: NOT DETECTED
Candida tropicalis: NOT DETECTED
Enterobacter cloacae complex: NOT DETECTED
Enterobacteriaceae species: NOT DETECTED
Enterococcus species: NOT DETECTED
Escherichia coli: NOT DETECTED
Haemophilus influenzae: NOT DETECTED
Klebsiella oxytoca: NOT DETECTED
Klebsiella pneumoniae: NOT DETECTED
Listeria monocytogenes: NOT DETECTED
Methicillin resistance: NOT DETECTED
Neisseria meningitidis: NOT DETECTED
Proteus species: NOT DETECTED
Pseudomonas aeruginosa: NOT DETECTED
SERRATIA MARCESCENS: NOT DETECTED
STAPHYLOCOCCUS AUREUS BCID: NOT DETECTED
Staphylococcus species: DETECTED — AB
Streptococcus agalactiae: NOT DETECTED
Streptococcus pneumoniae: NOT DETECTED
Streptococcus pyogenes: NOT DETECTED
Streptococcus species: NOT DETECTED

## 2018-07-12 LAB — GLUCOSE, CAPILLARY: Glucose-Capillary: 111 mg/dL — ABNORMAL HIGH (ref 70–99)

## 2018-07-12 MED ORDER — HYDROMORPHONE HCL 1 MG/ML IJ SOLN
0.5000 mg | Freq: Once | INTRAMUSCULAR | Status: AC
  Administered 2018-07-12: 0.5 mg via INTRAVENOUS
  Filled 2018-07-12: qty 0.5

## 2018-07-12 MED ORDER — OXYCODONE HCL 5 MG PO TABS
10.0000 mg | ORAL_TABLET | Freq: Four times a day (QID) | ORAL | Status: DC | PRN
  Administered 2018-07-12 – 2018-07-15 (×10): 10 mg via ORAL
  Filled 2018-07-12 (×10): qty 2

## 2018-07-12 MED ORDER — KETOROLAC TROMETHAMINE 30 MG/ML IJ SOLN
30.0000 mg | Freq: Once | INTRAMUSCULAR | Status: DC
  Filled 2018-07-12: qty 1

## 2018-07-12 MED ORDER — MORPHINE SULFATE (PF) 2 MG/ML IV SOLN
2.0000 mg | Freq: Once | INTRAVENOUS | Status: AC
  Administered 2018-07-12: 2 mg via INTRAVENOUS
  Filled 2018-07-12: qty 1

## 2018-07-12 NOTE — Progress Notes (Signed)
Pt c/o pain in back 7/10 provider contacted. V/O received to administer dilaudid 0.5 mg iv once

## 2018-07-12 NOTE — Progress Notes (Addendum)
PROGRESS NOTE    [name]  ZOX:[id number] DOB: 1965/06/04 DOA: 07/11/2018 PCP: [name], MD   Brief Narrative:53 y.o. male with medical history significant of chronic back pain with intrathecal pump, heart failure with preserved ejection fraction, OSA, alcohol abuse, and multiple other medical problems presenting with recurrent falls and altered mental status.   Strays obtained with assistance from the patient's wife as the patient was altered.  She notes that her toes began within the past 24 hours.  Last night she started having jerking of his arms and legs leading him to fall.  She estimates that he fell 10-11 times.  The last time he was unable to get up which is when they called EMS to bring him to the hospital.  Today he seemed more confused.  She notes that this happens on occasion when he does not take his pain medicines, but that normally he does well.  She denies noting that he is recently had any fevers, cough, chest pain, shortness of breath.  She denies any sick contacts or recent travel.  She notes that they have been shelter in place recently.  She does note that he might have an occasional chill.  She states for the past 1 to 2 weeks he has been drinking 1-2 beers a night.  His last drink was last Friday.   night.  No drugs or smoking.  Assessment & Plan:   Active Problems:   AMS (altered mental status)   #1 altered mental status patient admitted with recurrent falls and altered mental status from home.  Patient feels he is at his baseline now.  Wife reports he is not at his baseline he is not answering questions appropriately.  This is probably secondary to combination of hypoxia and hypercarbia and elevated ammonia level.  This patient has obstructive sleep apnea and has not been using his CPAP for 10 years.  He has been taking gabapentin oxycodone and baclofen.  The use of the above medications with not using CPAP must have contributed to his hypercapnia.  His body  habitus is also obese.  He probably has obesity hypoventilation syndrome.  She does not have a history of cirrhosis and his ammonia level was 50 he was started on lactulose.  I will restart Lexapro.  Ambulate him in the room.  Recent hospitalization for myoclonic jerks was thought to be secondary to polypharmacy with obstructive sleep apnea acute on chronic CHF.  He also reported he had fever at home.  T-max here is 100.0.  Chest x-ray showed mild vascular congestion.  COVID-19 sent by ED.  He has elevated LDH CRP and d-dimer and negative troponin.  Patient has been afebrile since admission.  #2 acute hypoxic respiratory failure with preserved ejection fraction elevated BNP with pulmonary vascular congestion by chest x-ray Lasix 40 mg twice a day.  Patient takes Bumex twice a day 1 mg at home.  Continue Lasix while in hospital.  #3 history of alcohol use on CIWA protocol stable at this time.  #4 chronic pain syndrome followed by pain management.  Dose of gabapentin and baclofen has been decreased.  Patient has an intrathecal pump.  Oxycodone on hold due to change in mental status generalized weakness and recurrent falls.INTRATHECAL PUMP HAS FENTANYL...  DVT prophylaxis: lovenox  Code Status: full Family Communication: wife over phone  Disposition Plan: pending improvement in mental status  Consults called:  Admitting physician discussed with neurology over the phone.  Estimated body mass index is 54.27  kg/m as calculated from the following:   Height as of this encounter:  (1.803 m).   Weight as of this encounter: 176.5 kg.   Subjective: Patient resting in bed awake alert answers all my questions appropriately  Objective: Vitals:   07/11/18 2029 07/12/18 0424 07/12/18 1015 07/12/18 1243  BP:  (!) 88/73 (!) 141/75 125/69  Pulse:  66 80 63  Resp:  Temp:  98.1 F (36.7 C) 98.2 F (36.8 C) 99 F (37.2 C)  TempSrc:  Oral Oral Oral  SpO2:  95% 92% 98%  Weight: (!) 176.5  kg     Height:  (1.803 m)       Intake/Output Summary (Last 24 hours) at 07/12/2018 1244 Last data filed at 07/12/2018 1100 Gross per 24 hour  Intake --  Output 1400 ml  Net -1400 ml   Filed Weights   07/11/18 2029  Weight: (!) 176.5 kg    Examination:  General exam: Appears calm and comfortable  Respiratory system: Clear to auscultation. Respiratory effort normal. Cardiovascular system: S1 & S2 heard, RRR. No JVD, murmurs, rubs, gallops or clicks. No pedal edema. Gastrointestinal system: Abdomen is nondistended, soft and nontender. No organomegaly or masses felt. Normal bowel sounds heard. Central nervous system: Alert and oriented. No focal neurological deficits. Extremities: Symmetric 5 x 5 power. Skin: No rashes, lesions or ulcers Psychiatry: Judgement and insight appear normal. Mood & affect appropriate.     Data Reviewed: I have personally reviewed following labs and imaging studies  CBC: Recent Labs  Lab 07/11/18 1511  WBC 8.6  NEUTROABS 6.3  HGB 13.6  HCT 46.2  MCV 95.1  PLT 201   Basic Metabolic Panel: Recent Labs  Lab 07/11/18 1511 07/11/18 1524  NA 140  --   K 4.2  --   CL 101  --   CO2 32  --   GLUCOSE 140*  --   BUN 6  --   CREATININE 0.84  --   CALCIUM 8.6*  --   MG  --  2.1  PHOS  --  2.3*   GFR: Estimated Creatinine Clearance: 170.4 mL/min (by C-G formula based on SCr of 0.84 mg/dL). Liver Function Tests: Recent Labs  Lab 07/11/18 1511  AST 22  ALT 24  ALKPHOS 86  BILITOT 0.7  PROT 7.2  ALBUMIN 3.6   Recent Labs  Lab 07/11/18 1524  LIPASE 21   Recent Labs  Lab 07/11/18 1529  AMMONIA 50*   Coagulation Profile: Recent Labs  Lab 07/11/18 1524  INR 0.9   Cardiac Enzymes: Recent Labs  Lab 07/11/18 1524  TROPONINI <0.03   BNP (last 3 results) No results for input(s): PROBNP in the last 8760 hours. HbA1C: No results for input(s): HGBA1C in the last 72 hours. CBG: No results for input(s): GLUCAP in the last  168 hours. Lipid Profile: No results for input(s): CHOL, HDL, LDLCALC, TRIG, CHOLHDL, LDLDIRECT in the last 72 hours. Thyroid Function Tests: Recent Labs    07/11/18 1524  TSH 0.938   Anemia Panel: Recent Labs    07/11/18 1524  FERRITIN 156   Sepsis Labs: Recent Labs  Lab 07/11/18 1511  LATICACIDVEN 2.2*    No results found for this or any previous visit (from the past 240 hour(s)).       Radiology Studies: Ct Head Wo Contrast  Result Date: 07/11/2018 CLINICAL DATA:  Muscle spasms all over for the last 2 days. 10 falls within the  last 2 days. EXAM: CT HEAD WITHOUT CONTRAST CT CERVICAL SPINE WITHOUT CONTRAST TECHNIQUE: Multidetector CT imaging of the head and cervical spine was performed following the standard protocol without intravenous contrast. Multiplanar CT image reconstructions of the cervical spine were also generated. COMPARISON:  None. FINDINGS: CT HEAD FINDINGS Brain: No subdural, epidural, or subarachnoid hemorrhage. Cerebellum, brainstem, and basal cisterns are normal. Ventricles and sulci are unremarkable. No mass effect or midline shift. No acute cortical ischemia or infarct. Vascular: No hyperdense vessel or unexpected calcification. Skull: Normal. Negative for fracture or focal lesion. Sinuses/Orbits: No acute finding. Other: None. CT CERVICAL SPINE FINDINGS Alignment: No malalignment. Skull base and vertebrae: No fractures. Soft tissues and spinal canal: No soft tissue abnormalities. Disc levels:  No significant degenerative changes. Upper chest: Only the superior most aspect of the lung apices was imaged with no abnormalities. Other: No other abnormalities. IMPRESSION: 1. No acute intracranial abnormalities. 2. No fracture or traumatic malalignment in the cervical spine. Electronically Signed   By: Gerome Sam III M.D   On: 07/11/2018 16:32   Ct Cervical Spine Wo Contrast  Result Date: 07/11/2018 CLINICAL DATA:  Muscle spasms all over for the last 2 days. 10  falls within the last 2 days. EXAM: CT HEAD WITHOUT CONTRAST CT CERVICAL SPINE WITHOUT CONTRAST TECHNIQUE: Multidetector CT imaging of the head and cervical spine was performed following the standard protocol without intravenous contrast. Multiplanar CT image reconstructions of the cervical spine were also generated. COMPARISON:  None. FINDINGS: CT HEAD FINDINGS Brain: No subdural, epidural, or subarachnoid hemorrhage. Cerebellum, brainstem, and basal cisterns are normal. Ventricles and sulci are unremarkable. No mass effect or midline shift. No acute cortical ischemia or infarct. Vascular: No hyperdense vessel or unexpected calcification. Skull: Normal. Negative for fracture or focal lesion. Sinuses/Orbits: No acute finding. Other: None. CT CERVICAL SPINE FINDINGS Alignment: No malalignment. Skull base and vertebrae: No fractures. Soft tissues and spinal canal: No soft tissue abnormalities. Disc levels:  No significant degenerative changes. Upper chest: Only the superior most aspect of the lung apices was imaged with no abnormalities. Other: No other abnormalities. IMPRESSION: 1. No acute intracranial abnormalities. 2. No fracture or traumatic malalignment in the cervical spine. Electronically Signed   By: Gerome Sam III M.D   On: 07/11/2018 16:32   Dg Chest Portable 1 View  Result Date: 07/11/2018 CLINICAL DATA:  Abnormal twitching and extremities. EXAM: PORTABLE CHEST 1 VIEW COMPARISON:  June 10, 2018 FINDINGS: Cardiomegaly. The hila and mediastinum are unremarkable. Pulmonary venous congestion without overt edema. No suspicious infiltrates. IMPRESSION: Cardiomegaly and mild pulmonary venous congestion without overt edema. Electronically Signed   By: Gerome Sam III M.D   On: 07/11/2018 15:43        Scheduled Meds:  aspirin EC  81 mg Oral Daily   baclofen  5 mg Oral QID   enoxaparin (LOVENOX) injection  90 mg Subcutaneous Q24H   folic acid  1 mg Oral Daily   furosemide  40 mg  Intravenous BID   gabapentin  300 mg Oral TID   ketorolac  30 mg Intravenous Once   lactulose  20 g Oral TID   magnesium oxide  400 mg Oral BID AC & HS   multivitamin with minerals  1 tablet Oral Daily   pantoprazole  40 mg Oral Daily   thiamine  100 mg Oral Daily   Or   thiamine  100 mg Intravenous Daily   Continuous Infusions:   LOS: 1 day  Alwyn Ren, MD [facility]  If 7PM-7AM, please contact night-coverage [email or web address] Password [facility] 07/12/2018, 12:44 PM

## 2018-07-12 NOTE — Telephone Encounter (Signed)
Pts wife called to let us know that pt is admitted to [facility] with respiratory and memory issues, pts wife stated that he is being tested for COVID-19. Pts wife stated if we have any questions to call her.

## 2018-07-12 NOTE — Progress Notes (Signed)
PHARMACY - PHYSICIAN COMMUNICATION CRITICAL VALUE ALERT - BLOOD CULTURE IDENTIFICATION (BCID)  [name] is an 53 y.o. male who presented to [facility] on 07/11/2018 with a chief complaint of AMS.  Assessment:  Received BCID call reporting 1/4 GPC on gram stain, BCID identified staph species, no mecA detected.  Name of physician (or Provider) Contacted: Dr. Jerolyn Center  Current antibiotics: None  Changes to prescribed antibiotics recommended: No changes per MD. Possible contaminant, monitor.  Results for orders placed or performed during the hospital encounter of 07/11/18  Blood Culture ID Panel (Reflexed) (Collected: 07/11/2018  4:21 PM)  Result Value Ref Range   Enterococcus species NOT DETECTED NOT DETECTED   Listeria monocytogenes NOT DETECTED NOT DETECTED   Staphylococcus species DETECTED (A) NOT DETECTED   Staphylococcus aureus (BCID) NOT DETECTED NOT DETECTED   Methicillin resistance NOT DETECTED NOT DETECTED   Streptococcus species NOT DETECTED NOT DETECTED   Streptococcus agalactiae NOT DETECTED NOT DETECTED   Streptococcus pneumoniae NOT DETECTED NOT DETECTED   Streptococcus pyogenes NOT DETECTED NOT DETECTED   Acinetobacter baumannii NOT DETECTED NOT DETECTED   Enterobacteriaceae species NOT DETECTED NOT DETECTED   Enterobacter cloacae complex NOT DETECTED NOT DETECTED   Escherichia coli NOT DETECTED NOT DETECTED   Klebsiella oxytoca NOT DETECTED NOT DETECTED   Klebsiella pneumoniae NOT DETECTED NOT DETECTED   Proteus species NOT DETECTED NOT DETECTED   Serratia marcescens NOT DETECTED NOT DETECTED   Haemophilus influenzae NOT DETECTED NOT DETECTED   Neisseria meningitidis NOT DETECTED NOT DETECTED   Pseudomonas aeruginosa NOT DETECTED NOT DETECTED   Candida albicans NOT DETECTED NOT DETECTED   Candida glabrata NOT DETECTED NOT DETECTED   Candida krusei NOT DETECTED NOT DETECTED   Candida parapsilosis NOT DETECTED NOT DETECTED   Candida tropicalis NOT DETECTED NOT  DETECTED    Cindi Carbon, PharmD 07/12/2018  4:59 PM

## 2018-07-13 MED ORDER — CEFAZOLIN SODIUM-DEXTROSE 2-4 GM/100ML-% IV SOLN
2.0000 g | Freq: Three times a day (TID) | INTRAVENOUS | Status: DC
  Administered 2018-07-13 – 2018-07-15 (×7): 2 g via INTRAVENOUS
  Filled 2018-07-13 (×8): qty 100

## 2018-07-13 MED ORDER — SODIUM CHLORIDE 0.9 % IV SOLN
INTRAVENOUS | Status: DC | PRN
  Administered 2018-07-13: 1000 mL via INTRAVENOUS

## 2018-07-13 NOTE — Progress Notes (Signed)
PROGRESS NOTE    [name]  ZYY:[id number] DOB: May 04, 1965 DOA: 07/11/2018 PCP: [name], MD   Brief Narrative:53 y.o.malewith medical history significant ofchronic back pain with intrathecal pump, heart failure with preserved ejection fraction, OSA, alcohol abuse, and multiple other medical problems presenting with recurrent falls and altered mental status.   Strays obtained with assistance from the patient's wife as the patient was altered. She notes that her toes began within the past 24 hours. Last night she started having jerking of his arms and legs leading him to fall. She estimates that he fell 10-11 times. The last time he was unable to get up which is when they called EMS to bring him to the hospital. Today he seemed more confused. She notes that this happens on occasion when he does not take his pain medicines, but that normally he does well. She denies noting that he is recently had any fevers, cough, chest pain, shortness of breath. She denies any sick contacts or recent travel. She notes that they have been shelter in place recently. She does note that he might have an occasional chill. She states for the past 1 to 2 weeks he has been drinking 1-2 beers a night. His last drink was last Friday.night. No drugs or smoking.  Assessment & Plan:   Active Problems:   AMS (altered mental status)  #1 altered mental status patient admitted with recurrent falls and altered mental status from home.  Patient feels he is at his baseline now.  Wife reports he is not at his baseline he is not answering questions appropriately.  This is probably secondary to combination of hypoxia and hypercarbia and elevated ammonia level. Not cirrhotic. This patient has obstructive sleep apnea and has not been using his CPAP for 10 years.  He has been taking gabapentin oxycodone and baclofen.  The use of the above medications with not using CPAP must have contributed to his hypercapnia.   His body habitus is also obese.  He probably has obesity hypoventilation syndrome.  She does not have a history of cirrhosis and his ammonia level was 50 he was started on lactulose.  I will restart Lexapro.  Ambulate him in the room.  Recent hospitalization for myoclonic jerks was thought to be secondary to polypharmacy with obstructive sleep apnea acute on chronic CHF.  He also reported he had fever at home.  T-max here is 100.0.  Chest x-ray showed mild vascular congestion.  COVID-19 sent by ED.  He has elevated LDH CRP and d-dimer and negative troponin.  Patient has been afebrile since admission.  #2 acute hypoxic respiratory failure with preserved ejection fraction elevated BNP with pulmonary vascular congestion by chest x-ray Lasix 40 mg twice a day.  Patient takes Bumex twice a day 1 mg at home.  Continue Lasix while in hospital.  #3  alcohol abuse on CIWA protocol stable at this time.  #4 chronic pain syndrome followed by pain management.  Dose of gabapentin and baclofen has been decreased.  Oxycodone has been restarted as as needed. Patient has an intrathecal pump.  .INTRATHECAL PUMP HAS FENTANYL...  #5 staph species bacteremia now has 2/2 staph species methicillin susceptible coag negative staph.  I will start him on Ancef especially since he has a intrathecal pump in place.  Will follow cultures final sensitivities.  DVT prophylaxis:lovenox Code Status:full Family Communication:wife over phone Disposition Plan:Pending final blood culture sensitivities  consults called: Admitting physician discussed with neurology over the phone  Estimated body mass index is 54.27 kg/m as calculated from the following:   Height as of this encounter:  (1.803 m).   Weight as of this encounter: 176.5 kg.    Subjective: He is resting in bed anxious to go home has chronic pain  Objective: Vitals:   07/12/18 1243 07/12/18 2047 07/13/18 0441 07/13/18 1145  BP: 125/69 102/66  117/73 110/65  Pulse: 63 66 66 68  Resp: Temp: 99 F (37.2 C) 98.4 F (36.9 C) 97.8 F (36.6 C) 97.9 F (36.6 C)  TempSrc: Oral Oral Oral Oral  SpO2: 98% 100% 100% 98%  Weight:      Height:        Intake/Output Summary (Last 24 hours) at 07/13/2018 1157 Last data filed at 07/13/2018 1036 Gross per 24 hour  Intake 960 ml  Output 2820 ml  Net -1860 ml   Filed Weights   07/11/18 2029  Weight: (!) 176.5 kg    Examination:  General exam: Appears calm and comfortable  Respiratory system diminished breath sounds at the bases to auscultation. Respiratory effort normal. Cardiovascular system: S1 & S2 heard, RRR. No JVD, murmurs, rubs, gallops or clicks. No pedal edema. Gastrointestinal system: Abdomen is nondistended, soft and nontender. No organomegaly or masses felt. Normal bowel sounds heard. Central nervous system: Alert and oriented. No focal neurological deficits. Extremities: Symmetric 5 x 5 power. Skin: No rashes, lesions or ulcers    Data Reviewed: I have personally reviewed following labs and imaging studies  CBC: Recent Labs  Lab 07/11/18 1511  WBC 8.6  NEUTROABS 6.3  HGB 13.6  HCT 46.2  MCV 95.1  PLT 201   Basic Metabolic Panel: Recent Labs  Lab 07/11/18 1511 07/11/18 1524  NA 140  --   K 4.2  --   CL 101  --   CO2 32  --   GLUCOSE 140*  --   BUN 6  --   CREATININE 0.84  --   CALCIUM 8.6*  --   MG  --  2.1  PHOS  --  2.3*   GFR: Estimated Creatinine Clearance: 170.4 mL/min (by C-G formula based on SCr of 0.84 mg/dL). Liver Function Tests: Recent Labs  Lab 07/11/18 1511  AST 22  ALT 24  ALKPHOS 86  BILITOT 0.7  PROT 7.2  ALBUMIN 3.6   Recent Labs  Lab 07/11/18 1524  LIPASE 21   Recent Labs  Lab 07/11/18 1529  AMMONIA 50*   Coagulation Profile: Recent Labs  Lab 07/11/18 1524  INR 0.9   Cardiac Enzymes: Recent Labs  Lab 07/11/18 1524  TROPONINI <0.03   BNP (last 3 results) No results for input(s): PROBNP  in the last 8760 hours. HbA1C: No results for input(s): HGBA1C in the last 72 hours. CBG: Recent Labs  Lab 07/12/18 1650  GLUCAP 111*   Lipid Profile: No results for input(s): CHOL, HDL, LDLCALC, TRIG, CHOLHDL, LDLDIRECT in the last 72 hours. Thyroid Function Tests: Recent Labs    07/11/18 1524  TSH 0.938   Anemia Panel: Recent Labs    07/11/18 1524  FERRITIN 156   Sepsis Labs: Recent Labs  Lab 07/11/18 1511  LATICACIDVEN 2.2*    Recent Results (from the past 240 hour(s))  Blood culture (routine x 2)     Status: Abnormal (Preliminary result)   Collection Time: 07/11/18  4:21 PM  Result Value Ref Range Status   Specimen Description   Final    BLOOD  BLOOD RIGHT HAND Performed at [facility], [address] [place], [place] [address]    Special Requests   Final    BOTTLES DRAWN AEROBIC AND ANAEROBIC Blood Culture adequate volume Performed at [facility], [address], [place], [place] [address]    Culture  Setup Time   Final    GRAM POSITIVE COCCI IN BOTH AEROBIC AND ANAEROBIC BOTTLES CRITICAL RESULT CALLED TO, READ BACK BY AND VERIFIED WITH: Janine Limbo PHARMD [id number] 07/12/18 A BROWNING    Culture (A)  Final    STAPHYLOCOCCUS HOMINIS SUSCEPTIBILITIES TO FOLLOW Performed at [facility], [address] [place], [place] [address]    Report Status PENDING  Incomplete  Blood Culture ID Panel (Reflexed)     Status: Abnormal   Collection Time: 07/11/18  4:21 PM  Result Value Ref Range Status   Enterococcus species NOT DETECTED NOT DETECTED Final   Listeria monocytogenes NOT DETECTED NOT DETECTED Final   Staphylococcus species DETECTED (A) NOT DETECTED Final    Comment: Methicillin (oxacillin) susceptible coagulase negative staphylococcus. Possible blood culture contaminant (unless isolated from more than one blood culture draw or clinical case suggests pathogenicity). No antibiotic treatment is indicated for blood    culture contaminants. CRITICAL RESULT CALLED TO, READ BACK BY AND VERIFIED WITH: Janine Limbo PHARMD [id number] 07/12/18 A BROWNING    Staphylococcus aureus (BCID) NOT DETECTED NOT DETECTED Final   Methicillin resistance NOT DETECTED NOT DETECTED Final   Streptococcus species NOT DETECTED NOT DETECTED Final   Streptococcus agalactiae NOT DETECTED NOT DETECTED Final   Streptococcus pneumoniae NOT DETECTED NOT DETECTED Final   Streptococcus pyogenes NOT DETECTED NOT DETECTED Final   Acinetobacter baumannii NOT DETECTED NOT DETECTED Final   Enterobacteriaceae species NOT DETECTED NOT DETECTED Final   Enterobacter cloacae complex NOT DETECTED NOT DETECTED Final   Escherichia coli NOT DETECTED NOT DETECTED Final   Klebsiella oxytoca NOT DETECTED NOT DETECTED Final   Klebsiella pneumoniae NOT DETECTED NOT DETECTED Final   Proteus species NOT DETECTED NOT DETECTED Final   Serratia marcescens NOT DETECTED NOT DETECTED Final   Haemophilus influenzae NOT DETECTED NOT DETECTED Final   Neisseria meningitidis NOT DETECTED NOT DETECTED Final   Pseudomonas aeruginosa NOT DETECTED NOT DETECTED Final   Candida albicans NOT DETECTED NOT DETECTED Final   Candida glabrata NOT DETECTED NOT DETECTED Final   Candida krusei NOT DETECTED NOT DETECTED Final   Candida parapsilosis NOT DETECTED NOT DETECTED Final   Candida tropicalis NOT DETECTED NOT DETECTED Final    Comment: Performed at [facility], [address] [place], [place] [address]         Radiology Studies: Ct Head Wo Contrast  Result Date: 07/11/2018 CLINICAL DATA:  Muscle spasms all over for the last 2 days. 10 falls within the last 2 days. EXAM: CT HEAD WITHOUT CONTRAST CT CERVICAL SPINE WITHOUT CONTRAST TECHNIQUE: Multidetector CT imaging of the head and cervical spine was performed following the standard protocol without intravenous contrast. Multiplanar CT image reconstructions of the cervical spine were also generated. COMPARISON:  None.  FINDINGS: CT HEAD FINDINGS Brain: No subdural, epidural, or subarachnoid hemorrhage. Cerebellum, brainstem, and basal cisterns are normal. Ventricles and sulci are unremarkable. No mass effect or midline shift. No acute cortical ischemia or infarct. Vascular: No hyperdense vessel or unexpected calcification. Skull: Normal. Negative for fracture or focal lesion. Sinuses/Orbits: No acute finding. Other: None. CT CERVICAL SPINE FINDINGS Alignment: No malalignment. Skull base and vertebrae:  No fractures. Soft tissues and spinal canal: No soft tissue abnormalities. Disc levels:  No significant degenerative changes. Upper chest: Only the superior most aspect of the lung apices was imaged with no abnormalities. Other: No other abnormalities. IMPRESSION: 1. No acute intracranial abnormalities. 2. No fracture or traumatic malalignment in the cervical spine. Electronically Signed   By: Gerome Sam III M.D   On: 07/11/2018 16:32   Ct Cervical Spine Wo Contrast  Result Date: 07/11/2018 CLINICAL DATA:  Muscle spasms all over for the last 2 days. 10 falls within the last 2 days. EXAM: CT HEAD WITHOUT CONTRAST CT CERVICAL SPINE WITHOUT CONTRAST TECHNIQUE: Multidetector CT imaging of the head and cervical spine was performed following the standard protocol without intravenous contrast. Multiplanar CT image reconstructions of the cervical spine were also generated. COMPARISON:  None. FINDINGS: CT HEAD FINDINGS Brain: No subdural, epidural, or subarachnoid hemorrhage. Cerebellum, brainstem, and basal cisterns are normal. Ventricles and sulci are unremarkable. No mass effect or midline shift. No acute cortical ischemia or infarct. Vascular: No hyperdense vessel or unexpected calcification. Skull: Normal. Negative for fracture or focal lesion. Sinuses/Orbits: No acute finding. Other: None. CT CERVICAL SPINE FINDINGS Alignment: No malalignment. Skull base and vertebrae: No fractures. Soft tissues and spinal canal: No soft tissue  abnormalities. Disc levels:  No significant degenerative changes. Upper chest: Only the superior most aspect of the lung apices was imaged with no abnormalities. Other: No other abnormalities. IMPRESSION: 1. No acute intracranial abnormalities. 2. No fracture or traumatic malalignment in the cervical spine. Electronically Signed   By: Gerome Sam III M.D   On: 07/11/2018 16:32   Dg Chest Portable 1 View  Result Date: 07/11/2018 CLINICAL DATA:  Abnormal twitching and extremities. EXAM: PORTABLE CHEST 1 VIEW COMPARISON:  June 10, 2018 FINDINGS: Cardiomegaly. The hila and mediastinum are unremarkable. Pulmonary venous congestion without overt edema. No suspicious infiltrates. IMPRESSION: Cardiomegaly and mild pulmonary venous congestion without overt edema. Electronically Signed   By: Gerome Sam III M.D   On: 07/11/2018 15:43        Scheduled Meds:  aspirin EC  81 mg Oral Daily   baclofen  5 mg Oral QID   enoxaparin (LOVENOX) injection  90 mg Subcutaneous Q24H   folic acid  1 mg Oral Daily   furosemide  40 mg Intravenous BID   gabapentin  300 mg Oral TID   ketorolac  30 mg Intravenous Once   lactulose  20 g Oral TID   magnesium oxide  400 mg Oral BID AC & HS   multivitamin with minerals  1 tablet Oral Daily   pantoprazole  40 mg Oral Daily   thiamine  100 mg Oral Daily   Or   thiamine  100 mg Intravenous Daily   Continuous Infusions:  sodium chloride 1,000 mL (07/13/18 1150)    ceFAZolin (ANCEF) IV 2 g (07/13/18 1152)     LOS: 2 days     Alwyn Ren, MD [facility]  If 7PM-7AM, please contact night-coverage [email or web address] Password [facility] 07/13/2018, 11:57 AM

## 2018-07-14 DIAGNOSIS — J9601 Acute respiratory failure with hypoxia: Principal | ICD-10-CM

## 2018-07-14 DIAGNOSIS — J9602 Acute respiratory failure with hypercapnia: Secondary | ICD-10-CM

## 2018-07-14 LAB — NOVEL CORONAVIRUS, NAA (HOSP ORDER, SEND-OUT TO REF LAB; TAT 18-24 HRS): SARS-CoV-2, NAA: NOT DETECTED

## 2018-07-14 LAB — CULTURE, BLOOD (ROUTINE X 2): Special Requests: ADEQUATE

## 2018-07-14 MED ORDER — ACETAMINOPHEN 325 MG PO TABS
650.0000 mg | ORAL_TABLET | Freq: Four times a day (QID) | ORAL | Status: DC | PRN
  Administered 2018-07-14 – 2018-07-15 (×4): 650 mg via ORAL
  Filled 2018-07-14 (×4): qty 2

## 2018-07-14 NOTE — Progress Notes (Signed)
[facility] PROGRESS NOTE  [name] YOV:[id number] DOB: Dec 13, 1965 DOA: 07/11/2018  PCP: [name], MD  Brief History/Interval Summary: 53 y.o.malewith medical history significant ofchronic back pain with intrathecal pump, heart failure with preserved ejection fraction, OSA, alcohol abuse, and multiple other medical problems presenting with recurrent falls and altered mental status.    Reason for Visit: Acute metabolic encephalopathy  Consultants: None  Procedures: None  Antibiotics: Ancef  Subjective/Interval History: Patient states that he is feeling better.  He is not having any more episodes of twitching and jerking movements that he was experiencing previously.  Denies any back pain more than usual.  ROS: Denies any cough or shortness of breath  Objective:  Vital Signs  Vitals:   07/13/18 1430 07/13/18 1723 07/13/18 2118 07/14/18 0517  BP: 126/75 104/66 111/60 115/83  Pulse: 64 66 67 62  Resp: 18 17 20 18   Temp: 98.5 F (36.9 C) 98.4 F (36.9 C) 98.3 F (36.8 C) 98.3 F (36.8 C)  TempSrc: Oral Oral Oral Oral  SpO2: 100% 100% 100% 100%  Weight:      Height:        Intake/Output Summary (Last 24 hours) at 07/14/2018 1321 Last data filed at 07/14/2018 1214 Gross per 24 hour  Intake 720 ml  Output 5325 ml  Net -4605 ml   Filed Weights   07/11/18 2029  Weight: (!) 176.5 kg    General appearance: alert, cooperative, appears stated age, no distress and morbidly obese Head: Normocephalic, without obvious abnormality, atraumatic Resp: clear to auscultation bilaterally Cardio: regular rate and rhythm, S1, S2 normal, no murmur, click, rub or gallop GI: soft, non-tender; bowel sounds normal; no masses,  no organomegaly Extremities: extremities normal, atraumatic, no cyanosis or edema Pulses: 2+ and symmetric Skin: Excoriations noted in the back.  But no infected area appreciated. Neurologic: Alert and oriented x3.  Cranial nerves II through  XII intact.  Motor strength equal bilateral upper and lower extremities.  Lab Results:  Data Reviewed: I have personally reviewed following labs and imaging studies  CBC: Recent Labs  Lab 07/11/18 1511  WBC 8.6  NEUTROABS 6.3  HGB 13.6  HCT 46.2  MCV 95.1  PLT 201    Basic Metabolic Panel: Recent Labs  Lab 07/11/18 1511 07/11/18 1524  NA 140  --   K 4.2  --   CL 101  --   CO2 32  --   GLUCOSE 140*  --   BUN 6  --   CREATININE 0.84  --   CALCIUM 8.6*  --   MG  --  2.1  PHOS  --  2.3*    GFR: Estimated Creatinine Clearance: 170.4 mL/min (by C-G formula based on SCr of 0.84 mg/dL).  Liver Function Tests: Recent Labs  Lab 07/11/18 1511  AST 22  ALT 24  ALKPHOS 86  BILITOT 0.7  PROT 7.2  ALBUMIN 3.6    Recent Labs  Lab 07/11/18 1524  LIPASE 21   Recent Labs  Lab 07/11/18 1529  AMMONIA 50*    Coagulation Profile: Recent Labs  Lab 07/11/18 1524  INR 0.9    Cardiac Enzymes: Recent Labs  Lab 07/11/18 1524  TROPONINI <0.03    CBG: Recent Labs  Lab 07/12/18 1650  GLUCAP 111*    Thyroid Function Tests: Recent Labs    07/11/18 1524  TSH 0.938    Anemia Panel: Recent Labs    07/11/18 1524  FERRITIN 156    Recent Results (  from the past 240 hour(s))  Blood culture (routine x 2)     Status: Abnormal   Collection Time: 07/11/18  4:21 PM  Result Value Ref Range Status   Specimen Description   Final    BLOOD BLOOD RIGHT HAND Performed at [facility], [address] [place], [place] [address]    Special Requests   Final    BOTTLES DRAWN AEROBIC AND ANAEROBIC Blood Culture adequate volume Performed at [facility] [address] [place], [place] [address]    Culture  Setup Time   Final    GRAM POSITIVE COCCI IN BOTH AEROBIC AND ANAEROBIC BOTTLES CRITICAL RESULT CALLED TO, READ BACK BY AND VERIFIED WITH: Janine Limbo [facility] [id number] 07/12/18 A BROWNING Performed at [facility], [address], [place], [place] [address]    Culture STAPHYLOCOCCUS HOMINIS (A)  Final   Report Status 07/14/2018 FINAL  Final   Organism ID, Bacteria STAPHYLOCOCCUS HOMINIS  Final      Susceptibility   Staphylococcus hominis - MIC*    CIPROFLOXACIN <=0.5 SENSITIVE Sensitive     ERYTHROMYCIN >=8 RESISTANT Resistant     GENTAMICIN <=0.5 SENSITIVE Sensitive     OXACILLIN <=0.25 SENSITIVE Sensitive     TETRACYCLINE >=16 RESISTANT Resistant     VANCOMYCIN <=0.5 SENSITIVE Sensitive     TRIMETH/SULFA <=10 SENSITIVE Sensitive     CLINDAMYCIN <=0.25 SENSITIVE Sensitive     RIFAMPIN <=0.5 SENSITIVE Sensitive     Inducible Clindamycin NEGATIVE Sensitive     * STAPHYLOCOCCUS HOMINIS  Blood Culture ID Panel (Reflexed)     Status: Abnormal   Collection Time: 07/11/18  4:21 PM  Result Value Ref Range Status   Enterococcus species NOT DETECTED NOT DETECTED Final   Listeria monocytogenes NOT DETECTED NOT DETECTED Final   Staphylococcus species DETECTED (A) NOT DETECTED Final    Comment: Methicillin (oxacillin) susceptible coagulase negative staphylococcus. Possible blood culture contaminant (unless isolated from more than one blood culture draw or clinical case suggests pathogenicity). No antibiotic treatment is indicated for blood  culture contaminants. CRITICAL RESULT CALLED TO, READ BACK BY AND VERIFIED WITH: Janine Limbo PHARMD 2035 07/12/18 A BROWNING    Staphylococcus aureus (BCID) NOT DETECTED NOT DETECTED Final   Methicillin resistance NOT DETECTED NOT DETECTED Final   Streptococcus species NOT DETECTED NOT DETECTED Final   Streptococcus agalactiae NOT DETECTED NOT DETECTED Final   Streptococcus pneumoniae NOT DETECTED NOT DETECTED Final   Streptococcus pyogenes NOT DETECTED NOT DETECTED Final   Acinetobacter baumannii NOT DETECTED NOT DETECTED Final   Enterobacteriaceae species NOT DETECTED NOT DETECTED Final   Enterobacter cloacae complex NOT DETECTED NOT DETECTED Final   Escherichia coli NOT  DETECTED NOT DETECTED Final   Klebsiella oxytoca NOT DETECTED NOT DETECTED Final   Klebsiella pneumoniae NOT DETECTED NOT DETECTED Final   Proteus species NOT DETECTED NOT DETECTED Final   Serratia marcescens NOT DETECTED NOT DETECTED Final   Haemophilus influenzae NOT DETECTED NOT DETECTED Final   Neisseria meningitidis NOT DETECTED NOT DETECTED Final   Pseudomonas aeruginosa NOT DETECTED NOT DETECTED Final   Candida albicans NOT DETECTED NOT DETECTED Final   Candida glabrata NOT DETECTED NOT DETECTED Final   Candida krusei NOT DETECTED NOT DETECTED Final   Candida parapsilosis NOT DETECTED NOT DETECTED Final   Candida tropicalis NOT DETECTED NOT DETECTED Final    Comment: Performed at [facility], [address], [place], [place] [address]  Novel Coronavirus, NAA (hospital  order; send-out to ref lab)     Status: None   Collection Time: 07/11/18  4:37 PM  Result Value Ref Range Status   SARS-CoV-2, NAA CANC NOT DETECTED Final    Comment: (NOTE) Test cancelled at client's request.      Cancelled per Bevely Palmer 07/14/2018. Performed At: [facility] [address] [place], [place] [id number] Jolene Schimke MD WU:[id number]    Coronavirus Source NASOPHARYNGEAL  Final    Comment: Performed at [facility], [address] [place], [place] [address]      Radiology Studies: No results found.   Medications:  Scheduled: . aspirin EC  81 mg Oral Daily  . baclofen  5 mg Oral QID  . enoxaparin (LOVENOX) injection  90 mg Subcutaneous Q24H  . folic acid  1 mg Oral Daily  . furosemide  40 mg Intravenous BID  . gabapentin  300 mg Oral TID  . ketorolac  30 mg Intravenous Once  . lactulose  20 g Oral TID  . magnesium oxide  400 mg Oral BID AC & HS  . multivitamin with minerals  1 tablet Oral Daily  . pantoprazole  40 mg Oral Daily  . thiamine  100 mg Oral Daily   Or  . thiamine  100 mg Intravenous Daily   Continuous: . sodium chloride Stopped  (07/13/18 1231)  .  ceFAZolin (ANCEF) IV 2 g (07/14/18 1210)   HYQ:MVHQIO chloride, acetaminophen, LORazepam **OR** LORazepam, oxyCODONE    Assessment/Plan:  PPE Date: 07/14/2018  Patient Isolation: Droplet+Contact  HCP PPE:  wearing a facemask wearing gown & gloves wearing eye protection Patient PPE: None   Acute metabolic encephalopathy Patient admitted with recurrent falls and altered mental status at home.  Apparently he was not at baseline at home.  Thought to be due to combination of hypoxia and hypercapnia and elevated ammonia level.  She apparently has obstructive sleep apnea and had not been using his CPAP for 10 years.  He had been taking gabapentin oxycodone and baclofen.  He is morbidly obese.  Likely has obesity hypoventilation syndrome as well.  Patient was started on lactulose for ammonia level of 50.  His mentation is back to baseline.  His jerking movements have also resolved.  Suspected COVID 19 He reported to the emergency department staff that he had fever at home.  He had a T-max here of 100.0 F.  Chest x-ray did not show any clear infiltrates.  He did have mildly elevated LDH.  Ferritin was normal.  CRP 1.8.  Coronavirus test was ordered.  Results are pending.  Acute respiratory failure with hypoxia Reason most likely due to issues mentioned above.  Check room air saturations.  Continue furosemide.  Mildly elevated BNP noted.  Staphylococcus hominis bacteremia Bacteria was found in 2 sets.  Patient does have some excoriations on the skin but no overtly infected area noted.  He has intrathecal pump.  The pump is palpable in the left lower abdomen.  No erythema noted.  Patient was placed on Ancef.  Repeat blood cultures.  Could still be a contaminant.  History of alcohol abuse On CIWA protocol.  Stable.  Chronic pain syndrome Followed by pain management.  He has a intrathecal fentanyl pump.  Stable.  DVT Prophylaxis: Lovenox    Code Status: Full code Family  Communication: Discussed with the patient Disposition Plan: Management as outlined above.  Waiting on the Covid19 test result.    LOS: 3 days   Osvaldo Shipper  [facility] on [email or web address]  07/14/2018, 1:21 PM

## 2018-07-14 NOTE — Plan of Care (Signed)

## 2018-07-14 NOTE — Care Management Important Message (Signed)
Important Message  Patient Details  Name: [name] MRN: [id number] Date of Birth: 28-Dec-1965   Medicare Important Message Given:  Yes    Caren Macadam 07/14/2018, 11:45 AMImportant Message  Patient Details  Name: [name] MRN: [id number] Date of Birth: 09-Sep-1965   Medicare Important Message Given:  Yes    Caren Macadam 07/14/2018, 11:44 AM

## 2018-07-15 ENCOUNTER — Ambulatory Visit: Payer: Self-pay | Admitting: Pain Medicine

## 2018-07-15 DIAGNOSIS — G8929 Other chronic pain: Secondary | ICD-10-CM

## 2018-07-15 DIAGNOSIS — Z9119 Patient's noncompliance with other medical treatment and regimen: Secondary | ICD-10-CM

## 2018-07-15 DIAGNOSIS — G894 Chronic pain syndrome: Secondary | ICD-10-CM

## 2018-07-15 DIAGNOSIS — Z91048 Other nonmedicinal substance allergy status: Secondary | ICD-10-CM

## 2018-07-15 DIAGNOSIS — R7881 Bacteremia: Secondary | ICD-10-CM

## 2018-07-15 DIAGNOSIS — Z978 Presence of other specified devices: Secondary | ICD-10-CM

## 2018-07-15 DIAGNOSIS — G4733 Obstructive sleep apnea (adult) (pediatric): Secondary | ICD-10-CM

## 2018-07-15 LAB — BASIC METABOLIC PANEL
Anion gap: 11 (ref 5–15)
BUN: 14 mg/dL (ref 6–20)
CO2: 32 mmol/L (ref 22–32)
Calcium: 9.1 mg/dL (ref 8.9–10.3)
Chloride: 93 mmol/L — ABNORMAL LOW (ref 98–111)
Creatinine, Ser: 0.78 mg/dL (ref 0.61–1.24)
GFR calc Af Amer: >60 mL/min (ref >60)
GFR calc non Af Amer: >60 mL/min (ref >60)
Glucose, Bld: 124 mg/dL — ABNORMAL HIGH (ref 70–99)
Potassium: 4.5 mmol/L (ref 3.5–5.1)
Sodium: 136 mmol/L (ref 135–145)

## 2018-07-15 LAB — CBC
HCT: 50.4 % (ref 39.0–52.0)
Hemoglobin: 14.9 g/dL (ref 13.0–17.0)
MCH: 27.3 pg (ref 26.0–34.0)
MCHC: 29.6 g/dL — ABNORMAL LOW (ref 30.0–36.0)
MCV: 92.5 fL (ref 80.0–100.0)
Platelets: 220 10*3/uL (ref 150–400)
RBC: 5.45 MIL/uL (ref 4.22–5.81)
RDW: 17.2 % — ABNORMAL HIGH (ref 11.5–15.5)
WBC: 5.2 10*3/uL (ref 4.0–10.5)
nRBC: 0 % (ref 0.0–0.2)

## 2018-07-15 MED ORDER — FUROSEMIDE 40 MG PO TABS: 40.0000 mg | ORAL_TABLET | Freq: Two times a day (BID) | ORAL | Status: DC

## 2018-07-15 MED ORDER — ENOXAPARIN SODIUM 80 MG/0.8ML ~~LOC~~ SOLN: 80.0000 mg | SUBCUTANEOUS | Status: DC

## 2018-07-15 MED ORDER — ONDANSETRON HCL 4 MG/2ML IJ SOLN: 4.0000 mg | Freq: Four times a day (QID) | INTRAMUSCULAR | Status: DC | PRN

## 2018-07-15 NOTE — Progress Notes (Signed)
Pt discharged to home instruction reviewed with patient. Pt acknowledged understanding. SRP, RN

## 2018-07-15 NOTE — Consult Note (Signed)
[facility]       Reason for Consult: positive blood culture    Referring Physician: Dr. Rito Ehrlich  Active Problems:   AMS (altered mental status)   . aspirin EC  81 mg Oral Daily  . baclofen  5 mg Oral QID  . enoxaparin (LOVENOX) injection  80 mg Subcutaneous Q24H  . folic acid  1 mg Oral Daily  . furosemide  40 mg Oral BID  . gabapentin  300 mg Oral TID  . ketorolac  30 mg Intravenous Once  . lactulose  20 g Oral TID  . magnesium oxide  400 mg Oral BID AC & HS  . multivitamin with minerals  1 tablet Oral Daily  . pantoprazole  40 mg Oral Daily  . thiamine  100 mg Oral Daily   Or  . thiamine  100 mg Intravenous Daily    Recommendations: Stop antibiotics   Assessment: He came in with encephalopathy with multiple etiologies mainly related to ammonia, OSA and medications.  1/1 sets of blood cultures with CoNS.   He has had no significant fever (Tmax 100.9), normal WBC.  Ruled out for COVID.  No concerns with his pump. With no active signs of infection of the pump and another reason why he came to the hospital, I do not feel further evaluation is indicated.   Thanks for consultation  Antibiotics: cefazolin  HPI: [name] is a 53 y.o. male with OSA, chronic pain with Medtronic intrathecal pump who came in altered.  With fever COVID testing was sent and was negative.  He otherwise has had no fever since his initial one at admission.  He has improved with lactulose.  He is non-compliant with CPAP.  He has no complaints now.   No associated chills, n/v.   Review of Systems:  Constitutional: negative for fevers, chills and anorexia Respiratory: negative for cough or sputum Integument/breast: negative for rash All other systems reviewed and are negative    Past Medical History:  Diagnosis Date  . Arthritis   . Asthma    as child  . Back pain   . CHF (congestive heart failure) (HCC)   . GERD (gastroesophageal reflux disease)   .  Hernia   . History of hiatal hernia   . Hypogonadism in male   . Neuromuscular disorder (HCC)    back injury with surgery  . PONV (postoperative nausea and vomiting)    gets nausea  . Shortness of breath dyspnea   . Sleep apnea    Does not use CPAP, new equipment 12/2014  . Ulcer   . Wears glasses     Social History   Tobacco Use  . Smoking status: Never Smoker  . Smokeless tobacco: Current User    Types: Snuff  Substance Use Topics  . Alcohol use: Yes    Comment: 2-7 beers per day, starting in Oct 2019.  . Drug use: No    Family History  Problem Relation Age of Onset  . Diabetes Mother   . Pneumonia Father     Allergies  Allergen Reactions  . Tape Other (See Comments)    Blisters. Pt tolerates paper tape.      Physical Exam: Constitutional: in no apparent distress and alert  Vitals:   07/15/18 0453 07/15/18 1249  BP: 136/69 111/75  Pulse: 68 81  Resp: 17 18  Temp: 98 F (36.7 C) 98.8 F (37.1 C)  SpO2: 97% 99%   EYES: anicteric  ENMT: no thrush Cardiovascular: Cor RRR Respiratory: CTA B; normal respirtory effort GI: soft, nt; Medtronic site without erythema, no  Musculoskeletal: no pedal edema noted Skin: negatives: no rash Neuro: non-focal  Lab Results  Component Value Date   WBC 5.2 07/15/2018   HGB 14.9 07/15/2018   HCT 50.4 07/15/2018   MCV 92.5 07/15/2018   PLT 220 07/15/2018    Lab Results  Component Value Date   CREATININE 0.78 07/15/2018   BUN 14 07/15/2018   NA 136 07/15/2018   K 4.5 07/15/2018   CL 93 (L) 07/15/2018   CO2 32 07/15/2018    Lab Results  Component Value Date   ALT 24 07/11/2018   AST 22 07/11/2018   ALKPHOS 86 07/11/2018     Microbiology: Recent Results (from the past 240 hour(s))  Blood culture (routine x 2)     Status: Abnormal   Collection Time: 07/11/18  4:21 PM  Result Value Ref Range Status   Specimen Description   Final    BLOOD BLOOD RIGHT HAND Performed at [facility], [address] [place], [place] [address]    Special Requests   Final    BOTTLES DRAWN AEROBIC AND ANAEROBIC Blood Culture adequate volume Performed at [facility], [address], [place], [place] [address]    Culture  Setup Time   Final    GRAM POSITIVE COCCI IN BOTH AEROBIC AND ANAEROBIC BOTTLES CRITICAL RESULT CALLED TO, READ BACK BY AND VERIFIED WITH: Janine Limbo [facility] [id number] 07/12/18 A BROWNING Performed at [facility], [address], [place], [place] [address]    Culture STAPHYLOCOCCUS HOMINIS (A)  Final   Report Status 07/14/2018 FINAL  Final   Organism ID, Bacteria STAPHYLOCOCCUS HOMINIS  Final      Susceptibility   Staphylococcus hominis - MIC*    CIPROFLOXACIN <=0.5 SENSITIVE Sensitive     ERYTHROMYCIN >=8 RESISTANT Resistant     GENTAMICIN <=0.5 SENSITIVE Sensitive     OXACILLIN <=0.25 SENSITIVE Sensitive     TETRACYCLINE >=16 RESISTANT Resistant     VANCOMYCIN <=0.5 SENSITIVE Sensitive     TRIMETH/SULFA <=10 SENSITIVE Sensitive     CLINDAMYCIN <=0.25 SENSITIVE Sensitive     RIFAMPIN <=0.5 SENSITIVE Sensitive     Inducible Clindamycin NEGATIVE Sensitive     * STAPHYLOCOCCUS HOMINIS  Blood Culture ID Panel (Reflexed)     Status: Abnormal   Collection Time: 07/11/18  4:21 PM  Result Value Ref Range Status   Enterococcus species NOT DETECTED NOT DETECTED Final   Listeria monocytogenes NOT DETECTED NOT DETECTED Final   Staphylococcus species DETECTED (A) NOT DETECTED Final    Comment: Methicillin (oxacillin) susceptible coagulase negative staphylococcus. Possible blood culture contaminant (unless isolated from more than one blood culture draw or clinical case suggests pathogenicity). No antibiotic treatment is indicated for blood  culture contaminants. CRITICAL RESULT CALLED TO, READ BACK BY AND VERIFIED WITH: Janine Limbo PHARMD [id number] 07/12/18 A BROWNING    Staphylococcus aureus (BCID) NOT DETECTED NOT DETECTED Final   Methicillin resistance NOT DETECTED  NOT DETECTED Final   Streptococcus species NOT DETECTED NOT DETECTED Final   Streptococcus agalactiae NOT DETECTED NOT DETECTED Final   Streptococcus pneumoniae NOT DETECTED NOT DETECTED Final   Streptococcus pyogenes NOT DETECTED NOT DETECTED Final   Acinetobacter baumannii NOT DETECTED NOT DETECTED Final   Enterobacteriaceae species NOT DETECTED NOT DETECTED Final   Enterobacter cloacae complex NOT DETECTED NOT DETECTED Final   Escherichia coli  NOT DETECTED NOT DETECTED Final   Klebsiella oxytoca NOT DETECTED NOT DETECTED Final   Klebsiella pneumoniae NOT DETECTED NOT DETECTED Final   Proteus species NOT DETECTED NOT DETECTED Final   Serratia marcescens NOT DETECTED NOT DETECTED Final   Haemophilus influenzae NOT DETECTED NOT DETECTED Final   Neisseria meningitidis NOT DETECTED NOT DETECTED Final   Pseudomonas aeruginosa NOT DETECTED NOT DETECTED Final   Candida albicans NOT DETECTED NOT DETECTED Final   Candida glabrata NOT DETECTED NOT DETECTED Final   Candida krusei NOT DETECTED NOT DETECTED Final   Candida parapsilosis NOT DETECTED NOT DETECTED Final   Candida tropicalis NOT DETECTED NOT DETECTED Final    Comment: Performed at [facility], [address], [place], [place] [address]  Novel Coronavirus, NAA (hospital order; send-out to ref lab)     Status: None   Collection Time: 07/11/18  4:37 PM  Result Value Ref Range Status   SARS-CoV-2, NAA CANC NOT DETECTED Final    Comment: (NOTE) Test cancelled at client's request.      Cancelled per Bevely Palmer 07/14/2018. Performed At: [facility] [address] [place], [place] [id number] Jolene Schimke MD JM:[id number]    Coronavirus Source NASOPHARYNGEAL  Final    Comment: Performed at [facility], [address] [place], [place] [address]  Novel Coronavirus, NAA (hospital order; send-out to ref lab)     Status: None   Collection Time: 07/11/18  4:37 PM  Result Value Ref Range Status    SARS-CoV-2, NAA NOT DETECTED NOT DETECTED Final    Comment: Negative (Not Detected) results do not exclude infection caused by SARS CoV 2 and should not be used as the sole basis for treatment or other patient management decisions. Optimum specimen types and timing for peak viral levels during infections caused  by SARS CoV 2 have not been determined. Collection of multiple specimens (types and time points) from the same patient may be necessary to detect the virus. Improper specimen collection and handling, sequence variability underlying assay primers and or probes, or the presence of organisms in  quantities less than the limit of detection of the assay may lead to false negative results. Positive and negative predictive values of testing are highly dependent on prevalence. False negative results are more likely when prevalence of disease is high. (NOTE) The expected result is Negative (Not Detected). The SARS CoV 2 test is intended for the presumptive qualitative  detection of nucleic acid from SARS CoV 2 in upper and lower  respir atory specimens. Testing methodology is real time RT PCR. Test results must be correlated with clinical presentation and  evaluated in the context of other laboratory and epidemiologic data.  Test performance can be affected because the epidemiology and  clinical spectrum of infection caused by SARS CoV 2 is not fully  known. For example, the optimum types of specimens to collect and  when during the course of infection these specimens are most likely  to contain detectable viral RNA may not be known. This test has not been Food and Drug Administration (FDA) cleared or  approved and has been authorized by FDA under an Emergency Use  Authorization (EUA). The test is only authorized for the duration of  the declaration that circumstances exist justifying the authorization  of emergency use of in vitro diagnostic tests for detection and or  diagnosis of SARS CoV 2  under Section 564(b)(1) of the Act, 21 U.S.C.  section [phone] 3(b)(1), unless the  authorization is terminated or   revoked sooner. [facility] is certified under the  Clinical Laboratory Improvement Amendments of 1988 (CLIA), 42 U.S.C.  section [phone], to perform high complexity tests. Performed at [facility] CLIA [id number] [address], [address], [place], [place] [address] Laboratory Director: Turner Daniels, MD    Coronavirus Source NASOPHARYNGEAL  Final    Comment: Performed at [facility], [address] [place], [place] [address]  Culture, blood (Routine X 2) w Reflex to ID Panel     Status: None (Preliminary result)   Collection Time: 07/14/18  1:41 PM  Result Value Ref Range Status   Specimen Description   Final    BLOOD RIGHT ANTECUBITAL Performed at [facility], [address], [place], [place] [address]    Special Requests   Final    BOTTLES DRAWN AEROBIC AND ANAEROBIC Blood Culture adequate volume Performed at [facility], [address] [place], [place] [address]    Culture   Final    NO GROWTH 1 DAY Performed at [facility], [address], [place], [place] [address]    Report Status PENDING  Incomplete  Culture, blood (Routine X 2) w Reflex to ID Panel     Status: None (Preliminary result)   Collection Time: 07/14/18  1:42 PM  Result Value Ref Range Status   Specimen Description   Final    BLOOD BLOOD RIGHT FOREARM Performed at [facility] [place] [facility], [address], [place], [place] [address]    Special Requests   Final    BOTTLES DRAWN AEROBIC ONLY Blood Culture adequate volume Performed at [facility], [address] [place], [place] [address]    Culture   Final    NO GROWTH 1 DAY Performed at [facility], [address], [place], [place] [address]    Report Status PENDING  Incomplete    Gardiner Barefoot, MD [email or web address] 07/15/2018, 3:00 PM

## 2018-07-15 NOTE — Discharge Summary (Signed)
[facility]  Physician Discharge Summary   Patient ID: [name] MRN: [id number] DOB/AGE: 1966/01/17 53 y.o.  Admit date: 07/11/2018 Discharge date: 07/15/2018  PCP: [name], MD  DISCHARGE DIAGNOSES:  Acute metabolic encephalopathy, resolved Acute respiratory failure with hypoxia, resolved Negative COVID-19 test Staph hominis bacteremia, contaminant Chronic pain syndrome  RECOMMENDATIONS FOR OUTPATIENT FOLLOW UP: 1. Follow-up with PCP in 2 to 3 weeks    Home Health: None Equipment/Devices: None  CODE STATUS: Full code  DISCHARGE CONDITION: fair  Diet recommendation: As before  INITIAL HISTORY: 53 y.o.malewith medical history significant ofchronic back pain with intrathecal pump, heart failure with preserved ejection fraction, OSA, alcohol abuse, and multiple other medical problems presenting with recurrent falls and altered mental status.   Consultants: None  Procedures: None   HOSPITAL COURSE:   Acute metabolic encephalopathy Patient admitted with recurrent falls and altered mental status at home.  Apparently he was not at baseline at home.  Thought to be due to combination of hypoxia and hypercapnia and elevated ammonia level.  He apparently has obstructive sleep apnea and had not been using his CPAP for 10 years.  He had been taking gabapentin oxycodone and baclofen.  He is morbidly obese.  Likely has obesity hypoventilation syndrome as well.  Patient was started on lactulose for ammonia level of 50.  His mentation is back to baseline.  His jerking movements have also resolved.    No need to continue lactulose as the patient does not have any liver disease.  Acute respiratory failure with hypoxia Reason most likely due to issues mentioned above.  Now saturating normal on room air.    Suspected COVID 19 He reported to the emergency department staff that he had fever at home.  He had a T-max here of 100.0 F.  Chest x-ray did not show any  clear infiltrates.  He did have mildly elevated LDH.  Ferritin was normal.  CRP 1.8.  Coronavirus test result is negative.    Staphylococcus hominis bacteremia Patient was found to have bacteremia in 1 out of 2 sets.  Due to presence of intrathecal pump case was discussed with infectious disease.  It is felt that this is a contaminant.  Patient has not had any further fevers.  He was initially placed on Ancef which has been discontinued by ID.  Repeat cultures are negative so far.  He does have some skin excoriations but no obvious evidence for any infection.  This could still be a contaminant.    History of alcohol abuse No evidence for withdrawal syndrome currently.  Chronic pain syndrome Followed by pain management.  He has a intrathecal fentanyl pump.  Stable.  Overall stable.  Okay for discharge home today.    PERTINENT LABS:  The results of significant diagnostics from this hospitalization (including imaging, microbiology, ancillary and laboratory) are listed below for reference.    Microbiology: Recent Results (from the past 240 hour(s))  Blood culture (routine x 2)     Status: Abnormal   Collection Time: 07/11/18  4:21 PM  Result Value Ref Range Status   Specimen Description   Final    BLOOD BLOOD RIGHT HAND Performed at [facility], [address] [place], [place] [address]    Special Requests   Final    BOTTLES DRAWN AEROBIC AND ANAEROBIC Blood Culture adequate volume Performed at [facility], [address], [place], [place] [address]    Culture  Setup Time   Final  GRAM POSITIVE COCCI IN BOTH AEROBIC AND ANAEROBIC BOTTLES CRITICAL RESULT CALLED TO, READ BACK BY AND VERIFIED WITHJanine Limbo [facility] [id number] 07/12/18 A BROWNING Performed at [facility], [address], [place], [place] [address]    Culture STAPHYLOCOCCUS HOMINIS (A)  Final   Report Status 07/14/2018 FINAL  Final   Organism ID, Bacteria STAPHYLOCOCCUS  HOMINIS  Final      Susceptibility   Staphylococcus hominis - MIC*    CIPROFLOXACIN <=0.5 SENSITIVE Sensitive     ERYTHROMYCIN >=8 RESISTANT Resistant     GENTAMICIN <=0.5 SENSITIVE Sensitive     OXACILLIN <=0.25 SENSITIVE Sensitive     TETRACYCLINE >=16 RESISTANT Resistant     VANCOMYCIN <=0.5 SENSITIVE Sensitive     TRIMETH/SULFA <=10 SENSITIVE Sensitive     CLINDAMYCIN <=0.25 SENSITIVE Sensitive     RIFAMPIN <=0.5 SENSITIVE Sensitive     Inducible Clindamycin NEGATIVE Sensitive     * STAPHYLOCOCCUS HOMINIS  Blood Culture ID Panel (Reflexed)     Status: Abnormal   Collection Time: 07/11/18  4:21 PM  Result Value Ref Range Status   Enterococcus species NOT DETECTED NOT DETECTED Final   Listeria monocytogenes NOT DETECTED NOT DETECTED Final   Staphylococcus species DETECTED (A) NOT DETECTED Final    Comment: Methicillin (oxacillin) susceptible coagulase negative staphylococcus. Possible blood culture contaminant (unless isolated from more than one blood culture draw or clinical case suggests pathogenicity). No antibiotic treatment is indicated for blood  culture contaminants. CRITICAL RESULT CALLED TO, READ BACK BY AND VERIFIED WITH: Janine Limbo PHARMD [id number] 07/12/18 A BROWNING    Staphylococcus aureus (BCID) NOT DETECTED NOT DETECTED Final   Methicillin resistance NOT DETECTED NOT DETECTED Final   Streptococcus species NOT DETECTED NOT DETECTED Final   Streptococcus agalactiae NOT DETECTED NOT DETECTED Final   Streptococcus pneumoniae NOT DETECTED NOT DETECTED Final   Streptococcus pyogenes NOT DETECTED NOT DETECTED Final   Acinetobacter baumannii NOT DETECTED NOT DETECTED Final   Enterobacteriaceae species NOT DETECTED NOT DETECTED Final   Enterobacter cloacae complex NOT DETECTED NOT DETECTED Final   Escherichia coli NOT DETECTED NOT DETECTED Final   Klebsiella oxytoca NOT DETECTED NOT DETECTED Final   Klebsiella pneumoniae NOT DETECTED NOT DETECTED Final   Proteus species NOT  DETECTED NOT DETECTED Final   Serratia marcescens NOT DETECTED NOT DETECTED Final   Haemophilus influenzae NOT DETECTED NOT DETECTED Final   Neisseria meningitidis NOT DETECTED NOT DETECTED Final   Pseudomonas aeruginosa NOT DETECTED NOT DETECTED Final   Candida albicans NOT DETECTED NOT DETECTED Final   Candida glabrata NOT DETECTED NOT DETECTED Final   Candida krusei NOT DETECTED NOT DETECTED Final   Candida parapsilosis NOT DETECTED NOT DETECTED Final   Candida tropicalis NOT DETECTED NOT DETECTED Final    Comment: Performed at [facility], [address] [place], [place] [address]  Novel Coronavirus, NAA (hospital order; send-out to ref lab)     Status: None   Collection Time: 07/11/18  4:37 PM  Result Value Ref Range Status   SARS-CoV-2, NAA CANC NOT DETECTED Final    Comment: (NOTE) Test cancelled at client's request.      Cancelled per Bevely Palmer 07/14/2018. Performed At: [facility] [address] [place], [place] [id number] Jolene Schimke MD [id number]    Coronavirus Source NASOPHARYNGEAL  Final    Comment: Performed at [facility], [address], [place], [place] [address]  Novel Coronavirus, NAA (hospital order; send-out to ref lab)  Status: None   Collection Time: 07/11/18  4:37 PM  Result Value Ref Range Status   SARS-CoV-2, NAA NOT DETECTED NOT DETECTED Final    Comment: Negative (Not Detected) results do not exclude infection caused by SARS CoV 2 and should not be used as the sole basis for treatment or other patient management decisions. Optimum specimen types and timing for peak viral levels during infections caused  by SARS CoV 2 have not been determined. Collection of multiple specimens (types and time points) from the same patient may be necessary to detect the virus. Improper specimen collection and handling, sequence variability underlying assay primers and or probes, or the presence of organisms in  quantities less  than the limit of detection of the assay may lead to false negative results. Positive and negative predictive values of testing are highly dependent on prevalence. False negative results are more likely when prevalence of disease is high. (NOTE) The expected result is Negative (Not Detected). The SARS CoV 2 test is intended for the presumptive qualitative  detection of nucleic acid from SARS CoV 2 in upper and lower  respir atory specimens. Testing methodology is real time RT PCR. Test results must be correlated with clinical presentation and  evaluated in the context of other laboratory and epidemiologic data.  Test performance can be affected because the epidemiology and  clinical spectrum of infection caused by SARS CoV 2 is not fully  known. For example, the optimum types of specimens to collect and  when during the course of infection these specimens are most likely  to contain detectable viral RNA may not be known. This test has not been Food and Drug Administration (FDA) cleared or  approved and has been authorized by FDA under an Emergency Use  Authorization (EUA). The test is only authorized for the duration of  the declaration that circumstances exist justifying the authorization  of emergency use of in vitro diagnostic tests for detection and or  diagnosis of SARS CoV 2 under Section 564(b)(1) of the Act, 21 U.S.C.  section [phone] 3(b)(1), unless the authorization is terminated or   revoked sooner. [facility] is certified under the  Clinical Laboratory Improvement Amendments of 1988 (CLIA), 42 U.S.C.  section [phone], to perform high complexity tests. Performed at [facility] CLIA [id number] [address], [place], [place] [address] Laboratory Director: Turner Daniels, MD    Coronavirus Source NASOPHARYNGEAL  Final    Comment: Performed at [facility], [address], [place], [place] [address]  Culture, blood  (Routine X 2) w Reflex to ID Panel     Status: None (Preliminary result)   Collection Time: 07/14/18  1:41 PM  Result Value Ref Range Status   Specimen Description   Final    BLOOD RIGHT ANTECUBITAL Performed at [facility], [address], [place], [place] [address]    Special Requests   Final    BOTTLES DRAWN AEROBIC AND ANAEROBIC Blood Culture adequate volume Performed at [facility], [address] [place], [place] [address]    Culture   Final    NO GROWTH 1 DAY Performed at [facility], [address] [place], [place] [address]    Report Status PENDING  Incomplete  Culture, blood (Routine X 2) w Reflex to ID Panel     Status: None (Preliminary result)   Collection Time: 07/14/18  1:42 PM  Result Value Ref Range  Status   Specimen Description   Final    BLOOD BLOOD RIGHT FOREARM Performed at [facility], [address], [place], [place] [address]    Special Requests   Final    BOTTLES DRAWN AEROBIC ONLY Blood Culture adequate volume Performed at [facility] [address], [place], [place] [address]    Culture   Final    NO GROWTH 1 DAY Performed at [facility], [address], [place], [place] [address]    Report Status PENDING  Incomplete     Labs: Basic Metabolic Panel: Recent Labs  Lab 07/11/18 1511 07/11/18 1524 07/15/18 0330  NA 140  --  136  K 4.2  --  4.5  CL 101  --  93*  CO2 32  --  32  GLUCOSE 140*  --  124*  BUN 6  --  14  CREATININE 0.84  --  0.78  CALCIUM 8.6*  --  9.1  MG  --  2.1  --   PHOS  --  2.3*  --    Liver Function Tests: Recent Labs  Lab 07/11/18 1511  AST 22  ALT 24  ALKPHOS 86  BILITOT 0.7  PROT 7.2  ALBUMIN 3.6   Recent Labs  Lab 07/11/18 1524  LIPASE 21   Recent Labs  Lab 07/11/18 1529  AMMONIA 50*   CBC: Recent Labs  Lab 07/11/18 1511 07/15/18 0330  WBC 8.6 5.2  NEUTROABS 6.3  --   HGB 13.6 14.9  HCT 46.2 50.4   MCV 95.1 92.5  PLT 201 220   Cardiac Enzymes: Recent Labs  Lab 07/11/18 1524  TROPONINI <0.03   BNP: BNP (last 3 results) Recent Labs    05/29/18 1825 06/10/18 1038 07/11/18 1648  BNP 155.4* 57.0 348.1*    CBG: Recent Labs  Lab 07/12/18 1650  GLUCAP 111*     IMAGING STUDIES Ct Head Wo Contrast  Result Date: 07/11/2018 CLINICAL DATA:  Muscle spasms all over for the last 2 days. 10 falls within the last 2 days. EXAM: CT HEAD WITHOUT CONTRAST CT CERVICAL SPINE WITHOUT CONTRAST TECHNIQUE: Multidetector CT imaging of the head and cervical spine was performed following the standard protocol without intravenous contrast. Multiplanar CT image reconstructions of the cervical spine were also generated. COMPARISON:  None. FINDINGS: CT HEAD FINDINGS Brain: No subdural, epidural, or subarachnoid hemorrhage. Cerebellum, brainstem, and basal cisterns are normal. Ventricles and sulci are unremarkable. No mass effect or midline shift. No acute cortical ischemia or infarct. Vascular: No hyperdense vessel or unexpected calcification. Skull: Normal. Negative for fracture or focal lesion. Sinuses/Orbits: No acute finding. Other: None. CT CERVICAL SPINE FINDINGS Alignment: No malalignment. Skull base and vertebrae: No fractures. Soft tissues and spinal canal: No soft tissue abnormalities. Disc levels:  No significant degenerative changes. Upper chest: Only the superior most aspect of the lung apices was imaged with no abnormalities. Other: No other abnormalities. IMPRESSION: 1. No acute intracranial abnormalities. 2. No fracture or traumatic malalignment in the cervical spine. Electronically Signed   By: Gerome Sam III M.D   On: 07/11/2018 16:32   Ct Cervical Spine Wo Contrast  Result Date: 07/11/2018 CLINICAL DATA:  Muscle spasms all over for the last 2 days. 10 falls within the last 2 days. EXAM: CT HEAD WITHOUT CONTRAST CT CERVICAL SPINE WITHOUT CONTRAST TECHNIQUE: Multidetector CT imaging of  the head and cervical spine was performed following the standard protocol without intravenous contrast. Multiplanar CT image  reconstructions of the cervical spine were also generated. COMPARISON:  None. FINDINGS: CT HEAD FINDINGS Brain: No subdural, epidural, or subarachnoid hemorrhage. Cerebellum, brainstem, and basal cisterns are normal. Ventricles and sulci are unremarkable. No mass effect or midline shift. No acute cortical ischemia or infarct. Vascular: No hyperdense vessel or unexpected calcification. Skull: Normal. Negative for fracture or focal lesion. Sinuses/Orbits: No acute finding. Other: None. CT CERVICAL SPINE FINDINGS Alignment: No malalignment. Skull base and vertebrae: No fractures. Soft tissues and spinal canal: No soft tissue abnormalities. Disc levels:  No significant degenerative changes. Upper chest: Only the superior most aspect of the lung apices was imaged with no abnormalities. Other: No other abnormalities. IMPRESSION: 1. No acute intracranial abnormalities. 2. No fracture or traumatic malalignment in the cervical spine. Electronically Signed   By: Gerome Sam III M.D   On: 07/11/2018 16:32   Dg Chest Portable 1 View  Result Date: 07/11/2018 CLINICAL DATA:  Abnormal twitching and extremities. EXAM: PORTABLE CHEST 1 VIEW COMPARISON:  June 10, 2018 FINDINGS: Cardiomegaly. The hila and mediastinum are unremarkable. Pulmonary venous congestion without overt edema. No suspicious infiltrates. IMPRESSION: Cardiomegaly and mild pulmonary venous congestion without overt edema. Electronically Signed   By: Gerome Sam III M.D   On: 07/11/2018 15:43    DISCHARGE EXAMINATION: See progress note from earlier today  DISPOSITION: Home  Discharge Instructions    Call MD for:  extreme fatigue   Complete by:  As directed    Call MD for:  persistant dizziness or light-headedness   Complete by:  As directed    Call MD for:  persistant nausea and vomiting   Complete by:  As  directed    Call MD for:  redness, tenderness, or signs of infection (pain, swelling, redness, odor or green/yellow discharge around incision site)   Complete by:  As directed    Call MD for:  severe uncontrolled pain   Complete by:  As directed    Call MD for:  temperature >100.4   Complete by:  As directed    Diet - low sodium heart healthy   Complete by:  As directed    Discharge instructions   Complete by:  As directed    Please seek attention immediately if you develop a fever greater than 100.4 F.  You were cared for by a hospitalist during your hospital stay. If you have any questions about your discharge medications or the care you received while you were in the hospital after you are discharged, you can call the unit and asked to speak with the hospitalist on call if the hospitalist that took care of you is not available. Once you are discharged, your primary care physician will handle any further medical issues. Please note that NO REFILLS for any discharge medications will be authorized once you are discharged, as it is imperative that you return to your primary care physician (or establish a relationship with a primary care physician if you do not have one) for your aftercare needs so that they can reassess your need for medications and monitor your lab values. If you do not have a primary care physician, you can call [phone] for a physician referral.   Increase activity slowly   Complete by:  As directed         Allergies as of 07/15/2018      Reactions   Tape Other (See Comments)   Blisters. Pt tolerates paper tape.       Medication List  TAKE these medications   acetaminophen 500 MG tablet Commonly known as:  TYLENOL Take 1,000 mg by mouth every 6 (six) hours as needed for moderate pain.   aspirin 81 MG EC tablet Take 1 tablet (81 mg total) by mouth daily.   baclofen 10 MG tablet Commonly known as:  LIORESAL Take 1 tablet (10 mg total) by mouth 4 (four) times  daily.   bumetanide 1 MG tablet Commonly known as:  BUMEX Take 1 tablet (1 mg total) by mouth 2 (two) times daily for 30 days.   escitalopram 10 MG tablet Commonly known as:  LEXAPRO Take 10 mg by mouth daily.   esomeprazole 40 MG capsule Commonly known as:  NEXIUM Take 1 capsule (40 mg total) by mouth 2 (two) times daily. What changed:  when to take this   furosemide 40 MG tablet Commonly known as:  LASIX Take 40 mg by mouth daily as needed for fluid.   Gabapentin Enacarbil 600 MG Tbcr Commonly known as:  Horizant Take 1 tablet (600 mg total) by mouth 2 (two) times daily.   GNP Milk of Magnesia 400 MG/5ML suspension Generic drug:  magnesium hydroxide Take 5 mLs by mouth daily as needed for mild constipation.   hydrOXYzine 25 MG tablet Commonly known as:  ATARAX/VISTARIL Take 25 mg by mouth 3 (three) times daily as needed for anxiety.   Magnesium Oxide 500 MG Caps Take 1 capsule (500 mg total) by mouth 2 (two) times daily at 8 am and 10 pm.   NON FORMULARY 959 mcg by Intrathecal route daily. IT pump Fentanyl 2,000.0 mcg/ml Bupivicaine 20.0 mg/ml 40 ml pump   ondansetron 8 MG disintegrating tablet Commonly known as:  ZOFRAN-ODT Take 1 tablet (8 mg total) by mouth every 8 (eight) hours as needed for nausea or vomiting.   Oxycodone HCl 10 MG Tabs Take 1 tablet (10 mg total) by mouth every 6 (six) hours as needed for up to 30 days. Must last 30 days. Max: 4/day. What changed:  Another medication with the same name was removed. Continue taking this medication, and follow the directions you see here.   polycarbophil 625 MG tablet Commonly known as:  FIBERCON Take 625 mg by mouth as needed for mild constipation.   promethazine 25 MG tablet Commonly known as:  PHENERGAN Take 25 mg by mouth every 8 (eight) hours as needed for nausea.   thiamine 100 MG tablet Take 1 tablet (100 mg total) by mouth daily.        Follow-up Information    [name], MD. Schedule  an appointment as soon as possible for a visit in 1 week(s).   Specialty:  Family Medicine Contact information: [phone] [place] [address] [phone]           TOTAL DISCHARGE TIME: 35 minutes  Najai Waszak Rito Ehrlich  [facility] Pager on [email or web address]  07/15/2018, 4:06 PM

## 2018-07-15 NOTE — Progress Notes (Addendum)
[facility] PROGRESS NOTE  [name] ZOX:[id number] DOB: August 09, 1965 DOA: 07/11/2018  PCP: [name], MD  Brief History/Interval Summary: 53 y.o.malewith medical history significant ofchronic back pain with intrathecal pump, heart failure with preserved ejection fraction, OSA, alcohol abuse, and multiple other medical problems presenting with recurrent falls and altered mental status.    Consultants: None  Procedures: None  Antibiotics: Ancef  Subjective/Interval History: Patient states that he is feeling well.  Denies any back pain more than usual.  Denies any shortness of breath.  No fever or chills.    ROS: Denies any cough or shortness of breath.  Objective:  Vital Signs  Vitals:   07/14/18 1419 07/14/18 2015 07/15/18 0453 07/15/18 1249  BP: 126/74 132/88 136/69 111/75  Pulse: 67 88 68 81  Resp: Temp: 98 F (36.7 C) 98.5 F (36.9 C) 98 F (36.7 C) 98.8 F (37.1 C)  TempSrc: Oral Oral Oral Oral  SpO2: 99% 98% 97% 99%  Weight:   (!) 169.4 kg   Height:        Intake/Output Summary (Last 24 hours) at 07/15/2018 1251 Last data filed at 07/15/2018 1103 Gross per 24 hour  Intake 118 ml  Output 5475 ml  Net -5357 ml   Filed Weights   07/11/18 2029 07/15/18 0453  Weight: (!) 176.5 kg (!) 169.4 kg   General appearance: Awake alert.  In no distress.  Morbidly obese. Resp: Clear to auscultation bilaterally.  Normal effort Cardio: S1-S2 is normal regular.  No S3-S4.  No rubs murmurs or bruit GI: Abdomen is soft.  Nontender nondistended.  Bowel sounds are present normal.  No masses organomegaly Extremities: No edema.  Full range of motion of lower extremities. Neurologic: Alert and oriented x3.  No focal neurological deficits.    Lab Results:  Data Reviewed: I have personally reviewed following labs and imaging studies  CBC: Recent Labs  Lab 07/11/18 1511 07/15/18 0330  WBC 8.6 5.2  NEUTROABS 6.3  --   HGB 13.6 14.9  HCT  46.2 50.4  MCV 95.1 92.5  PLT 201 220    Basic Metabolic Panel: Recent Labs  Lab 07/11/18 1511 07/11/18 1524 07/15/18 0330  NA 140  --  136  K 4.2  --  4.5  CL 101  --  93*  CO2 32  --  32  GLUCOSE 140*  --  124*  BUN 6  --  14  CREATININE 0.84  --  0.78  CALCIUM 8.6*  --  9.1  MG  --  2.1  --   PHOS  --  2.3*  --     GFR: Estimated Creatinine Clearance: 174.4 mL/min (by C-G formula based on SCr of 0.78 mg/dL).  Liver Function Tests: Recent Labs  Lab 07/11/18 1511  AST 22  ALT 24  ALKPHOS 86  BILITOT 0.7  PROT 7.2  ALBUMIN 3.6    Recent Labs  Lab 07/11/18 1524  LIPASE 21   Recent Labs  Lab 07/11/18 1529  AMMONIA 50*    Coagulation Profile: Recent Labs  Lab 07/11/18 1524  INR 0.9    Cardiac Enzymes: Recent Labs  Lab 07/11/18 1524  TROPONINI <0.03    CBG: Recent Labs  Lab 07/12/18 1650  GLUCAP 111*     Recent Results (from the past 240 hour(s))  Blood culture (routine x 2)     Status: Abnormal   Collection Time: 07/11/18  4:21 PM  Result Value Ref Range  Status   Specimen Description   Final    BLOOD BLOOD RIGHT HAND Performed at [facility], [address] [place], [place] [address]    Special Requests   Final    BOTTLES DRAWN AEROBIC AND ANAEROBIC Blood Culture adequate volume Performed at [facility], [address], [place], [place] [address]    Culture  Setup Time   Final    GRAM POSITIVE COCCI IN BOTH AEROBIC AND ANAEROBIC BOTTLES CRITICAL RESULT CALLED TO, READ BACK BY AND VERIFIED WITH: Janine Limbo [facility] 9417 07/12/18 A BROWNING Performed at [facility], [address], [place], [place] [address]    Culture STAPHYLOCOCCUS HOMINIS (A)  Final   Report Status 07/14/2018 FINAL  Final   Organism ID, Bacteria STAPHYLOCOCCUS HOMINIS  Final      Susceptibility   Staphylococcus hominis - MIC*    CIPROFLOXACIN <=0.5 SENSITIVE Sensitive     ERYTHROMYCIN >=8 RESISTANT Resistant      GENTAMICIN <=0.5 SENSITIVE Sensitive     OXACILLIN <=0.25 SENSITIVE Sensitive     TETRACYCLINE >=16 RESISTANT Resistant     VANCOMYCIN <=0.5 SENSITIVE Sensitive     TRIMETH/SULFA <=10 SENSITIVE Sensitive     CLINDAMYCIN <=0.25 SENSITIVE Sensitive     RIFAMPIN <=0.5 SENSITIVE Sensitive     Inducible Clindamycin NEGATIVE Sensitive     * STAPHYLOCOCCUS HOMINIS  Blood Culture ID Panel (Reflexed)     Status: Abnormal   Collection Time: 07/11/18  4:21 PM  Result Value Ref Range Status   Enterococcus species NOT DETECTED NOT DETECTED Final   Listeria monocytogenes NOT DETECTED NOT DETECTED Final   Staphylococcus species DETECTED (A) NOT DETECTED Final    Comment: Methicillin (oxacillin) susceptible coagulase negative staphylococcus. Possible blood culture contaminant (unless isolated from more than one blood culture draw or clinical case suggests pathogenicity). No antibiotic treatment is indicated for blood  culture contaminants. CRITICAL RESULT CALLED TO, READ BACK BY AND VERIFIED WITH: Janine Limbo PHARMD [id number] 07/12/18 A BROWNING    Staphylococcus aureus (BCID) NOT DETECTED NOT DETECTED Final   Methicillin resistance NOT DETECTED NOT DETECTED Final   Streptococcus species NOT DETECTED NOT DETECTED Final   Streptococcus agalactiae NOT DETECTED NOT DETECTED Final   Streptococcus pneumoniae NOT DETECTED NOT DETECTED Final   Streptococcus pyogenes NOT DETECTED NOT DETECTED Final   Acinetobacter baumannii NOT DETECTED NOT DETECTED Final   Enterobacteriaceae species NOT DETECTED NOT DETECTED Final   Enterobacter cloacae complex NOT DETECTED NOT DETECTED Final   Escherichia coli NOT DETECTED NOT DETECTED Final   Klebsiella oxytoca NOT DETECTED NOT DETECTED Final   Klebsiella pneumoniae NOT DETECTED NOT DETECTED Final   Proteus species NOT DETECTED NOT DETECTED Final   Serratia marcescens NOT DETECTED NOT DETECTED Final   Haemophilus influenzae NOT DETECTED NOT DETECTED Final   Neisseria  meningitidis NOT DETECTED NOT DETECTED Final   Pseudomonas aeruginosa NOT DETECTED NOT DETECTED Final   Candida albicans NOT DETECTED NOT DETECTED Final   Candida glabrata NOT DETECTED NOT DETECTED Final   Candida krusei NOT DETECTED NOT DETECTED Final   Candida parapsilosis NOT DETECTED NOT DETECTED Final   Candida tropicalis NOT DETECTED NOT DETECTED Final    Comment: Performed at [facility], [address] [place], [place] [address]  Novel Coronavirus, NAA (hospital order; send-out to ref lab)     Status: None   Collection Time: 07/11/18  4:37 PM  Result Value Ref Range Status   SARS-CoV-2, NAA CANC  NOT DETECTED Final    Comment: (NOTE) Test cancelled at client's request.      Cancelled per Bevely Palmer 07/14/2018. Performed At: [facility] [address] [place], [place] [id number] Jolene Schimke MD FS:[id number]    Coronavirus Source NASOPHARYNGEAL  Final    Comment: Performed at [facility], [address] [place], [place] [address]  Novel Coronavirus, NAA (hospital order; send-out to ref lab)     Status: None   Collection Time: 07/11/18  4:37 PM  Result Value Ref Range Status   SARS-CoV-2, NAA NOT DETECTED NOT DETECTED Final    Comment: Negative (Not Detected) results do not exclude infection caused by SARS CoV 2 and should not be used as the sole basis for treatment or other patient management decisions. Optimum specimen types and timing for peak viral levels during infections caused  by SARS CoV 2 have not been determined. Collection of multiple specimens (types and time points) from the same patient may be necessary to detect the virus. Improper specimen collection and handling, sequence variability underlying assay primers and or probes, or the presence of organisms in  quantities less than the limit of detection of the assay may lead to false negative results. Positive and negative predictive values of testing are highly dependent on  prevalence. False negative results are more likely when prevalence of disease is high. (NOTE) The expected result is Negative (Not Detected). The SARS CoV 2 test is intended for the presumptive qualitative  detection of nucleic acid from SARS CoV 2 in upper and lower  respir atory specimens. Testing methodology is real time RT PCR. Test results must be correlated with clinical presentation and  evaluated in the context of other laboratory and epidemiologic data.  Test performance can be affected because the epidemiology and  clinical spectrum of infection caused by SARS CoV 2 is not fully  known. For example, the optimum types of specimens to collect and  when during the course of infection these specimens are most likely  to contain detectable viral RNA may not be known. This test has not been Food and Drug Administration (FDA) cleared or  approved and has been authorized by FDA under an Emergency Use  Authorization (EUA). The test is only authorized for the duration of  the declaration that circumstances exist justifying the authorization  of emergency use of in vitro diagnostic tests for detection and or  diagnosis of SARS CoV 2 under Section 564(b)(1) of the Act, 21 U.S.C.  section [phone] 3(b)(1), unless the authorization is terminated or   revoked sooner. [facility] is certified under the  Clinical Laboratory Improvement Amendments of 1988 (CLIA), 42 U.S.C.  section [phone], to perform high complexity tests. Performed at [facility] CLIA [id number] [address], [place], [place] [address] Laboratory Director: Turner Daniels, MD    Coronavirus Source NASOPHARYNGEAL  Final    Comment: Performed at [facility], [address] [place], [place] [address]      Radiology Studies: No results found.   Medications:  Scheduled: . aspirin EC  81 mg Oral Daily  . baclofen  5 mg Oral QID  . enoxaparin (LOVENOX) injection   80 mg Subcutaneous Q24H  . folic acid  1 mg Oral Daily  . furosemide  40 mg Intravenous BID  . gabapentin  300 mg Oral TID  . ketorolac  30 mg Intravenous Once  . lactulose  20 g Oral TID  .  magnesium oxide  400 mg Oral BID AC & HS  . multivitamin with minerals  1 tablet Oral Daily  . pantoprazole  40 mg Oral Daily  . thiamine  100 mg Oral Daily   Or  . thiamine  100 mg Intravenous Daily   Continuous: . sodium chloride Stopped (07/13/18 1231)  .  ceFAZolin (ANCEF) IV 2 g (07/15/18 1211)   ZOX:WRUEAVPRN:sodium chloride, acetaminophen, ondansetron (ZOFRAN) IV, oxyCODONE    Assessment/Plan:  PPE Date: 07/15/2018  Patient Isolation: Droplet+Contact  HCP PPE:  Face mask, face shield, gown, gloves   Patient PPE: None   Acute metabolic encephalopathy Patient admitted with recurrent falls and altered mental status at home.  Apparently he was not at baseline at home.  Thought to be due to combination of hypoxia and hypercapnia and elevated ammonia level.  He apparently has obstructive sleep apnea and had not been using his CPAP for 10 years.  He had been taking gabapentin oxycodone and baclofen.  He is morbidly obese.  Likely has obesity hypoventilation syndrome as well.  Patient was started on lactulose for ammonia level of 50.  His mentation is back to baseline.  His jerking movements have also resolved.  He is back to his baseline.  Acute respiratory failure with hypoxia Reason most likely due to issues mentioned above. Room air.  Okay to change to oral Lasix.   Suspected COVID 19 He reported to the emergency department staff that he had fever at home.  He had a T-max here of 100.0 F.  Chest x-ray did not show any clear infiltrates.  He did have mildly elevated LDH.  Ferritin was normal.  CRP 1.8.  Coronavirus test result is negative.    Staphylococcus hominis bacteremia Bacteria was found in 1 set and not in 2.  Patient does have a intrathecal pain pump.  He does have some skin  excoriations but no obvious evidence for any infection.  This could still be a contaminant.  Repeat cultures are pending.  Discussed with Dr. Luciana Axeomer with ID who will consult.  Continue Ancef for now.  History of alcohol abuse No evidence for withdrawal syndrome currently.  Chronic pain syndrome Followed by pain management.  He has a intrathecal fentanyl pump.  Stable.  DVT Prophylaxis: Lovenox    Code Status: Full code Family Communication: Discussed with the patient Disposition Plan: Management as outlined above.  His COVID-19 test is negative.  We will need to sort out the bacteremia before he can be discharged.    LOS: 4 days   Kyri Dai [employer]rishnan  [facility] Pager on [email or web address]  07/15/2018, 12:51 PM

## 2018-07-15 NOTE — Progress Notes (Signed)
I have spoken with pt's PCP, nursing staff, reviewed patient's chart. They have an alternative diagnosis and COVID precautions are being d/c.  CXR is (-). COVID (-). Afebrile since adm.

## 2018-07-16 ENCOUNTER — Other Ambulatory Visit: Payer: Self-pay | Admitting: Pain Medicine

## 2018-07-16 DIAGNOSIS — T402X5A Adverse effect of other opioids, initial encounter: Principal | ICD-10-CM

## 2018-07-16 DIAGNOSIS — K5903 Drug induced constipation: Secondary | ICD-10-CM

## 2018-07-19 ENCOUNTER — Encounter (HOSPITAL_BASED_OUTPATIENT_CLINIC_OR_DEPARTMENT_OTHER): Payer: Self-pay

## 2018-07-19 LAB — CULTURE, BLOOD (ROUTINE X 2)
Culture: NO GROWTH
Culture: NO GROWTH
Special Requests: ADEQUATE
Special Requests: ADEQUATE

## 2018-07-21 ENCOUNTER — Telehealth: Payer: Self-pay

## 2018-07-21 NOTE — Telephone Encounter (Signed)
Cardiac Questionnaire:    Since your last visit or hospitalization:    1. Have you been having new or worsening chest pain? NO   2. Have you been having new or worsening shortness of breath? NO 3. Have you been having new or worsening leg swelling, wt gain, or increase in abdominal girth (pants fitting more tightly)? YES   4. Have you had any passing out spells? NO       Virtual Visit Pre-Appointment Phone Call  Steps For Call:  1. Confirm consent - "In the setting of the current Covid19 crisis, you are scheduled for a (phone or video) visit with your provider on (date) at (time).  Just as we do with many in-office visits, in order for you to participate in this visit, we must obtain consent.  If you'd like, I can send this to your mychart (if signed up) or email for you to review.  Otherwise, I can obtain your verbal consent now.  All virtual visits are billed to your insurance company just like a normal visit would be.  By agreeing to a virtual visit, we'd like you to understand that the technology does not allow for your provider to perform an examination, and thus may limit your provider's ability to fully assess your condition.  Finally, though the technology is pretty good, we cannot assure that it will always work on either your or our end, and in the setting of a video visit, we may have to convert it to a phone-only visit.  In either situation, we cannot ensure that we have a secure connection.  Are you willing to proceed?"     TELEPHONE CALL NOTE  STEVIE POISSON has been deemed a candidate for a follow-up tele-health visit to limit community exposure during the Covid-19 pandemic. I spoke with the patient via phone to ensure availability of phone/video source, confirm preferred email & phone number, and discuss instructions and expectations.  I reminded NEVIN WILLENBORG to be prepared with any vital sign and/or heart rhythm information that could potentially be obtained via  home monitoring, at the time of his visit. I reminded NASARIO MASEK to expect a phone call at the time of his visit if his visit.  Did the patient verbally acknowledge consent to treatment? yes  Adline Peals, CMA 07/21/2018 3:35 PM   DOWNLOADING THE WEBEX SOFTWARE TO SMARTPHONE  - If Apple, go to [employer] and type in WebEx in the search bar. Download [employer], the blue/green circle. The app is free but as with any other app downloads, their phone may require them to verify saved payment information or Apple password. The patient does NOT have to create an account.  - If Android, ask patient to go to [employer] and type in WebEx in the search bar. Download [employer], the blue/green circle. The app is free but as with any other app downloads, their phone may require them to verify saved payment information or Android password. The patient does NOT have to create an account.   CONSENT FOR TELE-HEALTH VISIT - PLEASE REVIEW  I hereby voluntarily request, consent and authorize [facility] and its employed or contracted physicians, physician assistants, nurse practitioners or other licensed health care professionals (the Practitioner), to provide me with telemedicine health care services (the "Services") as deemed necessary by the treating Practitioner. I acknowledge and consent to receive the Services by the Practitioner via telemedicine. I understand that the telemedicine visit  will involve communicating with the Practitioner through live audiovisual communication technology and the disclosure of certain medical information by electronic transmission. I acknowledge that I have been given the opportunity to request an in-person assessment or other available alternative prior to the telemedicine visit and am voluntarily participating in the telemedicine visit.  I understand that I have the right to withhold or withdraw my consent to the use of telemedicine in the  course of my care at any time, without affecting my right to future care or treatment, and that the Practitioner or I may terminate the telemedicine visit at any time. I understand that I have the right to inspect all information obtained and/or recorded in the course of the telemedicine visit and may receive copies of available information for a reasonable fee.  I understand that some of the potential risks of receiving the Services via telemedicine include:  Marland Kitchen. Delay or interruption in medical evaluation due to technological equipment failure or disruption; . Information transmitted may not be sufficient (e.g. poor resolution of images) to allow for appropriate medical decision making by the Practitioner; and/or  . In rare instances, security protocols could fail, causing a breach of personal health information.  Furthermore, I acknowledge that it is my responsibility to provide information about my medical history, conditions and care that is complete and accurate to the best of my ability. I acknowledge that Practitioner's advice, recommendations, and/or decision may be based on factors not within their control, such as incomplete or inaccurate data provided by me or distortions of diagnostic images or specimens that may result from electronic transmissions. I understand that the practice of medicine is not an exact science and that Practitioner makes no warranties or guarantees regarding treatment outcomes. I acknowledge that I will receive a copy of this consent concurrently upon execution via email to the email address I last provided but may also request a printed copy by calling the office of [facility].    I understand that my insurance will be billed for this visit.   I have read or had this consent read to me. . I understand the contents of this consent, which adequately explains the benefits and risks of the Services being provided via telemedicine.  . I have been provided ample  opportunity to ask questions regarding this consent and the Services and have had my questions answered to my satisfaction. . I give my informed consent for the services to be provided through the use of telemedicine in my medical care  By participating in this telemedicine visit I agree to the above.  [phone] number to use for visit.

## 2018-07-22 ENCOUNTER — Encounter: Payer: Self-pay | Admitting: Pain Medicine

## 2018-07-26 ENCOUNTER — Encounter (HOSPITAL_BASED_OUTPATIENT_CLINIC_OR_DEPARTMENT_OTHER): Payer: Self-pay

## 2018-07-27 ENCOUNTER — Ambulatory Visit: Payer: No Typology Code available for payment source | Attending: Pain Medicine | Admitting: Pain Medicine

## 2018-07-27 ENCOUNTER — Encounter: Payer: Self-pay | Admitting: Pain Medicine

## 2018-07-27 ENCOUNTER — Other Ambulatory Visit: Payer: Self-pay

## 2018-07-27 VITALS — BP 123/82 | HR 110 | Temp 98.4°F | Resp 18 | Ht 70.5 in | Wt 377.2 lb

## 2018-07-27 DIAGNOSIS — K5903 Drug induced constipation: Secondary | ICD-10-CM | POA: Diagnosis present

## 2018-07-27 DIAGNOSIS — Z978 Presence of other specified devices: Secondary | ICD-10-CM

## 2018-07-27 DIAGNOSIS — M545 Low back pain, unspecified: Secondary | ICD-10-CM

## 2018-07-27 DIAGNOSIS — G894 Chronic pain syndrome: Secondary | ICD-10-CM

## 2018-07-27 DIAGNOSIS — R110 Nausea: Secondary | ICD-10-CM | POA: Diagnosis present

## 2018-07-27 DIAGNOSIS — M961 Postlaminectomy syndrome, not elsewhere classified: Secondary | ICD-10-CM | POA: Diagnosis present

## 2018-07-27 DIAGNOSIS — G8929 Other chronic pain: Secondary | ICD-10-CM | POA: Diagnosis present

## 2018-07-27 DIAGNOSIS — M79605 Pain in left leg: Secondary | ICD-10-CM | POA: Insufficient documentation

## 2018-07-27 DIAGNOSIS — M62838 Other muscle spasm: Secondary | ICD-10-CM

## 2018-07-27 DIAGNOSIS — T402X5A Adverse effect of other opioids, initial encounter: Secondary | ICD-10-CM | POA: Diagnosis present

## 2018-07-27 DIAGNOSIS — M7918 Myalgia, other site: Secondary | ICD-10-CM | POA: Diagnosis present

## 2018-07-27 DIAGNOSIS — K219 Gastro-esophageal reflux disease without esophagitis: Secondary | ICD-10-CM

## 2018-07-27 DIAGNOSIS — Z451 Encounter for adjustment and management of infusion pump: Secondary | ICD-10-CM

## 2018-07-27 DIAGNOSIS — M792 Neuralgia and neuritis, unspecified: Secondary | ICD-10-CM

## 2018-07-27 DIAGNOSIS — M79604 Pain in right leg: Secondary | ICD-10-CM | POA: Insufficient documentation

## 2018-07-27 DIAGNOSIS — M47816 Spondylosis without myelopathy or radiculopathy, lumbar region: Secondary | ICD-10-CM | POA: Diagnosis present

## 2018-07-27 MED ORDER — LUBIPROSTONE 24 MCG PO CAPS: 24.0000 ug | ORAL_CAPSULE | Freq: Two times a day (BID) | ORAL | 1 refills | Status: DC

## 2018-07-27 MED ORDER — OXYCODONE HCL 10 MG PO TABS: 10.0000 mg | ORAL_TABLET | Freq: Four times a day (QID) | ORAL | 0 refills | Status: DC | PRN

## 2018-07-27 MED ORDER — BACLOFEN 10 MG PO TABS: 10.0000 mg | ORAL_TABLET | Freq: Four times a day (QID) | ORAL | 1 refills | Status: DC

## 2018-07-27 MED ORDER — MAGNESIUM OXIDE -MG SUPPLEMENT 500 MG PO CAPS: 1.0000 | ORAL_CAPSULE | Freq: Two times a day (BID) | ORAL | 1 refills | Status: DC

## 2018-07-27 MED ORDER — ESOMEPRAZOLE MAGNESIUM 40 MG PO CPDR: 40.0000 mg | DELAYED_RELEASE_CAPSULE | Freq: Every day | ORAL | 1 refills | Status: DC

## 2018-07-27 MED ORDER — ONDANSETRON 8 MG PO TBDP: 8.0000 mg | ORAL_TABLET | Freq: Three times a day (TID) | ORAL | 0 refills | Status: DC | PRN

## 2018-07-27 MED ORDER — GABAPENTIN ENACARBIL ER 600 MG PO TBCR: 600.0000 mg | EXTENDED_RELEASE_TABLET | Freq: Two times a day (BID) | ORAL | 0 refills | Status: DC

## 2018-07-27 NOTE — Progress Notes (Signed)
Patient's Name: [name]  MRN: [id number]  Referring Provider: Shirline Frees, MD  DOB: 1965-06-12  PCP: Antony Contras, MD  DOS: 07/27/2018  Note by: Gaspar Cola, MD  Service setting: Ambulatory outpatient  Specialty: Interventional Pain Management  Patient type: Established  Location: [facility] (AMB) Pain Management Facility  Visit type: Interventional Procedure   Primary Reason for Visit: Interventional Pain Management Treatment. CC: Back Pain (lower); Foot Pain (bilateral); and Pain (bilateral buttock)  Procedure:          Intrathecal Drug Delivery System (IDDS):  Type: Reservoir Refill [phone]) No rate change Region: Abdominal Laterality: Left  Type of Pump: Medtronic Synchromed II Delivery Route: Intrathecal Type of Pain Treated: Neuropathic/Nociceptive Primary Medication Class: Opioid/opiate  Medication, Concentration, Infusion Program, & Delivery Rate: Please see scanned programming printout.   Indications: 1. Chronic pain syndrome   2. Chronic low back pain (Primary Area of Pain) (Bilateral) (R>L)   3. Lumbar facet syndrome (Bilateral) (R>L)   4. Failed back surgical syndrome   5. Chronic lower extremity pain (Bilateral) (L>R)   6. Neurogenic pain   7. Chronic musculoskeletal pain   8. Muscle spasticity   9. Presence of intrathecal pump   10. Encounter for adjustment or management of infusion pump   11. Gastroesophageal reflux disease without esophagitis   12. Chronic nausea s/p bariatric surgery   13. Therapeutic opioid-induced constipation (OIC)    Pain Assessment: Self-Reported Pain Score: 5 /10             Reported level is compatible with observation.        Pharmacotherapy Assessment  Analgesic: Oxycodone IR 10 mg 1 tablet p.o. every 6 hours (40 mg/day of oxycodone) + 44 mcg/hr intrathecal PF-Fentanyl (1,056.1 mcg/day of Fentanyl) MME/day: 60 mg/day (from oral medication) + 105.6 mg/day (from intrathecal medication) = 165.6 MME/day (Aprox. 0.98 MME/day/kg)    Monitoring: Pharmacotherapy: No side-effects or adverse reactions reported. [place] PMP: PDMP reviewed during this encounter.       Compliance: No problems identified. Plan: Refer to "POC".  Intrathecal Pump Therapy Assessment  Manufacturer: Medtronic Synchromed Type: Programmable Volume: 40 mL reservoir MRI compatibility: Yes   Drug content:  Primary Medication Class: Opioid Primary Medication: PF-Fentanyl (2,000.0 mcg/ml)  Secondary Medication: PF-Bupivacaine (20 mg/mL)  Other Medication: see pump readout   Programming:  Type: Simple continuous. See pump readout for details.   Changes:  Medication Change: None at this point Rate Change: No change in rate  Reported side-effects or adverse reactions: None reported  Effectiveness: Described as relatively effective, allowing for increase in activities of daily living (ADL) Clinically meaningful improvement in function (CMIF): Sustained CMIF goals met  Plan: Pump refill today  Pre-op Assessment:  Mr. Palmeri is a 53 y.o. (year old), male patient, seen today for interventional treatment. He  has a past surgical history that includes Back surgery; morphine pump; Tonsillectomy; Joint replacement; Shoulder arthroscopy with subacromial decompression, rotator cuff repair and bicep tendon repair (Right, 08/18/2013); Shoulder arthroscopy with subacromial decompression and bicep tendon repair (Left, 10/06/2013); and Laparoscopic gastric sleeve resection (N/A, 01/08/2015). Mr. Bunner has a current medication list which includes the following prescription(s): acetaminophen, aspirin, baclofen, bumetanide, escitalopram, esomeprazole, gabapentin enacarbil, gnp milk of magnesia, magnesium oxide, NON FORMULARY, ondansetron, oxycodone hcl, polycarbophil, polyethylene glycol powder, gnp best fiber, and lubiprostone. His primarily concern today is the Back Pain (lower); Foot Pain (bilateral); and Pain (bilateral buttock)  Initial Vital Signs:  Pulse/HCG  Rate: (!) 110  Temp: 98.4 F (36.9  C) Resp: 18 BP: 123/82 SpO2: 98 %  BMI: Estimated body mass index is 53.36 kg/m as calculated from the following:   Height as of this encounter: 5' 10.5" (1.791 m).   Weight as of this encounter: 377 lb 3.2 oz (171.1 kg).  Risk Assessment: Allergies: Reviewed. He is allergic to tape.  Allergy Precautions: None required Coagulopathies: Reviewed. None identified.  Blood-thinner therapy: None at this time Active Infection(s): Reviewed. None identified. Mr. Viana is afebrile  Site Confirmation: Mr. Sookdeo was asked to confirm the procedure and laterality before marking the site Procedure checklist: Completed Consent: Before the procedure and under the influence of no sedative(s), amnesic(s), or anxiolytics, the patient was informed of the treatment options, risks and possible complications. To fulfill our ethical and legal obligations, as recommended by the American Medical Association's Code of Ethics, I have informed the patient of my clinical impression; the nature and purpose of the treatment or procedure; the risks, benefits, and possible complications of the intervention; the alternatives, including doing nothing; the risk(s) and benefit(s) of the alternative treatment(s) or procedure(s); and the risk(s) and benefit(s) of doing nothing.  Mr. Lance was provided with information about the general risks and possible complications associated with most interventional procedures. These include, but are not limited to: failure to achieve desired goals, infection, bleeding, organ or nerve damage, allergic reactions, paralysis, and/or death.  In addition, he was informed of those risks and possible complications associated to this particular procedure, which include, but are not limited to: damage to the implant; failure to decrease pain; local, systemic, or serious CNS infections, intraspinal abscess with possible cord compression and paralysis, or  life-threatening such as meningitis; bleeding; organ damage; nerve injury or damage with subsequent sensory, motor, and/or autonomic system dysfunction, resulting in transient or permanent pain, numbness, and/or weakness of one or several areas of the body; allergic reactions, either minor or major life-threatening, such as anaphylactic or anaphylactoid reactions.  Furthermore, Mr. Katen was informed of those risks and complications associated with the medications. These include, but are not limited to: allergic reactions (i.e.: anaphylactic or anaphylactoid reactions); endorphine suppression; bradycardia and/or hypotension; water retention and/or peripheral vascular relaxation leading to lower extremity edema and possible stasis ulcers; respiratory depression and/or shortness of breath; decreased metabolic rate leading to weight gain; swelling or edema; medication-induced neural toxicity; particulate matter embolism and blood vessel occlusion with resultant organ, and/or nervous system infarction; and/or intrathecal granuloma formation with possible spinal cord compression and permanent paralysis.  Before refilling the pump Mr. Isham was informed that some of the medications used in the devise may not be FDA approved for such use and therefore it constitutes an off-label use of the medications.  Finally, he was informed that Medicine is not an exact science; therefore, there is also the possibility of unforeseen or unpredictable risks and/or possible complications that may result in a catastrophic outcome. The patient indicated having understood very clearly. We have given the patient no guarantees and we have made no promises. Enough time was given to the patient to ask questions, all of which were answered to the patient's satisfaction. Mr. Gatlin has indicated that he wanted to continue with the procedure. Attestation: I, the ordering provider, attest that I have discussed with the patient the  benefits, risks, side-effects, alternatives, likelihood of achieving goals, and potential problems during recovery for the procedure that I have provided informed consent. Date  Time: 07/27/2018 11:23 AM  Pre-Procedure Preparation:  Monitoring: As per clinic protocol. Respiration,  ETCO2, SpO2, BP, heart rate and rhythm monitor placed and checked for adequate function Safety Precautions: Patient was assessed for positional comfort and pressure points before starting the procedure. Time-out: I initiated and conducted the "Time-out" before starting the procedure, as per protocol. The patient was asked to participate by confirming the accuracy of the "Time Out" information. Verification of the correct person, site, and procedure were performed and confirmed by me, the nursing staff, and the patient. "Time-out" conducted as per Joint Commission's Universal Protocol (UP.01.01.01). Time: 1150  Description of Procedure:          Position: Supine Target Area: Central-port of intrathecal pump. Approach: Anterior, 90 degree angle approach. Area Prepped: Entire Area around the pump implant. Prepping solution: ChloraPrep (2% chlorhexidine gluconate and 70% isopropyl alcohol) Safety Precautions: Aspiration looking for blood return was conducted prior to all injections. At no point did we inject any substances, as a needle was being advanced. No attempts were made at seeking any paresthesias. Safe injection practices and needle disposal techniques used. Medications properly checked for expiration dates. SDV (single dose vial) medications used. Description of the Procedure: Protocol guidelines were followed. Two nurses trained to do implant refills were present during the entire procedure. The refill medication was checked by both healthcare providers as well as the patient. The patient was included in the "Time-out" to verify the medication. The patient was placed in position. The pump was identified. The area was  prepped in the usual manner. The sterile template was positioned over the pump, making sure the side-port location matched that of the pump. Both, the pump and the template were held for stability. The needle provided in the Medtronic Kit was then introduced thru the center of the template and into the central port. The pump content was aspirated and discarded volume documented. The new medication was slowly infused into the pump, thru the filter, making sure to avoid overpressure of the device. The needle was then removed and the area cleansed, making sure to leave some of the prepping solution back to take advantage of its long term bactericidal properties. The pump was interrogated and programmed to reflect the correct medication, volume, and dosage. The program was printed and taken to the physician for approval. Once checked and signed by the physician, a copy was provided to the patient and another scanned into the EMR. Vitals:   07/27/18 1123  BP: 123/82  Pulse: (!) 110  Resp: 18  Temp: 98.4 F (36.9 C)  TempSrc: Oral  SpO2: 98%  Weight: (!) 377 lb 3.2 oz (171.1 kg)  Height: 5' 10.5" (1.791 m)    Start Time: 1200 hrs. End Time: 1210 hrs. Materials & Medications: Medtronic Refill Kit Medication(s): Please see chart orders for details.  Imaging Guidance:          Type of Imaging Technique: None used Indication(s): N/A Exposure Time: No patient exposure Contrast: None used. Fluoroscopic Guidance: N/A Ultrasound Guidance: N/A Interpretation: N/A  Antibiotic Prophylaxis:   Anti-infectives (From admission, onward)   None     Indication(s): None identified  Post-operative Assessment:  Post-procedure Vital Signs:  Pulse/HCG Rate: (!) 110  Temp: 98.4 F (36.9 C) Resp: 18 BP: 123/82 SpO2: 98 %  EBL: None  Complications: No immediate post-treatment complications observed by team, or reported by patient.  Note: The patient tolerated the entire procedure well. A repeat set  of vitals were taken after the procedure and the patient was kept under observation following institutional policy, for this type  of procedure. Post-procedural neurological assessment was performed, showing return to baseline, prior to discharge. The patient was provided with post-procedure discharge instructions, including a section on how to identify potential problems. Should any problems arise concerning this procedure, the patient was given instructions to immediately contact us, at any time, without hesitation. In any case, we plan to contact the patient by telephone for a follow-up status report regarding this interventional procedure.  Comments:  No additional relevant information.  Plan of Care  Orders:  Orders Placed This Encounter  Procedures  . LUMBAR FACET(MEDIAL BRANCH NERVE BLOCK) MBNB    Standing Status:   Future    Standing Expiration Date:   08/26/2018    Scheduling Instructions:     Side: Bilateral (NO STEROIDS)     Level: L3-4, L4-5, & L5-S1 Facets (L2, L3, L4, L5, & S1 Medial Branch Nerves)     Sedation: Patient's choice.     Timeframe: ASAA    Order Specific Question:   Where will this procedure be performed?    Answer:   [facility]  . PUMP REFILL    Maintain Protocol by having two(2) healthcare providers during procedure and programming.    Scheduling Instructions:     Please refill intrathecal pump today.    Order Specific Question:   Where will this procedure be performed?    Answer:   [facility]  . PUMP REFILL    Whenever possible schedule on a procedure today.    Standing Status:   Future    Standing Expiration Date:   12/24/2018    Scheduling Instructions:     Please schedule intrathecal pump refill based on pump programming. Avoid schedule intervals of more than 120 days (4 months).    Order Specific Question:   Where will this procedure be performed?    Answer:   [facility]  . Informed Consent Details: Transcribe to consent  form and obtain patient signature    Consent Attestation: I, the ordering provider, attest that I have discussed with the patient the benefits, risks, side-effects, alternatives, likelihood of achieving goals, and potential problems during recovery for the procedure that I have provided informed consent.    Scheduling Instructions:     Procedure: Intrathecal Pump Refill     Attending Physician: Beatriz Chancellor A. Dossie Arbour, MD     Indications: Chronic Pain Syndrome (G89.4)   Medications ordered for procedure: Meds ordered this encounter  Medications  . baclofen (LIORESAL) 10 MG tablet    Sig: Take 1 tablet (10 mg total) by mouth 4 (four) times daily.    Dispense:  120 tablet    Refill:  1    Do not place medication on "Automatic Refill". Fill one day early if pharmacy is closed on scheduled refill date.  . ondansetron (ZOFRAN-ODT) 8 MG disintegrating tablet    Sig: Take 1 tablet (8 mg total) by mouth every 8 (eight) hours as needed for nausea or vomiting.    Dispense:  180 tablet    Refill:  0    Do not place medication on "Automatic Refill". Fill one day early if pharmacy is closed on scheduled refill date.  . esomeprazole (NEXIUM) 40 MG capsule    Sig: Take 1 capsule (40 mg total) by mouth daily.    Dispense:  30 capsule    Refill:  1    Do not place medication on "Automatic Refill". Fill one day early if pharmacy is closed on scheduled refill date.  Marland Kitchen  Oxycodone HCl 10 MG TABS    Sig: Take 1 tablet (10 mg total) by mouth every 6 (six) hours as needed for up to 30 days. Must last 30 days. Max: 3/day.    Dispense:  90 tablet    Refill:  0    [place] STOP ACT - Not applicable to Chronic Pain Syndrome (G89.4) diagnosis. Fill one day early if pharmacy is closed on scheduled refill date. Do not fill until: 08/29/18. To last until: 09/28/18.  . Magnesium Oxide 500 MG CAPS    Sig: Take 1 capsule (500 mg total) by mouth 2 (two) times daily at 8 am and 10 pm.    Dispense:  60 capsule    Refill:  1     Do not place medication on "Automatic Refill". Fill one day early if pharmacy is closed on scheduled refill date.  . Gabapentin Enacarbil (HORIZANT) 600 MG TBCR    Sig: Take 1 tablet (600 mg total) by mouth 2 (two) times daily.    Dispense:  120 tablet    Refill:  0    Do not place medication on "Automatic Refill". Fill one day early if pharmacy is closed on scheduled refill date.  . lubiprostone (AMITIZA) 24 MCG capsule    Sig: Take 1 capsule (24 mcg total) by mouth 2 (two) times daily with a meal. Swallow the medication whole. Do not break or chew the medication.    Dispense:  60 capsule    Refill:  1    Do not place this medication, or any other prescription from our practice, on "Automatic Refill". Patient may have prescription filled one day early if pharmacy is closed on scheduled refill date.   Medications administered: Jeneen Rinks B. Kagawa had no medications administered during this visit.  See the medical record for exact dosing, route, and time of administration.  Disposition: Discharge home  Discharge Date & Time: 07/27/2018; 1222 hrs.   Follow-up plan:   Return for Pump Refill (as per pump program) (Max:57mo, Procedure (w/ sedation): (B) L-FCT BLK #2 (NO STEROIDS).  Refills were provided to the patient for 60 days.   Interventional management options: Considering:   Diagnostic bilateral lumbar facet block #2 Palliative/therapeuticintrathecal pump refill and maintenance Diagnostic left Genicular nerve block Possible left Genicular nerve RFA Possible bilateral lumbar facet RFA(Once his BMI is <35) (Currently 53.3) Diagnostic caudal epidural steroid injection+epidurogram Possible Racz procedure Diagnostic bilateral intra-articular shoulder joint injection Diagnostic bilateral suprascapular nerve block Possible bilateral suprascapular nerve RFA Diagnostic interlaminar ESI versus transforaminal ESI   Palliative PRN treatment(s):    Palliative/therapeuticintrathecal pump refill and maintenance Diagnostic bilateral lumbar facet blocks   Future Appointments  Date Time Provider [place] 08/04/2018 11:00 AM AElouise Munroe MD [facility] 09/16/2018 11:00 AM [facility]   Primary Care Physician: SAntony Contras MD [facility] Note by: FGaspar Cola MD Date: 07/27/2018; Time: 12:24 PM  Disclaimer:  Medicine is not an exact science. The only guarantee in medicine is that nothing is guaranteed. It is important to note that the decision to proceed with this intervention was based on the information collected from the patient. The Data and conclusions were drawn from the patient's questionnaire, the interview, and the physical examination. Because the information was provided in large part by the patient, it cannot be guaranteed that it has not been purposely or unconsciously manipulated. Every effort has been made to obtain as much relevant data as possible  for this evaluation. It is important to note that the conclusions that lead to this procedure are derived in large part from the available data. Always take into account that the treatment will also be dependent on availability of resources and existing treatment guidelines, considered by other Pain Management Practitioners as being common knowledge and practice, at the time of the intervention. For Medico-Legal purposes, it is also important to point out that variation in procedural techniques and pharmacological choices are the acceptable norm. The indications, contraindications, technique, and results of the above procedure should only be interpreted and judged by a Board-Certified Interventional Pain Specialist with extensive familiarity and expertise in the same exact procedure and technique.

## 2018-07-27 NOTE — Progress Notes (Signed)
Nursing Pain Medication Assessment:  Safety precautions to be maintained throughout the outpatient stay will include: orient to surroundings, keep bed in low position, maintain call bell within reach at all times, provide assistance with transfer out of bed and ambulation.  Medication Inspection Compliance: Pill count conducted under aseptic conditions, in front of the patient. Neither the pills nor the bottle was removed from the patient's sight at any time. Once count was completed pills were immediately returned to the patient in their original bottle.  Medication: Oxycodone IR Pill/Patch Count: 33.5 of 120 pills remain Pill/Patch Appearance: Markings consistent with prescribed medication Bottle Appearance: Standard pharmacy container. Clearly labeled. Filled Date: 03 / 21 / 2020 Last Medication intake:  Today

## 2018-07-27 NOTE — Patient Instructions (Addendum)
____________________________________________________________________________________________  Preparing for Procedure with Sedation  Procedure appointments are limited to planned procedures: . No Prescription Refills. . No disability issues will be discussed. . No medication changes will be discussed.  Instructions: . Oral Intake: Do not eat or drink anything for at least 8 hours prior to your procedure. . Transportation: Public transportation is not allowed. Bring an adult driver. The driver must be physically present in our waiting room before any procedure can be started. . Physical Assistance: Bring an adult physically capable of assisting you, in the event you need help. This adult should keep you company at home for at least 6 hours after the procedure. . Blood Pressure Medicine: Take your blood pressure medicine with a sip of water the morning of the procedure. . Blood thinners: Notify our staff if you are taking any blood thinners. Depending on which one you take, there will be specific instructions on how and when to stop it. . Diabetics on insulin: Notify the staff so that you can be scheduled 1st case in the morning. If your diabetes requires high dose insulin, take only  of your normal insulin dose the morning of the procedure and notify the staff that you have done so. . Preventing infections: Shower with an antibacterial soap the morning of your procedure. . Build-up your immune system: Take 1000 mg of Vitamin C with every meal (3 times a day) the day prior to your procedure. . Antibiotics: Inform the staff if you have a condition or reason that requires you to take antibiotics before dental procedures. . Pregnancy: If you are pregnant, call and cancel the procedure. . Sickness: If you have a cold, fever, or any active infections, call and cancel the procedure. . Arrival: You must be in the facility at least 30 minutes prior to your scheduled procedure. . Children: Do not bring  children with you. . Dress appropriately: Bring dark clothing that you would not mind if they get stained. . Valuables: Do not bring any jewelry or valuables.  Reasons to call and reschedule or cancel your procedure: (Following these recommendations will minimize the risk of a serious complication.) . Surgeries: Avoid having procedures within 2 weeks of any surgery. (Avoid for 2 weeks before or after any surgery). . Flu Shots: Avoid having procedures within 2 weeks of a flu shots or . (Avoid for 2 weeks before or after immunizations). . Barium: Avoid having a procedure within 7-10 days after having had a radiological study involving the use of radiological contrast. (Myelograms, Barium swallow or enema study). . Heart attacks: Avoid any elective procedures or surgeries for the initial 6 months after a "Myocardial Infarction" (Heart Attack). . Blood thinners: It is imperative that you stop these medications before procedures. Let us know if you if you take any blood thinner.  . Infection: Avoid procedures during or within two weeks of an infection (including chest colds or gastrointestinal problems). Symptoms associated with infections include: Localized redness, fever, chills, night sweats or profuse sweating, burning sensation when voiding, cough, congestion, stuffiness, runny nose, sore throat, diarrhea, nausea, vomiting, cold or Flu symptoms, recent or current infections. It is specially important if the infection is over the area that we intend to treat. . Heart and lung problems: Symptoms that may suggest an active cardiopulmonary problem include: cough, chest pain, breathing difficulties or shortness of breath, dizziness, ankle swelling, uncontrolled high or unusually low blood pressure, and/or palpitations. If you are experiencing any of these symptoms, cancel your procedure and contact   your primary care physician for an evaluation.  Remember:  Regular Business hours are:  Monday to Thursday  8:00 AM to 4:00 PM  Provider's Schedule: [name], MD:  Procedure days: Tuesday and Thursday 7:30 AM to 4:00 PM  Edward Jolly, MD:  Procedure days: Monday and Wednesday 7:30 AM to 4:00 PM ____________________________________________________________________________________________    ______________________________________________________________________________________________  Weight Management Required  URGENT: Your weight has been found to be adversely affecting your health.  Dear [name]:  Your current There is no height or weight on file to calculate BMI.. Estimated body mass index is 52.09 kg/m as calculated from the following:   Height as of 07/11/18: 5\' 11"  (1.803 m).   Weight as of 07/15/18: 373 lb 7.4 oz (169.4 kg).  Your last four (4) weight and BMI calculations are as follows: Wt Readings from Last 4 Encounters:  07/27/18 (!) 377 lb 3.2 oz (171.1 kg)  07/15/18 (!) 373 lb 7.4 oz (169.4 kg)  06/30/18 (!) 378 lb (171.5 kg)  06/14/18 (!) 381 lb 6.3 oz (173 kg)   BMI Readings from Last 4 Encounters:  07/15/18 52.09 kg/m  06/30/18 53.47 kg/m  06/14/18 53.95 kg/m  06/08/18 54.63 kg/m    Calculations estimate your ideal body weight to be: Ideal body weight: 75.3 kg (166 lb 0.1 oz) Adjusted ideal body weight: 112.9 kg (248 lb 15.8 oz)  Please use the table below to identify your weight category and associated incidence of chronic pain, secondary to your weight.  BMI interpretation table: BMI level Category Associated incidence of chronic pain  <18 kg/m2 Underweight   18.5-24.9 kg/m2 Ideal body weight   25-29.9 kg/m2 Overweight  20%  30-34.9 kg/m2 Obese (Class I)  68%  35-39.9 kg/m2 Severe obesity (Class II)  136%  >40 kg/m2 Extreme obesity (Class III)  254%   In addition: You will be considered "Morbidly Obese", if your BMI is above 30 and you have one or more of the following conditions which are known to be directly associated with obesity: 1.     Type 2 Diabetes (Which in turn can lead to cardiovascular diseases (CVD), stroke, peripheral vascular diseases (PVD), retinopathy, nephropathy, and neuropathy) 2.    Cardiovascular Disease (High Blood Pressure; Congestive Heart Failure; High Cholesterol; Coronary Artery Disease; Angina; or History of Heart Attacks) 3.    Breathing problems (Asthma; obesity-hypoventilation syndrome; obstructive sleep apnea; chronic inflammatory airway disease; reactive airway disease; or shortness of breath) 4.    Chronic kidney disease 5.    Liver disease (nonalcoholic fatty liver disease) 6.    High blood pressure 7.    Acid reflux (gastroesophageal reflux disease; heartburn) 8.    Osteoarthritis (OA) (with any of the following: hip pain; knee pain; and/or low back pain) 9.    Low back pain (Lumbar Facet Syndrome; and/or Degenerative Disc Disease) 10.  Hip pain (Osteoarthritis of hip) (For every 1 lbs of added body weight, there is a 2 lbs increase in pressure inside of each hip articulation. 1:2 mechanical relationship) 11.  Knee pain (Osteoarthritis of knee) (For every 1 lbs of added body weight, there is a 4 lbs increase in pressure inside of each knee articulation. 1:4 mechanical relationship) (patients with a BMI>30 kg/m2 were 6.8 times more likely to develop knee OA than normal-weight individuals) 12.  Cancer. Epidemiological studies have shown that obesity is a risk factor for: post-menopausal breast cancer; cancers of the endometrium, colon and kidney cancer; malignant adenomas of the oesophagus. Obese subjects have an  approximately 1.5-3.5-fold increased risk of developing these cancers compared with normal-weight subjects, and it has been estimated that between 15 and 45% of these cancers can be attributed to overweight. More recent studies suggest that obesity may also increase the risk of other types of cancer, including pancreatic, hepatic and gallbladder cancer. (Ref: Obesity and cancer. Pischon T,  Nthlings U, [employer] H. Proc Nutr Soc. 2008 May;67(2):128-45. doi: 10.1017/S0029665108006976.)  Recommendation: At this point it is urgent that you take a step back and concentrate in loosing weight. Dedicate 100% of your efforts on this task. Nothing else will improve your health more than bringing down your BMI to less than 30. Because most chronic pain patients do have difficulty exercising secondary to their pain, you must rely on proper nutrition and dieting in order to lose the weight. If your BMI is above 40, you should seriously consider bariatric surgery. A realistic goal is to lose 10% of your body weight over a period of 12 months.  If over time you have unsuccessfully try to lose weight, then it is time for you to seek professional help and to enter a medically supervised weight management program.  Pain management considerations:  1.    Pharmacological Problems: Be advised that the use of opioid analgesics (oxycodone; hydrocodone; morphine; methadone; codeine; and all of their derivatives) have been associated with decreased metabolism and weight gain.  For this reason, should we see that you are unable to lose weight while taking these medications, it may become necessary for us to taper down and indefinitely discontinue them.  2.    Technical Problems: The incidence of successful interventional therapies decreases as the patient's BMI increases. It is much more difficult to accomplish a safe and effective interventional therapy on a patient with a BMI above 35. Yours is Body mass index is 53.36 kg/m.Marland Kitchen.  3.    Radiation Exposure Problems: The x-rays machine, used to accomplish injection therapies, will automatically increase their x-ray output in order to capture an appropriate bone image. This means that radiation exposure increases exponentially with the patient's BMI. (The higher the BMI, the higher the radiation exposure.) Although the level of radiation used at a given time is still safe to  the patient, it is not for the physician and/or assisting staff. Unfortunately, radiation exposure is accumulative. Because physicians and the staff have to do procedures and be exposed on a daily basis, this can result in health problems such as cancer and radiation burns. Radiation exposure to the staff is monitored by the radiation batches that they wear. The exposure levels are reported back to the staff on a quarterly basis. Depending on levels of exposure, physicians and staff may be obligated by law to decrease this exposure. This means that they have the right and obligation to refuse providing therapies where they may be overexposed to radiation. For this reason, physicians may decline to offer therapies such as radiofrequency ablation or implants to patients with a BMI above 40. 4.    Current Trends: Be advised that the current trend is to no longer offer certain therapies to patients with a BMI equal to, or above 35, due to increase perioperative risks, increased technical procedural difficulties, and excessive radiation exposure to healthcare personnel.  ______________________________________________________________________________________________

## 2018-08-03 ENCOUNTER — Telehealth: Payer: Self-pay | Admitting: Internal Medicine

## 2018-08-03 NOTE — Telephone Encounter (Signed)
Smartphone/ my chart via text/ virtual consent /pre reg completed °

## 2018-08-04 ENCOUNTER — Encounter: Payer: Self-pay | Admitting: Internal Medicine

## 2018-08-04 ENCOUNTER — Telehealth: Payer: Self-pay

## 2018-08-04 ENCOUNTER — Telehealth (INDEPENDENT_AMBULATORY_CARE_PROVIDER_SITE_OTHER): Payer: Medicare Other | Admitting: Internal Medicine

## 2018-08-04 ENCOUNTER — Ambulatory Visit: Payer: Medicare Other | Admitting: Internal Medicine

## 2018-08-04 VITALS — BP 122/70 | HR 88 | Ht 70.5 in | Wt 370.0 lb

## 2018-08-04 DIAGNOSIS — I5032 Chronic diastolic (congestive) heart failure: Secondary | ICD-10-CM | POA: Diagnosis not present

## 2018-08-04 DIAGNOSIS — G4733 Obstructive sleep apnea (adult) (pediatric): Secondary | ICD-10-CM

## 2018-08-04 NOTE — Telephone Encounter (Signed)
Virtual Visit Pre-Appointment Phone Call  "(Name), I am calling you today to discuss your upcoming appointment. We are currently trying to limit exposure to the virus that causes COVID-19 by seeing patients at home rather than in the office."  1. "What is the BEST phone number to call the day of the visit?" - include this in appointment notes  2. "Do you have or have access to (through a family member/friend) a smartphone with video capability that we can use for your visit?" a. If yes - list this number in appt notes as "cell" (if different from BEST phone #) and list the appointment type as a VIDEO visit in appointment notes b. If no - list the appointment type as a PHONE visit in appointment notes  3. Confirm consent - "In the setting of the current Covid19 crisis, you are scheduled for a VIDEO visit with Dr. Rudene AndaArcharya on 08/04/2018 at 11:00AM.  Just as we do with many in-office visits, in order for you to participate in this visit, we must obtain consent.  If you'd like, I can send this to your mychart (if signed up) or email for you to review.  Otherwise, I can obtain your verbal consent now.  All virtual visits are billed to your insurance company just like a normal visit would be.  By agreeing to a virtual visit, we'd like you to understand that the technology does not allow for your provider to perform an examination, and thus may limit your provider's ability to fully assess your condition. If your provider identifies any concerns that need to be evaluated in person, we will make arrangements to do so.  Finally, though the technology is pretty good, we cannot assure that it will always work on either your or our end, and in the setting of a video visit, we may have to convert it to a phone-only visit.  In either situation, we cannot ensure that we have a secure connection.  Are you willing to proceed?" STAFF: Did the patient verbally acknowledge consent to telehealth visit? Document YES/NO  here: YES  4. Advise patient to be prepared - "Two hours prior to your appointment, go ahead and check your blood pressure, pulse, oxygen saturation, and your weight (if you have the equipment to check those) and write them all down. When your visit starts, your provider will ask you for this information. If you have an Apple Watch or Kardia device, please plan to have heart rate information ready on the day of your appointment. Please have a pen and paper handy nearby the day of the visit as well."  5. Give patient instructions for MyChart download to smartphone OR Doximity/Doxy.me as below if video visit (depending on what platform provider is using)  6. Inform patient they will receive a phone call 15 minutes prior to their appointment time (may be from unknown caller ID) so they should be prepared to answer    TELEPHONE CALL NOTE  [facility] Labs[facility] B Fergeson has been deemed a candidate for a follow-up tele-health visit to limit community exposure during the Covid-19 pandemic. I spoke with the patient via phone to ensure availability of phone/video source, confirm preferred email & phone number, and discuss instructions and expectations.  I reminded [facility] B Finder to be prepared with any vital sign and/or heart rhythm information that could potentially be obtained via home monitoring, at the time of his visit. I reminded [facility] Labs[facility] B Scrogham to expect a phone call prior to his visit.  Dorris Fetch, CMA 08/04/2018 11:03 AM   INSTRUCTIONS FOR DOWNLOADING THE MYCHART APP TO SMARTPHONE  - The patient must first make sure to have activated MyChart and know their login information - If Apple, go to [employer] and type in MyChart in the search bar and download the app. If Android, ask patient to go to [employer] and type in [place] in the search bar and download the app. The app is free but as with any other app downloads, their phone may require them to verify saved payment information or Apple/Android  password.  - The patient will need to then log into the app with their MyChart username and password, and select [facility] as their healthcare provider to link the account. When it is time for your visit, go to the MyChart app, find appointments, and click Begin Video Visit. Be sure to Select Allow for your device to access the Microphone and Camera for your visit. You will then be connected, and your provider will be with you shortly.  **If they have any issues connecting, or need assistance please contact MyChart service desk [phone])**  **If using a computer, in order to ensure the best quality for their visit they will need to use either of the following Internet Browsers: [employer] or Google Chrome**  IF USING DOXIMITY or DOXY.ME - The patient will receive a link just prior to their visit by text.     FULL LENGTH CONSENT FOR TELE-HEALTH VISIT   I hereby voluntarily request, consent and authorize [facility] and its employed or contracted physicians, physician assistants, nurse practitioners or other licensed health care professionals (the Practitioner), to provide me with telemedicine health care services (the "Services") as deemed necessary by the treating Practitioner. I acknowledge and consent to receive the Services by the Practitioner via telemedicine. I understand that the telemedicine visit will involve communicating with the Practitioner through live audiovisual communication technology and the disclosure of certain medical information by electronic transmission. I acknowledge that I have been given the opportunity to request an in-person assessment or other available alternative prior to the telemedicine visit and am voluntarily participating in the telemedicine visit.  I understand that I have the right to withhold or withdraw my consent to the use of telemedicine in the course of my care at any time, without affecting my right to future care or treatment,  and that the Practitioner or I may terminate the telemedicine visit at any time. I understand that I have the right to inspect all information obtained and/or recorded in the course of the telemedicine visit and may receive copies of available information for a reasonable fee.  I understand that some of the potential risks of receiving the Services via telemedicine include:  Marland Kitchen Delay or interruption in medical evaluation due to technological equipment failure or disruption; . Information transmitted may not be sufficient (e.g. poor resolution of images) to allow for appropriate medical decision making by the Practitioner; and/or  . In rare instances, security protocols could fail, causing a breach of personal health information.  Furthermore, I acknowledge that it is my responsibility to provide information about my medical history, conditions and care that is complete and accurate to the best of my ability. I acknowledge that Practitioner's advice, recommendations, and/or decision may be based on factors not within their control, such as incomplete or inaccurate data provided by me or distortions of diagnostic images or specimens that may result from electronic transmissions. I understand that the  practice of medicine is not an Chief Strategy Officer and that Practitioner makes no warranties or guarantees regarding treatment outcomes. I acknowledge that I will receive a copy of this consent concurrently upon execution via email to the email address I last provided but may also request a printed copy by calling the office of [facility].    I understand that my insurance will be billed for this visit.   I have read or had this consent read to me. . I understand the contents of this consent, which adequately explains the benefits and risks of the Services being provided via telemedicine.  . I have been provided ample opportunity to ask questions regarding this consent and the Services and have had my questions  answered to my satisfaction. . I give my informed consent for the services to be provided through the use of telemedicine in my medical care  By participating in this telemedicine visit I agree to the above.

## 2018-08-04 NOTE — Patient Instructions (Signed)
Medication Instructions:   Your physician recommends that you continue on your current medications as directed. Please refer to the Current Medication list given to you today.  If you need a refill on your cardiac medications before your next appointment, please call your pharmacy.   Lab work: You will need to come into our office in 1-2 weeks for labs (blood work): BMET  If you have labs (blood work) drawn today and your tests are completely normal, you will receive your results only by: Marland Kitchen MyChart Message (if you have MyChart) OR . A paper copy in the mail If you have any lab test that is abnormal or we need to change your treatment, we will call you to review the results.  Testing/Procedures:  NONE ordered at this time of appointment   Follow-Up: At [facility], you and your health needs are our priority.  As part of our continuing mission to provide you with exceptional heart care, we have created designated Provider Care Teams.  These Care Teams include your primary Cardiologist (physician) and Advanced Practice Providers (APPs -  Physician Assistants and Nurse Practitioners) who all work together to provide you with the care you need, when you need it. You will need a follow up appointment in 6 weeks with Parke Poisson, MD   Any Other Special Instructions Will Be Listed Below (If Applicable).

## 2018-08-04 NOTE — Progress Notes (Signed)
Virtual Visit via Video Note   This visit type was conducted due to national recommendations for restrictions regarding the COVID-19 Pandemic (e.g. social distancing) in an effort to limit this patient's exposure and mitigate transmission in our community.  Due to his co-morbid illnesses, this patient is at least at moderate risk for complications without adequate follow up.  This format is felt to be most appropriate for this patient at this time.  All issues noted in this document were discussed and addressed.  A limited physical exam was performed with this format.  Please refer to the patient's chart for his consent to telehealth for [facility].   Evaluation Performed:  Follow-up visit  Date:  08/04/2018   ID:  [name], DOB 23-May-1965, MRN [id number]  Patient Location: Home Provider Location: Home  PCP:  [name], MD  Cardiologist:  Parke Poisson, MD  Electrophysiologist:  None   Chief Complaint:  Continued sensation of volume overload  History of Present Illness:    [name] is a 53 y.o. male with  a hx of OSA with previous CPAP prescription, not currently using routinely,  heart failure with preserved ejection fraction, back injury with chronic back pain and pain control infusion pump, GERD, mild asthma, who presents for follow up of volume overload. He feels he is still 50 lbs overweight. He was recently admitted for AMS and recurrent falls. He was noted to have an elevated ammonia level, with resolution of symptoms with administration of lactulose. He endorses alcohol use to potentiate the effects of his pain control medications and to treat unresolved pain. We discussed alcohol use and management of substance abuse. We also discussed the role of increased volume intake and alcohol use in heart failure.   He feels he has stopped responding to diuresis, but has no worsening of heart failure symptoms. He feels his weights are the primary concern. He has had  this increase in weight since November 2019, and has had an interval hospitalization for heart failure with significant diuresis. He feels he still has an elevated weight despite diuresis. We discussed possible causes of weight gain and strategies for fluid mobilization and adipose loss.  The patient denies chest pain, chest pressure, dyspnea at rest or with exertion, palpitations, PND, orthopnea. Continues to have leg swelling. Denies syncope or presyncope. Denies dizziness or lightheadedness.   The patient does not have symptoms concerning for COVID-19 infection (fever, chills, cough, or new shortness of breath).    Past Medical History:  Diagnosis Date  . Arthritis   . Asthma    as child  . Back pain   . CHF (congestive heart failure) (HCC)   . GERD (gastroesophageal reflux disease)   . Hernia   . History of hiatal hernia   . Hypogonadism in male   . Neuromuscular disorder (HCC)    back injury with surgery  . PONV (postoperative nausea and vomiting)    gets nausea  . Shortness of breath dyspnea   . Sleep apnea    Does not use CPAP, new equipment 12/2014  . Ulcer   . Wears glasses    Past Surgical History:  Procedure Laterality Date  . BACK SURGERY    . JOINT REPLACEMENT     Left knee replacement  . LAPAROSCOPIC GASTRIC SLEEVE RESECTION N/A 01/08/2015   Procedure: LAPAROSCOPIC GASTRIC SLEEVE RESECTION;  Surgeon: Luretha Murphy, MD;  Location: [facility];  Service: General;  Laterality: N/A;  . morphine pump    .  SHOULDER ARTHROSCOPY WITH SUBACROMIAL DECOMPRESSION AND BICEP TENDON REPAIR Left 10/06/2013   Procedure: LEFT SHOULDER ARTHROSCOPY DEBRIDEMENT EXTENTSIVE,DISTAL CLAVICULECTOMY,DECOMPRESSION SUBACROMIAL PARTIAL ACROMIOPLASTY WITH ROTATOR CUFF REPAIR, and excision of CALCIUM DEPOSIT;  Surgeon: Loreta Ave, MD;  Location: [facility];  Service: Orthopedics;  Laterality: Left;  . SHOULDER ARTHROSCOPY WITH SUBACROMIAL DECOMPRESSION, ROTATOR CUFF REPAIR AND BICEP  TENDON REPAIR Right 08/18/2013   Procedure: RIGHT SHOULDER ARTHROSCOPY WITH SUBACROMIAL DECOMPRESSION/PARTIAL ACROMIOPLASTY WITH CORACOACROMIAL RELEASE/DISTAL CLAVICULECTOMY/ ROTATOR CUFF REPAIR/DEBRIDEMENT EXTENTSIVE;  Surgeon: Loreta Ave, MD;  Location: [facility];  Service: Orthopedics;  Laterality: Right;  ANESTHESIA: GENERAL, PRE/POST OP SCALENE  . TONSILLECTOMY       Current Meds  Medication Sig  . acetaminophen (TYLENOL) 500 MG tablet Take 1,000 mg by mouth every 6 (six) hours as needed for moderate pain.  Marland Kitchen aspirin EC 81 MG EC tablet Take 1 tablet (81 mg total) by mouth daily.  . baclofen (LIORESAL) 10 MG tablet Take 1 tablet (10 mg total) by mouth 4 (four) times daily.  . bumetanide (BUMEX) 1 MG tablet Take 1 tablet (1 mg total) by mouth 2 (two) times daily for 30 days.  Marland Kitchen escitalopram (LEXAPRO) 10 MG tablet Take 10 mg by mouth daily.  Marland Kitchen esomeprazole (NEXIUM) 40 MG capsule Take 1 capsule (40 mg total) by mouth daily.  . Gabapentin Enacarbil (HORIZANT) 600 MG TBCR Take 1 tablet (600 mg total) by mouth 2 (two) times daily.  . GNP MILK OF MAGNESIA 1200 MG/15ML suspension Take 5 mLs by mouth daily as needed for mild constipation.   Marland Kitchen lubiprostone (AMITIZA) 24 MCG capsule Take 1 capsule (24 mcg total) by mouth 2 (two) times daily with a meal. Swallow the medication whole. Do not break or chew the medication.  . Magnesium Oxide 500 MG CAPS Take 1 capsule (500 mg total) by mouth 2 (two) times daily at 8 am and 10 pm.  . NON FORMULARY 959 mcg by Intrathecal route daily. IT pump Fentanyl 2,000.0 mcg/ml Bupivicaine 20.0 mg/ml 40 ml pump  . Oxycodone HCl 10 MG TABS Take 1 tablet (10 mg total) by mouth every 6 (six) hours as needed for up to 30 days. Must last 30 days. Max: 3/day.  . polyethylene glycol powder (GLYCOLAX/MIRALAX) 17 GM/SCOOP powder TAKE ONE capful mixed WITH 8 oz OF fluid AS NEEDED  . Wheat Dextrin (GNP BEST FIBER) POWD Take 6 g by mouth 3 (three) times daily  before meals. (2 tsp = 6 g). Stir into 8 oz of water, or sprinkle over food.     Allergies:   Tape   Social History   Tobacco Use  . Smoking status: Never Smoker  . Smokeless tobacco: Current User    Types: Snuff  Substance Use Topics  . Alcohol use: Yes    Comment: 2-7 beers per day, starting in Oct 2019.  . Drug use: No     Family Hx: The patient's family history includes Diabetes in his mother; Pneumonia in his father.  ROS:   Please see the history of present illness.     All other systems reviewed and are negative.   Prior CV studies:   The following studies were reviewed today:    Labs/Other Tests and Data Reviewed:    EKG:  No ECG reviewed.  Recent Labs: 07/11/2018: ALT 24; B Natriuretic Peptide 348.1; Magnesium 2.1; TSH 0.938 07/15/2018: BUN 14; Creatinine, Ser 0.78; Hemoglobin 14.9; Platelets 220; Potassium 4.5; Sodium 136   Recent Lipid Panel  Lab Results  Component Value Date/Time   TRIG 76 06/11/2018 05:39 AM    Wt Readings from Last 3 Encounters:  08/04/18 (!) 370 lb (167.8 kg)  07/27/18 (!) 377 lb 3.2 oz (171.1 kg)  07/15/18 (!) 373 lb 7.4 oz (169.4 kg)     Objective:    Vital Signs:  BP 122/70   Pulse 88   Ht 5' 10.5" (1.791 m)   Wt (!) 370 lb (167.8 kg)   BMI 52.34 kg/m    VITAL SIGNS:  reviewed GEN:  no acute distress EYES:  sclerae anicteric, EOMI - Extraocular Movements Intact RESPIRATORY:  normal respiratory effort, symmetric expansion CARDIOVASCULAR:  Bilateral peripheral edema SKIN:  no rash, lesions or ulcers. MUSCULOSKELETAL:  no obvious deformities. NEURO:  alert and oriented x 3, no obvious focal deficit PSYCH:  normal affect  ASSESSMENT & PLAN:    1. Chronic diastolic congestive heart failure (HCC)   2. OSA complicated by mild polycythemia    3. Obesity, morbid (more than 100 lbs over ideal weight or BMI > 40) (HCC)    We discussed continued use of diuretics and compliance with HF diet, as well as avoidance of alcohol  use or salt intake. I have recommended BMET to evaluate renal function to better understand why he feels he has stopped responding to diuresis.   Recommended continued evaluation for OSA and CPAP, updating his appliance so that he can attempt compliance.   We discussed activity recommendations, he is significantly limited by his chronic pain.   We will follow up labs and closely follow his symptoms.   COVID-19 Education: The signs and symptoms of COVID-19 were discussed with the patient and how to seek care for testing (follow up with PCP or arrange E-visit).  The importance of social distancing was discussed today.  Time:   Today, I have spent 25 minutes with the patient with telehealth technology discussing the above problems.     Medication Adjustments/Labs and Tests Ordered: Current medicines are reviewed at length with the patient today.  Concerns regarding medicines are outlined above.   Tests Ordered: Orders Placed This Encounter  Procedures  . Basic metabolic panel    Medication Changes: No orders of the defined types were placed in this encounter.   Disposition:  Follow up in 6 week(s)  Signed, Parke PoissonGayatri A , MD  08/04/2018 11:03 PM    [facility]

## 2018-08-05 NOTE — Telephone Encounter (Signed)
Encounter opened by mistake. No interaction. Delete.

## 2018-08-25 ENCOUNTER — Encounter: Payer: Self-pay | Admitting: Pain Medicine

## 2018-08-26 ENCOUNTER — Other Ambulatory Visit: Payer: Self-pay | Admitting: Pain Medicine

## 2018-08-26 DIAGNOSIS — K219 Gastro-esophageal reflux disease without esophagitis: Secondary | ICD-10-CM

## 2018-08-26 DIAGNOSIS — K5903 Drug induced constipation: Secondary | ICD-10-CM

## 2018-09-01 ENCOUNTER — Other Ambulatory Visit: Payer: Self-pay

## 2018-09-01 MED ORDER — PAIN MANAGEMENT IT PUMP REFILL: 1.0000 | Freq: Once | INTRATHECAL | 0 refills | Status: AC

## 2018-09-10 ENCOUNTER — Telehealth: Payer: Self-pay

## 2018-09-10 NOTE — Telephone Encounter (Signed)
Left message to call back  

## 2018-09-13 ENCOUNTER — Telehealth: Payer: Self-pay | Admitting: Pain Medicine

## 2018-09-13 ENCOUNTER — Other Ambulatory Visit: Payer: Self-pay

## 2018-09-13 DIAGNOSIS — Z01818 Encounter for other preprocedural examination: Secondary | ICD-10-CM | POA: Insufficient documentation

## 2018-09-13 NOTE — Telephone Encounter (Signed)
Patient notified that he needed to get Covid 19 test done prior to coming in for pump refill.  Patient given locations and states he will go tho the [facility] for testing.  Dr [name] to put order in.

## 2018-09-16 ENCOUNTER — Encounter: Payer: Self-pay | Admitting: Pain Medicine

## 2018-09-16 ENCOUNTER — Other Ambulatory Visit: Payer: Self-pay

## 2018-09-16 ENCOUNTER — Ambulatory Visit (HOSPITAL_BASED_OUTPATIENT_CLINIC_OR_DEPARTMENT_OTHER): Payer: Medicare Other | Admitting: Pain Medicine

## 2018-09-16 ENCOUNTER — Other Ambulatory Visit
Admission: RE | Admit: 2018-09-16 | Discharge: 2018-09-16 | Disposition: A | Discharge status: Home / Self Care | Payer: Medicare Other | Source: Ambulatory Visit | Attending: Pain Medicine | Admitting: Pain Medicine

## 2018-09-16 VITALS — BP 121/87 | HR 100 | Temp 98.7°F | Resp 16 | Ht 71.0 in | Wt 375.0 lb

## 2018-09-16 DIAGNOSIS — G8929 Other chronic pain: Secondary | ICD-10-CM

## 2018-09-16 DIAGNOSIS — M961 Postlaminectomy syndrome, not elsewhere classified: Secondary | ICD-10-CM | POA: Insufficient documentation

## 2018-09-16 DIAGNOSIS — Z451 Encounter for adjustment and management of infusion pump: Secondary | ICD-10-CM | POA: Diagnosis not present

## 2018-09-16 DIAGNOSIS — M25569 Pain in unspecified knee: Secondary | ICD-10-CM | POA: Insufficient documentation

## 2018-09-16 DIAGNOSIS — K219 Gastro-esophageal reflux disease without esophagitis: Secondary | ICD-10-CM | POA: Diagnosis not present

## 2018-09-16 DIAGNOSIS — R110 Nausea: Secondary | ICD-10-CM

## 2018-09-16 DIAGNOSIS — G894 Chronic pain syndrome: Secondary | ICD-10-CM | POA: Insufficient documentation

## 2018-09-16 DIAGNOSIS — M62838 Other muscle spasm: Secondary | ICD-10-CM | POA: Diagnosis not present

## 2018-09-16 DIAGNOSIS — Z1159 Encounter for screening for other viral diseases: Secondary | ICD-10-CM | POA: Insufficient documentation

## 2018-09-16 DIAGNOSIS — Z96652 Presence of left artificial knee joint: Secondary | ICD-10-CM | POA: Insufficient documentation

## 2018-09-16 DIAGNOSIS — Z978 Presence of other specified devices: Secondary | ICD-10-CM

## 2018-09-16 DIAGNOSIS — M25562 Pain in left knee: Secondary | ICD-10-CM | POA: Insufficient documentation

## 2018-09-16 DIAGNOSIS — M545 Low back pain, unspecified: Secondary | ICD-10-CM

## 2018-09-16 DIAGNOSIS — M51379 Other intervertebral disc degeneration, lumbosacral region without mention of lumbar back pain or lower extremity pain: Secondary | ICD-10-CM

## 2018-09-16 DIAGNOSIS — T402X5A Adverse effect of other opioids, initial encounter: Secondary | ICD-10-CM

## 2018-09-16 DIAGNOSIS — M5134 Other intervertebral disc degeneration, thoracic region: Secondary | ICD-10-CM

## 2018-09-16 DIAGNOSIS — M5137 Other intervertebral disc degeneration, lumbosacral region: Secondary | ICD-10-CM | POA: Insufficient documentation

## 2018-09-16 DIAGNOSIS — M549 Dorsalgia, unspecified: Secondary | ICD-10-CM | POA: Diagnosis not present

## 2018-09-16 DIAGNOSIS — Z6841 Body Mass Index (BMI) 40.0 and over, adult: Secondary | ICD-10-CM | POA: Insufficient documentation

## 2018-09-16 DIAGNOSIS — K5903 Drug induced constipation: Secondary | ICD-10-CM

## 2018-09-16 DIAGNOSIS — M792 Neuralgia and neuritis, unspecified: Secondary | ICD-10-CM

## 2018-09-16 DIAGNOSIS — Z01818 Encounter for other preprocedural examination: Secondary | ICD-10-CM | POA: Diagnosis not present

## 2018-09-16 DIAGNOSIS — M7918 Myalgia, other site: Secondary | ICD-10-CM

## 2018-09-16 LAB — SARS CORONAVIRUS 2 BY RT PCR (HOSPITAL ORDER, PERFORMED IN ~~LOC~~ HOSPITAL LAB): SARS Coronavirus 2: NEGATIVE

## 2018-09-16 MED ORDER — ONDANSETRON 8 MG PO TBDP: 8.0000 mg | ORAL_TABLET | Freq: Three times a day (TID) | ORAL | 0 refills | Status: DC | PRN

## 2018-09-16 MED ORDER — GNP BEST FIBER PO POWD: ORAL | 2 refills | Status: DC

## 2018-09-16 MED ORDER — LUBIPROSTONE 24 MCG PO CAPS: 24.0000 ug | ORAL_CAPSULE | Freq: Two times a day (BID) | ORAL | 2 refills | Status: DC

## 2018-09-16 MED ORDER — POLYETHYLENE GLYCOL 3350 17 GM/SCOOP PO POWD: ORAL | 2 refills | Status: DC

## 2018-09-16 MED ORDER — BACLOFEN 10 MG PO TABS: 10.0000 mg | ORAL_TABLET | Freq: Four times a day (QID) | ORAL | 2 refills | Status: DC

## 2018-09-16 MED ORDER — OXYCODONE HCL 10 MG PO TABS: 10.0000 mg | ORAL_TABLET | Freq: Four times a day (QID) | ORAL | 0 refills | Status: DC | PRN

## 2018-09-16 MED ORDER — MAGNESIUM OXIDE -MG SUPPLEMENT 500 MG PO CAPS: 1.0000 | ORAL_CAPSULE | Freq: Two times a day (BID) | ORAL | 0 refills | Status: DC

## 2018-09-16 MED ORDER — ESOMEPRAZOLE MAGNESIUM 40 MG PO CPDR: 40.0000 mg | DELAYED_RELEASE_CAPSULE | Freq: Every day | ORAL | 0 refills | Status: DC

## 2018-09-16 MED ORDER — GABAPENTIN ENACARBIL ER 600 MG PO TBCR: 600.0000 mg | EXTENDED_RELEASE_TABLET | Freq: Two times a day (BID) | ORAL | 0 refills | Status: DC

## 2018-09-16 NOTE — Progress Notes (Signed)
Patient's Name: [name]  MRN: [id number]  Referring Provider: Antony Contras, MD  DOB: 1965/10/02  PCP: Antony Contras, MD  DOS: 09/16/2018  Note by: Gaspar Cola, MD  Service setting: Ambulatory outpatient  Specialty: Interventional Pain Management  Patient type: Established  Location: [facility] (AMB) Pain Management Facility  Visit type: Interventional Procedure   Primary Reason for Visit: Interventional Pain Management Treatment. CC: Back Pain (lower)  Procedure:          Intrathecal Drug Delivery System (IDDS):  Type: Reservoir Refill [phone]) No rate change Region: Abdominal Laterality: Left  Type of Pump: Medtronic Synchromed II Delivery Route: Intrathecal Type of Pain Treated: Neuropathic/Nociceptive Primary Medication Class: Opioid/opiate  Medication, Concentration, Infusion Program, & Delivery Rate: Please see scanned programming printout.   Indications: 1. Chronic pain syndrome   2. DDD (degenerative disc disease), lumbosacral   3. DDD (degenerative disc disease), thoracic   4. Failed back surgical syndrome   5. Chronic low back pain (Primary Area of Pain) (Bilateral) (R>L)   6. 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 Presence of intrathecal pump   8. Encounter for adjustment or management of infusion pump    Pain Assessment: Self-Reported Pain Score: 5 /10             Reported level is compatible with observation.        Pharmacotherapy Assessment  Analgesic: Oxycodone IR 10 mg 1 tablet p.o. every 6 hours (40 mg/day of oxycodone) + 44 mcg/hr intrathecal PF-Fentanyl (1,056.1 mcg/day of Fentanyl) MME/day:31m/day(from oral medication) + 105.6 mg/day (from intrathecal medication) = 165.6 MME/day (Aprox. 0.98 MME/day/kg)    Monitoring: Pharmacotherapy: No side-effects or adverse reactions reported. [place] PMP: PDMP reviewed during this encounter.       Compliance: No problems identified. Effectiveness: Clinically acceptable. Plan: Refer to  "POC".  Intrathecal Pump Therapy Assessment  Manufacturer: Medtronic Synchromed Type: Programmable Volume: 40 mL reservoir MRI compatibility: Yes   Drug content:  Primary Medication Class:Opioid Primary Medication:PF-Fentanyl(2,000.0 mcg/ml) Secondary Medication:PF-Bupivacaine(274mmL) Other Medication:see pump readout   Programming:  Type: Simple continuous. See pump readout for details.   Changes:  Medication Change: None at this point Rate Change: No change in rate  Reported side-effects or adverse reactions: None reported  Effectiveness: Described as relatively effective, allowing for increase in activities of daily living (ADL) Clinically meaningful improvement in function (CMIF): Sustained CMIF goals met  Plan: Pump refill today  Pre-op Assessment:  [name] a [age].o. (year old), male patient, seen today for interventional treatment. He  has a past surgical history that includes Back surgery; morphine pump; Tonsillectomy; Joint replacement; Shoulder arthroscopy with subacromial decompression, rotator cuff repair and bicep tendon repair (Right, 08/18/2013); Shoulder arthroscopy with subacromial decompression and bicep tendon repair (Left, 10/06/2013); and Laparoscopic gastric sleeve resection (N/A, 01/08/2015). [name] a current medication list which includes the following prescription(s): acetaminophen, aspirin, baclofen, escitalopram, esomeprazole, gabapentin enacarbil, gnp milk of magnesia, lubiprostone, magnesium oxide, NON FORMULARY, ondansetron, oxycodone hcl, oxycodone hcl, oxycodone hcl, polycarbophil, polyethylene glycol powder, gnp best fiber, and bumetanide. His primarily concern today is the Back Pain (lower)  Initial Vital Signs:  Pulse/HCG Rate: 100  Temp: 98.7 F (37.1 C) Resp: 16 BP: 121/87 SpO2:    BMI: Estimated body mass index is 52.3 kg/m as calculated from the following:   Height as of this encounter: _0  (1.803 m).   Weight as  of this encounter: 375 lb (170.1 kg).  Risk Assessment: Allergies: Reviewed.  He is allergic to tape.  Allergy Precautions: None required Coagulopathies: Reviewed. None identified.  Blood-thinner therapy: None at this time Active Infection(s): Reviewed. None identified. [name] is afebrile  Site Confirmation: [name] was asked to confirm the procedure and laterality before marking the site Procedure checklist: Completed Consent: Before the procedure and under the influence of no sedative(s), amnesic(s), or anxiolytics, the patient was informed of the treatment options, risks and possible complications. To fulfill our ethical and legal obligations, as recommended by the American Medical Association's Code of Ethics, I have informed the patient of my clinical impression; the nature and purpose of the treatment or procedure; the risks, benefits, and possible complications of the intervention; the alternatives, including doing nothing; the risk(s) and benefit(s) of the alternative treatment(s) or procedure(s); and the risk(s) and benefit(s) of doing nothing.  [name] was provided with information about the general risks and possible complications associated with most interventional procedures. These include, but are not limited to: failure to achieve desired goals, infection, bleeding, organ or nerve damage, allergic reactions, paralysis, and/or death.  In addition, he was informed of those risks and possible complications associated to this particular procedure, which include, but are not limited to: damage to the implant; failure to decrease pain; local, systemic, or serious CNS infections, intraspinal abscess with possible cord compression and paralysis, or life-threatening such as meningitis; bleeding; organ damage; nerve injury or damage with subsequent sensory, motor, and/or autonomic system dysfunction, resulting in transient or permanent pain, numbness, and/or weakness of one or several  areas of the body; allergic reactions, either minor or major life-threatening, such as anaphylactic or anaphylactoid reactions.  Furthermore, [name] was informed of those risks and complications associated with the medications. These include, but are not limited to: allergic reactions (i.e.: anaphylactic or anaphylactoid reactions); endorphine suppression; bradycardia and/or hypotension; water retention and/or peripheral vascular relaxation leading to lower extremity edema and possible stasis ulcers; respiratory depression and/or shortness of breath; decreased metabolic rate leading to weight gain; swelling or edema; medication-induced neural toxicity; particulate matter embolism and blood vessel occlusion with resultant organ, and/or nervous system infarction; and/or intrathecal granuloma formation with possible spinal cord compression and permanent paralysis.  Before refilling the pump Mr. Grim was informed that some of the medications used in the devise may not be FDA approved for such use and therefore it constitutes an off-label use of the medications.  Finally, he was informed that Medicine is not an exact science; therefore, there is also the possibility of unforeseen or unpredictable risks and/or possible complications that may result in a catastrophic outcome. The patient indicated having understood very clearly. We have given the patient no guarantees and we have made no promises. Enough time was given to the patient to ask questions, all of which were answered to the patient's satisfaction. Mr. Tallman has indicated that he wanted to continue with the procedure. Attestation: I, the ordering provider, attest that I have discussed with the patient the benefits, risks, side-effects, alternatives, likelihood of achieving goals, and potential problems during recovery for the procedure that I have provided informed consent. Date  Time: 09/16/2018 11:34 AM  Pre-Procedure Preparation:   Monitoring: As per clinic protocol. Respiration, ETCO2, SpO2, BP, heart rate and rhythm monitor placed and checked for adequate function Safety Precautions: Patient was assessed for positional comfort and pressure points before starting the procedure. Time-out: I initiated and conducted the "Time-out" before starting the procedure, as per protocol. The patient was asked to participate by confirming  the accuracy of the "Time Out" information. Verification of the correct person, site, and procedure were performed and confirmed by me, the nursing staff, and the patient. "Time-out" conducted as per Joint Commission's Universal Protocol (UP.01.01.01). Time: 1137  Description of Procedure:          Position: Supine Target Area: Central-port of intrathecal pump. Approach: Anterior, 90 degree angle approach. Area Prepped: Entire Area around the pump implant. Prepping solution: 6M DuraPrep (Iodine Povacrylex [0.7% available iodine] and Isopropyl Alcohol, 74% w/w) Safety Precautions: Aspiration looking for blood return was conducted prior to all injections. At no point did we inject any substances, as a needle was being advanced. No attempts were made at seeking any paresthesias. Safe injection practices and needle disposal techniques used. Medications properly checked for expiration dates. SDV (single dose vial) medications used. Description of the Procedure: Protocol guidelines were followed. Two nurses trained to do implant refills were present during the entire procedure. The refill medication was checked by both healthcare providers as well as the patient. The patient was included in the "Time-out" to verify the medication. The patient was placed in position. The pump was identified. The area was prepped in the usual manner. The sterile template was positioned over the pump, making sure the side-port location matched that of the pump. Both, the pump and the template were held for stability. The needle  provided in the Medtronic Kit was then introduced thru the center of the template and into the central port. The pump content was aspirated and discarded volume documented. The new medication was slowly infused into the pump, thru the filter, making sure to avoid overpressure of the device. The needle was then removed and the area cleansed, making sure to leave some of the prepping solution back to take advantage of its long term bactericidal properties. The pump was interrogated and programmed to reflect the correct medication, volume, and dosage. The program was printed and taken to the physician for approval. Once checked and signed by the physician, a copy was provided to the patient and another scanned into the EMR. Vitals:   09/16/18 1133  BP: 121/87  Pulse: 100  Resp: 16  Temp: 98.7 F (37.1 C)  Weight: (!) 375 lb (170.1 kg)  Height: _0  (1.803 m)    Start Time: 1137 hrs. End Time: 1149 hrs. Materials & Medications: Medtronic Refill Kit Medication(s): Please see chart orders for details.  Imaging Guidance:          Type of Imaging Technique: None used Indication(s): N/A Exposure Time: No patient exposure Contrast: None used. Fluoroscopic Guidance: N/A Ultrasound Guidance: N/A Interpretation: N/A  Antibiotic Prophylaxis:   Anti-infectives (From admission, onward)   None     Indication(s): None identified  Post-operative Assessment:  Post-procedure Vital Signs:  Pulse/HCG Rate: 100  Temp: 98.7 F (37.1 C) Resp: 16 BP: 121/87 SpO2:    EBL: None  Complications: No immediate post-treatment complications observed by team, or reported by patient.  Note: The patient tolerated the entire procedure well. A repeat set of vitals were taken after the procedure and the patient was kept under observation following institutional policy, for this type of procedure. Post-procedural neurological assessment was performed, showing return to baseline, prior to discharge. The patient  was provided with post-procedure discharge instructions, including a section on how to identify potential problems. Should any problems arise concerning this procedure, the patient was given instructions to immediately contact us, at any time, without hesitation. In any case, we plan  to contact the patient by telephone for a follow-up status report regarding this interventional procedure.  Comments:  No additional relevant information.  Plan of Care  Orders:  Orders Placed This Encounter  Procedures  . PUMP REFILL    Maintain Protocol by having two(2) healthcare providers during procedure and programming.    Scheduling Instructions:     Please refill intrathecal pump today.    Order Specific Question:   Where will this procedure be performed?    Answer:   [facility]  . PUMP REFILL    Whenever possible schedule on a procedure today.    Standing Status:   Future    Standing Expiration Date:   02/13/2019    Scheduling Instructions:     Please schedule intrathecal pump refill based on pump programming. Avoid schedule intervals of more than 120 days (4 months).    Order Specific Question:   Where will this procedure be performed?    Answer:   [facility]  . Informed Consent Details: Transcribe to consent form and obtain patient signature    Consent Attestation: I, the ordering provider, attest that I have discussed with the patient the benefits, risks, side-effects, alternatives, likelihood of achieving goals, and potential problems during recovery for the procedure that I have provided informed consent.    Scheduling Instructions:     Procedure: Intrathecal Pump Refill     Attending Physician: Beatriz Chancellor A. Dossie Arbour, MD     Indications: Chronic Pain Syndrome (G89.4)   Medications ordered for procedure: Meds ordered this encounter  Medications  . baclofen (LIORESAL) 10 MG tablet    Sig: Take 1 tablet (10 mg total) by mouth 4 (four) times daily. Must last 30 days     Dispense:  120 tablet    Refill:  2    Fill one day early if pharmacy is closed on scheduled refill date. May substitute for generic if available.  . ondansetron (ZOFRAN-ODT) 8 MG disintegrating tablet    Sig: Take 1 tablet (8 mg total) by mouth every 8 (eight) hours as needed for nausea or vomiting.    Dispense:  270 tablet    Refill:  0    Fill one day early if pharmacy is closed on scheduled refill date. May substitute for generic if available.  . esomeprazole (NEXIUM) 40 MG capsule    Sig: Take 1 capsule (40 mg total) by mouth daily.    Dispense:  90 capsule    Refill:  0    Do not place medication on "Automatic Refill". Fill one day early if pharmacy is closed on scheduled refill date.  . Magnesium Oxide 500 MG CAPS    Sig: Take 1 capsule (500 mg total) by mouth 2 (two) times daily at 8 am and 10 pm.    Dispense:  180 capsule    Refill:  0    Fill one day early if pharmacy is closed on scheduled refill date. May substitute for generic if available.  . Gabapentin Enacarbil (HORIZANT) 600 MG TBCR    Sig: Take 1 tablet (600 mg total) by mouth 2 (two) times daily.    Dispense:  180 tablet    Refill:  0    Fill one day early if pharmacy is closed on scheduled refill date. May substitute for generic if available.  . lubiprostone (AMITIZA) 24 MCG capsule    Sig: Take 1 capsule (24 mcg total) by mouth 2 (two) times daily with a meal. Swallow the medication  whole. Do not break or chew the medication.    Dispense:  60 capsule    Refill:  2    Fill one day early if pharmacy is closed on scheduled refill date. May substitute for generic if available.  . polyethylene glycol powder (GLYCOLAX/MIRALAX) 17 GM/SCOOP powder    Sig: TAKE ONE capful mixed WITH 8 oz OF fluid AS NEEDED    Dispense:  255 g    Refill:  2    Fill one day early if pharmacy is closed on scheduled refill date. May substitute for generic if available.  . Wheat Dextrin (GNP BEST FIBER) POWD    Sig: Take 6 g by mouth 3  (three) times daily before meals. (2 tsp = 6 g). Stir into 8 oz of water, or sprinkle over food.    Dispense:  244 g    Refill:  2    Fill one day early if pharmacy is closed on scheduled refill date. May substitute for generic if available.  . Oxycodone HCl 10 MG TABS    Sig: Take 1 tablet (10 mg total) by mouth every 6 (six) hours as needed for up to 30 days. Must last 30 days. Max: 3/day.    Dispense:  120 tablet    Refill:  0    Chronic Pain: STOP Act - Not applicable. Fill 1 day early if closed on scheduled refill date. Do not fill until: 09/23/2018. To last until: 10/23/2018. Instruct to avoid benzodiazepines within 8 hours of opioid.  . Oxycodone HCl 10 MG TABS    Sig: Take 1 tablet (10 mg total) by mouth every 6 (six) hours as needed for up to 30 days. Must last 30 days. Max: 3/day.    Dispense:  120 tablet    Refill:  0    Chronic Pain: STOP Act - Not applicable. Fill 1 day early if closed on scheduled refill date. Do not fill until: 10/23/2018. To last until: 11/22/2018. Instruct to avoid benzodiazepines within 8 hours of opioid.  . Oxycodone HCl 10 MG TABS    Sig: Take 1 tablet (10 mg total) by mouth every 6 (six) hours as needed for up to 30 days. Must last 30 days. Max: 3/day.    Dispense:  120 tablet    Refill:  0    Chronic Pain: STOP Act - Not applicable. Fill 1 day early if closed on scheduled refill date. Do not fill until: 11/22/2018. To last until: 12/22/2018. Instruct to avoid benzodiazepines within 8 hours of opioid.   Medications administered: Jeneen Rinks B. [place] had no medications administered during this visit.  See the medical record for exact dosing, route, and time of administration.  Disposition: Discharge home  Discharge Date & Time: 09/16/2018; 1200 hrs.   Follow-up plan:   Return for Pump Refill (Max:81mo, in addition, (3 mo) Med-Mgmt, (Virtual Visit).     Future Appointments  Date Time Provider DDouds 10/22/2018  1:20 PM AElouise Munroe MD  [facility] 11/09/2018 11:00 AM NMilinda Pointer MD [facility] None  12/13/2018  8:30 AM [facility]   Primary Care Physician: SAntony Contras MD [facility] Note by: FGaspar Cola MD Date: 09/16/2018; Time: 1:06 PM  Disclaimer:  Medicine is not an eChief Strategy Officer The only guarantee in medicine is that nothing is guaranteed. It is important to note that the decision to proceed with this intervention was based on the information collected from the patient.  The Data and conclusions were drawn from the patient's questionnaire, the interview, and the physical examination. Because the information was provided in large part by the patient, it cannot be guaranteed that it has not been purposely or unconsciously manipulated. Every effort has been made to obtain as much relevant data as possible for this evaluation. It is important to note that the conclusions that lead to this procedure are derived in large part from the available data. Always take into account that the treatment will also be dependent on availability of resources and existing treatment guidelines, considered by other Pain Management Practitioners as being common knowledge and practice, at the time of the intervention. For Medico-Legal purposes, it is also important to point out that variation in procedural techniques and pharmacological choices are the acceptable norm. The indications, contraindications, technique, and results of the above procedure should only be interpreted and judged by a Board-Certified Interventional Pain Specialist with extensive familiarity and expertise in the same exact procedure and technique.

## 2018-09-16 NOTE — Patient Instructions (Signed)
____________________________________________________________________________________________  Medication Rules  Purpose: To inform patients, and their family members, of our rules and regulations.  Applies to: All patients receiving prescriptions (written or electronic).  Pharmacy of record: Pharmacy where electronic prescriptions will be sent. If written prescriptions are taken to a different pharmacy, please inform the nursing staff. The pharmacy listed in the electronic medical record should be the one where you would like electronic prescriptions to be sent.  Electronic prescriptions: In compliance with the [place] Strengthen Opioid Misuse Prevention (STOP) Act of 2017 (Session Law 2017-74/H243), effective April 14, 2018, all controlled substances must be electronically prescribed. Calling prescriptions to the pharmacy will cease to exist.  Prescription refills: Only during scheduled appointments. Applies to all prescriptions.  NOTE: The following applies primarily to controlled substances (Opioid* Pain Medications).   Patient's responsibilities: 1. Pain Pills: Bring all pain pills to every appointment (except for procedure appointments). 2. Pill Bottles: Bring pills in original pharmacy bottle. Always bring the newest bottle. Bring bottle, even if empty. 3. Medication refills: You are responsible for knowing and keeping track of what medications you take and those you need refilled. The day before your appointment: write a list of all prescriptions that need to be refilled. The day of the appointment: give the list to the admitting nurse. Prescriptions will be written only during appointments. No prescriptions will be written on procedure days. If you forget a medication: it will not be "Called in", "Faxed", or "electronically sent". You will need to get another appointment to get these prescribed. No early refills. Do not call asking to have your prescription filled  early. 4. Prescription Accuracy: You are responsible for carefully inspecting your prescriptions before leaving our office. Have the discharge nurse carefully go over each prescription with you, before taking them home. Make sure that your name is accurately spelled, that your address is correct. Check the name and dose of your medication to make sure it is accurate. Check the number of pills, and the written instructions to make sure they are clear and accurate. Make sure that you are given enough medication to last until your next medication refill appointment. 5. Taking Medication: Take medication as prescribed. When it comes to controlled substances, taking less pills or less frequently than prescribed is permitted and encouraged. Never take more pills than instructed. Never take medication more frequently than prescribed.  6. Inform other Doctors: Always inform, all of your healthcare providers, of all the medications you take. 7. Pain Medication from other Providers: You are not allowed to accept any additional pain medication from any other Doctor or Healthcare provider. There are two exceptions to this rule. (see below) In the event that you require additional pain medication, you are responsible for notifying us, as stated below. 8. Medication Agreement: You are responsible for carefully reading and following our Medication Agreement. This must be signed before receiving any prescriptions from our practice. Safely store a copy of your signed Agreement. Violations to the Agreement will result in no further prescriptions. (Additional copies of our Medication Agreement are available upon request.) 9. Laws, Rules, & Regulations: All patients are expected to follow all Federal and State Laws, Statutes, Rules, & Regulations. Ignorance of the Laws does not constitute a valid excuse. The use of any illegal substances is prohibited. 10. Adopted CDC guidelines & recommendations: Target dosing levels will be  at or below 60 MME/day. Use of benzodiazepines** is not recommended.  Exceptions: There are only two exceptions to the rule of not   receiving pain medications from other Healthcare Providers. 1. Exception #1 (Emergencies): In the event of an emergency (i.e.: accident requiring emergency care), you are allowed to receive additional pain medication. However, you are responsible for: As soon as you are able, call our office [phone], at any time of the day or night, and leave a message stating your name, the date and nature of the emergency, and the name and dose of the medication prescribed. In the event that your call is answered by a member of our staff, make sure to document and save the date, time, and the name of the person that took your information.  2. Exception #2 (Planned Surgery): In the event that you are scheduled by another doctor or dentist to have any type of surgery or procedure, you are allowed (for a period no longer than 30 days), to receive additional pain medication, for the acute post-op pain. However, in this case, you are responsible for picking up a copy of our "Post-op Pain Management for Surgeons" handout, and giving it to your surgeon or dentist. This document is available at our office, and does not require an appointment to obtain it. Simply go to our office during business hours (Monday-Thursday from 8:00 AM to 4:00 PM) (Friday 8:00 AM to 12:00 Noon) or if you have a scheduled appointment with us, prior to your surgery, and ask for it by name. In addition, you will need to provide us with your name, name of your surgeon, type of surgery, and date of procedure or surgery.  *Opioid medications include: morphine, codeine, oxycodone, oxymorphone, hydrocodone, hydromorphone, meperidine, tramadol, tapentadol, buprenorphine, fentanyl, methadone. **Benzodiazepine medications include: diazepam (Valium), alprazolam (Xanax), clonazepam (Klonopine), lorazepam (Ativan), clorazepate  (Tranxene), chlordiazepoxide (Librium), estazolam (Prosom), oxazepam (Serax), temazepam (Restoril), triazolam (Halcion) (Last updated: 06/11/2017) ____________________________________________________________________________________________   ____________________________________________________________________________________________  Medication Recommendations and Reminders  Applies to: All patients receiving prescriptions (written and/or electronic).  Medication Rules & Regulations: These rules and regulations exist for your safety and that of others. They are not flexible and neither are we. Dismissing or ignoring them will be considered "non-compliance" with medication therapy, resulting in complete and irreversible termination of such therapy. (See document titled "Medication Rules" for more details.) In all conscience, because of safety reasons, we cannot continue providing a therapy where the patient does not follow instructions.  Pharmacy of record:   Definition: This is the pharmacy where your electronic prescriptions will be sent.   We do not endorse any particular pharmacy.  You are not restricted in your choice of pharmacy.  The pharmacy listed in the electronic medical record should be the one where you want electronic prescriptions to be sent.  If you choose to change pharmacy, simply notify our nursing staff of your choice of new pharmacy.  Recommendations:  Keep all of your pain medications in a safe place, under lock and key, even if you live alone.   After you fill your prescription, take 1 week's worth of pills and put them away in a safe place. You should keep a separate, properly labeled bottle for this purpose. The remainder should be kept in the original bottle. Use this as your primary supply, until it runs out. Once it's gone, then you know that you have 1 week's worth of medicine, and it is time to come in for a prescription refill. If you do this correctly, it  is unlikely that you will ever run out of medicine.  To make sure that the above recommendation works,   it is very important that you make sure your medication refill appointments are scheduled at least 1 week before you run out of medicine. To do this in an effective manner, make sure that you do not leave the office without scheduling your next medication management appointment. Always ask the nursing staff to show you in your prescription , when your medication will be running out. Then arrange for the receptionist to get you a return appointment, at least 7 days before you run out of medicine. Do not wait until you have 1 or 2 pills left, to come in. This is very poor planning and does not take into consideration that we may need to cancel appointments due to bad weather, sickness, or emergencies affecting our staff.  "Partial Fill": If for any reason your pharmacy does not have enough pills/tablets to completely fill or refill your prescription, do not allow for a "partial fill". You will need a separate prescription to fill the remaining amount, which we will not provide. If the reason for the partial fill is your insurance, you will need to talk to the pharmacist about payment alternatives for the remaining tablets, but again, do not accept a partial fill.  Prescription refills and/or changes in medication(s):   Prescription refills, and/or changes in dose or medication, will be conducted only during scheduled medication management appointments. (Applies to both, written and electronic prescriptions.)  No refills on procedure days. No medication will be changed or started on procedure days. No changes, adjustments, and/or refills will be conducted on a procedure day. Doing so will interfere with the diagnostic portion of the procedure.  No phone refills. No medications will be "called into the pharmacy".  No Fax refills.  No weekend refills.  No Holliday refills.  No after hours  refills.  Remember:  Business hours are:  Monday to Thursday 8:00 AM to 4:00 PM Provider's Schedule: Crystal King, NP - Appointments are:  Medication management: Monday to Thursday 8:00 AM to 4:00 PM Lori Liew, MD - Appointments are:  Medication management: Monday and Wednesday 8:00 AM to 4:00 PM Procedure day: Tuesday and Thursday 7:30 AM to 4:00 PM Bilal Lateef, MD - Appointments are:  Medication management: Tuesday and Thursday 8:00 AM to 4:00 PM Procedure day: Monday and Wednesday 7:30 AM to 4:00 PM (Last update: 06/11/2017) ____________________________________________________________________________________________   ____________________________________________________________________________________________  CANNABIDIOL (AKA: CBD Oil or Pills)  Applies to: All patients receiving prescriptions of controlled substances (written and/or electronic).  General Information: Cannabidiol (CBD) was discovered in 1940. It is one of some 113 identified cannabinoids in cannabis (Marijuana) plants, accounting for up to 40% of the plant's extract. As of 2018, preliminary clinical research on cannabidiol included studies of anxiety, cognition, movement disorders, and pain.  Cannabidiol is consummed in multiple ways, including inhalation of cannabis smoke or vapor, as an aerosol spray into the cheek, and by mouth. It may be supplied as CBD oil containing CBD as the active ingredient (no added tetrahydrocannabinol (THC) or terpenes), a full-plant CBD-dominant hemp extract oil, capsules, dried cannabis, or as a liquid solution. CBD is thought not have the same psychoactivity as THC, and may affect the actions of THC. Studies suggest that CBD may interact with different biological targets, including cannabinoid receptors and other neurotransmitter receptors. As of 2018 the mechanism of action for its biological effects has not been determined.  In the [place], cannabidiol has a limited  approval by the Food and Drug Administration (FDA) for treatment of only two types   of epilepsy disorders. The side effects of long-term use of the drug include somnolence, decreased appetite, diarrhea, fatigue, malaise, weakness, sleeping problems, and others.  CBD remains a Schedule I drug prohibited for any use.  Legality: Some manufacturers ship CBD products nationally, an illegal action which the FDA has not enforced in 2018, with CBD remaining the subject of an FDA investigational new drug evaluation, and is not considered legal as a dietary supplement or food ingredient as of December 2018. Federal illegality has made it difficult historically to conduct research on CBD. CBD is openly sold in head shops and health food stores in some states where such sales have not been explicitly legalized.  Warning: Because it is not FDA approved for general use or treatment of pain, it is not required to undergo the same manufacturing controls as prescription drugs.  This means that the available cannabidiol (CBD) may be contaminated with THC.  If this is the case, it will trigger a positive urine drug screen (UDS) test for cannabinoids (Marijuana).  Because a positive UDS for illicit substances is a violation of our medication agreement, your opioid analgesics (pain medicine) may be permanently discontinued. (Last update: 07/02/2017) ____________________________________________________________________________________________    

## 2018-09-16 NOTE — Progress Notes (Signed)
Safety precautions to be maintained throughout the outpatient stay will include: orient to surroundings, keep bed in low position, maintain call bell within reach at all times, provide assistance with transfer out of bed and ambulation.  

## 2018-09-17 ENCOUNTER — Telehealth: Payer: Self-pay | Admitting: *Deleted

## 2018-09-17 NOTE — Telephone Encounter (Signed)
No problems post pump fill. 

## 2018-09-20 ENCOUNTER — Telehealth: Payer: Medicare Other | Admitting: Internal Medicine

## 2018-09-21 MED FILL — Medication: INTRATHECAL | Qty: 1 | Status: AC

## 2018-09-22 ENCOUNTER — Other Ambulatory Visit: Payer: Self-pay | Admitting: Pain Medicine

## 2018-09-22 ENCOUNTER — Telehealth: Payer: Self-pay | Admitting: *Deleted

## 2018-09-22 ENCOUNTER — Telehealth: Payer: Self-pay

## 2018-09-22 DIAGNOSIS — G894 Chronic pain syndrome: Secondary | ICD-10-CM

## 2018-09-22 MED ORDER — OXYCODONE HCL 10 MG PO TABS: 10.0000 mg | ORAL_TABLET | Freq: Four times a day (QID) | ORAL | 0 refills | Status: DC | PRN

## 2018-09-22 NOTE — Telephone Encounter (Signed)
Spoke with April at [facility], the problem with Rx is qty vs tablets/day.  I have spoken with Dr Dossie Arbour and he is going to change the Rx to reflect qty and sig.  I have called back to April and cancelled the Rx's that were there and Dr Dossie Arbour will send in new Rx's.

## 2018-09-22 NOTE — Telephone Encounter (Signed)
He is having an issue with getting his medications in the dosage that Dr. Dossie Arbour ordered him to take them. Please call.

## 2018-10-13 ENCOUNTER — Other Ambulatory Visit: Payer: Self-pay

## 2018-10-13 MED ORDER — PAIN MANAGEMENT IT PUMP REFILL: 1.0000 | Freq: Once | INTRATHECAL | 0 refills | Status: AC

## 2018-10-14 ENCOUNTER — Other Ambulatory Visit: Payer: Self-pay

## 2018-10-14 MED ORDER — PAIN MANAGEMENT IT PUMP REFILL: 1.0000 | Freq: Once | INTRATHECAL | 0 refills | Status: AC

## 2018-10-18 ENCOUNTER — Telehealth: Payer: Self-pay | Admitting: Pain Medicine

## 2018-10-18 NOTE — Telephone Encounter (Signed)
Pt called and stated that he is having muscle spasms and wanted to know if Dr Delane Ginger could change his muscle relaxer.

## 2018-10-18 NOTE — Telephone Encounter (Signed)
Please schedule a virtual appointment with Dr Dossie Arbour to discuss medication changes.

## 2018-10-21 ENCOUNTER — Telehealth: Payer: Self-pay | Admitting: Internal Medicine

## 2018-10-21 NOTE — Telephone Encounter (Signed)
I called pt to confirm his appt on 10-22-18 with Dr Margaretann Loveless.

## 2018-10-22 ENCOUNTER — Telehealth (INDEPENDENT_AMBULATORY_CARE_PROVIDER_SITE_OTHER): Payer: Medicare Other | Admitting: Internal Medicine

## 2018-10-22 ENCOUNTER — Encounter: Payer: Self-pay | Admitting: Internal Medicine

## 2018-10-22 VITALS — Ht 71.0 in | Wt 377.0 lb

## 2018-10-22 DIAGNOSIS — Z79899 Other long term (current) drug therapy: Secondary | ICD-10-CM

## 2018-10-22 DIAGNOSIS — I5032 Chronic diastolic (congestive) heart failure: Secondary | ICD-10-CM

## 2018-10-22 DIAGNOSIS — R0609 Other forms of dyspnea: Secondary | ICD-10-CM

## 2018-10-22 MED ORDER — BUMETANIDE 1 MG PO TABS: ORAL_TABLET | ORAL | 6 refills | Status: DC

## 2018-10-22 NOTE — Progress Notes (Signed)
Virtual Visit via Video Note   This visit type was conducted due to national recommendations for restrictions regarding the COVID-19 Pandemic (e.g. social distancing) in an effort to limit this patient's exposure and mitigate transmission in our community.  Due to his co-morbid illnesses, this patient is at least at moderate risk for complications without adequate follow up.  This format is felt to be most appropriate for this patient at this time.  All issues noted in this document were discussed and addressed.  A limited physical exam was performed with this format.  Please refer to the patient's chart for his consent to telehealth [facility].   Date:  10/22/2018   ID:  [name], DOB 1966-01-26, MRN [id number]  Patient Location: Home Provider Location: Office  PCP:  Tally JoeSwayne, David, MD  Cardiologist:  Parke PoissonGayatri A Brookelin Felber, MD  Electrophysiologist:  None   Evaluation Performed:  Follow-Up Visit  Chief Complaint:  Volume overload, HFpEF  History of Present Illness:    [name] is a 53 y.o. male well known to my clinic.   Still feeling as if he has increased volume, struggling to have diuresis. Feels bumex works for 1-2 hrs then stops. Takes 1 mg BID. Feels he has some continued difficulty with breathing and edema.   Continues to have chronic severe pain.  The patient denies chest pain, chest pressure, dyspnea at rest, palpitations, PND, orthopnea. Denies syncope or presyncope. Denies dizziness or lightheadedness.  The patient does not have symptoms concerning for COVID-19 infection (fever, chills, cough, or new shortness of breath).    Past Medical History:  Diagnosis Date  . Arthritis   . Asthma    as child  . Back pain   . CHF (congestive heart failure) (HCC)   . GERD (gastroesophageal reflux disease)   . Hernia   . History of hiatal hernia   . Hypogonadism in male   . Neuromuscular disorder (HCC)    back injury with surgery  . PONV (postoperative  nausea and vomiting)    gets nausea  . Shortness of breath dyspnea   . Sleep apnea    Does not use CPAP, new equipment 12/2014  . Ulcer   . Wears glasses    Past Surgical History:  Procedure Laterality Date  . BACK SURGERY    . JOINT REPLACEMENT     Left knee replacement  . LAPAROSCOPIC GASTRIC SLEEVE RESECTION N/A 01/08/2015   Procedure: LAPAROSCOPIC GASTRIC SLEEVE RESECTION;  Surgeon: Luretha MurphyMatthew Martin, MD;  Location: [facility];  Service: General;  Laterality: N/A;  . morphine pump    . SHOULDER ARTHROSCOPY WITH SUBACROMIAL DECOMPRESSION AND BICEP TENDON REPAIR Left 10/06/2013   Procedure: LEFT SHOULDER ARTHROSCOPY DEBRIDEMENT EXTENTSIVE,DISTAL CLAVICULECTOMY,DECOMPRESSION SUBACROMIAL PARTIAL ACROMIOPLASTY WITH ROTATOR CUFF REPAIR, and excision of CALCIUM DEPOSIT;  Surgeon: Loreta Aveaniel F Murphy, MD;  Location: [facility];  Service: Orthopedics;  Laterality: Left;  . SHOULDER ARTHROSCOPY WITH SUBACROMIAL DECOMPRESSION, ROTATOR CUFF REPAIR AND BICEP TENDON REPAIR Right 08/18/2013   Procedure: RIGHT SHOULDER ARTHROSCOPY WITH SUBACROMIAL DECOMPRESSION/PARTIAL ACROMIOPLASTY WITH CORACOACROMIAL RELEASE/DISTAL CLAVICULECTOMY/ ROTATOR CUFF REPAIR/DEBRIDEMENT EXTENTSIVE;  Surgeon: Loreta Aveaniel F Murphy, MD;  Location: [facility];  Service: Orthopedics;  Laterality: Right;  ANESTHESIA: GENERAL, PRE/POST OP SCALENE  . TONSILLECTOMY       Current Meds  Medication Sig  . acetaminophen (TYLENOL) 500 MG tablet Take 1,000 mg by mouth every 6 (six) hours as needed for moderate pain.  Marland Kitchen. aspirin EC 81 MG EC tablet Take  1 tablet (81 mg total) by mouth daily.  . baclofen (LIORESAL) 10 MG tablet Take 1 tablet (10 mg total) by mouth 4 (four) times daily. Must last 30 days  . bumetanide (BUMEX) 1 MG tablet Take 1 tablet (1 mg total) by mouth 2 (two) times daily for 30 days.  Marland Kitchen escitalopram (LEXAPRO) 10 MG tablet Take 10 mg by mouth daily.  Marland Kitchen esomeprazole (NEXIUM) 40 MG capsule Take 1 capsule (40 mg  total) by mouth daily.  . Gabapentin Enacarbil (HORIZANT) 600 MG TBCR Take 1 tablet (600 mg total) by mouth 2 (two) times daily.  . GNP MILK OF MAGNESIA 1200 MG/15ML suspension Take 5 mLs by mouth daily as needed for mild constipation.   Marland Kitchen lubiprostone (AMITIZA) 24 MCG capsule Take 1 capsule (24 mcg total) by mouth 2 (two) times daily with a meal. Swallow the medication whole. Do not break or chew the medication.  . Magnesium Oxide 500 MG CAPS Take 1 capsule (500 mg total) by mouth 2 (two) times daily at 8 am and 10 pm.  . NON FORMULARY 959 mcg by Intrathecal route daily. IT pump Fentanyl 2,000.0 mcg/ml Bupivicaine 20.0 mg/ml 40 ml pump  . ondansetron (ZOFRAN-ODT) 8 MG disintegrating tablet Take 1 tablet (8 mg total) by mouth every 8 (eight) hours as needed for nausea or vomiting.  . Oxycodone HCl 10 MG TABS Take 1 tablet (10 mg total) by mouth every 6 (six) hours as needed for up to 30 days. Must last 30 days  . [START ON 10/23/2018] Oxycodone HCl 10 MG TABS Take 1 tablet (10 mg total) by mouth every 6 (six) hours as needed for up to 30 days. Must last 30 days  . [START ON 11/22/2018] Oxycodone HCl 10 MG TABS Take 1 tablet (10 mg total) by mouth every 6 (six) hours as needed for up to 30 days. Must last 30 days  . polycarbophil (FIBERCON) 625 MG tablet Take 625 mg by mouth as needed for mild constipation.   . polyethylene glycol powder (GLYCOLAX/MIRALAX) 17 GM/SCOOP powder TAKE ONE capful mixed WITH 8 oz OF fluid AS NEEDED  . Wheat Dextrin (GNP BEST FIBER) POWD Take 6 g by mouth 3 (three) times daily before meals. (2 tsp = 6 g). Stir into 8 oz of water, or sprinkle over food.     Allergies:   Tape   Social History   Tobacco Use  . Smoking status: Never Smoker  . Smokeless tobacco: Current User    Types: Snuff  Substance Use Topics  . Alcohol use: Yes    Comment: 2-7 beers per day, starting in Oct 2019.  . Drug use: No     Family Hx: The patient's family history includes Diabetes in  his mother; Pneumonia in his father.  ROS:   Please see the history of present illness.     All other systems reviewed and are negative.   Prior CV studies:   The following studies were reviewed today:    Labs/Other Tests and Data Reviewed:    EKG:  No ECG reviewed.  Recent Labs: 07/11/2018: ALT 24; B Natriuretic Peptide 348.1; Magnesium 2.1; TSH 0.938 07/15/2018: BUN 14; Creatinine, Ser 0.78; Hemoglobin 14.9; Platelets 220; Potassium 4.5; Sodium 136   Recent Lipid Panel Lab Results  Component Value Date/Time   TRIG 76 06/11/2018 05:39 AM    Wt Readings from Last 3 Encounters:  10/22/18 (!) 377 lb (171 kg)  09/16/18 (!) 375 lb (170.1 kg)  08/04/18 (!) 370  lb (167.8 kg)     Objective:    Vital Signs:  Ht 5\' 11"  (1.803 m)   Wt (!) 377 lb (171 kg)   BMI 52.58 kg/m    VITAL SIGNS:  reviewed GEN:  no acute distress EYES:  sclerae anicteric, EOMI - Extraocular Movements Intact RESPIRATORY:  normal respiratory effort, symmetric expansion CARDIOVASCULAR:  no peripheral edema SKIN:  no rash, lesions or ulcers. MUSCULOSKELETAL:  no obvious deformities. NEURO:  alert and oriented x 3, no obvious focal deficit PSYCH:  normal affect  ASSESSMENT & PLAN:    1. Dyspnea on exertion   2. Chronic diastolic congestive heart failure (HCC)   3. Medication management    HFpEF chronic - increase bumex to 2 mg in am and 1 mg in PM.  Monitor for diuresis. Check renal function in 1 week. Can continue to increase dose if diuresis improves, or continue dose at 2 mg am 1 mg pm.   COVID-19 Education: The signs and symptoms of COVID-19 were discussed with the patient and how to seek care for testing (follow up with PCP or arrange E-visit).  The importance of social distancing was discussed today.  Time:   Today, I have spent 15 minutes with the patient with telehealth technology discussing the above problems.     Medication Adjustments/Labs and Tests Ordered: Current medicines are  reviewed at length with the patient today.  Concerns regarding medicines are outlined above.   Tests Ordered: No orders of the defined types were placed in this encounter.   Medication Changes: No orders of the defined types were placed in this encounter.   Follow Up:  Virtual Visit or In Person in 3 month(s)  Signed, Parke PoissonGayatri A Chelbi Herber, MD  10/22/2018 1:31 PM    [facility]

## 2018-10-22 NOTE — Patient Instructions (Addendum)
Medication Instructions:  Increased Bumex 2 mg in morning and 1 mg in evening  And monitor  For volume response   If you need a refill on your cardiac medications before your next appointment, please call your pharmacy.   Lab work:  BMP - ON 10/29/18 Friday   MAY COME BY OFFICE TO USE  [facility], NO APPOINTMENT IS NEEDED BETWEEN 8 AM - 4 PM  If you have labs (blood work) drawn today and your tests are completely normal, you will receive your results only by: Marland Kitchen MyChart Message (if you have MyChart) OR . A paper copy in the mail If you have any lab test that is abnormal or we need to change your treatment, we will call you to review the results.  Testing/Procedures:   Follow-Up: At [facility], you and your health needs are our priority.  As part of our continuing mission to provide you with exceptional heart care, we have created designated Provider Care Teams.  These Care Teams include your primary Cardiologist (physician) and Advanced Practice Providers (APPs -  Physician Assistants and Nurse Practitioners) who all work together to provide you with the care you need, when you need it. . You will need a follow up appointment in  3 months.  Please call our office 2 months in advance to schedule this appointment.  You may see  one of the following Advanced Practice Providers on your designated Care Team:   . Rosaria Ferries, PA-C . Jory Sims, DNP, ANP  Any Other Special Instructions Will Be Listed Below (If Applicable).

## 2018-10-25 ENCOUNTER — Other Ambulatory Visit: Payer: Self-pay | Admitting: Pain Medicine

## 2018-10-25 DIAGNOSIS — E559 Vitamin D deficiency, unspecified: Secondary | ICD-10-CM

## 2018-10-25 MED ORDER — ERGOCALCIFEROL 1.25 MG (50000 UT) PO CAPS: 50000.0000 [IU] | ORAL_CAPSULE | ORAL | 2 refills | Status: DC

## 2018-10-25 MED ORDER — VITAMIN D3 125 MCG (5000 UT) PO CAPS: 1.0000 | ORAL_CAPSULE | Freq: Every day | ORAL | 3 refills | Status: DC

## 2018-10-25 MED ORDER — CALCIUM CARBONATE 600 MG PO TABS: 600.0000 mg | ORAL_TABLET | Freq: Two times a day (BID) | ORAL | 3 refills | Status: DC

## 2018-10-27 ENCOUNTER — Telehealth: Payer: Self-pay

## 2018-10-27 NOTE — Telephone Encounter (Signed)
Left messge to inform patient that he needs to get a covid test done prior to getting his pump refill.  Orders placed,

## 2018-11-04 ENCOUNTER — Other Ambulatory Visit: Payer: Self-pay

## 2018-11-04 ENCOUNTER — Ambulatory Visit: Payer: Medicare Other | Attending: Pain Medicine | Admitting: Pain Medicine

## 2018-11-04 ENCOUNTER — Encounter: Payer: Self-pay | Admitting: Pain Medicine

## 2018-11-04 VITALS — BP 117/74 | HR 72 | Temp 98.4°F | Resp 20 | Ht 71.0 in | Wt 377.0 lb

## 2018-11-04 DIAGNOSIS — Z451 Encounter for adjustment and management of infusion pump: Secondary | ICD-10-CM | POA: Diagnosis present

## 2018-11-04 DIAGNOSIS — M79604 Pain in right leg: Secondary | ICD-10-CM | POA: Insufficient documentation

## 2018-11-04 DIAGNOSIS — M7918 Myalgia, other site: Secondary | ICD-10-CM | POA: Insufficient documentation

## 2018-11-04 DIAGNOSIS — G894 Chronic pain syndrome: Secondary | ICD-10-CM | POA: Diagnosis not present

## 2018-11-04 DIAGNOSIS — Z978 Presence of other specified devices: Secondary | ICD-10-CM | POA: Diagnosis present

## 2018-11-04 DIAGNOSIS — M545 Low back pain, unspecified: Secondary | ICD-10-CM

## 2018-11-04 DIAGNOSIS — M47816 Spondylosis without myelopathy or radiculopathy, lumbar region: Secondary | ICD-10-CM | POA: Insufficient documentation

## 2018-11-04 DIAGNOSIS — M961 Postlaminectomy syndrome, not elsewhere classified: Secondary | ICD-10-CM | POA: Insufficient documentation

## 2018-11-04 DIAGNOSIS — G8929 Other chronic pain: Secondary | ICD-10-CM | POA: Diagnosis present

## 2018-11-04 DIAGNOSIS — M79605 Pain in left leg: Secondary | ICD-10-CM | POA: Diagnosis present

## 2018-11-04 DIAGNOSIS — M5137 Other intervertebral disc degeneration, lumbosacral region: Secondary | ICD-10-CM | POA: Diagnosis present

## 2018-11-04 DIAGNOSIS — M62838 Other muscle spasm: Secondary | ICD-10-CM | POA: Insufficient documentation

## 2018-11-04 DIAGNOSIS — K219 Gastro-esophageal reflux disease without esophagitis: Secondary | ICD-10-CM | POA: Insufficient documentation

## 2018-11-04 MED ORDER — BACLOFEN 10 MG PO TABS: 20.0000 mg | ORAL_TABLET | Freq: Four times a day (QID) | ORAL | 2 refills | Status: DC

## 2018-11-04 NOTE — Patient Instructions (Signed)
____________________________________________________________________________________________  Muscle Spasms & Cramps  Cause:  The most common cause of muscle spasms and cramps is vitamin and/or electrolyte (calcium, potassium, sodium, etc.) deficiencies.  Possible triggers: Sweating - causes loss of electrolytes thru the skin. Steroids - causes loss of electrolytes thru the urine.  Treatment: 1. Gatorade (or any other electrolyte-replenishing drink) - Take 1, 8 oz glass with each meal (3 times a day). 2. OTC (over-the-counter) Magnesium 400 to 500 mg - Take 1 tablet twice a day (one with breakfast and one before bedtime). If you have kidney problems, talk to your primary care physician before taking any Magnesium. 3. Tonic Water with quinine - Take 1, 8 oz glass before bedtime.   ____________________________________________________________________________________________    

## 2018-11-04 NOTE — Progress Notes (Signed)
Patient's Name: [name]  MRN: [id number]  Referring Provider: Antony Contras, MD  DOB: [birth year]  PCP: Antony Contras, MD  DOS: 11/04/2018  Note by: Gaspar Cola, MD  Service setting: Ambulatory outpatient  Specialty: Interventional Pain Management  Patient type: Established  Location: [facility] (AMB) Pain Management Facility  Visit type: Interventional Procedure   Primary Reason for Visit: Interventional Pain Management Treatment. CC: Back Pain (lower)  Procedure:          Intrathecal Drug Delivery System (IDDS):  Type: Reservoir Refill [phone])       Region: Abdominal Laterality: Left  Type of Pump: Medtronic Synchromed II Delivery Route: Intrathecal Type of Pain Treated: Neuropathic/Nociceptive Primary Medication Class: Opioid/opiate  Medication, Concentration, Infusion Program, & Delivery Rate: Please see scanned programming printout.   Indications: 1. Chronic pain syndrome   2. Failed back surgical syndrome   3. DDD (degenerative disc disease), lumbosacral   4. Chronic low back pain (Primary Area of Pain) (Bilateral) (R>L)   5. Chronic lower extremity pain (Bilateral) (L>R)   6. Lumbar facet syndrome (Bilateral) (R>L)   7. Presence of intrathecal pump   8. Encounter for adjustment or management of infusion pump    Pain Assessment: Self-Reported Pain Score: 5 /10             Reported level is compatible with observation.        Pharmacotherapy Assessment  Analgesic: Oxycodone IR 10 mg 1 tablet p.o. every 6 hours (40 mg/day of oxycodone)+ 44 mcg/hr intrathecal PF-Fentanyl (1,056.1 mcg/day of Fentanyl)(enough oral until 12/27/2018) MME/day:7m/day(from oral medication)+ 105.6 mg/day (from intrathecal medication) = 165.6MME/day(Aprox. 0.98MME/day/kg).    Monitoring: Pharmacotherapy: No side-effects or adverse reactions reported. [place] PMP: PDMP reviewed during this encounter.       Compliance: No problems identified. Effectiveness: Clinically acceptable. Plan:  Refer to "POC".  Intrathecal Pump Therapy Assessment  Manufacturer: Medtronic Synchromed Type: Programmable Volume: 40 mL reservoir MRI compatibility: Yes   Drug content:  Primary Medication Class:Opioid Primary Medication:PF-Fentanyl(2,000.0 mcg/ml) Secondary Medication:PF-Bupivacaine(261mmL) Other Medication: None   Programming:  Type: Simple continuous. See pump readout for details.   Changes:  Medication Change: None at this point Rate Change: No change in rate  Reported side-effects or adverse reactions: None reported  Effectiveness: Described as relatively effective, allowing for increase in activities of daily living (ADL) Clinically meaningful improvement in function (CMIF): Sustained CMIF goals met  Plan: Pump refill today  Pre-op Assessment:  [name] a [age].o. (year old), male patient, seen today for interventional treatment. He  has a past surgical history that includes Back surgery; morphine pump; Tonsillectomy; Joint replacement; Shoulder arthroscopy with subacromial decompression, rotator cuff repair and bicep tendon repair (Right, 08/18/2013); Shoulder arthroscopy with subacromial decompression and bicep tendon repair (Left, 10/06/2013); and Laparoscopic gastric sleeve resection (N/A, 01/08/2015). [name] a current medication list which includes the following prescription(s): acetaminophen, aspirin, baclofen, bumetanide, calcium carbonate, vitamin d3, ergocalciferol, escitalopram, esomeprazole, gabapentin enacarbil, gnp milk of magnesia, lubiprostone, magnesium oxide, NON FORMULARY, ondansetron, oxycodone hcl, oxycodone hcl, polycarbophil, polyethylene glycol powder, gnp best fiber, and oxycodone hcl. His primarily concern today is the Back Pain (lower)  Initial Vital Signs:  Pulse/HCG Rate: 72  Temp: 98.4 F (36.9 C) Resp: 20 BP: 117/74 SpO2: 100 %  BMI: Estimated body mass index is 52.58 kg/m as calculated from the following:   Height as  of this encounter: _0  (1.803 m).   Weight as of this encounter: 377 lb (171 kg).  Risk Assessment: Allergies: Reviewed. He is allergic to tape.  Allergy Precautions: None required Coagulopathies: Reviewed. None identified.  Blood-thinner therapy: None at this time Active Infection(s): Reviewed. None identified. [name] is afebrile  Site Confirmation: [name] was asked to confirm the procedure and laterality before marking the site Procedure checklist: Completed Consent: Before the procedure and under the influence of no sedative(s), amnesic(s), or anxiolytics, the patient was informed of the treatment options, risks and possible complications. To fulfill our ethical and legal obligations, as recommended by the American Medical Association's Code of Ethics, I have informed the patient of my clinical impression; the nature and purpose of the treatment or procedure; the risks, benefits, and possible complications of the intervention; the alternatives, including doing nothing; the risk(s) and benefit(s) of the alternative treatment(s) or procedure(s); and the risk(s) and benefit(s) of doing nothing.  [name] was provided with information about the general risks and possible complications associated with most interventional procedures. These include, but are not limited to: failure to achieve desired goals, infection, bleeding, organ or nerve damage, allergic reactions, paralysis, and/or death.  In addition, he was informed of those risks and possible complications associated to this particular procedure, which include, but are not limited to: damage to the implant; failure to decrease pain; local, systemic, or serious CNS infections, intraspinal abscess with possible cord compression and paralysis, or life-threatening such as meningitis; bleeding; organ damage; nerve injury or damage with subsequent sensory, motor, and/or autonomic system dysfunction, resulting in transient or permanent  pain, numbness, and/or weakness of one or several areas of the body; allergic reactions, either minor or major life-threatening, such as anaphylactic or anaphylactoid reactions.  Furthermore, Mr. Pienta was informed of those risks and complications associated with the medications. These include, but are not limited to: allergic reactions (i.e.: anaphylactic or anaphylactoid reactions); endorphine suppression; bradycardia and/or hypotension; water retention and/or peripheral vascular relaxation leading to lower extremity edema and possible stasis ulcers; respiratory depression and/or shortness of breath; decreased metabolic rate leading to weight gain; swelling or edema; medication-induced neural toxicity; particulate matter embolism and blood vessel occlusion with resultant organ, and/or nervous system infarction; and/or intrathecal granuloma formation with possible spinal cord compression and permanent paralysis.  Before refilling the pump Mr. Corcoran was informed that some of the medications used in the devise may not be FDA approved for such use and therefore it constitutes an off-label use of the medications.  Finally, he was informed that Medicine is not an exact science; therefore, there is also the possibility of unforeseen or unpredictable risks and/or possible complications that may result in a catastrophic outcome. The patient indicated having understood very clearly. We have given the patient no guarantees and we have made no promises. Enough time was given to the patient to ask questions, all of which were answered to the patient's satisfaction. Mr. Pitcock has indicated that he wanted to continue with the procedure. Attestation: I, the ordering provider, attest that I have discussed with the patient the benefits, risks, side-effects, alternatives, likelihood of achieving goals, and potential problems during recovery for the procedure that I have provided informed consent. Date  Time: 11/04/2018  11:14 AM  Pre-Procedure Preparation:  Monitoring: As per clinic protocol. Respiration, ETCO2, SpO2, BP, heart rate and rhythm monitor placed and checked for adequate function Safety Precautions: Patient was assessed for positional comfort and pressure points before starting the procedure. Time-out: I initiated and conducted the "Time-out" before starting the procedure, as per protocol. The patient was asked  to participate by confirming the accuracy of the "Time Out" information. Verification of the correct person, site, and procedure were performed and confirmed by me, the nursing staff, and the patient. "Time-out" conducted as per Joint Commission's Universal Protocol (UP.01.01.01). Time: 1117  Description of Procedure:          Position: Supine Target Area: Central-port of intrathecal pump. Approach: Anterior, 90 degree angle approach. Area Prepped: Entire Area around the pump implant. Prepping solution: DuraPrep (Iodine Povacrylex [0.7% available iodine] and Isopropyl Alcohol, 74% w/w) Safety Precautions: Aspiration looking for blood return was conducted prior to all injections. At no point did we inject any substances, as a needle was being advanced. No attempts were made at seeking any paresthesias. Safe injection practices and needle disposal techniques used. Medications properly checked for expiration dates. SDV (single dose vial) medications used. Description of the Procedure: Protocol guidelines were followed. Two nurses trained to do implant refills were present during the entire procedure. The refill medication was checked by both healthcare providers as well as the patient. The patient was included in the "Time-out" to verify the medication. The patient was placed in position. The pump was identified. The area was prepped in the usual manner. The sterile template was positioned over the pump, making sure the side-port location matched that of the pump. Both, the pump and the template were  held for stability. The needle provided in the Medtronic Kit was then introduced thru the center of the template and into the central port. The pump content was aspirated and discarded volume documented. The new medication was slowly infused into the pump, thru the filter, making sure to avoid overpressure of the device. The needle was then removed and the area cleansed, making sure to leave some of the prepping solution back to take advantage of its long term bactericidal properties. The pump was interrogated and programmed to reflect the correct medication, volume, and dosage. The program was printed and taken to the physician for approval. Once checked and signed by the physician, a copy was provided to the patient and another scanned into the EMR. Vitals:   11/04/18 1113  BP: 117/74  Pulse: 72  Resp: 20  Temp: 98.4 F (36.9 C)  TempSrc: Oral  SpO2: 100%  Weight: (!) 377 lb (171 kg)  Height: _0  (1.803 m)    Start Time: 1125 hrs. End Time: 1142 hrs. Materials & Medications: Medtronic Refill Kit Medication(s): Please see chart orders for details.  Imaging Guidance:          Type of Imaging Technique: None used Indication(s): N/A Exposure Time: No patient exposure Contrast: None used. Fluoroscopic Guidance: N/A Ultrasound Guidance: N/A Interpretation: N/A  Antibiotic Prophylaxis:   Anti-infectives (From admission, onward)   None     Indication(s): None identified  Post-operative Assessment:  Post-procedure Vital Signs:  Pulse/HCG Rate: 72  Temp: 98.4 F (36.9 C) Resp: 20 BP: 117/74 SpO2: 100 %  EBL: None  Complications: No immediate post-treatment complications observed by team, or reported by patient.  Note: The patient tolerated the entire procedure well. A repeat set of vitals were taken after the procedure and the patient was kept under observation following institutional policy, for this type of procedure. Post-procedural neurological assessment was performed,  showing return to baseline, prior to discharge. The patient was provided with post-procedure discharge instructions, including a section on how to identify potential problems. Should any problems arise concerning this procedure, the patient was given instructions to immediately contact us, at  any time, without hesitation. In any case, we plan to contact the patient by telephone for a follow-up status report regarding this interventional procedure.  Comments:  No additional relevant information.  Plan of Care  Orders:  Orders Placed This Encounter  Procedures  . PUMP REFILL    Maintain Protocol by having two(2) healthcare providers during procedure and programming.    Scheduling Instructions:     Please refill intrathecal pump today.    Order Specific Question:   Where will this procedure be performed?    Answer:   [facility]  . PUMP REFILL    Whenever possible schedule on a procedure today.    Standing Status:   Future    Standing Expiration Date:   04/03/2019    Scheduling Instructions:     Please schedule intrathecal pump refill based on pump programming. Avoid schedule intervals of more than 120 days (4 months).    Order Specific Question:   Where will this procedure be performed?    Answer:   [facility]  . Informed Consent Details: Transcribe to consent form and obtain patient signature    Consent Attestation: I, the ordering provider, attest that I have discussed with the patient the benefits, risks, side-effects, alternatives, likelihood of achieving goals, and potential problems during recovery for the procedure that I have provided informed consent.    Scheduling Instructions:     Procedure: Intrathecal Pump Refill     Attending Physician: Beatriz Chancellor A. Dossie Arbour, MD     Indications: Chronic Pain Syndrome (G89.4)   Chronic Opioid Analgesic:  Oxycodone IR 10 mg 1 tablet p.o. every 6 hours (40 mg/day of oxycodone)+ 44 mcg/hr intrathecal PF-Fentanyl (1,056.1  mcg/day of Fentanyl)(enough oral until 12/27/2018) MME/day:42m/day(from oral medication)+ 105.6 mg/day (from intrathecal medication) = 165.6MME/day(Aprox. 0.98MME/day/kg).   Medications ordered for procedure: Meds ordered this encounter  Medications  . baclofen (LIORESAL) 10 MG tablet    Sig: Take 2 tablets (20 mg total) by mouth 4 (four) times daily. Must last 30 days    Dispense:  240 tablet    Refill:  2    Fill one day early if pharmacy is closed on scheduled refill date. May substitute for generic if available.   Medications administered: JJeneen RinksB. Sen had no medications administered during this visit.  See the medical record for exact dosing, route, and time of administration.  Follow-up plan:   Return in about 8 weeks (around 12/27/2018) for Pump Refill (Max:335mo       Interventional management options: Considering: NOTE: NO RFA until BMI <35  Palliative/therapeuticintrathecal pump refill and maintenance Diagnostic left Genicular nerve block Possible left Genicular nerve RFA Possible bilateral lumbar facet RFA(NO Lumbar RFA until BMI <35) Diagnostic caudal epidural steroid injection+epidurogram Possible Racz procedure Diagnostic bilateral intra-articular shoulder joint injection Diagnostic bilateral suprascapular nerve block Possible bilateral suprascapular nerve RFA Diagnostic interlaminar ESI versus transforaminal ESI   Palliative PRN treatment(s): Palliative/therapeuticintrathecal pump refill and maintenance Diagnostic bilateral lumbar facet block #2    Recent Visits Date Type Provider Dept  09/16/18 Procedure visit Na[facility]inda PointerMD [facility]  Showing recent visits within past 90 days and meeting all other requirements   Today's Visits Date Type Provider Dept  11/04/18 Procedure visit NaMilinda PointerMD [facility]  Showing today's visits and meeting all other requirements   Future  Appointments Date Type Provider Dept  [facility]  12/16/18 Appointment NaMilinda PointerMD [facility]  Showing future appointments within next 90 days and meeting all other requirements   Disposition: Discharge home  Discharge Date & Time: 11/04/2018; 1152 hrs.   Primary Care Physician: Antony Contras, MD Location: [facility] Note by: Gaspar Cola, MD Date: 11/04/2018; Time: 12:24 PM  Disclaimer:  Medicine is not an Chief Strategy Officer. The only guarantee in medicine is that nothing is guaranteed. It is important to note that the decision to proceed with this intervention was based on the information collected from the patient. The Data and conclusions were drawn from the patient's questionnaire, the interview, and the physical examination. Because the information was provided in large part by the patient, it cannot be guaranteed that it has not been purposely or unconsciously manipulated. Every effort has been made to obtain as much relevant data as possible for this evaluation. It is important to note that the conclusions that lead to this procedure are derived in large part from the available data. Always take into account that the treatment will also be dependent on availability of resources and existing treatment guidelines, considered by other Pain Management Practitioners as being common knowledge and practice, at the time of the intervention. For Medico-Legal purposes, it is also important to point out that variation in procedural techniques and pharmacological choices are the acceptable norm. The indications, contraindications, technique, and results of the above procedure should only be interpreted and judged by a Board-Certified Interventional Pain Specialist with extensive familiarity and expertise in the same exact procedure and technique.

## 2018-11-05 ENCOUNTER — Telehealth: Payer: Self-pay | Admitting: *Deleted

## 2018-11-05 NOTE — Telephone Encounter (Signed)
Voicemail left with patient to call if there are any questions or concerns re; pump fill on yesterday.

## 2018-11-09 ENCOUNTER — Encounter: Payer: Medicare Other | Admitting: Pain Medicine

## 2018-11-15 MED FILL — Medication: INTRATHECAL | Qty: 1 | Status: AC

## 2018-11-23 ENCOUNTER — Telehealth: Payer: Self-pay | Admitting: Pain Medicine

## 2018-11-23 NOTE — Telephone Encounter (Signed)
Patient called to let Dr. Dossie Arbour know how the muscle relaxer is doing. He says it has helped but not much. Still having muscle spasms strong enough that the next day he is sore and hurting. Are there any options? Next appt is Sept for pump refill. I informed patient Dr. Dossie Arbour would not be in until 11-29-18

## 2018-11-25 NOTE — Telephone Encounter (Signed)
Please schedule this patient for an e-visit with Dr. Dossie Arbour if he would like to discuss this issue prior to his next scheduled appointment. As per his policy, no med changes via phone. Thank you- Anderson Malta.

## 2018-11-25 NOTE — Telephone Encounter (Signed)
Spoke with patient about an appt, there are no available appts sooner than 12-16-18 which the patient is coming in for Pump Refill. He understands and will talk with Dr. Dossie Arbour at this time.

## 2018-12-02 ENCOUNTER — Other Ambulatory Visit: Payer: Self-pay

## 2018-12-02 MED ORDER — PAIN MANAGEMENT IT PUMP REFILL: 1.0000 | Freq: Once | INTRATHECAL | 0 refills | Status: AC

## 2018-12-13 ENCOUNTER — Ambulatory Visit: Payer: Medicare Other | Admitting: Pain Medicine

## 2018-12-16 ENCOUNTER — Other Ambulatory Visit: Payer: Self-pay

## 2018-12-16 ENCOUNTER — Encounter: Payer: Self-pay | Admitting: Pain Medicine

## 2018-12-16 ENCOUNTER — Ambulatory Visit: Payer: No Typology Code available for payment source | Attending: Pain Medicine | Admitting: Pain Medicine

## 2018-12-16 VITALS — BP 150/83 | HR 106 | Temp 98.9°F | Resp 20 | Ht 71.0 in | Wt 390.0 lb

## 2018-12-16 DIAGNOSIS — G894 Chronic pain syndrome: Secondary | ICD-10-CM | POA: Insufficient documentation

## 2018-12-16 DIAGNOSIS — M79604 Pain in right leg: Secondary | ICD-10-CM | POA: Insufficient documentation

## 2018-12-16 DIAGNOSIS — Z451 Encounter for adjustment and management of infusion pump: Secondary | ICD-10-CM | POA: Diagnosis present

## 2018-12-16 DIAGNOSIS — G8929 Other chronic pain: Secondary | ICD-10-CM

## 2018-12-16 DIAGNOSIS — M79605 Pain in left leg: Secondary | ICD-10-CM | POA: Diagnosis present

## 2018-12-16 DIAGNOSIS — M961 Postlaminectomy syndrome, not elsewhere classified: Secondary | ICD-10-CM

## 2018-12-16 DIAGNOSIS — M545 Low back pain, unspecified: Secondary | ICD-10-CM

## 2018-12-16 DIAGNOSIS — M5137 Other intervertebral disc degeneration, lumbosacral region: Secondary | ICD-10-CM | POA: Insufficient documentation

## 2018-12-16 DIAGNOSIS — Z978 Presence of other specified devices: Secondary | ICD-10-CM | POA: Diagnosis present

## 2018-12-16 NOTE — Patient Instructions (Signed)
Opioid Overdose Opioids are drugs that are often used to treat pain. Opioids include illegal drugs, such as heroin, as well as prescription pain medicines, such as codeine, morphine, hydrocodone, oxycodone, and fentanyl. An opioid overdose happens when you take too much of an opioid. An overdose may be intentional or accidental and can happen with any type of opioid. The effects of an overdose can be mild, dangerous, or even deadly. Opioid overdose is a medical emergency. What are the causes? This condition may be caused by:  Taking too much of an opioid on purpose.  Taking too much of an opioid by accident.  Using two or more substances that contain opioids at the same time.  Taking an opioid with a substance that affects your heart, breathing, or blood pressure. These include alcohol, tranquilizers, sleeping pills, illegal drugs, and some over-the-counter medicines. This condition may also happen due to an error made by:  A health care provider who prescribes a medicine.  The pharmacist who fills the prescription order. What increases the risk? This condition is more likely in:  Children. They may be attracted to colorful pills. Because of a child's small size, even a small amount of a drug can be dangerous.  Older people. They may be taking many different drugs. Older people may have difficulty reading labels or remembering when they last took their medicine. They may also be more sensitive to the effects of opioids.  People with chronic medical conditions, especially heart, liver, kidney, or neurological diseases.  People who take an opioid for a long period of time.  People who use: ? Illegal drugs. IV heroin is especially dangerous. ? Other substances, including alcohol, while using an opioid.  People who have: ? A history of drug or alcohol abuse. ? Certain mental health conditions. ? A history of previous drug overdoses.  People who take opioids that are not prescribed  for them. What are the signs or symptoms? Symptoms of this condition depend on the type of opioid and the amount that was taken. Common symptoms include:  Sleepiness or difficulty waking from sleep.  Decrease in attention.  Confusion.  Slurred speech.  Slowed breathing and a slow pulse (bradycardia).  Nausea and vomiting.  Abnormally small pupils. Signs and symptoms that require emergency treatment include:  Cold, clammy, and pale skin.  Blue lips and fingernails.  Vomiting.  Gurgling sounds in the throat.  A pulse that is very slow or difficult to detect.  Breathing that is very irregular, slow, noisy, or difficult to detect.  Limp body.  Inability to respond to speech or be awakened from sleep (stupor).  Seizures. How is this diagnosed? This condition is diagnosed based on your symptoms and medical history. It is important to tell your health care provider:  About all of the opioids that you took.  When you took the opioids.  Whether you were drinking alcohol or using marijuana, cocaine, or other drugs. Your health care provider will do a physical exam. This exam may include:  Checking and monitoring your heart rate and rhythm, breathing rate, temperature, and blood pressure (vital signs).  Measuring oxygen levels in your blood.  Checking for abnormally small pupils. You may also have blood tests or urine tests. You may have X-rays if you are having severe breathing problems. How is this treated? This condition requires immediate medical treatment and hospitalization. Treatment is given in the hospital intensive care (ICU) setting. Supporting your vital signs and your breathing is the first step in   treating an opioid overdose. Treatment may also include:  Giving salts and minerals (electrolytes) along with fluids through an IV.  Inserting a breathing tube (endotracheal tube) in your airway to help you breathe if you cannot breathe on your own or you are in  danger of not being able to breathe on your own.  Giving oxygen through a small tube under your nose.  Passing a tube through your nose and into your stomach (nasogastric tube, or NG tube) to empty your stomach.  Giving medicines that: ? Increase your blood pressure. ? Relieve nausea and vomiting. ? Relieve abdominal pain and cramping. ? Reverse the effects of the opioid (naloxone).  Monitoring your heart and oxygen levels.  Ongoing counseling and mental health support if you intentionally overdosed or used an illegal drug. Follow these instructions at home:  Medicines  Take over-the-counter and prescription medicines only as told by your health care provider.  Always ask your health care provider about possible side effects and interactions of any new medicine that you start taking.  Keep a list of all the medicines that you take, including over-the-counter medicines. Bring this list with you to all your medical visits. General instructions  Drink enough fluid to keep your urine pale yellow.  Keep all follow-up visits as told by your health care provider. This is important. How is this prevented?  Read the drug inserts that come with your opioid pain medicines.  Take medicines only as told by your health care provider. Do not take more medicine than you are told. Do not take medicines more frequently than you are told.  Do not drink alcohol or take sedatives when taking opioids.  Do not use illegal or recreational drugs, including cocaine, ecstasy, and marijuana.  Do not take opioid medicines that are not prescribed for you.  Store all medicines in safety containers that are out of the reach of children.  Get help if you are struggling with: ? Alcohol or drug use. ? Depression or another mental health problem. ? Thoughts of hurting yourself or another person.  Keep the phone number of your local poison control center near your phone or in your mobile phone. In the  [place] the hotline of the National Poison Control Center is (800) 222-1222.  If you were prescribed naloxone, make sure you understand how to take it. Contact a health care provider if you:  Need help understanding how to take your pain medicines.  Feel your medicines are too strong.  Are concerned that your pain medicines are not working well for your pain.  Develop new symptoms or side effects when you are taking medicines. Get help right away if:  You or someone else is having symptoms of an opioid overdose. Get help even if you are not sure.  You have serious thoughts about hurting yourself or others.  You have: ? Chest pain. ? Difficulty breathing. ? A loss of consciousness. These symptoms may represent a serious problem that is an emergency. Do not wait to see if the symptoms will go away. Get medical help right away. Call your local emergency services (911 in the [place] Do not drive yourself to the hospital. If you ever feel like you may hurt yourself or others, or have thoughts about taking your own life, get help right away. You can go to your nearest emergency department or call:  Your local emergency services (911 in the [place]  A suicide crisis helpline, such as the National Suicide Prevention Lifeline at   1-800-273-8255. This is open 24 hours a day. Summary  Opioids are drugs that are often used to treat pain. Opioids include illegal drugs, such as heroin, as well as prescription pain medicines.  An opioid overdose happens when you take too much of an opioid.  Overdoses can be intentional or accidental.  Opioid overdose is very dangerous. It is a life-threatening emergency.  If you or someone you know is experiencing an opioid overdose, get help right away. This information is not intended to replace advice given to you by your health care provider. Make sure you discuss any questions you have with your health care provider. Document Released: 05/08/2004 Document  Revised: 03/18/2018 Document Reviewed: 03/18/2018 [employer] Patient Education  2020 [employer]  

## 2018-12-16 NOTE — Progress Notes (Signed)
Safety precautions to be maintained throughout the outpatient stay will include: orient to surroundings, keep bed in low position, maintain call bell within reach at all times, provide assistance with transfer out of bed and ambulation.  

## 2018-12-16 NOTE — Progress Notes (Addendum)
Patient's Name: [name]  MRN: [id number]  Referring Provider: Antony Contras, MD  DOB: [birth year]  PCP: Antony Contras, MD  DOS: 12/16/2018  Note by: Gaspar Cola, MD  Service setting: Ambulatory outpatient  Specialty: Interventional Pain Management  Patient type: Established  Location: [facility] (AMB) Pain Management Facility  Visit type: Interventional Procedure   Primary Reason for Visit: Interventional Pain Management Treatment. CC: Back Pain (low) and Knee Pain (bilateral)  Procedure:          Intrathecal Drug Delivery System (IDDS):  Type: Reservoir Refill [id number]) No rate change Region: Abdominal Laterality: Left  Type of Pump: Medtronic Synchromed II Delivery Route: Intrathecal Type of Pain Treated: Neuropathic/Nociceptive Primary Medication Class: Opioid/opiate  Medication, Concentration, Infusion Program, & Delivery Rate: Please see scanned programming printout.   Indications: 1. Chronic pain syndrome   2. Failed back surgical syndrome   3. Chronic low back pain (Primary Area of Pain) (Bilateral) (R>L)   4. Chronic lower extremity pain (Bilateral) (L>R)   5. DDD (degenerative disc disease), lumbosacral   6. Presence of intrathecal pump   7. Encounter for interrogation of infusion pump   8. Encounter for adjustment or management of infusion pump    Pain Assessment: Self-Reported Pain Score: 5 /10             Reported level is compatible with observation.        Pharmacotherapy Assessment  Analgesic: Oxycodone IR 10 mg 1 tablet p.o. every 6 hours (40 mg/day of oxycodone)+ 44 mcg/hr intrathecal PF-Fentanyl (1,056.1 mcg/day of Fentanyl)(enough oral until 12/27/2018) MME/day:30m/day(from oral medication)+ 105.6 mg/day (from intrathecal medication) = 165.6MME/day(Aprox. 0.98MME/day/kg).    Monitoring: Pharmacotherapy: No side-effects or adverse reactions reported. [place] PMP: PDMP reviewed during this encounter.       Compliance: No problems  identified. Effectiveness: Clinically acceptable. Plan: Refer to "POC".  UDS:  Summary  Date Value Ref Range Status  06/30/2018 FINAL  Final    Comment:    ==================================================================== TOXASSURE SELECT 13 (MW) ==================================================================== Test                             Result       Flag       Units Drug Present and Declared for Prescription Verification   Oxycodone                      955          EXPECTED   ng/mg creat   Oxymorphone                    2975         EXPECTED   ng/mg creat   Noroxycodone                   1684         EXPECTED   ng/mg creat   Noroxymorphone                 596          EXPECTED   ng/mg creat    Sources of oxycodone are scheduled prescription medications.    Oxymorphone, noroxycodone, and noroxymorphone are expected    metabolites of oxycodone. Oxymorphone is also available as a    scheduled prescription medication. Drug Present not Declared for Prescription Verification   Morphine  141          UNEXPECTED ng/mg creat    Potential sources of morphine include administration of codeine    or morphine, use of heroin, or ingestion of poppy seeds.   Fentanyl                       6            UNEXPECTED ng/mg creat   Norfentanyl                    48           UNEXPECTED ng/mg creat    Source of fentanyl is a scheduled prescription medication,    including IV, patch, and transmucosal formulations. Norfentanyl    is an expected metabolite of fentanyl. ==================================================================== Test                      Result    Flag   Units      Ref Range   Creatinine              233              mg/dL      >=20 ==================================================================== Declared Medications:  The flagging and interpretation on this report are based on the  following declared medications.  Unexpected results may  arise from  inaccuracies in the declared medications.  **Note: The testing scope of this panel includes these medications:  Oxycodone  **Note: The testing scope of this panel does not include following  reported medications:  Potassium  Promethazine  Vitamin B1 (Thiamine) ==================================================================== For clinical consultation, please call [phone]. ====================================================================    Intrathecal Pump Therapy Assessment  Manufacturer: Medtronic Synchromed Type: Programmable Volume: 40 mL reservoir MRI compatibility: Yes   Drug content:  Primary Medication Class:Opioid Primary Medication:PF-Fentanyl(2,000.0 mcg/ml) Secondary Medication:PF-Bupivacaine(56m/mL) Other Medication: None   Programming:  Type: Simple continuous. See pump readout for details.   Changes:  Medication Change: None at this point Rate Change: No change in rate  Reported side-effects or adverse reactions: None reported  Effectiveness: Described as relatively effective, allowing for increase in activities of daily living (ADL) Clinically meaningful improvement in function (CMIF): Sustained CMIF goals met  Plan: Pump refill today  Pre-op Assessment:  [name] a 53y.o. (year old), male patient, seen today for interventional treatment. He  has a past surgical history that includes Back surgery; morphine pump; Tonsillectomy; Joint replacement; Shoulder arthroscopy with subacromial decompression, rotator cuff repair and bicep tendon repair (Right, 08/18/2013); Shoulder arthroscopy with subacromial decompression and bicep tendon repair (Left, 10/06/2013); and Laparoscopic gastric sleeve resection (N/A, 01/08/2015). Mr. HCybulskihas a current medication list which includes the following prescription(s): acetaminophen, aspirin, baclofen, bumetanide, calcium carbonate, vitamin d3, ergocalciferol, escitalopram, esomeprazole,  gabapentin enacarbil, gnp milk of magnesia, lubiprostone, magnesium oxide, NON FORMULARY, ondansetron, oxycodone hcl, oxycodone hcl, oxycodone hcl, polycarbophil, polyethylene glycol powder, and gnp best fiber. His primarily concern today is the Back Pain (low) and Knee Pain (bilateral)  Initial Vital Signs:  Pulse/HCG Rate: (!) 106  Temp: 98.9 F (37.2 C) Resp: 20 BP: (!) 150/83 SpO2: 98 %  BMI: Estimated body mass index is 54.39 kg/m as calculated from the following:   Height as of this encounter: _0  (1.803 m).   Weight as of this encounter: 390 lb (176.9 kg).  Risk Assessment: Allergies: Reviewed. He is allergic to tape.  Allergy Precautions: None  required Coagulopathies: Reviewed. None identified.  Blood-thinner therapy: None at this time Active Infection(s): Reviewed. None identified. Mr. Tatar is afebrile  Site Confirmation: Mr. Brookens was asked to confirm the procedure and laterality before marking the site Procedure checklist: Completed Consent: Before the procedure and under the influence of no sedative(s), amnesic(s), or anxiolytics, the patient was informed of the treatment options, risks and possible complications. To fulfill our ethical and legal obligations, as recommended by the American Medical Association's Code of Ethics, I have informed the patient of my clinical impression; the nature and purpose of the treatment or procedure; the risks, benefits, and possible complications of the intervention; the alternatives, including doing nothing; the risk(s) and benefit(s) of the alternative treatment(s) or procedure(s); and the risk(s) and benefit(s) of doing nothing.  Mr. Hoctor was provided with information about the general risks and possible complications associated with most interventional procedures. These include, but are not limited to: failure to achieve desired goals, infection, bleeding, organ or nerve damage, allergic reactions, paralysis, and/or death.  In  addition, he was informed of those risks and possible complications associated to this particular procedure, which include, but are not limited to: damage to the implant; failure to decrease pain; local, systemic, or serious CNS infections, intraspinal abscess with possible cord compression and paralysis, or life-threatening such as meningitis; bleeding; organ damage; nerve injury or damage with subsequent sensory, motor, and/or autonomic system dysfunction, resulting in transient or permanent pain, numbness, and/or weakness of one or several areas of the body; allergic reactions, either minor or major life-threatening, such as anaphylactic or anaphylactoid reactions.  Furthermore, Mr. Kirker was informed of those risks and complications associated with the medications. These include, but are not limited to: allergic reactions (i.e.: anaphylactic or anaphylactoid reactions); endorphine suppression; bradycardia and/or hypotension; water retention and/or peripheral vascular relaxation leading to lower extremity edema and possible stasis ulcers; respiratory depression and/or shortness of breath; decreased metabolic rate leading to weight gain; swelling or edema; medication-induced neural toxicity; particulate matter embolism and blood vessel occlusion with resultant organ, and/or nervous system infarction; and/or intrathecal granuloma formation with possible spinal cord compression and permanent paralysis.  Before refilling the pump Mr. Cobarrubias was informed that some of the medications used in the devise may not be FDA approved for such use and therefore it constitutes an off-label use of the medications.  Finally, he was informed that Medicine is not an exact science; therefore, there is also the possibility of unforeseen or unpredictable risks and/or possible complications that may result in a catastrophic outcome. The patient indicated having understood very clearly. We have given the patient no guarantees  and we have made no promises. Enough time was given to the patient to ask questions, all of which were answered to the patient's satisfaction. Mr. Brix has indicated that he wanted to continue with the procedure. Attestation: I, the ordering provider, attest that I have discussed with the patient the benefits, risks, side-effects, alternatives, likelihood of achieving goals, and potential problems during recovery for the procedure that I have provided informed consent. Date  Time: 12/16/2018 10:59 AM  Pre-Procedure Preparation:  Monitoring: As per clinic protocol. Respiration, ETCO2, SpO2, BP, heart rate and rhythm monitor placed and checked for adequate function Safety Precautions: Patient was assessed for positional comfort and pressure points before starting the procedure. Time-out: I initiated and conducted the "Time-out" before starting the procedure, as per protocol. The patient was asked to participate by confirming the accuracy of the "Time Out" information. Verification of  the correct person, site, and procedure were performed and confirmed by me, the nursing staff, and the patient. "Time-out" conducted as per Joint Commission's Universal Protocol (UP.01.01.01). Time: 1111  Description of Procedure:          Position: Supine Target Area: Central-port of intrathecal pump. Approach: Anterior, 90 degree angle approach. Area Prepped: Entire Area around the pump implant. Prepping solution: DuraPrep (Iodine Povacrylex [0.7% available iodine] and Isopropyl Alcohol, 74% w/w) Safety Precautions: Aspiration looking for blood return was conducted prior to all injections. At no point did we inject any substances, as a needle was being advanced. No attempts were made at seeking any paresthesias. Safe injection practices and needle disposal techniques used. Medications properly checked for expiration dates. SDV (single dose vial) medications used. Description of the Procedure: Protocol guidelines were  followed. Two nurses trained to do implant refills were present during the entire procedure. The refill medication was checked by both healthcare providers as well as the patient. The patient was included in the "Time-out" to verify the medication. The patient was placed in position. The pump was identified. The area was prepped in the usual manner. The sterile template was positioned over the pump, making sure the side-port location matched that of the pump. Both, the pump and the template were held for stability. The needle provided in the Medtronic Kit was then introduced thru the center of the template and into the central port. The pump content was aspirated and discarded volume documented. The new medication was slowly infused into the pump, thru the filter, making sure to avoid overpressure of the device. The needle was then removed and the area cleansed, making sure to leave some of the prepping solution back to take advantage of its long term bactericidal properties. The pump was interrogated and programmed to reflect the correct medication, volume, and dosage. The program was printed and taken to the physician for approval. Once checked and signed by the physician, a copy was provided to the patient and another scanned into the EMR. Vitals:   12/16/18 1058 12/16/18 1059  BP:  (!) 150/83  Pulse:  (!) 106  Resp:  20  Temp:  98.9 F (37.2 C)  SpO2:  98%  Weight: (!) 390 lb (176.9 kg)   Height: _0  (1.803 m)     Start Time: 1111 hrs. End Time: 1125 hrs. Materials & Medications: Medtronic Refill Kit Medication(s): Please see chart orders for details.  Imaging Guidance:          Type of Imaging Technique: None used Indication(s): N/A Exposure Time: No patient exposure Contrast: None used. Fluoroscopic Guidance: N/A Ultrasound Guidance: N/A Interpretation: N/A  Antibiotic Prophylaxis:   Anti-infectives (From admission, onward)   None     Indication(s): None  identified  Post-operative Assessment:  Post-procedure Vital Signs:  Pulse/HCG Rate: (!) 106  Temp: 98.9 F (37.2 C) Resp: 20 BP: (!) 150/83 SpO2: 98 %  EBL: None  Complications: No immediate post-treatment complications observed by team, or reported by patient.  Note: The patient tolerated the entire procedure well. A repeat set of vitals were taken after the procedure and the patient was kept under observation following institutional policy, for this type of procedure. Post-procedural neurological assessment was performed, showing return to baseline, prior to discharge. The patient was provided with post-procedure discharge instructions, including a section on how to identify potential problems. Should any problems arise concerning this procedure, the patient was given instructions to immediately contact us, at any time, without  hesitation. In any case, we plan to contact the patient by telephone for a follow-up status report regarding this interventional procedure.  Comments:  No additional relevant information.  Plan of Care  Orders:  Orders Placed This Encounter  Procedures  . PUMP REFILL    Maintain Protocol by having two(2) healthcare providers during procedure and programming.    Scheduling Instructions:     Please refill intrathecal pump today.    Order Specific Question:   Where will this procedure be performed?    Answer:   [facility]  . PUMP REFILL    Whenever possible schedule on a procedure today.    Standing Status:   Future    Standing Expiration Date:   05/15/2019    Scheduling Instructions:     Please schedule intrathecal pump refill based on pump programming. Avoid schedule intervals of more than 120 days (4 months).    Order Specific Question:   Where will this procedure be performed?    Answer:   [facility]  . Informed Consent Details: Transcribe to consent form and obtain patient signature    Consent Attestation: I, the ordering  provider, attest that I have discussed with the patient the benefits, risks, side-effects, alternatives, likelihood of achieving goals, and potential problems during recovery for the procedure that I have provided informed consent.    Scheduling Instructions:     Procedure: Intrathecal Pump Refill     Attending Physician: Beatriz Chancellor A. Dossie Arbour, MD     Indications: Chronic Pain Syndrome (G89.4)   Chronic Opioid Analgesic:  Oxycodone IR 10 mg 1 tablet p.o. every 6 hours (40 mg/day of oxycodone)+ 44 mcg/hr intrathecal PF-Fentanyl (1,056.1 mcg/day of Fentanyl)(enough oral until 12/27/2018) MME/day:55m/day(from oral medication)+ 105.6 mg/day (from intrathecal medication) = 165.6MME/day(Aprox. 0.98MME/day/kg).    Medications ordered for procedure: No orders of the defined types were placed in this encounter.  Medications administered: JJeneen RinksB. Lang had no medications administered during this visit.  See the medical record for exact dosing, route, and time of administration.  Follow-up plan:   Return for Pump Refill (Max:362mo  Follow-up before 12/27/2018 oral medication management.     Interventional management options: Considering: NOTE: NO RFA until BMI <35  Palliative/therapeuticintrathecal pump refill and maintenance Diagnostic left Genicular nerve block Possible left Genicular nerve RFA Possible bilateral lumbar facet RFA(NO Lumbar RFA until BMI <35) Diagnostic caudal epidural steroid injection+epidurogram Possible Racz procedure Diagnostic bilateral intra-articular shoulder joint injection Diagnostic bilateral suprascapular nerve block Possible bilateral suprascapular nerve RFA Diagnostic interlaminar ESI versus transforaminal ESI   Palliative PRN treatment(s): Palliative/therapeuticintrathecal pump refill and maintenance Diagnostic bilateral lumbar facet block #2     Recent Visits Date Type Provider Dept  11/04/18 Procedure visit NaMilinda PointerMD [facility]  Showing recent visits within past 90 days and meeting all other requirements   Today's Visits Date Type Provider Dept  12/16/18 Procedure visit NaMilinda PointerMD [facility]  Showing today's visits and meeting all other requirements   Future Appointments Date Type Provider Dept  12/27/18 Appointment NaMilinda PointerMD [facility]  02/09/19 Appointment NaMilinda PointerMD [facility]  Showing future appointments within next 90 days and meeting all other requirements   Disposition: Discharge home  Discharge Date & Time: 12/16/2018; 1130 hrs.   Primary Care Physician: SwAntony ContrasMD [facility] Note by: FrGaspar ColaMD Date: 12/16/2018; Time: 11:56 AM  Disclaimer:  Medicine is not an exact science.  The only guarantee in medicine is that nothing is guaranteed. It is important to note that the decision to proceed with this intervention was based on the information collected from the patient. The Data and conclusions were drawn from the patient's questionnaire, the interview, and the physical examination. Because the information was provided in large part by the patient, it cannot be guaranteed that it has not been purposely or unconsciously manipulated. Every effort has been made to obtain as much relevant data as possible for this evaluation. It is important to note that the conclusions that lead to this procedure are derived in large part from the available data. Always take into account that the treatment will also be dependent on availability of resources and existing treatment guidelines, considered by other Pain Management Practitioners as being common knowledge and practice, at the time of the intervention. For Medico-Legal purposes, it is also important to point out that variation in procedural techniques and pharmacological choices are the acceptable norm. The  indications, contraindications, technique, and results of the above procedure should only be interpreted and judged by a Board-Certified Interventional Pain Specialist with extensive familiarity and expertise in the same exact procedure and technique.

## 2018-12-17 ENCOUNTER — Telehealth: Payer: Self-pay

## 2018-12-17 NOTE — Telephone Encounter (Signed)
Post pump refill phone call.  Patient states he is doing good.  

## 2018-12-23 ENCOUNTER — Encounter: Payer: Self-pay | Admitting: Pain Medicine

## 2018-12-23 ENCOUNTER — Other Ambulatory Visit: Payer: Self-pay | Admitting: Pain Medicine

## 2018-12-23 DIAGNOSIS — G894 Chronic pain syndrome: Secondary | ICD-10-CM

## 2018-12-26 NOTE — Progress Notes (Signed)
Pain Management Virtual Encounter Note - Virtual Visit via Telephone Telehealth (real-time audio visits between healthcare provider and patient).   Patient's Phone No. & Preferred Pharmacy:  [phone] (home); [phone] (mobile); (Preferred) [phone] [email or web address]  [facility] - [place], [place] - [address] [place] [address] Phone: [phone] Fax: [phone]    Pre-screening note:  Our staff contacted [name] and offered him an "in person", "face-to-face" appointment versus a telephone encounter. He indicated preferring the telephone encounter, at this time.   Reason for Virtual Visit: COVID-19*  Social distancing based on CDC and AMA recommendations.   I contacted [name] on 12/27/2018 via telephone.      I clearly identified myself as Oswaldo Done, MD. I verified that I was speaking with the correct person using two identifiers (Name: [name], and date of birth: [birth year]).  Advanced Informed Consent I sought verbal advanced consent from [name] for virtual visit interactions. I informed [name] of possible security and privacy concerns, risks, and limitations associated with providing "not-in-person" medical evaluation and management services. I also informed [name] of the availability of "in-person" appointments. Finally, I informed him that there would be a charge for the virtual visit and that he could be  personally, fully or partially, financially responsible for it. [name] expressed understanding and agreed to proceed.   Historic Elements   [name] is a 53 y.o. year old, male patient evaluated today after his last encounter by our practice on 12/23/2018. [name]  has a past medical history of Arthritis, Asthma, Back pain, CHF (congestive heart failure) (HCC), GERD (gastroesophageal reflux disease), Hernia, History of hiatal hernia, Hypogonadism in male,  Neuromuscular disorder (HCC), PONV (postoperative nausea and vomiting), Shortness of breath dyspnea, Sleep apnea, Ulcer, and Wears glasses. He also  has a past surgical history that includes Back surgery; morphine pump; Tonsillectomy; Joint replacement; Shoulder arthroscopy with subacromial decompression, rotator cuff repair and bicep tendon repair (Right, 08/18/2013); Shoulder arthroscopy with subacromial decompression and bicep tendon repair (Left, 10/06/2013); and Laparoscopic gastric sleeve resection (N/A, 01/08/2015). [name] has a current medication list which includes the following prescription(s): acetaminophen, aspirin, bumetanide, calcium carbonate, vitamin d3, ergocalciferol, escitalopram, esomeprazole, gnp milk of magnesia, lubiprostone, magnesium oxide, NON FORMULARY, ondansetron, cyclobenzaprine, oxycodone hcl, oxycodone hcl, oxycodone hcl, pregabalin, pregabalin, pregabalin, and tizanidine. He  reports that he has never smoked. His smokeless tobacco use includes snuff. He reports current alcohol use. He reports that he does not use drugs. [name] is allergic to tape.   HPI  Today, he is being contacted for medication management.  The patient indicates doing well with the current medication regimen. No adverse reactions or side effects reported to the medications.  Unfortunately, for some reason we could not coordinate correctly on his refill date and he ran out of medication before today.  Today we will refill all of his medications but we will make some changes.  The first change will be to discontinue the baclofen which seems not to be providing him with adequate relief of his muscle spasms.  He did try the techniques that I recommended for him to use guarding the use of the magnesium, Gatorade, and the tonic water at night.  He also took the multivitamins, but it did not do the trick.  Because of this we will discontinue the baclofen and we will proceed with a trial of Flexeril 10 mg at  bedtime + tizanidine 4 mg p.o. 3 times daily.  The next change that we will make is that of the gabapentin [facility]) primarily because he feels it is not really contributing that much to his relief.  This is a problem with the gabapentin were it dissociates between the dose and the affect curve.  Typically the long-acting formulation can help, but not in this case.  I believe that this has something to do with the fact that he had that gastric bypass.  In any case today we will go ahead with a Lyrica trial and seem that the patient has 390 pounds, we will start him at 75 mg 3 times daily x2 weeks, after which we will bump that up to 100 mg p.o. 3 times daily for another 2 weeks, and finally we will bring him up to 150 mg p.o. 3 times daily.  450 mg/day is the typical maximum those on a 70 kg individual, but because he is 176 kg, we may later consider to increase it further.  For now we will continue as is with the exception of these changes mentioned above.  The patient was instructed to give [place] a call if he experiences any type of problems with any of these.  Pharmacotherapy Assessment  Analgesic: Oxycodone IR 10 mg, 1 tab POq 6 hrs (40 mg/day of oxycodone)+ 44 mcg/hr intrathecal PF-Fentanyl (1,056.1 mcg/day of Fentanyl) MME/day:60mg /day(from oral medication)+ 105.6 mg/day (from intrathecal medication) = 165.6MME/day(Aprox. 0.98MME/day/kg).   Monitoring: Pharmacotherapy: No side-effects or adverse reactions reported. [place] PMP: PDMP reviewed during this encounter.       Compliance: No problems identified. Effectiveness: Clinically acceptable. Plan: Refer to "POC".  UDS:  Summary  Date Value Ref Range Status  06/30/2018 FINAL  Final    Comment:    ==================================================================== TOXASSURE SELECT 13 (MW) ==================================================================== Test                             Result       Flag       Units Drug Present and  Declared for Prescription Verification   Oxycodone                      955          EXPECTED   ng/mg creat   Oxymorphone                    2975         EXPECTED   ng/mg creat   Noroxycodone                   1684         EXPECTED   ng/mg creat   Noroxymorphone                 596          EXPECTED   ng/mg creat    Sources of oxycodone are scheduled prescription medications.    Oxymorphone, noroxycodone, and noroxymorphone are expected    metabolites of oxycodone. Oxymorphone is also available as a    scheduled prescription medication. Drug Present not Declared for Prescription Verification   Morphine                       141          UNEXPECTED ng/mg creat    Potential sources of morphine include administration  of codeine    or morphine, use of heroin, or ingestion of poppy seeds.   Fentanyl                       6            UNEXPECTED ng/mg creat   Norfentanyl                    48           UNEXPECTED ng/mg creat    Source of fentanyl is a scheduled prescription medication,    including IV, patch, and transmucosal formulations. Norfentanyl    is an expected metabolite of fentanyl. ==================================================================== Test                      Result    Flag   Units      Ref Range   Creatinine              233              mg/dL      >=93 ==================================================================== Declared Medications:  The flagging and interpretation on this report are based on the  following declared medications.  Unexpected results may arise from  inaccuracies in the declared medications.  **Note: The testing scope of this panel includes these medications:  Oxycodone  **Note: The testing scope of this panel does not include following  reported medications:  Potassium  Promethazine  Vitamin B1 (Thiamine) ==================================================================== For clinical consultation, please call [phone]. ====================================================================    Laboratory Chemistry Profile (12 mo)  Renal: 07/15/2018: BUN 14; Creatinine, Ser 0.78  Lab Results  Component Value Date   GFRAA >60 07/15/2018   GFRNONAA >60 07/15/2018   Hepatic: 07/11/2018: Albumin 3.6 Lab Results  Component Value Date   AST 22 07/11/2018   ALT 24 07/11/2018   Other: 06/30/2018: 25-Hydroxy, Vitamin D 6.3; 25-Hydroxy, Vitamin D-2 <1.0; 25-Hydroxy, Vitamin D-3 6.1 07/11/2018: CRP 1.8 Note: Above Lab results reviewed.  Imaging  Last 90 days:  No results found.  Assessment  The primary encounter diagnosis was Chronic pain syndrome. Diagnoses of Chronic low back pain (Primary Area of Pain) (Bilateral) (R>L), Chronic lower extremity pain (Secondary area of Pain) (Bilateral) (L>R), Failed back surgical syndrome, Chronic musculoskeletal pain, Muscle spasticity, Gastroesophageal reflux disease without esophagitis, Chronic nausea s/p bariatric surgery, Therapeutic opioid-induced constipation (OIC), Neurogenic pain, and Vitamin D deficiency were also pertinent to this visit.  Plan of Care  I have discontinued [name]'s polycarbophil, Gabapentin Enacarbil, polyethylene glycol powder, GNP Best Fiber, Oxycodone HCl, Oxycodone HCl, and baclofen. I have also changed his Oxycodone HCl. Additionally, I am having him start on Oxycodone HCl, Oxycodone HCl, tiZANidine, cyclobenzaprine, pregabalin, pregabalin, and pregabalin. Lastly, I am having him maintain his GNP Milk of Magnesia, NON FORMULARY, aspirin, escitalopram, acetaminophen, bumetanide, ergocalciferol, ondansetron, esomeprazole, Magnesium Oxide, lubiprostone, calcium carbonate, and Vitamin D3.  Pharmacotherapy (Medications Ordered): Meds ordered this encounter  Medications  . ondansetron (ZOFRAN-ODT) 8 MG disintegrating tablet    Sig: Take 1 tablet (8 mg total) by mouth every 8 (eight) hours as needed for nausea or vomiting.    Dispense:   270 tablet    Refill:  0    Fill one day early if pharmacy is closed on scheduled refill date. May substitute for generic if available.  . Oxycodone HCl 10 MG TABS    Sig: Take 1 tablet (10 mg total)  by mouth every 6 (six) hours as needed. Must last 30 days    Dispense:  120 tablet    Refill:  0    Chronic Pain: STOP Act (Not applicable) Fill 1 day early if closed on refill date. Do not fill until: 12/27/2018. To last until: 01/26/2019. Avoid benzodiazepines within 8 hours of opioids  . esomeprazole (NEXIUM) 40 MG capsule    Sig: Take 1 capsule (40 mg total) by mouth daily.    Dispense:  90 capsule    Refill:  3    Do not place medication on "Automatic Refill". Fill one day early if pharmacy is closed on scheduled refill date.  . Magnesium Oxide 500 MG CAPS    Sig: Take 1 capsule (500 mg total) by mouth 2 (two) times daily at 8 am and 10 pm.    Dispense:  180 capsule    Refill:  3    Fill one day early if pharmacy is closed on scheduled refill date. May substitute for generic if available.  . lubiprostone (AMITIZA) 24 MCG capsule    Sig: Take 1 capsule (24 mcg total) by mouth 2 (two) times daily with a meal. Swallow the medication whole. Do not break or chew the medication.    Dispense:  60 capsule    Refill:  2    Fill one day early if pharmacy is closed on scheduled refill date. May substitute for generic if available.  . calcium carbonate (CALCIUM 600) 600 MG TABS tablet    Sig: Take 1 tablet (600 mg total) by mouth 2 (two) times daily with a meal.    Dispense:  180 tablet    Refill:  3    May substitute with similar over-the-counter product.  . Cholecalciferol (VITAMIN D3) 125 MCG (5000 UT) CAPS    Sig: Take 1 capsule (5,000 Units total) by mouth daily with breakfast. Take along with calcium and magnesium.    Dispense:  90 capsule    Refill:  3    May substitute with similar over-the-counter product.  . Oxycodone HCl 10 MG TABS    Sig: Take 1 tablet (10 mg total) by mouth every  6 (six) hours as needed. Must last 30 days    Dispense:  120 tablet    Refill:  0    Chronic Pain: STOP Act (Not applicable) Fill 1 day early if closed on refill date. Do not fill until: 01/26/2019. To last until: 02/25/2019. Avoid benzodiazepines within 8 hours of opioids  . Oxycodone HCl 10 MG TABS    Sig: Take 1 tablet (10 mg total) by mouth every 6 (six) hours as needed. Must last 30 days    Dispense:  120 tablet    Refill:  0    Chronic Pain: STOP Act (Not applicable) Fill 1 day early if closed on refill date. Do not fill until: 02/25/2019. To last until: 03/27/2019. Avoid benzodiazepines within 8 hours of opioids  . tiZANidine (ZANAFLEX) 4 MG tablet    Sig: Take 1 tablet (4 mg total) by mouth every 8 (eight) hours as needed for muscle spasms.    Dispense:  90 tablet    Refill:  2    Fill one day early if pharmacy is closed on scheduled refill date. May substitute for generic if available.  . cyclobenzaprine (FLEXERIL) 10 MG tablet    Sig: Take 1 tablet (10 mg total) by mouth at bedtime.    Dispense:  90 tablet    Refill:  0    Fill one day early if pharmacy is closed on scheduled refill date. May substitute for generic if available.  . pregabalin (LYRICA) 75 MG capsule    Sig: Take 1 capsule (75 mg total) by mouth 3 (three) times daily for 14 days.    Dispense:  42 capsule    Refill:  0    Fill one day early if pharmacy is closed on scheduled refill date. May substitute for generic if available.  . pregabalin (LYRICA) 100 MG capsule    Sig: Take 1 capsule (100 mg total) by mouth 3 (three) times daily for 14 days.    Dispense:  42 capsule    Refill:  0    Fill one day early if pharmacy is closed on scheduled refill date. May substitute for generic if available.  . pregabalin (LYRICA) 150 MG capsule    Sig: Take 1 capsule (150 mg total) by mouth 3 (three) times daily.    Dispense:  186 capsule    Refill:  0    Fill one day early if pharmacy is closed on scheduled refill date. May  substitute for generic if available.   Orders:  No orders of the defined types were placed in this encounter.  Follow-up plan:   Return in about 12 weeks (around 03/21/2019) for (VV), E/M, (MM).      Interventional management options: Considering: NOTE: NO RFA until BMI <35  Palliative/therapeuticintrathecal pump refill and maintenance Diagnostic left Genicular NB Possible left Genicular nerve RFA Possible bilateral lumbar facet RFA(NO Lumbar RFA until BMI <35) Diagnostic caudal ESI+epidurogram Possible Racz procedure Diagnostic bilateral IA shoulder joint injection Diagnostic bilateral suprascapular NB Possible bilateral suprascapular nerve RFA Diagnostic LESI vs TFESI   Palliative PRN treatment(s): Palliative/therapeuticintrathecal pump refill and maintenance Diagnostic bilateral lumbar facet block #2    Recent Visits Date Type Provider Dept  12/16/18 Procedure visit Delano MetzNaveira, Ario Mcdiarmid, MD [facility]  11/04/18 Procedure visit Delano MetzNaveira, Sheronda Parran, MD [facility]  Showing recent visits within past 90 days and meeting all other requirements   Today's Visits Date Type Provider Dept  12/27/18 Office Visit Delano MetzNaveira, Adylynn Hertenstein, MD [facility]  Showing today's visits and meeting all other requirements   Future Appointments Date Type Provider Dept  02/09/19 Appointment Delano MetzNaveira, Zaleah Ternes, MD [facility]  Showing future appointments within next 90 days and meeting all other requirements   I discussed the assessment and treatment plan with the patient. The patient was provided an opportunity to ask questions and all were answered. The patient agreed with the plan and demonstrated an understanding of the instructions.  Patient advised to call back or seek an in-person evaluation if the symptoms or condition worsens.  Total duration of non-face-to-face encounter: 17 minutes.  Note by: Oswaldo DoneFrancisco A Charese Abundis, MD Date:  12/27/2018; Time: 5:50 PM  Note: This dictation was prepared with Dragon dictation. Any transcriptional errors that may result from this process are unintentional.  Disclaimer:  * Given the special circumstances of the COVID-19 pandemic, the federal government has announced that the Office for Civil Rights (OCR) will exercise its enforcement discretion and will not impose penalties on physicians using telehealth in the event of noncompliance with regulatory requirements under the [employer] Insurance Portability and Accountability Act (HIPAA) in connection with the good faith provision of telehealth during the COVID-19 national public health emergency. (AMA)

## 2018-12-27 ENCOUNTER — Other Ambulatory Visit: Payer: Self-pay

## 2018-12-27 ENCOUNTER — Ambulatory Visit: Payer: No Typology Code available for payment source | Attending: Pain Medicine | Admitting: Pain Medicine

## 2018-12-27 ENCOUNTER — Other Ambulatory Visit: Payer: Self-pay | Admitting: Pain Medicine

## 2018-12-27 DIAGNOSIS — K219 Gastro-esophageal reflux disease without esophagitis: Secondary | ICD-10-CM

## 2018-12-27 DIAGNOSIS — M961 Postlaminectomy syndrome, not elsewhere classified: Secondary | ICD-10-CM

## 2018-12-27 DIAGNOSIS — M79604 Pain in right leg: Secondary | ICD-10-CM | POA: Diagnosis not present

## 2018-12-27 DIAGNOSIS — M7918 Myalgia, other site: Secondary | ICD-10-CM

## 2018-12-27 DIAGNOSIS — R110 Nausea: Secondary | ICD-10-CM

## 2018-12-27 DIAGNOSIS — T402X5A Adverse effect of other opioids, initial encounter: Secondary | ICD-10-CM

## 2018-12-27 DIAGNOSIS — G894 Chronic pain syndrome: Secondary | ICD-10-CM

## 2018-12-27 DIAGNOSIS — M62838 Other muscle spasm: Secondary | ICD-10-CM

## 2018-12-27 DIAGNOSIS — M79605 Pain in left leg: Secondary | ICD-10-CM

## 2018-12-27 DIAGNOSIS — K5903 Drug induced constipation: Secondary | ICD-10-CM

## 2018-12-27 DIAGNOSIS — M792 Neuralgia and neuritis, unspecified: Secondary | ICD-10-CM

## 2018-12-27 DIAGNOSIS — M545 Low back pain, unspecified: Secondary | ICD-10-CM

## 2018-12-27 DIAGNOSIS — E559 Vitamin D deficiency, unspecified: Secondary | ICD-10-CM

## 2018-12-27 DIAGNOSIS — G8929 Other chronic pain: Secondary | ICD-10-CM

## 2018-12-27 MED ORDER — OXYCODONE HCL 10 MG PO TABS: 10.0000 mg | ORAL_TABLET | Freq: Four times a day (QID) | ORAL | 0 refills | Status: DC | PRN

## 2018-12-27 MED ORDER — CYCLOBENZAPRINE HCL 10 MG PO TABS: 10.0000 mg | ORAL_TABLET | Freq: Every day | ORAL | 0 refills | Status: DC

## 2018-12-27 MED ORDER — PREGABALIN 75 MG PO CAPS: 75.0000 mg | ORAL_CAPSULE | Freq: Three times a day (TID) | ORAL | 0 refills | Status: DC

## 2018-12-27 MED ORDER — PREGABALIN 100 MG PO CAPS: 100.0000 mg | ORAL_CAPSULE | Freq: Three times a day (TID) | ORAL | 0 refills | Status: DC

## 2018-12-27 MED ORDER — ONDANSETRON 8 MG PO TBDP: 8.0000 mg | ORAL_TABLET | Freq: Three times a day (TID) | ORAL | 0 refills | Status: DC | PRN

## 2018-12-27 MED ORDER — CALCIUM CARBONATE 600 MG PO TABS: 600.0000 mg | ORAL_TABLET | Freq: Two times a day (BID) | ORAL | 3 refills | Status: DC

## 2018-12-27 MED ORDER — PREGABALIN 150 MG PO CAPS: 150.0000 mg | ORAL_CAPSULE | Freq: Three times a day (TID) | ORAL | 0 refills | Status: DC

## 2018-12-27 MED ORDER — TIZANIDINE HCL 4 MG PO TABS: 4.0000 mg | ORAL_TABLET | Freq: Three times a day (TID) | ORAL | 2 refills | Status: DC | PRN

## 2018-12-27 MED ORDER — LUBIPROSTONE 24 MCG PO CAPS: 24.0000 ug | ORAL_CAPSULE | Freq: Two times a day (BID) | ORAL | 2 refills | Status: DC

## 2018-12-27 MED ORDER — VITAMIN D3 125 MCG (5000 UT) PO CAPS: 1.0000 | ORAL_CAPSULE | Freq: Every day | ORAL | 3 refills | Status: DC

## 2018-12-27 MED ORDER — ESOMEPRAZOLE MAGNESIUM 40 MG PO CPDR: 40.0000 mg | DELAYED_RELEASE_CAPSULE | Freq: Every day | ORAL | 3 refills | Status: DC

## 2018-12-27 MED ORDER — MAGNESIUM OXIDE -MG SUPPLEMENT 500 MG PO CAPS: 1.0000 | ORAL_CAPSULE | Freq: Two times a day (BID) | ORAL | 3 refills | Status: DC

## 2019-01-10 ENCOUNTER — Telehealth: Payer: Self-pay

## 2019-01-10 NOTE — Telephone Encounter (Signed)
Called and left message on answering machine. Informed patient that Md was OOT and that his medication was ordered for 3 times a day. Instructed to call back if needed.

## 2019-01-10 NOTE — Telephone Encounter (Signed)
He was put on tizanidine 4 mg and it is not helping at all. Can he go up on dosage? He is having a lot of spasms and pain

## 2019-01-11 MED FILL — Medication: INTRATHECAL | Qty: 1 | Status: AC

## 2019-01-18 ENCOUNTER — Telehealth: Payer: Self-pay | Admitting: Pain Medicine

## 2019-01-18 NOTE — Telephone Encounter (Signed)
He said you told him to call if Cyclobenzaprine did not work.

## 2019-01-18 NOTE — Telephone Encounter (Signed)
Muscle relaxers are helping some. But still having a lot of soreness and pain.

## 2019-01-26 ENCOUNTER — Telehealth: Payer: Self-pay | Admitting: *Deleted

## 2019-01-26 ENCOUNTER — Other Ambulatory Visit: Payer: Self-pay

## 2019-01-26 MED ORDER — PAIN MANAGEMENT IT PUMP REFILL: 1.0000 | Freq: Once | INTRATHECAL | 0 refills | Status: AC

## 2019-01-26 NOTE — Telephone Encounter (Signed)
[name] refused an office visit at this time.

## 2019-02-09 ENCOUNTER — Encounter: Payer: Self-pay | Admitting: Pain Medicine

## 2019-02-09 ENCOUNTER — Ambulatory Visit: Payer: No Typology Code available for payment source | Attending: Pain Medicine | Admitting: Pain Medicine

## 2019-02-09 ENCOUNTER — Other Ambulatory Visit: Payer: Self-pay

## 2019-02-09 VITALS — BP 113/82 | HR 100 | Temp 99.1°F | Ht 71.0 in | Wt 380.0 lb

## 2019-02-09 DIAGNOSIS — M79604 Pain in right leg: Secondary | ICD-10-CM | POA: Diagnosis present

## 2019-02-09 DIAGNOSIS — M62838 Other muscle spasm: Secondary | ICD-10-CM | POA: Diagnosis present

## 2019-02-09 DIAGNOSIS — M792 Neuralgia and neuritis, unspecified: Secondary | ICD-10-CM | POA: Diagnosis present

## 2019-02-09 DIAGNOSIS — Z978 Presence of other specified devices: Secondary | ICD-10-CM | POA: Insufficient documentation

## 2019-02-09 DIAGNOSIS — K5903 Drug induced constipation: Secondary | ICD-10-CM | POA: Insufficient documentation

## 2019-02-09 DIAGNOSIS — Z79899 Other long term (current) drug therapy: Secondary | ICD-10-CM | POA: Diagnosis present

## 2019-02-09 DIAGNOSIS — M47816 Spondylosis without myelopathy or radiculopathy, lumbar region: Secondary | ICD-10-CM | POA: Insufficient documentation

## 2019-02-09 DIAGNOSIS — M7918 Myalgia, other site: Secondary | ICD-10-CM | POA: Insufficient documentation

## 2019-02-09 DIAGNOSIS — G894 Chronic pain syndrome: Secondary | ICD-10-CM | POA: Insufficient documentation

## 2019-02-09 DIAGNOSIS — M545 Low back pain: Secondary | ICD-10-CM | POA: Diagnosis present

## 2019-02-09 DIAGNOSIS — M961 Postlaminectomy syndrome, not elsewhere classified: Secondary | ICD-10-CM | POA: Diagnosis present

## 2019-02-09 DIAGNOSIS — Z451 Encounter for adjustment and management of infusion pump: Secondary | ICD-10-CM | POA: Diagnosis present

## 2019-02-09 DIAGNOSIS — G8929 Other chronic pain: Secondary | ICD-10-CM | POA: Diagnosis present

## 2019-02-09 DIAGNOSIS — Z79891 Long term (current) use of opiate analgesic: Secondary | ICD-10-CM | POA: Diagnosis present

## 2019-02-09 DIAGNOSIS — R110 Nausea: Secondary | ICD-10-CM | POA: Diagnosis present

## 2019-02-09 DIAGNOSIS — M79605 Pain in left leg: Secondary | ICD-10-CM | POA: Insufficient documentation

## 2019-02-09 DIAGNOSIS — T402X5A Adverse effect of other opioids, initial encounter: Secondary | ICD-10-CM | POA: Insufficient documentation

## 2019-02-09 MED ORDER — TIZANIDINE HCL 4 MG PO TABS: 4.0000 mg | ORAL_TABLET | Freq: Three times a day (TID) | ORAL | 5 refills | Status: DC | PRN

## 2019-02-09 MED ORDER — ONDANSETRON 8 MG PO TBDP: 8.0000 mg | ORAL_TABLET | Freq: Three times a day (TID) | ORAL | 5 refills | Status: DC | PRN

## 2019-02-09 MED ORDER — PREGABALIN 150 MG PO CAPS: 150.0000 mg | ORAL_CAPSULE | Freq: Three times a day (TID) | ORAL | 5 refills | Status: DC

## 2019-02-09 MED ORDER — OXYCODONE HCL 10 MG PO TABS: 10.0000 mg | ORAL_TABLET | Freq: Four times a day (QID) | ORAL | 0 refills | Status: DC | PRN

## 2019-02-09 MED ORDER — CYCLOBENZAPRINE HCL 10 MG PO TABS: 10.0000 mg | ORAL_TABLET | Freq: Every day | ORAL | 1 refills | Status: DC

## 2019-02-09 MED ORDER — LUBIPROSTONE 24 MCG PO CAPS: 24.0000 ug | ORAL_CAPSULE | Freq: Two times a day (BID) | ORAL | 5 refills | Status: DC

## 2019-02-09 NOTE — Progress Notes (Signed)
Safety precautions to be maintained throughout the outpatient stay will include: orient to surroundings, keep bed in low position, maintain call bell within reach at all times, provide assistance with transfer out of bed and ambulation.  

## 2019-02-09 NOTE — Progress Notes (Signed)
Patient's Name: [name]  MRN: [id number]  Referring Provider: Antony Contras, MD  DOB: 1965/11/28  PCP: Antony Contras, MD  DOS: 02/09/2019  Note by: Gaspar Cola, MD  Service setting: Ambulatory outpatient  Specialty: Interventional Pain Management  Patient type: Established  Location: [facility] (AMB) Pain Management Facility  Visit type: Interventional Procedure   Primary Reason for Visit: Interventional Pain Management Treatment. CC: Back Pain  Procedure:          Intrathecal Drug Delivery System (IDDS):  Type: Reservoir Refill [phone])       Region: Abdominal Laterality:          Type of Pump: Medtronic Synchromed II Delivery Route: Intrathecal Type of Pain Treated: Neuropathic/Nociceptive Primary Medication Class: Opioid/opiate  Medication, Concentration, Infusion Program, & Delivery Rate: Please see scanned programming printout.   Indications: 1. Chronic pain syndrome   2. Failed back surgical syndrome   3. Chronic low back pain (Primary Area of Pain) (Bilateral) (R>L)   4. Chronic lower extremity pain (Secondary area of Pain) (Bilateral) (L>R)   5. Lumbar facet syndrome (Bilateral) (R>L)   6. Long-term use of high-risk medication   7. Long term prescription opiate use   8. Presence of intrathecal pump   9. Encounter for adjustment or management of infusion pump   10. Chronic musculoskeletal pain   11. Muscle spasticity   12. Chronic nausea s/p bariatric surgery   13. Neurogenic pain   14. Therapeutic opioid-induced constipation (OIC)    Pain Assessment: Self-Reported Pain Score: 4 /10             Reported level is compatible with observation.        Pharmacotherapy Assessment  Analgesic: Oxycodone IR 10 mg, 1 tab POq 6 hrs (40 mg/day of oxycodone)+ 44 mcg/hr intrathecal PF-Fentanyl (1,056.1 mcg/day of Fentanyl) MME/day:70m/day(from oral medication)+ 105.6 mg/day (from intrathecal medication) = 165.6MME/day(Aprox. 0.98MME/day/kg).    Monitoring: Pharmacotherapy: No side-effects or adverse reactions reported. [place] PMP: PDMP reviewed during this encounter.       Compliance: No problems identified. Effectiveness: Clinically acceptable. Plan: Refer to "POC".  UDS:  Summary  Date Value Ref Range Status  06/30/2018 FINAL  Final    Comment:    ==================================================================== TOXASSURE SELECT 13 (MW) ==================================================================== Test                             Result       Flag       Units Drug Present and Declared for Prescription Verification   Oxycodone                      955          EXPECTED   ng/mg creat   Oxymorphone                    2975         EXPECTED   ng/mg creat   Noroxycodone                   1684         EXPECTED   ng/mg creat   Noroxymorphone                 596          EXPECTED   ng/mg creat    Sources of oxycodone are scheduled prescription medications.    Oxymorphone, noroxycodone, and noroxymorphone  are expected    metabolites of oxycodone. Oxymorphone is also available as a    scheduled prescription medication. Drug Present not Declared for Prescription Verification   Morphine                       141          UNEXPECTED ng/mg creat    Potential sources of morphine include administration of codeine    or morphine, use of heroin, or ingestion of poppy seeds.   Fentanyl                       6            UNEXPECTED ng/mg creat   Norfentanyl                    48           UNEXPECTED ng/mg creat    Source of fentanyl is a scheduled prescription medication,    including IV, patch, and transmucosal formulations. Norfentanyl    is an expected metabolite of fentanyl. ==================================================================== Test                      Result    Flag   Units      Ref Range   Creatinine              233              mg/dL       >=20 ==================================================================== Declared Medications:  The flagging and interpretation on this report are based on the  following declared medications.  Unexpected results may arise from  inaccuracies in the declared medications.  **Note: The testing scope of this panel includes these medications:  Oxycodone  **Note: The testing scope of this panel does not include following  reported medications:  Potassium  Promethazine  Vitamin B1 (Thiamine) ==================================================================== For clinical consultation, please call [phone]. ====================================================================    Intrathecal Pump Therapy Assessment  Manufacturer: Medtronic Synchromed Type: Programmable Volume: 40 mL reservoir MRI compatibility: Yes   Drug content:  Primary Medication Class: Opioid Primary Medication:PF-Fentanyl(2,000.0 mcg/ml) Secondary Medication:PF-Bupivacaine(43m/mL) Other Medication: None   Programming:  Type: Simple continuous. See pump readout for details.   Changes:  Medication Change: None at this point Rate Change: No change in rate  Reported side-effects or adverse reactions: None reported  Effectiveness: Described as relatively effective, allowing for increase in activities of daily living (ADL) Clinically meaningful improvement in function (CMIF): Sustained CMIF goals met  Plan: Pump refill today  Pre-op Assessment:  Mr. HBlackburnis a 53y.o. (year old), male patient, seen today for interventional treatment. He  has a past surgical history that includes Back surgery; morphine pump; Tonsillectomy; Joint replacement; Shoulder arthroscopy with subacromial decompression, rotator cuff repair and bicep tendon repair (Right, 08/18/2013); Shoulder arthroscopy with subacromial decompression and bicep tendon repair (Left, 10/06/2013); and Laparoscopic gastric sleeve resection (N/A,  01/08/2015). Mr. HGeiselhas a current medication list which includes the following prescription(s): acetaminophen, aspirin, bumetanide, calcium carbonate, vitamin d3, ergocalciferol, escitalopram, esomeprazole, gnp milk of magnesia, magnesium oxide, NON FORMULARY, oxycodone hcl, cyclobenzaprine, lubiprostone, ondansetron, oxycodone hcl, oxycodone hcl, pregabalin, and tizanidine. His primarily concern today is the Back Pain  Initial Vital Signs:  Pulse/HCG Rate: 100  Temp: 99.1 F (37.3 C) Resp:   BP: 113/82 SpO2: 99 %  BMI: Estimated body mass index is 53 kg/m as  calculated from the following:   Height as of this encounter: '5\' 11"'  (1.803 m).   Weight as of this encounter: 380 lb (172.4 kg).  Risk Assessment: Allergies: Reviewed. He is allergic to tape.  Allergy Precautions: None required Coagulopathies: Reviewed. None identified.  Blood-thinner therapy: None at this time Active Infection(s): Reviewed. None identified. Mr. Borba is afebrile  Site Confirmation: Mr. Raczka was asked to confirm the procedure and laterality before marking the site Procedure checklist: Completed Consent: Before the procedure and under the influence of no sedative(s), amnesic(s), or anxiolytics, the patient was informed of the treatment options, risks and possible complications. To fulfill our ethical and legal obligations, as recommended by the American Medical Association's Code of Ethics, I have informed the patient of my clinical impression; the nature and purpose of the treatment or procedure; the risks, benefits, and possible complications of the intervention; the alternatives, including doing nothing; the risk(s) and benefit(s) of the alternative treatment(s) or procedure(s); and the risk(s) and benefit(s) of doing nothing.  Mr. Stellmach was provided with information about the general risks and possible complications associated with most interventional procedures. These include, but are not limited to:  failure to achieve desired goals, infection, bleeding, organ or nerve damage, allergic reactions, paralysis, and/or death.  In addition, he was informed of those risks and possible complications associated to this particular procedure, which include, but are not limited to: damage to the implant; failure to decrease pain; local, systemic, or serious CNS infections, intraspinal abscess with possible cord compression and paralysis, or life-threatening such as meningitis; bleeding; organ damage; nerve injury or damage with subsequent sensory, motor, and/or autonomic system dysfunction, resulting in transient or permanent pain, numbness, and/or weakness of one or several areas of the body; allergic reactions, either minor or major life-threatening, such as anaphylactic or anaphylactoid reactions.  Furthermore, Mr. Fearnow was informed of those risks and complications associated with the medications. These include, but are not limited to: allergic reactions (i.e.: anaphylactic or anaphylactoid reactions); endorphine suppression; bradycardia and/or hypotension; water retention and/or peripheral vascular relaxation leading to lower extremity edema and possible stasis ulcers; respiratory depression and/or shortness of breath; decreased metabolic rate leading to weight gain; swelling or edema; medication-induced neural toxicity; particulate matter embolism and blood vessel occlusion with resultant organ, and/or nervous system infarction; and/or intrathecal granuloma formation with possible spinal cord compression and permanent paralysis.  Before refilling the pump Mr. Cheever was informed that some of the medications used in the devise may not be FDA approved for such use and therefore it constitutes an off-label use of the medications.  Finally, he was informed that Medicine is not an exact science; therefore, there is also the possibility of unforeseen or unpredictable risks and/or possible complications that may  result in a catastrophic outcome. The patient indicated having understood very clearly. We have given the patient no guarantees and we have made no promises. Enough time was given to the patient to ask questions, all of which were answered to the patient's satisfaction. Mr. Stefanski has indicated that he wanted to continue with the procedure. Attestation: I, the ordering provider, attest that I have discussed with the patient the benefits, risks, side-effects, alternatives, likelihood of achieving goals, and potential problems during recovery for the procedure that I have provided informed consent. Date  Time: 02/09/2019 12:50 PM  Pre-Procedure Preparation:  Monitoring: As per clinic protocol. Respiration, ETCO2, SpO2, BP, heart rate and rhythm monitor placed and checked for adequate function Safety Precautions: Patient was assessed for  positional comfort and pressure points before starting the procedure. Time-out: I initiated and conducted the "Time-out" before starting the procedure, as per protocol. The patient was asked to participate by confirming the accuracy of the "Time Out" information. Verification of the correct person, site, and procedure were performed and confirmed by me, the nursing staff, and the patient. "Time-out" conducted as per Joint Commission's Universal Protocol (UP.01.01.01). Time: 1313  Description of Procedure:          Position: Supine Target Area: Central-port of intrathecal pump. Approach: Anterior, 90 degree angle approach. Area Prepped: Entire Area around the pump implant. Prepping solution: DuraPrep (Iodine Povacrylex [0.7% available iodine] and Isopropyl Alcohol, 74% w/w) Safety Precautions: Aspiration looking for blood return was conducted prior to all injections. At no point did we inject any substances, as a needle was being advanced. No attempts were made at seeking any paresthesias. Safe injection practices and needle disposal techniques used. Medications  properly checked for expiration dates. SDV (single dose vial) medications used. Description of the Procedure: Protocol guidelines were followed. Two nurses trained to do implant refills were present during the entire procedure. The refill medication was checked by both healthcare providers as well as the patient. The patient was included in the "Time-out" to verify the medication. The patient was placed in position. The pump was identified. The area was prepped in the usual manner. The sterile template was positioned over the pump, making sure the side-port location matched that of the pump. Both, the pump and the template were held for stability. The needle provided in the Medtronic Kit was then introduced thru the center of the template and into the central port. The pump content was aspirated and discarded volume documented. The new medication was slowly infused into the pump, thru the filter, making sure to avoid overpressure of the device. The needle was then removed and the area cleansed, making sure to leave some of the prepping solution back to take advantage of its long term bactericidal properties. The pump was interrogated and programmed to reflect the correct medication, volume, and dosage. The program was printed and taken to the physician for approval. Once checked and signed by the physician, a copy was provided to the patient and another scanned into the EMR. Vitals:   02/09/19 1247  BP: 113/82  Pulse: 100  Temp: 99.1 F (37.3 C)  SpO2: 99%  Weight: (!) 380 lb (172.4 kg)  Height: '5\' 11"'  (1.803 m)    Start Time: 1313 hrs. End Time:   hrs. Materials & Medications: Medtronic Refill Kit Medication(s): Please see chart orders for details.  Imaging Guidance:          Type of Imaging Technique: None used Indication(s): N/A Exposure Time: No patient exposure Contrast: None used. Fluoroscopic Guidance: N/A Ultrasound Guidance: N/A Interpretation: N/A  Antibiotic Prophylaxis:    Anti-infectives (From admission, onward)   None     Indication(s): None identified  Post-operative Assessment:  Post-procedure Vital Signs:  Pulse/HCG Rate: 100  Temp: 99.1 F (37.3 C) Resp:   BP: 113/82 SpO2: 99 %  EBL: None  Complications: No immediate post-treatment complications observed by team, or reported by patient.  Note: The patient tolerated the entire procedure well. A repeat set of vitals were taken after the procedure and the patient was kept under observation following institutional policy, for this type of procedure. Post-procedural neurological assessment was performed, showing return to baseline, prior to discharge. The patient was provided with post-procedure discharge instructions, including a section  on how to identify potential problems. Should any problems arise concerning this procedure, the patient was given instructions to immediately contact us, at any time, without hesitation. In any case, we plan to contact the patient by telephone for a follow-up status report regarding this interventional procedure.  Comments:  No additional relevant information.  Plan of Care  Orders:  Orders Placed This Encounter  Procedures  . PUMP REFILL    Maintain Protocol by having two(2) healthcare providers during procedure and programming.    Scheduling Instructions:     Please refill intrathecal pump today.    Order Specific Question:   Where will this procedure be performed?    Answer:   [facility]  . PUMP REFILL    Whenever possible schedule on a procedure today.    Standing Status:   Future    Standing Expiration Date:   07/09/2019    Scheduling Instructions:     Please schedule intrathecal pump refill based on pump programming. Avoid schedule intervals of more than 120 days (4 months).    Order Specific Question:   Where will this procedure be performed?    Answer:   [facility]  . Informed Consent Details: Physician/Practitioner Attestation;  Transcribe to consent form and obtain patient signature    Provider Attestation: I, Beach Dossie Arbour, MD, (Pain Management Specialist), the physician/practitioner, attest that I have discussed with the patient the benefits, risks, side effects, alternatives, likelihood of achieving goals and potential problems during recovery for the procedure that I have provided informed consent.    Scheduling Instructions:     Procedure: Intrathecal Pump Refill     Attending Physician: Beatriz Chancellor A. Dossie Arbour, MD     Indications: Chronic Pain Syndrome (G89.4)     Transcribe to consent form and obtain patient signature.   Chronic Opioid Analgesic:  Oxycodone IR 10 mg, 1 tab POq 6 hrs (40 mg/day of oxycodone)+ 44 mcg/hr intrathecal PF-Fentanyl (1,056.1 mcg/day of Fentanyl) MME/day:80m/day(from oral medication)+ 105.6 mg/day (from intrathecal medication) = 165.6MME/day(Aprox. 0.98MME/day/kg).   Medications ordered for procedure: Meds ordered this encounter  Medications  . tiZANidine (ZANAFLEX) 4 MG tablet    Sig: Take 1 tablet (4 mg total) by mouth every 8 (eight) hours as needed for muscle spasms.    Dispense:  90 tablet    Refill:  5    Fill one day early if pharmacy is closed on scheduled refill date. May substitute for generic if available.  . cyclobenzaprine (FLEXERIL) 10 MG tablet    Sig: Take 1 tablet (10 mg total) by mouth at bedtime.    Dispense:  90 tablet    Refill:  1    Fill one day early if pharmacy is closed on scheduled refill date. May substitute for generic if available.  . ondansetron (ZOFRAN-ODT) 8 MG disintegrating tablet    Sig: Take 1 tablet (8 mg total) by mouth every 8 (eight) hours as needed for nausea or vomiting.    Dispense:  90 tablet    Refill:  5    Fill one day early if pharmacy is closed on scheduled refill date. May substitute for generic if available.  . Oxycodone HCl 10 MG TABS    Sig: Take 1 tablet (10 mg total) by mouth every 6 (six) hours as needed. Must  last 30 days    Dispense:  120 tablet    Refill:  0    Chronic Pain: STOP Act (Not applicable) Fill 1 day early if closed  on refill date. Do not fill until: 03/27/2019. To last until: 04/26/2019. Avoid benzodiazepines within 8 hours of opioids  . pregabalin (LYRICA) 150 MG capsule    Sig: Take 1 capsule (150 mg total) by mouth 3 (three) times daily.    Dispense:  90 capsule    Refill:  5    Fill one day early if pharmacy is closed on scheduled refill date. May substitute for generic if available.  . lubiprostone (AMITIZA) 24 MCG capsule    Sig: Take 1 capsule (24 mcg total) by mouth 2 (two) times daily with a meal. Swallow the medication whole. Do not break or chew the medication.    Dispense:  60 capsule    Refill:  5    Fill one day early if pharmacy is closed on scheduled refill date. May substitute for generic if available.  . Oxycodone HCl 10 MG TABS    Sig: Take 1 tablet (10 mg total) by mouth every 6 (six) hours as needed. Must last 30 days    Dispense:  120 tablet    Refill:  0    Chronic Pain: STOP Act (Not applicable) Fill 1 day early if closed on refill date. Do not fill until: 04/26/2019. To last until: 05/26/2019. Avoid benzodiazepines within 8 hours of opioids   Medications administered: Jeneen Rinks B. Corlew had no medications administered during this visit.  See the medical record for exact dosing, route, and time of administration.  Follow-up plan:   Return in about 15 weeks (around 05/25/2019) for Pump Refill (Max:51mo, in addition, (VV), (MM) on 05/25/2019.       Interventional management options: Considering: NOTE: NO RFA until BMI <35  Palliative/therapeuticintrathecal pump refill and maintenance Diagnostic left Genicular NB Possible left Genicular nerve RFA Possible bilateral lumbar facet RFA(NO Lumbar RFA until BMI <35) Diagnostic caudal ESI+epidurogram Possible Racz procedure Diagnostic bilateral IA shoulder joint injection Diagnostic bilateral  suprascapular NB Possible bilateral suprascapular nerve RFA Diagnostic LESI vs TFESI   Palliative PRN treatment(s): Palliative/therapeuticintrathecal pump refill and maintenance Diagnostic bilateral lumbar facet block #2     Recent Visits Date Type Provider Dept  12/27/18 Office Visit NMilinda Pointer MD [facility]  12/16/18 Procedure visit NMilinda Pointer MD [facility]  Showing recent visits within past 90 days and meeting all other requirements   Today's Visits Date Type Provider Dept  02/09/19 Procedure visit NMilinda Pointer MD [facility]  Showing today's visits and meeting all other requirements   Future Appointments Date Type Provider Dept  03/14/19 Appointment NMilinda Pointer MD [facility]  04/05/19 Appointment NMilinda Pointer MD [facility]  Showing future appointments within next 90 days and meeting all other requirements   Disposition: Discharge home  Discharge Date & Time: 02/09/2019; 1343 hrs.   Primary Care Physician: [facility]y Contras [facility] Note by: FGaspar Cola MD Date: 02/09/2019; Time: 2:10 PM  Disclaimer:  Medicine is not an eChief Strategy Officer The only guarantee in medicine is that nothing is guaranteed. It is important to note that the decision to proceed with this intervention was based on the information collected from the patient. The Data and conclusions were drawn from the patient's questionnaire, the interview, and the physical examination. Because the information was provided in large part by the patient, it cannot be guaranteed that it has not been purposely or unconsciously manipulated. Every effort has been made to obtain as much relevant data as possible for this evaluation. It  is important to note that the conclusions that lead to this procedure are derived in large part from the available data. Always take into account that  the treatment will also be dependent on availability of resources and existing treatment guidelines, considered by other Pain Management Practitioners as being common knowledge and practice, at the time of the intervention. For Medico-Legal purposes, it is also important to point out that variation in procedural techniques and pharmacological choices are the acceptable norm. The indications, contraindications, technique, and results of the above procedure should only be interpreted and judged by a Board-Certified Interventional Pain Specialist with extensive familiarity and expertise in the same exact procedure and technique.

## 2019-03-07 ENCOUNTER — Other Ambulatory Visit: Payer: Self-pay

## 2019-03-07 MED ORDER — PAIN MANAGEMENT IT PUMP REFILL: 1.0000 | Freq: Once | INTRATHECAL | 0 refills | Status: AC

## 2019-03-14 ENCOUNTER — Ambulatory Visit: Payer: Medicare Other | Admitting: Pain Medicine

## 2019-04-05 ENCOUNTER — Encounter: Payer: Self-pay | Admitting: Pain Medicine

## 2019-04-05 ENCOUNTER — Other Ambulatory Visit: Payer: Self-pay

## 2019-04-05 ENCOUNTER — Ambulatory Visit: Payer: No Typology Code available for payment source | Attending: Pain Medicine | Admitting: Pain Medicine

## 2019-04-05 VITALS — BP 150/88 | HR 94 | Temp 98.9°F | Resp 20 | Ht 71.0 in | Wt 375.0 lb

## 2019-04-05 DIAGNOSIS — Z451 Encounter for adjustment and management of infusion pump: Secondary | ICD-10-CM | POA: Diagnosis present

## 2019-04-05 DIAGNOSIS — M545 Low back pain, unspecified: Secondary | ICD-10-CM

## 2019-04-05 DIAGNOSIS — Z978 Presence of other specified devices: Secondary | ICD-10-CM | POA: Diagnosis present

## 2019-04-05 DIAGNOSIS — M79604 Pain in right leg: Secondary | ICD-10-CM | POA: Insufficient documentation

## 2019-04-05 DIAGNOSIS — Z79899 Other long term (current) drug therapy: Secondary | ICD-10-CM | POA: Diagnosis present

## 2019-04-05 DIAGNOSIS — G8929 Other chronic pain: Secondary | ICD-10-CM

## 2019-04-05 DIAGNOSIS — G894 Chronic pain syndrome: Secondary | ICD-10-CM | POA: Diagnosis not present

## 2019-04-05 DIAGNOSIS — Z79891 Long term (current) use of opiate analgesic: Secondary | ICD-10-CM

## 2019-04-05 DIAGNOSIS — M79605 Pain in left leg: Secondary | ICD-10-CM | POA: Diagnosis present

## 2019-04-05 DIAGNOSIS — M47816 Spondylosis without myelopathy or radiculopathy, lumbar region: Secondary | ICD-10-CM | POA: Diagnosis present

## 2019-04-05 DIAGNOSIS — M961 Postlaminectomy syndrome, not elsewhere classified: Secondary | ICD-10-CM

## 2019-04-05 MED ORDER — OXYCODONE HCL 10 MG PO TABS: 10.0000 mg | ORAL_TABLET | Freq: Four times a day (QID) | ORAL | 0 refills | Status: DC | PRN

## 2019-04-05 NOTE — Progress Notes (Signed)
Safety precautions to be maintained throughout the outpatient stay will include: orient to surroundings, keep bed in low position, maintain call bell within reach at all times, provide assistance with transfer out of bed and ambulation.  

## 2019-04-05 NOTE — Progress Notes (Signed)
PROVIDER NOTE: Information contained herein reflects review and annotations entered in association with encounter. Patient information is provided elsewhere in the medical record. Interpretation of information and data should be left to medically trained personnel. Document created using STT technology, any transcriptional errors that may result from process are unintentional. Patient's Name: [name]  MRN: [id number]  Referring Provider: Antony Contras, MD  DOB: 1965-10-27  PCP: Brian Contras, MD  DOS: 04/05/2019  Note by: Brian Cola, MD  Service setting: Ambulatory outpatient  Specialty: Interventional Pain Management  Patient type: Established  Location: [facility] (AMB) Pain Management Facility  Visit type: Interventional Procedure   Primary Reason for Visit: Interventional Pain Management Treatment. CC: Back Pain (low)  Procedure:          Intrathecal Drug Delivery System (IDDS):  Type: Reservoir Refill [phone]) No rate change Region: Abdominal Laterality: Left  Type of Pump: Medtronic Synchromed II Delivery Route: Intrathecal Type of Pain Treated: Neuropathic/Nociceptive Primary Medication Class: Opioid/opiate  Medication, Concentration, Infusion Program, & Delivery Rate: Please see scanned programming printout.   Indications: 1. Chronic pain syndrome   2. Failed back surgical syndrome   3. Chronic low back pain (Primary Area of Pain) (Bilateral) (R>L)   4. Chronic lower extremity pain (Secondary area of Pain) (Bilateral) (L>R)   5. Lumbar facet syndrome (Bilateral) (R>L)   6. Long-term use of high-risk medication   7. Long term prescription opiate use   8. Presence of intrathecal pump   9. Encounter for adjustment or management of infusion pump    Pain Assessment: Self-Reported Pain Score: 5 /10             Reported level is compatible with observation.        Pharmacotherapy Assessment  Analgesic: [name] IR 10 mg, 1 tab PO q 6 hrs (40 mg/day of [name])+ 52.9  mcg/hr intrathecal PF-Fentanyl (1,268.8 mcg/day of Fentanyl) MME/day:32m/day(from oral medication)+ 126.96 mg/day (from intrathecal fentanyl) = 186.96MME/day(Aprox. 1.08MME/day/kg).   Monitoring: Pharmacotherapy: No side-effects or adverse reactions reported. [place] PMP: PDMP reviewed during this encounter.       Compliance: No problems identified. Effectiveness: Clinically acceptable. Plan: Refer to "POC".  UDS:  Summary  Date Value Ref Range Status  06/30/2018 FINAL  Final    Comment:    ==================================================================== TOXASSURE SELECT 13 (MW) ==================================================================== Test                             Result       Flag       Units Drug Present and Declared for Prescription Verification   [name]                      955          EXPECTED   ng/mg creat   Oxymorphone                    2975         EXPECTED   ng/mg creat   Noroxycodone                   1684         EXPECTED   ng/mg creat   Noroxymorphone                 596          EXPECTED   ng/mg creat    Sources of [name]  are scheduled prescription medications.    Oxymorphone, noroxycodone, and noroxymorphone are expected    metabolites of [name]. Oxymorphone is also available as a    scheduled prescription medication. Drug Present not Declared for Prescription Verification   Morphine                       141          UNEXPECTED ng/mg creat    Potential sources of morphine include administration of codeine    or morphine, use of heroin, or ingestion of poppy seeds.   Fentanyl                       6            UNEXPECTED ng/mg creat   Norfentanyl                    48           UNEXPECTED ng/mg creat    Source of fentanyl is a scheduled prescription medication,    including IV, patch, and transmucosal formulations. Norfentanyl    is an expected metabolite of  fentanyl. ==================================================================== Test                      Result    Flag   Units      Ref Range   Creatinine              233              mg/dL      >=20 ==================================================================== Declared Medications:  The flagging and interpretation on this report are based on the  following declared medications.  Unexpected results may arise from  inaccuracies in the declared medications.  **Note: The testing scope of this panel includes these medications:  [name]  **Note: The testing scope of this panel does not include following  reported medications:  Potassium  Promethazine  Vitamin B1 (Thiamine) ==================================================================== For clinical consultation, please call [phone]. ====================================================================    Intrathecal Pump Therapy Assessment  Manufacturer: Medtronic Synchromed Type: Programmable Volume: 40 mL reservoir MRI compatibility: Yes   Drug content:  Primary Medication Class:Opioid Primary Medication:PF-Fentanyl(2,000.0 mcg/ml) Secondary Medication:PF-Bupivacaine(20.8m/mL) Other Medication:None   Programming:  Type: Simple continuous. See pump readout for details.   Changes:  Medication Change: None at this point Rate Change: No change in rate  Reported side-effects or adverse reactions: None reported  Effectiveness: Described as relatively effective, allowing for increase in activities of daily living (ADL) Clinically meaningful improvement in function (CMIF): Sustained CMIF goals met  Plan: Pump refill today  Pre-op Assessment:  [name] a 53y.o. (year old), male patient, seen today for interventional treatment. He  has a past surgical history that includes Back surgery; morphine pump; Tonsillectomy; Joint replacement; Shoulder arthroscopy with subacromial decompression,  rotator cuff repair and bicep tendon repair (Right, 08/18/2013); Shoulder arthroscopy with subacromial decompression and bicep tendon repair (Left, 10/06/2013); and Laparoscopic gastric sleeve resection (N/A, 01/08/2015). [name] a current medication list which includes the following prescription(s): acetaminophen, aspirin, bumetanide, calcium carbonate, vitamin d3, [START ON 03/26/2020] cyclobenzaprine, escitalopram, esomeprazole, gnp milk of magnesia, lubiprostone, magnesium oxide, NON FORMULARY, [START ON 03/26/2020] ondansetron, [START ON 04/26/2019] [name] hcl, [START ON 05/26/2019] [name] hcl, [START ON 06/25/2019] [name] hcl, [START ON 03/26/2020] pregabalin, [START ON 03/26/2020] tizanidine, and ergocalciferol. His primarily concern today is the Back Pain (low)  Initial Vital Signs:  Pulse/HCG Rate: 94  Temp: 98.9 F (37.2 C) Resp: 20 BP: (!) 150/88 SpO2: 97 %  BMI: Estimated body mass index is 52.3 kg/m as calculated from the following:   Height as of this encounter: _0  (1.803 m).   Weight as of this encounter: 375 lb (170.1 kg).  Risk Assessment: Allergies: Reviewed. He is allergic to tape.  Allergy Precautions: None required Coagulopathies: Reviewed. None identified.  Blood-thinner therapy: None at this time Active Infection(s): Reviewed. None identified. Mr. Wherley is afebrile  Site Confirmation: Mr. Swaim was asked to confirm the procedure and laterality before marking the site Procedure checklist: Completed Consent: Before the procedure and under the influence of no sedative(s), amnesic(s), or anxiolytics, the patient was informed of the treatment options, risks and possible complications. To fulfill our ethical and legal obligations, as recommended by the American Medical Association's Code of Ethics, I have informed the patient of my clinical impression; the nature and purpose of the treatment or procedure; the risks, benefits, and possible complications of  the intervention; the alternatives, including doing nothing; the risk(s) and benefit(s) of the alternative treatment(s) or procedure(s); and the risk(s) and benefit(s) of doing nothing.  Mr. Lindstrom was provided with information about the general risks and possible complications associated with most interventional procedures. These include, but are not limited to: failure to achieve desired goals, infection, bleeding, organ or nerve damage, allergic reactions, paralysis, and/or death.  In addition, he was informed of those risks and possible complications associated to this particular procedure, which include, but are not limited to: damage to the implant; failure to decrease pain; local, systemic, or serious CNS infections, intraspinal abscess with possible cord compression and paralysis, or life-threatening such as meningitis; bleeding; organ damage; nerve injury or damage with subsequent sensory, motor, and/or autonomic system dysfunction, resulting in transient or permanent pain, numbness, and/or weakness of one or several areas of the body; allergic reactions, either minor or major life-threatening, such as anaphylactic or anaphylactoid reactions.  Furthermore, Mr. Robling was informed of those risks and complications associated with the medications. These include, but are not limited to: allergic reactions (i.e.: anaphylactic or anaphylactoid reactions); endorphine suppression; bradycardia and/or hypotension; water retention and/or peripheral vascular relaxation leading to lower extremity edema and possible stasis ulcers; respiratory depression and/or shortness of breath; decreased metabolic rate leading to weight gain; swelling or edema; medication-induced neural toxicity; particulate matter embolism and blood vessel occlusion with resultant organ, and/or nervous system infarction; and/or intrathecal granuloma formation with possible spinal cord compression and permanent paralysis.  Before refilling  the pump Mr. Treiber was informed that some of the medications used in the devise may not be FDA approved for such use and therefore it constitutes an off-label use of the medications.  Finally, he was informed that Medicine is not an exact science; therefore, there is also the possibility of unforeseen or unpredictable risks and/or possible complications that may result in a catastrophic outcome. The patient indicated having understood very clearly. We have given the patient no guarantees and we have made no promises. Enough time was given to the patient to ask questions, all of which were answered to the patient's satisfaction. Mr. Schwenke has indicated that he wanted to continue with the procedure. Attestation: I, the ordering provider, attest that I have discussed with the patient the benefits, risks, side-effects, alternatives, likelihood of achieving goals, and potential problems during recovery for the procedure that I have provided informed consent. Date  Time: 04/05/2019 10:58  AM  Pre-Procedure Preparation:  Monitoring: As per clinic protocol. Respiration, ETCO2, SpO2, BP, heart rate and rhythm monitor placed and checked for adequate function Safety Precautions: Patient was assessed for positional comfort and pressure points before starting the procedure. Time-out: I initiated and conducted the "Time-out" before starting the procedure, as per protocol. The patient was asked to participate by confirming the accuracy of the "Time Out" information. Verification of the correct person, site, and procedure were performed and confirmed by me, the nursing staff, and the patient. "Time-out" conducted as per Joint Commission's Universal Protocol (UP.01.01.01). Time: 1104  Description of Procedure:          Position: Supine Target Area: Central-port of intrathecal pump. Approach: Anterior, 90 degree angle approach. Area Prepped: Entire Area around the pump implant. Prepping solution: DuraPrep (Iodine  Povacrylex [0.7% available iodine] and Isopropyl Alcohol, 74% w/w) Safety Precautions: Aspiration looking for blood return was conducted prior to all injections. At no point did we inject any substances, as a needle was being advanced. No attempts were made at seeking any paresthesias. Safe injection practices and needle disposal techniques used. Medications properly checked for expiration dates. SDV (single dose vial) medications used. Description of the Procedure: Protocol guidelines were followed. Two nurses trained to do implant refills were present during the entire procedure. The refill medication was checked by both healthcare providers as well as the patient. The patient was included in the "Time-out" to verify the medication. The patient was placed in position. The pump was identified. The area was prepped in the usual manner. The sterile template was positioned over the pump, making sure the side-port location matched that of the pump. Both, the pump and the template were held for stability. The needle provided in the Medtronic Kit was then introduced thru the center of the template and into the central port. The pump content was aspirated and discarded volume documented. The new medication was slowly infused into the pump, thru the filter, making sure to avoid overpressure of the device. The needle was then removed and the area cleansed, making sure to leave some of the prepping solution back to take advantage of its long term bactericidal properties. The pump was interrogated and programmed to reflect the correct medication, volume, and dosage. The program was printed and taken to the physician for approval. Once checked and signed by the physician, a copy was provided to the patient and another scanned into the EMR. Vitals:   04/05/19 1058 04/05/19 1059  BP:  (!) 150/88  Pulse:  94  Resp:  20  Temp:  98.9 F (37.2 C)  SpO2:  97%  Weight: (!) 375 lb (170.1 kg)   Height: '5\' 11"'$  (1.803 m)      Start Time: 1104 hrs. End Time: 1118 hrs. Materials & Medications: Medtronic Refill Kit Medication(s): Please see chart orders for details.  Imaging Guidance:          Type of Imaging Technique: None used Indication(s): N/A Exposure Time: No patient exposure Contrast: None used. Fluoroscopic Guidance: N/A Ultrasound Guidance: N/A Interpretation: N/A  Antibiotic Prophylaxis:   Anti-infectives (From admission, onward)   None     Indication(s): None identified  Post-operative Assessment:  Post-procedure Vital Signs:  Pulse/HCG Rate: 94  Temp: 98.9 F (37.2 C) Resp: 20 BP: (!) 150/88 SpO2: 97 %  EBL: None  Complications: No immediate post-treatment complications observed by team, or reported by patient.  Note: The patient tolerated the entire procedure well. A repeat set of vitals  were taken after the procedure and the patient was kept under observation following institutional policy, for this type of procedure. Post-procedural neurological assessment was performed, showing return to baseline, prior to discharge. The patient was provided with post-procedure discharge instructions, including a section on how to identify potential problems. Should any problems arise concerning this procedure, the patient was given instructions to immediately contact us, at any time, without hesitation. In any case, we plan to contact the patient by telephone for a follow-up status report regarding this interventional procedure.  Comments:  No additional relevant information.  Plan of Care  Orders:  Orders Placed This Encounter  Procedures  . Pump Refill (Today)    Maintain Protocol by having two(2) healthcare providers during procedure and programming.    Scheduling Instructions:     Please refill intrathecal pump today.    Order Specific Question:   Where will this procedure be performed?    Answer:   [facility]  . Pump Refill (Schedule Return)    Whenever possible schedule on  a procedure today.    Standing Status:   Future    Standing Expiration Date:   09/02/2019    Scheduling Instructions:     Please schedule intrathecal pump refill based on pump programming. Avoid schedule intervals of more than 120 days (4 months).    Order Specific Question:   Where will this procedure be performed?    Answer:   [facility]  . Consent: Pump Refill    Provider Attestation: I, [place] Dossie Arbour, MD, (Pain Management Specialist), the physician/practitioner, attest that I have discussed with the patient the benefits, risks, side effects, alternatives, likelihood of achieving goals and potential problems during recovery for the procedure that I have provided informed consent.    Scheduling Instructions:     Procedure: Intrathecal Pump Refill     Attending Physician: Beatriz Chancellor A. Dossie Arbour, MD     Indications: Chronic Pain Syndrome (G89.4)     Transcribe to consent form and obtain patient signature.   Chronic Opioid Analgesic:   [name] IR 10 mg, 1 tab PO q 6 hrs (40 mg/day of [name])+ 52.9 mcg/hr intrathecal PF-Fentanyl (1,268.8 mcg/day of Fentanyl) MME/day:'60mg'$ /day(from oral medication)+ 126.96 mg/day (from intrathecal fentanyl) = 186.96MME/day(Aprox. 1.08MME/day/kg).    Medications ordered for procedure: Meds ordered this encounter  Medications  . [name] HCl 10 MG TABS    Sig: Take 1 tablet (10 mg total) by mouth every 6 (six) hours as needed. Must last 30 days    Dispense:  120 tablet    Refill:  0    Chronic Pain: STOP Act (Not applicable) Fill 1 day early if closed on refill date. Do not fill until: 05/26/2019. To last until: 06/25/2019. Avoid benzodiazepines within 8 hours of opioids  . [name] HCl 10 MG TABS    Sig: Take 1 tablet (10 mg total) by mouth every 6 (six) hours as needed. Must last 30 days    Dispense:  120 tablet    Refill:  0    Chronic Pain: STOP Act (Not applicable) Fill 1 day early if closed on refill date. Do not fill until:  06/25/2019. To last until: 07/25/2019. Avoid benzodiazepines within 8 hours of opioids   Medications administered: Jeneen Rinks B. Darling had no medications administered during this visit.  See the medical record for exact dosing, route, and time of administration.  Follow-up plan:   Return for Pump Refill (Max:14mo.  We will add baclofen to pump on next refill.  Interventional management options: Considering: NOTE: NO RFA until BMI <35  Palliative/therapeuticintrathecal pump refill and maintenance Diagnostic left Genicular NB Possible left Genicular nerve RFA Possible bilateral lumbar facet RFA(NO Lumbar RFA until BMI <35) Diagnostic caudal ESI+epidurogram Possible Racz procedure Diagnostic bilateral IA shoulder joint injection Diagnostic bilateral suprascapular NB Possible bilateral suprascapular nerve RFA Diagnostic LESI vs TFESI   Palliative PRN treatment(s): Palliative/therapeuticintrathecal pump refill and maintenance Diagnostic bilateral lumbar facet block #2      Recent Visits Date Type Provider Dept  02/09/19 Procedure visit Milinda Pointer, MD [facility]  Showing recent visits within past 90 days and meeting all other requirements   Today's Visits Date Type Provider Dept  04/05/19 Procedure visit Milinda Pointer, MD [facility]  Showing today's visits and meeting all other requirements   Future Appointments Date Type Provider Dept  05/25/19 Appointment Milinda Pointer, MD [facility]  Showing future appointments within next 90 days and meeting all other requirements   Disposition: Discharge home  Discharge Date & Time: 04/05/2019; 1130 hrs.   Primary Care Physician: Brian Contras, MD Location: [facility] Note by: Brian Cola, MD Date: 04/05/2019; Time: 11:49 AM  Disclaimer:  Medicine is not an Chief Strategy Officer. The only guarantee in medicine is that nothing is  guaranteed. It is important to note that the decision to proceed with this intervention was based on the information collected from the patient. The Data and conclusions were drawn from the patient's questionnaire, the interview, and the physical examination. Because the information was provided in large part by the patient, it cannot be guaranteed that it has not been purposely or unconsciously manipulated. Every effort has been made to obtain as much relevant data as possible for this evaluation. It is important to note that the conclusions that lead to this procedure are derived in large part from the available data. Always take into account that the treatment will also be dependent on availability of resources and existing treatment guidelines, considered by other Pain Management Practitioners as being common knowledge and practice, at the time of the intervention. For Medico-Legal purposes, it is also important to point out that variation in procedural techniques and pharmacological choices are the acceptable norm. The indications, contraindications, technique, and results of the above procedure should only be interpreted and judged by a Board-Certified Interventional Pain Specialist with extensive familiarity and expertise in the same exact procedure and technique.

## 2019-04-06 ENCOUNTER — Telehealth: Payer: Self-pay

## 2019-04-06 NOTE — Telephone Encounter (Signed)
Post procedure phone call. Patient states he is doing well.  

## 2019-04-12 MED FILL — Medication: INTRATHECAL | Qty: 1 | Status: AC

## 2019-04-19 DIAGNOSIS — R030 Elevated blood-pressure reading, without diagnosis of hypertension: Secondary | ICD-10-CM | POA: Insufficient documentation

## 2019-04-28 ENCOUNTER — Telehealth: Payer: Self-pay | Admitting: *Deleted

## 2019-05-09 ENCOUNTER — Other Ambulatory Visit: Payer: Self-pay

## 2019-05-09 MED ORDER — PAIN MANAGEMENT IT PUMP REFILL: 1.0000 | Freq: Once | INTRATHECAL | 0 refills | Status: AC

## 2019-05-10 ENCOUNTER — Telehealth: Payer: Self-pay

## 2019-05-10 NOTE — Telephone Encounter (Signed)
Ive already called you about this, but I wanted to try this communication .  The Patients appointment has been changed to 05-26-2019 but his script is for 05-25-2019. Would you please call and have this changed if needed.  Thank you

## 2019-05-16 ENCOUNTER — Other Ambulatory Visit: Payer: Self-pay

## 2019-05-23 ENCOUNTER — Other Ambulatory Visit: Payer: Self-pay | Admitting: Pain Medicine

## 2019-05-23 DIAGNOSIS — E559 Vitamin D deficiency, unspecified: Secondary | ICD-10-CM

## 2019-05-25 ENCOUNTER — Ambulatory Visit: Payer: Medicare Other | Admitting: Pain Medicine

## 2019-05-25 ENCOUNTER — Encounter: Payer: Medicare Other | Admitting: Pain Medicine

## 2019-05-26 ENCOUNTER — Ambulatory Visit: Payer: No Typology Code available for payment source | Attending: Pain Medicine | Admitting: Pain Medicine

## 2019-05-26 ENCOUNTER — Other Ambulatory Visit: Payer: Self-pay

## 2019-05-26 VITALS — BP 141/98 | HR 104 | Temp 97.9°F | Resp 20 | Ht 71.0 in | Wt >= 6400 oz

## 2019-05-26 DIAGNOSIS — G894 Chronic pain syndrome: Secondary | ICD-10-CM | POA: Insufficient documentation

## 2019-05-26 DIAGNOSIS — M5137 Other intervertebral disc degeneration, lumbosacral region: Secondary | ICD-10-CM | POA: Diagnosis present

## 2019-05-26 DIAGNOSIS — M545 Low back pain, unspecified: Secondary | ICD-10-CM

## 2019-05-26 DIAGNOSIS — G8929 Other chronic pain: Secondary | ICD-10-CM | POA: Insufficient documentation

## 2019-05-26 DIAGNOSIS — Z451 Encounter for adjustment and management of infusion pump: Secondary | ICD-10-CM | POA: Diagnosis present

## 2019-05-26 DIAGNOSIS — M79604 Pain in right leg: Secondary | ICD-10-CM | POA: Diagnosis present

## 2019-05-26 DIAGNOSIS — M79605 Pain in left leg: Secondary | ICD-10-CM | POA: Insufficient documentation

## 2019-05-26 DIAGNOSIS — Z978 Presence of other specified devices: Secondary | ICD-10-CM | POA: Diagnosis present

## 2019-05-26 DIAGNOSIS — M961 Postlaminectomy syndrome, not elsewhere classified: Secondary | ICD-10-CM | POA: Diagnosis present

## 2019-05-26 MED ORDER — OXYCODONE HCL 10 MG PO TABS: 10.0000 mg | ORAL_TABLET | Freq: Four times a day (QID) | ORAL | 0 refills | Status: DC | PRN

## 2019-05-26 NOTE — Patient Instructions (Signed)
Opioid Overdose Opioids are drugs that are often used to treat pain. Opioids include illegal drugs, such as heroin, as well as prescription pain medicines, such as codeine, morphine, hydrocodone, oxycodone, and fentanyl. An opioid overdose happens when you take too much of an opioid. An overdose may be intentional or accidental and can happen with any type of opioid. The effects of an overdose can be mild, dangerous, or even deadly. Opioid overdose is a medical emergency. What are the causes? This condition may be caused by:  Taking too much of an opioid on purpose.  Taking too much of an opioid by accident.  Using two or more substances that contain opioids at the same time.  Taking an opioid with a substance that affects your heart, breathing, or blood pressure. These include alcohol, tranquilizers, sleeping pills, illegal drugs, and some over-the-counter medicines. This condition may also happen due to an error made by:  A health care provider who prescribes a medicine.  The pharmacist who fills the prescription order. What increases the risk? This condition is more likely in:  Children. They may be attracted to colorful pills. Because of a child's small size, even a small amount of a drug can be dangerous.  Older people. They may be taking many different drugs. Older people may have difficulty reading labels or remembering when they last took their medicine. They may also be more sensitive to the effects of opioids.  People with chronic medical conditions, especially heart, liver, kidney, or neurological diseases.  People who take an opioid for a long period of time.  People who use: ? Illegal drugs. IV heroin is especially dangerous. ? Other substances, including alcohol, while using an opioid.  People who have: ? A history of drug or alcohol abuse. ? Certain mental health conditions. ? A history of previous drug overdoses.  People who take opioids that are not prescribed  for them. What are the signs or symptoms? Symptoms of this condition depend on the type of opioid and the amount that was taken. Common symptoms include:  Sleepiness or difficulty waking from sleep.  Decrease in attention.  Confusion.  Slurred speech.  Slowed breathing and a slow pulse (bradycardia).  Nausea and vomiting.  Abnormally small pupils. Signs and symptoms that require emergency treatment include:  Cold, clammy, and pale skin.  Blue lips and fingernails.  Vomiting.  Gurgling sounds in the throat.  A pulse that is very slow or difficult to detect.  Breathing that is very irregular, slow, noisy, or difficult to detect.  Limp body.  Inability to respond to speech or be awakened from sleep (stupor).  Seizures. How is this diagnosed? This condition is diagnosed based on your symptoms and medical history. It is important to tell your health care provider:  About all of the opioids that you took.  When you took the opioids.  Whether you were drinking alcohol or using marijuana, cocaine, or other drugs. Your health care provider will do a physical exam. This exam may include:  Checking and monitoring your heart rate and rhythm, breathing rate, temperature, and blood pressure (vital signs).  Measuring oxygen levels in your blood.  Checking for abnormally small pupils. You may also have blood tests or urine tests. You may have X-rays if you are having severe breathing problems. How is this treated? This condition requires immediate medical treatment and hospitalization. Treatment is given in the hospital intensive care (ICU) setting. Supporting your vital signs and your breathing is the first step in   treating an opioid overdose. Treatment may also include:  Giving salts and minerals (electrolytes) along with fluids through an IV.  Inserting a breathing tube (endotracheal tube) in your airway to help you breathe if you cannot breathe on your own or you are in  danger of not being able to breathe on your own.  Giving oxygen through a small tube under your nose.  Passing a tube through your nose and into your stomach (nasogastric tube, or NG tube) to empty your stomach.  Giving medicines that: ? Increase your blood pressure. ? Relieve nausea and vomiting. ? Relieve abdominal pain and cramping. ? Reverse the effects of the opioid (naloxone).  Monitoring your heart and oxygen levels.  Ongoing counseling and mental health support if you intentionally overdosed or used an illegal drug. Follow these instructions at home:  Medicines  Take over-the-counter and prescription medicines only as told by your health care provider.  Always ask your health care provider about possible side effects and interactions of any new medicine that you start taking.  Keep a list of all the medicines that you take, including over-the-counter medicines. Bring this list with you to all your medical visits. General instructions  Drink enough fluid to keep your urine pale yellow.  Keep all follow-up visits as told by your health care provider. This is important. How is this prevented?  Read the drug inserts that come with your opioid pain medicines.  Take medicines only as told by your health care provider. Do not take more medicine than you are told. Do not take medicines more frequently than you are told.  Do not drink alcohol or take sedatives when taking opioids.  Do not use illegal or recreational drugs, including cocaine, ecstasy, and marijuana.  Do not take opioid medicines that are not prescribed for you.  Store all medicines in safety containers that are out of the reach of children.  Get help if you are struggling with: ? Alcohol or drug use. ? Depression or another mental health problem. ? Thoughts of hurting yourself or another person.  Keep the phone number of your local poison control center near your phone or in your mobile phone. In the  [place] the hotline of the National Poison Control Center is (800) 222-1222.  If you were prescribed naloxone, make sure you understand how to take it. Contact a health care provider if you:  Need help understanding how to take your pain medicines.  Feel your medicines are too strong.  Are concerned that your pain medicines are not working well for your pain.  Develop new symptoms or side effects when you are taking medicines. Get help right away if:  You or someone else is having symptoms of an opioid overdose. Get help even if you are not sure.  You have serious thoughts about hurting yourself or others.  You have: ? Chest pain. ? Difficulty breathing. ? A loss of consciousness. These symptoms may represent a serious problem that is an emergency. Do not wait to see if the symptoms will go away. Get medical help right away. Call your local emergency services (911 in the [place] Do not drive yourself to the hospital. If you ever feel like you may hurt yourself or others, or have thoughts about taking your own life, get help right away. You can go to your nearest emergency department or call:  Your local emergency services (911 in the [place]  A suicide crisis helpline, such as the National Suicide Prevention Lifeline at   1-800-273-8255. This is open 24 hours a day. Summary  Opioids are drugs that are often used to treat pain. Opioids include illegal drugs, such as heroin, as well as prescription pain medicines.  An opioid overdose happens when you take too much of an opioid.  Overdoses can be intentional or accidental.  Opioid overdose is very dangerous. It is a life-threatening emergency.  If you or someone you know is experiencing an opioid overdose, get help right away. This information is not intended to replace advice given to you by your health care provider. Make sure you discuss any questions you have with your health care provider. Document Revised: 03/18/2018 Document  Reviewed: 03/18/2018 [employer] Patient Education  2020 [employer]  

## 2019-05-26 NOTE — Progress Notes (Addendum)
PROVIDER NOTE: Information contained herein reflects review and annotations entered in association with encounter. Interpretation of such information and data should be left to medically-trained personnel. Information provided to patient can be located elsewhere in the medical record under "Patient Instructions". Document created using STT-dictation technology, any transcriptional errors that may result from process are unintentional.    Patient: [name]  Service Category: Procedure  Provider: Gaspar Cola, MD  DOB: 1966/01/17  DOS: 05/26/2019  Location: [facility]  MRN: [id number]  Setting: Ambulatory - outpatient  Referring Provider: Antony Contras, MD  Type: Established Patient  Specialty: Interventional Pain Management  PCP: Brian Contras, MD   Primary Reason for Visit: Interventional Pain Management Treatment. CC: Back Pain (lower)  Procedure:          Intrathecal Drug Delivery System (IDDS):  Type: Reservoir Refill [phone]) Today we will be adding baclofen 200 mcg/mL to his intrathecal pump. Region: Abdominal Laterality: Left  Type of Pump: Medtronic Synchromed II Delivery Route: Intrathecal Type of Pain Treated: Neuropathic/Nociceptive Primary Medication Class: Opioid/opiate  Medication, Concentration, Infusion Program, & Delivery Rate: Please see scanned programming printout.   Indications: 1. Chronic pain syndrome   2. Failed back surgical syndrome   3. DDD (degenerative disc disease), lumbosacral   4. Chronic low back pain (Primary Area of Pain) (Bilateral) (R>L)   5. Chronic lower extremity pain (Secondary area of Pain) (Bilateral) (L>R)   6. Presence of intrathecal pump   7. Encounter for adjustment or management of infusion pump    Pain Assessment: Self-Reported Pain Score: 5 /10             Reported level is compatible with observation.        Pharmacotherapy Assessment  Analgesic: Oxycodone IR 10 mg, 1 tab PO q 6 hrs (40 mg/day of  oxycodone)+ 52.9 mcg/hr intrathecal PF-Fentanyl (1,268.8 mcg/day of Fentanyl) MME/day:36m/day(from oral medication)+ 126.96 mg/day (from intrathecal fentanyl) = 186.96MME/day(Aprox. 1.08MME/day/kg).   Monitoring: [place] PMP: PDMP reviewed during this encounter.       Pharmacotherapy: No side-effects or adverse reactions reported. Compliance: No problems identified. Effectiveness: Clinically acceptable. Plan: Refer to "POC".  UDS:  Summary  Date Value Ref Range Status  06/30/2018 FINAL  Final    Comment:    ==================================================================== TOXASSURE SELECT 13 (MW) ==================================================================== Test                             Result       Flag       Units Drug Present and Declared for Prescription Verification   Oxycodone                      955          EXPECTED   ng/mg creat   Oxymorphone                    2975         EXPECTED   ng/mg creat   Noroxycodone                   1684         EXPECTED   ng/mg creat   Noroxymorphone                 596          EXPECTED   ng/mg creat    Sources of oxycodone are scheduled  prescription medications.    Oxymorphone, noroxycodone, and noroxymorphone are expected    metabolites of oxycodone. Oxymorphone is also available as a    scheduled prescription medication. Drug Present not Declared for Prescription Verification   Morphine                       141          UNEXPECTED ng/mg creat    Potential sources of morphine include administration of codeine    or morphine, use of heroin, or ingestion of poppy seeds.   Fentanyl                       6            UNEXPECTED ng/mg creat   Norfentanyl                    48           UNEXPECTED ng/mg creat    Source of fentanyl is a scheduled prescription medication,    including IV, patch, and transmucosal formulations. Norfentanyl    is an expected metabolite of  fentanyl. ==================================================================== Test                      Result    Flag   Units      Ref Range   Creatinine              233              mg/dL      >=20 ==================================================================== Declared Medications:  The flagging and interpretation on this report are based on the  following declared medications.  Unexpected results may arise from  inaccuracies in the declared medications.  **Note: The testing scope of this panel includes these medications:  Oxycodone  **Note: The testing scope of this panel does not include following  reported medications:  Potassium  Promethazine  Vitamin B1 (Thiamine) ==================================================================== For clinical consultation, please call [phone]. ====================================================================    Intrathecal Pump Therapy Assessment  Manufacturer: Medtronic Synchromed Type: Programmable Volume: 40 mL reservoir MRI compatibility: Yes   Drug content:  Primary Medication Class:Opioid Primary Medication:PF-Fentanyl(2,000.0 mcg/ml) Secondary Medication:PF-Bupivacaine(20.7m/mL) Other Medication:PF-Baclofen (200 mcg/mL) added to the pump on 05/26/2019   Programming:  Type: Simple continuous. See pump readout for details.   Changes:  Medication Change: Today we have added preservative-free baclofen 200 mcg/mL to his intrathecal pump mixture. Rate Change: No change in rate  Reported side-effects or adverse reactions: None reported  Effectiveness: Described as relatively effective, allowing for increase in activities of daily living (ADL) Clinically meaningful improvement in function (CMIF): Sustained CMIF goals met  Plan: Pump refill today  Pre-op Assessment:  [name] a 54y.o. (year old), male patient, seen today for interventional treatment. He  has a past surgical history that includes  Back surgery; morphine pump; Tonsillectomy; Joint replacement; Shoulder arthroscopy with subacromial decompression, rotator cuff repair and bicep tendon repair (Right, 08/18/2013); Shoulder arthroscopy with subacromial decompression and bicep tendon repair (Left, 10/06/2013); and Laparoscopic gastric sleeve resection (N/A, 01/08/2015). Mr. HAzzarellohas a current medication list which includes the following prescription(s): acetaminophen, aspirin, bumetanide, calcium carbonate, vitamin d3, [START ON 03/26/2020] cyclobenzaprine, escitalopram, esomeprazole, gnp milk of magnesia, lubiprostone, magnesium oxide, NON FORMULARY, [START ON 03/26/2020] ondansetron, oxycodone hcl, [START ON 06/25/2019] oxycodone hcl, [START ON 03/26/2020] pregabalin, [START ON 03/26/2020] tizanidine, ergocalciferol, and [START ON 07/25/2019]  oxycodone hcl. His primarily concern today is the Back Pain (lower)  Initial Vital Signs:  Pulse/HCG Rate: (!) 104  Temp: 97.9 F (36.6 C) Resp: 20 BP: (!) 141/98 SpO2: 99 %  BMI: Estimated body mass index is 55.79 kg/m as calculated from the following:   Height as of this encounter: _0  (1.803 m).   Weight as of this encounter: 400 lb (181.4 kg).  Risk Assessment: Allergies: Reviewed. He is allergic to tape.  Allergy Precautions: None required Coagulopathies: Reviewed. None identified.  Blood-thinner therapy: None at this time Active Infection(s): Reviewed. None identified. Mr. Bertha is afebrile  Site Confirmation: Mr. Lacher was asked to confirm the procedure and laterality before marking the site Procedure checklist: Completed Consent: Before the procedure and under the influence of no sedative(s), amnesic(s), or anxiolytics, the patient was informed of the treatment options, risks and possible complications. To fulfill our ethical and legal obligations, as recommended by the American Medical Association's Code of Ethics, I have informed the patient of my clinical impression; the  nature and purpose of the treatment or procedure; the risks, benefits, and possible complications of the intervention; the alternatives, including doing nothing; the risk(s) and benefit(s) of the alternative treatment(s) or procedure(s); and the risk(s) and benefit(s) of doing nothing.  Mr. Cuevas was provided with information about the general risks and possible complications associated with most interventional procedures. These include, but are not limited to: failure to achieve desired goals, infection, bleeding, organ or nerve damage, allergic reactions, paralysis, and/or death.  In addition, he was informed of those risks and possible complications associated to this particular procedure, which include, but are not limited to: damage to the implant; failure to decrease pain; local, systemic, or serious CNS infections, intraspinal abscess with possible cord compression and paralysis, or life-threatening such as meningitis; bleeding; organ damage; nerve injury or damage with subsequent sensory, motor, and/or autonomic system dysfunction, resulting in transient or permanent pain, numbness, and/or weakness of one or several areas of the body; allergic reactions, either minor or major life-threatening, such as anaphylactic or anaphylactoid reactions.  Furthermore, Mr. Runyon was informed of those risks and complications associated with the medications. These include, but are not limited to: allergic reactions (i.e.: anaphylactic or anaphylactoid reactions); endorphine suppression; bradycardia and/or hypotension; water retention and/or peripheral vascular relaxation leading to lower extremity edema and possible stasis ulcers; respiratory depression and/or shortness of breath; decreased metabolic rate leading to weight gain; swelling or edema; medication-induced neural toxicity; particulate matter embolism and blood vessel occlusion with resultant organ, and/or nervous system infarction; and/or intrathecal  granuloma formation with possible spinal cord compression and permanent paralysis.  Before refilling the pump Mr. Alomar was informed that some of the medications used in the devise may not be FDA approved for such use and therefore it constitutes an off-label use of the medications.  Finally, he was informed that Medicine is not an exact science; therefore, there is also the possibility of unforeseen or unpredictable risks and/or possible complications that may result in a catastrophic outcome. The patient indicated having understood very clearly. We have given the patient no guarantees and we have made no promises. Enough time was given to the patient to ask questions, all of which were answered to the patient's satisfaction. Mr. Diaz has indicated that he wanted to continue with the procedure. Attestation: I, the ordering provider, attest that I have discussed with the patient the benefits, risks, side-effects, alternatives, likelihood of achieving goals, and potential problems during recovery for  the procedure that I have provided informed consent. Date  Time: 05/26/2019 11:13 AM  Pre-Procedure Preparation:  Monitoring: As per clinic protocol. Respiration, ETCO2, SpO2, BP, heart rate and rhythm monitor placed and checked for adequate function Safety Precautions: Patient was assessed for positional comfort and pressure points before starting the procedure. Time-out: I initiated and conducted the "Time-out" before starting the procedure, as per protocol. The patient was asked to participate by confirming the accuracy of the "Time Out" information. Verification of the correct person, site, and procedure were performed and confirmed by me, the nursing staff, and the patient. "Time-out" conducted as per Joint Commission's Universal Protocol (UP.01.01.01). Time: 1158  Description of Procedure:          Position: Supine Target Area: Central-port of intrathecal pump. Approach: Anterior, 90 degree  angle approach. Area Prepped: Entire Area around the pump implant. Prepping solution: DuraPrep (Iodine Povacrylex [0.7% available iodine] and Isopropyl Alcohol, 74% w/w) Safety Precautions: Aspiration looking for blood return was conducted prior to all injections. At no point did we inject any substances, as a needle was being advanced. No attempts were made at seeking any paresthesias. Safe injection practices and needle disposal techniques used. Medications properly checked for expiration dates. SDV (single dose vial) medications used. Description of the Procedure: Protocol guidelines were followed. Two nurses trained to do implant refills were present during the entire procedure. The refill medication was checked by both healthcare providers as well as the patient. The patient was included in the "Time-out" to verify the medication. The patient was placed in position. The pump was identified. The area was prepped in the usual manner. The sterile template was positioned over the pump, making sure the side-port location matched that of the pump. Both, the pump and the template were held for stability. The needle provided in the Medtronic Kit was then introduced thru the center of the template and into the central port. The pump content was aspirated and discarded volume documented. The new medication was slowly infused into the pump, thru the filter, making sure to avoid overpressure of the device. The needle was then removed and the area cleansed, making sure to leave some of the prepping solution back to take advantage of its long term bactericidal properties. The pump was interrogated and programmed to reflect the correct medication, volume, and dosage. The program was printed and taken to the physician for approval. Once checked and signed by the physician, a copy was provided to the patient and another scanned into the EMR. Vitals:   05/26/19 1111  BP: (!) 141/98  Pulse: (!) 104  Resp: 20  Temp: 97.9  F (36.6 C)  TempSrc: Oral  SpO2: 99%  Weight: (!) 400 lb (181.4 kg)  Height: _0  (1.803 m)    Start Time: 1158 hrs. End Time: 1205 hrs. Materials & Medications: Medtronic Refill Kit Medication(s): Please see chart orders for details.  Imaging Guidance:          Type of Imaging Technique: None used Indication(s): N/A Exposure Time: No patient exposure Contrast: None used. Fluoroscopic Guidance: N/A Ultrasound Guidance: N/A Interpretation: N/A  Antibiotic Prophylaxis:   Anti-infectives (From admission, onward)   None     Indication(s): None identified  Post-operative Assessment:  Post-procedure Vital Signs:  Pulse/HCG Rate: (!) 104  Temp: 97.9 F (36.6 C) Resp: 20 BP: (!) 141/98 SpO2: 99 %  EBL: None  Complications: No immediate post-treatment complications observed by team, or reported by patient.  Note: The patient tolerated  the entire procedure well. A repeat set of vitals were taken after the procedure and the patient was kept under observation following institutional policy, for this type of procedure. Post-procedural neurological assessment was performed, showing return to baseline, prior to discharge. The patient was provided with post-procedure discharge instructions, including a section on how to identify potential problems. Should any problems arise concerning this procedure, the patient was given instructions to immediately contact us, at any time, without hesitation. In any case, we plan to contact the patient by telephone for a follow-up status report regarding this interventional procedure.  Comments:  No additional relevant information.  Plan of Care  Orders:  Orders Placed This Encounter  Procedures  . PUMP REFILL    Maintain Protocol by having two(2) healthcare providers during procedure and programming.    Scheduling Instructions:     Please refill intrathecal pump today.    Order Specific Question:   Where will this procedure be performed?     Answer:   [facility]  . PUMP REFILL    Whenever possible schedule on a procedure today.    Standing Status:   Future    Standing Expiration Date:   10/23/2019    Scheduling Instructions:     Please schedule intrathecal pump refill based on pump programming. Avoid schedule intervals of more than 120 days (4 months).    Order Specific Question:   Where will this procedure be performed?    Answer:   [facility]  . Informed Consent Details: Physician/Practitioner Attestation; Transcribe to consent form and obtain patient signature    Provider Attestation: I, Nenana Dossie Arbour, MD, (Pain Management Specialist), the physician/practitioner, attest that I have discussed with the patient the benefits, risks, side effects, alternatives, likelihood of achieving goals and potential problems during recovery for the procedure that I have provided informed consent.    Scheduling Instructions:     Procedure: Intrathecal Pump Refill     Attending Physician: Beatriz Chancellor A. Dossie Arbour, MD     Indications: Chronic Pain Syndrome (G89.4)     Transcribe to consent form and obtain patient signature.   Chronic Opioid Analgesic:  Oxycodone IR 10 mg, 1 tab PO q 6 hrs (40 mg/day of oxycodone)+ 52.9 mcg/hr intrathecal PF-Fentanyl (1,268.8 mcg/day of Fentanyl) MME/day:54m/day(from oral medication)+ 126.96 mg/day (from intrathecal fentanyl) = 186.96MME/day(Aprox. 1.08MME/day/kg).   Medications ordered for procedure: Meds ordered this encounter  Medications  . Oxycodone HCl 10 MG TABS    Sig: Take 1 tablet (10 mg total) by mouth every 6 (six) hours as needed. Must last 30 days    Dispense:  120 tablet    Refill:  0    Chronic Pain: STOP Act (Not applicable) Fill 1 day early if closed on refill date. Do not fill until: 07/25/2019. To last until: 08/24/2019. Avoid benzodiazepines within 8 hours of opioids   Medications administered: JJeneen RinksB. Matassa had no medications administered during this  visit.  See the medical record for exact dosing, route, and time of administration.  Follow-up plan:   Return for Pump Refill (Max:367mo       Interventional management options: Considering: NOTE: NO RFA until BMI <35  Palliative/therapeuticintrathecal pump refill and maintenance Diagnostic left Genicular NB Possible left Genicular nerve RFA Possible bilateral lumbar facet RFA(NO Lumbar RFA until BMI <35) Diagnostic caudal ESI+epidurogram Possible Racz procedure Diagnostic bilateral IA shoulder joint injection Diagnostic bilateral suprascapular NB Possible bilateral suprascapular nerve RFA Diagnostic LESI vs TFESI   Palliative PRN treatment(s): Palliative/therapeuticintrathecal  pump refill and maintenance Diagnostic bilateral lumbar facet block #2    Recent Visits Date Type Provider Dept  04/05/19 Procedure visit Milinda Pointer, MD [facility]  Showing recent visits within past 90 days and meeting all other requirements   Today's Visits Date Type Provider Dept  05/26/19 Procedure visit Milinda Pointer, MD [facility]  Showing today's visits and meeting all other requirements   Future Appointments Date Type Provider Dept  07/19/19 Appointment Milinda Pointer, MD [facility]  07/20/19 Appointment Milinda Pointer, MD [facility]  Showing future appointments within next 90 days and meeting all other requirements   Disposition: Discharge home  Discharge (Date  Time): 05/26/2019; 1210 hrs.   Primary Care Physician: Brian Contras, MD Location: [facility] Note by: Brian Cola, MD Date: 05/26/2019; Time: 12:38 PM  Disclaimer:  Medicine is not an Chief Strategy Officer. The only guarantee in medicine is that nothing is guaranteed. It is important to note that the decision to proceed with this intervention was based on the information collected from the patient. The Data and  conclusions were drawn from the patient's questionnaire, the interview, and the physical examination. Because the information was provided in large part by the patient, it cannot be guaranteed that it has not been purposely or unconsciously manipulated. Every effort has been made to obtain as much relevant data as possible for this evaluation. It is important to note that the conclusions that lead to this procedure are derived in large part from the available data. Always take into account that the treatment will also be dependent on availability of resources and existing treatment guidelines, considered by other Pain Management Practitioners as being common knowledge and practice, at the time of the intervention. For Medico-Legal purposes, it is also important to point out that variation in procedural techniques and pharmacological choices are the acceptable norm. The indications, contraindications, technique, and results of the above procedure should only be interpreted and judged by a Board-Certified Interventional Pain Specialist with extensive familiarity and expertise in the same exact procedure and technique.

## 2019-05-26 NOTE — Progress Notes (Signed)
Nursing Pain Medication Assessment:  Safety precautions to be maintained throughout the outpatient stay will include: orient to surroundings, keep bed in low position, maintain call bell within reach at all times, provide assistance with transfer out of bed and ambulation.  Medication Inspection Compliance: Mr. Yung did not comply with our request to bring his pills to be counted. He was reminded that bringing the medication bottles, even when empty, is a requirement.  Medication: None brought in. Pill/Patch Count: None available to be counted. Bottle Appearance: No container available. Did not bring bottle(s) to appointment. Filled Date: N/A Last Medication intake:  Today  

## 2019-05-27 ENCOUNTER — Telehealth: Payer: Self-pay

## 2019-05-27 MED FILL — Medication: INTRATHECAL | Qty: 1 | Status: AC

## 2019-05-27 NOTE — Telephone Encounter (Signed)
Post pump refill phone call.  Patient states he is doing good.

## 2019-06-11 ENCOUNTER — Other Ambulatory Visit: Payer: Self-pay

## 2019-06-11 ENCOUNTER — Emergency Department (HOSPITAL_COMMUNITY): Payer: 59

## 2019-06-11 ENCOUNTER — Encounter (HOSPITAL_COMMUNITY): Payer: Self-pay

## 2019-06-11 ENCOUNTER — Inpatient Hospital Stay (HOSPITAL_COMMUNITY)
Admission: EM | Admit: 2019-06-11 | Discharge: 2019-06-13 | DRG: 291 | Disposition: A | Discharge status: Home / Self Care | Payer: 59 | Attending: Internal Medicine | Admitting: Internal Medicine

## 2019-06-11 DIAGNOSIS — J9621 Acute and chronic respiratory failure with hypoxia: Secondary | ICD-10-CM | POA: Diagnosis not present

## 2019-06-11 DIAGNOSIS — E662 Morbid (severe) obesity with alveolar hypoventilation: Secondary | ICD-10-CM | POA: Diagnosis not present

## 2019-06-11 DIAGNOSIS — Z6841 Body Mass Index (BMI) 40.0 and over, adult: Secondary | ICD-10-CM | POA: Diagnosis not present

## 2019-06-11 DIAGNOSIS — F329 Major depressive disorder, single episode, unspecified: Secondary | ICD-10-CM | POA: Diagnosis present

## 2019-06-11 DIAGNOSIS — Z79899 Other long term (current) drug therapy: Secondary | ICD-10-CM

## 2019-06-11 DIAGNOSIS — Z9119 Patient's noncompliance with other medical treatment and regimen: Secondary | ICD-10-CM

## 2019-06-11 DIAGNOSIS — Z91138 Patient's unintentional underdosing of medication regimen for other reason: Secondary | ICD-10-CM

## 2019-06-11 DIAGNOSIS — J449 Chronic obstructive pulmonary disease, unspecified: Secondary | ICD-10-CM | POA: Diagnosis present

## 2019-06-11 DIAGNOSIS — I5031 Acute diastolic (congestive) heart failure: Secondary | ICD-10-CM | POA: Diagnosis present

## 2019-06-11 DIAGNOSIS — J9601 Acute respiratory failure with hypoxia: Secondary | ICD-10-CM

## 2019-06-11 DIAGNOSIS — M549 Dorsalgia, unspecified: Secondary | ICD-10-CM | POA: Diagnosis present

## 2019-06-11 DIAGNOSIS — K581 Irritable bowel syndrome with constipation: Secondary | ICD-10-CM | POA: Diagnosis present

## 2019-06-11 DIAGNOSIS — Z20822 Contact with and (suspected) exposure to covid-19: Secondary | ICD-10-CM | POA: Diagnosis not present

## 2019-06-11 DIAGNOSIS — G8929 Other chronic pain: Secondary | ICD-10-CM | POA: Diagnosis present

## 2019-06-11 DIAGNOSIS — K219 Gastro-esophageal reflux disease without esophagitis: Secondary | ICD-10-CM | POA: Diagnosis present

## 2019-06-11 DIAGNOSIS — I5033 Acute on chronic diastolic (congestive) heart failure: Secondary | ICD-10-CM | POA: Diagnosis not present

## 2019-06-11 DIAGNOSIS — R739 Hyperglycemia, unspecified: Secondary | ICD-10-CM | POA: Diagnosis present

## 2019-06-11 DIAGNOSIS — I509 Heart failure, unspecified: Secondary | ICD-10-CM | POA: Diagnosis not present

## 2019-06-11 DIAGNOSIS — Z833 Family history of diabetes mellitus: Secondary | ICD-10-CM

## 2019-06-11 DIAGNOSIS — Z96652 Presence of left artificial knee joint: Secondary | ICD-10-CM | POA: Diagnosis present

## 2019-06-11 DIAGNOSIS — T502X6A Underdosing of carbonic-anhydrase inhibitors, benzothiadiazides and other diuretics, initial encounter: Secondary | ICD-10-CM | POA: Diagnosis present

## 2019-06-11 DIAGNOSIS — Z7982 Long term (current) use of aspirin: Secondary | ICD-10-CM

## 2019-06-11 HISTORY — DX: Chronic obstructive pulmonary disease, unspecified: J44.9

## 2019-06-11 LAB — CBC WITH DIFFERENTIAL/PLATELET
Abs Immature Granulocytes: 0.05 10*3/uL (ref 0.00–0.07)
Basophils Absolute: 0 10*3/uL (ref 0.0–0.1)
Basophils Relative: 0 %
Eosinophils Absolute: 0.2 10*3/uL (ref 0.0–0.5)
Eosinophils Relative: 2 %
HCT: 41.4 % (ref 39.0–52.0)
Hemoglobin: 12.5 g/dL — ABNORMAL LOW (ref 13.0–17.0)
Immature Granulocytes: 1 %
Lymphocytes Relative: 20 %
Lymphs Abs: 1.5 10*3/uL (ref 0.7–4.0)
MCH: 30.2 pg (ref 26.0–34.0)
MCHC: 30.2 g/dL (ref 30.0–36.0)
MCV: 100 fL (ref 80.0–100.0)
Monocytes Absolute: 0.4 10*3/uL (ref 0.1–1.0)
Monocytes Relative: 5 %
Neutro Abs: 5.5 10*3/uL (ref 1.7–7.7)
Neutrophils Relative %: 72 %
Platelets: 208 10*3/uL (ref 150–400)
RBC: 4.14 MIL/uL — ABNORMAL LOW (ref 4.22–5.81)
RDW: 14.6 % (ref 11.5–15.5)
WBC: 7.6 10*3/uL (ref 4.0–10.5)
nRBC: 0.9 % — ABNORMAL HIGH (ref 0.0–0.2)

## 2019-06-11 LAB — COMPREHENSIVE METABOLIC PANEL
ALT: 31 U/L (ref 0–44)
AST: 28 U/L (ref 15–41)
Albumin: 3.3 g/dL — ABNORMAL LOW (ref 3.5–5.0)
Alkaline Phosphatase: 73 U/L (ref 38–126)
Anion gap: 6 (ref 5–15)
BUN: 14 mg/dL (ref 6–20)
CO2: 34 mmol/L — ABNORMAL HIGH (ref 22–32)
Calcium: 8.4 mg/dL — ABNORMAL LOW (ref 8.9–10.3)
Chloride: 95 mmol/L — ABNORMAL LOW (ref 98–111)
Creatinine, Ser: 1 mg/dL (ref 0.61–1.24)
GFR calc Af Amer: >60 mL/min (ref >60)
GFR calc non Af Amer: >60 mL/min (ref >60)
Glucose, Bld: 129 mg/dL — ABNORMAL HIGH (ref 70–99)
Potassium: 5 mmol/L (ref 3.5–5.1)
Sodium: 135 mmol/L (ref 135–145)
Total Bilirubin: 0.8 mg/dL (ref 0.3–1.2)
Total Protein: 7.2 g/dL (ref 6.5–8.1)

## 2019-06-11 LAB — SARS CORONAVIRUS 2 (TAT 6-24 HRS): SARS Coronavirus 2: NEGATIVE

## 2019-06-11 LAB — BRAIN NATRIURETIC PEPTIDE: B Natriuretic Peptide: 97 pg/mL (ref 0.0–100.0)

## 2019-06-11 MED ORDER — PANTOPRAZOLE SODIUM 40 MG PO TBEC
40.0000 mg | DELAYED_RELEASE_TABLET | Freq: Every day | ORAL | Status: DC
  Administered 2019-06-11 – 2019-06-13 (×3): 40 mg via ORAL
  Filled 2019-06-11 (×3): qty 1

## 2019-06-11 MED ORDER — ASPIRIN EC 81 MG PO TBEC
81.0000 mg | DELAYED_RELEASE_TABLET | Freq: Every day | ORAL | Status: DC
  Administered 2019-06-11 – 2019-06-13 (×3): 81 mg via ORAL
  Filled 2019-06-11 (×3): qty 1

## 2019-06-11 MED ORDER — ESCITALOPRAM OXALATE 10 MG PO TABS: 10.0000 mg | ORAL_TABLET | Freq: Every day | ORAL | Status: DC

## 2019-06-11 MED ORDER — ESCITALOPRAM OXALATE 20 MG PO TABS
20.0000 mg | ORAL_TABLET | Freq: Every day | ORAL | Status: DC
  Administered 2019-06-11 – 2019-06-13 (×3): 20 mg via ORAL
  Filled 2019-06-11 (×3): qty 1

## 2019-06-11 MED ORDER — OXYCODONE HCL 5 MG PO TABS
10.0000 mg | ORAL_TABLET | Freq: Four times a day (QID) | ORAL | Status: DC | PRN
  Administered 2019-06-11 – 2019-06-13 (×4): 10 mg via ORAL
  Filled 2019-06-11 (×4): qty 2

## 2019-06-11 MED ORDER — MAGNESIUM OXIDE 400 (241.3 MG) MG PO TABS
400.0000 mg | ORAL_TABLET | Freq: Two times a day (BID) | ORAL | Status: DC
  Administered 2019-06-11 – 2019-06-13 (×4): 400 mg via ORAL
  Filled 2019-06-11 (×4): qty 1

## 2019-06-11 MED ORDER — HEPARIN SODIUM (PORCINE) 5000 UNIT/ML IJ SOLN
5000.0000 [IU] | Freq: Three times a day (TID) | INTRAMUSCULAR | Status: DC
  Administered 2019-06-11 – 2019-06-13 (×5): 5000 [IU] via SUBCUTANEOUS
  Filled 2019-06-11 (×4): qty 1

## 2019-06-11 MED ORDER — ONDANSETRON 8 MG PO TBDP
8.0000 mg | ORAL_TABLET | Freq: Three times a day (TID) | ORAL | Status: DC | PRN
  Filled 2019-06-11: qty 1

## 2019-06-11 MED ORDER — ACETAMINOPHEN 500 MG PO TABS: 1000.0000 mg | ORAL_TABLET | Freq: Four times a day (QID) | ORAL | Status: DC | PRN

## 2019-06-11 MED ORDER — FUROSEMIDE 10 MG/ML IJ SOLN
40.0000 mg | Freq: Once | INTRAMUSCULAR | Status: AC
  Administered 2019-06-11: 40 mg via INTRAVENOUS
  Filled 2019-06-11: qty 4

## 2019-06-11 MED ORDER — PREGABALIN 50 MG PO CAPS
150.0000 mg | ORAL_CAPSULE | Freq: Three times a day (TID) | ORAL | Status: DC
  Administered 2019-06-11 – 2019-06-13 (×6): 150 mg via ORAL
  Filled 2019-06-11 (×6): qty 3

## 2019-06-11 MED ORDER — LUBIPROSTONE 24 MCG PO CAPS
24.0000 ug | ORAL_CAPSULE | Freq: Two times a day (BID) | ORAL | Status: DC
  Administered 2019-06-11 – 2019-06-13 (×3): 24 ug via ORAL
  Filled 2019-06-11 (×4): qty 1

## 2019-06-11 MED ORDER — TIZANIDINE HCL 4 MG PO TABS
4.0000 mg | ORAL_TABLET | Freq: Three times a day (TID) | ORAL | Status: DC | PRN
  Administered 2019-06-12: 22:00:00 4 mg via ORAL
  Filled 2019-06-11: qty 1

## 2019-06-11 MED ORDER — FUROSEMIDE 10 MG/ML IJ SOLN
40.0000 mg | Freq: Two times a day (BID) | INTRAMUSCULAR | Status: DC
  Administered 2019-06-11 – 2019-06-12 (×2): 40 mg via INTRAVENOUS
  Filled 2019-06-11 (×2): qty 4

## 2019-06-11 NOTE — ED Provider Notes (Signed)
[name]-EMERGENCY DEPT Provider Note   CSN: [id number] Arrival date & time: 06/11/19  1275     History Chief Complaint  Patient presents with  . Shortness of Breath    [name] is a 54 y.o. male with significant PMH of diastolic heart failure, COPD, OSA, and chronic back pain with intrathecal pump, who presents for shortness of breath and jerking arm movements. EMS reported pt was short of breath last evening, and attempted to wear his CPAP which did not improve his symptoms. O2 saturation on the scene was reportedly 50. Placed on 6L [place] with improvement to the high 90's.   Pt states he has been having progressive shortness of breath for the last month or so. Denies fevers/chills, recent illness, sick contacts, chest pain, orthopnea, or PND. History of HFpEF, prescribed 2mg  bumex in the AM and 1mg  in the PM. Pt says he has not been taking bumex because he has difficulty getting up with frequent urination, thinks he maybe takes it only 1-3 times per week. Weights himself once or twice a week and has noticed increasing weights. Thinks he's gained 25-30lbs since January. Endorses increased LE, UE, and abdominal swelling. Began having arm jerking movement last evening which he says are what happened before his last CHF admission in 06/2018.   Also has a history of COPD and OSA. Pt is non-adherent to CPAP and cannot remember the last time he wore it before yesterday. Not on home oxygen or chronic inhalers at home. His COPD is managed by his PCP who he says he has not seen in while.  The history is provided by the patient.  Shortness of Breath Severity:  Moderate Onset quality:  Gradual Duration:  1 month Timing:  Constant Progression:  Worsening Chronicity:  Recurrent Context: not activity and not URI   Relieved by:  Oxygen Worsened by:  Nothing Ineffective treatments:  Position changes and sitting up Associated symptoms: no abdominal pain, no chest pain, no  cough, no fever, no PND, no sore throat, no vomiting and no wheezing   Risk factors: no prolonged immobilization and no tobacco use       Past Medical History:  Diagnosis Date  . Arthritis   . Asthma    as child  . Back pain   . CHF (congestive heart failure) (HCC)   . COPD (chronic obstructive pulmonary disease) (HCC)   . GERD (gastroesophageal reflux disease)   . Hernia   . History of hiatal hernia   . Hypogonadism in male   . Neuromuscular disorder (HCC)    back injury with surgery  . PONV (postoperative nausea and vomiting)    gets nausea  . Shortness of breath dyspnea   . Sleep apnea    Does not use CPAP, new equipment 12/2014  . Ulcer   . Wears glasses     Patient Active Problem List   Diagnosis Date Noted  . Acute diastolic heart failure (HCC) 06/11/2019  . Preoperative testing 09/13/2018  . AMS (altered mental status) 07/11/2018  . Fever   . Myoclonic jerking   . Obesity, morbid (more than 100 lbs over ideal weight or BMI > 40) (HCC) 06/30/2018  . DDD (degenerative disc disease), thoracic 06/30/2018  . Osteoarthritis of lumbar spine 06/30/2018  . History of acute alcoholic pancreatitis 06/30/2018  . Hypogonadism in male 06/30/2018  . Vitamin D deficiency 06/30/2018  . Chronic hyperglycemia 06/30/2018  . Prediabetes 06/30/2018  . Alcoholism (HCC) 06/18/2018  . Alcohol-induced  acute pancreatitis without infection or necrosis 06/18/2018  . Acute alcoholic pancreatitis 26/83/4196  . Hypokalemia 06/11/2018  . Hyponatremia 06/11/2018  . Chronic diastolic CHF (congestive heart failure) ([place]) 06/11/2018  . Acute pancreatitis 06/10/2018  . Nausea and vomiting 06/10/2018  . Abdominal pain 06/10/2018  . Encounter for screening colonoscopy 06/10/2018  . Acute CHF (congestive heart failure) ([place]) 05/29/2018  . Withdrawal from opioids ([place]) 05/27/2018  . Morbid obesity with BMI of 50.0-59.9, adult ([place]) 05/25/2018  . Secondary osteoarthritis of multiple sites  05/25/2018  . Osteoarthritis of hip (Right) 05/25/2018  . Encounter for adjustment or management of infusion pump 04/01/2018  . Lumbar spondylosis 01/26/2018  . Spondylosis without myelopathy or radiculopathy, lumbosacral region 12/02/2017  . Lumbar facet syndrome (Bilateral) (R>L) 12/02/2017  . Chronic low back pain (Primary Area of Pain) (Bilateral) (R>L) 12/02/2017  . Chronic knee pain after total replacement (Left) 12/01/2017  . Chronic musculoskeletal pain 11/17/2017  . Muscle spasticity 11/17/2017  . Gastroesophageal reflux disease without esophagitis 11/17/2017  . Neurogenic pain 11/17/2017  . Drug-induced nausea and vomiting 11/17/2017  . Therapeutic opioid-induced constipation (OIC) 11/17/2017  . Chronic pain syndrome 11/09/2017  . Long term prescription benzodiazepine use 11/09/2017  . Long term prescription opiate use 11/09/2017  . Oral Opiate use (60 MME/day) 11/09/2017  . Long-term use of high-risk medication 11/09/2017  . Pharmacologic therapy 11/09/2017  . Disorder of skeletal system 11/09/2017  . Problems influencing health status 11/09/2017  . Chronic nausea s/p bariatric surgery 11/09/2017  . Presence of intrathecal pump 11/09/2017  . Encounter for interrogation of infusion pump 11/09/2017  . Chronic foot pain (Right) 11/09/2017  . Chronic hip pain (Right) 11/09/2017  . Old tear of medial meniscus of knee (Left) 11/09/2017  . Failed back surgical syndrome 11/09/2017  . Chronic low back pain (Bilateral) w/ sciatica (Bilateral) 11/09/2017  . Chronic lower extremity pain (Secondary area of Pain) (Bilateral) (L>R) 11/09/2017  . Chronic lower extremity radicular pain 11/09/2017  . Chronic shoulder pain (Bilateral) 11/09/2017  . History of total knee replacement (Left) 11/09/2017  . History of arthroscopic surgery of shoulder (Bilateral) 11/09/2017  . S/P laparoscopic sleeve gastrectomy Sept 2016 01/08/2015  . Severe obesity (BMI >= 40) ([place]) 10/21/2014  . OSA  complicated by mild polycythemia  10/21/2014  . Polycythemia, secondary 10/21/2014  . Dyspnea 10/20/2014  . S/P rotator cuff repair (Bilateral) 08/18/2013  . DDD (degenerative disc disease), lumbosacral 10/20/2006    Past Surgical History:  Procedure Laterality Date  . BACK SURGERY    . JOINT REPLACEMENT     Left knee replacement  . LAPAROSCOPIC GASTRIC SLEEVE RESECTION N/A 01/08/2015   Procedure: LAPAROSCOPIC GASTRIC SLEEVE RESECTION;  Surgeon: Johnathan Hausen, MD;  Location: [facility];  Service: General;  Laterality: N/A;  . morphine pump    . SHOULDER ARTHROSCOPY WITH SUBACROMIAL DECOMPRESSION AND BICEP TENDON REPAIR Left 10/06/2013   Procedure: LEFT SHOULDER ARTHROSCOPY DEBRIDEMENT EXTENTSIVE,DISTAL CLAVICULECTOMY,DECOMPRESSION SUBACROMIAL PARTIAL ACROMIOPLASTY WITH ROTATOR CUFF REPAIR, and excision of CALCIUM DEPOSIT;  Surgeon: Ninetta Lights, MD;  Location: [place];  Service: Orthopedics;  Laterality: Left;  . SHOULDER ARTHROSCOPY WITH SUBACROMIAL DECOMPRESSION, ROTATOR CUFF REPAIR AND BICEP TENDON REPAIR Right 08/18/2013   Procedure: RIGHT SHOULDER ARTHROSCOPY WITH SUBACROMIAL DECOMPRESSION/PARTIAL ACROMIOPLASTY WITH CORACOACROMIAL RELEASE/DISTAL CLAVICULECTOMY/ ROTATOR CUFF REPAIR/DEBRIDEMENT EXTENTSIVE;  Surgeon: Ninetta Lights, MD;  Location: [facility];  Service: Orthopedics;  Laterality: Right;  ANESTHESIA: GENERAL, PRE/POST OP SCALENE  . TONSILLECTOMY  Family History  Problem Relation Age of Onset  . Diabetes Mother   . Pneumonia Father     Social History   Tobacco Use  . Smoking status: Never Smoker  . Smokeless tobacco: Current User    Types: Snuff  Substance Use Topics  . Alcohol use: Yes    Comment: 2-7 beers per day, starting in Oct 2019.  . Drug use: No    Home Medications Prior to Admission medications   Medication Sig Start Date End Date Taking? Authorizing Provider  acetaminophen (TYLENOL) 500 MG tablet Take 1,000 mg  by mouth every 6 (six) hours as needed for moderate pain.    [provider]  aspirin EC 81 MG EC tablet Take 1 tablet (81 mg total) by mouth daily. 06/04/18   Purohit, Salli QuarryShrey C, MD  bumetanide (BUMEX) 1 MG tablet Take 2 mg ( total of 2 tablets) in the morning and 1 mg ( total of 1 tablet)  In the evening 10/22/18   Parke PoissonAcharya, Gayatri A, MD  calcium carbonate (CALCIUM 600) 600 MG TABS tablet Take 1 tablet (600 mg total) by mouth 2 (two) times daily with a meal. 12/27/18 12/27/19  Delano MetzNaveira, Francisco, MD  Cholecalciferol (VITAMIN D3) 125 MCG (5000 UT) CAPS Take 1 capsule (5,000 Units total) by mouth daily with breakfast. Take along with calcium and magnesium. 12/27/18 12/27/19  Delano MetzNaveira, Francisco, MD  cyclobenzaprine (FLEXERIL) 10 MG tablet Take 1 tablet (10 mg total) by mouth at bedtime. 03/26/20 09/22/20  Delano MetzNaveira, Francisco, MD  ergocalciferol (VITAMIN D2) 1.25 MG (50000 UT) capsule Take 1 capsule (50,000 Units total) by mouth 2 (two) times a week. X 6 weeks. 10/25/18 02/28/19  Delano MetzNaveira, Francisco, MD  escitalopram (LEXAPRO) 10 MG tablet Take 10 mg by mouth daily.    [provider]  esomeprazole (NEXIUM) 40 MG capsule Take 1 capsule (40 mg total) by mouth daily. 12/27/18 12/27/19  Delano MetzNaveira, Francisco, MD  GNP MILK OF MAGNESIA 1200 MG/15ML suspension Take 5 mLs by mouth daily as needed for mild constipation.  05/05/18   [provider]  lubiprostone (AMITIZA) 24 MCG capsule Take 1 capsule (24 mcg total) by mouth 2 (two) times daily with a meal. Swallow the medication whole. Do not break or chew the medication. 03/27/19 09/23/19  Delano MetzNaveira, Francisco, MD  Magnesium Oxide 500 MG CAPS Take 1 capsule (500 mg total) by mouth 2 (two) times daily at 8 am and 10 pm. 12/27/18 12/27/19  Delano MetzNaveira, Francisco, MD  NON FORMULARY 1,268.8 mcg by Intrathecal route daily. IT pump Fentanyl 2,000.0 mcg/ml Bupivicaine 20.0 mg/ml Baclofen 200.0 mcg/ml 24 dose 1268.8 mcg/day 40 ml pump    [provider]    ondansetron (ZOFRAN-ODT) 8 MG disintegrating tablet Take 1 tablet (8 mg total) by mouth every 8 (eight) hours as needed for nausea or vomiting. 03/26/20 09/22/20  Delano MetzNaveira, Francisco, MD  Oxycodone HCl 10 MG TABS Take 1 tablet (10 mg total) by mouth every 6 (six) hours as needed. Must last 30 days 05/26/19 06/25/19  Delano MetzNaveira, Francisco, MD  Oxycodone HCl 10 MG TABS Take 1 tablet (10 mg total) by mouth every 6 (six) hours as needed. Must last 30 days 06/25/19 07/25/19  Delano MetzNaveira, Francisco, MD  Oxycodone HCl 10 MG TABS Take 1 tablet (10 mg total) by mouth every 6 (six) hours as needed. Must last 30 days 07/25/19 08/24/19  Delano MetzNaveira, Francisco, MD  pregabalin (LYRICA) 150 MG capsule Take 1 capsule (150 mg total) by mouth 3 (three) times daily. 03/26/20 09/22/20  [name], MD  tiZANidine (ZANAFLEX) 4 MG tablet Take 1 tablet (4 mg total) by mouth every 8 (eight) hours as needed for muscle spasms. 03/26/20 09/22/20  [name], MD    Allergies    Tape  Review of Systems   Review of Systems  Constitutional: Negative for chills, fatigue and fever.  HENT: Negative for congestion, rhinorrhea, sinus pressure and sore throat.   Eyes: Negative for visual disturbance.  Respiratory: Positive for shortness of breath. Negative for cough and wheezing.   Cardiovascular: Positive for leg swelling. Negative for chest pain, palpitations and PND.  Gastrointestinal: Negative for abdominal pain, diarrhea, nausea and vomiting.  Genitourinary: Negative for decreased urine volume, dysuria and frequency.  Musculoskeletal: Positive for back pain. Negative for myalgias.  Skin: Negative.   Neurological: Positive for tremors (bilaterl UE jerking).  Psychiatric/Behavioral: Negative for confusion.    Physical Exam Updated Vital Signs BP (!) 116/55   Pulse 65   Temp 99.3 F (37.4 C)   Resp 17   SpO2 98%   Physical Exam Vitals and nursing note reviewed.  Constitutional:      General: He is not in acute  distress.    Appearance: He is ill-appearing (chronically).     Comments: Pt appears older than stated age, laying down in the bed, on 6L [place].  HENT:     Head: Normocephalic and atraumatic.     Mouth/Throat:     Mouth: Mucous membranes are moist.  Eyes:     Conjunctiva/sclera: Conjunctivae normal.  Cardiovascular:     Rate and Rhythm: Normal rate and regular rhythm.     Heart sounds: Normal heart sounds.  Pulmonary:     Effort: Pulmonary effort is normal. No tachypnea, prolonged expiration or respiratory distress.     Comments: On 6L [place] with O2 saturation from 95-99% on RA. Pt with normal respiratory effort, speaking in full sentences on exam. No wheeze or decreased lung sounds. Abdominal:     General: Abdomen is flat. Bowel sounds are normal. There is distension (secondary to body habitus).     Palpations: Abdomen is soft.  Musculoskeletal:        General: Normal range of motion.     Right lower leg: Edema present.     Left lower leg: Edema present.  Skin:    General: Skin is warm and dry.  Neurological:     General: No focal deficit present.     Mental Status: He is alert and oriented to person, place, and time.  Psychiatric:        Mood and Affect: Mood normal.     ED Results / Procedures / Treatments   Labs (all labs ordered are listed, but only abnormal results are displayed) Labs Reviewed  CBC WITH DIFFERENTIAL/PLATELET - Abnormal; Notable for the following components:      Result Value   RBC 4.14 (*)    Hemoglobin 12.5 (*)    nRBC 0.9 (*)    All other components within normal limits  COMPREHENSIVE METABOLIC PANEL - Abnormal; Notable for the following components:   Chloride 95 (*)    CO2 34 (*)    Glucose, Bld 129 (*)    Calcium 8.4 (*)    Albumin 3.3 (*)    All other components within normal limits  SARS CORONAVIRUS 2 (TAT 6-24 HRS)  BRAIN NATRIURETIC PEPTIDE  HIV ANTIBODY (ROUTINE TESTING W REFLEX)  CBC  CREATININE, SERUM    EKG EKG  Interpretation  Date/Time:  Saturday  June 11 2019 09:27:08 EST Ventricular Rate:  72 PR Interval:    QRS Duration: 102 QT Interval:  393 QTC Calculation: 431 R Axis:   82 Text Interpretation: Sinus rhythm Low voltage, precordial leads Baseline wander in lead(s) I II aVR aVF V1 V2 V4 V5 V6 DISREGARD, NEEDS REPEAT DUE TO ARTIFACT Confirmed by Frederick Peers [phone]) on 06/11/2019 10:08:15 AM   Radiology DG Chest 2 View  Result Date: 06/11/2019 CLINICAL DATA:  Dyspnea. COPD. CHF. Hiatal hernia. Sleep apnea. Nonsmoker. EXAM: CHEST - 2 VIEW COMPARISON:  07/11/2018 FINDINGS: Both views are mildly degraded by patient size. Midline trachea. Mild cardiomegaly. No pleural effusion or pneumothorax. Pulmonary interstitial prominence. Numerous leads and wires project over the chest. IMPRESSION: Mild cardiomegaly and low lung volumes with mild limitations as above. Pulmonary interstitial prominence, at least partially felt to be due to low lung volumes and patient size. Mild pulmonary venous congestion or atypical infection (viral or mycoplasma) cannot be entirely excluded. If possible, PA radiograph may be informative. Electronically Signed   By: Jeronimo Greaves M.D.   On: 06/11/2019 10:47    Procedures Procedures (including critical care time)  Medications Ordered in ED Medications  acetaminophen (TYLENOL) tablet 1,000 mg (has no administration in time range)  aspirin EC tablet 81 mg (has no administration in time range)  Oxycodone HCl TABS 10 mg (has no administration in time range)  escitalopram (LEXAPRO) tablet 10 mg (has no administration in time range)  lubiprostone (AMITIZA) capsule 24 mcg (has no administration in time range)  pantoprazole (PROTONIX) EC tablet 40 mg (has no administration in time range)  ondansetron (ZOFRAN-ODT) disintegrating tablet 8 mg (has no administration in time range)  pregabalin (LYRICA) capsule 150 mg (has no administration in time range)  tiZANidine (ZANAFLEX)  tablet 4 mg (has no administration in time range)  Magnesium Oxide CAPS 500 mg (has no administration in time range)  heparin injection 5,000 Units (has no administration in time range)  furosemide (LASIX) injection 40 mg (40 mg Intravenous Given 06/11/19 1046)    ED Course  I have reviewed the triage vital signs and the nursing notes.  Pertinent labs & imaging results that were available during my care of the patient were reviewed by me and considered in my medical decision making (see chart for details).    MDM Rules/Calculators/A&P                      Mr. Duane is a 54 y.o. male with significant PMH of diastolic heart failure, COPD, OSA, and chronic back pain with intrathecal pump, who presents for progressive shortness of breath over the last month. He was initially hypoxic for EMS to the 50's and currently on 6L [place] saturating in the high 90's. On examination, pt comfortable with normal respiratory effort, speaking in full sentences, and without wheeze.  CXR limited secondary to patient size but showing pulmonary interstitial prominence (venous congestion vs atypical infection not excluded). He is non-compliant with outpatient diuresis and admission weight was 400lbs. Per chart review, pt was 375lbs on 04/05/2019 with prior weights ranging in the 370-380's.   Suspect pt's acute hypoxic respiratory failure is secondary to acute CHF exacerbation and obesity hypoventilation syndrome. No chest pain, hemodynamic instability, or EKG changes concerning for ACS or PE. Denies infectious symptoms or sick contacts, COVID test pending. Discussed case with hospitalist, who will admit for IV diuresis.   Final Clinical Impression(s) / ED Diagnoses Final diagnoses:  None  Rx / DC Orders ED Discharge Orders    None       Thom Chimes, MD 06/11/19 1220    Little, Ambrose Finland, MD 06/11/19 [phone]

## 2019-06-11 NOTE — H&P (Addendum)
History and Physical    [name] ERD:[id number] DOB: [birth year]  DOA: 06/11/2019 PCP: [name], MD  Patient coming from: Home   Chief Complaint: SOB and low Oxygen  HPI: [name] is a 54 y.o. male with medical history significant of diastolic heart failure, COPD, OSA and chronic back pain with intrathecal pump who presented to the emergency department complaining of shortness of breath and jerking arm movement.  Patient was brought by EMS who reported oxygen saturation of 50% and was placed on 6 L nasal cannula with improvement to the high 90s.  Patient report having progressive shortness of breath over the past month or so with increasing leg swelling.  Yesterday he noted that his arm was jerking which usually happen when he is oxygen is low, his wife measure his oxygen saturation which was in the high 50s.  He attempted to use CPAP to help with oxygen however he did not improve.  He has not been compliant with his medications due to getting up frequently to urinate, he is not compliant with diet either.  He does not use CPAP at night. He has noted about 20 pounds increase weight since January.  He also reports low-grade fever this morning at home.  He denies sick contacts, cough, nasal congestion, chest pain, weakness, loss of taste or smell, abdominal pain, nausea/vomiting and diarrhea.  ED Course: ED evaluation, EKG negative for ACS changes, chest x-ray shows vascular congestion, cardiomegaly ?  Atypical infection cannot be excluded.  BNP 97.  COVID-19 pending.  40 mg of IV Lasix.  6 L nasal cannula sat 97.  Review of Systems:    General: nofever chills or decrease in energy. + weight gain  HEENT: no hearing changes or sore throat. + blurry vision  Respiratory: no dyspnea, coughing, wheezing CV: no chest pain, no palpitations. + leg swelling  GI: no nausea, vomiting, abdominal pain, diarrhea, constipation.  GU: no dysuria, burning on urination, increased urinary  frequency, hematuria  Ext:. No deformities,  Neuro: no unilateral weakness, numbness, or tingling. + R hand tremors  Skin: No rashes, lesions or wounds. MSK: No muscle spasm, no deformity, no limitation of range of movement in spin Heme: No easy bruising.  Travel history: No recent long distant travel.   Past Medical History:  Diagnosis Date  . Arthritis   . Asthma    as child  . Back pain   . CHF (congestive heart failure) (HCC)   . COPD (chronic obstructive pulmonary disease) (HCC)   . GERD (gastroesophageal reflux disease)   . Hernia   . History of hiatal hernia   . Hypogonadism in male   . Neuromuscular disorder (HCC)    back injury with surgery  . PONV (postoperative nausea and vomiting)    gets nausea  . Shortness of breath dyspnea   . Sleep apnea    Does not use CPAP, new equipment 12/2014  . Ulcer   . Wears glasses     Past Surgical History:  Procedure Laterality Date  . BACK SURGERY    . JOINT REPLACEMENT     Left knee replacement  . LAPAROSCOPIC GASTRIC SLEEVE RESECTION N/A 01/08/2015   Procedure: LAPAROSCOPIC GASTRIC SLEEVE RESECTION;  Surgeon: Luretha Murphy, MD;  Location: [facility];  Service: General;  Laterality: N/A;  . morphine pump    . SHOULDER ARTHROSCOPY WITH SUBACROMIAL DECOMPRESSION AND BICEP TENDON REPAIR Left 10/06/2013   Procedure: LEFT SHOULDER ARTHROSCOPY DEBRIDEMENT EXTENTSIVE,DISTAL CLAVICULECTOMY,DECOMPRESSION SUBACROMIAL PARTIAL ACROMIOPLASTY WITH ROTATOR  CUFF REPAIR, and excision of CALCIUM DEPOSIT;  Surgeon: Loreta Ave, MD;  Location: [facility];  Service: Orthopedics;  Laterality: Left;  . SHOULDER ARTHROSCOPY WITH SUBACROMIAL DECOMPRESSION, ROTATOR CUFF REPAIR AND BICEP TENDON REPAIR Right 08/18/2013   Procedure: RIGHT SHOULDER ARTHROSCOPY WITH SUBACROMIAL DECOMPRESSION/PARTIAL ACROMIOPLASTY WITH CORACOACROMIAL RELEASE/DISTAL CLAVICULECTOMY/ ROTATOR CUFF REPAIR/DEBRIDEMENT EXTENTSIVE;  Surgeon: Loreta Ave, MD;  Location:  [facility];  Service: Orthopedics;  Laterality: Right;  ANESTHESIA: GENERAL, PRE/POST OP SCALENE  . TONSILLECTOMY       reports that he has never smoked. His smokeless tobacco use includes snuff. He reports current alcohol use. He reports that he does not use drugs.  Allergies  Allergen Reactions  . Tape Other (See Comments)    Blisters. Pt tolerates paper tape.      Family History  Problem Relation Age of Onset  . Diabetes Mother   . Pneumonia Father      Prior to Admission medications   Medication Sig Start Date End Date Taking? Authorizing Provider  acetaminophen (TYLENOL) 500 MG tablet Take 1,000 mg by mouth every 6 (six) hours as needed for moderate pain.    [provider]  aspirin EC 81 MG EC tablet Take 1 tablet (81 mg total) by mouth daily. 06/04/18   Purohit, Salli Quarry, MD  bumetanide (BUMEX) 1 MG tablet Take 2 mg ( total of 2 tablets) in the morning and 1 mg ( total of 1 tablet)  In the evening 10/22/18   Parke Poisson, MD  calcium carbonate (CALCIUM 600) 600 MG TABS tablet Take 1 tablet (600 mg total) by mouth 2 (two) times daily with a meal. 12/27/18 12/27/19  [name], MD  Cholecalciferol (VITAMIN D3) 125 MCG (5000 UT) CAPS Take 1 capsule (5,000 Units total) by mouth daily with breakfast. Take along with calcium and magnesium. 12/27/18 12/27/19  [name], MD  cyclobenzaprine (FLEXERIL) 10 MG tablet Take 1 tablet (10 mg total) by mouth at bedtime. 03/26/20 09/22/20  [name], MD  ergocalciferol (VITAMIN D2) 1.25 MG (50000 UT) capsule Take 1 capsule (50,000 Units total) by mouth 2 (two) times a week. X 6 weeks. 10/25/18 02/28/19  [name], MD  escitalopram (LEXAPRO) 10 MG tablet Take 10 mg by mouth daily.    [provider]  esomeprazole (NEXIUM) 40 MG capsule Take 1 capsule (40 mg total) by mouth daily. 12/27/18 12/27/19  [name], MD  GNP MILK OF MAGNESIA 1200 MG/15ML suspension Take 5 mLs by  mouth daily as needed for mild constipation.  05/05/18   [provider]  lubiprostone (AMITIZA) 24 MCG capsule Take 1 capsule (24 mcg total) by mouth 2 (two) times daily with a meal. Swallow the medication whole. Do not break or chew the medication. 03/27/19 09/23/19  [name], MD  Magnesium Oxide 500 MG CAPS Take 1 capsule (500 mg total) by mouth 2 (two) times daily at 8 am and 10 pm. 12/27/18 12/27/19  [name], MD  NON FORMULARY 1,268.8 mcg by Intrathecal route daily. IT pump Fentanyl 2,000.0 mcg/ml Bupivicaine 20.0 mg/ml Baclofen 200.0 mcg/ml 24 dose 1268.8 mcg/day 40 ml pump    [provider]  ondansetron (ZOFRAN-ODT) 8 MG disintegrating tablet Take 1 tablet (8 mg total) by mouth every 8 (eight) hours as needed for nausea or vomiting. 03/26/20 09/22/20  [name], MD  Oxycodone HCl 10 MG TABS Take 1 tablet (10 mg total) by mouth every 6 (six) hours as needed. Must last 30  days 05/26/19 06/25/19  [name], MD  Oxycodone HCl 10 MG TABS Take 1 tablet (10 mg total) by mouth every 6 (six) hours as needed. Must last 30 days 06/25/19 07/25/19  [name], MD  Oxycodone HCl 10 MG TABS Take 1 tablet (10 mg total) by mouth every 6 (six) hours as needed. Must last 30 days 07/25/19 08/24/19  [name], MD  pregabalin (LYRICA) 150 MG capsule Take 1 capsule (150 mg total) by mouth 3 (three) times daily. 03/26/20 09/22/20  [name], MD  tiZANidine (ZANAFLEX) 4 MG tablet Take 1 tablet (4 mg total) by mouth every 8 (eight) hours as needed for muscle spasms. 03/26/20 09/22/20  [name], MD    Physical Exam: Vitals:   06/11/19 1100 06/11/19 1130 06/11/19 1145 06/11/19 1200  BP: (!) 116/55 (!) 130/58  (!) 146/69  Pulse: 65 73 77 74  Resp: 17 19 15 14   Temp:      SpO2: 98% 98% 98% 99%     Constitutional: NAD, calm, comfortable.  Super obese Eyes: PERRL, lids and conjunctivae normal ENMT: Mucous membranes are moist.  Posterior pharynx clear of any exudate or lesions.Normal dentition.  Neck: normal, supple, no masses, no thyromegaly Respiratory: No respiratory distress, mild decrease lung sounds at the bases, few rales at bilateral bases, difficult auscultation due to body habitus.  Patient speaking in full sentences.  No wheezing. Cardiovascular: Regular rate and rhythm, no murmurs / rubs / gallops. No extremity edema. 2+ pedal pulses. No carotid bruits.  Abdomen: no tenderness, no masses palpated. No hepatosplenomegaly. Bowel sounds positive.  Musculoskeletal: no clubbing / cyanosis. No joint deformity upper and lower extremities. Good ROM, no contractures. Normal muscle tone.  Bilateral lower extremity edema 1-2+. Skin: no rashes, lesions, ulcers. No induration Neurologic: CN 2-12 grossly intact. Sensation intact, DTR normal. Strength 5/5 in all 4.  Psychiatric: Normal judgment and insight. Alert and oriented x 3. Normal mood.    Labs on Admission: I have personally reviewed following labs and imaging studies  CBC: Recent Labs  Lab 06/11/19 1013  WBC 7.6  NEUTROABS 5.5  HGB 12.5*  HCT 41.4  MCV 100.0  PLT 208   Basic Metabolic Panel: Recent Labs  Lab 06/11/19 1013  NA 135  K 5.0  CL 95*  CO2 34*  GLUCOSE 129*  BUN 14  CREATININE 1.00  CALCIUM 8.4*   GFR: CrCl cannot be calculated (Unknown ideal weight.). Liver Function Tests: Recent Labs  Lab 06/11/19 1013  AST 28  ALT 31  ALKPHOS 73  BILITOT 0.8  PROT 7.2  ALBUMIN 3.3*   No results for input(s): LIPASE, AMYLASE in the last 168 hours. No results for input(s): AMMONIA in the last 168 hours. Coagulation Profile: No results for input(s): INR, PROTIME in the last 168 hours. Cardiac Enzymes: No results for input(s): CKTOTAL, CKMB, CKMBINDEX, TROPONINI in the last 168 hours. BNP (last 3 results) No results for input(s): PROBNP in the last 8760 hours. HbA1C: No results for input(s): HGBA1C in the last 72 hours. CBG: No  results for input(s): GLUCAP in the last 168 hours. Lipid Profile: No results for input(s): CHOL, HDL, LDLCALC, TRIG, CHOLHDL, LDLDIRECT in the last 72 hours. Thyroid Function Tests: No results for input(s): TSH, T4TOTAL, FREET4, T3FREE, THYROIDAB in the last 72 hours. Anemia Panel: No results for input(s): VITAMINB12, FOLATE, FERRITIN, TIBC, IRON, RETICCTPCT in the last 72 hours. Urine analysis:    Component Value Date/Time   COLORURINE YELLOW 07/11/2018 1511  APPEARANCEUR CLEAR 07/11/2018 1511   LABSPEC 1.009 07/11/2018 1511   PHURINE 6.0 07/11/2018 1511   GLUCOSEU NEGATIVE 07/11/2018 1511   HGBUR NEGATIVE 07/11/2018 1511   BILIRUBINUR NEGATIVE 07/11/2018 1511   KETONESUR NEGATIVE 07/11/2018 1511   PROTEINUR NEGATIVE 07/11/2018 1511   UROBILINOGEN 0.2 08/13/2010 1206   NITRITE NEGATIVE 07/11/2018 1511   LEUKOCYTESUR NEGATIVE 07/11/2018 1511   Sepsis Labs: !!!!!!!!!!!!!!!!!!!!!!!!!!!!!!!!!!!!!!!!!!!! @LABRCNTIP (procalcitonin:4,lacticidven:4) )No results found for this or any previous visit (from the past 240 hour(s)).   Radiological Exams on Admission: DG Chest 2 View  Result Date: 06/11/2019 CLINICAL DATA:  Dyspnea. COPD. CHF. Hiatal hernia. Sleep apnea. Nonsmoker. EXAM: CHEST - 2 VIEW COMPARISON:  07/11/2018 FINDINGS: Both views are mildly degraded by patient size. Midline trachea. Mild cardiomegaly. No pleural effusion or pneumothorax. Pulmonary interstitial prominence. Numerous leads and wires project over the chest. IMPRESSION: Mild cardiomegaly and low lung volumes with mild limitations as above. Pulmonary interstitial prominence, at least partially felt to be due to low lung volumes and patient size. Mild pulmonary venous congestion or atypical infection (viral or mycoplasma) cannot be entirely excluded. If possible, PA radiograph may be informative. Electronically Signed   By: Abigail Miyamoto M.D.   On: 06/11/2019 10:47    EKG: Independently reviewed.  Normal sinus rhythm, low  voltage.  No acute ST segment changes.  Assessment/Plan Acute respiratory failure with hypoxia  Likely due to acute on chronic diastolic heart failure, low suspicious for COVID-19 however he is under investigation due to low-grade fever and shortness of breath.  Wean O2 as able to keep saturations above 92%.  Will place patient in observation.  Acute on chronic diastolic heart failure Due to noncompliance, clinically with pitting edema, hypoxia, SOB and CXR with increase in vascular congestion. BNP low due to obesity. Will start with IV Lasix 40 mg twice daily, will check renal function in the morning.  Will obtain echocardiogram.  Low-salt diet.  Daily weight monitoring.  OSA Chronic, noncompliant with CPAP.  Will order CPAP at night.  Chronic back pain  Chronic, stable continue home medication.  He has an intrathecal pump and oxycodone as needed.  COPD/obesity hypoventilation syndrome He is not on any inhalers.  Clinically does not look like COPD exacerbation.  We will add albuterol as needed.  GERD Chronic, continue home meds.  DVT prophylaxis: Heparin  Code Status: Full code  Family Communication: None at bedside Disposition Plan: Anticipate discharge to previous home environment.  Consults called: None Admission status: Telemetry, observation   Chipper Oman MD [facility] [email or web address]  06/11/2019, 12:28 PM

## 2019-06-11 NOTE — ED Triage Notes (Signed)
EMS reports from home, Pt woke up last night SOB with low Spo2. CPAP ineffective, initial Spo2 on scene reported 50's placed on nasal cannula and brought up to high 90's. Hx of COPD and uses CPAP sporadically.  BP 150/110 HR 80 RR 20 Spo2 95 Nasal canula @ 6ltrs CBG 210  20 LAC NS enroute

## 2019-06-12 ENCOUNTER — Observation Stay (HOSPITAL_COMMUNITY): Payer: 59

## 2019-06-12 DIAGNOSIS — I5033 Acute on chronic diastolic (congestive) heart failure: Secondary | ICD-10-CM

## 2019-06-12 DIAGNOSIS — Z7982 Long term (current) use of aspirin: Secondary | ICD-10-CM | POA: Diagnosis not present

## 2019-06-12 DIAGNOSIS — J449 Chronic obstructive pulmonary disease, unspecified: Secondary | ICD-10-CM | POA: Diagnosis present

## 2019-06-12 DIAGNOSIS — T502X6A Underdosing of carbonic-anhydrase inhibitors, benzothiadiazides and other diuretics, initial encounter: Secondary | ICD-10-CM | POA: Diagnosis present

## 2019-06-12 DIAGNOSIS — Z20822 Contact with and (suspected) exposure to covid-19: Secondary | ICD-10-CM | POA: Diagnosis present

## 2019-06-12 DIAGNOSIS — R739 Hyperglycemia, unspecified: Secondary | ICD-10-CM | POA: Diagnosis present

## 2019-06-12 DIAGNOSIS — K219 Gastro-esophageal reflux disease without esophagitis: Secondary | ICD-10-CM | POA: Diagnosis present

## 2019-06-12 DIAGNOSIS — J9621 Acute and chronic respiratory failure with hypoxia: Secondary | ICD-10-CM | POA: Diagnosis present

## 2019-06-12 DIAGNOSIS — Z833 Family history of diabetes mellitus: Secondary | ICD-10-CM | POA: Diagnosis not present

## 2019-06-12 DIAGNOSIS — E662 Morbid (severe) obesity with alveolar hypoventilation: Secondary | ICD-10-CM | POA: Diagnosis present

## 2019-06-12 DIAGNOSIS — Z9119 Patient's noncompliance with other medical treatment and regimen: Secondary | ICD-10-CM | POA: Diagnosis not present

## 2019-06-12 DIAGNOSIS — Z91138 Patient's unintentional underdosing of medication regimen for other reason: Secondary | ICD-10-CM | POA: Diagnosis not present

## 2019-06-12 DIAGNOSIS — G8929 Other chronic pain: Secondary | ICD-10-CM | POA: Diagnosis present

## 2019-06-12 DIAGNOSIS — J9601 Acute respiratory failure with hypoxia: Secondary | ICD-10-CM | POA: Diagnosis not present

## 2019-06-12 DIAGNOSIS — K581 Irritable bowel syndrome with constipation: Secondary | ICD-10-CM | POA: Diagnosis present

## 2019-06-12 DIAGNOSIS — M549 Dorsalgia, unspecified: Secondary | ICD-10-CM | POA: Diagnosis present

## 2019-06-12 DIAGNOSIS — Z6841 Body Mass Index (BMI) 40.0 and over, adult: Secondary | ICD-10-CM | POA: Diagnosis not present

## 2019-06-12 DIAGNOSIS — Z96652 Presence of left artificial knee joint: Secondary | ICD-10-CM | POA: Diagnosis present

## 2019-06-12 DIAGNOSIS — F329 Major depressive disorder, single episode, unspecified: Secondary | ICD-10-CM | POA: Diagnosis present

## 2019-06-12 DIAGNOSIS — Z79899 Other long term (current) drug therapy: Secondary | ICD-10-CM | POA: Diagnosis not present

## 2019-06-12 LAB — BASIC METABOLIC PANEL
Anion gap: 12 (ref 5–15)
BUN: 11 mg/dL (ref 6–20)
CO2: 35 mmol/L — ABNORMAL HIGH (ref 22–32)
Calcium: 8.7 mg/dL — ABNORMAL LOW (ref 8.9–10.3)
Chloride: 94 mmol/L — ABNORMAL LOW (ref 98–111)
Creatinine, Ser: 0.73 mg/dL (ref 0.61–1.24)
GFR calc Af Amer: >60 mL/min (ref >60)
GFR calc non Af Amer: >60 mL/min (ref >60)
Glucose, Bld: 112 mg/dL — ABNORMAL HIGH (ref 70–99)
Potassium: 3.7 mmol/L (ref 3.5–5.1)
Sodium: 141 mmol/L (ref 135–145)

## 2019-06-12 LAB — CBC
HCT: 46.5 % (ref 39.0–52.0)
Hemoglobin: 14.5 g/dL (ref 13.0–17.0)
MCH: 30.7 pg (ref 26.0–34.0)
MCHC: 31.2 g/dL (ref 30.0–36.0)
MCV: 98.5 fL (ref 80.0–100.0)
Platelets: 217 10*3/uL (ref 150–400)
RBC: 4.72 MIL/uL (ref 4.22–5.81)
RDW: 14.5 % (ref 11.5–15.5)
WBC: 5.7 10*3/uL (ref 4.0–10.5)
nRBC: 0.5 % — ABNORMAL HIGH (ref 0.0–0.2)

## 2019-06-12 LAB — ECHOCARDIOGRAM COMPLETE
Height: 71 in
Weight: 6444.8 oz

## 2019-06-12 LAB — HIV ANTIBODY (ROUTINE TESTING W REFLEX): HIV Screen 4th Generation wRfx: NONREACTIVE

## 2019-06-12 MED ORDER — FUROSEMIDE 10 MG/ML IJ SOLN
80.0000 mg | Freq: Two times a day (BID) | INTRAMUSCULAR | Status: DC
  Administered 2019-06-12 – 2019-06-13 (×3): 80 mg via INTRAVENOUS
  Filled 2019-06-12 (×3): qty 8

## 2019-06-12 MED ORDER — PERFLUTREN LIPID MICROSPHERE
1.0000 mL | INTRAVENOUS | Status: AC | PRN
  Administered 2019-06-12: 10:00:00 3 mL via INTRAVENOUS
  Filled 2019-06-12: qty 10

## 2019-06-12 NOTE — Progress Notes (Signed)
  Echocardiogram 2D Echocardiogram has been performed.  Celene Skeen 06/12/2019, 10:18 AM

## 2019-06-12 NOTE — Progress Notes (Signed)
Patient prefers not to wear CPAP tonight.

## 2019-06-12 NOTE — Progress Notes (Addendum)
PROGRESS NOTE    [name]  EVO:[id number] DOB: Sep 04, 1965 DOA: 06/11/2019 PCP: Antony Contras, MD   Brief Narrative: 54 y.o. male with medical history significant of diastolic heart failure, COPD, OSA and chronic back pain with intrathecal pump who presented to the emergency department complaining of shortness of breath and jerking arm movement.  Patient was brought by EMS who reported oxygen saturation of 50% and was placed on 6 L nasal cannula with improvement to the high 90s.  Patient report having progressive shortness of breath over the past month or so with increasing leg swelling.  Yesterday he noted that his arm was jerking which usually happen when he is oxygen is low, his wife measure his oxygen saturation which was in the high 50s.  He attempted to use CPAP to help with oxygen however he did not improve.  He has not been compliant with his medications due to getting up frequently to urinate, he is not compliant with diet either.  He does not use CPAP at night. He has noted about 20 pounds increase weight since January.  He also reports low-grade fever this morning at home.  He denies sick contacts, cough, nasal congestion, chest pain, weakness, loss of taste or smell, abdominal pain, nausea/vomiting and diarrhea.  ED Course: ED evaluation, EKG negative for ACS changes, chest x-ray shows vascular congestion, cardiomegaly ?  Atypical infection cannot be excluded.  BNP 97.  COVID-19 pending.  40 mg of IV Lasix.  6 L nasal cannula sat 97.  Assessment & Plan:   Principal Problem:   Acute respiratory failure with hypoxemia (HCC) Active Problems:   Acute diastolic heart failure (HCC)   Acute respiratory failure with hypoxia (HCC)   #1 acute diastolic CHF exacerbation secondary to noncompliance to diuretics and CPAP in the setting of morbid obesity, obstructive sleep apnea, obesity hypoventilation.  Patient admitted with complaints of shortness of breath with dyspnea on exertion  increased weight gain and chest x-ray consistent with pulmonary edema. He reports his dry weight is 333 and his weight has gone up to 407 unknown duration. He has chronic back pain and has intrathecal pump in place.  He quit taking diuretics as he had to get up multiple times to you urinate as his pain gets worse every time he moves. He was hypoxic in the high 50s when EMS picked him up. Official reading on his chest x-ray shows pulmonary interstitial prominence mild pulmonary venous congestion or atypical infection cannot be entirely excluded. Since patient does not have any infectious symptoms this is highly due to vascular congestion due to heart failure. Last echo in February 2020 with normal ejection fraction. Will repeat echo this admission. He is negative by 6 L, BNP is 97 pseudolow due to obesity. Check daily weights and I&O's He is 93% on 4 L of oxygen at rest.  Patient did not have oxygen prior to admission to hospital. He is on Bumex at home the dose was increased in July 2020 by his cardiologist Dr. Ileene Patrick diarrhea.  Bumex dose at home is 2 mg in the morning and 1 mg at night.  He was supposed to follow-up in 3 months which she has not. I have increased his Lasix to 80 mg twice a day since he is still volume overloaded with lower extremity edema and dependent on oxygen at 4 L Renal functions need to be monitored closely with increasing diuresis.  #2 history of obstructive sleep apnea we will place order for CPAP and  see how he tolerates since he is noncompliant with it at home. Covid negative  #3 COPD/obesity hypoventilation continue albuterol nebulizer  #4 chronic back pain patient has intrathecal pump and as needed oxycodone continue the same.  Continue Flexeril.  #5 GERD continue Nexium  #6 depression on Lexapro  #7 IBS/constipation on lubiprostone  #8 hyperglycemia check hemoglobin A1c blood sugar 129  Estimated body mass index is 56.18 kg/m as calculated from the  following:   Height as of this encounter: 5\' 11"  (1.803 m).   Weight as of this encounter: 182.7 kg.  DVT prophylaxis: Heparin Code Status: Full code Family Communication: None Disposition Plan: Patient came from home likely will be discharged to home once he is medically stable.  Barriers to discharge still with fluid overload and oxygen dependency which is new patient admitted with acute CHF exacerbation.  PT consult pending..   Consultants:   None  Procedures: None Antimicrobials: None  Subjective: Resting in bed with head end of the bed elevated has not gotten out of bed since he came to the hospital continues to have shortness of breath with dyspnea on exertion Denies any chest pain  Objective: Vitals:   06/11/19 2030 06/12/19 0034 06/12/19 0452 06/12/19 0652  BP: (!) 157/87   (!) 155/98  Pulse: 79   71  Resp: 20 15  20   Temp: 98.7 F (37.1 C)   98.2 F (36.8 C)  TempSrc: Oral   Oral  SpO2: 93%   93%  Weight:   (!) 182.7 kg   Height:        Intake/Output Summary (Last 24 hours) at 06/12/2019 0803 Last data filed at 06/12/2019 0600 Gross per 24 hour  Intake 0 ml  Output 6100 ml  Net -6100 ml   Filed Weights   06/11/19 1602 06/12/19 0452  Weight: (!) 184.8 kg (!) 182.7 kg    Examination:  General exam: Appears calm and comfortable  Respiratory system: Diminished breath sounds at the bases to auscultation. Respiratory effort normal. Cardiovascular system: S1 & S2 heard, RRR. No JVD, murmurs, rubs, gallops or clicks. No pedal edema. Gastrointestinal system: Abdomen is distended, soft and nontender. No organomegaly or masses felt. Normal bowel sounds heard. Central nervous system: Alert and oriented. No focal neurological deficits. Extremities: 2+ pitting edema skin: No rashes, lesions or ulcers Psychiatry: Judgement and insight appear normal. Mood & affect appropriate.     Data Reviewed: I have personally reviewed following labs and imaging  studies  CBC: Recent Labs  Lab 06/11/19 1013  WBC 7.6  NEUTROABS 5.5  HGB 12.5*  HCT 41.4  MCV 100.0  PLT 208   Basic Metabolic Panel: Recent Labs  Lab 06/11/19 1013  NA 135  K 5.0  CL 95*  CO2 34*  GLUCOSE 129*  BUN 14  CREATININE 1.00  CALCIUM 8.4*   GFR: Estimated Creatinine Clearance: 144.6 mL/min (by C-G formula based on SCr of 1 mg/dL). Liver Function Tests: Recent Labs  Lab 06/11/19 1013  AST 28  ALT 31  ALKPHOS 73  BILITOT 0.8  PROT 7.2  ALBUMIN 3.3*   No results for input(s): LIPASE, AMYLASE in the last 168 hours. No results for input(s): AMMONIA in the last 168 hours. Coagulation Profile: No results for input(s): INR, PROTIME in the last 168 hours. Cardiac Enzymes: No results for input(s): CKTOTAL, CKMB, CKMBINDEX, TROPONINI in the last 168 hours. BNP (last 3 results) No results for input(s): PROBNP in the last 8760 hours. HbA1C: No results for  input(s): HGBA1C in the last 72 hours. CBG: No results for input(s): GLUCAP in the last 168 hours. Lipid Profile: No results for input(s): CHOL, HDL, LDLCALC, TRIG, CHOLHDL, LDLDIRECT in the last 72 hours. Thyroid Function Tests: No results for input(s): TSH, T4TOTAL, FREET4, T3FREE, THYROIDAB in the last 72 hours. Anemia Panel: No results for input(s): VITAMINB12, FOLATE, FERRITIN, TIBC, IRON, RETICCTPCT in the last 72 hours. Sepsis Labs: No results for input(s): PROCALCITON, LATICACIDVEN in the last 168 hours.  Recent Results (from the past 240 hour(s))  SARS CORONAVIRUS 2 (TAT 6-24 HRS) Nasopharyngeal Nasopharyngeal Swab     Status: None   Collection Time: 06/11/19 10:32 AM   Specimen: Nasopharyngeal Swab  Result Value Ref Range Status   SARS Coronavirus 2 NEGATIVE NEGATIVE Final    Comment: (NOTE) SARS-CoV-2 target nucleic acids are NOT DETECTED. The SARS-CoV-2 RNA is generally detectable in upper and lower respiratory specimens during the acute phase of infection. Negative results do not  preclude SARS-CoV-2 infection, do not rule out co-infections with other pathogens, and should not be used as the sole basis for treatment or other patient management decisions. Negative results must be combined with clinical observations, patient history, and epidemiological information. The expected result is Negative. Fact Sheet for Patients: HairSlick.no Fact Sheet for Healthcare Providers: quierodirigir.com This test is not yet approved or cleared by the [place] FDA and  has been authorized for detection and/or diagnosis of SARS-CoV-2 by FDA under an Emergency Use Authorization (EUA). This EUA will remain  in effect (meaning this test can be used) for the duration of the COVID-19 declaration under Section 56 4(b)(1) of the Act, 21 U.S.C. section 360bbb-3(b)(1), unless the authorization is terminated or revoked sooner. Performed at [facility], [address], [place], [place] [address]          Radiology Studies: DG Chest 2 View  Result Date: 06/11/2019 CLINICAL DATA:  Dyspnea. COPD. CHF. Hiatal hernia. Sleep apnea. Nonsmoker. EXAM: CHEST - 2 VIEW COMPARISON:  07/11/2018 FINDINGS: Both views are mildly degraded by patient size. Midline trachea. Mild cardiomegaly. No pleural effusion or pneumothorax. Pulmonary interstitial prominence. Numerous leads and wires project over the chest. IMPRESSION: Mild cardiomegaly and low lung volumes with mild limitations as above. Pulmonary interstitial prominence, at least partially felt to be due to low lung volumes and patient size. Mild pulmonary venous congestion or atypical infection (viral or mycoplasma) cannot be entirely excluded. If possible, PA radiograph may be informative. Electronically Signed   By: Jeronimo Greaves M.D.   On: 06/11/2019 10:47        Scheduled Meds: . aspirin EC  81 mg Oral Daily  . escitalopram  20 mg Oral Daily  . furosemide  40 mg Intravenous Q12H   . heparin  5,000 Units Subcutaneous Q8H  . lubiprostone  24 mcg Oral BID WC  . magnesium oxide  400 mg Oral BID AC & HS  . pantoprazole  40 mg Oral Daily  . pregabalin  150 mg Oral TID   Continuous Infusions:   LOS: 0 days     Alwyn Ren, MD  06/12/2019, 8:03 AM

## 2019-06-13 LAB — BASIC METABOLIC PANEL
Anion gap: 11 (ref 5–15)
BUN: 13 mg/dL (ref 6–20)
CO2: 35 mmol/L — ABNORMAL HIGH (ref 22–32)
Calcium: 8.6 mg/dL — ABNORMAL LOW (ref 8.9–10.3)
Chloride: 91 mmol/L — ABNORMAL LOW (ref 98–111)
Creatinine, Ser: 0.84 mg/dL (ref 0.61–1.24)
GFR calc Af Amer: >60 mL/min (ref >60)
GFR calc non Af Amer: >60 mL/min (ref >60)
Glucose, Bld: 170 mg/dL — ABNORMAL HIGH (ref 70–99)
Potassium: 3.5 mmol/L (ref 3.5–5.1)
Sodium: 137 mmol/L (ref 135–145)

## 2019-06-13 LAB — HEMOGLOBIN A1C
Hgb A1c MFr Bld: 6.4 % — ABNORMAL HIGH (ref 4.8–5.6)
Mean Plasma Glucose: 136.98 mg/dL

## 2019-06-13 MED ORDER — BUMETANIDE 1 MG PO TABS: ORAL_TABLET | ORAL | 6 refills | Status: DC

## 2019-06-13 NOTE — Progress Notes (Signed)
SATURATION QUALIFICATIONS: (This note is used to comply with regulatory documentation for home oxygen)  Patient Saturations on Room Air at Rest = 99%  Patient Saturations on Room Air while Ambulating =95%   

## 2019-06-13 NOTE — Discharge Summary (Addendum)
Physician Discharge Summary  [name]:[id number] DOB: [birth year] DOA: 06/11/2019  PCP: Tally JoeSwayne, David, MD  Admit date: 06/11/2019 Discharge date: 06/13/2019  Admitted From: home Disposition: home Recommendations for Outpatient Follow-up:  1. Follow up with PCP in 1-2 weeks 2. Please obtain BMP/CBC in one week 3. Please follow up with cardiologist  Home Health:none Equipment/Devices:none  Discharge Condition:stable CODE STATUS:full Diet recommendation: cardiac Brief/Interim Summary:54 y.o.malewith medical history significant ofdiastolic heart failure, COPD, OSA and chronic back pain with intrathecal pump who presented to the emergency department complaining of shortness of breath and jerking arm movement. Patient was brought by EMS who reported oxygen saturation of 50% and was placed on 6 L nasal cannula with improvement to the high 90s. Patient report having progressive shortness of breath over the past month or so with increasing leg swelling. Yesterday he noted that his arm was jerking which usually happen when he is oxygen is low, his wife measure his oxygen saturation which was in the high 50s. He attempted to use CPAP to help with oxygen however he did not improve. He has not been compliant with his medications due to getting up frequently to urinate, he is not compliant with diet either. He does not use CPAP at night. He has noted about 20 pounds increase weight since January. He also reports low-grade fever this morning at home. He denies sick contacts, cough, nasal congestion, chest pain, weakness, loss of taste or smell, abdominal pain, nausea/vomiting and diarrhea.  ED Course:ED evaluation,EKG negative for ACS changes, chest x-ray shows vascular congestion, cardiomegaly?Atypical infection cannot be excluded. BNP 97. COVID-19 pending. 40 mg of IV Lasix. 6 L nasal cannula sat 97   Discharge Diagnoses:  Principal Problem:   Acute respiratory failure with  hypoxemia (HCC) Active Problems:   Acute diastolic heart failure (HCC)   Acute respiratory failure with hypoxia (HCC)   Acute on chronic respiratory failure with hypoxia (HCC)  #1 acute diastolic CHF exacerbation secondary to noncompliance to diuretics and CPAP in the setting of morbid obesity, obstructive sleep apnea, obesity hypoventilation.  Patient admitted with complaints of shortness of breath with dyspnea on exertion increased weight gain and chest x-ray consistent with pulmonary edema. He reports his dry weight is 333 and his weight has gone up to 407 unknown duration. He has chronic back pain and has intrathecal pump in place.  He quit taking diuretics as he had to get up multiple times to you urinate as his pain gets worse every time he moves. He was hypoxic in the high 50s when EMS picked him up. Official reading on his chest x-ray shows pulmonary interstitial prominence mild pulmonary venous congestion or atypical infection cannot be entirely excluded. Since patient does not have any infectious symptoms this is highly due to vascular congestion due to heart failure. Last echo in February 2020 with normal ejection fraction. repeat echo this admission normal ef He is negative by 8 L, BNP is 97 pseudolow due to obesity. Oxygen sat on dc 95% on room air. Bumex dose at home is 2 mg in the morning and 1 mg at night.   He was treated with Lasix to 80 mg twice a day  He will be discharged on Bumex 2 mg in the morning and 2 mg at night he is asked to follow-up with his PCP and cardiologist.  He is asked to do a BMP on 06/15/2019 to monitor his renal functions.  #2 history of obstructive sleep apnea -patient does not use his  CPAP at home.  He has 2 CPAP machines which he refuses to use as it is very uncomfortable and makes loud noises.   Covid negative  #3 COPD/obesity hypoventilation continue albuterol nebulizer  #4 chronic back pain patient has intrathecal pump and as needed oxycodone  continue the same.  Continue Flexeril.  #5 GERD continue Nexium  #6 depression on Lexapro  #7 IBS/constipation on lubiprostone  #8 hyperglycemia check hemoglobin A1c pending blood sugar 129   Estimated body mass index is 55.19 kg/m as calculated from the following:   Height as of this encounter: 5\' 11"  (1.803 m).   Weight as of this encounter: 179.5 kg.  Discharge Instructions   Allergies as of 06/13/2019      Reactions   Tape Other (See Comments)   Blisters - tolerates paper tape well      Medication List    TAKE these medications   acetaminophen 500 MG tablet Commonly known as: TYLENOL Take 1,000 mg by mouth every 6 (six) hours as needed for moderate pain.   aspirin 81 MG EC tablet Take 1 tablet (81 mg total) by mouth daily.   bumetanide 1 MG tablet Commonly known as: Bumex Take 2 mg ( total of 2 tablets) in the morning and 2 mg ( total of 2 tablets)  In the evening What changed: additional instructions   calcium carbonate 600 MG Tabs tablet Commonly known as: Calcium 600 Take 1 tablet (600 mg total) by mouth 2 (two) times daily with a meal.   cyclobenzaprine 10 MG tablet Commonly known as: FLEXERIL Take 1 tablet (10 mg total) by mouth at bedtime. Start taking on: March 26, 2020   ergocalciferol 1.25 MG (50000 UT) capsule Commonly known as: VITAMIN D2 Take 1 capsule (50,000 Units total) by mouth 2 (two) times a week. X 6 weeks.   escitalopram 20 MG tablet Commonly known as: LEXAPRO Take 20 mg by mouth daily.   esomeprazole 40 MG capsule Commonly known as: NEXIUM Take 1 capsule (40 mg total) by mouth daily. What changed: when to take this   GNP Milk of Magnesia 400 MG/5ML suspension Generic drug: magnesium hydroxide Take 5 mLs by mouth daily as needed for mild constipation.   lubiprostone 24 MCG capsule Commonly known as: AMITIZA Take 1 capsule (24 mcg total) by mouth 2 (two) times daily with a meal. Swallow the medication whole. Do not break  or chew the medication.   Magnesium Oxide 500 MG Caps Take 1 capsule (500 mg total) by mouth 2 (two) times daily at 8 am and 10 pm.   NON FORMULARY 1,268.8 mcg by Intrathecal route daily. IT pump Fentanyl 2,000.0 mcg/ml Bupivicaine 20.0 mg/ml Baclofen 200.0 mcg/ml 24 dose 1268.8 mcg/day 40 ml pump   ondansetron 8 MG disintegrating tablet Commonly known as: ZOFRAN-ODT Take 1 tablet (8 mg total) by mouth every 8 (eight) hours as needed for nausea or vomiting. Start taking on: March 26, 2020   Oxycodone HCl 10 MG Tabs Take 1 tablet (10 mg total) by mouth every 6 (six) hours as needed. Must last 30 days What changed: reasons to take this   Oxycodone HCl 10 MG Tabs Take 1 tablet (10 mg total) by mouth every 6 (six) hours as needed. Must last 30 days Start taking on: June 25, 2019 What changed: reasons to take this   Oxycodone HCl 10 MG Tabs Take 1 tablet (10 mg total) by mouth every 6 (six) hours as needed. Must last 30 days Start taking  on: July 25, 2019 What changed: reasons to take this   pregabalin 150 MG capsule Commonly known as: Lyrica Take 1 capsule (150 mg total) by mouth 3 (three) times daily. Start taking on: March 26, 2020   promethazine 25 MG tablet Commonly known as: PHENERGAN Take 25-50 mg by mouth every 6 (six) hours as needed for nausea or vomiting.   tiZANidine 4 MG tablet Commonly known as: Zanaflex Take 1 tablet (4 mg total) by mouth every 8 (eight) hours as needed for muscle spasms. Start taking on: March 26, 2020   Vitamin D3 125 MCG (5000 UT) Caps Take 1 capsule (5,000 Units total) by mouth daily with breakfast. Take along with calcium and magnesium.      Follow-up Information    [name], MD Follow up.   Specialty: Family Medicine Why: check bmp 3/3 Contact information: [address] [place] [address] [phone]        Parke Poisson, MD .   Specialties: Cardiology, Radiology Contact  information: [address] [place] [address] [place] [address] [phone]          Allergies  Allergen Reactions  . Tape Other (See Comments)    Blisters - tolerates paper tape well    Consultations: None Procedures/Studies: DG Chest 2 View  Result Date: 06/11/2019 CLINICAL DATA:  Dyspnea. COPD. CHF. Hiatal hernia. Sleep apnea. Nonsmoker. EXAM: CHEST - 2 VIEW COMPARISON:  07/11/2018 FINDINGS: Both views are mildly degraded by patient size. Midline trachea. Mild cardiomegaly. No pleural effusion or pneumothorax. Pulmonary interstitial prominence. Numerous leads and wires project over the chest. IMPRESSION: Mild cardiomegaly and low lung volumes with mild limitations as above. Pulmonary interstitial prominence, at least partially felt to be due to low lung volumes and patient size. Mild pulmonary venous congestion or atypical infection (viral or mycoplasma) cannot be entirely excluded. If possible, PA radiograph may be informative. Electronically Signed   By: Jeronimo Greaves M.D.   On: 06/11/2019 10:47   ECHOCARDIOGRAM COMPLETE  Result Date: 06/12/2019    ECHOCARDIOGRAM REPORT   Patient Name:   [name] Date of Exam: 06/12/2019 Medical Rec #:  [id number]       Height:       71.0 in Accession #:    [id number]      Weight:       402.8 lb Date of Birth:  1965-10-12       BSA:          2.839 m Patient Age:    53 years        BP:           155/98 mmHg Patient Gender: M               HR:           71 bpm. Exam Location:  Inpatient Procedure: 2D Echo Indications:    CHF 428.31  History:        Patient has prior history of Echocardiogram examinations, most                 recent 05/30/2018. CHF; COPD.  Sonographer:    Celene Skeen RDCS (AE) Referring Phys: [id number] EDWIN SILVA ZAPATA  Sonographer Comments: Patient is morbidly obese. Image acquisition challenging due to patient body habitus. IMPRESSIONS  1. Left ventricular ejection fraction, by estimation, is 60 to 65%. The left ventricle has normal  function. The left ventricle has no regional wall motion abnormalities. Left ventricular diastolic parameters  were normal.  2. Right ventricular systolic function is normal. The right ventricular size is normal.  3. The mitral valve is normal in structure and function. No evidence of mitral valve regurgitation. No evidence of mitral stenosis.  4. The aortic valve is normal in structure and function. Aortic valve regurgitation is not visualized. No aortic stenosis is present.  5. The inferior vena cava is normal in size with greater than 50% respiratory variability, suggesting right atrial pressure of 3 mmHg. FINDINGS  Left Ventricle: Left ventricular ejection fraction, by estimation, is 60 to 65%. The left ventricle has normal function. The left ventricle has no regional wall motion abnormalities. Definity contrast agent was given IV to delineate the left ventricular  endocardial borders. The left ventricular internal cavity size was normal in size. There is no left ventricular hypertrophy. Left ventricular diastolic parameters were normal. Normal left ventricular filling pressure. Right Ventricle: The right ventricular size is normal. No increase in right ventricular wall thickness. Right ventricular systolic function is normal. Left Atrium: Left atrial size was normal in size. Right Atrium: Right atrial size was normal in size. Pericardium: There is no evidence of pericardial effusion. Mitral Valve: The mitral valve is normal in structure and function. Normal mobility of the mitral valve leaflets. Mild mitral annular calcification. No evidence of mitral valve regurgitation. No evidence of mitral valve stenosis. Tricuspid Valve: The tricuspid valve is normal in structure. Tricuspid valve regurgitation is not demonstrated. No evidence of tricuspid stenosis. Aortic Valve: The aortic valve is normal in structure and function. Aortic valve regurgitation is not visualized. No aortic stenosis is present. Pulmonic Valve:  The pulmonic valve was normal in structure. Pulmonic valve regurgitation is not visualized. No evidence of pulmonic stenosis. Aorta: The aortic root is normal in size and structure. Venous: The inferior vena cava is normal in size with greater than 50% respiratory variability, suggesting right atrial pressure of 3 mmHg. IAS/Shunts: No atrial level shunt detected by color flow Doppler.  LEFT VENTRICLE PLAX 2D LVIDd:         5.60 cm  Diastology LVIDs:         4.00 cm  LV e' lateral:   12.40 cm/s LV PW:         1.30 cm  LV E/e' lateral: 7.8 LV IVS:        1.00 cm  LV e' medial:    12.00 cm/s LVOT diam:     2.30 cm  LV E/e' medial:  8.0 LV SV:         59 LV SV Index:   21 LVOT Area:     4.15 cm  LEFT ATRIUM         Index LA diam:    4.40 cm 1.55 cm/m  AORTIC VALVE LVOT Vmax:   84.20 cm/s LVOT Vmean:  59.100 cm/s LVOT VTI:    0.141 m  AORTA Ao Root diam: 3.60 cm MITRAL VALVE MV Area (PHT): 2.42 cm    SHUNTS MV Decel Time: 314 msec    Systemic VTI:  0.14 m MV E velocity: 96.40 cm/s  Systemic Diam: 2.30 cm MV A velocity: 92.10 cm/s MV E/A ratio:  1.05 Armanda Magic MD Electronically signed by Armanda Magic MD Signature Date/Time: 06/12/2019/1:31:13 PM    Final     (Echo, Carotid, EGD, Colonoscopy, ERCP)    Subjective: Awake alert anxious to go home sitting up by the side of the bed eating breakfast  Discharge Exam: Vitals:   06/12/19 2042 06/13/19  0449  BP: 136/66 (!) 152/89  Pulse: 74 77  Resp: 17 18  Temp: 98.3 F (36.8 C)   SpO2: 93% 98%   Vitals:   06/12/19 1249 06/12/19 2042 06/13/19 0449 06/13/19 0451  BP: 136/75 136/66 (!) 152/89   Pulse: 70 74 77   Resp: 19 17 18    Temp: 97.8 F (36.6 C) 98.3 F (36.8 C)    TempSrc: Oral Oral    SpO2: 96% 93% 98%   Weight:    (!) 179.5 kg  Height:        General: Pt is alert, awake, not in acute distress Cardiovascular: RRR, S1/S2 +, no rubs, no gallops Respiratory: Diminished breath sounds at the bases bilaterally, no wheezing, no  rhonchi Abdominal: Soft, NT, ND, bowel sounds + Extremities: no edema, no cyanosis    The results of significant diagnostics from this hospitalization (including imaging, microbiology, ancillary and laboratory) are listed below for reference.     Microbiology: Recent Results (from the past 240 hour(s))  SARS CORONAVIRUS 2 (TAT 6-24 HRS) Nasopharyngeal Nasopharyngeal Swab     Status: None   Collection Time: 06/11/19 10:32 AM   Specimen: Nasopharyngeal Swab  Result Value Ref Range Status   SARS Coronavirus 2 NEGATIVE NEGATIVE Final    Comment: (NOTE) SARS-CoV-2 target nucleic acids are NOT DETECTED. The SARS-CoV-2 RNA is generally detectable in upper and lower respiratory specimens during the acute phase of infection. Negative results do not preclude SARS-CoV-2 infection, do not rule out co-infections with other pathogens, and should not be used as the sole basis for treatment or other patient management decisions. Negative results must be combined with clinical observations, patient history, and epidemiological information. The expected result is Negative. Fact Sheet for Patients: HairSlick.no Fact Sheet for Healthcare Providers: quierodirigir.com This test is not yet approved or cleared by the [place] FDA and  has been authorized for detection and/or diagnosis of SARS-CoV-2 by FDA under an Emergency Use Authorization (EUA). This EUA will remain  in effect (meaning this test can be used) for the duration of the COVID-19 declaration under Section 56 4(b)(1) of the Act, 21 U.S.C. section 360bbb-3(b)(1), unless the authorization is terminated or revoked sooner. Performed at [facility], [address], [place], [place] [address]      Labs: BNP (last 3 results) Recent Labs    07/11/18 1648 06/11/19 1013  BNP 348.1* 97.0   Basic Metabolic Panel: Recent Labs  Lab 06/11/19 1013 06/12/19 0732  NA 135 141   K 5.0 3.7  CL 95* 94*  CO2 34* 35*  GLUCOSE 129* 112*  BUN 14 11  CREATININE 1.00 0.73  CALCIUM 8.4* 8.7*   Liver Function Tests: Recent Labs  Lab 06/11/19 1013  AST 28  ALT 31  ALKPHOS 73  BILITOT 0.8  PROT 7.2  ALBUMIN 3.3*   No results for input(s): LIPASE, AMYLASE in the last 168 hours. No results for input(s): AMMONIA in the last 168 hours. CBC: Recent Labs  Lab 06/11/19 1013 06/12/19 0732  WBC 7.6 5.7  NEUTROABS 5.5  --   HGB 12.5* 14.5  HCT 41.4 46.5  MCV 100.0 98.5  PLT 208 217   Cardiac Enzymes: No results for input(s): CKTOTAL, CKMB, CKMBINDEX, TROPONINI in the last 168 hours. BNP: Invalid input(s): POCBNP CBG: No results for input(s): GLUCAP in the last 168 hours. D-Dimer No results for input(s): DDIMER in the last 72 hours. Hgb A1c No results for input(s): HGBA1C in the last 72  hours. Lipid Profile No results for input(s): CHOL, HDL, LDLCALC, TRIG, CHOLHDL, LDLDIRECT in the last 72 hours. Thyroid function studies No results for input(s): TSH, T4TOTAL, T3FREE, THYROIDAB in the last 72 hours.  Invalid input(s): FREET3 Anemia work up No results for input(s): VITAMINB12, FOLATE, FERRITIN, TIBC, IRON, RETICCTPCT in the last 72 hours. Urinalysis    Component Value Date/Time   COLORURINE YELLOW 07/11/2018 1511   APPEARANCEUR CLEAR 07/11/2018 1511   LABSPEC 1.009 07/11/2018 1511   PHURINE 6.0 07/11/2018 1511   GLUCOSEU NEGATIVE 07/11/2018 1511   HGBUR NEGATIVE 07/11/2018 1511   BILIRUBINUR NEGATIVE 07/11/2018 1511   KETONESUR NEGATIVE 07/11/2018 1511   PROTEINUR NEGATIVE 07/11/2018 1511   UROBILINOGEN 0.2 08/13/2010 1206   NITRITE NEGATIVE 07/11/2018 1511   LEUKOCYTESUR NEGATIVE 07/11/2018 1511   Sepsis Labs Invalid input(s): PROCALCITONIN,  WBC,  LACTICIDVEN Microbiology Recent Results (from the past 240 hour(s))  SARS CORONAVIRUS 2 (TAT 6-24 HRS) Nasopharyngeal Nasopharyngeal Swab     Status: None   Collection Time: 06/11/19 10:32 AM    Specimen: Nasopharyngeal Swab  Result Value Ref Range Status   SARS Coronavirus 2 NEGATIVE NEGATIVE Final    Comment: (NOTE) SARS-CoV-2 target nucleic acids are NOT DETECTED. The SARS-CoV-2 RNA is generally detectable in upper and lower respiratory specimens during the acute phase of infection. Negative results do not preclude SARS-CoV-2 infection, do not rule out co-infections with other pathogens, and should not be used as the sole basis for treatment or other patient management decisions. Negative results must be combined with clinical observations, patient history, and epidemiological information. The expected result is Negative. Fact Sheet for Patients: HairSlick.no Fact Sheet for Healthcare Providers: quierodirigir.com This test is not yet approved or cleared by the [place] FDA and  has been authorized for detection and/or diagnosis of SARS-CoV-2 by FDA under an Emergency Use Authorization (EUA). This EUA will remain  in effect (meaning this test can be used) for the duration of the COVID-19 declaration under Section 56 4(b)(1) of the Act, 21 U.S.C. section 360bbb-3(b)(1), unless the authorization is terminated or revoked sooner. Performed at [facility], [address], [place], [place] [address]      Time coordinating discharge:  36 minutes  SIGNED:   Alwyn Ren, MD  [facility] 06/13/2019, 8:51 AM Pager   If 7PM-7AM, please contact night-coverage [email or web address] Password [email or web address]

## 2019-06-13 NOTE — Progress Notes (Signed)
PT Screen Note  Patient Details Name: [name] MRN: [id number] DOB: 11-30-1965   Spoke with RN and she reports pt is discharging soon.  States she had ambulated pt and monitored oxygen-which was stable on RA.  PT checked on pt, who was sitting EOB at arrival and dressed.  He reports at baseline and declined PT needs.  Will sign off at this time.  Royetta Asal, PT Acute Rehab Services Pager [phone] [facility] [phone] [facility] [phone]  Rayetta Humphrey 06/13/2019, 11:03 AM

## 2019-06-13 NOTE — Discharge Instructions (Signed)
Your bumex dose is increased to 2 mg twice daily Please make sure to check ur blood work bmp 3/3  Follow up with the cardiologist in one week Follow up with primary care dr in 1 week

## 2019-07-05 ENCOUNTER — Telehealth: Payer: Self-pay | Admitting: *Deleted

## 2019-07-05 NOTE — Telephone Encounter (Signed)
Called and left detailed message on patient 's phone   Office is switching  appointment to a an in office visit per Dr Jacques Navy to April 16 , 2021 at 9:40 am .    Address given  And instruction given if patient can not make appointment please call to reschedule other wise please come to appointment date that was given in this message .   "This is a post hospital heart failure patient - should be an in office follow up. Pls change virtual appt to in office.  Thanks,  GA "

## 2019-07-06 ENCOUNTER — Other Ambulatory Visit: Payer: Self-pay

## 2019-07-06 MED ORDER — PAIN MANAGEMENT IT PUMP REFILL: 1.0000 | Freq: Once | INTRATHECAL | 0 refills | Status: AC

## 2019-07-11 ENCOUNTER — Other Ambulatory Visit: Payer: Self-pay | Admitting: Pain Medicine

## 2019-07-11 DIAGNOSIS — G8929 Other chronic pain: Secondary | ICD-10-CM

## 2019-07-11 DIAGNOSIS — M7918 Myalgia, other site: Secondary | ICD-10-CM

## 2019-07-11 DIAGNOSIS — M62838 Other muscle spasm: Secondary | ICD-10-CM

## 2019-07-14 ENCOUNTER — Ambulatory Visit: Payer: Medicare Other | Admitting: Internal Medicine

## 2019-07-18 ENCOUNTER — Telehealth: Payer: Self-pay

## 2019-07-18 NOTE — Telephone Encounter (Signed)
No answer, left message to call our office to review meds for 07/20/19 appt.

## 2019-07-19 ENCOUNTER — Encounter: Payer: Self-pay | Admitting: Pain Medicine

## 2019-07-19 ENCOUNTER — Other Ambulatory Visit: Payer: Self-pay

## 2019-07-19 ENCOUNTER — Ambulatory Visit: Payer: No Typology Code available for payment source | Attending: Pain Medicine | Admitting: Pain Medicine

## 2019-07-19 VITALS — BP 119/74 | HR 98 | Temp 98.2°F | Resp 20 | Ht 71.0 in | Wt >= 6400 oz

## 2019-07-19 DIAGNOSIS — M5137 Other intervertebral disc degeneration, lumbosacral region: Secondary | ICD-10-CM | POA: Diagnosis present

## 2019-07-19 DIAGNOSIS — M5134 Other intervertebral disc degeneration, thoracic region: Secondary | ICD-10-CM | POA: Diagnosis present

## 2019-07-19 DIAGNOSIS — G8929 Other chronic pain: Secondary | ICD-10-CM | POA: Diagnosis present

## 2019-07-19 DIAGNOSIS — G894 Chronic pain syndrome: Secondary | ICD-10-CM

## 2019-07-19 DIAGNOSIS — M47816 Spondylosis without myelopathy or radiculopathy, lumbar region: Secondary | ICD-10-CM | POA: Insufficient documentation

## 2019-07-19 DIAGNOSIS — Z978 Presence of other specified devices: Secondary | ICD-10-CM | POA: Insufficient documentation

## 2019-07-19 DIAGNOSIS — M79605 Pain in left leg: Secondary | ICD-10-CM | POA: Insufficient documentation

## 2019-07-19 DIAGNOSIS — Z451 Encounter for adjustment and management of infusion pump: Secondary | ICD-10-CM

## 2019-07-19 DIAGNOSIS — M79604 Pain in right leg: Secondary | ICD-10-CM | POA: Diagnosis present

## 2019-07-19 DIAGNOSIS — M961 Postlaminectomy syndrome, not elsewhere classified: Secondary | ICD-10-CM | POA: Insufficient documentation

## 2019-07-19 DIAGNOSIS — M545 Low back pain: Secondary | ICD-10-CM | POA: Diagnosis present

## 2019-07-19 MED ORDER — OXYCODONE HCL 10 MG PO TABS: 10.0000 mg | ORAL_TABLET | Freq: Four times a day (QID) | ORAL | 0 refills | Status: DC | PRN

## 2019-07-19 NOTE — Progress Notes (Deleted)
Patient: [name]  Service Category: E/M  Provider: Gaspar Cola, MD  DOB: 02/11/1966  DOS: 07/20/2019  Location: Office  MRN: [id number]  Setting: Ambulatory outpatient  Referring Provider: Antony Contras, MD  Type: Established Patient  Specialty: Interventional Pain Management  PCP: Antony Contras, MD  Location: Remote location  Delivery: TeleHealth     Virtual Encounter - Pain Management PROVIDER NOTE: Information contained herein reflects review and annotations entered in association with encounter. Interpretation of such information and data should be left to medically-trained personnel. Information provided to patient can be located elsewhere in the medical record under "Patient Instructions". Document created using STT-dictation technology, any transcriptional errors that may result from process are unintentional.    Contact & Pharmacy Preferred: [phone] Home: [phone] (home) Mobile: [phone] (mobile) E-mail: [email or web address]  [facility] - [place], [place] - [address] [place] [address] [place] Phone: [phone] Fax: [phone]   Pre-screening  [name] offered "in-person" vs "virtual" encounter. He indicated preferring virtual for this encounter.   Reason COVID-19*  Social distancing based on CDC and AMA recommendations.   I contacted [name] on 07/20/2019 via telephone.      I clearly identified myself as 17/10/2019, MD. I verified that I was speaking with the correct person using two identifiers (Name: [name], and date of birth: [birth year]).  Consent I sought verbal advanced consent from 11/20/1965 for virtual visit interactions. I informed [name] of possible security and privacy concerns, risks, and limitations associated with providing "not-in-person" medical evaluation and management services. I also informed Mr. Chew of the availability of "in-person" appointments. Finally, I  informed him that there would be a charge for the virtual visit and that he could be  personally, fully or partially, financially responsible for it. [name] expressed understanding and agreed to proceed.   Historic Elements   [name] is a 54 y.o. year old, male patient evaluated today after his last contact with our practice on 07/19/2019. [name]  has a past medical history of Arthritis, Asthma, Back pain, CHF (congestive heart failure) ([place]), COPD (chronic obstructive pulmonary disease) ([place]), GERD (gastroesophageal reflux disease), Hernia, History of hiatal hernia, Hypogonadism in male, Neuromuscular disorder ([place]), PONV (postoperative nausea and vomiting), Shortness of breath dyspnea, Sleep apnea, Ulcer, and Wears glasses. He also  has a past surgical history that includes Back surgery; morphine pump; Tonsillectomy; Joint replacement; Shoulder arthroscopy with subacromial decompression, rotator cuff repair and bicep tendon repair (Right, 08/18/2013); Shoulder arthroscopy with subacromial decompression and bicep tendon repair (Left, 10/06/2013); and Laparoscopic gastric sleeve resection (N/A, 01/08/2015). [name] has a current medication list which includes the following prescription(s): acetaminophen, bumetanide, calcium carbonate, vitamin d3, [START ON 03/26/2020] cyclobenzaprine, escitalopram, esomeprazole, gnp milk of magnesia, lubiprostone, magnesium oxide, NON FORMULARY, [START ON 03/26/2020] ondansetron, oxycodone hcl, [START ON 07/25/2019] oxycodone hcl, [START ON 03/26/2020] pregabalin, promethazine, and [START ON 03/26/2020] tizanidine. He  reports that he has never smoked. His smokeless tobacco use includes snuff. He reports current alcohol use. He reports that he does not use drugs. [name] is allergic to tape.   HPI  Today, he is being contacted for medication management.  Pharmacotherapy Assessment  Analgesic: Oxycodone IR 10 mg, 1 tab PO q 6 hrs (40 mg/day of  oxycodone)+ 52.9 mcg/hr intrathecal PF-Fentanyl (1,268.8 mcg/day of Fentanyl) MME/day:53m/day(from oral medication)+ 126.96 mg/day (from intrathecal fentanyl) = 186.96MME/day([place]. 1.08MME/day/kg).   Monitoring:  [place] PMP: PDMP reviewed during this encounter.       Pharmacotherapy: No side-effects or adverse reactions reported. Compliance: No problems identified. Effectiveness: Clinically acceptable. Plan: Refer to "POC".  UDS:  Summary  Date Value Ref Range Status  06/30/2018 FINAL  Final    Comment:    ==================================================================== TOXASSURE SELECT 13 (MW) ==================================================================== Test                             Result       Flag       Units Drug Present and Declared for Prescription Verification   Oxycodone                      955          EXPECTED   ng/mg creat   Oxymorphone                    2975         EXPECTED   ng/mg creat   Noroxycodone                   1684         EXPECTED   ng/mg creat   Noroxymorphone                 596          EXPECTED   ng/mg creat    Sources of oxycodone are scheduled prescription medications.    Oxymorphone, noroxycodone, and noroxymorphone are expected    metabolites of oxycodone. Oxymorphone is also available as a    scheduled prescription medication. Drug Present not Declared for Prescription Verification   Morphine                       141          UNEXPECTED ng/mg creat    Potential sources of morphine include administration of codeine    or morphine, use of heroin, or ingestion of poppy seeds.   Fentanyl                       6            UNEXPECTED ng/mg creat   Norfentanyl                    48           UNEXPECTED ng/mg creat    Source of fentanyl is a scheduled prescription medication,    including IV, patch, and transmucosal formulations. Norfentanyl    is an expected metabolite of  fentanyl. ==================================================================== Test                      Result    Flag   Units      Ref Range   Creatinine              233              mg/dL      >=20 ==================================================================== Declared Medications:  The flagging and interpretation on this report are based on the  following declared medications.  Unexpected results may arise from  inaccuracies in the declared medications.  **Note: The testing scope of this panel includes these medications:  Oxycodone  **Note: The testing scope of this panel does not include following  reported  medications:  Potassium  Promethazine  Vitamin B1 (Thiamine) ==================================================================== For clinical consultation, please call [phone]. ====================================================================    Laboratory Chemistry Profile   Renal Lab Results  Component Value Date   BUN 13 06/13/2019   CREATININE 0.84 06/13/2019   LABCREA 115.35 06/11/2018   BCR 13 11/17/2017   GFR 96.99 10/20/2014   GFRAA >60 06/13/2019   GFRNONAA >60 06/13/2019     Hepatic Lab Results  Component Value Date   AST 28 06/11/2019   ALT 31 06/11/2019   ALBUMIN 3.3 (L) 06/11/2019   ALKPHOS 73 06/11/2019   LIPASE 21 07/11/2018   AMMONIA 50 (H) 07/11/2018     Electrolytes Lab Results  Component Value Date   NA 137 06/13/2019   K 3.5 06/13/2019   CL 91 (L) 06/13/2019   CALCIUM 8.6 (L) 06/13/2019   MG 2.1 07/11/2018   PHOS 2.3 (L) 07/11/2018     Bone Lab Results  Component Value Date   25OHVITD1 6.3 (L) 06/30/2018   25OHVITD2 <1.0 06/30/2018   25OHVITD3 6.1 06/30/2018   TESTOFREE 3.1 (L) 06/30/2018   TESTOSTERONE 22 (L) 06/30/2018     Inflammation (CRP: Acute Phase) (ESR: Chronic Phase) Lab Results  Component Value Date   CRP 1.8 (H) 07/11/2018   ESRSEDRATE 41 (H) 11/17/2017   LATICACIDVEN 2.2 ([place]) 07/11/2018        Note: Above Lab results reviewed.  Imaging  ECHOCARDIOGRAM COMPLETE    ECHOCARDIOGRAM REPORT       Patient Name:   [name] Date of Exam: 06/12/2019 Medical Rec #:  [id number]       Height:       71.0 in Accession #:    [id number]      Weight:       402.8 lb Date of Birth:  June 08, 1965       BSA:          2.839 m Patient Age:    69 years        BP:           155/98 mmHg Patient Gender: M               HR:           71 bpm. Exam Location:  Inpatient  Procedure: 2D Echo  Indications:    CHF 428.31   History:        Patient has prior history of Echocardiogram examinations, most                 recent 05/30/2018. CHF; COPD.   Sonographer:    Jannett Celestine RDCS (AE) Referring Phys: [id number] EDWIN SILVA ZAPATA    Sonographer Comments: Patient is morbidly obese. Image acquisition challenging due to patient body habitus. IMPRESSIONS   1. Left ventricular ejection fraction, by estimation, is 60 to 65%. The left ventricle has normal function. The left ventricle has no regional wall motion abnormalities. Left ventricular diastolic parameters were normal.  2. Right ventricular systolic function is normal. The right ventricular size is normal.  3. The mitral valve is normal in structure and function. No evidence of mitral valve regurgitation. No evidence of mitral stenosis.  4. The aortic valve is normal in structure and function. Aortic valve regurgitation is not visualized. No aortic stenosis is present.  5. The inferior vena cava is normal in size with greater than 50% respiratory variability, suggesting right atrial pressure of 3 mmHg.  FINDINGS  Left Ventricle: Left ventricular ejection fraction, by  estimation, is 60 to 65%. The left ventricle has normal function. The left ventricle has no regional wall motion abnormalities. Definity contrast agent was given IV to delineate the left ventricular  endocardial borders. The left ventricular internal cavity size was normal in  size. There is no left ventricular hypertrophy. Left ventricular diastolic parameters were normal. Normal left ventricular filling pressure.  Right Ventricle: The right ventricular size is normal. No increase in right ventricular wall thickness. Right ventricular systolic function is normal.  Left Atrium: Left atrial size was normal in size.  Right Atrium: Right atrial size was normal in size.  Pericardium: There is no evidence of pericardial effusion.  Mitral Valve: The mitral valve is normal in structure and function. Normal mobility of the mitral valve leaflets. Mild mitral annular calcification. No evidence of mitral valve regurgitation. No evidence of mitral valve stenosis.  Tricuspid Valve: The tricuspid valve is normal in structure. Tricuspid valve regurgitation is not demonstrated. No evidence of tricuspid stenosis.  Aortic Valve: The aortic valve is normal in structure and function. Aortic valve regurgitation is not visualized. No aortic stenosis is present.  Pulmonic Valve: The pulmonic valve was normal in structure. Pulmonic valve regurgitation is not visualized. No evidence of pulmonic stenosis.  Aorta: The aortic root is normal in size and structure.  Venous: The inferior vena cava is normal in size with greater than 50% respiratory variability, suggesting right atrial pressure of 3 mmHg.  IAS/Shunts: No atrial level shunt detected by color flow Doppler.    LEFT VENTRICLE PLAX 2D LVIDd:         5.60 cm  Diastology LVIDs:         4.00 cm  LV e' lateral:   12.40 cm/s LV PW:         1.30 cm  LV E/e' lateral: 7.8 LV IVS:        1.00 cm  LV e' medial:    12.00 cm/s LVOT diam:     2.30 cm  LV E/e' medial:  8.0 LV SV:         59 LV SV Index:   21 LVOT Area:     4.15 cm    LEFT ATRIUM         Index LA diam:    4.40 cm 1.55 cm/m  AORTIC VALVE LVOT Vmax:   84.20 cm/s LVOT Vmean:  59.100 cm/s LVOT VTI:    0.141 m   AORTA Ao Root diam: 3.60 cm  MITRAL VALVE MV Area  (PHT): 2.42 cm    SHUNTS MV Decel Time: 314 msec    Systemic VTI:  0.14 m MV E velocity: 96.40 cm/s  Systemic Diam: 2.30 cm MV A velocity: 92.10 cm/s MV E/A ratio:  1.05  Fransico Him MD Electronically signed by Fransico Him MD Signature Date/Time: 06/12/2019/1:31:13 PM      Final    Assessment  There were no encounter diagnoses.  Plan of Care  Problem-specific:  No problem-specific Assessment & Plan notes found for this encounter.  [name] has a current medication list which includes the following long-term medication(s): bumetanide, calcium carbonate, vitamin d3, [START ON 03/26/2020] cyclobenzaprine, esomeprazole, lubiprostone, magnesium oxide, [START ON 03/26/2020] ondansetron, oxycodone hcl, [START ON 07/25/2019] oxycodone hcl, [START ON 03/26/2020] pregabalin, and [START ON 03/26/2020] tizanidine.  Pharmacotherapy (Medications Ordered): No orders of the defined types were placed in this encounter.  Orders:  No orders of the defined types were placed in this encounter.  Follow-up plan:  No follow-ups on file.      Interventional management options: Considering: NOTE: NO RFA until BMI <35  Palliative/therapeuticintrathecal pump refill and maintenance Diagnostic left Genicular NB Possible left Genicular nerve RFA Possible bilateral lumbar facet RFA(NO Lumbar RFA until BMI <35) Diagnostic caudal ESI+epidurogram Possible Racz procedure Diagnostic bilateral IA shoulder joint injection Diagnostic bilateral suprascapular NB Possible bilateral suprascapular nerve RFA Diagnostic LESI vs TFESI   Palliative PRN treatment(s): Palliative/therapeuticintrathecal pump refill and maintenance Diagnostic bilateral lumbar facet block #2      Recent Visits Date Type Provider Dept  05/26/19 Procedure visit Milinda Pointer, MD [facility]  Showing recent visits within past 90 days and meeting all other requirements   Today's  Visits Date Type Provider Dept  07/19/19 Appointment Milinda Pointer, MD [facility]  Showing today's visits and meeting all other requirements   Future Appointments Date Type Provider Dept  07/20/19 Appointment Milinda Pointer, MD [facility]  Showing future appointments within next 90 days and meeting all other requirements   I discussed the assessment and treatment plan with the patient. The patient was provided an opportunity to ask questions and all were answered. The patient agreed with the plan and demonstrated an understanding of the instructions.  Patient advised to call back or seek an in-person evaluation if the symptoms or condition worsens.  Duration of encounter: *** minutes.  Note by: Gaspar Cola, MD Date: 07/20/2019; Time: 6:25 AM

## 2019-07-19 NOTE — Progress Notes (Signed)
PROVIDER NOTE: Information contained herein reflects review and annotations entered in association with encounter. Interpretation of such information and data should be left to medically-trained personnel. Information provided to patient can be located elsewhere in the medical record under "Patient Instructions". Document created using STT-dictation technology, any transcriptional errors that may result from process are unintentional.    Patient: [name]  Service Category: Procedure  Provider: Gaspar Cola, MD  DOB: 1965/04/29  DOS: 07/19/2019  Location: [facility]  MRN: [id number]  Setting: Ambulatory - outpatient  Referring Provider: Antony Contras, MD  Type: Established Patient  Specialty: Interventional Pain Management  PCP: Brian Contras, MD   Primary Reason for Visit: Interventional Pain Management Treatment. CC: Back Pain (lower)  Procedure:          Intrathecal Drug Delivery System (IDDS):  Type: Reservoir Refill [phone])       Region: Abdominal Laterality: Left  Type of Pump: Medtronic Synchromed II Delivery Route: Intrathecal Type of Pain Treated: Neuropathic/Nociceptive Primary Medication Class: Opioid/opiate  Medication, Concentration, Infusion Program, & Delivery Rate: Please see scanned programming printout.   Indications: 1. Failed back surgical syndrome   2. DDD (degenerative disc disease), lumbosacral   3. DDD (degenerative disc disease), thoracic   4. Chronic low back pain (Primary Area of Pain) (Bilateral) (R>L)   5. Chronic lower extremity pain (Secondary area of Pain) (Bilateral) (L>R)   6. Lumbar facet syndrome (Bilateral) (R>L)   7. Presence of intrathecal pump   8. Encounter for adjustment or management of infusion pump    Pain Assessment: Self-Reported Pain Score: 5 /10             Reported level is compatible with observation.        Note: The patient returns to the clinic today after last having had baclofen 200 mcg/mL added to  his intrathecal pump.  Pharmacotherapy Assessment  Analgesic: Oxycodone IR 10 mg, 1 tab PO q 6 hrs (40 mg/day of oxycodone)+ 52.9 mcg/hr intrathecal PF-Fentanyl (1,268.8 mcg/day of Fentanyl) MME/day:60m/day(from oral medication)+ 126.96 mg/day (from intrathecal fentanyl) = 186.96MME/day(Aprox. 1.08MME/day/kg).   Monitoring: [place] PMP: PDMP reviewed during this encounter.       Pharmacotherapy: No side-effects or adverse reactions reported. Compliance: No problems identified. Effectiveness: Clinically acceptable. Plan: Refer to "POC".  UDS:  Summary  Date Value Ref Range Status  06/30/2018 FINAL  Final    Comment:    ==================================================================== TOXASSURE SELECT 13 (MW) ==================================================================== Test                             Result       Flag       Units Drug Present and Declared for Prescription Verification   Oxycodone                      955          EXPECTED   ng/mg creat   Oxymorphone                    2975         EXPECTED   ng/mg creat   Noroxycodone                   1684         EXPECTED   ng/mg creat   Noroxymorphone  596          EXPECTED   ng/mg creat    Sources of oxycodone are scheduled prescription medications.    Oxymorphone, noroxycodone, and noroxymorphone are expected    metabolites of oxycodone. Oxymorphone is also available as a    scheduled prescription medication. Drug Present not Declared for Prescription Verification   Morphine                       141          UNEXPECTED ng/mg creat    Potential sources of morphine include administration of codeine    or morphine, use of heroin, or ingestion of poppy seeds.   Fentanyl                       6            UNEXPECTED ng/mg creat   Norfentanyl                    48           UNEXPECTED ng/mg creat    Source of fentanyl is a scheduled prescription medication,    including IV, patch, and transmucosal  formulations. Norfentanyl    is an expected metabolite of fentanyl. ==================================================================== Test                      Result    Flag   Units      Ref Range   Creatinine              233              mg/dL      >=20 ==================================================================== Declared Medications:  The flagging and interpretation on this report are based on the  following declared medications.  Unexpected results may arise from  inaccuracies in the declared medications.  **Note: The testing scope of this panel includes these medications:  Oxycodone  **Note: The testing scope of this panel does not include following  reported medications:  Potassium  Promethazine  Vitamin B1 (Thiamine) ==================================================================== For clinical consultation, please call [phone]. ====================================================================    Intrathecal Pump Therapy Assessment  Manufacturer: Medtronic Synchromed Type: Programmable Volume: 40 mL reservoir MRI compatibility: Yes   Drug content:  Primary Medication Class: Opioid Primary Medication: PF-Fentanyl Secondary Medication: see pump readout Other Medication: see pump readout   Programming:  Type: Simple continuous. See pump readout for details.   Changes:  Medication Change: None at this point Rate Change: No change in rate  Reported side-effects or adverse reactions: None reported  Effectiveness: Described as relatively effective, allowing for increase in activities of daily living (ADL) Clinically meaningful improvement in function (CMIF): Sustained CMIF goals met  Plan: Pump refill today  Pre-op Assessment:  [name] is a 54 y.o. (year old), male patient, seen today for interventional treatment. He  has a past surgical history that includes Back surgery; morphine pump; Tonsillectomy; Joint replacement; Shoulder  arthroscopy with subacromial decompression, rotator cuff repair and bicep tendon repair (Right, 08/18/2013); Shoulder arthroscopy with subacromial decompression and bicep tendon repair (Left, 10/06/2013); and Laparoscopic gastric sleeve resection (N/A, 01/08/2015). Mr. Darco has a current medication list which includes the following prescription(s): acetaminophen, bumetanide, calcium carbonate, vitamin d3, [START ON 03/26/2020] cyclobenzaprine, escitalopram, esomeprazole, gnp milk of magnesia, lubiprostone, magnesium oxide, NON FORMULARY, [START ON 03/26/2020] ondansetron, [START ON 07/25/2019] oxycodone hcl, [START ON 03/26/2020]  pregabalin, promethazine, [START ON 03/26/2020] tizanidine, [START ON 08/24/2019] oxycodone hcl, and [START ON 09/23/2019] oxycodone hcl. His primarily concern today is the Back Pain (lower)  Initial Vital Signs:  Pulse/HCG Rate: 98  Temp: 98.2 F (36.8 C) Resp: 20 BP: 119/74 SpO2: 96 %  BMI: Estimated body mass index is 59.97 kg/m as calculated from the following:   Height as of this encounter: '5\' 11"'  (1.803 m).   Weight as of this encounter: 430 lb (195 kg).  Risk Assessment: Allergies: Reviewed. He is allergic to tape.  Allergy Precautions: None required Coagulopathies: Reviewed. None identified.  Blood-thinner therapy: None at this time Active Infection(s): Reviewed. None identified. Mr. Dirr is afebrile  Site Confirmation: Mr. Peschke was asked to confirm the procedure and laterality before marking the site Procedure checklist: Completed Consent: Before the procedure and under the influence of no sedative(s), amnesic(s), or anxiolytics, the patient was informed of the treatment options, risks and possible complications. To fulfill our ethical and legal obligations, as recommended by the American Medical Association's Code of Ethics, I have informed the patient of my clinical impression; the nature and purpose of the treatment or procedure; the risks, benefits,  and possible complications of the intervention; the alternatives, including doing nothing; the risk(s) and benefit(s) of the alternative treatment(s) or procedure(s); and the risk(s) and benefit(s) of doing nothing.  Mr. Prosser was provided with information about the general risks and possible complications associated with most interventional procedures. These include, but are not limited to: failure to achieve desired goals, infection, bleeding, organ or nerve damage, allergic reactions, paralysis, and/or death.  In addition, he was informed of those risks and possible complications associated to this particular procedure, which include, but are not limited to: damage to the implant; failure to decrease pain; local, systemic, or serious CNS infections, intraspinal abscess with possible cord compression and paralysis, or life-threatening such as meningitis; bleeding; organ damage; nerve injury or damage with subsequent sensory, motor, and/or autonomic system dysfunction, resulting in transient or permanent pain, numbness, and/or weakness of one or several areas of the body; allergic reactions, either minor or major life-threatening, such as anaphylactic or anaphylactoid reactions.  Furthermore, Mr. Delis was informed of those risks and complications associated with the medications. These include, but are not limited to: allergic reactions (i.e.: anaphylactic or anaphylactoid reactions); endorphine suppression; bradycardia and/or hypotension; water retention and/or peripheral vascular relaxation leading to lower extremity edema and possible stasis ulcers; respiratory depression and/or shortness of breath; decreased metabolic rate leading to weight gain; swelling or edema; medication-induced neural toxicity; particulate matter embolism and blood vessel occlusion with resultant organ, and/or nervous system infarction; and/or intrathecal granuloma formation with possible spinal cord compression and permanent  paralysis.  Before refilling the pump Mr. Raimondi was informed that some of the medications used in the devise may not be FDA approved for such use and therefore it constitutes an off-label use of the medications.  Finally, he was informed that Medicine is not an exact science; therefore, there is also the possibility of unforeseen or unpredictable risks and/or possible complications that may result in a catastrophic outcome. The patient indicated having understood very clearly. We have given the patient no guarantees and we have made no promises. Enough time was given to the patient to ask questions, all of which were answered to the patient's satisfaction. Mr. Wong has indicated that he wanted to continue with the procedure. Attestation: I, the ordering provider, attest that I have discussed with the patient the benefits,  risks, side-effects, alternatives, likelihood of achieving goals, and potential problems during recovery for the procedure that I have provided informed consent. Date  Time: 07/19/2019 11:13 AM  Pre-Procedure Preparation:  Monitoring: As per clinic protocol. Respiration, ETCO2, SpO2, BP, heart rate and rhythm monitor placed and checked for adequate function Safety Precautions: Patient was assessed for positional comfort and pressure points before starting the procedure. Time-out: I initiated and conducted the "Time-out" before starting the procedure, as per protocol. The patient was asked to participate by confirming the accuracy of the "Time Out" information. Verification of the correct person, site, and procedure were performed and confirmed by me, the nursing staff, and the patient. "Time-out" conducted as per Joint Commission's Universal Protocol (UP.01.01.01). Time: 1108  Description of Procedure:          Position: Supine Target Area: Central-port of intrathecal pump. Approach: Anterior, 90 degree angle approach. Area Prepped: Entire Area around the pump  implant. DuraPrep (Iodine Povacrylex [0.7% available iodine] and Isopropyl Alcohol, 74% w/w) Safety Precautions: Aspiration looking for blood return was conducted prior to all injections. At no point did we inject any substances, as a needle was being advanced. No attempts were made at seeking any paresthesias. Safe injection practices and needle disposal techniques used. Medications properly checked for expiration dates. SDV (single dose vial) medications used. Description of the Procedure: Protocol guidelines were followed. Two nurses trained to do implant refills were present during the entire procedure. The refill medication was checked by both healthcare providers as well as the patient. The patient was included in the "Time-out" to verify the medication. The patient was placed in position. The pump was identified. The area was prepped in the usual manner. The sterile template was positioned over the pump, making sure the side-port location matched that of the pump. Both, the pump and the template were held for stability. The needle provided in the Medtronic Kit was then introduced thru the center of the template and into the central port. The pump content was aspirated and discarded volume documented. The new medication was slowly infused into the pump, thru the filter, making sure to avoid overpressure of the device. The needle was then removed and the area cleansed, making sure to leave some of the prepping solution back to take advantage of its long term bactericidal properties. The pump was interrogated and programmed to reflect the correct medication, volume, and dosage. The program was printed and taken to the physician for approval. Once checked and signed by the physician, a copy was provided to the patient and another scanned into the EMR. Vitals:   07/19/19 1111  BP: 119/74  Pulse: 98  Resp: 20  Temp: 98.2 F (36.8 C)  TempSrc: Temporal  SpO2: 96%  Weight: (!) 430 lb (195 kg)  Height:  '5\' 11"'  (1.803 m)    Start Time: 0117 hrs. End Time: 1130 hrs. Materials & Medications: Medtronic Refill Kit Medication(s): Please see chart orders for details.  Imaging Guidance:          Type of Imaging Technique: None used Indication(s): N/A Exposure Time: No patient exposure Contrast: None used. Fluoroscopic Guidance: N/A Ultrasound Guidance: N/A Interpretation: N/A  Antibiotic Prophylaxis:   Anti-infectives (From admission, onward)   None     Indication(s): None identified  Post-operative Assessment:  Post-procedure Vital Signs:  Pulse/HCG Rate: 98  Temp: 98.2 F (36.8 C) Resp: 20 BP: 119/74 SpO2: 96 %  EBL: None  Complications: No immediate post-treatment complications observed by team, or reported  by patient.  Note: The patient tolerated the entire procedure well. A repeat set of vitals were taken after the procedure and the patient was kept under observation following institutional policy, for this type of procedure. Post-procedural neurological assessment was performed, showing return to baseline, prior to discharge. The patient was provided with post-procedure discharge instructions, including a section on how to identify potential problems. Should any problems arise concerning this procedure, the patient was given instructions to immediately contact us, at any time, without hesitation. In any case, we plan to contact the patient by telephone for a follow-up status report regarding this interventional procedure.  Comments:  No additional relevant information.  Plan of Care  Orders:  Orders Placed This Encounter  Procedures  . PUMP REFILL    Maintain Protocol by having two(2) healthcare providers during procedure and programming.    Scheduling Instructions:     Please refill intrathecal pump today.    Order Specific Question:   Where will this procedure be performed?    Answer:   [facility]  . PUMP REFILL    Whenever possible schedule on a  procedure today.    Standing Status:   Future    Standing Expiration Date:   12/16/2019    Scheduling Instructions:     Please schedule intrathecal pump refill based on pump programming. Avoid schedule intervals of more than 120 days (4 months).    Order Specific Question:   Where will this procedure be performed?    Answer:   [facility]  . Informed Consent Details: Physician/Practitioner Attestation; Transcribe to consent form and obtain patient signature    Provider Attestation: I, Magazine Dossie Arbour, MD, (Pain Management Specialist), the physician/practitioner, attest that I have discussed with the patient the benefits, risks, side effects, alternatives, likelihood of achieving goals and potential problems during recovery for the procedure that I have provided informed consent.    Scheduling Instructions:     Procedure: Intrathecal Pump Refill     Attending Physician: Beatriz Chancellor A. Dossie Arbour, MD     Indications: Chronic Pain Syndrome (G89.4)     Transcribe to consent form and obtain patient signature.   Chronic Opioid Analgesic:  Oxycodone IR 10 mg, 1 tab PO q 6 hrs (40 mg/day of oxycodone)+ 52.9 mcg/hr intrathecal PF-Fentanyl (1,268.8 mcg/day of Fentanyl) MME/day:52m/day(from oral medication)+ 126.96 mg/day (from intrathecal fentanyl) = 186.96MME/day(Aprox. 1.08MME/day/kg).   Medications ordered for procedure: Meds ordered this encounter  Medications  . Oxycodone HCl 10 MG TABS    Sig: Take 1 tablet (10 mg total) by mouth every 6 (six) hours as needed. Must last 30 days    Dispense:  120 tablet    Refill:  0    Chronic Pain: STOP Act (Not applicable) Fill 1 day early if closed on refill date. Do not fill until: 08/24/2019. To last until: 09/23/2019. Avoid benzodiazepines within 8 hours of opioids  . Oxycodone HCl 10 MG TABS    Sig: Take 1 tablet (10 mg total) by mouth every 6 (six) hours as needed. Must last 30 days    Dispense:  120 tablet    Refill:  0    Chronic  Pain: STOP Act (Not applicable) Fill 1 day early if closed on refill date. Do not fill until: 09/23/2019. To last until: 10/23/2019. Avoid benzodiazepines within 8 hours of opioids   Medications administered: JJeneen RinksB. Ruggerio had no medications administered during this visit.  See the medical record for exact dosing, route, and time of  administration.  Follow-up plan:   Return for Pump Refill (Max:23mo.       Interventional management options: Considering: NOTE: NO RFA until BMI <35  Palliative/therapeuticintrathecal pump refill and maintenance Diagnostic left Genicular NB Possible left Genicular nerve RFA Possible bilateral lumbar facet RFA(NO Lumbar RFA until BMI <35) Diagnostic caudal ESI+epidurogram Possible Racz procedure Diagnostic bilateral IA shoulder joint injection Diagnostic bilateral suprascapular NB Possible bilateral suprascapular nerve RFA Diagnostic LESI vs TFESI   Palliative PRN treatment(s): Palliative/therapeuticintrathecal pump refill and maintenance Diagnostic bilateral lumbar facet block #2    Recent Visits Date Type Provider Dept  05/26/19 Procedure visit NMilinda Pointer MD [facility]  Showing recent visits within past 90 days and meeting all other requirements   Today's Visits Date Type Provider Dept  07/19/19 Procedure visit NMilinda Pointer MD [facility]  Showing today's visits and meeting all other requirements   Future Appointments Date Type Provider Dept  09/06/19 Appointment NMilinda Pointer MD [facility]  Showing future appointments within next 90 days and meeting all other requirements   Disposition: Discharge home  Discharge (Date): 07/19/2019   Primary Care Physician: SAntony Contras MD [facility] Note by: FGaspar Cola MD Date: 07/19/2019; Time: 12:15 PM  Disclaimer:  Medicine is not an exact science. The only guarantee in  medicine is that nothing is guaranteed. It is important to note that the decision to proceed with this intervention was based on the information collected from the patient. The Data and conclusions were drawn from the patient's questionnaire, the interview, and the physical examination. Because the information was provided in large part by the patient, it cannot be guaranteed that it has not been purposely or unconsciously manipulated. Every effort has been made to obtain as much relevant data as possible for this evaluation. It is important to note that the conclusions that lead to this procedure are derived in large part from the available data. Always take into account that the treatment will also be dependent on availability of resources and existing treatment guidelines, considered by other Pain Management Practitioners as being common knowledge and practice, at the time of the intervention. For Medico-Legal purposes, it is also important to point out that variation in procedural techniques and pharmacological choices are the acceptable norm. The indications, contraindications, technique, and results of the above procedure should only be interpreted and judged by a Board-Certified Interventional Pain Specialist with extensive familiarity and expertise in the same exact procedure and technique.

## 2019-07-20 ENCOUNTER — Telehealth: Payer: Self-pay | Admitting: *Deleted

## 2019-07-20 ENCOUNTER — Ambulatory Visit: Payer: No Typology Code available for payment source | Admitting: Pain Medicine

## 2019-07-20 NOTE — Telephone Encounter (Signed)
No problems post pump refill. 

## 2019-07-29 ENCOUNTER — Ambulatory Visit: Payer: Medicare Other | Admitting: Internal Medicine

## 2019-08-02 ENCOUNTER — Other Ambulatory Visit: Payer: Self-pay | Admitting: Pain Medicine

## 2019-08-02 DIAGNOSIS — M792 Neuralgia and neuritis, unspecified: Secondary | ICD-10-CM

## 2019-08-04 ENCOUNTER — Ambulatory Visit (INDEPENDENT_AMBULATORY_CARE_PROVIDER_SITE_OTHER): Payer: 59 | Admitting: Internal Medicine

## 2019-08-04 ENCOUNTER — Encounter: Payer: Self-pay | Admitting: Internal Medicine

## 2019-08-04 ENCOUNTER — Other Ambulatory Visit: Payer: Self-pay

## 2019-08-04 VITALS — BP 142/82 | HR 93 | Ht 71.0 in | Wt >= 6400 oz

## 2019-08-04 DIAGNOSIS — I5031 Acute diastolic (congestive) heart failure: Secondary | ICD-10-CM

## 2019-08-04 DIAGNOSIS — I50811 Acute right heart failure: Secondary | ICD-10-CM

## 2019-08-04 DIAGNOSIS — J9621 Acute and chronic respiratory failure with hypoxia: Secondary | ICD-10-CM

## 2019-08-04 DIAGNOSIS — R0609 Other forms of dyspnea: Secondary | ICD-10-CM

## 2019-08-04 DIAGNOSIS — R0600 Dyspnea, unspecified: Secondary | ICD-10-CM | POA: Diagnosis not present

## 2019-08-04 DIAGNOSIS — G4733 Obstructive sleep apnea (adult) (pediatric): Secondary | ICD-10-CM

## 2019-08-04 DIAGNOSIS — Z79899 Other long term (current) drug therapy: Secondary | ICD-10-CM

## 2019-08-04 NOTE — Patient Instructions (Signed)
Medication Instructions:  The current medical regimen is effective;  continue present plan and medications as directed. Please refer to the Current Medication list given to you today. *If you need a refill on your cardiac medications before your next appointment, please call your pharmacy*  Special Instructions Referral to advanced heart failure Dr Clarise Cruz or Dr Shirlee Latch  Follow-Up: Your next appointment:  3 month(s)  In Person with Weston Brass, MD  At [facility], you and your health needs are our priority.  As part of our continuing mission to provide you with exceptional heart care, we have created designated Provider Care Teams.  These Care Teams include your primary Cardiologist (physician) and Advanced Practice Providers (APPs -  Physician Assistants and Nurse Practitioners) who all work together to provide you with the care you need, when you need it.

## 2019-08-04 NOTE — Progress Notes (Signed)
Cardiology Office Note:    Date:  08/04/2019   ID:  [name], DOB 22-Apr-1965, MRN [id number]  PCP:  [name], MD  Cardiologist:  Parke Poisson, MD  Electrophysiologist:  None   Referring MD: [name], MD   Chief Complaint: Follow-up heart failure and recent hospitalization for shortness of breath and hypoxia  History of Present Illness:    [name] is a 54 y.o. male with a history of heart failure felt to be diastolic in nature who is well-known to my clinic.  His volume status is quite challenging to assess.  He recently had hospitalization for shortness of breath with hypoxia with saturations dropping to the 50% range per hospital documentation, with jerking arm movements which accompany his hypoxia and shortness of breath.  He was responsive to 6 L nasal cannula.  He has severe chronic pain and finds it difficult to get up to urinate frequently on Bumex.  On hospital presentation he was noted to be approximately 20 pounds increased weight since January.  I last visited with the patient in July 2020 when we increased his Bumex to 2 mg in the a.m. and 1 mg in the p.m.  In office today he appears mildly cyanotic lying supine for EKG.  He is quite concerned, but remains in good spirits.  His wife has had significant medical issues since our last visit in July.  They both remain optimistic and cheerful.  They have 2 adopted children, and 1 is planning to attend college soon.  They are incredibly proud of her achievements and derive great joy spending time with their son and daughter.  Their son has developmental delay and remains at home with them.  We have discussed the challenging nature of managing his heart failure given comorbidities and extremely difficult volume exam.  They agree that this has been quite a struggle.    Past Medical History:  Diagnosis Date  . Arthritis   . Asthma    as child  . Back pain   . CHF (congestive heart failure) (HCC)   . COPD  (chronic obstructive pulmonary disease) (HCC)   . GERD (gastroesophageal reflux disease)   . Hernia   . History of hiatal hernia   . Hypogonadism in male   . Neuromuscular disorder (HCC)    back injury with surgery  . PONV (postoperative nausea and vomiting)    gets nausea  . Shortness of breath dyspnea   . Sleep apnea    Does not use CPAP, new equipment 12/2014  . Ulcer   . Wears glasses     Past Surgical History:  Procedure Laterality Date  . BACK SURGERY    . JOINT REPLACEMENT     Left knee replacement  . LAPAROSCOPIC GASTRIC SLEEVE RESECTION N/A 01/08/2015   Procedure: LAPAROSCOPIC GASTRIC SLEEVE RESECTION;  Surgeon: Luretha Murphy, MD;  Location: [facility];  Service: General;  Laterality: N/A;  . morphine pump    . SHOULDER ARTHROSCOPY WITH SUBACROMIAL DECOMPRESSION AND BICEP TENDON REPAIR Left 10/06/2013   Procedure: LEFT SHOULDER ARTHROSCOPY DEBRIDEMENT EXTENTSIVE,DISTAL CLAVICULECTOMY,DECOMPRESSION SUBACROMIAL PARTIAL ACROMIOPLASTY WITH ROTATOR CUFF REPAIR, and excision of CALCIUM DEPOSIT;  Surgeon: Loreta Ave, MD;  Location: [facility];  Service: Orthopedics;  Laterality: Left;  . SHOULDER ARTHROSCOPY WITH SUBACROMIAL DECOMPRESSION, ROTATOR CUFF REPAIR AND BICEP TENDON REPAIR Right 08/18/2013   Procedure: RIGHT SHOULDER ARTHROSCOPY WITH SUBACROMIAL DECOMPRESSION/PARTIAL ACROMIOPLASTY WITH CORACOACROMIAL RELEASE/DISTAL CLAVICULECTOMY/ ROTATOR CUFF REPAIR/DEBRIDEMENT EXTENTSIVE;  Surgeon: Loreta Ave, MD;  Location: [facility];  Service: Orthopedics;  Laterality: Right;  ANESTHESIA: GENERAL, PRE/POST OP SCALENE  . TONSILLECTOMY      Current Medications: Current Meds  Medication Sig  . acetaminophen (TYLENOL) 500 MG tablet Take 1,000 mg by mouth every 6 (six) hours as needed for moderate pain.  . bumetanide (BUMEX) 1 MG tablet Take 2 mg ( total of 2 tablets) in the morning and 2 mg ( total of 2 tablets)  In the evening  . calcium carbonate  (CALCIUM 600) 600 MG TABS tablet Take 1 tablet (600 mg total) by mouth 2 (two) times daily with a meal.  . Cholecalciferol (VITAMIN D3) 125 MCG (5000 UT) CAPS Take 1 capsule (5,000 Units total) by mouth daily with breakfast. Take along with calcium and magnesium.  Melene Muller ON 03/26/2020] cyclobenzaprine (FLEXERIL) 10 MG tablet Take 1 tablet (10 mg total) by mouth at bedtime.  Marland Kitchen escitalopram (LEXAPRO) 20 MG tablet Take 20 mg by mouth daily.  Marland Kitchen esomeprazole (NEXIUM) 40 MG capsule Take 1 capsule (40 mg total) by mouth daily. (Patient taking differently: Take 40 mg by mouth every evening. )  . GNP MILK OF MAGNESIA 1200 MG/15ML suspension Take 5 mLs by mouth daily as needed for mild constipation.   Marland Kitchen lubiprostone (AMITIZA) 24 MCG capsule Take 1 capsule (24 mcg total) by mouth 2 (two) times daily with a meal. Swallow the medication whole. Do not break or chew the medication.  . Magnesium Oxide 500 MG CAPS Take 1 capsule (500 mg total) by mouth 2 (two) times daily at 8 am and 10 pm.  . Magnesium Oxide 500 MG TABS Take 1 tablet by mouth 2 (two) times daily.  . NON FORMULARY 1,268.8 mcg by Intrathecal route daily. IT pump Fentanyl 2,000.0 mcg/ml Bupivicaine 20.0 mg/ml Baclofen 200.0 mcg/ml 24 dose 1268.8 mcg/day 40 ml pump  . [START ON 03/26/2020] ondansetron (ZOFRAN-ODT) 8 MG disintegrating tablet Take 1 tablet (8 mg total) by mouth every 8 (eight) hours as needed for nausea or vomiting.  . Oxycodone HCl 10 MG TABS Take 1 tablet (10 mg total) by mouth every 6 (six) hours as needed. Must last 30 days (Patient taking differently: Take 10 mg by mouth every 6 (six) hours as needed (pain). Must last 30 days)  . [START ON 08/24/2019] Oxycodone HCl 10 MG TABS Take 1 tablet (10 mg total) by mouth every 6 (six) hours as needed. Must last 30 days  . [START ON 09/23/2019] Oxycodone HCl 10 MG TABS Take 1 tablet (10 mg total) by mouth every 6 (six) hours as needed. Must last 30 days  . [START ON 03/26/2020] pregabalin  (LYRICA) 150 MG capsule Take 1 capsule (150 mg total) by mouth 3 (three) times daily.  . promethazine (PHENERGAN) 25 MG tablet Take 25-50 mg by mouth every 6 (six) hours as needed for nausea or vomiting.   . rosuvastatin (CRESTOR) 10 MG tablet Take 10 mg by mouth daily.  Melene Muller ON 03/26/2020] tiZANidine (ZANAFLEX) 4 MG tablet Take 1 tablet (4 mg total) by mouth every 8 (eight) hours as needed for muscle spasms.     Allergies:   Tape   Social History   Socioeconomic History  . Marital status: Married    Spouse name: Not on file  . Number of children: Not on file  . Years of education: Not on file  . Highest education level: Not on file  Occupational History  . Not on file  Tobacco Use  .  Smoking status: Never Smoker  . Smokeless tobacco: Current User    Types: Snuff  Substance and Sexual Activity  . Alcohol use: Yes    Comment: 2-7 beers per day, starting in Oct 2019.  . Drug use: No  . Sexual activity: Yes  Other Topics Concern  . Not on file  Social History Narrative  . Not on file   Social Determinants of Health   Financial Resource Strain:   . Difficulty of Paying Living Expenses:   Food Insecurity:   . Worried About Charity fundraiser in the Last Year:   . Arboriculturist in the Last Year:   Transportation Needs:   . Film/video editor (Medical):   Marland Kitchen Lack of Transportation (Non-Medical):   Physical Activity:   . Days of Exercise per Week:   . Minutes of Exercise per Session:   Stress:   . Feeling of Stress :   Social Connections:   . Frequency of Communication with Friends and Family:   . Frequency of Social Gatherings with Friends and Family:   . Attends Religious Services:   . Active Member of Clubs or Organizations:   . Attends Archivist Meetings:   Marland Kitchen Marital Status:      Family History: The patient's family history includes Diabetes in his mother; Pneumonia in his father.  ROS:   Please see the history of present illness.    All  other systems reviewed and are negative.  EKGs/Labs/Other Studies Reviewed:    The following studies were reviewed today:  EKG: Normal sinus rhythm, anterior infarct pattern  Recent Labs: 06/11/2019: ALT 31; B Natriuretic Peptide 97.0 06/12/2019: Hemoglobin 14.5; Platelets 217 06/13/2019: BUN 13; Creatinine, Ser 0.84; Potassium 3.5; Sodium 137  Recent Lipid Panel    Component Value Date/Time   TRIG 76 06/11/2018 0539    Physical Exam:    VS:  BP (!) 142/82   Pulse 93   Ht 5\' 11"  (1.803 m)   Wt (!) 431 lb 3.2 oz (195.6 kg)   SpO2 (!) 84%   BMI 60.14 kg/m     Wt Readings from Last 5 Encounters:  08/04/19 (!) 431 lb 3.2 oz (195.6 kg)  07/19/19 (!) 430 lb (195 kg)  06/13/19 (!) 395 lb 11.2 oz (179.5 kg)  05/26/19 (!) 400 lb (181.4 kg)  04/05/19 (!) 375 lb (170.1 kg)     GEN: No acute distress.   Neck:  Challenging to assess given body habitus Cardiac: RRR, no murmurs, rubs, or gallops.  Respiratory: Clear to auscultation bilaterally. GI: Soft, nontender, non-distended  MS:  Diffuse bilateral lower extremity edema; No deformity. Neuro:  Nonfocal  Psych: Normal affect   ASSESSMENT:    1. Dyspnea on exertion   2. Acute right-sided congestive heart failure ([place])   3. Acute diastolic heart failure ([place])   4. OSA complicated by mild polycythemia    5. Medication management   6. Obesity, morbid (more than 100 lbs over ideal weight or BMI > 40) (HCC)   7. Acute on chronic respiratory failure with hypoxia (HCC)    PLAN:    We have struggled with volume management due to medication inconsistencies complicated by the patient's chronic pain syndrome.  His body habitus makes determination of volume status somewhat challenging.  He agrees that it is difficult for him to tell when he has gained weight until he is so far behind requiring hospital admission.  At this point I believe a referral to  advanced heart failure is indicated.  Would consider right heart catheterization to  assist in understanding his hypoxia and heart failure which is likely multifactorial.  He may also be a good candidate for a cardio mems pulmonary artery pressure monitoring device to guide therapy and involve heart failure in the titration of his diuretics.  He agrees that this may be the next reasonable step and he and his wife are agreeable to heart failure consultation.  No change to medical therapy at this time, will await guidance of heart failure clinic.  Total time of encounter: 30 minutes total time of encounter, including 25 minutes spent in face-to-face patient care on the date of this encounter. This time includes coordination of care and counseling regarding above mentioned problem list. Remainder of non-face-to-face time involved reviewing chart documents/testing relevant to the patient encounter and documentation in the medical record. I have independently reviewed documentation from referring provider.   Weston Brass, MD [place]  [facility]    Medication Adjustments/Labs and Tests Ordered: Current medicines are reviewed at length with the patient today.  Concerns regarding medicines are outlined above.  Orders Placed This Encounter  Procedures  . AMB referral to CHF clinic  . EKG 12-Lead   No orders of the defined types were placed in this encounter.   Patient Instructions  Medication Instructions:  The current medical regimen is effective;  continue present plan and medications as directed. Please refer to the Current Medication list given to you today. *If you need a refill on your cardiac medications before your next appointment, please call your pharmacy*  Special Instructions Referral to advanced heart failure Dr Clarise Cruz or Dr Shirlee Latch  Follow-Up: Your next appointment:  3 month(s)  In Person with Weston Brass, MD  At [facility], you and your health needs are our priority.  As part of our continuing mission to provide you with exceptional heart  care, we have created designated Provider Care Teams.  These Care Teams include your primary Cardiologist (physician) and Advanced Practice Providers (APPs -  Physician Assistants and Nurse Practitioners) who all work together to provide you with the care you need, when you need it.

## 2019-08-10 ENCOUNTER — Other Ambulatory Visit: Payer: Self-pay

## 2019-08-10 MED ORDER — PAIN MANAGEMENT IT PUMP REFILL: 1.0000 | Freq: Once | INTRATHECAL | 0 refills | Status: AC

## 2019-08-14 ENCOUNTER — Other Ambulatory Visit: Payer: Self-pay

## 2019-08-14 ENCOUNTER — Encounter (HOSPITAL_BASED_OUTPATIENT_CLINIC_OR_DEPARTMENT_OTHER): Payer: Self-pay

## 2019-08-14 ENCOUNTER — Emergency Department (HOSPITAL_BASED_OUTPATIENT_CLINIC_OR_DEPARTMENT_OTHER): Payer: 59

## 2019-08-14 ENCOUNTER — Emergency Department (HOSPITAL_BASED_OUTPATIENT_CLINIC_OR_DEPARTMENT_OTHER)
Admission: EM | Admit: 2019-08-14 | Discharge: 2019-08-14 | Disposition: A | Discharge status: Home / Self Care | Payer: 59 | Attending: Emergency Medicine | Admitting: Emergency Medicine

## 2019-08-14 DIAGNOSIS — W19XXXA Unspecified fall, initial encounter: Secondary | ICD-10-CM | POA: Insufficient documentation

## 2019-08-14 DIAGNOSIS — S99921A Unspecified injury of right foot, initial encounter: Secondary | ICD-10-CM | POA: Diagnosis present

## 2019-08-14 DIAGNOSIS — Z79899 Other long term (current) drug therapy: Secondary | ICD-10-CM | POA: Diagnosis not present

## 2019-08-14 DIAGNOSIS — R7303 Prediabetes: Secondary | ICD-10-CM | POA: Diagnosis not present

## 2019-08-14 DIAGNOSIS — Y929 Unspecified place or not applicable: Secondary | ICD-10-CM | POA: Diagnosis not present

## 2019-08-14 DIAGNOSIS — Z7984 Long term (current) use of oral hypoglycemic drugs: Secondary | ICD-10-CM | POA: Diagnosis not present

## 2019-08-14 DIAGNOSIS — R2241 Localized swelling, mass and lump, right lower limb: Secondary | ICD-10-CM | POA: Diagnosis not present

## 2019-08-14 DIAGNOSIS — J449 Chronic obstructive pulmonary disease, unspecified: Secondary | ICD-10-CM | POA: Diagnosis not present

## 2019-08-14 DIAGNOSIS — I5032 Chronic diastolic (congestive) heart failure: Secondary | ICD-10-CM | POA: Insufficient documentation

## 2019-08-14 DIAGNOSIS — M25471 Effusion, right ankle: Secondary | ICD-10-CM

## 2019-08-14 DIAGNOSIS — S99191A Other physeal fracture of right metatarsal, initial encounter for closed fracture: Secondary | ICD-10-CM

## 2019-08-14 DIAGNOSIS — S92351A Displaced fracture of fifth metatarsal bone, right foot, initial encounter for closed fracture: Secondary | ICD-10-CM | POA: Insufficient documentation

## 2019-08-14 DIAGNOSIS — Y939 Activity, unspecified: Secondary | ICD-10-CM | POA: Diagnosis not present

## 2019-08-14 DIAGNOSIS — Y999 Unspecified external cause status: Secondary | ICD-10-CM | POA: Diagnosis not present

## 2019-08-14 MED ORDER — BACITRACIN ZINC 500 UNIT/GM EX OINT
TOPICAL_OINTMENT | Freq: Two times a day (BID) | CUTANEOUS | Status: AC
  Administered 2019-08-14: 1 via TOPICAL
  Filled 2019-08-14: qty 28.35

## 2019-08-14 NOTE — Discharge Instructions (Addendum)
Apply ice and elevate foot for 20 minutes at least 3 times daily to help with pain and swelling. NON weight bearing on this foot. Follow up with orthopedics, referral given if you do not have a local orthopedist to see.

## 2019-08-14 NOTE — ED Provider Notes (Signed)
[facility] EMERGENCY DEPARTMENT Provider Note   CSN: [id number] Arrival date & time: 08/14/19  1255     History Chief Complaint  Patient presents with  . Fall    [name] is a 54 y.o. male.  54 year old male presents with complaint of right foot pain after fall yesterday.  Patient reports pain to his right lateral foot, worse with bearing weight.  Reports prior fracture to his fifth metatarsal in the 1980s.  Patient states that he falls very frequently and sees his medical team for this regularly.  Denies hitting his head or loss of consciousness with this event.  Also reports pain in his tailbone from a fall a week and a half ago when he fell onto his walker.  No other injuries or concerns.        Past Medical History:  Diagnosis Date  . Arthritis   . Asthma    as child  . Back pain   . CHF (congestive heart failure) (HCC)   . COPD (chronic obstructive pulmonary disease) (HCC)   . GERD (gastroesophageal reflux disease)   . Hernia   . History of hiatal hernia   . Hypogonadism in male   . Neuromuscular disorder (HCC)    back injury with surgery  . PONV (postoperative nausea and vomiting)    gets nausea  . Shortness of breath dyspnea   . Sleep apnea    Does not use CPAP, new equipment 12/2014  . Ulcer   . Wears glasses     Patient Active Problem List   Diagnosis Date Noted  . Acute on chronic respiratory failure with hypoxia (HCC) 06/12/2019  . Acute diastolic heart failure (HCC) 06/11/2019  . Acute respiratory failure with hypoxemia (HCC) 06/11/2019  . Acute respiratory failure with hypoxia (HCC) 06/11/2019  . Preoperative testing 09/13/2018  . AMS (altered mental status) 07/11/2018  . Fever   . Myoclonic jerking   . Obesity, morbid (more than 100 lbs over ideal weight or BMI > 40) (HCC) 06/30/2018  . DDD (degenerative disc disease), thoracic 06/30/2018  . Osteoarthritis of lumbar spine 06/30/2018  . History of acute alcoholic pancreatitis  06/30/2018  . Hypogonadism in male 06/30/2018  . Vitamin D deficiency 06/30/2018  . Chronic hyperglycemia 06/30/2018  . Prediabetes 06/30/2018  . Alcoholism (HCC) 06/18/2018  . Alcohol-induced acute pancreatitis without infection or necrosis 06/18/2018  . Acute alcoholic pancreatitis 06/11/2018  . Hypokalemia 06/11/2018  . Hyponatremia 06/11/2018  . Chronic diastolic CHF (congestive heart failure) (HCC) 06/11/2018  . Acute pancreatitis 06/10/2018  . Nausea and vomiting 06/10/2018  . Abdominal pain 06/10/2018  . Encounter for screening colonoscopy 06/10/2018  . Acute CHF (congestive heart failure) (HCC) 05/29/2018  . Withdrawal from opioids (HCC) 05/27/2018  . Morbid obesity with BMI of 50.0-59.9, adult (HCC) 05/25/2018  . Secondary osteoarthritis of multiple sites 05/25/2018  . Osteoarthritis of hip (Right) 05/25/2018  . Encounter for adjustment or management of infusion pump 04/01/2018  . Lumbar spondylosis 01/26/2018  . Spondylosis without myelopathy or radiculopathy, lumbosacral region 12/02/2017  . Lumbar facet syndrome (Bilateral) (R>L) 12/02/2017  . Chronic low back pain (Primary Area of Pain) (Bilateral) (R>L) 12/02/2017  . Chronic knee pain after total replacement (Left) 12/01/2017  . Chronic musculoskeletal pain 11/17/2017  . Muscle spasticity 11/17/2017  . Gastroesophageal reflux disease without esophagitis 11/17/2017  . Neurogenic pain 11/17/2017  . Drug-induced nausea and vomiting 11/17/2017  . Therapeutic opioid-induced constipation (OIC) 11/17/2017  . Chronic pain syndrome 11/09/2017  .  Long term prescription benzodiazepine use 11/09/2017  . Long term prescription opiate use 11/09/2017  . Oral Opiate use (60 MME/day) 11/09/2017  . Long-term use of high-risk medication 11/09/2017  . Pharmacologic therapy 11/09/2017  . Disorder of skeletal system 11/09/2017  . Problems influencing health status 11/09/2017  . Chronic nausea s/p bariatric surgery 11/09/2017  .  Presence of intrathecal pump 11/09/2017  . Encounter for interrogation of infusion pump 11/09/2017  . Chronic foot pain (Right) 11/09/2017  . Chronic hip pain (Right) 11/09/2017  . Old tear of medial meniscus of knee (Left) 11/09/2017  . Failed back surgical syndrome 11/09/2017  . Chronic low back pain (Bilateral) w/ sciatica (Bilateral) 11/09/2017  . Chronic lower extremity pain (Secondary area of Pain) (Bilateral) (L>R) 11/09/2017  . Chronic lower extremity radicular pain 11/09/2017  . Chronic shoulder pain (Bilateral) 11/09/2017  . History of total knee replacement (Left) 11/09/2017  . History of arthroscopic surgery of shoulder (Bilateral) 11/09/2017  . S/P laparoscopic sleeve gastrectomy Sept 2016 01/08/2015  . Severe obesity (BMI >= 40) (HCC) 10/21/2014  . OSA complicated by mild polycythemia  10/21/2014  . Polycythemia, secondary 10/21/2014  . Dyspnea 10/20/2014  . S/P rotator cuff repair (Bilateral) 08/18/2013  . DDD (degenerative disc disease), lumbosacral 10/20/2006    Past Surgical History:  Procedure Laterality Date  . BACK SURGERY    . JOINT REPLACEMENT     Left knee replacement  . LAPAROSCOPIC GASTRIC SLEEVE RESECTION N/A 01/08/2015   Procedure: LAPAROSCOPIC GASTRIC SLEEVE RESECTION;  Surgeon: Luretha MurphyMatthew Martin, MD;  Location: [facility];  Service: General;  Laterality: N/A;  . morphine pump    . SHOULDER ARTHROSCOPY WITH SUBACROMIAL DECOMPRESSION AND BICEP TENDON REPAIR Left 10/06/2013   Procedure: LEFT SHOULDER ARTHROSCOPY DEBRIDEMENT EXTENTSIVE,DISTAL CLAVICULECTOMY,DECOMPRESSION SUBACROMIAL PARTIAL ACROMIOPLASTY WITH ROTATOR CUFF REPAIR, and excision of CALCIUM DEPOSIT;  Surgeon: Loreta Aveaniel F Erianna Jolly, MD;  Location: [facility];  Service: Orthopedics;  Laterality: Left;  . SHOULDER ARTHROSCOPY WITH SUBACROMIAL DECOMPRESSION, ROTATOR CUFF REPAIR AND BICEP TENDON REPAIR Right 08/18/2013   Procedure: RIGHT SHOULDER ARTHROSCOPY WITH SUBACROMIAL DECOMPRESSION/PARTIAL  ACROMIOPLASTY WITH CORACOACROMIAL RELEASE/DISTAL CLAVICULECTOMY/ ROTATOR CUFF REPAIR/DEBRIDEMENT EXTENTSIVE;  Surgeon: Loreta Aveaniel F Makani Seckman, MD;  Location: [facility];  Service: Orthopedics;  Laterality: Right;  ANESTHESIA: GENERAL, PRE/POST OP SCALENE  . TONSILLECTOMY         Family History  Problem Relation Age of Onset  . Diabetes Mother   . Pneumonia Father     Social History   Tobacco Use  . Smoking status: Never Smoker  . Smokeless tobacco: Current User    Types: Snuff  Substance Use Topics  . Alcohol use: Yes    Comment: 2-7 beers per day, starting in Oct 2019.  . Drug use: No    Home Medications Prior to Admission medications   Medication Sig Start Date End Date Taking? Authorizing Provider  acetaminophen (TYLENOL) 500 MG tablet Take 1,000 mg by mouth every 6 (six) hours as needed for moderate pain.    [provider]  bumetanide (BUMEX) 1 MG tablet Take 2 mg ( total of 2 tablets) in the morning and 2 mg ( total of 2 tablets)  In the evening 06/13/19   Alwyn RenMathews, Elizabeth G, MD  calcium carbonate (CALCIUM 600) 600 MG TABS tablet Take 1 tablet (600 mg total) by mouth 2 (two) times daily with a meal. 12/27/18 12/27/19  Delano MetzNaveira, Francisco, MD  Cholecalciferol (VITAMIN D3) 125 MCG (5000 UT) CAPS Take 1 capsule (5,000 Units total) by mouth  daily with breakfast. Take along with calcium and magnesium. 12/27/18 12/27/19  [name], MD  cyclobenzaprine (FLEXERIL) 10 MG tablet Take 1 tablet (10 mg total) by mouth at bedtime. 03/26/20 09/22/20  [name], MD  escitalopram (LEXAPRO) 20 MG tablet Take 20 mg by mouth daily. 05/31/19   [provider]  esomeprazole (NEXIUM) 40 MG capsule Take 1 capsule (40 mg total) by mouth daily. Patient taking differently: Take 40 mg by mouth every evening.  12/27/18 12/27/19  [name], MD  GNP MILK OF MAGNESIA 1200 MG/15ML suspension Take 5 mLs by mouth daily as needed for mild constipation.  05/05/18    [provider]  lubiprostone (AMITIZA) 24 MCG capsule Take 1 capsule (24 mcg total) by mouth 2 (two) times daily with a meal. Swallow the medication whole. Do not break or chew the medication. 03/27/19 09/23/19  [name], MD  Magnesium Oxide 500 MG CAPS Take 1 capsule (500 mg total) by mouth 2 (two) times daily at 8 am and 10 pm. 12/27/18 12/27/19  [name], MD  Magnesium Oxide 500 MG TABS Take 1 tablet by mouth 2 (two) times daily. 07/29/19   [provider]  NON FORMULARY 1,268.8 mcg by Intrathecal route daily. IT pump Fentanyl 2,000.0 mcg/ml Bupivicaine 20.0 mg/ml Baclofen 200.0 mcg/ml 24 dose 1268.8 mcg/day 40 ml pump    [provider]  ondansetron (ZOFRAN-ODT) 8 MG disintegrating tablet Take 1 tablet (8 mg total) by mouth every 8 (eight) hours as needed for nausea or vomiting. 03/26/20 09/22/20  [name], MD  Oxycodone HCl 10 MG TABS Take 1 tablet (10 mg total) by mouth every 6 (six) hours as needed. Must last 30 days Patient taking differently: Take 10 mg by mouth every 6 (six) hours as needed (pain). Must last 30 days 07/25/19 08/24/19  [name], MD  Oxycodone HCl 10 MG TABS Take 1 tablet (10 mg total) by mouth every 6 (six) hours as needed. Must last 30 days 08/24/19 09/23/19  [name], MD  Oxycodone HCl 10 MG TABS Take 1 tablet (10 mg total) by mouth every 6 (six) hours as needed. Must last 30 days 09/23/19 10/23/19  [name], MD  pregabalin (LYRICA) 150 MG capsule Take 1 capsule (150 mg total) by mouth 3 (three) times daily. 03/26/20 09/22/20  [name], MD  promethazine (PHENERGAN) 25 MG tablet Take 25-50 mg by mouth every 6 (six) hours as needed for nausea or vomiting.  05/26/19   [provider]  rosuvastatin (CRESTOR) 10 MG tablet Take 10 mg by mouth daily. 07/25/19   [provider]  tiZANidine (ZANAFLEX) 4 MG tablet Take 1 tablet (4 mg total) by mouth every 8 (eight) hours as needed  for muscle spasms. 03/26/20 09/22/20  [name], MD    Allergies    Tape  Review of Systems   Review of Systems  Musculoskeletal: Positive for arthralgias, gait problem and joint swelling.  Skin: Positive for wound.  Neurological: Negative for weakness and numbness.    Physical Exam Updated Vital Signs BP (!) 145/81 (BP Location: Left Wrist)   Pulse 97   Temp 99.5 F (37.5 C) (Oral)   Resp 18   Ht 5\' 11"  (1.803 m)   Wt (!) 190.5 kg   SpO2 93%   BMI 58.58 kg/m   Physical Exam Vitals and nursing note reviewed.  Constitutional:      Appearance: He is obese. He is not ill-appearing or toxic-appearing.  Cardiovascular:  Pulses: Normal pulses.  Musculoskeletal:        General: Swelling and tenderness present. No deformity.     Right ankle: No swelling. No tenderness.     Right foot: Decreased range of motion. Normal capillary refill. Swelling, laceration and tenderness present. Normal pulse.       Legs:     Comments: Exam complicated by body habitus, no tenderness to sacrum, tenderness noted to coccyx  Skin:    General: Skin is warm and dry.     Capillary Refill: Capillary refill takes less than 2 seconds.     Findings: Bruising present. No erythema or rash.  Neurological:     General: No focal deficit present.     Mental Status: He is alert and oriented to person, place, and time.     ED Results / Procedures / Treatments   Labs (all labs ordered are listed, but only abnormal results are displayed) Labs Reviewed - No data to display  EKG None  Radiology DG Foot Complete Right  Result Date: 08/14/2019 CLINICAL DATA:  Pt arrives with reports of recent falls, c/o pain and swelling to right foot. Pain is lateral and anterior EXAM: RIGHT FOOT COMPLETE - 3+ VIEW COMPARISON:  None. FINDINGS: There is nondisplaced a transverse fracture between the base and the shaft of the fifth metatarsal. The remaining bones of the foot are unremarkable. There is no evidence  of dislocation. There is pronounced regional soft tissue swelling. IMPRESSION: Nondisplaced fracture between the base and shaft of the fifth metatarsal (Jones fracture). Electronically Signed   By: Emmaline Kluver M.D.   On: 08/14/2019 14:18    Procedures Procedures (including critical care time)  Medications Ordered in ED Medications  bacitracin ointment (has no administration in time range)    ED Course  I have reviewed the triage vital signs and the nursing notes.  Pertinent labs & imaging results that were available during my care of the patient were reviewed by me and considered in my medical decision making (see chart for details).  Clinical Course as of Aug 14 1451  Sun Aug 14, 2019  [id number] 54 year old male with right foot pain after fall yesterday.  Exam as above, x-ray shows Jones fracture to fifth metatarsal.  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  Patient will have to transfer from his scooter to the commode at home and for this reason was placed in a cam walker as a posterior splint will not be able to support the patient and he is not able to use crutches.  Recommend patient ice and elevate the foot at home and follow-up with orthopedics.  Regarding pain in his tailbone from a week and a half ago, discussed option to x-ray the area however if there is a fracture of the coccyx, this would not change the treatment plan.  Patient declines x-ray of the area today. When patient was moved from the wheelchair to the stretcher today, he did have a drop in his oxygen saturation.  Patient refuses to sit upright in bed secondary to discomfort and was placed on a nasal cannula while he was in the ER and supine.  Patient does have sleep apnea, is noncompliant with his CPAP machine at home.  Patient states that he is being worked up by cardiology and pulmonology regarding his respiratory status.  No acute changes to this today.   [LM]      Clinical Course User Index [  LM] Roque Lias   MDM Rules/Calculators/A&P                      Final Clinical Impression(s) / ED Diagnoses Final diagnoses:  Swelling of ankle, right  Fall, initial encounter  Closed fracture of base of fifth metatarsal bone of right foot at metaphyseal-diaphyseal junction, initial encounter    Rx / DC Orders ED Discharge Orders    None       Tacy Learn, PA-C 08/14/19 1453    Isla Pence, MD 08/14/19 1529

## 2019-08-14 NOTE — ED Triage Notes (Signed)
Pt arrives with reports of recent falls, c/o pain and swelling to right foot.

## 2019-08-14 NOTE — Progress Notes (Signed)
RT called to assess patient at triage due to sat in the low 90's. Lowest that was seen by RN was 88%. When I arrived, patient was in no distress. Both he and wife stated that they check SATs at home and he is generally high 80's, low 90's. I do not feel as if he needs any further intervention at this time.

## 2019-08-14 NOTE — Progress Notes (Signed)
RT asked to look at patient again due to decrease in SAT. When I arrived, patient is laying completely flat, SAT 82%. I asked him to sit up, and he declined. I asked him if he wears a CPAP at home and he stated he is supposed to but non-compliant. Placed patient on 4LNC to get SAT of 93. MD and RN aware of situation.

## 2019-08-14 NOTE — ED Notes (Signed)
ED Provider at bedside. 

## 2019-08-24 ENCOUNTER — Ambulatory Visit
Admission: RE | Admit: 2019-08-24 | Discharge: 2019-08-24 | Disposition: A | Discharge status: Home / Self Care | Payer: 59 | Source: Ambulatory Visit | Attending: Family Medicine | Admitting: Family Medicine

## 2019-08-24 ENCOUNTER — Other Ambulatory Visit: Payer: Self-pay | Admitting: Family Medicine

## 2019-08-24 ENCOUNTER — Other Ambulatory Visit: Payer: Self-pay

## 2019-08-24 DIAGNOSIS — R0609 Other forms of dyspnea: Secondary | ICD-10-CM

## 2019-08-24 DIAGNOSIS — R059 Cough, unspecified: Secondary | ICD-10-CM

## 2019-08-25 ENCOUNTER — Other Ambulatory Visit: Payer: Self-pay

## 2019-08-25 ENCOUNTER — Encounter (HOSPITAL_BASED_OUTPATIENT_CLINIC_OR_DEPARTMENT_OTHER): Payer: Self-pay | Admitting: *Deleted

## 2019-08-25 ENCOUNTER — Emergency Department (HOSPITAL_BASED_OUTPATIENT_CLINIC_OR_DEPARTMENT_OTHER): Payer: 59

## 2019-08-25 ENCOUNTER — Emergency Department (HOSPITAL_BASED_OUTPATIENT_CLINIC_OR_DEPARTMENT_OTHER)
Admission: EM | Admit: 2019-08-25 | Discharge: 2019-08-25 | Disposition: A | Discharge status: Home / Self Care | Payer: 59 | Attending: Emergency Medicine | Admitting: Emergency Medicine

## 2019-08-25 DIAGNOSIS — W19XXXA Unspecified fall, initial encounter: Secondary | ICD-10-CM

## 2019-08-25 DIAGNOSIS — W06XXXA Fall from bed, initial encounter: Secondary | ICD-10-CM | POA: Insufficient documentation

## 2019-08-25 DIAGNOSIS — S0181XA Laceration without foreign body of other part of head, initial encounter: Secondary | ICD-10-CM | POA: Insufficient documentation

## 2019-08-25 DIAGNOSIS — S0990XA Unspecified injury of head, initial encounter: Secondary | ICD-10-CM | POA: Diagnosis present

## 2019-08-25 DIAGNOSIS — Y999 Unspecified external cause status: Secondary | ICD-10-CM | POA: Diagnosis not present

## 2019-08-25 DIAGNOSIS — Y92003 Bedroom of unspecified non-institutional (private) residence as the place of occurrence of the external cause: Secondary | ICD-10-CM | POA: Diagnosis not present

## 2019-08-25 DIAGNOSIS — I5032 Chronic diastolic (congestive) heart failure: Secondary | ICD-10-CM | POA: Insufficient documentation

## 2019-08-25 DIAGNOSIS — Z79899 Other long term (current) drug therapy: Secondary | ICD-10-CM | POA: Insufficient documentation

## 2019-08-25 DIAGNOSIS — Y9389 Activity, other specified: Secondary | ICD-10-CM | POA: Insufficient documentation

## 2019-08-25 DIAGNOSIS — Z23 Encounter for immunization: Secondary | ICD-10-CM | POA: Insufficient documentation

## 2019-08-25 DIAGNOSIS — J449 Chronic obstructive pulmonary disease, unspecified: Secondary | ICD-10-CM | POA: Diagnosis not present

## 2019-08-25 MED ORDER — LIDOCAINE-EPINEPHRINE 2 %-1:100000 IJ SOLN: 10.0000 mL | Freq: Once | INTRAMUSCULAR | Status: DC

## 2019-08-25 MED ORDER — TETANUS-DIPHTH-ACELL PERTUSSIS 5-2.5-18.5 LF-MCG/0.5 IM SUSP
0.5000 mL | Freq: Once | INTRAMUSCULAR | Status: AC
  Administered 2019-08-25: 0.5 mL via INTRAMUSCULAR

## 2019-08-25 MED ORDER — LIDOCAINE-EPINEPHRINE 2 %-1:100000 IJ SOLN
INTRAMUSCULAR | Status: AC
  Filled 2019-08-25: qty 1.7

## 2019-08-25 MED ORDER — TETANUS-DIPHTH-ACELL PERTUSSIS 5-2.5-18.5 LF-MCG/0.5 IM SUSP
INTRAMUSCULAR | Status: AC
  Filled 2019-08-25: qty 0.5

## 2019-08-25 MED ORDER — LIDOCAINE-EPINEPHRINE (PF) 2 %-1:200000 IJ SOLN
20.0000 mL | Freq: Once | INTRAMUSCULAR | Status: AC
  Administered 2019-08-25: 10 mL
  Filled 2019-08-25: qty 20

## 2019-08-25 NOTE — Discharge Instructions (Addendum)
Sutured repair Keep the laceration site dry for the next 24 hours and leave the dressing in place. After 24 hours you may remove the dressing and gently clean the laceration site with antibacterial soap and warm water. Do not scrub the area. Do not soak the area and water for long periods of time. Don't use hydrogen peroxide, iodine-based solutions, or alcohol, which can slow healing, and will probably be painful! Apply topical bacitracin 1-2 times per day for the next 3-5 days. Return to the emergency department in 3-5 days for removal of the sutures.  You should return sooner for any signs of infection which would include increased redness around the wound, increased swelling, new drainage of yellow pus.

## 2019-08-25 NOTE — ED Notes (Signed)
  Suture cart outside room.  Lidocaine at computer.

## 2019-08-25 NOTE — ED Triage Notes (Signed)
Fell at home, hit his face on the night stand, has laceration on face near chin

## 2019-08-25 NOTE — ED Provider Notes (Signed)
[facility] EMERGENCY DEPARTMENT Provider Note   CSN: [id number] Arrival date & time: 08/25/19  1842     History Chief Complaint  Patient presents with  . Laceration    [name] is a 54 y.o. male.  HPI 54 year old male with a history of degenerative disc disease with pain pump, morbid obesity, frequent falls, chronic diastolic CHF presents to the ER after fall.  Patient was recently seen in the ER on 08/14/2019 for a fall where he was diagnosed with a Jones fracture of the fifth metatarsal.  Patient reports rolling out of bed while sleeping and hitting his face on the nightstand.  He denies LOC and is not on blood thinners.  He has a visible laceration to the right side of his chin, thinks that he has a hole in his mouth as well.  He has been able to eat, drink, talk without pain.  He reports that his recent falls are likely secondary to his oxygen saturation is dropping when he sleeps; he has an appointment with pulmonology within the month to follow-up on this and is getting worked up by cardiology.  He has a history of sleep apnea and noncompliance with his CPAP.  He ambulates at home with an electric scooter, and is still in a cam walker from his previous fall.  He is unaware of his last tetanus shot.    Past Medical History:  Diagnosis Date  . Arthritis   . Asthma    as child  . Back pain   . CHF (congestive heart failure) ([place])   . COPD (chronic obstructive pulmonary disease) ([place])   . GERD (gastroesophageal reflux disease)   . Hernia   . History of hiatal hernia   . Hypogonadism in male   . Neuromuscular disorder ([place])    back injury with surgery  . PONV (postoperative nausea and vomiting)    gets nausea  . Shortness of breath dyspnea   . Sleep apnea    Does not use CPAP, new equipment 12/2014  . Ulcer   . Wears glasses     Patient Active Problem List   Diagnosis Date Noted  . Acute on chronic respiratory failure with hypoxia ([place]) 06/12/2019  .  Acute diastolic heart failure ([place]) 06/11/2019  . Acute respiratory failure with hypoxemia ([place]) 06/11/2019  . Acute respiratory failure with hypoxia ([place]) 06/11/2019  . Preoperative testing 09/13/2018  . AMS (altered mental status) 07/11/2018  . Fever   . Myoclonic jerking   . Obesity, morbid (more than 100 lbs over ideal weight or BMI > 40) ([place]) 06/30/2018  . DDD (degenerative disc disease), thoracic 06/30/2018  . Osteoarthritis of lumbar spine 06/30/2018  . History of acute alcoholic pancreatitis 01/75/1025  . Hypogonadism in male 06/30/2018  . Vitamin D deficiency 06/30/2018  . Chronic hyperglycemia 06/30/2018  . Prediabetes 06/30/2018  . Alcoholism ([place]) 06/18/2018  . Alcohol-induced acute pancreatitis without infection or necrosis 06/18/2018  . Acute alcoholic pancreatitis 85/27/7824  . Hypokalemia 06/11/2018  . Hyponatremia 06/11/2018  . Chronic diastolic CHF (congestive heart failure) ([place]) 06/11/2018  . Acute pancreatitis 06/10/2018  . Nausea and vomiting 06/10/2018  . Abdominal pain 06/10/2018  . Encounter for screening colonoscopy 06/10/2018  . Acute CHF (congestive heart failure) ([place]) 05/29/2018  . Withdrawal from opioids ([place]) 05/27/2018  . Morbid obesity with BMI of 50.0-59.9, adult ([place]) 05/25/2018  . Secondary osteoarthritis of multiple sites 05/25/2018  . Osteoarthritis of hip (Right) 05/25/2018  . Encounter for adjustment or  management of infusion pump 04/01/2018  . Lumbar spondylosis 01/26/2018  . Spondylosis without myelopathy or radiculopathy, lumbosacral region 12/02/2017  . Lumbar facet syndrome (Bilateral) (R>L) 12/02/2017  . Chronic low back pain (Primary Area of Pain) (Bilateral) (R>L) 12/02/2017  . Chronic knee pain after total replacement (Left) 12/01/2017  . Chronic musculoskeletal pain 11/17/2017  . Muscle spasticity 11/17/2017  . Gastroesophageal reflux disease without esophagitis 11/17/2017  . Neurogenic pain 11/17/2017  . Drug-induced nausea  and vomiting 11/17/2017  . Therapeutic opioid-induced constipation (OIC) 11/17/2017  . Chronic pain syndrome 11/09/2017  . Long term prescription benzodiazepine use 11/09/2017  . Long term prescription opiate use 11/09/2017  . Oral Opiate use (60 MME/day) 11/09/2017  . Long-term use of high-risk medication 11/09/2017  . Pharmacologic therapy 11/09/2017  . Disorder of skeletal system 11/09/2017  . Problems influencing health status 11/09/2017  . Chronic nausea s/p bariatric surgery 11/09/2017  . Presence of intrathecal pump 11/09/2017  . Encounter for interrogation of infusion pump 11/09/2017  . Chronic foot pain (Right) 11/09/2017  . Chronic hip pain (Right) 11/09/2017  . Old tear of medial meniscus of knee (Left) 11/09/2017  . Failed back surgical syndrome 11/09/2017  . Chronic low back pain (Bilateral) w/ sciatica (Bilateral) 11/09/2017  . Chronic lower extremity pain (Secondary area of Pain) (Bilateral) (L>R) 11/09/2017  . Chronic lower extremity radicular pain 11/09/2017  . Chronic shoulder pain (Bilateral) 11/09/2017  . History of total knee replacement (Left) 11/09/2017  . History of arthroscopic surgery of shoulder (Bilateral) 11/09/2017  . S/P laparoscopic sleeve gastrectomy Sept 2016 01/08/2015  . Severe obesity (BMI >= 40) ([facility]) 10/21/2014  . OSA complicated by mild polycythemia  10/21/2014  . Polycythemia, secondary 10/21/2014  . Dyspnea 10/20/2014  . S/P rotator cuff repair (Bilateral) 08/18/2013  . DDD (degenerative disc disease), lumbosacral 10/20/2006    Past Surgical History:  Procedure Laterality Date  . BACK SURGERY    . JOINT REPLACEMENT     Left knee replacement  . LAPAROSCOPIC GASTRIC SLEEVE RESECTION N/A 01/08/2015   Procedure: LAPAROSCOPIC GASTRIC SLEEVE RESECTION;  Surgeon: Luretha Murphy, MD;  Location: [facility];  Service: General;  Laterality: N/A;  . morphine pump    . SHOULDER ARTHROSCOPY WITH SUBACROMIAL DECOMPRESSION AND BICEP TENDON REPAIR Left  10/06/2013   Procedure: LEFT SHOULDER ARTHROSCOPY DEBRIDEMENT EXTENTSIVE,DISTAL CLAVICULECTOMY,DECOMPRESSION SUBACROMIAL PARTIAL ACROMIOPLASTY WITH ROTATOR CUFF REPAIR, and excision of CALCIUM DEPOSIT;  Surgeon: Loreta Ave, MD;  Location: [facility];  Service: Orthopedics;  Laterality: Left;  . SHOULDER ARTHROSCOPY WITH SUBACROMIAL DECOMPRESSION, ROTATOR CUFF REPAIR AND BICEP TENDON REPAIR Right 08/18/2013   Procedure: RIGHT SHOULDER ARTHROSCOPY WITH SUBACROMIAL DECOMPRESSION/PARTIAL ACROMIOPLASTY WITH CORACOACROMIAL RELEASE/DISTAL CLAVICULECTOMY/ ROTATOR CUFF REPAIR/DEBRIDEMENT EXTENTSIVE;  Surgeon: Loreta Ave, MD;  Location: [facility];  Service: Orthopedics;  Laterality: Right;  ANESTHESIA: GENERAL, PRE/POST OP SCALENE  . TONSILLECTOMY         Family History  Problem Relation Age of Onset  . Diabetes Mother   . Pneumonia Father     Social History   Tobacco Use  . Smoking status: Never Smoker  . Smokeless tobacco: Current User    Types: Snuff  Substance Use Topics  . Alcohol use: Yes    Comment: 2-7 beers per day, starting in Oct 2019.  . Drug use: No    Home Medications Prior to Admission medications   Medication Sig Start Date End Date Taking? Authorizing Provider  acetaminophen (TYLENOL) 500 MG tablet Take 1,000 mg by mouth every  6 (six) hours as needed for moderate pain.    [provider]  bumetanide (BUMEX) 1 MG tablet Take 2 mg ( total of 2 tablets) in the morning and 2 mg ( total of 2 tablets)  In the evening 06/13/19   Alwyn Ren, MD  calcium carbonate (CALCIUM 600) 600 MG TABS tablet Take 1 tablet (600 mg total) by mouth 2 (two) times daily with a meal. 12/27/18 12/27/19  [name], MD  Cholecalciferol (VITAMIN D3) 125 MCG (5000 UT) CAPS Take 1 capsule (5,000 Units total) by mouth daily with breakfast. Take along with calcium and magnesium. 12/27/18 12/27/19  [name], MD  cyclobenzaprine (FLEXERIL) 10  MG tablet Take 1 tablet (10 mg total) by mouth at bedtime. 03/26/20 09/22/20  [name], MD  escitalopram (LEXAPRO) 20 MG tablet Take 20 mg by mouth daily. 05/31/19   [provider]  esomeprazole (NEXIUM) 40 MG capsule Take 1 capsule (40 mg total) by mouth daily. Patient taking differently: Take 40 mg by mouth every evening.  12/27/18 12/27/19  [name], MD  GNP MILK OF MAGNESIA 1200 MG/15ML suspension Take 5 mLs by mouth daily as needed for mild constipation.  05/05/18   [provider]  lubiprostone (AMITIZA) 24 MCG capsule Take 1 capsule (24 mcg total) by mouth 2 (two) times daily with a meal. Swallow the medication whole. Do not break or chew the medication. 03/27/19 09/23/19  [name], MD  Magnesium Oxide 500 MG CAPS Take 1 capsule (500 mg total) by mouth 2 (two) times daily at 8 am and 10 pm. 12/27/18 12/27/19  [name], MD  Magnesium Oxide 500 MG TABS Take 1 tablet by mouth 2 (two) times daily. 07/29/19   [provider]  NON FORMULARY 1,268.8 mcg by Intrathecal route daily. IT pump Fentanyl 2,000.0 mcg/ml Bupivicaine 20.0 mg/ml Baclofen 200.0 mcg/ml 24 dose 1268.8 mcg/day 40 ml pump    [provider]  ondansetron (ZOFRAN-ODT) 8 MG disintegrating tablet Take 1 tablet (8 mg total) by mouth every 8 (eight) hours as needed for nausea or vomiting. 03/26/20 09/22/20  [name], MD  Oxycodone HCl 10 MG TABS Take 1 tablet (10 mg total) by mouth every 6 (six) hours as needed. Must last 30 days Patient taking differently: Take 10 mg by mouth every 6 (six) hours as needed (pain). Must last 30 days 07/25/19 08/24/19  [name], MD  Oxycodone HCl 10 MG TABS Take 1 tablet (10 mg total) by mouth every 6 (six) hours as needed. Must last 30 days 08/24/19 09/23/19  [name], MD  Oxycodone HCl 10 MG TABS Take 1 tablet (10 mg total) by mouth every 6 (six) hours as needed. Must last 30 days 09/23/19 10/23/19  [name], MD  pregabalin (LYRICA) 150 MG capsule Take 1 capsule (150 mg total) by mouth 3 (three) times daily. 03/26/20 09/22/20  [name], MD  promethazine (PHENERGAN) 25 MG tablet Take 25-50 mg by mouth every 6 (six) hours as needed for nausea or vomiting.  05/26/19   [provider]  rosuvastatin (CRESTOR) 10 MG tablet Take 10 mg by mouth daily. 07/25/19   [provider]  tiZANidine (ZANAFLEX) 4 MG tablet Take 1 tablet (4 mg total) by mouth every 8 (eight) hours as needed for muscle spasms. 03/26/20 09/22/20  [name], MD    Allergies    Tape  Review of Systems   Review of Systems  Constitutional: Negative for activity change, chills and fever.  HENT: Negative for ear pain and sore throat.   Eyes: Negative for pain and visual disturbance.  Respiratory: Negative for cough and shortness of breath.   Cardiovascular: Negative for chest pain and palpitations.  Gastrointestinal: Negative for abdominal pain and vomiting.  Genitourinary: Negative for dysuria and hematuria.  Musculoskeletal: Negative for arthralgias and back pain.  Skin: Positive for wound. Negative for color change and rash.  Neurological: Negative for dizziness, seizures, syncope, weakness, light-headedness, numbness and headaches.  Psychiatric/Behavioral: Negative for confusion.  All other systems reviewed and are negative.   Physical Exam Updated Vital Signs BP 113/67 (BP Location: Left Arm)   Pulse 91   Temp 98 F (36.7 C) (Oral)   Resp (!) 24   Ht 5\' 11"  (1.803 m)   Wt (!) 187.3 kg   SpO2 92%   BMI 57.60 kg/m   Physical Exam Vitals and nursing note reviewed.  Constitutional:      General: He is not in acute distress.    Appearance: He is well-developed. He is obese. He is not ill-appearing, toxic-appearing or diaphoretic.  HENT:     Head: Normocephalic and atraumatic.     Comments: Large 6 to 7 cm long and 3cm deep laceration to the right chin.  No evidence of foreign  bodies, normal sensation and range of motion of lower jaw.  There is also a 1 cm diameter puncture in the right lower gum/gumline.  No evidence of tooth fracture. No evidence of skull deformities, stepoffs. Mild TTP to maxilla bilaterally     Ears:     Comments: No hemotympanum bilaterally     Nose: Nose normal.     Mouth/Throat:     Mouth: Mucous membranes are moist.     Pharynx: Oropharynx is clear.     Comments: Normal tongue, no evidence of hematoma Eyes:     Conjunctiva/sclera: Conjunctivae normal.     Comments: No maxillary bruising, no racoon eyes   Neck:     Comments: No C spine midline tenderness Cardiovascular:     Rate and Rhythm: Normal rate and regular rhythm.     Pulses: Normal pulses.     Heart sounds: Normal heart sounds. No murmur.  Pulmonary:     Effort: Pulmonary effort is normal. No respiratory distress.     Breath sounds: Normal breath sounds.  Abdominal:     General: Abdomen is flat.     Palpations: Abdomen is soft.     Tenderness: There is no abdominal tenderness.  Musculoskeletal:     Cervical back: Normal range of motion and neck supple. No rigidity or tenderness.     Comments: No T or Lspine midline tenderness. No evidence of crepitus, stepoffs   Skin:    General: Skin is warm and dry.  Neurological:     General: No focal deficit present.     Mental Status: He is alert and oriented to person, place, and time.     Sensory: No sensory deficit.     Motor: No weakness.  Psychiatric:        Mood and Affect: Mood normal.        Behavior: Behavior normal.         ED Results / Procedures / Treatments   Labs (all labs ordered are listed, but only abnormal results are displayed) Labs Reviewed - No data to display  EKG None  Radiology CT Head Wo Contrast  Result Date: 08/25/2019 CLINICAL DATA:  Fall, hit face EXAM: CT HEAD WITHOUT  CONTRAST TECHNIQUE: Contiguous axial images were obtained from the base of the skull through the vertex without  intravenous contrast. COMPARISON:  07/11/2018 FINDINGS: Brain: No acute intracranial abnormality. Specifically, no hemorrhage, hydrocephalus, mass lesion, acute infarction, or significant intracranial injury. Vascular: No hyperdense vessel or unexpected calcification. Skull: No acute calvarial abnormality. Sinuses/Orbits: Visualized paranasal sinuses and mastoids clear. Orbital soft tissues unremarkable. Other: None IMPRESSION: No acute intracranial abnormality. Electronically Signed   By: Charlett Nose M.D.   On: 08/25/2019 21:03   CT Maxillofacial Wo Contrast  Result Date: 08/25/2019 CLINICAL DATA:  Fall, hit face EXAM: CT MAXILLOFACIAL WITHOUT CONTRAST TECHNIQUE: Multidetector CT imaging of the maxillofacial structures was performed. Multiplanar CT image reconstructions were also generated. COMPARISON:  None. FINDINGS: Osseous: No fracture or mandibular dislocation. No destructive process. Orbits: Negative. No traumatic or inflammatory finding. Sinuses: Clear Soft tissues: Soft tissue laceration in the right chin. Limited intracranial: No acute findings IMPRESSION: Right chin soft tissue laceration.  No facial or orbital fracture Electronically Signed   By: Charlett Nose M.D.   On: 08/25/2019 21:04    Procedures .Marland KitchenLaceration Repair  Date/Time: 08/27/2019 12:02 AM Performed by: Mare Ferrari, PA-C Authorized by: Mare Ferrari, PA-C   Consent:    Consent obtained:  Verbal   Consent given by:  Patient   Risks discussed:  Infection, need for additional repair, pain, poor cosmetic result, poor wound healing and nerve damage   Alternatives discussed:  No treatment and delayed treatment Universal protocol:    Procedure explained and questions answered to patient or proxy's satisfaction: yes     Relevant documents present and verified: yes     Test results available and properly labeled: yes     Imaging studies available: yes     Required blood products, implants, devices, and special equipment  available: yes     Site/side marked: yes     Immediately prior to procedure, a time out was called: yes     Patient identity confirmed:  Verbally with patient Anesthesia (see MAR for exact dosages):    Anesthesia method:  Local infiltration   Local anesthetic:  Lidocaine 1% WITH epi Laceration details:    Location:  Face   Face location:  Chin   Length (cm):  6   Depth (mm):  3 Repair type:    Repair type:  Intermediate Pre-procedure details:    Preparation:  Patient was prepped and draped in usual sterile fashion and imaging obtained to evaluate for foreign bodies Exploration:    Hemostasis achieved with:  Direct pressure and epinephrine   Wound exploration: wound explored through full range of motion and entire depth of wound probed and visualized     Wound extent: no foreign bodies/material noted, no nerve damage noted, no underlying fracture noted and no vascular damage noted     Contaminated: no   Treatment:    Area cleansed with:  Betadine and saline   Amount of cleaning:  Extensive   Irrigation solution:  Sterile saline   Irrigation volume:  30cc   Irrigation method:  Syringe   Visualized foreign bodies/material removed: yes   Subcutaneous repair:    Suture size:  3-0   Suture material:  Vicryl   Suture technique:  Simple interrupted   Number of sutures:  3 Skin repair:    Repair method:  Sutures   Suture size:  3-0   Suture material:  Chromic gut   Suture technique:  Simple interrupted   Number of sutures:  7 Approximation:    Approximation:  Close Post-procedure details:    Dressing:  Open (no dressing)   Patient tolerance of procedure:  Tolerated well, no immediate complications   (including critical care time)  Medications Ordered in ED Medications  lidocaine-EPINEPHrine (XYLOCAINE W/EPI) 2 %-1:200000 (PF) injection 20 mL (10 mLs Infiltration Given by Other 08/25/19 2250)  Tdap (BOOSTRIX) injection 0.5 mL (0.5 mLs Intramuscular Given 08/25/19 2330)    ED  Course  I have reviewed the triage vital signs and the nursing notes.  Pertinent labs & imaging results that were available during my care of the patient were reviewed by me and considered in my medical decision making (see chart for details).    MDM Rules/Calculators/A&P                     54 year old male with laceration to chin and inner cheek after fall On presentation to the ER, patient alert and oriented, and in no acute distress, requesting to lay flat on the bed secondary to discomfort.  Physical exam positive for large laceration to chin.  CT head and maxillofacial without evidence of fractures or intracranial bleeds.  Chin laceration successfully repaired in the ER without complications. No indication for repair for puncture wound in oral cavity.  Informed the patient to return to the ER follow-up with his PCP for suture removal in 3 to 5 days.  His wife refers that he has an appointment with his PCP in 5 days. I encouraged him to keep a close eye on the laceration in his mouth and return if he notices any signs of infection or has increasing pain.  No indication for antibiotics at this time.  Strict return precautions given, educated on signs of infection.  Patient received Tdap booster in the ER.  Patient and wife at bedside voiced understanding and are agreeable to this plan.  At this stage in ED course, the patient is medically screened and stable for discharge.  He is scheduled to continue getting worked up for recent falls and drop in O2 sats.  Patient was seen and evaluated by Dr. Dalene SeltzerSchlossman and she is agreeable to the above plan.  Final Clinical Impression(s) / ED Diagnoses Final diagnoses:  Fall, initial encounter  Facial laceration, initial encounter    Rx / DC Orders ED Discharge Orders    None       Leone BrandBelaya, Illyana Schorsch A, PA-C 08/27/19 [id number]    Alvira MondaySchlossman, Erin, MD 08/27/19 2239

## 2019-09-05 ENCOUNTER — Ambulatory Visit (HOSPITAL_COMMUNITY)
Admission: RE | Admit: 2019-09-05 | Discharge: 2019-09-05 | Disposition: A | Discharge status: Home / Self Care | Payer: 59 | Source: Ambulatory Visit | Attending: Internal Medicine | Admitting: Internal Medicine

## 2019-09-05 ENCOUNTER — Other Ambulatory Visit: Payer: Self-pay

## 2019-09-05 ENCOUNTER — Encounter (HOSPITAL_COMMUNITY): Payer: Self-pay

## 2019-09-05 VITALS — BP 132/86 | HR 84 | Ht 71.0 in | Wt >= 6400 oz

## 2019-09-05 DIAGNOSIS — K219 Gastro-esophageal reflux disease without esophagitis: Secondary | ICD-10-CM | POA: Insufficient documentation

## 2019-09-05 DIAGNOSIS — M1990 Unspecified osteoarthritis, unspecified site: Secondary | ICD-10-CM | POA: Diagnosis not present

## 2019-09-05 DIAGNOSIS — Z79899 Other long term (current) drug therapy: Secondary | ICD-10-CM | POA: Diagnosis not present

## 2019-09-05 DIAGNOSIS — G8929 Other chronic pain: Secondary | ICD-10-CM | POA: Diagnosis not present

## 2019-09-05 DIAGNOSIS — M545 Low back pain: Secondary | ICD-10-CM | POA: Diagnosis not present

## 2019-09-05 DIAGNOSIS — J449 Chronic obstructive pulmonary disease, unspecified: Secondary | ICD-10-CM | POA: Diagnosis not present

## 2019-09-05 DIAGNOSIS — I5032 Chronic diastolic (congestive) heart failure: Secondary | ICD-10-CM

## 2019-09-05 LAB — BASIC METABOLIC PANEL
Anion gap: 9 (ref 5–15)
BUN: 8 mg/dL (ref 6–20)
CO2: 29 mmol/L (ref 22–32)
Calcium: 8.5 mg/dL — ABNORMAL LOW (ref 8.9–10.3)
Chloride: 100 mmol/L (ref 98–111)
Creatinine, Ser: 0.75 mg/dL (ref 0.61–1.24)
GFR calc Af Amer: >60 mL/min (ref >60)
GFR calc non Af Amer: >60 mL/min (ref >60)
Glucose, Bld: 125 mg/dL — ABNORMAL HIGH (ref 70–99)
Potassium: 4.7 mmol/L (ref 3.5–5.1)
Sodium: 138 mmol/L (ref 135–145)

## 2019-09-05 LAB — BRAIN NATRIURETIC PEPTIDE: B Natriuretic Peptide: 212.8 pg/mL — ABNORMAL HIGH (ref 0.0–100.0)

## 2019-09-05 NOTE — Progress Notes (Addendum)
ADVANCED [facility] CLINIC CONSULT NOTE  Referring Physician: Dr. Jacques Navy  Primary Care: [name], MD Primary Cardiologist: Dr. Jacques Navy  Reason for Visit: New Pt Evaluation for Chronic Diastolic Heart Failure   HPI:  54 y/o male w/ obesity, chronic diastolic heart failure, OSA noncompliant w/ CPAP and chronic LBP on chronic opiods. Referred by Dr. Jacques Navy for further management of his chronic diastolic HF. Management of volume status has been challenging. Outpatient diuretic regimen = Bumex 2 mg bid. Has normal renal function. Echo 2/21 showed normal LVEF 60-65%, no LVH. RV normal. No significant valvular disease. RVSP estimate not outlined in study report. His most recent hospitalization was 06/2019 for a/c CHF, requiring treatment w/ IV diuretics. His discharge wt was 394 lb.   He presents to clinic today w/ his wife. Ambulating w/ rolling walker. SOB w/ activity. Improves w/ rest. Sedentary at baseline. Functional limitation multifactorial from morbid obesity, CHF, deconditioning and chronic back pain.   Since being discharged from hospital, he has had steady wt gain, which he believes is all fluid. Wt today is 434 lb. Reports daily compliance w/ Bumex but poor urinary response. He is watching sodium intake and tries to fluid restrict. Sleeps w/ head of bed elevated due to orthopnea and chronic back pain. BP is well controlled.    Social: Disabled after hurting his back. Lives w/ wife (she just completed treatment for cancer). They have 2 children. Daughter is a Holiday representative in high school and plans to attend [employer] in the fall. Their son has cerebral palsy     2D Echo 2/21 1. Left ventricular ejection fraction, by estimation, is 60 to 65%. The left ventricle has normal function. The left ventricle has no regional wall motion abnormalities. Left ventricular diastolic parameters were normal. 2. Right ventricular systolic function is normal. The right ventricular size is normal. 3. The mitral  valve is normal in structure and function. No evidence of mitral valve regurgitation. No evidence of mitral stenosis. 4. The aortic valve is normal in structure and function. Aortic valve regurgitation is not visualized. No aortic stenosis is present. 5. The inferior vena cava is normal in size with greater than 50% respiratory variability, suggesting right atrial pressure of 3 mmHg.     Review of Systems: [y] = yes, [ ]  = no   General: Weight gain [ ] ; Weight loss [ ] ; Anorexia [ ] ; Fatigue [ ] ; Fever [ ] ; Chills [ ] ; Weakness [ ]   Cardiac: Chest pain/pressure [ ] ; Resting SOB [ ] ; Exertional SOB [ ] ; Orthopnea [ ] ; Pedal Edema [ ] ; Palpitations [ ] ; Syncope [ ] ; Presyncope [ ] ; Paroxysmal nocturnal dyspnea[ ]   Pulmonary: Cough [ ] ; Wheezing[ ] ; Hemoptysis[ ] ; Sputum [ ] ; Snoring [ ]   GI: Vomiting[ ] ; Dysphagia[ ] ; Melena[ ] ; Hematochezia [ ] ; Heartburn[ ] ; Abdominal pain [ ] ; Constipation [ ] ; Diarrhea [ ] ; BRBPR [ ]   GU: Hematuria[ ] ; Dysuria [ ] ; Nocturia[ ]   Vascular: Pain in legs with walking [ ] ; Pain in feet with lying flat [ ] ; Non-healing sores [ ] ; Stroke [ ] ; TIA [ ] ; Slurred speech [ ] ;  Neuro: Headaches[ ] ; Vertigo[ ] ; Seizures[ ] ; Paresthesias[ ] ;Blurred vision [ ] ; Diplopia [ ] ; Vision changes [ ]   Ortho/Skin: Arthritis [ ] ; Joint pain [ ] ; Muscle pain [ ] ; Joint swelling [ ] ; Back Pain [ ] ; Rash [ ]   Psych: Depression[ ] ; Anxiety[ ]   Heme: Bleeding problems [ ] ; Clotting disorders [ ] ; Anemia [ ]   Endocrine: Diabetes [ ] ; Thyroid dysfunction[ ]    Past Medical History:  Diagnosis Date  . Arthritis   . Asthma    as child  . Back pain   . CHF (congestive heart failure) (HCC)   . COPD (chronic obstructive pulmonary disease) (HCC)   . GERD (gastroesophageal reflux disease)   . Hernia   . History of hiatal hernia   . Hypogonadism in male   . Neuromuscular disorder (HCC)    back injury with surgery  . PONV (postoperative nausea and vomiting)    gets nausea  .  Shortness of breath dyspnea   . Sleep apnea    Does not use CPAP, new equipment 12/2014  . Ulcer   . Wears glasses     Current Outpatient Medications  Medication Sig Dispense Refill  . acetaminophen (TYLENOL) 500 MG tablet Take 1,000 mg by mouth every 6 (six) hours as needed for moderate pain.    . bumetanide (BUMEX) 1 MG tablet Take 2 mg ( total of 2 tablets) in the morning and 2 mg ( total of 2 tablets)  In the evening 180 tablet 6  . calcium carbonate (CALCIUM 600) 600 MG TABS tablet Take 1 tablet (600 mg total) by mouth 2 (two) times daily with a meal. 180 tablet 3  . Cholecalciferol (VITAMIN D3) 125 MCG (5000 UT) CAPS Take 1 capsule (5,000 Units total) by mouth daily with breakfast. Take along with calcium and magnesium. 90 capsule 3  . [START ON 03/26/2020] cyclobenzaprine (FLEXERIL) 10 MG tablet Take 1 tablet (10 mg total) by mouth at bedtime. 90 tablet 1  . escitalopram (LEXAPRO) 20 MG tablet Take 20 mg by mouth daily.    03/28/2020 esomeprazole (NEXIUM) 40 MG capsule Take 1 capsule (40 mg total) by mouth daily. (Patient taking differently: Take 40 mg by mouth every evening. ) 90 capsule 3  . GNP MILK OF MAGNESIA 1200 MG/15ML suspension Take 5 mLs by mouth daily as needed for mild constipation.     Marland Kitchen lubiprostone (AMITIZA) 24 MCG capsule Take 1 capsule (24 mcg total) by mouth 2 (two) times daily with a meal. Swallow the medication whole. Do not break or chew the medication. 60 capsule 5  . Magnesium Oxide 500 MG CAPS Take 1 capsule (500 mg total) by mouth 2 (two) times daily at 8 am and 10 pm. 180 capsule 3  . Magnesium Oxide 500 MG TABS Take 1 tablet by mouth 2 (two) times daily.    . NON FORMULARY 1,268.8 mcg by Intrathecal route daily. IT pump Fentanyl 2,000.0 mcg/ml Bupivicaine 20.0 mg/ml Baclofen 200.0 mcg/ml 24 dose 1268.8 mcg/day 40 ml pump    . [START ON 03/26/2020] ondansetron (ZOFRAN-ODT) 8 MG disintegrating tablet Take 1 tablet (8 mg total) by mouth every 8 (eight) hours as needed  for nausea or vomiting. 90 tablet 5  . Oxycodone HCl 10 MG TABS Take 1 tablet (10 mg total) by mouth every 6 (six) hours as needed. Must last 30 days (Patient taking differently: Take 10 mg by mouth every 6 (six) hours as needed (pain). Must last 30 days) 120 tablet 0  . Oxycodone HCl 10 MG TABS Take 1 tablet (10 mg total) by mouth every 6 (six) hours as needed. Must last 30 days 120 tablet 0  . [START ON 09/23/2019] Oxycodone HCl 10 MG TABS Take 1 tablet (10 mg total) by mouth every 6 (six) hours as needed. Must last 30 days 120 tablet 0  . [START ON 03/26/2020]  pregabalin (LYRICA) 150 MG capsule Take 1 capsule (150 mg total) by mouth 3 (three) times daily. 90 capsule 5  . promethazine (PHENERGAN) 25 MG tablet Take 25-50 mg by mouth every 6 (six) hours as needed for nausea or vomiting.     . rosuvastatin (CRESTOR) 10 MG tablet Take 10 mg by mouth daily.    Melene Muller ON 03/26/2020] tiZANidine (ZANAFLEX) 4 MG tablet Take 1 tablet (4 mg total) by mouth every 8 (eight) hours as needed for muscle spasms. 90 tablet 5   No current facility-administered medications for this encounter.    Allergies  Allergen Reactions  . Tape Other (See Comments)    Blisters - tolerates paper tape well      Social History   Socioeconomic History  . Marital status: Married    Spouse name: Not on file  . Number of children: Not on file  . Years of education: Not on file  . Highest education level: Not on file  Occupational History  . Not on file  Tobacco Use  . Smoking status: Never Smoker  . Smokeless tobacco: Current User    Types: Snuff  Substance and Sexual Activity  . Alcohol use: Yes    Comment: 2-7 beers per day, starting in Oct 2019.  . Drug use: No  . Sexual activity: Yes  Other Topics Concern  . Not on file  Social History Narrative  . Not on file   Social Determinants of Health   Financial Resource Strain:   . Difficulty of Paying Living Expenses:   Food Insecurity:   . Worried About  Programme researcher, broadcasting/film/video in the Last Year:   . Barista in the Last Year:   Transportation Needs:   . Freight forwarder (Medical):   Marland Kitchen Lack of Transportation (Non-Medical):   Physical Activity:   . Days of Exercise per Week:   . Minutes of Exercise per Session:   Stress:   . Feeling of Stress :   Social Connections:   . Frequency of Communication with Friends and Family:   . Frequency of Social Gatherings with Friends and Family:   . Attends Religious Services:   . Active Member of Clubs or Organizations:   . Attends Banker Meetings:   Marland Kitchen Marital Status:   Intimate Partner Violence:   . Fear of Current or Ex-Partner:   . Emotionally Abused:   Marland Kitchen Physically Abused:   . Sexually Abused:       Family History  Problem Relation Age of Onset  . Diabetes Mother   . Pneumonia Father     Vitals:   09/05/19 1002  BP: 132/86  Pulse: 84  SpO2: (!) 79%  Weight: (!) 197.1 kg (434 lb 9.6 oz)  Height: 5\' 11"  (1.803 m)    PHYSICAL EXAM: General:  Chronically ill appearing, super morbidly obese WM. Ambulating w/ walker. No respiratory difficulty at rest HEENT: normal Neck: supple. Thick neck,  JVD assessment difficult. Carotids 2+ bilat; no bruits. No lymphadenopathy or thryomegaly appreciated. Cor: PMI nondisplaced. Regular rate & rhythm. No rubs, gallops or murmurs. Lungs: clear, no wheezing  Abdomen: obese/ edematous soft, nontender, nondistended. No hepatosplenomegaly. No bruits or masses. Good bowel sounds. Extremities: no cyanosis, clubbing, rash, obese extremities w/ 2+ bilateral LEE edema R>L  Neuro: alert & oriented x 3, cranial nerves grossly intact. moves all 4 extremities w/o difficulty. Affect pleasant.  1. Acute on Chronic Diastolic Heart Failure/ Suspect Prominent RV Failure: -  Echo 2/21 showed normal LVEF 60-65%, no LVH. RV normal. No significant valvular disease. RVSP estimate not outlined in study report. Despite report, suspect he has prominent  RV dysfunction and pulmonary HTN, likely multifactorial. Suspect combination WHO Group 3 from untreated OSA/ OHS, Group 2 (diastolic dysfunction) and possibly Group 4 (w/ sedentary lifestyle and chronic LEE ?DVT>>CTEPH). He will need RHC once diuresis for further assessment.  - NYHA Class IIIb w/ marked volume overload. Edema is mostly right sided - Will refer to [facility] for IV Lasix at home, 80 mg bid x 4 days, w/ close f/u back in [facility] later this week to reassess volume status. After IV Lasix, will plan to transition off Bumex to BID torsemide.  - Will see if [facility] can also place unna boots  - Once better diuresed, will arrange for [facility] to assess volume status and filling pressures - Suspect pulmonary HTN as outlined above  - RHC  - Needs to improve compliance w/ CPAP. We discussed this in detail today   - Needs eventual PFTs and V/Q scan after diuresis   - Continue low sodium diet and fluid restriction   - Check BMP and BNP today  - He has consultation w/ pulmonology scheduled in 2 weeks/    F/u w/ Me on 5/28.     ASSESSMENT & PLAN:

## 2019-09-05 NOTE — Patient Instructions (Signed)
Labs today We will only contact you if something comes back abnormal or we need to make some changes. Otherwise no news is good news!  You have been referred to [facility] for CHF management at home (IV LASIX)  Your physician recommends that you schedule a follow-up appointment in: 4 days  in the [facility] ([facility]/NP) Clinic    Do the following things EVERYDAY: 1) Weigh yourself in the morning before breakfast. Write it down and keep it in a log. 2) Take your medicines as prescribed 3) Eat low salt foods--Limit salt (sodium) to 2000 mg per day.  4) Stay as active as you can everyday 5) Limit all fluids for the day to less than 2 liters  At the [facility], you and your health needs are our priority. As part of our continuing mission to provide you with exceptional heart care, we have created designated Provider Care Teams. These Care Teams include your primary Cardiologist (physician) and Advanced Practice Providers (APPs- Physician Assistants and Nurse Practitioners) who all work together to provide you with the care you need, when you need it.   You may see any of the following providers on your designated Care Team at your next follow up: Marland Kitchen Dr Arvilla Meres . Dr Marca Ancona . Tonye Becket, NP . Robbie Lis, PA . Karle Plumber, PharmD   Please be sure to bring in all your medications bottles to every appointment.

## 2019-09-06 ENCOUNTER — Ambulatory Visit: Payer: No Typology Code available for payment source | Attending: Pain Medicine | Admitting: Pain Medicine

## 2019-09-06 ENCOUNTER — Encounter: Payer: Self-pay | Admitting: Pain Medicine

## 2019-09-06 ENCOUNTER — Other Ambulatory Visit: Payer: Self-pay

## 2019-09-06 VITALS — BP 123/81 | HR 87 | Temp 98.1°F | Resp 18 | Ht 70.0 in | Wt >= 6400 oz

## 2019-09-06 DIAGNOSIS — Z6841 Body Mass Index (BMI) 40.0 and over, adult: Secondary | ICD-10-CM | POA: Insufficient documentation

## 2019-09-06 DIAGNOSIS — K5903 Drug induced constipation: Secondary | ICD-10-CM | POA: Diagnosis not present

## 2019-09-06 DIAGNOSIS — M62838 Other muscle spasm: Secondary | ICD-10-CM

## 2019-09-06 DIAGNOSIS — M47896 Other spondylosis, lumbar region: Secondary | ICD-10-CM | POA: Insufficient documentation

## 2019-09-06 DIAGNOSIS — G894 Chronic pain syndrome: Secondary | ICD-10-CM | POA: Diagnosis not present

## 2019-09-06 DIAGNOSIS — M5137 Other intervertebral disc degeneration, lumbosacral region: Secondary | ICD-10-CM | POA: Insufficient documentation

## 2019-09-06 DIAGNOSIS — M961 Postlaminectomy syndrome, not elsewhere classified: Secondary | ICD-10-CM

## 2019-09-06 DIAGNOSIS — M5134 Other intervertebral disc degeneration, thoracic region: Secondary | ICD-10-CM | POA: Insufficient documentation

## 2019-09-06 DIAGNOSIS — M79604 Pain in right leg: Secondary | ICD-10-CM | POA: Insufficient documentation

## 2019-09-06 DIAGNOSIS — G8929 Other chronic pain: Secondary | ICD-10-CM

## 2019-09-06 DIAGNOSIS — M79605 Pain in left leg: Secondary | ICD-10-CM | POA: Insufficient documentation

## 2019-09-06 DIAGNOSIS — M47816 Spondylosis without myelopathy or radiculopathy, lumbar region: Secondary | ICD-10-CM

## 2019-09-06 DIAGNOSIS — Z451 Encounter for adjustment and management of infusion pump: Secondary | ICD-10-CM

## 2019-09-06 DIAGNOSIS — M545 Low back pain: Secondary | ICD-10-CM | POA: Diagnosis not present

## 2019-09-06 DIAGNOSIS — M7918 Myalgia, other site: Secondary | ICD-10-CM | POA: Insufficient documentation

## 2019-09-06 DIAGNOSIS — Z978 Presence of other specified devices: Secondary | ICD-10-CM

## 2019-09-06 MED ORDER — LUBIPROSTONE 24 MCG PO CAPS: 24.0000 ug | ORAL_CAPSULE | Freq: Two times a day (BID) | ORAL | 5 refills | Status: DC

## 2019-09-06 MED ORDER — OXYCODONE HCL 10 MG PO TABS: 10.0000 mg | ORAL_TABLET | Freq: Four times a day (QID) | ORAL | 0 refills | Status: DC | PRN

## 2019-09-06 NOTE — Patient Instructions (Signed)
Opioid Overdose Opioids are drugs that are often used to treat pain. Opioids include illegal drugs, such as heroin, as well as prescription pain medicines, such as codeine, morphine, hydrocodone, oxycodone, and fentanyl. An opioid overdose happens when you take too much of an opioid. An overdose may be intentional or accidental and can happen with any type of opioid. The effects of an overdose can be mild, dangerous, or even deadly. Opioid overdose is a medical emergency. What are the causes? This condition may be caused by:  Taking too much of an opioid on purpose.  Taking too much of an opioid by accident.  Using two or more substances that contain opioids at the same time.  Taking an opioid with a substance that affects your heart, breathing, or blood pressure. These include alcohol, tranquilizers, sleeping pills, illegal drugs, and some over-the-counter medicines. This condition may also happen due to an error made by:  A health care provider who prescribes a medicine.  The pharmacist who fills the prescription order. What increases the risk? This condition is more likely in:  Children. They may be attracted to colorful pills. Because of a child's small size, even a small amount of a drug can be dangerous.  Older people. They may be taking many different drugs. Older people may have difficulty reading labels or remembering when they last took their medicine. They may also be more sensitive to the effects of opioids.  People with chronic medical conditions, especially heart, liver, kidney, or neurological diseases.  People who take an opioid for a long period of time.  People who use: ? Illegal drugs. IV heroin is especially dangerous. ? Other substances, including alcohol, while using an opioid.  People who have: ? A history of drug or alcohol abuse. ? Certain mental health conditions. ? A history of previous drug overdoses.  People who take opioids that are not prescribed  for them. What are the signs or symptoms? Symptoms of this condition depend on the type of opioid and the amount that was taken. Common symptoms include:  Sleepiness or difficulty waking from sleep.  Decrease in attention.  Confusion.  Slurred speech.  Slowed breathing and a slow pulse (bradycardia).  Nausea and vomiting.  Abnormally small pupils. Signs and symptoms that require emergency treatment include:  Cold, clammy, and pale skin.  Blue lips and fingernails.  Vomiting.  Gurgling sounds in the throat.  A pulse that is very slow or difficult to detect.  Breathing that is very irregular, slow, noisy, or difficult to detect.  Limp body.  Inability to respond to speech or be awakened from sleep (stupor).  Seizures. How is this diagnosed? This condition is diagnosed based on your symptoms and medical history. It is important to tell your health care provider:  About all of the opioids that you took.  When you took the opioids.  Whether you were drinking alcohol or using marijuana, cocaine, or other drugs. Your health care provider will do a physical exam. This exam may include:  Checking and monitoring your heart rate and rhythm, breathing rate, temperature, and blood pressure (vital signs).  Measuring oxygen levels in your blood.  Checking for abnormally small pupils. You may also have blood tests or urine tests. You may have X-rays if you are having severe breathing problems. How is this treated? This condition requires immediate medical treatment and hospitalization. Treatment is given in the hospital intensive care (ICU) setting. Supporting your vital signs and your breathing is the first step in   treating an opioid overdose. Treatment may also include:  Giving salts and minerals (electrolytes) along with fluids through an IV.  Inserting a breathing tube (endotracheal tube) in your airway to help you breathe if you cannot breathe on your own or you are in  danger of not being able to breathe on your own.  Giving oxygen through a small tube under your nose.  Passing a tube through your nose and into your stomach (nasogastric tube, or NG tube) to empty your stomach.  Giving medicines that: ? Increase your blood pressure. ? Relieve nausea and vomiting. ? Relieve abdominal pain and cramping. ? Reverse the effects of the opioid (naloxone).  Monitoring your heart and oxygen levels.  Ongoing counseling and mental health support if you intentionally overdosed or used an illegal drug. Follow these instructions at home:  Medicines  Take over-the-counter and prescription medicines only as told by your health care provider.  Always ask your health care provider about possible side effects and interactions of any new medicine that you start taking.  Keep a list of all the medicines that you take, including over-the-counter medicines. Bring this list with you to all your medical visits. General instructions  Drink enough fluid to keep your urine pale yellow.  Keep all follow-up visits as told by your health care provider. This is important. How is this prevented?  Read the drug inserts that come with your opioid pain medicines.  Take medicines only as told by your health care provider. Do not take more medicine than you are told. Do not take medicines more frequently than you are told.  Do not drink alcohol or take sedatives when taking opioids.  Do not use illegal or recreational drugs, including cocaine, ecstasy, and marijuana.  Do not take opioid medicines that are not prescribed for you.  Store all medicines in safety containers that are out of the reach of children.  Get help if you are struggling with: ? Alcohol or drug use. ? Depression or another mental health problem. ? Thoughts of hurting yourself or another person.  Keep the phone number of your local poison control center near your phone or in your mobile phone. In the  [place] the hotline of the National Poison Control Center is (800) 222-1222.  If you were prescribed naloxone, make sure you understand how to take it. Contact a health care provider if you:  Need help understanding how to take your pain medicines.  Feel your medicines are too strong.  Are concerned that your pain medicines are not working well for your pain.  Develop new symptoms or side effects when you are taking medicines. Get help right away if:  You or someone else is having symptoms of an opioid overdose. Get help even if you are not sure.  You have serious thoughts about hurting yourself or others.  You have: ? Chest pain. ? Difficulty breathing. ? A loss of consciousness. These symptoms may represent a serious problem that is an emergency. Do not wait to see if the symptoms will go away. Get medical help right away. Call your local emergency services (911 in the [place] Do not drive yourself to the hospital. If you ever feel like you may hurt yourself or others, or have thoughts about taking your own life, get help right away. You can go to your nearest emergency department or call:  Your local emergency services (911 in the [place]  A suicide crisis helpline, such as the National Suicide Prevention Lifeline at   1-800-273-8255. This is open 24 hours a day. Summary  Opioids are drugs that are often used to treat pain. Opioids include illegal drugs, such as heroin, as well as prescription pain medicines.  An opioid overdose happens when you take too much of an opioid.  Overdoses can be intentional or accidental.  Opioid overdose is very dangerous. It is a life-threatening emergency.  If you or someone you know is experiencing an opioid overdose, get help right away. This information is not intended to replace advice given to you by your health care provider. Make sure you discuss any questions you have with your health care provider. Document Revised: 03/18/2018 Document  Reviewed: 03/18/2018 [employer] Patient Education  2020 [employer]  

## 2019-09-06 NOTE — Progress Notes (Addendum)
PROVIDER NOTE: Information contained herein reflects review and annotations entered in association with encounter. Interpretation of such information and data should be left to medically-trained personnel. Information provided to patient can be located elsewhere in the medical record under "Patient Instructions". Document created using STT-dictation technology, any transcriptional errors that may result from process are unintentional.    Patient: [name]  Service Category: Procedure  Provider: Gaspar Cola, MD  DOB: 12/27/1965  DOS: 09/06/2019  Location: [facility]  MRN: [id number]  Setting: Ambulatory - outpatient  Referring Provider: Antony Contras, MD  Type: Established Patient  Specialty: Interventional Pain Management  PCP: Antony Contras, MD   Primary Reason for Visit: Interventional Pain Management Treatment. CC: Back Pain (lower) and Leg Pain (bilateral, lateral and posterior)  Procedure:          Intrathecal Drug Delivery System (IDDS):  Type: Reservoir Refill [phone]) 10% rate increase Region: Abdominal Laterality: Left  Type of Pump: Medtronic Synchromed II Delivery Route: Intrathecal Type of Pain Treated: Neuropathic/Nociceptive Primary Medication Class: Opioid/opiate  Medication, Concentration, Infusion Program, & Delivery Rate: Please see scanned programming printout.   Indications: 1. Chronic pain syndrome   2. Failed back surgical syndrome   3. DDD (degenerative disc disease), lumbosacral   4. DDD (degenerative disc disease), thoracic   5. Chronic low back pain (Primary Area of Pain) (Bilateral) (R>L)   6. Chronic lower extremity pain (Secondary area of Pain) (Bilateral) (L>R)   7. Lumbar facet syndrome (Bilateral) (R>L)   8. Presence of intrathecal pump   9. Encounter for adjustment or management of infusion pump   10. Severe obesity (BMI >= 40) (HCC)   11. Chronic musculoskeletal pain   12. Muscle spasticity   13. Therapeutic  opioid-induced constipation (OIC)    Pain Assessment: Self-Reported Pain Score: 7 /10             Reported level is compatible with observation.        Pharmacotherapy Assessment  Analgesic: Oxycodone IR 10 mg, 1 tab PO q 6 hrs (40 mg/day of oxycodone)+ 52.9 mcg/hr intrathecal PF-Fentanyl (1,268.8 mcg/day of Fentanyl) MME/day:7m/day(from oral medication)+ 126.96 mg/day (from intrathecal fentanyl) = 186.96MME/day(Aprox. 1.08MME/day/kg).   Monitoring: [place] PMP: PDMP reviewed during this encounter.       Pharmacotherapy: No side-effects or adverse reactions reported. Compliance: No problems identified. Effectiveness: Clinically acceptable. Plan: Refer to "POC".  UDS:  Summary  Date Value Ref Range Status  06/30/2018 FINAL  Final    Comment:    ==================================================================== TOXASSURE SELECT 13 (MW) ==================================================================== Test                             Result       Flag       Units Drug Present and Declared for Prescription Verification   Oxycodone                      955          EXPECTED   ng/mg creat   Oxymorphone                    2975         EXPECTED   ng/mg creat   Noroxycodone                   1684         EXPECTED   ng/mg creat  Noroxymorphone                 596          EXPECTED   ng/mg creat    Sources of oxycodone are scheduled prescription medications.    Oxymorphone, noroxycodone, and noroxymorphone are expected    metabolites of oxycodone. Oxymorphone is also available as a    scheduled prescription medication. Drug Present not Declared for Prescription Verification   Morphine                       141          UNEXPECTED ng/mg creat    Potential sources of morphine include administration of codeine    or morphine, use of heroin, or ingestion of poppy seeds.   Fentanyl                       6            UNEXPECTED ng/mg creat   Norfentanyl                    48            UNEXPECTED ng/mg creat    Source of fentanyl is a scheduled prescription medication,    including IV, patch, and transmucosal formulations. Norfentanyl    is an expected metabolite of fentanyl. ==================================================================== Test                      Result    Flag   Units      Ref Range   Creatinine              233              mg/dL      >=20 ==================================================================== Declared Medications:  The flagging and interpretation on this report are based on the  following declared medications.  Unexpected results may arise from  inaccuracies in the declared medications.  **Note: The testing scope of this panel includes these medications:  Oxycodone  **Note: The testing scope of this panel does not include following  reported medications:  Potassium  Promethazine  Vitamin B1 (Thiamine) ==================================================================== For clinical consultation, please call [phone]. ====================================================================    Intrathecal Pump Therapy Assessment  Manufacturer: Medtronic Synchromed Type: Programmable Volume: 40 mL reservoir MRI compatibility: Yes   Drug content:  Primary Medication Class:Opioid Primary Medication:PF-Fentanyl(2,000.0 mcg/ml) Secondary Medication:PF-Bupivacaine(20.74m/mL) Other Medication:PF-Baclofen (200 mcg/mL) added to the pump on 05/26/2019   Programming:  Type: Simple continuous. See pump readout for details.   Changes:  Medication Change: None at this point Rate Change: 10% increase  Reported side-effects or adverse reactions: None reported  Effectiveness: Described as relatively effective, allowing for increase in activities of daily living (ADL) Clinically meaningful improvement in function (CMIF): Sustained CMIF goals met  Plan: Analysis and programming  Pre-op Assessment:  Mr. [name] a  54y.o. (year old), male patient, seen today for interventional treatment. He  has a past surgical history that includes Back surgery; morphine pump; Tonsillectomy; Joint replacement; Shoulder arthroscopy with subacromial decompression, rotator cuff repair and bicep tendon repair (Right, 08/18/2013); Shoulder arthroscopy with subacromial decompression and bicep tendon repair (Left, 10/06/2013); and Laparoscopic gastric sleeve resection (N/A, 01/08/2015). Mr. HRylanderhas a current medication list which includes the following prescription(s): acetaminophen, bumetanide, calcium carbonate, vitamin d3, [START ON 03/26/2020] cyclobenzaprine, escitalopram, esomeprazole, gnp milk of magnesia, magnesium  oxide, magnesium oxide, NON FORMULARY, [START ON 03/26/2020] ondansetron, [START ON 09/23/2019] oxycodone hcl, [START ON 03/26/2020] pregabalin, promethazine, rosuvastatin, [START ON 03/26/2020] tizanidine, [START ON 09/23/2019] lubiprostone, [START ON 10/23/2019] oxycodone hcl, and [START ON 11/22/2019] oxycodone hcl. His primarily concern today is the Back Pain (lower) and Leg Pain (bilateral, lateral and posterior)  Initial Vital Signs:  Pulse/HCG Rate: 87  Temp: 98.1 F (36.7 C) Resp: 18 BP: 123/81 SpO2: 97 %  BMI: Estimated body mass index is 62.13 kg/m as calculated from the following:   Height as of this encounter: '5\' 10"'  (1.778 m).   Weight as of this encounter: 433 lb (196.4 kg).  Risk Assessment: Allergies: Reviewed. He is allergic to tape.  Allergy Precautions: None required Coagulopathies: Reviewed. None identified.  Blood-thinner therapy: None at this time Active Infection(s): Reviewed. None identified. Mr. Laday is afebrile  Site Confirmation: Mr. Olliff was asked to confirm the procedure and laterality before marking the site Procedure checklist: Completed Consent: Before the procedure and under the influence of no sedative(s), amnesic(s), or anxiolytics, the patient was informed of the  treatment options, risks and possible complications. To fulfill our ethical and legal obligations, as recommended by the American Medical Association's Code of Ethics, I have informed the patient of my clinical impression; the nature and purpose of the treatment or procedure; the risks, benefits, and possible complications of the intervention; the alternatives, including doing nothing; the risk(s) and benefit(s) of the alternative treatment(s) or procedure(s); and the risk(s) and benefit(s) of doing nothing.  Mr. Quam was provided with information about the general risks and possible complications associated with most interventional procedures. These include, but are not limited to: failure to achieve desired goals, infection, bleeding, organ or nerve damage, allergic reactions, paralysis, and/or death.  In addition, he was informed of those risks and possible complications associated to this particular procedure, which include, but are not limited to: damage to the implant; failure to decrease pain; local, systemic, or serious CNS infections, intraspinal abscess with possible cord compression and paralysis, or life-threatening such as meningitis; bleeding; organ damage; nerve injury or damage with subsequent sensory, motor, and/or autonomic system dysfunction, resulting in transient or permanent pain, numbness, and/or weakness of one or several areas of the body; allergic reactions, either minor or major life-threatening, such as anaphylactic or anaphylactoid reactions.  Furthermore, Mr. Markarian was informed of those risks and complications associated with the medications. These include, but are not limited to: allergic reactions (i.e.: anaphylactic or anaphylactoid reactions); endorphine suppression; bradycardia and/or hypotension; water retention and/or peripheral vascular relaxation leading to lower extremity edema and possible stasis ulcers; respiratory depression and/or shortness of breath; decreased  metabolic rate leading to weight gain; swelling or edema; medication-induced neural toxicity; particulate matter embolism and blood vessel occlusion with resultant organ, and/or nervous system infarction; and/or intrathecal granuloma formation with possible spinal cord compression and permanent paralysis.  Before refilling the pump Mr. Alkhatib was informed that some of the medications used in the devise may not be FDA approved for such use and therefore it constitutes an off-label use of the medications.  Finally, he was informed that Medicine is not an exact science; therefore, there is also the possibility of unforeseen or unpredictable risks and/or possible complications that may result in a catastrophic outcome. The patient indicated having understood very clearly. We have given the patient no guarantees and we have made no promises. Enough time was given to the patient to ask questions, all of which were answered to the  patient's satisfaction. Mr. Kersh has indicated that he wanted to continue with the procedure. Attestation: I, the ordering provider, attest that I have discussed with the patient the benefits, risks, side-effects, alternatives, likelihood of achieving goals, and potential problems during recovery for the procedure that I have provided informed consent. Date  Time: 09/06/2019 11:07 AM  Pre-Procedure Preparation:  Monitoring: As per clinic protocol. Respiration, ETCO2, SpO2, BP, heart rate and rhythm monitor placed and checked for adequate function Safety Precautions: Patient was assessed for positional comfort and pressure points before starting the procedure. Time-out: I initiated and conducted the "Time-out" before starting the procedure, as per protocol. The patient was asked to participate by confirming the accuracy of the "Time Out" information. Verification of the correct person, site, and procedure were performed and confirmed by me, the nursing staff, and the patient.  "Time-out" conducted as per Joint Commission's Universal Protocol (UP.01.01.01). Time: 1100  Description of Procedure:          Position: Supine Target Area: Central-port of intrathecal pump. Approach: Anterior, 90 degree angle approach. Area Prepped: Entire Area around the pump implant. DuraPrep (Iodine Povacrylex [0.7% available iodine] and Isopropyl Alcohol, 74% w/w) Safety Precautions: Aspiration looking for blood return was conducted prior to all injections. At no point did we inject any substances, as a needle was being advanced. No attempts were made at seeking any paresthesias. Safe injection practices and needle disposal techniques used. Medications properly checked for expiration dates. SDV (single dose vial) medications used. Description of the Procedure: Protocol guidelines were followed. Two nurses trained to do implant refills were present during the entire procedure. The refill medication was checked by both healthcare providers as well as the patient. The patient was included in the "Time-out" to verify the medication. The patient was placed in position. The pump was identified. The area was prepped in the usual manner. The sterile template was positioned over the pump, making sure the side-port location matched that of the pump. Both, the pump and the template were held for stability. The needle provided in the Medtronic Kit was then introduced thru the center of the template and into the central port. The pump content was aspirated and discarded volume documented. The new medication was slowly infused into the pump, thru the filter, making sure to avoid overpressure of the device. The needle was then removed and the area cleansed, making sure to leave some of the prepping solution back to take advantage of its long term bactericidal properties. The pump was interrogated and programmed to reflect the correct medication, volume, and dosage. The program was printed and taken to the physician  for approval. Once checked and signed by the physician, a copy was provided to the patient and another scanned into the EMR. Vitals:   09/06/19 1102  BP: 123/81  Pulse: 87  Resp: 18  Temp: 98.1 F (36.7 C)  TempSrc: Temporal  SpO2: 97%  Weight: (!) 433 lb (196.4 kg)  Height: '5\' 10"'  (1.778 m)    Start Time: 1100 hrs. End Time: 1120 hrs. Materials & Medications: Medtronic Refill Kit Medication(s): Please see chart orders for details.  Imaging Guidance:          Type of Imaging Technique: None used Indication(s): N/A Exposure Time: No patient exposure Contrast: None used. Fluoroscopic Guidance: N/A Ultrasound Guidance: N/A Interpretation: N/A  Antibiotic Prophylaxis:   Anti-infectives (From admission, onward)   None     Indication(s): None identified  Post-operative Assessment:  Post-procedure Vital Signs:  Pulse/HCG  Rate: 87  Temp: 98.1 F (36.7 C) Resp: 18 BP: 123/81 SpO2: 97 %  EBL: None  Complications: No immediate post-treatment complications observed by team, or reported by patient.  Note: The patient tolerated the entire procedure well. A repeat set of vitals were taken after the procedure and the patient was kept under observation following institutional policy, for this type of procedure. Post-procedural neurological assessment was performed, showing return to baseline, prior to discharge. The patient was provided with post-procedure discharge instructions, including a section on how to identify potential problems. Should any problems arise concerning this procedure, the patient was given instructions to immediately contact us, at any time, without hesitation. In any case, we plan to contact the patient by telephone for a follow-up status report regarding this interventional procedure.  Comments:  No additional relevant information.  Plan of Care  Orders:  Orders Placed This Encounter  Procedures  . PUMP REFILL    Maintain Protocol by having two(2)  healthcare providers during procedure and programming.    Scheduling Instructions:     Please refill intrathecal pump today.    Order Specific Question:   Where will this procedure be performed?    Answer:   [facility]  . PUMP REFILL    Whenever possible schedule on a procedure today.    Standing Status:   Future    Standing Expiration Date:   02/03/2020    Scheduling Instructions:     Please schedule intrathecal pump refill based on pump programming. Avoid schedule intervals of more than 120 days (4 months).    Order Specific Question:   Where will this procedure be performed?    Answer:   [facility]  . Informed Consent Details: Physician/Practitioner Attestation; Transcribe to consent form and obtain patient signature    Provider Attestation: I, Salina Dossie Arbour, MD, (Pain Management Specialist), the physician/practitioner, attest that I have discussed with the patient the benefits, risks, side effects, alternatives, likelihood of achieving goals and potential problems during recovery for the procedure that I have provided informed consent.    Scheduling Instructions:     Procedure: Intrathecal Pump Refill     Attending Physician: Beatriz Chancellor A. Dossie Arbour, MD     Indications: Chronic Pain Syndrome (G89.4)     Transcribe to consent form and obtain patient signature.   Chronic Opioid Analgesic:  Oxycodone IR 10 mg, 1 tab PO q 6 hrs (40 mg/day of oxycodone)+ 52.9 mcg/hr intrathecal PF-Fentanyl (1,268.8 mcg/day of Fentanyl) MME/day:43m/day(from oral medication)+ 126.96 mg/day (from intrathecal fentanyl) = 186.96MME/day(Aprox. 1.08MME/day/kg).   Medications ordered for procedure: Meds ordered this encounter  Medications  . Oxycodone HCl 10 MG TABS    Sig: Take 1 tablet (10 mg total) by mouth every 6 (six) hours as needed. Must last 30 days    Dispense:  120 tablet    Refill:  0    Chronic Pain: STOP Act (Not applicable) Fill 1 day early if closed on refill  date. Do not fill until: 10/23/2019. To last until: 11/22/2019. Avoid benzodiazepines within 8 hours of opioids  . Oxycodone HCl 10 MG TABS    Sig: Take 1 tablet (10 mg total) by mouth every 6 (six) hours as needed. Must last 30 days    Dispense:  120 tablet    Refill:  0    Chronic Pain: STOP Act (Not applicable) Fill 1 day early if closed on refill date. Do not fill until: 11/22/2019. To last until: 12/22/2019. Avoid benzodiazepines within 8  hours of opioids  . lubiprostone (AMITIZA) 24 MCG capsule    Sig: Take 1 capsule (24 mcg total) by mouth 2 (two) times daily with a meal. Swallow the medication whole. Do not break or chew the medication.    Dispense:  60 capsule    Refill:  5    Fill one day early if pharmacy is closed on scheduled refill date. May substitute for generic if available.   Medications administered: Jeneen Rinks B. Colmenares had no medications administered during this visit.  See the medical record for exact dosing, route, and time of administration.  Follow-up plan:   Return for Pump Refill (Max:38mo.       Interventional management options: Considering: NOTE: NO RFA until BMI <35  Palliative/therapeuticintrathecal pump refill and maintenance Diagnostic left Genicular NB Possible left Genicular nerve RFA Possible bilateral lumbar facet RFA(NO Lumbar RFA until BMI <35) Diagnostic caudal ESI+epidurogram Possible Racz procedure Diagnostic bilateral IA shoulder joint injection Diagnostic bilateral suprascapular NB Possible bilateral suprascapular nerve RFA Diagnostic LESI vs TFESI   Palliative PRN treatment(s): Palliative/therapeuticintrathecal pump refill and maintenance Diagnostic bilateral lumbar facet block #2     Recent Visits Date Type Provider Dept  07/19/19 Procedure visit NMilinda Pointer MD [facility]  Showing recent visits within past 90 days and meeting all other requirements   Today's Visits Date Type Provider Dept   09/06/19 Procedure visit NMilinda Pointer MD [facility]  Showing today's visits and meeting all other requirements   Future Appointments Date Type Provider Dept  10/20/19 Appointment NMilinda Pointer MD [facility]  Showing future appointments within next 90 days and meeting all other requirements   Disposition: Discharge home  Discharge (Date  Time): 09/06/2019; 1130 hrs.   Primary Care Physician: SAntony Contras MD [facility] Note by: FGaspar Cola MD Date: 09/06/2019; Time: 12:07 PM  Disclaimer:  Medicine is not an eChief Strategy Officer The only guarantee in medicine is that nothing is guaranteed. It is important to note that the decision to proceed with this intervention was based on the information collected from the patient. The Data and conclusions were drawn from the patient's questionnaire, the interview, and the physical examination. Because the information was provided in large part by the patient, it cannot be guaranteed that it has not been purposely or unconsciously manipulated. Every effort has been made to obtain as much relevant data as possible for this evaluation. It is important to note that the conclusions that lead to this procedure are derived in large part from the available data. Always take into account that the treatment will also be dependent on availability of resources and existing treatment guidelines, considered by other Pain Management Practitioners as being common knowledge and practice, at the time of the intervention. For Medico-Legal purposes, it is also important to point out that variation in procedural techniques and pharmacological choices are the acceptable norm. The indications, contraindications, technique, and results of the above procedure should only be interpreted and judged by a Board-Certified Interventional Pain Specialist with extensive familiarity and expertise in the same exact  procedure and technique.

## 2019-09-07 ENCOUNTER — Telehealth: Payer: Self-pay

## 2019-09-07 NOTE — Telephone Encounter (Signed)
Post pump refill phone call.  Patient states she is doing well.   

## 2019-09-08 MED FILL — Medication: INTRATHECAL | Qty: 1 | Status: AC

## 2019-09-09 ENCOUNTER — Ambulatory Visit (HOSPITAL_COMMUNITY)
Admission: RE | Admit: 2019-09-09 | Discharge: 2019-09-09 | Disposition: A | Discharge status: Home / Self Care | Payer: 59 | Source: Ambulatory Visit | Attending: Adult Health | Admitting: Adult Health

## 2019-09-09 ENCOUNTER — Encounter (HOSPITAL_COMMUNITY): Payer: Self-pay

## 2019-09-09 ENCOUNTER — Other Ambulatory Visit: Payer: Self-pay

## 2019-09-09 VITALS — BP 132/76 | HR 92 | Wt >= 6400 oz

## 2019-09-09 DIAGNOSIS — J449 Chronic obstructive pulmonary disease, unspecified: Secondary | ICD-10-CM | POA: Insufficient documentation

## 2019-09-09 DIAGNOSIS — I5031 Acute diastolic (congestive) heart failure: Secondary | ICD-10-CM

## 2019-09-09 DIAGNOSIS — M545 Low back pain: Secondary | ICD-10-CM | POA: Diagnosis not present

## 2019-09-09 DIAGNOSIS — Z79899 Other long term (current) drug therapy: Secondary | ICD-10-CM | POA: Insufficient documentation

## 2019-09-09 DIAGNOSIS — I5033 Acute on chronic diastolic (congestive) heart failure: Secondary | ICD-10-CM | POA: Diagnosis present

## 2019-09-09 DIAGNOSIS — G8929 Other chronic pain: Secondary | ICD-10-CM | POA: Diagnosis not present

## 2019-09-09 DIAGNOSIS — M1990 Unspecified osteoarthritis, unspecified site: Secondary | ICD-10-CM | POA: Diagnosis not present

## 2019-09-09 DIAGNOSIS — K219 Gastro-esophageal reflux disease without esophagitis: Secondary | ICD-10-CM | POA: Diagnosis not present

## 2019-09-09 DIAGNOSIS — Z9119 Patient's noncompliance with other medical treatment and regimen: Secondary | ICD-10-CM | POA: Insufficient documentation

## 2019-09-09 LAB — BASIC METABOLIC PANEL
Anion gap: 12 (ref 5–15)
BUN: 9 mg/dL (ref 6–20)
CO2: 32 mmol/L (ref 22–32)
Calcium: 8.9 mg/dL (ref 8.9–10.3)
Chloride: 96 mmol/L — ABNORMAL LOW (ref 98–111)
Creatinine, Ser: 0.75 mg/dL (ref 0.61–1.24)
GFR calc Af Amer: >60 mL/min (ref >60)
GFR calc non Af Amer: >60 mL/min (ref >60)
Glucose, Bld: 106 mg/dL — ABNORMAL HIGH (ref 70–99)
Potassium: 4.8 mmol/L (ref 3.5–5.1)
Sodium: 140 mmol/L (ref 135–145)

## 2019-09-09 LAB — BRAIN NATRIURETIC PEPTIDE: B Natriuretic Peptide: 165.9 pg/mL — ABNORMAL HIGH (ref 0.0–100.0)

## 2019-09-09 NOTE — Progress Notes (Signed)
ADVANCED [facility] CLINIC PROGRESS NOTE   Primary Care: [name], MD Primary Cardiologist: Dr. Jacques Navy  Reason for Visit: F/u for Chronic Diastolic Heart Failure   HPI:  54 y/o male w/ obesity, chronic diastolic heart failure, OSA noncompliant w/ CPAP and chronic LBP on chronic opiods. Recently referred by Dr. Jacques Navy for further management of his chronic diastolic HF. Management of volume status has been challenging. Outpatient diuretic regimen = Bumex 2 mg bid. Has normal renal function. Echo 2/21 showed normal LVEF 60-65%, no LVH. RV normal. No significant valvular disease. RVSP estimate not outlined in study report. His most recent hospitalization was 06/2019 for a/c CHF, requiring treatment w/ IV diuretics. His discharge wt was 394 lb.   He was seen for initial consultation on 5/24. Was SOB w/ activity. Improves w/ rest. Sedentary at baseline. Functional limitation multifactorial from morbid obesity, CHF, deconditioning and chronic back pain.   Since being discharged from hospital, he has had steady wt gain, which he believes is all fluid. His clinic wt on 5/24 was up to 434 lb. Reported daily compliance w/ Bumex but poor urinary response. Watching sodium intake and tries to fluid restrict. Sleeps w/ head of bed elevated due to orthopnea and chronic back pain. BP is well controlled.   I referred to remote health for IV Lasix, with plans to set up for RHC once better diuresed. Initial plan was for 80 mg bid IV, but due to conflict w/ other doctor's appointments, he has only been available for remote health to visit once daily.   His wt is down 8 lb but he remains volume overloaded and symptomatic w/ NYHA Class IIIb symptoms. Initial response to IV Lasix was great but subsequent doses have resulted in sluggish response. Per remote health report, his labs yesterday showed stable SCr at 0.74.     Social: Disabled after hurting his back. Lives w/ wife (she just completed treatment for cancer).  They have 2 children. Daughter is a Holiday representative in high school and plans to attend [employer] in the fall. Their son has cerebral palsy     2D Echo 2/21 1. Left ventricular ejection fraction, by estimation, is 60 to 65%. The left ventricle has normal function. The left ventricle has no regional wall motion abnormalities. Left ventricular diastolic parameters were normal. 2. Right ventricular systolic function is normal. The right ventricular size is normal. 3. The mitral valve is normal in structure and function. No evidence of mitral valve regurgitation. No evidence of mitral stenosis. 4. The aortic valve is normal in structure and function. Aortic valve regurgitation is not visualized. No aortic stenosis is present. 5. The inferior vena cava is normal in size with greater than 50% respiratory variability, suggesting right atrial pressure of 3 mmHg.     Review of Systems: [y] = yes, [ ]  = no   General: Weight gain [ ] ; Weight loss [ ] ; Anorexia [ ] ; Fatigue [ ] ; Fever [ ] ; Chills [ ] ; Weakness [ ]   Cardiac: Chest pain/pressure [ ] ; Resting SOB [ ] ; Exertional SOB [ ] ; Orthopnea [ ] ; Pedal Edema [ ] ; Palpitations [ ] ; Syncope [ ] ; Presyncope [ ] ; Paroxysmal nocturnal dyspnea[ ]   Pulmonary: Cough [ ] ; Wheezing[ ] ; Hemoptysis[ ] ; Sputum [ ] ; Snoring [ ]   GI: Vomiting[ ] ; Dysphagia[ ] ; Melena[ ] ; Hematochezia [ ] ; Heartburn[ ] ; Abdominal pain [ ] ; Constipation [ ] ; Diarrhea [ ] ; BRBPR [ ]   GU: Hematuria[ ] ; Dysuria [ ] ; Nocturia[ ]   Vascular: Pain  in legs with walking [ ] ; Pain in feet with lying flat [ ] ; Non-healing sores [ ] ; Stroke [ ] ; TIA [ ] ; Slurred speech [ ] ;  Neuro: Headaches[ ] ; Vertigo[ ] ; Seizures[ ] ; Paresthesias[ ] ;Blurred vision [ ] ; Diplopia [ ] ; Vision changes [ ]   Ortho/Skin: Arthritis [ ] ; Joint pain [ ] ; Muscle pain [ ] ; Joint swelling [ ] ; Back Pain [ ] ; Rash [ ]   Psych: Depression[ ] ; Anxiety[ ]   Heme: Bleeding problems [ ] ; Clotting disorders [ ] ; Anemia [ ]   Endocrine:  Diabetes [ ] ; Thyroid dysfunction[ ]    Past Medical History:  Diagnosis Date  . Arthritis   . Asthma    as child  . Back pain   . CHF (congestive heart failure) (HCC)   . COPD (chronic obstructive pulmonary disease) (HCC)   . GERD (gastroesophageal reflux disease)   . Hernia   . History of hiatal hernia   . Hypogonadism in male   . Neuromuscular disorder (HCC)    back injury with surgery  . PONV (postoperative nausea and vomiting)    gets nausea  . Shortness of breath dyspnea   . Sleep apnea    Does not use CPAP, new equipment 12/2014  . Ulcer   . Wears glasses     Current Outpatient Medications  Medication Sig Dispense Refill  . acetaminophen (TYLENOL) 500 MG tablet Take 1,000 mg by mouth every 6 (six) hours as needed for moderate pain.    . calcium carbonate (CALCIUM 600) 600 MG TABS tablet Take 1 tablet (600 mg total) by mouth 2 (two) times daily with a meal. 180 tablet 3  . Cholecalciferol (VITAMIN D3) 125 MCG (5000 UT) CAPS Take 1 capsule (5,000 Units total) by mouth daily with breakfast. Take along with calcium and magnesium. 90 capsule 3  . [START ON 03/26/2020] cyclobenzaprine (FLEXERIL) 10 MG tablet Take 1 tablet (10 mg total) by mouth at bedtime. 90 tablet 1  . escitalopram (LEXAPRO) 20 MG tablet Take 20 mg by mouth daily.    esomeprazole (NEXIUM) 40 MG capsule Take 1 capsule (40 mg total) by mouth daily. (Patient taking differently: Take 40 mg by mouth every evening. ) 90 capsule 3  . GNP MILK OF MAGNESIA 1200 MG/15ML suspension Take 5 mLs by mouth daily as needed for mild constipation.     ON 09/23/2019] lubiprostone (AMITIZA) 24 MCG capsule Take 1 capsule (24 mcg total) by mouth 2 (two) times daily with a meal. Swallow the medication whole. Do not break or chew the medication. 60 capsule 5  . Magnesium Oxide 500 MG CAPS Take 1 capsule (500 mg total) by mouth 2 (two) times daily at 8 am and 10 pm. 180 capsule 3  . NON FORMULARY 1,395.2 mcg by Intrathecal  route daily. IT pump Fentanyl 2,000.0 mcg/ml Bupivicaine 20.0 mg/ml Baclofen 200.0 mcg/ml 24 dose 1395.2 mcg/day 40 ml pump    . [START ON 03/26/2020] ondansetron (ZOFRAN-ODT) 8 MG disintegrating tablet Take 1 tablet (8 mg total) by mouth every 8 (eight) hours as needed for nausea or vomiting. 90 tablet 5  . [START ON 03/26/2020] pregabalin (LYRICA) 150 MG capsule Take 1 capsule (150 mg total) by mouth 3 (three) times daily. 90 capsule 5  . promethazine (PHENERGAN) 25 MG tablet Take 25-50 mg by mouth every 6 (six) hours as needed for nausea or vomiting.     . rosuvastatin (CRESTOR) 10 MG tablet Take 10 mg by mouth daily.    . [  START ON 03/26/2020] tiZANidine (ZANAFLEX) 4 MG tablet Take 1 tablet (4 mg total) by mouth every 8 (eight) hours as needed for muscle spasms. 90 tablet 5  . [START ON 09/23/2019] Oxycodone HCl 10 MG TABS Take 1 tablet (10 mg total) by mouth every 6 (six) hours as needed. Must last 30 days (Patient not taking: Reported on 09/09/2019) 120 tablet 0  . [START ON 10/23/2019] Oxycodone HCl 10 MG TABS Take 1 tablet (10 mg total) by mouth every 6 (six) hours as needed. Must last 30 days (Patient not taking: Reported on 09/09/2019) 120 tablet 0  . [START ON 11/22/2019] Oxycodone HCl 10 MG TABS Take 1 tablet (10 mg total) by mouth every 6 (six) hours as needed. Must last 30 days (Patient not taking: Reported on 09/09/2019) 120 tablet 0   No current facility-administered medications for this encounter.    Allergies  Allergen Reactions  . Tape Other (See Comments)    Blisters - tolerates paper tape well      Social History   Socioeconomic History  . Marital status: Married    Spouse name: Not on file  . Number of children: Not on file  . Years of education: Not on file  . Highest education level: Not on file  Occupational History  . Not on file  Tobacco Use  . Smoking status: Never Smoker  . Smokeless tobacco: Current User    Types: Snuff  Substance and Sexual Activity  .  Alcohol use: Yes    Comment: 2-7 beers per day, starting in Oct 2019.  . Drug use: No  . Sexual activity: Yes  Other Topics Concern  . Not on file  Social History Narrative  . Not on file   Social Determinants of Health   Financial Resource Strain:   . Difficulty of Paying Living Expenses:   Food Insecurity:   . Worried About Programme researcher, broadcasting/film/video in the Last Year:   . Barista in the Last Year:   Transportation Needs:   . Freight forwarder (Medical):   Marland Kitchen Lack of Transportation (Non-Medical):   Physical Activity:   . Days of Exercise per Week:   . Minutes of Exercise per Session:   Stress:   . Feeling of Stress :   Social Connections:   . Frequency of Communication with Friends and Family:   . Frequency of Social Gatherings with Friends and Family:   . Attends Religious Services:   . Active Member of Clubs or Organizations:   . Attends Banker Meetings:   Marland Kitchen Marital Status:   Intimate Partner Violence:   . Fear of Current or Ex-Partner:   . Emotionally Abused:   Marland Kitchen Physically Abused:   . Sexually Abused:       Family History  Problem Relation Age of Onset  . Diabetes Mother   . Pneumonia Father     Vitals:   09/09/19 1127  BP: 132/76  Pulse: 92  SpO2: 96%  Weight: (!) 193.3 kg (426 lb 2 oz)    PHYSICAL EXAM: General:  Chronically ill appearing, super morbidly obese WM. No respiratory difficulty at rest HEENT: normal Neck: supple. Thick neck,  JVD assessment difficult. Carotids 2+ bilat; no bruits. No lymphadenopathy or thryomegaly appreciated. Cor: PMI nondisplaced. Regular rate & rhythm. No rubs, gallops or murmurs. Lungs: clear, no wheezing  Abdomen: obese/ edematous soft, nontender, nondistended. No hepatosplenomegaly. No bruits or masses. Good bowel sounds. Extremities: no cyanosis, clubbing, rash, obese  extremities w/ 2+ bilateral LEE edema R>L. Rt foot in boot   Neuro: alert & oriented x 3, cranial nerves grossly intact. moves  all 4 extremities w/o difficulty. Affect pleasant.   ASSESSMENT & PLAN:  1. Acute on Chronic Diastolic Heart Failure/ Suspect Prominent RV Failure: - Echo 2/21 showed normal LVEF 60-65%, no LVH. RV normal. No significant valvular disease. RVSP estimate not outlined in study report. Despite report, suspect he has prominent RV dysfunction and pulmonary HTN, likely multifactorial. Suspect combination WHO Group 3 from untreated OSA/ OHS, Group 2 (diastolic dysfunction) and possibly Group 4 (w/ sedentary lifestyle and chronic LEE ?DVT>>CTEPH). He will need RHC once diuresed for further assessment.  - NYHA Class IIIb w/ marked volume overload. Edema is mostly right sided - Continue [facility] for IV Lasix at home, 80 mg bid x 2 more days - Will give 2.5 mg of metolazone x 1 w/ tomorrow's dose of Lasix. Follow BMP daily  - Will check BMP and repeat BNP in clinic today   - After IV Lasix, will plan to transition off Bumex to BID torsemide.  - Once better diuresed, will arrange for RHC to assess volume status and filling pressures - Suspect pulmonary HTN as outlined above  - Plan RHC  - Needs to improve compliance w/ CPAP. We discussed this in detail today   - Needs eventual PFTs and V/Q scan after diuresis   - Continue low sodium diet and fluid restriction   - He has consultation w/ pulmonology scheduled in 2 weeks   F/u w/ me next week. Plan set up for RHC if volume optimized

## 2019-09-09 NOTE — Patient Instructions (Signed)
TAKE METOLAZONE 2.5 MG TOMORROW AM  Furosemide 80 mg IV Twice daily will be given through Monday  Take an extra 40 meq (2 tabs) of Potassium TOMORROW AM  Your physician recommends that you schedule a follow-up appointment in: Wed 6/2 at 11 AM  If you have any questions or concerns before your next appointment please send [place] a message through [place] or call our office at [phone].    TO LEAVE A MESSAGE FOR THE NURSE SELECT OPTION 2, PLEASE LEAVE A MESSAGE INCLUDING: . YOUR NAME . DATE OF BIRTH . CALL BACK NUMBER . REASON FOR CALL**this is important as we prioritize the call backs  YOU WILL RECEIVE A CALL BACK THE SAME DAY AS LONG AS YOU CALL BEFORE 4:00 PM  At the [facility], you and your health needs are our priority. As part of our continuing mission to provide you with exceptional heart care, we have created designated Provider Care Teams. These Care Teams include your primary Cardiologist (physician) and Advanced Practice Providers (APPs- Physician Assistants and Nurse Practitioners) who all work together to provide you with the care you need, when you need it.   You may see any of the following providers on your designated Care Team at your next follow up: Marland Kitchen Dr Arvilla Meres . Dr Marca Ancona . Tonye Becket, NP . Robbie Lis, PA . Karle Plumber, PharmD   Please be sure to bring in all your medications bottles to every appointment.

## 2019-09-14 ENCOUNTER — Encounter (HOSPITAL_COMMUNITY): Payer: Self-pay

## 2019-09-14 ENCOUNTER — Other Ambulatory Visit (HOSPITAL_COMMUNITY): Payer: Self-pay | Admitting: Internal Medicine

## 2019-09-14 ENCOUNTER — Ambulatory Visit (HOSPITAL_COMMUNITY)
Admission: RE | Admit: 2019-09-14 | Discharge: 2019-09-14 | Disposition: A | Discharge status: Home / Self Care | Payer: 59 | Source: Ambulatory Visit | Attending: Internal Medicine | Admitting: Internal Medicine

## 2019-09-14 ENCOUNTER — Other Ambulatory Visit: Payer: Self-pay

## 2019-09-14 VITALS — BP 124/82 | HR 86 | Wt >= 6400 oz

## 2019-09-14 DIAGNOSIS — J449 Chronic obstructive pulmonary disease, unspecified: Secondary | ICD-10-CM | POA: Diagnosis not present

## 2019-09-14 DIAGNOSIS — G8929 Other chronic pain: Secondary | ICD-10-CM | POA: Diagnosis not present

## 2019-09-14 DIAGNOSIS — Z9119 Patient's noncompliance with other medical treatment and regimen: Secondary | ICD-10-CM | POA: Diagnosis not present

## 2019-09-14 DIAGNOSIS — G4733 Obstructive sleep apnea (adult) (pediatric): Secondary | ICD-10-CM | POA: Diagnosis not present

## 2019-09-14 DIAGNOSIS — Z79891 Long term (current) use of opiate analgesic: Secondary | ICD-10-CM | POA: Diagnosis not present

## 2019-09-14 DIAGNOSIS — K219 Gastro-esophageal reflux disease without esophagitis: Secondary | ICD-10-CM | POA: Diagnosis not present

## 2019-09-14 DIAGNOSIS — F17220 Nicotine dependence, chewing tobacco, uncomplicated: Secondary | ICD-10-CM | POA: Diagnosis not present

## 2019-09-14 DIAGNOSIS — Z79899 Other long term (current) drug therapy: Secondary | ICD-10-CM | POA: Insufficient documentation

## 2019-09-14 DIAGNOSIS — M545 Low back pain: Secondary | ICD-10-CM | POA: Diagnosis not present

## 2019-09-14 DIAGNOSIS — I5032 Chronic diastolic (congestive) heart failure: Secondary | ICD-10-CM | POA: Diagnosis present

## 2019-09-14 DIAGNOSIS — M1990 Unspecified osteoarthritis, unspecified site: Secondary | ICD-10-CM | POA: Diagnosis not present

## 2019-09-14 LAB — BASIC METABOLIC PANEL
Anion gap: 14 (ref 5–15)
BUN: 12 mg/dL (ref 6–20)
CO2: 35 mmol/L — ABNORMAL HIGH (ref 22–32)
Calcium: 9 mg/dL (ref 8.9–10.3)
Chloride: 86 mmol/L — ABNORMAL LOW (ref 98–111)
Creatinine, Ser: 0.71 mg/dL (ref 0.61–1.24)
GFR calc Af Amer: >60 mL/min (ref >60)
GFR calc non Af Amer: >60 mL/min (ref >60)
Glucose, Bld: 128 mg/dL — ABNORMAL HIGH (ref 70–99)
Potassium: 3.4 mmol/L — ABNORMAL LOW (ref 3.5–5.1)
Sodium: 135 mmol/L (ref 135–145)

## 2019-09-14 LAB — BRAIN NATRIURETIC PEPTIDE: B Natriuretic Peptide: 72.6 pg/mL (ref 0.0–100.0)

## 2019-09-14 MED ORDER — TORSEMIDE 20 MG PO TABS: 40.0000 mg | ORAL_TABLET | Freq: Every day | ORAL | 3 refills | Status: DC

## 2019-09-14 MED ORDER — POTASSIUM CHLORIDE ER 10 MEQ PO CPCR: 20.0000 meq | ORAL_CAPSULE | Freq: Every day | ORAL | 3 refills | Status: DC

## 2019-09-14 NOTE — H&P (View-Only) (Signed)
ADVANCED HF CLINIC PROGRESS NOTE   Primary Care: [name], MD Primary Cardiologist: Dr. Jacques Navy  Reason for Visit: F/u for Chronic Diastolic Heart Failure   HPI:  54 y/o male w/ obesity, chronic diastolic heart failure, OSA noncompliant w/ CPAP and chronic LBP on chronic opiods. Recently referred by Dr. Jacques Navy for further management of his chronic diastolic HF. Management of volume status has been challenging. Outpatient diuretic regimen = Bumex 2 mg bid. Has normal renal function. Echo 2/21 showed normal LVEF 60-65%, no LVH. RV normal. No significant valvular disease. RVSP estimate not outlined in study report. His most recent hospitalization was 06/2019 for a/c CHF, requiring treatment w/ IV diuretics. His discharge wt was 394 lb.   He was seen for initial consultation in [facility] on 5/24. Was SOB w/ activity. Improvement w/ rest. Sedentary at baseline. Functional limitation multifactorial from morbid obesity, CHF, deconditioning and chronic back pain.   Since being discharged from hospital, he has had steady wt gain, which he believes is all fluid. His clinic wt on 5/24 was up to 434 lb. Reported daily compliance w/ Bumex but poor urinary response. Watching sodium intake and tries to fluid restrict. Sleeps w/ head of bed elevated due to orthopnea and chronic back pain. BP is well controlled.   I referred to remote health for IV Lasix, with plans to set up for RHC once better diuresed. He was started on 80 IV lasix BID. Initially had decent response, then UOP slowed. Metolazone was ordered as adjunct to IV Lasix w/ improvement in diuresis. BMP was followed daily and SCr and K remained stable.  He presents back for f/u. His wt is overall down 32 lb. He feels better. His wife has noticed improvement. He seems to be sleeping better. Less orthopnea/PND. BP is stable.   He has transitioned back to Bumex but notes poor urinary response.     Social: Disabled after hurting his back. Lives w/  wife (she just completed treatment for cancer). They have 2 children. Daughter is a Holiday representative in high school and plans to attend [employer] in the fall. Their son has cerebral palsy     2D Echo 2/21 1. Left ventricular ejection fraction, by estimation, is 60 to 65%. The left ventricle has normal function. The left ventricle has no regional wall motion abnormalities. Left ventricular diastolic parameters were normal. 2. Right ventricular systolic function is normal. The right ventricular size is normal. 3. The mitral valve is normal in structure and function. No evidence of mitral valve regurgitation. No evidence of mitral stenosis. 4. The aortic valve is normal in structure and function. Aortic valve regurgitation is not visualized. No aortic stenosis is present. 5. The inferior vena cava is normal in size with greater than 50% respiratory variability, suggesting right atrial pressure of 3 mmHg.     Review of Systems: [y] = yes, [ ]  = no   General: Weight gain [ ] ; Weight loss [ ] ; Anorexia [ ] ; Fatigue [ ] ; Fever [ ] ; Chills [ ] ; Weakness [ ]   Cardiac: Chest pain/pressure [ ] ; Resting SOB [ ] ; Exertional SOB [ ] ; Orthopnea [ ] ; Pedal Edema [ ] ; Palpitations [ ] ; Syncope [ ] ; Presyncope [ ] ; Paroxysmal nocturnal dyspnea[ ]   Pulmonary: Cough [ ] ; Wheezing[ ] ; Hemoptysis[ ] ; Sputum [ ] ; Snoring [ ]   GI: Vomiting[ ] ; Dysphagia[ ] ; Melena[ ] ; Hematochezia [ ] ; Heartburn[ ] ; Abdominal pain [ ] ; Constipation [ ] ; Diarrhea [ ] ; BRBPR [ ]   GU: Hematuria[ ] ;  Dysuria [ ] ; Nocturia[ ]   Vascular: Pain in legs with walking [ ] ; Pain in feet with lying flat [ ] ; Non-healing sores [ ] ; Stroke [ ] ; TIA [ ] ; Slurred speech [ ] ;  Neuro: Headaches[ ] ; Vertigo[ ] ; Seizures[ ] ; Paresthesias[ ] ;Blurred vision [ ] ; Diplopia [ ] ; Vision changes [ ]   Ortho/Skin: Arthritis [ ] ; Joint pain [ ] ; Muscle pain [ ] ; Joint swelling [ ] ; Back Pain [ ] ; Rash [ ]   Psych: Depression[ ] ; Anxiety[ ]   Heme: Bleeding problems [ ] ;  Clotting disorders [ ] ; Anemia [ ]   Endocrine: Diabetes [ ] ; Thyroid dysfunction[ ]    Past Medical History:  Diagnosis Date  . Arthritis   . Asthma    as child  . Back pain   . CHF (congestive heart failure) (HCC)   . COPD (chronic obstructive pulmonary disease) (HCC)   . GERD (gastroesophageal reflux disease)   . Hernia   . History of hiatal hernia   . Hypogonadism in male   . Neuromuscular disorder (HCC)    back injury with surgery  . PONV (postoperative nausea and vomiting)    gets nausea  . Shortness of breath dyspnea   . Sleep apnea    Does not use CPAP, new equipment 12/2014  . Ulcer   . Wears glasses     Current Outpatient Medications  Medication Sig Dispense Refill  . acetaminophen (TYLENOL) 500 MG tablet Take 1,000 mg by mouth every 6 (six) hours as needed for moderate pain.    . Bumetanide (BUMEX PO) Take 1 tablet by mouth daily.    . calcium carbonate (CALCIUM 600) 600 MG TABS tablet Take 1 tablet (600 mg total) by mouth 2 (two) times daily with a meal. 180 tablet 3  . Cholecalciferol (VITAMIN D3) 125 MCG (5000 UT) CAPS Take 1 capsule (5,000 Units total) by mouth daily with breakfast. Take along with calcium and magnesium. 90 capsule 3  . [START ON 03/26/2020] cyclobenzaprine (FLEXERIL) 10 MG tablet Take 1 tablet (10 mg total) by mouth at bedtime. 90 tablet 1  . escitalopram (LEXAPRO) 20 MG tablet Take 20 mg by mouth daily.    esomeprazole (NEXIUM) 40 MG capsule Take 1 capsule (40 mg total) by mouth daily. (Patient taking differently: Take 40 mg by mouth every evening. ) 90 capsule 3  . [START ON 09/23/2019] lubiprostone (AMITIZA) 24 MCG capsule Take 1 capsule (24 mcg total) by mouth 2 (two) times daily with a meal. Swallow the medication whole. Do not break or chew the medication. 60 capsule 5  . metolazone (ZAROXOLYN) 2.5 MG tablet Take 2.5 mg by mouth daily as needed.     . NON FORMULARY 1,395.2 mcg by Intrathecal route daily. IT pump Fentanyl 2,000.0  mcg/ml Bupivicaine 20.0 mg/ml Baclofen 200.0 mcg/ml 24 dose 1395.2 mcg/day 40 ml pump    . [START ON 09/23/2019] Oxycodone HCl 10 MG TABS Take 1 tablet (10 mg total) by mouth every 6 (six) hours as needed. Must last 30 days 120 tablet 0  . [START ON 10/23/2019] Oxycodone HCl 10 MG TABS Take 1 tablet (10 mg total) by mouth every 6 (six) hours as needed. Must last 30 days 120 tablet 0  . [START ON 11/22/2019] Oxycodone HCl 10 MG TABS Take 1 tablet (10 mg total) by mouth every 6 (six) hours as needed. Must last 30 days 120 tablet 0  . potassium chloride (MICRO-K) 10 MEQ CR capsule TAKE AS NEEDED AS DIRECTED by provider    . [  START ON 03/26/2020] pregabalin (LYRICA) 150 MG capsule Take 1 capsule (150 mg total) by mouth 3 (three) times daily. 90 capsule 5  . promethazine (PHENERGAN) 25 MG tablet Take 25-50 mg by mouth every 6 (six) hours as needed for nausea or vomiting.     . rosuvastatin (CRESTOR) 10 MG tablet Take 10 mg by mouth daily.    Melene Muller ON 03/26/2020] tiZANidine (ZANAFLEX) 4 MG tablet Take 1 tablet (4 mg total) by mouth every 8 (eight) hours as needed for muscle spasms. 90 tablet 5   No current facility-administered medications for this encounter.    Allergies  Allergen Reactions  . Tape Other (See Comments)    Blisters - tolerates paper tape well      Social History   Socioeconomic History  . Marital status: Married    Spouse name: Not on file  . Number of children: Not on file  . Years of education: Not on file  . Highest education level: Not on file  Occupational History  . Not on file  Tobacco Use  . Smoking status: Never Smoker  . Smokeless tobacco: Current User    Types: Snuff  Substance and Sexual Activity  . Alcohol use: Yes    Comment: 2-7 beers per day, starting in Oct 2019.  . Drug use: No  . Sexual activity: Yes  Other Topics Concern  . Not on file  Social History Narrative  . Not on file   Social Determinants of Health   Financial Resource Strain:    . Difficulty of Paying Living Expenses:   Food Insecurity:   . Worried About Programme researcher, broadcasting/film/video in the Last Year:   . Barista in the Last Year:   Transportation Needs:   . Freight forwarder (Medical):   Marland Kitchen Lack of Transportation (Non-Medical):   Physical Activity:   . Days of Exercise per Week:   . Minutes of Exercise per Session:   Stress:   . Feeling of Stress :   Social Connections:   . Frequency of Communication with Friends and Family:   . Frequency of Social Gatherings with Friends and Family:   . Attends Religious Services:   . Active Member of Clubs or Organizations:   . Attends Banker Meetings:   Marland Kitchen Marital Status:   Intimate Partner Violence:   . Fear of Current or Ex-Partner:   . Emotionally Abused:   Marland Kitchen Physically Abused:   . Sexually Abused:       Family History  Problem Relation Age of Onset  . Diabetes Mother   . Pneumonia Father     Vitals:   09/14/19 1128  BP: 124/82  Pulse: 86  SpO2: 93%  Weight: (!) 184.3 kg (406 lb 3.2 oz)    PHYSICAL EXAM: General:  super morbidly obese WM. No respiratory difficulty at rest HEENT: normal Neck: supple. Thick neck,  JVD assessment difficult. Carotids 2+ bilat; no bruits. No lymphadenopathy or thryomegaly appreciated. Cor: PMI nondisplaced. Regular rate & rhythm. No rubs, gallops or murmurs. Lungs: CTAB Abdomen: obese but soft, nontender, nondistended. No hepatosplenomegaly. No bruits or masses. Good bowel sounds. Extremities: no cyanosis, clubbing, rash, obese extremities w/ trace bilateral LEE. Rt foot in boot   Neuro: alert & oriented x 3, cranial nerves grossly intact. moves all 4 extremities w/o difficulty. Affect pleasant.   ASSESSMENT & PLAN:  1.  Chronic Diastolic Heart Failure/ Suspect Prominent RV Failure: - Echo 2/21 showed normal LVEF 60-65%,  no LVH. RV normal. No significant valvular disease. RVSP estimate not outlined in study report. Despite report, suspect he has  prominent RV dysfunction and pulmonary HTN, likely multifactorial. Suspect combination WHO Group 3 from untreated OSA/ OHS, Group 2 (diastolic dysfunction) and possibly Group 4 (w/ sedentary lifestyle and chronic LEE ?DVT>>CTEPH). Will need RHC to further assess now that he has diuresed. -  NYHA Class III. Volume status overall improved based on diuresis, however exam assessment difficult due to body habitus/ obesity. He has diuresed 32 lb w/ IV Lasix and metolazone. Appreciate [facility]'s assistance - Will check BMP and repeat BNP  - He notes poor response to Bumex. Will change to torsemide 40 mg daily + KCl 20 mEq daily.  - repeat BMP in 1 week  - Will arrange for RHC w/ Dr. Haroldine Laws or Dr. Aundra Dubin to assess volume status and filling pressures - Suspect pulmonary HTN as outlined above  - Plan RHC  - Needs to improve compliance w/ CPAP. We discussed this in detail today   - Needs eventual PFTs and V/Q scan (he has initial consultation w/ pulmonology on 6/7)   - Continue low sodium diet and fluid restriction     F/u 1-2 weeks post RHC.

## 2019-09-14 NOTE — Patient Instructions (Addendum)
Labs done today. We will contact you only if your labs are abnormal.  STOP Bumex  START Torsemide(20mg ) take 2 tablets(40mg ) by mouth daily.  INCREASE Potassium(7meq) take 2 tablets(79meq) by mouth daily.  No other medication changes were made. Please continue all other medications as prescribed.  Your physician recommends that you schedule a follow-up appointment in: 2 weeks post cath  At the [facility], you and your health needs are our priority. As part of our continuing mission to provide you with exceptional heart care, we have created designated Provider Care Teams. These Care Teams include your primary Cardiologist (physician) and Advanced Practice Providers (APPs- Physician Assistants and Nurse Practitioners) who all work together to provide you with the care you need, when you need it.   You may see any of the following providers on your designated Care Team at your next follow up: Marland Kitchen Dr Arvilla Meres . Dr Marca Ancona . Tonye Becket, NP . Robbie Lis, PA . Karle Plumber, PharmD   Please be sure to bring in all your medications bottles to every appointment.   You are scheduled for a Cardiac Catheterization on Thursday, June 10 with Dr. Arvilla Meres.  1. Please arrive at the [facility] (Main Entrance A) at [facility]: [address] [place], [place] [address] at 10:00 AM (This time is two hours before your procedure to ensure your preparation). Free valet parking service is available.   Special note: Every effort is made to have your procedure done on time. Please understand that emergencies sometimes delay scheduled procedures.  2. Diet: Do not eat solid foods after midnight.  The patient may have clear liquids until 5am upon the day of the procedure.  3. YOU MUST HAVE A PRE PROCEDURE COVID-19 TEST DONE Wednesday June 9TH BEFORE 11AM AT [facility] YOU MUST SELF QUARANTINE UNTIL YOUR PROCEDURE.   Stop  taking, Torsemide (Demadex) Thursday, June 10,  On the morning of your procedure, take any morning medicines NOT listed above.  You may use sips of water.  5. Plan for one night stay--bring personal belongings. 6. Bring a current list of your medications and current insurance cards. 7. You MUST have a responsible person to drive you home. 8. Someone MUST be with you the first 24 hours after you arrive home or your discharge will be delayed. 9. Please wear clothes that are easy to get on and off and wear slip-on shoes.  Thank you for allowing [place] to care for you!   -- [place]

## 2019-09-14 NOTE — Progress Notes (Signed)
 [facility] PROGRESS NOTE   Primary Care: Swayne, David, MD Primary Cardiologist: Dr. Acharya  Reason for Visit: F/u for Chronic Diastolic Heart Failure   HPI:  53 y/o male w/ obesity, chronic diastolic heart failure, OSA noncompliant w/ CPAP and chronic LBP on chronic opiods. Recently referred by Dr. Acharya for further management of his chronic diastolic HF. Management of volume status has been challenging. Outpatient diuretic regimen = Bumex 2 mg bid. Has normal renal function. Echo 2/21 showed normal LVEF 60-65%, no LVH. RV normal. No significant valvular disease. RVSP estimate not outlined in study report. His most recent hospitalization was 06/2019 for a/c CHF, requiring treatment w/ IV diuretics. His discharge wt was 394 lb.   He was seen for initial consultation in [facility] on 5/24. Was SOB w/ activity. Improvement w/ rest. Sedentary at baseline. Functional limitation multifactorial from morbid obesity, CHF, deconditioning and chronic back pain.   Since being discharged from hospital, he has had steady wt gain, which he believes is all fluid. His clinic wt on 5/24 was up to 434 lb. Reported daily compliance w/ Bumex but poor urinary response. Watching sodium intake and tries to fluid restrict. Sleeps w/ head of bed elevated due to orthopnea and chronic back pain. BP is well controlled.   I referred to [facility] for IV Lasix, with plans to set up for RHC once better diuresed. He was started on 80 IV lasix BID. Initially had decent response, then UOP slowed. Metolazone was ordered as adjunct to IV Lasix w/ improvement in diuresis. BMP was followed daily and SCr and K remained stable.  He presents back for f/u. His wt is overall down 32 lb. He feels better. His wife has noticed improvement. He seems to be sleeping better. Less orthopnea/PND. BP is stable.   He has transitioned back to Bumex but notes poor urinary response.     Social: Disabled after hurting his back. Lives w/  wife (she just completed treatment for cancer). They have 2 children. Daughter is a senior in high school and plans to attend [employer] in the fall. Their son has cerebral palsy     2D Echo 2/21 1. Left ventricular ejection fraction, by estimation, is 60 to 65%. The left ventricle has normal function. The left ventricle has no regional wall motion abnormalities. Left ventricular diastolic parameters were normal. 2. Right ventricular systolic function is normal. The right ventricular size is normal. 3. The mitral valve is normal in structure and function. No evidence of mitral valve regurgitation. No evidence of mitral stenosis. 4. The aortic valve is normal in structure and function. Aortic valve regurgitation is not visualized. No aortic stenosis is present. 5. The inferior vena cava is normal in size with greater than 50% respiratory variability, suggesting right atrial pressure of 3 mmHg.     Review of Systems: [y] = yes, [ ] = no   General: Weight gain [ ]; Weight loss [ ]; Anorexia [ ]; Fatigue [ ]; Fever [ ]; Chills [ ]; Weakness [ ]  Cardiac: Chest pain/pressure [ ]; Resting SOB [ ]; Exertional SOB [ ]; Orthopnea [ ]; Pedal Edema [ ]; Palpitations [ ]; Syncope [ ]; Presyncope [ ]; Paroxysmal nocturnal dyspnea[ ]  Pulmonary: Cough [ ]; Wheezing[ ]; Hemoptysis[ ]; Sputum [ ]; Snoring [ ]  GI: Vomiting[ ]; Dysphagia[ ]; Melena[ ]; Hematochezia [ ]; Heartburn[ ]; Abdominal pain [ ]; Constipation [ ]; Diarrhea [ ]; BRBPR [ ]  GU: Hematuria[ ];   Dysuria [ ] ; Nocturia[ ]   Vascular: Pain in legs with walking [ ] ; Pain in feet with lying flat [ ] ; Non-healing sores [ ] ; Stroke [ ] ; TIA [ ] ; Slurred speech [ ] ;  Neuro: Headaches[ ] ; Vertigo[ ] ; Seizures[ ] ; Paresthesias[ ] ;Blurred vision [ ] ; Diplopia [ ] ; Vision changes [ ]   Ortho/Skin: Arthritis [ ] ; Joint pain [ ] ; Muscle pain [ ] ; Joint swelling [ ] ; Back Pain [ ] ; Rash [ ]   Psych: Depression[ ] ; Anxiety[ ]   Heme: Bleeding problems [ ] ;  Clotting disorders [ ] ; Anemia [ ]   Endocrine: Diabetes [ ] ; Thyroid dysfunction[ ]    Past Medical History:  Diagnosis Date  . Arthritis   . Asthma    as child  . Back pain   . CHF (congestive heart failure) (HCC)   . COPD (chronic obstructive pulmonary disease) (HCC)   . GERD (gastroesophageal reflux disease)   . Hernia   . History of hiatal hernia   . Hypogonadism in male   . Neuromuscular disorder (HCC)    back injury with surgery  . PONV (postoperative nausea and vomiting)    gets nausea  . Shortness of breath dyspnea   . Sleep apnea    Does not use CPAP, new equipment 12/2014  . Ulcer   . Wears glasses     Current Outpatient Medications  Medication Sig Dispense Refill  . acetaminophen (TYLENOL) 500 MG tablet Take 1,000 mg by mouth every 6 (six) hours as needed for moderate pain.    . Bumetanide (BUMEX PO) Take 1 tablet by mouth daily.    . calcium carbonate (CALCIUM 600) 600 MG TABS tablet Take 1 tablet (600 mg total) by mouth 2 (two) times daily with a meal. 180 tablet 3  . Cholecalciferol (VITAMIN D3) 125 MCG (5000 UT) CAPS Take 1 capsule (5,000 Units total) by mouth daily with breakfast. Take along with calcium and magnesium. 90 capsule 3  . [START ON 03/26/2020] cyclobenzaprine (FLEXERIL) 10 MG tablet Take 1 tablet (10 mg total) by mouth at bedtime. 90 tablet 1  . escitalopram (LEXAPRO) 20 MG tablet Take 20 mg by mouth daily.    esomeprazole (NEXIUM) 40 MG capsule Take 1 capsule (40 mg total) by mouth daily. (Patient taking differently: Take 40 mg by mouth every evening. ) 90 capsule 3  . [START ON 09/23/2019] lubiprostone (AMITIZA) 24 MCG capsule Take 1 capsule (24 mcg total) by mouth 2 (two) times daily with a meal. Swallow the medication whole. Do not break or chew the medication. 60 capsule 5  . metolazone (ZAROXOLYN) 2.5 MG tablet Take 2.5 mg by mouth daily as needed.     . NON FORMULARY 1,395.2 mcg by Intrathecal route daily. IT pump Fentanyl 2,000.0  mcg/ml Bupivicaine 20.0 mg/ml Baclofen 200.0 mcg/ml 24 dose 1395.2 mcg/day 40 ml pump    . [START ON 09/23/2019] Oxycodone HCl 10 MG TABS Take 1 tablet (10 mg total) by mouth every 6 (six) hours as needed. Must last 30 days 120 tablet 0  . [START ON 10/23/2019] Oxycodone HCl 10 MG TABS Take 1 tablet (10 mg total) by mouth every 6 (six) hours as needed. Must last 30 days 120 tablet 0  . [START ON 11/22/2019] Oxycodone HCl 10 MG TABS Take 1 tablet (10 mg total) by mouth every 6 (six) hours as needed. Must last 30 days 120 tablet 0  . potassium chloride (MICRO-K) 10 MEQ CR capsule TAKE AS NEEDED AS DIRECTED by provider    . [  START ON 03/26/2020] pregabalin (LYRICA) 150 MG capsule Take 1 capsule (150 mg total) by mouth 3 (three) times daily. 90 capsule 5  . promethazine (PHENERGAN) 25 MG tablet Take 25-50 mg by mouth every 6 (six) hours as needed for nausea or vomiting.     . rosuvastatin (CRESTOR) 10 MG tablet Take 10 mg by mouth daily.    Melene Muller ON 03/26/2020] tiZANidine (ZANAFLEX) 4 MG tablet Take 1 tablet (4 mg total) by mouth every 8 (eight) hours as needed for muscle spasms. 90 tablet 5   No current facility-administered medications for this encounter.    Allergies  Allergen Reactions  . Tape Other (See Comments)    Blisters - tolerates paper tape well      Social History   Socioeconomic History  . Marital status: Married    Spouse name: Not on file  . Number of children: Not on file  . Years of education: Not on file  . Highest education level: Not on file  Occupational History  . Not on file  Tobacco Use  . Smoking status: Never Smoker  . Smokeless tobacco: Current User    Types: Snuff  Substance and Sexual Activity  . Alcohol use: Yes    Comment: 2-7 beers per day, starting in Oct 2019.  . Drug use: No  . Sexual activity: Yes  Other Topics Concern  . Not on file  Social History Narrative  . Not on file   Social Determinants of Health   Financial Resource Strain:    . Difficulty of Paying Living Expenses:   Food Insecurity:   . Worried About Programme researcher, broadcasting/film/video in the Last Year:   . Barista in the Last Year:   Transportation Needs:   . Freight forwarder (Medical):   Marland Kitchen Lack of Transportation (Non-Medical):   Physical Activity:   . Days of Exercise per Week:   . Minutes of Exercise per Session:   Stress:   . Feeling of Stress :   Social Connections:   . Frequency of Communication with Friends and Family:   . Frequency of Social Gatherings with Friends and Family:   . Attends Religious Services:   . Active Member of Clubs or Organizations:   . Attends Banker Meetings:   Marland Kitchen Marital Status:   Intimate Partner Violence:   . Fear of Current or Ex-Partner:   . Emotionally Abused:   Marland Kitchen Physically Abused:   . Sexually Abused:       Family History  Problem Relation Age of Onset  . Diabetes Mother   . Pneumonia Father     Vitals:   09/14/19 1128  BP: 124/82  Pulse: 86  SpO2: 93%  Weight: (!) 184.3 kg (406 lb 3.2 oz)    PHYSICAL EXAM: General:  super morbidly obese WM. No respiratory difficulty at rest HEENT: normal Neck: supple. Thick neck,  JVD assessment difficult. Carotids 2+ bilat; no bruits. No lymphadenopathy or thryomegaly appreciated. Cor: PMI nondisplaced. Regular rate & rhythm. No rubs, gallops or murmurs. Lungs: CTAB Abdomen: obese but soft, nontender, nondistended. No hepatosplenomegaly. No bruits or masses. Good bowel sounds. Extremities: no cyanosis, clubbing, rash, obese extremities w/ trace bilateral LEE. Rt foot in boot   Neuro: alert & oriented x 3, cranial nerves grossly intact. moves all 4 extremities w/o difficulty. Affect pleasant.   ASSESSMENT & PLAN:  1.  Chronic Diastolic Heart Failure/ Suspect Prominent RV Failure: - Echo 2/21 showed normal LVEF 60-65%,  no LVH. RV normal. No significant valvular disease. RVSP estimate not outlined in study report. Despite report, suspect he has  prominent RV dysfunction and pulmonary HTN, likely multifactorial. Suspect combination WHO Group 3 from untreated OSA/ OHS, Group 2 (diastolic dysfunction) and possibly Group 4 (w/ sedentary lifestyle and chronic LEE ?DVT>>CTEPH). Will need RHC to further assess now that he has diuresed. -  NYHA Class III. Volume status overall improved based on diuresis, however exam assessment difficult due to body habitus/ obesity. He has diuresed 32 lb w/ IV Lasix and metolazone. Appreciate [facility]'s assistance - Will check BMP and repeat BNP  - He notes poor response to Bumex. Will change to torsemide 40 mg daily + KCl 20 mEq daily.  - repeat BMP in 1 week  - Will arrange for RHC w/ Dr. Haroldine Laws or Dr. Aundra Dubin to assess volume status and filling pressures - Suspect pulmonary HTN as outlined above  - Plan RHC  - Needs to improve compliance w/ CPAP. We discussed this in detail today   - Needs eventual PFTs and V/Q scan (he has initial consultation w/ pulmonology on 6/7)   - Continue low sodium diet and fluid restriction     F/u 1-2 weeks post RHC.

## 2019-09-19 ENCOUNTER — Other Ambulatory Visit (HOSPITAL_COMMUNITY): Payer: Self-pay | Admitting: Internal Medicine

## 2019-09-20 ENCOUNTER — Ambulatory Visit (INDEPENDENT_AMBULATORY_CARE_PROVIDER_SITE_OTHER): Payer: 59 | Admitting: Pulmonary Disease

## 2019-09-20 ENCOUNTER — Encounter: Payer: Self-pay | Admitting: Pulmonary Disease

## 2019-09-20 ENCOUNTER — Other Ambulatory Visit: Payer: Self-pay

## 2019-09-20 VITALS — BP 142/82 | HR 98 | Temp 98.2°F | Ht 70.0 in | Wt >= 6400 oz

## 2019-09-20 DIAGNOSIS — G4733 Obstructive sleep apnea (adult) (pediatric): Secondary | ICD-10-CM

## 2019-09-20 DIAGNOSIS — R0600 Dyspnea, unspecified: Secondary | ICD-10-CM | POA: Diagnosis not present

## 2019-09-20 DIAGNOSIS — R0609 Other forms of dyspnea: Secondary | ICD-10-CM

## 2019-09-20 LAB — TSH: TSH: 1.36 u[IU]/mL (ref 0.35–4.50)

## 2019-09-20 NOTE — Patient Instructions (Signed)
Continue oxygen supplementation at present We will obtain an overnight oximetry Obtain thyroid function test Obtain PFT  Call with significant concerns  We will see you in 4 to 6 weeks  Weight loss efforts as able  Monitor your oxygen regularly to keep your saturations as close to 90 as possible

## 2019-09-20 NOTE — Addendum Note (Signed)
Addended by: Demetrio Lapping E on: 09/20/2019 03:05 PM   Modules accepted: Orders

## 2019-09-20 NOTE — Progress Notes (Signed)
[name]    [id number]    1965/08/09  Primary Care Physician:Swayne, Onalee Hua, MD  Referring Physician: [name], MD [id number] [place],  [place] [address]  Chief complaint:   Patient was diagnosed with obstructive sleep apnea about 7 years ago In for shortness of breath and sleep apnea  HPI:  Diagnosed 7 years ago Used CPAP for few years Has not been using it for the last up to a year Tried it about 6 months ago he is not able to sleep with the CPAP on He currently is using oxygen supplementation as he does desaturate during the day He does snore constantly, daytime sleepiness  Over the last year has gained about 130 pounds of water weight Following up with cardiology He has a cardiac catheterization scheduled for this Thursday He has been diuresed to get rid of water weight He has chronic back injury which limits his activity does require a walker Has had many falls recently  History of diabetes, hypertension, heart failure  Has no underlying lung disease known to him Asthmatic bronchitis in the past, not on any inhalers  Does not smoke Drinks up to 8 beers a day  Outpatient Encounter Medications as of 09/20/2019  Medication Sig  . acetaminophen (TYLENOL) 500 MG tablet Take 1,000 mg by mouth every 6 (six) hours as needed for mild pain or moderate pain.   Marland Kitchen aspirin EC 81 MG tablet Take 81 mg by mouth daily.  . calcium carbonate (CALCIUM 600) 600 MG TABS tablet Take 1 tablet (600 mg total) by mouth 2 (two) times daily with a meal.  . Cholecalciferol (VITAMIN D3) 125 MCG (5000 UT) CAPS Take 1 capsule (5,000 Units total) by mouth daily with breakfast. Take along with calcium and magnesium. (Patient taking differently: Take 5,000 Units by mouth daily with breakfast. Take along with calcium and magnesium.)  . [START ON 03/26/2020] cyclobenzaprine (FLEXERIL) 10 MG tablet Take 1 tablet (10 mg total) by mouth at bedtime.  Marland Kitchen escitalopram  (LEXAPRO) 20 MG tablet Take 10 mg by mouth daily.   Marland Kitchen esomeprazole (NEXIUM) 40 MG capsule Take 1 capsule (40 mg total) by mouth daily. (Patient taking differently: Take 40 mg by mouth every evening. )  . ibuprofen (ADVIL) 200 MG tablet Take 400 mg by mouth every 6 (six) hours as needed for mild pain or moderate pain.  Melene Muller ON 09/23/2019] lubiprostone (AMITIZA) 24 MCG capsule Take 1 capsule (24 mcg total) by mouth 2 (two) times daily with a meal. Swallow the medication whole. Do not break or chew the medication. (Patient taking differently: Take 24 mcg by mouth daily as needed for constipation. Swallow the medication whole. Do not break or chew the medication.)  . metolazone (ZAROXOLYN) 2.5 MG tablet Take 2.5 mg by mouth daily.   . NON FORMULARY 1,395.2 mcg by Intrathecal route daily. IT pump Fentanyl 2,000.0 mcg/ml Bupivicaine 20.0 mg/ml Baclofen 200.0 mcg/ml 24 dose 1395.2 mcg/day 40 ml pump  . [START ON 09/23/2019] Oxycodone HCl 10 MG TABS Take 1 tablet (10 mg total) by mouth every 6 (six) hours as needed. Must last 30 days (Patient taking differently: Take 10 mg by mouth 4 (four) times daily. Must last 30 days)  . [START ON 10/23/2019] Oxycodone HCl 10 MG TABS Take 1 tablet (10 mg total) by mouth every 6 (six) hours as needed. Must last 30 days  . [START ON 11/22/2019] Oxycodone HCl 10 MG TABS  Take 1 tablet (10 mg total) by mouth every 6 (six) hours as needed. Must last 30 days  . potassium chloride (MICRO-K) 10 MEQ CR capsule Take 2 capsules (20 mEq total) by mouth daily. (Patient taking differently: Take 10 mEq by mouth daily. )  . [START ON 03/26/2020] pregabalin (LYRICA) 150 MG capsule Take 1 capsule (150 mg total) by mouth 3 (three) times daily.  . promethazine (PHENERGAN) 25 MG tablet Take 25-50 mg by mouth every 6 (six) hours as needed for nausea or vomiting.   . rosuvastatin (CRESTOR) 10 MG tablet Take 10 mg by mouth daily.  Derrill Memo ON 03/26/2020] tiZANidine (ZANAFLEX) 4 MG tablet Take  1 tablet (4 mg total) by mouth every 8 (eight) hours as needed for muscle spasms.  Marland Kitchen torsemide (DEMADEX) 20 MG tablet Take 2 tablets (40 mg total) by mouth daily.   No facility-administered encounter medications on file as of 09/20/2019.    Allergies as of 09/20/2019 - Review Complete 09/20/2019  Allergen Reaction Noted  . Tape Other (See Comments) 08/16/2013    Past Medical History:  Diagnosis Date  . Arthritis   . Asthma    as child  . Back pain   . CHF (congestive heart failure) ([place])   . COPD (chronic obstructive pulmonary disease) ([place])   . GERD (gastroesophageal reflux disease)   . Hernia   . History of hiatal hernia   . Hypogonadism in male   . Neuromuscular disorder ([place] [facility])    back injury with surgery  . PONV (postoperative nausea and vomiting)    gets nausea  . Shortness of breath dyspnea   . Sleep apnea    Does not use CPAP, new equipment 12/2014  . Ulcer   . Wears glasses     Past Surgical History:  Procedure Laterality Date  . BACK SURGERY    . JOINT REPLACEMENT     Left knee replacement  . LAPAROSCOPIC GASTRIC SLEEVE RESECTION N/A 01/08/2015   Procedure: LAPAROSCOPIC GASTRIC SLEEVE RESECTION;  Surgeon: Johnathan Hausen, MD;  Location: [facility];  Service: General;  Laterality: N/A;  . morphine pump    . SHOULDER ARTHROSCOPY WITH SUBACROMIAL DECOMPRESSION AND BICEP TENDON REPAIR Left 10/06/2013   Procedure: LEFT SHOULDER ARTHROSCOPY DEBRIDEMENT EXTENTSIVE,DISTAL CLAVICULECTOMY,DECOMPRESSION SUBACROMIAL PARTIAL ACROMIOPLASTY WITH ROTATOR CUFF REPAIR, and excision of CALCIUM DEPOSIT;  Surgeon: Ninetta Lights, MD;  Location: [facility];  Service: Orthopedics;  Laterality: Left;  . SHOULDER ARTHROSCOPY WITH SUBACROMIAL DECOMPRESSION, ROTATOR CUFF REPAIR AND BICEP TENDON REPAIR Right 08/18/2013   Procedure: RIGHT SHOULDER ARTHROSCOPY WITH SUBACROMIAL DECOMPRESSION/PARTIAL ACROMIOPLASTY WITH CORACOACROMIAL RELEASE/DISTAL CLAVICULECTOMY/ ROTATOR CUFF  REPAIR/DEBRIDEMENT EXTENTSIVE;  Surgeon: Ninetta Lights, MD;  Location: [facility];  Service: Orthopedics;  Laterality: Right;  ANESTHESIA: GENERAL, PRE/POST OP SCALENE  . TONSILLECTOMY      Family History  Problem Relation Age of Onset  . Diabetes Mother   . Pneumonia Father     Social History   Socioeconomic History  . Marital status: Married    Spouse name: Not on file  . Number of children: Not on file  . Years of education: Not on file  . Highest education level: Not on file  Occupational History  . Not on file  Tobacco Use  . Smoking status: Never Smoker  . Smokeless tobacco: Current User    Types: Snuff, Chew  Substance and Sexual Activity  . Alcohol use: Yes    Comment: 2-7 beers per day, starting in Oct 2019.  Marland Kitchen  Drug use: No  . Sexual activity: Yes  Other Topics Concern  . Not on file  Social History Narrative  . Not on file   Social Determinants of Health   Financial Resource Strain:   . Difficulty of Paying Living Expenses:   Food Insecurity:   . Worried About Programme researcher, broadcasting/film/video in the Last Year:   . Barista in the Last Year:   Transportation Needs:   . Freight forwarder (Medical):   Marland Kitchen Lack of Transportation (Non-Medical):   Physical Activity:   . Days of Exercise per Week:   . Minutes of Exercise per Session:   Stress:   . Feeling of Stress :   Social Connections:   . Frequency of Communication with Friends and Family:   . Frequency of Social Gatherings with Friends and Family:   . Attends Religious Services:   . Active Member of Clubs or Organizations:   . Attends Banker Meetings:   Marland Kitchen Marital Status:   Intimate Partner Violence:   . Fear of Current or Ex-Partner:   . Emotionally Abused:   Marland Kitchen Physically Abused:   . Sexually Abused:     Review of Systems  Constitutional: Positive for fatigue.  Respiratory: Positive for apnea.   Cardiovascular: Positive for leg swelling.  Musculoskeletal: Positive  for arthralgias and back pain.  Psychiatric/Behavioral: Positive for sleep disturbance.    Vitals:   09/20/19 1422  BP: (!) 142/82  Pulse: 98  Temp: 98.2 F (36.8 C)  SpO2: 98%     Physical Exam  Constitutional:  Morbid obesity  HENT:  Head: Normocephalic.  Macroglossia, Mallampati 3, crowded oropharynx  Eyes: Pupils are equal, round, and reactive to light. Right eye exhibits no discharge. Left eye exhibits no discharge.  Neck: No tracheal deviation present. No thyromegaly present.  Cardiovascular: Normal rate and regular rhythm.  Pulmonary/Chest: Effort normal and breath sounds normal. No respiratory distress. He has no wheezes. He has no rales. He exhibits no tenderness.  Neurological: He is alert.  Psychiatric: He has a normal mood and affect.   Data Reviewed: Last echocardiogram February 2021 was within normal limits with an ejection fraction of 60 to 65%, right ventricular systolic function is normal with normal right ventricular size  Assessment:  History of obstructive sleep apnea-intolerant to CPAP  Hypoxemic respiratory failure with saturations consistently in the 70s on total oxygen supplementation was started  Nocturnal desaturated-currently on oxygen supplementation  Morbid obesity which may be associated with a restrictive physiology  Plan/Recommendations: We will obtain an overnight oximetry on his current oxygen segmentation  Obtain PFT  Continue oxygen supplementation at present  Obtain thyroid function test  I did discuss the fact that the CPAP or BiPAP would be the most effective treatment for his obstructive sleep apnea He will require repeat study to ascertain the severity of his sleep disordered breathing if he will consider CPAP treatment again  Obtaining an oximetry will at least ascertain that he is getting adequate supplementation even though oxygen supplementation will not treat his sleep disordered breathing  He has a cardiac  catheterization scheduled for this week  We will follow-up in 4 to 6 weeks  Encouraged to call with any significant concerns  Virl Diamond MD [facility] 09/20/2019, 2:49 PM  CC: [name], MD

## 2019-09-21 ENCOUNTER — Other Ambulatory Visit (HOSPITAL_COMMUNITY)
Admission: RE | Admit: 2019-09-21 | Discharge: 2019-09-21 | Disposition: A | Discharge status: Home / Self Care | Payer: 59 | Source: Ambulatory Visit | Attending: Internal Medicine | Admitting: Internal Medicine

## 2019-09-21 DIAGNOSIS — Z20822 Contact with and (suspected) exposure to covid-19: Secondary | ICD-10-CM | POA: Insufficient documentation

## 2019-09-21 DIAGNOSIS — Z01812 Encounter for preprocedural laboratory examination: Secondary | ICD-10-CM | POA: Diagnosis not present

## 2019-09-21 LAB — SARS CORONAVIRUS 2 (TAT 6-24 HRS): SARS Coronavirus 2: NEGATIVE

## 2019-09-21 LAB — T3: T3, Total: 96 ng/dL (ref 76–181)

## 2019-09-21 LAB — T4: T4, Total: 5.3 ug/dL (ref 4.9–10.5)

## 2019-09-22 ENCOUNTER — Ambulatory Visit (HOSPITAL_COMMUNITY)
Admission: RE | Admit: 2019-09-22 | Discharge: 2019-09-22 | Disposition: A | Discharge status: Home / Self Care | Payer: 59 | Attending: Internal Medicine | Admitting: Internal Medicine

## 2019-09-22 ENCOUNTER — Encounter (HOSPITAL_COMMUNITY)
Admission: RE | Disposition: A | Discharge status: Home / Self Care | Payer: Self-pay | Source: Home / Self Care | Attending: Internal Medicine

## 2019-09-22 ENCOUNTER — Other Ambulatory Visit: Payer: Self-pay

## 2019-09-22 DIAGNOSIS — Z9119 Patient's noncompliance with other medical treatment and regimen: Secondary | ICD-10-CM | POA: Insufficient documentation

## 2019-09-22 DIAGNOSIS — M545 Low back pain: Secondary | ICD-10-CM | POA: Insufficient documentation

## 2019-09-22 DIAGNOSIS — I2721 Secondary pulmonary arterial hypertension: Secondary | ICD-10-CM | POA: Insufficient documentation

## 2019-09-22 DIAGNOSIS — J449 Chronic obstructive pulmonary disease, unspecified: Secondary | ICD-10-CM | POA: Diagnosis not present

## 2019-09-22 DIAGNOSIS — M1990 Unspecified osteoarthritis, unspecified site: Secondary | ICD-10-CM | POA: Insufficient documentation

## 2019-09-22 DIAGNOSIS — Z79899 Other long term (current) drug therapy: Secondary | ICD-10-CM | POA: Insufficient documentation

## 2019-09-22 DIAGNOSIS — I5032 Chronic diastolic (congestive) heart failure: Secondary | ICD-10-CM | POA: Insufficient documentation

## 2019-09-22 DIAGNOSIS — K219 Gastro-esophageal reflux disease without esophagitis: Secondary | ICD-10-CM | POA: Insufficient documentation

## 2019-09-22 DIAGNOSIS — G8929 Other chronic pain: Secondary | ICD-10-CM | POA: Insufficient documentation

## 2019-09-22 DIAGNOSIS — Z6841 Body Mass Index (BMI) 40.0 and over, adult: Secondary | ICD-10-CM | POA: Diagnosis not present

## 2019-09-22 DIAGNOSIS — G4733 Obstructive sleep apnea (adult) (pediatric): Secondary | ICD-10-CM | POA: Insufficient documentation

## 2019-09-22 HISTORY — PX: RIGHT HEART CATH: CATH118263

## 2019-09-22 LAB — POCT I-STAT EG7
Acid-Base Excess: 10 mmol/L — ABNORMAL HIGH (ref 0.0–2.0)
Acid-Base Excess: 12 mmol/L — ABNORMAL HIGH (ref 0.0–2.0)
Acid-Base Excess: 14 mmol/L — ABNORMAL HIGH (ref 0.0–2.0)
Bicarbonate: 37.9 mmol/L — ABNORMAL HIGH (ref 20.0–28.0)
Bicarbonate: 41 mmol/L — ABNORMAL HIGH (ref 20.0–28.0)
Bicarbonate: 43.3 mmol/L — ABNORMAL HIGH (ref 20.0–28.0)
Calcium, Ion: 1.02 mmol/L — ABNORMAL LOW (ref 1.15–1.40)
Calcium, Ion: 1.24 mmol/L (ref 1.15–1.40)
Calcium, Ion: 1.3 mmol/L (ref 1.15–1.40)
HCT: 45 % (ref 39.0–52.0)
HCT: 48 % (ref 39.0–52.0)
HCT: 48 % (ref 39.0–52.0)
Hemoglobin: 15.3 g/dL (ref 13.0–17.0)
Hemoglobin: 16.3 g/dL (ref 13.0–17.0)
Hemoglobin: 16.3 g/dL (ref 13.0–17.0)
O2 Saturation: 68 %
O2 Saturation: 70 %
O2 Saturation: 74 %
Potassium: 3.8 mmol/L (ref 3.5–5.1)
Potassium: 4.3 mmol/L (ref 3.5–5.1)
Potassium: 4.3 mmol/L (ref 3.5–5.1)
Sodium: 141 mmol/L (ref 135–145)
Sodium: 141 mmol/L (ref 135–145)
Sodium: 144 mmol/L (ref 135–145)
TCO2: 40 mmol/L — ABNORMAL HIGH (ref 22–32)
TCO2: 43 mmol/L — ABNORMAL HIGH (ref 22–32)
TCO2: 45 mmol/L — ABNORMAL HIGH (ref 22–32)
pCO2, Ven: 66.3 mmHg — ABNORMAL HIGH (ref 44.0–60.0)
pCO2, Ven: 69.7 mmHg — ABNORMAL HIGH (ref 44.0–60.0)
pCO2, Ven: 71.4 mmHg (ref 44.0–60.0)
pH, Ven: 7.366 (ref 7.250–7.430)
pH, Ven: 7.378 (ref 7.250–7.430)
pH, Ven: 7.391 (ref 7.250–7.430)
pO2, Ven: 38 mmHg (ref 32.0–45.0)
pO2, Ven: 39 mmHg (ref 32.0–45.0)
pO2, Ven: 42 mmHg (ref 32.0–45.0)

## 2019-09-22 LAB — BASIC METABOLIC PANEL
Anion gap: 13 (ref 5–15)
BUN: 9 mg/dL (ref 6–20)
CO2: 30 mmol/L (ref 22–32)
Calcium: 8.9 mg/dL (ref 8.9–10.3)
Chloride: 94 mmol/L — ABNORMAL LOW (ref 98–111)
Creatinine, Ser: 0.74 mg/dL (ref 0.61–1.24)
GFR calc Af Amer: >60 mL/min (ref >60)
GFR calc non Af Amer: >60 mL/min (ref >60)
Glucose, Bld: 113 mg/dL — ABNORMAL HIGH (ref 70–99)
Potassium: 5.6 mmol/L — ABNORMAL HIGH (ref 3.5–5.1)
Sodium: 137 mmol/L (ref 135–145)

## 2019-09-22 LAB — CBC
HCT: 41.5 % (ref 39.0–52.0)
Hemoglobin: 11.5 g/dL — ABNORMAL LOW (ref 13.0–17.0)
MCH: 26 pg (ref 26.0–34.0)
MCHC: 27.7 g/dL — ABNORMAL LOW (ref 30.0–36.0)
MCV: 93.7 fL (ref 80.0–100.0)
Platelets: 217 10*3/uL (ref 150–400)
RBC: 4.43 MIL/uL (ref 4.22–5.81)
RDW: 16.9 % — ABNORMAL HIGH (ref 11.5–15.5)
WBC: 4.8 10*3/uL (ref 4.0–10.5)
nRBC: 0 % (ref 0.0–0.2)

## 2019-09-22 SURGERY — RIGHT HEART CATH
Anesthesia: LOCAL

## 2019-09-22 MED ORDER — HYDRALAZINE HCL 20 MG/ML IJ SOLN: 10.0000 mg | INTRAMUSCULAR | Status: DC | PRN

## 2019-09-22 MED ORDER — SODIUM CHLORIDE 0.9 % IV SOLN: 250.0000 mL | INTRAVENOUS | Status: DC | PRN

## 2019-09-22 MED ORDER — SODIUM CHLORIDE 0.9% FLUSH: 3.0000 mL | Freq: Two times a day (BID) | INTRAVENOUS | Status: DC

## 2019-09-22 MED ORDER — LIDOCAINE HCL (PF) 1 % IJ SOLN
INTRAMUSCULAR | Status: AC
  Filled 2019-09-22: qty 30

## 2019-09-22 MED ORDER — LIDOCAINE HCL (PF) 1 % IJ SOLN
INTRAMUSCULAR | Status: DC | PRN
  Administered 2019-09-22: 2 mL

## 2019-09-22 MED ORDER — ONDANSETRON HCL 4 MG/2ML IJ SOLN: 4.0000 mg | Freq: Four times a day (QID) | INTRAMUSCULAR | Status: DC | PRN

## 2019-09-22 MED ORDER — HEPARIN (PORCINE) IN NACL 1000-0.9 UT/500ML-% IV SOLN
INTRAVENOUS | Status: DC | PRN
  Administered 2019-09-22: 500 mL

## 2019-09-22 MED ORDER — ASPIRIN 81 MG PO CHEW: 81.0000 mg | CHEWABLE_TABLET | ORAL | Status: DC

## 2019-09-22 MED ORDER — HEPARIN (PORCINE) IN NACL 1000-0.9 UT/500ML-% IV SOLN
INTRAVENOUS | Status: AC
  Filled 2019-09-22: qty 500

## 2019-09-22 MED ORDER — SODIUM CHLORIDE 0.9 % IV SOLN: INTRAVENOUS | Status: DC

## 2019-09-22 MED ORDER — SODIUM CHLORIDE 0.9% FLUSH: 3.0000 mL | INTRAVENOUS | Status: DC | PRN

## 2019-09-22 MED ORDER — ACETAMINOPHEN 325 MG PO TABS: 650.0000 mg | ORAL_TABLET | ORAL | Status: DC | PRN

## 2019-09-22 MED ORDER — LABETALOL HCL 5 MG/ML IV SOLN: 10.0000 mg | INTRAVENOUS | Status: DC | PRN

## 2019-09-22 SURGICAL SUPPLY — 8 items
CATH BALLN WEDGE 5F 110CM (CATHETERS) ×1 IMPLANT
GUIDEWIRE .025 260CM (WIRE) ×1 IMPLANT
HOVERMATT SINGLE USE (MISCELLANEOUS) ×1 IMPLANT
PACK CARDIAC CATHETERIZATION (CUSTOM PROCEDURE TRAY) ×2 IMPLANT
SHEATH GLIDE SLENDER 4/5FR (SHEATH) ×1 IMPLANT
TRANSDUCER W/STOPCOCK (MISCELLANEOUS) ×2 IMPLANT
TUBING ART PRESS 72  MALE/FEM (TUBING) ×2
TUBING ART PRESS 72 MALE/FEM (TUBING) IMPLANT

## 2019-09-22 NOTE — Interval H&P Note (Signed)
History and Physical Interval Note:  09/22/2019 1:31 PM  [name]  has presented today for surgery, with the diagnosis of heart failure - PAH.  The various methods of treatment have been discussed with the patient and family. After consideration of risks, benefits and other options for treatment, the patient has consented to  Procedure(s): RIGHT HEART CATH (N/A) as a surgical intervention.  The patient's history has been reviewed, patient examined, no change in status, stable for surgery.  I have reviewed the patient's chart and labs.  Questions were answered to the patient's satisfaction.     [name]

## 2019-09-22 NOTE — Discharge Instructions (Signed)
Brachial Site Care  This sheet gives you information about how to care for yourself after your procedure. Your health care provider may also give you more specific instructions. If you have problems or questions, contact your health care provider. What can I expect after the procedure? After the procedure, it is common to have:  Bruising and tenderness at the catheter insertion area. Follow these instructions at home: Medicines  Take over-the-counter and prescription medicines only as told by your health care provider. Insertion site care  Follow instructions from your health care provider about how to take care of your insertion site. Make sure you: ? Wash your hands with soap and water before you change your bandage (dressing). If soap and water are not available, use hand sanitizer. ? Change your dressing as told by your health care provider. ? Leave stitches (sutures), skin glue, or adhesive strips in place. These skin closures may need to stay in place for 2 weeks or longer. If adhesive strip edges start to loosen and curl up, you may trim the loose edges. Do not remove adhesive strips completely unless your health care provider tells you to do that.  Check your insertion site every day for signs of infection. Check for: ? Redness, swelling, or pain. ? Fluid or blood. ? Pus or a bad smell. ? Warmth.  Do not take baths, swim, or use a hot tub until your health care provider approves.  You may shower 24-48 hours after the procedure, or as directed by your health care provider. ? Remove the dressing and gently wash the site with plain soap and water. ? Pat the area dry with a clean towel. ? Do not rub the site. That could cause bleeding.  Do not apply powder or lotion to the site. Activity   For 24 hours after the procedure, or as directed by your health care provider: ? Do not flex or bend the affected arm. ? Do not push or pull heavy objects with the affected arm. ? Do not  drive yourself home from the hospital or clinic. You may drive 24 hours after the procedure unless your health care provider tells you not to. ? Do not operate machinery or power tools.  Do not lift anything that is heavier than 10 lb (4.5 kg), or the limit that you are told, until your health care provider says that it is safe.  Ask your health care provider when it is okay to: ? Return to work or school. ? Resume usual physical activities or sports. ? Resume sexual activity. General instructions  If the catheter site starts to bleed, raise your arm and put firm pressure on the site. If the bleeding does not stop, get help right away. This is a medical emergency.  If you went home on the same day as your procedure, a responsible adult should be with you for the first 24 hours after you arrive home.  Keep all follow-up visits as told by your health care provider. This is important. Contact a health care provider if:  You have a fever.  You have redness, swelling, or yellow drainage around your insertion site. Get help right away if:  You have unusual pain at the radial site.  The catheter insertion area swells very fast.  The insertion area is bleeding, and the bleeding does not stop when you hold steady pressure on the area.  Your arm or hand becomes pale, cool, tingly, or numb. These symptoms may represent a serious problem   that is an emergency. Do not wait to see if the symptoms will go away. Get medical help right away. Call your local emergency services (911 in the [place] Do not drive yourself to the hospital. Summary  After the procedure, it is common to have bruising and tenderness at the site.  Follow instructions from your health care provider about how to take care of your radial site wound. Check the wound every day for signs of infection.  Do not lift anything that is heavier than 10 lb (4.5 kg), or the limit that you are told, until your health care provider says  that it is safe. This information is not intended to replace advice given to you by your health care provider. Make sure you discuss any questions you have with your health care provider. Document Revised: 05/06/2017 Document Reviewed: 05/06/2017 [employer] Patient Education  2020 [employer]   

## 2019-09-23 ENCOUNTER — Ambulatory Visit (INDEPENDENT_AMBULATORY_CARE_PROVIDER_SITE_OTHER): Payer: 59 | Admitting: Pulmonary Disease

## 2019-09-23 ENCOUNTER — Encounter (HOSPITAL_COMMUNITY): Payer: Self-pay | Admitting: Internal Medicine

## 2019-09-23 DIAGNOSIS — G4733 Obstructive sleep apnea (adult) (pediatric): Secondary | ICD-10-CM | POA: Diagnosis not present

## 2019-09-23 LAB — PULMONARY FUNCTION TEST
DL/VA % pred: 127 %
DL/VA: 5.55 ml/min/mmHg/L
DLCO cor % pred: 91 %
DLCO cor: 27.33 ml/min/mmHg
DLCO unc % pred: 82 %
DLCO unc: 24.6 ml/min/mmHg
FEF 25-75 Post: 1.44 L/sec
FEF 25-75 Pre: 1.28 L/sec
FEF2575-%Change-Post: 12 %
FEF2575-%Pred-Post: 41 %
FEF2575-%Pred-Pre: 36 %
FEV1-%Change-Post: 2 %
FEV1-%Pred-Post: 48 %
FEV1-%Pred-Pre: 47 %
FEV1-Post: 1.95 L
FEV1-Pre: 1.9 L
FEV1FVC-%Change-Post: 6 %
FEV1FVC-%Pred-Pre: 95 %
FEV6-%Change-Post: -3 %
FEV6-%Pred-Post: 49 %
FEV6-%Pred-Pre: 51 %
FEV6-Post: 2.47 L
FEV6-Pre: 2.56 L
FEV6FVC-%Change-Post: 0 %
FEV6FVC-%Pred-Post: 103 %
FEV6FVC-%Pred-Pre: 103 %
FVC-%Change-Post: -3 %
FVC-%Pred-Post: 47 %
FVC-%Pred-Pre: 49 %
FVC-Post: 2.47 L
FVC-Pre: 2.57 L
Post FEV1/FVC ratio: 79 %
Post FEV6/FVC ratio: 100 %
Pre FEV1/FVC ratio: 74 %
Pre FEV6/FVC Ratio: 100 %
RV % pred: 100 %
RV: 2.14 L
TLC % pred: 70 %
TLC: 5.08 L

## 2019-09-23 NOTE — Progress Notes (Signed)
PFT done today. 

## 2019-09-27 ENCOUNTER — Other Ambulatory Visit: Payer: Self-pay

## 2019-09-27 MED ORDER — PAIN MANAGEMENT IT PUMP REFILL: 1.0000 | Freq: Once | INTRATHECAL | 0 refills | Status: AC

## 2019-10-11 ENCOUNTER — Encounter (HOSPITAL_COMMUNITY): Payer: 59

## 2019-10-20 ENCOUNTER — Ambulatory Visit: Payer: No Typology Code available for payment source | Attending: Pain Medicine | Admitting: Pain Medicine

## 2019-10-20 ENCOUNTER — Other Ambulatory Visit: Payer: Self-pay

## 2019-10-20 ENCOUNTER — Encounter: Payer: Self-pay | Admitting: Pain Medicine

## 2019-10-20 VITALS — BP 151/69 | HR 96 | Temp 97.4°F | Resp 18 | Ht 71.0 in | Wt >= 6400 oz

## 2019-10-20 DIAGNOSIS — Z79899 Other long term (current) drug therapy: Secondary | ICD-10-CM | POA: Diagnosis not present

## 2019-10-20 DIAGNOSIS — M5137 Other intervertebral disc degeneration, lumbosacral region: Secondary | ICD-10-CM | POA: Insufficient documentation

## 2019-10-20 DIAGNOSIS — Z888 Allergy status to other drugs, medicaments and biological substances status: Secondary | ICD-10-CM | POA: Insufficient documentation

## 2019-10-20 DIAGNOSIS — M47896 Other spondylosis, lumbar region: Secondary | ICD-10-CM | POA: Insufficient documentation

## 2019-10-20 DIAGNOSIS — Z451 Encounter for adjustment and management of infusion pump: Secondary | ICD-10-CM

## 2019-10-20 DIAGNOSIS — Z01818 Encounter for other preprocedural examination: Secondary | ICD-10-CM | POA: Insufficient documentation

## 2019-10-20 DIAGNOSIS — M62838 Other muscle spasm: Secondary | ICD-10-CM | POA: Diagnosis not present

## 2019-10-20 DIAGNOSIS — M79604 Pain in right leg: Secondary | ICD-10-CM | POA: Diagnosis not present

## 2019-10-20 DIAGNOSIS — M792 Neuralgia and neuritis, unspecified: Secondary | ICD-10-CM

## 2019-10-20 DIAGNOSIS — G8929 Other chronic pain: Secondary | ICD-10-CM

## 2019-10-20 DIAGNOSIS — M47816 Spondylosis without myelopathy or radiculopathy, lumbar region: Secondary | ICD-10-CM

## 2019-10-20 DIAGNOSIS — M961 Postlaminectomy syndrome, not elsewhere classified: Secondary | ICD-10-CM

## 2019-10-20 DIAGNOSIS — G894 Chronic pain syndrome: Secondary | ICD-10-CM

## 2019-10-20 DIAGNOSIS — M79605 Pain in left leg: Secondary | ICD-10-CM | POA: Insufficient documentation

## 2019-10-20 DIAGNOSIS — M5134 Other intervertebral disc degeneration, thoracic region: Secondary | ICD-10-CM

## 2019-10-20 DIAGNOSIS — Z978 Presence of other specified devices: Secondary | ICD-10-CM

## 2019-10-20 MED ORDER — OXYCODONE HCL 10 MG PO TABS: 10.0000 mg | ORAL_TABLET | Freq: Four times a day (QID) | ORAL | 0 refills | Status: DC | PRN

## 2019-10-20 MED ORDER — PREGABALIN 150 MG PO CAPS: 150.0000 mg | ORAL_CAPSULE | Freq: Three times a day (TID) | ORAL | 5 refills | Status: DC

## 2019-10-20 MED ORDER — CYCLOBENZAPRINE HCL 10 MG PO TABS: 10.0000 mg | ORAL_TABLET | Freq: Every day | ORAL | 1 refills | Status: DC

## 2019-10-20 MED ORDER — TIZANIDINE HCL 4 MG PO TABS: 4.0000 mg | ORAL_TABLET | Freq: Three times a day (TID) | ORAL | 5 refills | Status: DC | PRN

## 2019-10-20 MED FILL — Medication: INTRATHECAL | Qty: 1 | Status: AC

## 2019-10-20 NOTE — Progress Notes (Signed)
Nursing Pain Medication Assessment:  Safety precautions to be maintained throughout the outpatient stay will include: orient to surroundings, keep bed in low position, maintain call bell within reach at all times, provide assistance with transfer out of bed and ambulation.  Medication Inspection Compliance: [name] did not comply with our request to bring his pills to be counted. He was reminded that bringing the medication bottles, even when empty, is a requirement.  Medication: None brought in. Pill/Patch Count: None available to be counted. Bottle Appearance: No container available. Did not bring bottle(s) to appointment. Filled Date: N/A Last Medication intake:  TodaySafety precautions to be maintained throughout the outpatient stay will include: orient to surroundings, keep bed in low position, maintain call bell within reach at all times, provide assistance with transfer out of bed and ambulation. Reminded to bring pills for count or empty pill bottle

## 2019-10-20 NOTE — Progress Notes (Signed)
PROVIDER NOTE: Information contained herein reflects review and annotations entered in association with encounter. Interpretation of such information and data should be left to medically-trained personnel. Information provided to patient can be located elsewhere in the medical record under "Patient Instructions". Document created using STT-dictation technology, any transcriptional errors that may result from process are unintentional.    Patient: [name]  Service Category: Procedure  Provider: Gaspar Cola, MD  DOB: May 20, 1965  DOS: 10/20/2019  Location: [facility]  MRN: [id number]  Setting: Ambulatory - outpatient  Referring Provider: Antony Contras, MD  Type: Established Patient  Specialty: Interventional Pain Management  PCP: Brian Contras, MD   Primary Reason for Visit: Interventional Pain Management Treatment. CC: Back Pain (low)  Procedure:          Intrathecal Drug Delivery System (IDDS):  Type: Reservoir Refill [phone]) No rate change Region: Abdominal Laterality: Left  Type of Pump: Medtronic Synchromed II Delivery Route: Intrathecal Type of Pain Treated: Neuropathic/Nociceptive Primary Medication Class: Opioid/opiate  Medication, Concentration, Infusion Program, & Delivery Rate: Please see scanned programming printout.   Indications: 1. Chronic pain syndrome   2. Failed back surgical syndrome   3. DDD (degenerative disc disease), lumbosacral   4. DDD (degenerative disc disease), thoracic   5. Lumbar facet syndrome (Bilateral) (R>L)   6. Chronic low back pain (Primary Area of Pain) (Bilateral) (R>L)   7. Chronic lower extremity pain (Secondary area of Pain) (Bilateral) (L>R)   8. Obesity, morbid (more than 100 lbs over ideal weight or BMI > 40) (HCC)   9. Presence of intrathecal pump   10. Pharmacologic therapy   11. Encounter for adjustment or management of infusion pump   12. Chronic musculoskeletal pain   13. Muscle spasticity   14. Neurogenic  pain    Pain Assessment: Self-Reported Pain Score: 5 /10             Reported level is compatible with observation.        Pharmacotherapy Assessment  Analgesic: Oxycodone IR 10 mg, 1 tab PO q 6 hrs (40 mg/day of oxycodone)+ 52.9 mcg/hr intrathecal PF-Fentanyl (1,268.8 mcg/day of Fentanyl) MME/day:79m/day(from oral medication)+ 126.96 mg/day (from intrathecal fentanyl) = 186.96MME/day(Aprox. 1.08MME/day/kg).   Monitoring: [place] PMP: PDMP not reviewed this encounter.       Pharmacotherapy: No side-effects or adverse reactions reported. Compliance: No problems identified. Effectiveness: Clinically acceptable. Plan: Refer to "POC".  UDS:  Summary  Date Value Ref Range Status  06/30/2018 FINAL  Final    Comment:    ==================================================================== TOXASSURE SELECT 13 (MW) ==================================================================== Test                             Result       Flag       Units Drug Present and Declared for Prescription Verification   Oxycodone                      955          EXPECTED   ng/mg creat   Oxymorphone                    2975         EXPECTED   ng/mg creat   Noroxycodone                   1684         EXPECTED  ng/mg creat   Noroxymorphone                 596          EXPECTED   ng/mg creat    Sources of oxycodone are scheduled prescription medications.    Oxymorphone, noroxycodone, and noroxymorphone are expected    metabolites of oxycodone. Oxymorphone is also available as a    scheduled prescription medication. Drug Present not Declared for Prescription Verification   Morphine                       141          UNEXPECTED ng/mg creat    Potential sources of morphine include administration of codeine    or morphine, use of heroin, or ingestion of poppy seeds.   Fentanyl                       6            UNEXPECTED ng/mg creat   Norfentanyl                    48           UNEXPECTED ng/mg creat     Source of fentanyl is a scheduled prescription medication,    including IV, patch, and transmucosal formulations. Norfentanyl    is an expected metabolite of fentanyl. ==================================================================== Test                      Result    Flag   Units      Ref Range   Creatinine              233              mg/dL      >=20 ==================================================================== Declared Medications:  The flagging and interpretation on this report are based on the  following declared medications.  Unexpected results may arise from  inaccuracies in the declared medications.  **Note: The testing scope of this panel includes these medications:  Oxycodone  **Note: The testing scope of this panel does not include following  reported medications:  Potassium  Promethazine  Vitamin B1 (Thiamine) ==================================================================== For clinical consultation, please call [phone]. ====================================================================     Intrathecal Pump Therapy Assessment  Manufacturer: Medtronic Synchromed Type: Programmable Volume: 40 mL reservoir MRI compatibility: Yes   Drug content:  Primary Medication Class:Opioid Primary Medication:PF-Fentanyl(2,000.0 mcg/ml) Secondary Medication:PF-Bupivacaine(20.'0mg'$ /mL) Other Medication:PF-Baclofen (200 mcg/mL)added to the pump on 05/26/2019   Programming:  Type: Simple continuous. See pump readout for details.   Changes:  Medication Change: None at this point Rate Change: No change in rate  Reported side-effects or adverse reactions: None reported  Effectiveness: Described as relatively effective, allowing for increase in activities of daily living (ADL) Clinically meaningful improvement in function (CMIF): Sustained CMIF goals met  Plan: Pump refill today  Pre-op Assessment:  [name] is a 54 y.o. (year old), male  patient, seen today for interventional treatment. He  has a past surgical history that includes Back surgery; morphine pump; Tonsillectomy; Joint replacement; Shoulder arthroscopy with subacromial decompression, rotator cuff repair and bicep tendon repair (Right, 08/18/2013); Shoulder arthroscopy with subacromial decompression and bicep tendon repair (Left, 10/06/2013); Laparoscopic gastric sleeve resection (N/A, 01/08/2015); and RIGHT HEART CATH (N/A, 09/22/2019). Mr. Sarin has a current medication list which includes the following prescription(s): acetaminophen, aspirin ec, calcium carbonate, vitamin  d3, escitalopram, esomeprazole, ibuprofen, lubiprostone, metolazone, NON FORMULARY, potassium chloride, promethazine, rosuvastatin, torsemide, cyclobenzaprine, [START ON 10/23/2019] oxycodone hcl, [START ON 11/22/2019] oxycodone hcl, [START ON 12/22/2019] oxycodone hcl, pregabalin, and tizanidine. His primarily concern today is the Back Pain (low)  Initial Vital Signs:  Pulse/HCG Rate: 96  Temp: (!) 97.4 F (36.3 C) Resp: 18 BP: (!) 151/69 SpO2: 100 %  BMI: Estimated body mass index is 55.79 kg/m as calculated from the following:   Height as of this encounter: _0  (1.803 m).   Weight as of this encounter: 400 lb (181.4 kg).  Risk Assessment: Allergies: Reviewed. He is allergic to tape.  Allergy Precautions: None required Coagulopathies: Reviewed. None identified.  Blood-thinner therapy: None at this time Active Infection(s): Reviewed. None identified. Mr. Bollen is afebrile  Site Confirmation: Mr. Daywalt was asked to confirm the procedure and laterality before marking the site Procedure checklist: Completed Consent: Before the procedure and under the influence of no sedative(s), amnesic(s), or anxiolytics, the patient was informed of the treatment options, risks and possible complications. To fulfill our ethical and legal obligations, as recommended by the American Medical Association's Code of  Ethics, I have informed the patient of my clinical impression; the nature and purpose of the treatment or procedure; the risks, benefits, and possible complications of the intervention; the alternatives, including doing nothing; the risk(s) and benefit(s) of the alternative treatment(s) or procedure(s); and the risk(s) and benefit(s) of doing nothing.  Mr. Giannini was provided with information about the general risks and possible complications associated with most interventional procedures. These include, but are not limited to: failure to achieve desired goals, infection, bleeding, organ or nerve damage, allergic reactions, paralysis, and/or death.  In addition, he was informed of those risks and possible complications associated to this particular procedure, which include, but are not limited to: damage to the implant; failure to decrease pain; local, systemic, or serious CNS infections, intraspinal abscess with possible cord compression and paralysis, or life-threatening such as meningitis; bleeding; organ damage; nerve injury or damage with subsequent sensory, motor, and/or autonomic system dysfunction, resulting in transient or permanent pain, numbness, and/or weakness of one or several areas of the body; allergic reactions, either minor or major life-threatening, such as anaphylactic or anaphylactoid reactions.  Furthermore, Mr. Stenseth was informed of those risks and complications associated with the medications. These include, but are not limited to: allergic reactions (i.e.: anaphylactic or anaphylactoid reactions); endorphine suppression; bradycardia and/or hypotension; water retention and/or peripheral vascular relaxation leading to lower extremity edema and possible stasis ulcers; respiratory depression and/or shortness of breath; decreased metabolic rate leading to weight gain; swelling or edema; medication-induced neural toxicity; particulate matter embolism and blood vessel occlusion with  resultant organ, and/or nervous system infarction; and/or intrathecal granuloma formation with possible spinal cord compression and permanent paralysis.  Before refilling the pump Mr. Dini was informed that some of the medications used in the devise may not be FDA approved for such use and therefore it constitutes an off-label use of the medications.  Finally, he was informed that Medicine is not an exact science; therefore, there is also the possibility of unforeseen or unpredictable risks and/or possible complications that may result in a catastrophic outcome. The patient indicated having understood very clearly. We have given the patient no guarantees and we have made no promises. Enough time was given to the patient to ask questions, all of which were answered to the patient's satisfaction. Mr. Heiland has indicated that he wanted to continue with  the procedure. Attestation: I, the ordering provider, attest that I have discussed with the patient the benefits, risks, side-effects, alternatives, likelihood of achieving goals, and potential problems during recovery for the procedure that I have provided informed consent. Date  Time: 10/20/2019 11:30 AM  Pre-Procedure Preparation:  Monitoring: As per clinic protocol. Respiration, ETCO2, SpO2, BP, heart rate and rhythm monitor placed and checked for adequate function Safety Precautions: Patient was assessed for positional comfort and pressure points before starting the procedure. Time-out: I initiated and conducted the "Time-out" before starting the procedure, as per protocol. The patient was asked to participate by confirming the accuracy of the "Time Out" information. Verification of the correct person, site, and procedure were performed and confirmed by me, the nursing staff, and the patient. "Time-out" conducted as per Joint Commission's Universal Protocol (UP.01.01.01). Time: 1210  Description of Procedure:          Position: Supine Target  Area: Central-port of intrathecal pump. Approach: Anterior, 90 degree angle approach. Area Prepped: Entire Area around the pump implant. DuraPrep (Iodine Povacrylex [0.7% available iodine] and Isopropyl Alcohol, 74% w/w) Safety Precautions: Aspiration looking for blood return was conducted prior to all injections. At no point did we inject any substances, as a needle was being advanced. No attempts were made at seeking any paresthesias. Safe injection practices and needle disposal techniques used. Medications properly checked for expiration dates. SDV (single dose vial) medications used. Description of the Procedure: Protocol guidelines were followed. Two nurses trained to do implant refills were present during the entire procedure. The refill medication was checked by both healthcare providers as well as the patient. The patient was included in the "Time-out" to verify the medication. The patient was placed in position. The pump was identified. The area was prepped in the usual manner. The sterile template was positioned over the pump, making sure the side-port location matched that of the pump. Both, the pump and the template were held for stability. The needle provided in the Medtronic Kit was then introduced thru the center of the template and into the central port. The pump content was aspirated and discarded volume documented. The new medication was slowly infused into the pump, thru the filter, making sure to avoid overpressure of the device. The needle was then removed and the area cleansed, making sure to leave some of the prepping solution back to take advantage of its long term bactericidal properties. The pump was interrogated and programmed to reflect the correct medication, volume, and dosage. The program was printed and taken to the physician for approval. Once checked and signed by the physician, a copy was provided to the patient and another scanned into the EMR. Vitals:   10/20/19 1130  10/20/19 1131  BP:  (!) 151/69  Pulse:  96  Resp:  18  Temp:  (!) 97.4 F (36.3 C)  SpO2:  100%  Weight: (!) 400 lb (181.4 kg)   Height: _0  (1.803 m)     Start Time: 1210 hrs. End Time: 1217 hrs. Materials & Medications: Medtronic Refill Kit Medication(s): Please see chart orders for details.  Imaging Guidance:          Type of Imaging Technique: None used Indication(s): N/A Exposure Time: No patient exposure Contrast: None used. Fluoroscopic Guidance: N/A Ultrasound Guidance: N/A Interpretation: N/A  Antibiotic Prophylaxis:   Anti-infectives (From admission, onward)   None     Indication(s): None identified  Post-operative Assessment:  Post-procedure Vital Signs:  Pulse/HCG Rate: 96  Temp: Marland Kitchen)  97.4 F (36.3 C) Resp: 18 BP: (!) 151/69 SpO2: 100 %  EBL: None  Complications: No immediate post-treatment complications observed by team, or reported by patient.  Note: The patient tolerated the entire procedure well. A repeat set of vitals were taken after the procedure and the patient was kept under observation following institutional policy, for this type of procedure. Post-procedural neurological assessment was performed, showing return to baseline, prior to discharge. The patient was provided with post-procedure discharge instructions, including a section on how to identify potential problems. Should any problems arise concerning this procedure, the patient was given instructions to immediately contact us, at any time, without hesitation. In any case, we plan to contact the patient by telephone for a follow-up status report regarding this interventional procedure.  Comments:  No additional relevant information.  Plan of Care  Orders:  Orders Placed This Encounter  Procedures  . PUMP REFILL    Maintain Protocol by having two(2) healthcare providers during procedure and programming.    Scheduling Instructions:     Please refill intrathecal pump today.    Order  Specific Question:   Where will this procedure be performed?    Answer:   [facility]  . PUMP REFILL    Whenever possible schedule on a procedure today.    Standing Status:   Future    Standing Expiration Date:   03/18/2020    Scheduling Instructions:     Please schedule intrathecal pump refill based on pump programming. Avoid schedule intervals of more than 120 days (4 months).    Order Specific Question:   Where will this procedure be performed?    Answer:   [facility]  . ToxASSURE Select 13 (MW), Urine    Volume: 30 ml(s). Minimum 3 ml of urine is needed. Document temperature of fresh sample. Indications: Long term (current) use of opiate analgesic (Y92.446)    Order Specific Question:   Release to patient    Answer:   Immediate  . Informed Consent Details: Physician/Practitioner Attestation; Transcribe to consent form and obtain patient signature    Provider Attestation: I, [place] Dossie Arbour, MD, (Pain Management Specialist), the physician/practitioner, attest that I have discussed with the patient the benefits, risks, side effects, alternatives, likelihood of achieving goals and potential problems during recovery for the procedure that I have provided informed consent.    Scheduling Instructions:     Procedure: Intrathecal Pump Refill     Attending Physician: Beatriz Chancellor A. Dossie Arbour, MD     Indications: Chronic Pain Syndrome (G89.4)     Transcribe to consent form and obtain patient signature.   Chronic Opioid Analgesic:  Oxycodone IR 10 mg, 1 tab PO q 6 hrs (40 mg/day of oxycodone)+ 52.9 mcg/hr intrathecal PF-Fentanyl (1,268.8 mcg/day of Fentanyl) MME/day:28m/day(from oral medication)+ 126.96 mg/day (from intrathecal fentanyl) = 186.96MME/day(Aprox. 1.08MME/day/kg).   Medications ordered for procedure: Meds ordered this encounter  Medications  . tiZANidine (ZANAFLEX) 4 MG tablet    Sig: Take 1 tablet (4 mg total) by mouth every 8 (eight) hours as needed  for muscle spasms.    Dispense:  90 tablet    Refill:  5    Fill one day early if pharmacy is closed on scheduled refill date. May substitute for generic if available.  . cyclobenzaprine (FLEXERIL) 10 MG tablet    Sig: Take 1 tablet (10 mg total) by mouth at bedtime.    Dispense:  90 tablet    Refill:  1    Fill  one day early if pharmacy is closed on scheduled refill date. May substitute for generic if available.  . Oxycodone HCl 10 MG TABS    Sig: Take 1 tablet (10 mg total) by mouth every 6 (six) hours as needed. Must last 30 days    Dispense:  120 tablet    Refill:  0    Chronic Pain: STOP Act (Not applicable) Fill 1 day early if closed on refill date. Do not fill until: 10/23/2019. To last until: 11/22/2019. Avoid benzodiazepines within 8 hours of opioids  . pregabalin (LYRICA) 150 MG capsule    Sig: Take 1 capsule (150 mg total) by mouth 3 (three) times daily.    Dispense:  90 capsule    Refill:  5    Fill one day early if pharmacy is closed on scheduled refill date. May substitute for generic if available.  . Oxycodone HCl 10 MG TABS    Sig: Take 1 tablet (10 mg total) by mouth every 6 (six) hours as needed. Must last 30 days    Dispense:  120 tablet    Refill:  0    Chronic Pain: STOP Act (Not applicable) Fill 1 day early if closed on refill date. Do not fill until: 11/22/2019. To last until: 12/22/2019. Avoid benzodiazepines within 8 hours of opioids  . Oxycodone HCl 10 MG TABS    Sig: Take 1 tablet (10 mg total) by mouth every 6 (six) hours as needed. Must last 30 days    Dispense:  120 tablet    Refill:  0    Chronic Pain: STOP Act (Not applicable) Fill 1 day early if closed on refill date. Do not fill until: 12/22/2019. To last until: 01/21/2020. Avoid benzodiazepines within 8 hours of opioids   Medications administered: Jeneen Rinks B. Simar had no medications administered during this visit.  See the medical record for exact dosing, route, and time of administration.  Follow-up plan:    Return for Pump Refill (Max:75mo.       Interventional management options: Considering: NOTE: NO RFA until BMI <35  Palliative/therapeuticintrathecal pump refill and maintenance Diagnostic left Genicular NB Possible left Genicular nerve RFA Possible bilateral lumbar facet RFA(NO Lumbar RFA until BMI <35) Diagnostic caudal ESI+epidurogram Possible Racz procedure Diagnostic bilateral IA shoulder joint injection Diagnostic bilateral suprascapular NB Possible bilateral suprascapular nerve RFA Diagnostic LESI vs TFESI   Palliative PRN treatment(s): Palliative/therapeuticintrathecal pump refill and maintenance Diagnostic bilateral lumbar facet block #2      Recent Visits Date Type Provider Dept  09/06/19 Procedure visit NMilinda Pointer MD [facility]  Showing recent visits within past 90 days and meeting all other requirements Today's Visits Date Type Provider Dept  10/20/19 Procedure visit NMilinda Pointer MD [facility]  Showing today's visits and meeting all other requirements Future Appointments Date Type Provider Dept  12/08/19 Appointment NMilinda Pointer MD [facility]  Showing future appointments within next 90 days and meeting all other requirements  Disposition: Discharge home  Discharge (Date  Time): 10/20/2019; 1222 hrs.   Primary Care Physician: SAntony Contras MD [facility] Note by: FGaspar Cola MD Date: 10/20/2019; Time: 12:33 PM  Disclaimer:  Medicine is not an eChief Strategy Officer The only guarantee in medicine is that nothing is guaranteed. It is important to note that the decision to proceed with this intervention was based on the information collected from the patient. The Data and conclusions were drawn from the patient's questionnaire, the interview, and  the physical examination. Because the information was provided in large part by the patient, it cannot be  guaranteed that it has not been purposely or unconsciously manipulated. Every effort has been made to obtain as much relevant data as possible for this evaluation. It is important to note that the conclusions that lead to this procedure are derived in large part from the available data. Always take into account that the treatment will also be dependent on availability of resources and existing treatment guidelines, considered by other Pain Management Practitioners as being common knowledge and practice, at the time of the intervention. For Medico-Legal purposes, it is also important to point out that variation in procedural techniques and pharmacological choices are the acceptable norm. The indications, contraindications, technique, and results of the above procedure should only be interpreted and judged by a Board-Certified Interventional Pain Specialist with extensive familiarity and expertise in the same exact procedure and technique.

## 2019-10-20 NOTE — Patient Instructions (Addendum)
____________________________________________________________________________________________  Drug Holidays (Slow)  What is a "Drug Holiday"? Drug Holiday: is the name given to the period of time during which a patient stops taking a medication(s) for the purpose of eliminating tolerance to the drug.  Benefits . Improved effectiveness of opioids. . Decreased opioid dose needed to achieve benefits. . Improved pain with lesser dose.  What is tolerance? Tolerance: is the progressive decreased in effectiveness of a drug due to its repetitive use. With repetitive use, the body gets use to the medication and as a consequence, it loses its effectiveness. This is a common problem seen with opioid pain medications. As a result, a larger dose of the drug is needed to achieve the same effect that used to be obtained with a smaller dose.  How long should a "Drug Holiday" last? You should stay off of the pain medicine for at least 14 consecutive days. (2 weeks)  Should I stop the medicine "cold turkey"? No. You should always coordinate with your Pain Specialist so that he/she can provide you with the correct medication dose to make the transition as smoothly as possible.  How do I stop the medicine? Slowly. You will be instructed to decrease the daily amount of pills that you take by one (1) pill every seven (7) days. This is called a "slow downward taper" of your dose. For example: if you normally take four (4) pills per day, you will be asked to drop this dose to three (3) pills per day for seven (7) days, then to two (2) pills per day for seven (7) days, then to one (1) per day for seven (7) days, and at the end of those last seven (7) days, this is when the "Drug Holiday" would start.   Will I have withdrawals? By doing a "slow downward taper" like this one, it is unlikely that you will experience any significant withdrawal symptoms. Typically, what triggers withdrawals is the sudden stop of a high  dose opioid therapy. Withdrawals can usually be avoided by slowly decreasing the dose over a prolonged period of time.  What are withdrawals? Withdrawals: refers to the wide range of symptoms that occur after stopping or dramatically reducing opiate drugs after heavy and prolonged use. Withdrawal symptoms do not occur to patients that use low dose opioids, or those who take the medication sporadically. Contrary to benzodiazepine (example: Valium, Xanax, etc.) or alcohol withdrawals ("Delirium Tremens"), opioid withdrawals are not lethal. Withdrawals are the physical manifestation of the body getting rid of the excess receptors.  Expected Symptoms Early symptoms of withdrawal may include: . Agitation . Anxiety . Muscle aches . Increased tearing . Insomnia . Runny nose . Sweating . Yawning  Late symptoms of withdrawal may include: . Abdominal cramping . Diarrhea . Dilated pupils . Goose bumps . Nausea . Vomiting  Will I experience withdrawals? Due to the slow nature of the taper, it is very unlikely that you will experience any.  What is a slow taper? Taper: refers to the gradual decrease in dose.  ___________________________________________________________________________________________    ____________________________________________________________________________________________  Medication Rules  Purpose: To inform patients, and their family members, of our rules and regulations.  Applies to: All patients receiving prescriptions (written or electronic).  Pharmacy of record: Pharmacy where electronic prescriptions will be sent. If written prescriptions are taken to a different pharmacy, please inform the nursing staff. The pharmacy listed in the electronic medical record should be the one where you would like electronic prescriptions to be sent.  Electronic   prescriptions: In compliance with the [place] Strengthen Opioid Misuse Prevention (STOP) Act of 2017 (Session  Law 2017-74/H243), effective April 14, 2018, all controlled substances must be electronically prescribed. Calling prescriptions to the pharmacy will cease to exist.  Prescription refills: Only during scheduled appointments. Applies to all prescriptions.  NOTE: The following applies primarily to controlled substances (Opioid* Pain Medications).   Type of encounter (visit): For patients receiving controlled substances, face-to-face visits are required. (Not an option or up to the patient.)  Patient's responsibilities: 1. Pain Pills: Bring all pain pills to every appointment (except for procedure appointments). 2. Pill Bottles: Bring pills in original pharmacy bottle. Always bring the newest bottle. Bring bottle, even if empty. 3. Medication refills: You are responsible for knowing and keeping track of what medications you take and those you need refilled. The day before your appointment: write a list of all prescriptions that need to be refilled. The day of the appointment: give the list to the admitting nurse. Prescriptions will be written only during appointments. No prescriptions will be written on procedure days. If you forget a medication: it will not be "Called in", "Faxed", or "electronically sent". You will need to get another appointment to get these prescribed. No early refills. Do not call asking to have your prescription filled early. 4. Prescription Accuracy: You are responsible for carefully inspecting your prescriptions before leaving our office. Have the discharge nurse carefully go over each prescription with you, before taking them home. Make sure that your name is accurately spelled, that your address is correct. Check the name and dose of your medication to make sure it is accurate. Check the number of pills, and the written instructions to make sure they are clear and accurate. Make sure that you are given enough medication to last until your next medication refill  appointment. 5. Taking Medication: Take medication as prescribed. When it comes to controlled substances, taking less pills or less frequently than prescribed is permitted and encouraged. Never take more pills than instructed. Never take medication more frequently than prescribed.  6. Inform other Doctors: Always inform, all of your healthcare providers, of all the medications you take. 7. Pain Medication from other Providers: You are not allowed to accept any additional pain medication from any other Doctor or Healthcare provider. There are two exceptions to this rule. (see below) In the event that you require additional pain medication, you are responsible for notifying us, as stated below. 8. Medication Agreement: You are responsible for carefully reading and following our Medication Agreement. This must be signed before receiving any prescriptions from our practice. Safely store a copy of your signed Agreement. Violations to the Agreement will result in no further prescriptions. (Additional copies of our Medication Agreement are available upon request.) 9. Laws, Rules, & Regulations: All patients are expected to follow all Federal and State Laws, Statutes, Rules, & Regulations. Ignorance of the Laws does not constitute a valid excuse.  10. Illegal drugs and Controlled Substances: The use of illegal substances (including, but not limited to marijuana and its derivatives) and/or the illegal use of any controlled substances is strictly prohibited. Violation of this rule may result in the immediate and permanent discontinuation of any and all prescriptions being written by our practice. The use of any illegal substances is prohibited. 11. Adopted CDC guidelines & recommendations: Target dosing levels will be at or below 60 MME/day. Use of benzodiazepines** is not recommended.  Exceptions: There are only two exceptions to the rule of not   receiving pain medications from other Healthcare  Providers. 1. Exception #1 (Emergencies): In the event of an emergency (i.e.: accident requiring emergency care), you are allowed to receive additional pain medication. However, you are responsible for: As soon as you are able, call our office [phone], at any time of the day or night, and leave a message stating your name, the date and nature of the emergency, and the name and dose of the medication prescribed. In the event that your call is answered by a member of our staff, make sure to document and save the date, time, and the name of the person that took your information.  2. Exception #2 (Planned Surgery): In the event that you are scheduled by another doctor or dentist to have any type of surgery or procedure, you are allowed (for a period no longer than 30 days), to receive additional pain medication, for the acute post-op pain. However, in this case, you are responsible for picking up a copy of our "Post-op Pain Management for Surgeons" handout, and giving it to your surgeon or dentist. This document is available at our office, and does not require an appointment to obtain it. Simply go to our office during business hours (Monday-Thursday from 8:00 AM to 4:00 PM) (Friday 8:00 AM to 12:00 Noon) or if you have a scheduled appointment with us, prior to your surgery, and ask for it by name. In addition, you will need to provide us with your name, name of your surgeon, type of surgery, and date of procedure or surgery.  *Opioid medications include: morphine, codeine, oxycodone, oxymorphone, hydrocodone, hydromorphone, meperidine, tramadol, tapentadol, buprenorphine, fentanyl, methadone. **Benzodiazepine medications include: diazepam (Valium), alprazolam (Xanax), clonazepam (Klonopine), lorazepam (Ativan), clorazepate (Tranxene), chlordiazepoxide (Librium), estazolam (Prosom), oxazepam (Serax), temazepam (Restoril), triazolam (Halcion) (Last updated:  06/11/2017) ____________________________________________________________________________________________   ____________________________________________________________________________________________  Medication Recommendations and Reminders  Applies to: All patients receiving prescriptions (written and/or electronic).  Medication Rules & Regulations: These rules and regulations exist for your safety and that of others. They are not flexible and neither are we. Dismissing or ignoring them will be considered "non-compliance" with medication therapy, resulting in complete and irreversible termination of such therapy. (See document titled "Medication Rules" for more details.) In all conscience, because of safety reasons, we cannot continue providing a therapy where the patient does not follow instructions.  Pharmacy of record:   Definition: This is the pharmacy where your electronic prescriptions will be sent.   We do not endorse any particular pharmacy.  You are not restricted in your choice of pharmacy.  The pharmacy listed in the electronic medical record should be the one where you want electronic prescriptions to be sent.  If you choose to change pharmacy, simply notify our nursing staff of your choice of new pharmacy.  Recommendations:  Keep all of your pain medications in a safe place, under lock and key, even if you live alone.   After you fill your prescription, take 1 week's worth of pills and put them away in a safe place. You should keep a separate, properly labeled bottle for this purpose. The remainder should be kept in the original bottle. Use this as your primary supply, until it runs out. Once it's gone, then you know that you have 1 week's worth of medicine, and it is time to come in for a prescription refill. If you do this correctly, it is unlikely that you will ever run out of medicine.  To make sure that the above recommendation works,   it is very important that you  make sure your medication refill appointments are scheduled at least 1 week before you run out of medicine. To do this in an effective manner, make sure that you do not leave the office without scheduling your next medication management appointment. Always ask the nursing staff to show you in your prescription , when your medication will be running out. Then arrange for the receptionist to get you a return appointment, at least 7 days before you run out of medicine. Do not wait until you have 1 or 2 pills left, to come in. This is very poor planning and does not take into consideration that we may need to cancel appointments due to bad weather, sickness, or emergencies affecting our staff.  "Partial Fill": If for any reason your pharmacy does not have enough pills/tablets to completely fill or refill your prescription, do not allow for a "partial fill". You will need a separate prescription to fill the remaining amount, which we will not provide. If the reason for the partial fill is your insurance, you will need to talk to the pharmacist about payment alternatives for the remaining tablets, but again, do not accept a partial fill.  Prescription refills and/or changes in medication(s):   Prescription refills, and/or changes in dose or medication, will be conducted only during scheduled medication management appointments. (Applies to both, written and electronic prescriptions.)  No refills on procedure days. No medication will be changed or started on procedure days. No changes, adjustments, and/or refills will be conducted on a procedure day. Doing so will interfere with the diagnostic portion of the procedure.  No phone refills. No medications will be "called into the pharmacy".  No Fax refills.  No weekend refills.  No Holliday refills.  No after hours refills.  Remember:  Business hours are:  Monday to Thursday 8:00 AM to 4:00 PM Provider's Schedule: Renuka Farfan, MD - Appointments  are:  Medication management: Monday and Wednesday 8:00 AM to 4:00 PM Procedure day: Tuesday and Thursday 7:30 AM to 4:00 PM Bilal Lateef, MD - Appointments are:  Medication management: Tuesday and Thursday 8:00 AM to 4:00 PM Procedure day: Monday and Wednesday 7:30 AM to 4:00 PM (Last update: 06/11/2017) ____________________________________________________________________________________________   ____________________________________________________________________________________________  CANNABIDIOL (AKA: CBD Oil or Pills)  Applies to: All patients receiving prescriptions of controlled substances (written and/or electronic).  General Information: Cannabidiol (CBD) was discovered in 1940. It is one of some 113 identified cannabinoids in cannabis (Marijuana) plants, accounting for up to 40% of the plant's extract. As of 2018, preliminary clinical research on cannabidiol included studies of anxiety, cognition, movement disorders, and pain.  Cannabidiol is consummed in multiple ways, including inhalation of cannabis smoke or vapor, as an aerosol spray into the cheek, and by mouth. It may be supplied as CBD oil containing CBD as the active ingredient (no added tetrahydrocannabinol (THC) or terpenes), a full-plant CBD-dominant hemp extract oil, capsules, dried cannabis, or as a liquid solution. CBD is thought not have the same psychoactivity as THC, and may affect the actions of THC. Studies suggest that CBD may interact with different biological targets, including cannabinoid receptors and other neurotransmitter receptors. As of 2018 the mechanism of action for its biological effects has not been determined.  In the [place], cannabidiol has a limited approval by the Food and Drug Administration (FDA) for treatment of only two types of epilepsy disorders. The side effects of long-term use of the drug include somnolence, decreased appetite, diarrhea,   fatigue, malaise, weakness, sleeping  problems, and others.  CBD remains a Schedule I drug prohibited for any use.  Legality: Some manufacturers ship CBD products nationally, an illegal action which the FDA has not enforced in 2018, with CBD remaining the subject of an FDA investigational new drug evaluation, and is not considered legal as a dietary supplement or food ingredient as of December 2018. Federal illegality has made it difficult historically to conduct research on CBD. CBD is openly sold in head shops and health food stores in some states where such sales have not been explicitly legalized.  Warning: Because it is not FDA approved for general use or treatment of pain, it is not required to undergo the same manufacturing controls as prescription drugs.  This means that the available cannabidiol (CBD) may be contaminated with THC.  If this is the case, it will trigger a positive urine drug screen (UDS) test for cannabinoids (Marijuana).  Because a positive UDS for illicit substances is a violation of our medication agreement, your opioid analgesics (pain medicine) may be permanently discontinued. (Last update: 07/02/2017) ____________________________________________________________________________________________    Opioid Overdose Opioids are drugs that are often used to treat pain. Opioids include illegal drugs, such as heroin, as well as prescription pain medicines, such as codeine, morphine, hydrocodone, oxycodone, and fentanyl. An opioid overdose happens when you take too much of an opioid. An overdose may be intentional or accidental and can happen with any type of opioid. The effects of an overdose can be mild, dangerous, or even deadly. Opioid overdose is a medical emergency. What are the causes? This condition may be caused by:  Taking too much of an opioid on purpose.  Taking too much of an opioid by accident.  Using two or more substances that contain opioids at the same time.  Taking an opioid with a  substance that affects your heart, breathing, or blood pressure. These include alcohol, tranquilizers, sleeping pills, illegal drugs, and some over-the-counter medicines. This condition may also happen due to an error made by:  A health care provider who prescribes a medicine.  The pharmacist who fills the prescription order. What increases the risk? This condition is more likely in:  Children. They may be attracted to colorful pills. Because of a child's small size, even a small amount of a drug can be dangerous.  Older people. They may be taking many different drugs. Older people may have difficulty reading labels or remembering when they last took their medicine. They may also be more sensitive to the effects of opioids.  People with chronic medical conditions, especially heart, liver, kidney, or neurological diseases.  People who take an opioid for a long period of time.  People who use: ? Illegal drugs. IV heroin is especially dangerous. ? Other substances, including alcohol, while using an opioid.  People who have: ? A history of drug or alcohol abuse. ? Certain mental health conditions. ? A history of previous drug overdoses.  People who take opioids that are not prescribed for them. What are the signs or symptoms? Symptoms of this condition depend on the type of opioid and the amount that was taken. Common symptoms include:  Sleepiness or difficulty waking from sleep.  Decrease in attention.  Confusion.  Slurred speech.  Slowed breathing and a slow pulse (bradycardia).  Nausea and vomiting.  Abnormally small pupils. Signs and symptoms that require emergency treatment include:  Cold, clammy, and pale skin.  Blue lips and fingernails.  Vomiting.  Gurgling sounds in  the throat.  A pulse that is very slow or difficult to detect.  Breathing that is very irregular, slow, noisy, or difficult to detect.  Limp body.  Inability to respond to speech or be  awakened from sleep (stupor).  Seizures. How is this diagnosed? This condition is diagnosed based on your symptoms and medical history. It is important to tell your health care provider:  About all of the opioids that you took.  When you took the opioids.  Whether you were drinking alcohol or using marijuana, cocaine, or other drugs. Your health care provider will do a physical exam. This exam may include:  Checking and monitoring your heart rate and rhythm, breathing rate, temperature, and blood pressure (vital signs).  Measuring oxygen levels in your blood.  Checking for abnormally small pupils. You may also have blood tests or urine tests. You may have X-rays if you are having severe breathing problems. How is this treated? This condition requires immediate medical treatment and hospitalization. Treatment is given in the hospital intensive care (ICU) setting. Supporting your vital signs and your breathing is the first step in treating an opioid overdose. Treatment may also include:  Giving salts and minerals (electrolytes) along with fluids through an IV.  Inserting a breathing tube (endotracheal tube) in your airway to help you breathe if you cannot breathe on your own or you are in danger of not being able to breathe on your own.  Giving oxygen through a small tube under your nose.  Passing a tube through your nose and into your stomach (nasogastric tube, or NG tube) to empty your stomach.  Giving medicines that: ? Increase your blood pressure. ? Relieve nausea and vomiting. ? Relieve abdominal pain and cramping. ? Reverse the effects of the opioid (naloxone).  Monitoring your heart and oxygen levels.  Ongoing counseling and mental health support if you intentionally overdosed or used an illegal drug. Follow these instructions at home:  Medicines  Take over-the-counter and prescription medicines only as told by your health care provider.  Always ask your health care  provider about possible side effects and interactions of any new medicine that you start taking.  Keep a list of all the medicines that you take, including over-the-counter medicines. Bring this list with you to all your medical visits. General instructions  Drink enough fluid to keep your urine pale yellow.  Keep all follow-up visits as told by your health care provider. This is important. How is this prevented?  Read the drug inserts that come with your opioid pain medicines.  Take medicines only as told by your health care provider. Do not take more medicine than you are told. Do not take medicines more frequently than you are told.  Do not drink alcohol or take sedatives when taking opioids.  Do not use illegal or recreational drugs, including cocaine, ecstasy, and marijuana.  Do not take opioid medicines that are not prescribed for you.  Store all medicines in safety containers that are out of the reach of children.  Get help if you are struggling with: ? Alcohol or drug use. ? Depression or another mental health problem. ? Thoughts of hurting yourself or another person.  Keep the phone number of your local poison control center near your phone or in your mobile phone. In the [place] the hotline of the [facility] is 617-415-9452.  If you were prescribed naloxone, make sure you understand how to take it. Contact a health care provider if  you:  Need help understanding how to take your pain medicines.  Feel your medicines are too strong.  Are concerned that your pain medicines are not working well for your pain.  Develop new symptoms or side effects when you are taking medicines. Get help right away if:  You or someone else is having symptoms of an opioid overdose. Get help even if you are not sure.  You have serious thoughts about hurting yourself or others.  You have: ? Chest pain. ? Difficulty breathing. ? A loss of consciousness. These  symptoms may represent a serious problem that is an emergency. Do not wait to see if the symptoms will go away. Get medical help right away. Call your local emergency services (911 in the [place] Do not drive yourself to the hospital. If you ever feel like you may hurt yourself or others, or have thoughts about taking your own life, get help right away. You can go to your nearest emergency department or call:  Your local emergency services (911 in the [place]  A suicide crisis helpline, such as the National Suicide Prevention Lifeline at 931-726-3261. This is open 24 hours a day. Summary  Opioids are drugs that are often used to treat pain. Opioids include illegal drugs, such as heroin, as well as prescription pain medicines.  An opioid overdose happens when you take too much of an opioid.  Overdoses can be intentional or accidental.  Opioid overdose is very dangerous. It is a life-threatening emergency.  If you or someone you know is experiencing an opioid overdose, get help right away. This information is not intended to replace advice given to you by your health care provider. Make sure you discuss any questions you have with your health care provider. Document Revised: 03/18/2018 Document Reviewed: 03/18/2018 Elsevier Patient Education  2020 [employer].

## 2019-10-21 ENCOUNTER — Telehealth: Payer: Self-pay

## 2019-10-21 NOTE — Telephone Encounter (Signed)
Post intrathecal pump refill.  Patient states he is doing well.

## 2019-10-28 LAB — TOXASSURE SELECT 13 (MW), URINE

## 2019-11-07 ENCOUNTER — Encounter: Payer: Self-pay | Admitting: Internal Medicine

## 2019-11-07 ENCOUNTER — Ambulatory Visit (INDEPENDENT_AMBULATORY_CARE_PROVIDER_SITE_OTHER): Payer: 59 | Admitting: Pulmonary Disease

## 2019-11-07 ENCOUNTER — Telehealth: Payer: Self-pay | Admitting: Internal Medicine

## 2019-11-07 ENCOUNTER — Other Ambulatory Visit: Payer: Self-pay

## 2019-11-07 ENCOUNTER — Ambulatory Visit (INDEPENDENT_AMBULATORY_CARE_PROVIDER_SITE_OTHER): Payer: 59 | Admitting: Internal Medicine

## 2019-11-07 ENCOUNTER — Encounter: Payer: Self-pay | Admitting: Pulmonary Disease

## 2019-11-07 VITALS — BP 134/75 | HR 95 | Temp 96.6°F | Ht 72.0 in | Wt >= 6400 oz

## 2019-11-07 VITALS — BP 134/88 | HR 99 | Temp 98.3°F | Ht 72.0 in | Wt >= 6400 oz

## 2019-11-07 DIAGNOSIS — Z6841 Body Mass Index (BMI) 40.0 and over, adult: Secondary | ICD-10-CM

## 2019-11-07 DIAGNOSIS — R0600 Dyspnea, unspecified: Secondary | ICD-10-CM | POA: Diagnosis not present

## 2019-11-07 DIAGNOSIS — R0609 Other forms of dyspnea: Secondary | ICD-10-CM

## 2019-11-07 DIAGNOSIS — I272 Pulmonary hypertension, unspecified: Secondary | ICD-10-CM

## 2019-11-07 DIAGNOSIS — G4733 Obstructive sleep apnea (adult) (pediatric): Secondary | ICD-10-CM

## 2019-11-07 DIAGNOSIS — I5032 Chronic diastolic (congestive) heart failure: Secondary | ICD-10-CM | POA: Diagnosis not present

## 2019-11-07 NOTE — Telephone Encounter (Signed)
Spoke with patient regarding appointment with Dr. Gala Romney (CHF clinic) scheduled Wednesday 11/09/19 at 2:40 pm---arrival time is 2:20 pm ---parking code is [id number].  Patient voiced his understanding.

## 2019-11-07 NOTE — Progress Notes (Signed)
Cardiology Office Note:    Date:  11/07/2019   ID:  [name], DOB [birth year], MRN [id number]  PCP:  [name], MD  Cardiologist:  Parke Poisson, MD  Electrophysiologist:  None   Referring MD: [name], MD   Chief Complaint: Heart failure  History of Present Illness:    [name] is a 54 y.o. male with a history of morbid obesity, chronic diastolic heart failure, OSA noncompliant with CPAP, significant chronic lower back pain with chronic opioids and followed by pain management.  He presents for follow-up of right heart catheterization and diuretic titration by heart failure clinic.  We discussed the results of his right heart catheterization performed by Dr. Gala Romney which demonstrate mild pulmonary arterial hypertension, with a normal cardiac output, no evidence of intracardiac shunting, and suspect WHO group 3 pulmonary hypertension.  Recommendation for weight loss and treatment of obstructive sleep apnea.  Patient has a follow-up appointment with Dr. Wynona Neat in pulmonary medicine today to discuss obstructive sleep apnea.  I emphasized the importance of treating sleep apnea and managing his heart failure.  He is tolerating torsemide which was prescribed and exchanged for his usual Bumex.  He feels that this is working reasonably well but still struggles to have adequate urine output to maintain euvolemia.  His daughter starts college at [employer] in August. Today is his birthday.  Past Medical History:  Diagnosis Date   Arthritis    Asthma    as child   Back pain    CHF (congestive heart failure) (HCC)    COPD (chronic obstructive pulmonary disease) (HCC)    GERD (gastroesophageal reflux disease)    Hernia    History of hiatal hernia    Hypogonadism in male    Neuromuscular disorder (HCC)    back injury with surgery   PONV (postoperative nausea and vomiting)    gets nausea   Shortness of breath dyspnea    Sleep apnea    Does not use  CPAP, new equipment 12/2014   Ulcer    Wears glasses     Past Surgical History:  Procedure Laterality Date   BACK SURGERY     JOINT REPLACEMENT     Left knee replacement   LAPAROSCOPIC GASTRIC SLEEVE RESECTION N/A 01/08/2015   Procedure: LAPAROSCOPIC GASTRIC SLEEVE RESECTION;  Surgeon: Luretha Murphy, MD;  Location: [facility];  Service: General;  Laterality: N/A;   morphine pump     RIGHT HEART CATH N/A 09/22/2019   Procedure: RIGHT HEART CATH;  Surgeon: Dolores Patty, MD;  Location: [facility];  Service: Cardiovascular;  Laterality: N/A;   SHOULDER ARTHROSCOPY WITH SUBACROMIAL DECOMPRESSION AND BICEP TENDON REPAIR Left 10/06/2013   Procedure: LEFT SHOULDER ARTHROSCOPY DEBRIDEMENT EXTENTSIVE,DISTAL CLAVICULECTOMY,DECOMPRESSION SUBACROMIAL PARTIAL ACROMIOPLASTY WITH ROTATOR CUFF REPAIR, and excision of CALCIUM DEPOSIT;  Surgeon: Loreta Ave, MD;  Location: [facility];  Service: Orthopedics;  Laterality: Left;   SHOULDER ARTHROSCOPY WITH SUBACROMIAL DECOMPRESSION, ROTATOR CUFF REPAIR AND BICEP TENDON REPAIR Right 08/18/2013   Procedure: RIGHT SHOULDER ARTHROSCOPY WITH SUBACROMIAL DECOMPRESSION/PARTIAL ACROMIOPLASTY WITH CORACOACROMIAL RELEASE/DISTAL CLAVICULECTOMY/ ROTATOR CUFF REPAIR/DEBRIDEMENT EXTENTSIVE;  Surgeon: Loreta Ave, MD;  Location: [facility];  Service: Orthopedics;  Laterality: Right;  ANESTHESIA: GENERAL, PRE/POST OP SCALENE   TONSILLECTOMY      Current Medications: Current Meds  Medication Sig   acetaminophen (TYLENOL) 500 MG tablet Take 1,000 mg by mouth every 6 (six) hours as needed for mild pain or moderate pain.  aspirin EC 81 MG tablet Take 81 mg by mouth daily.   calcium carbonate (CALCIUM 600) 600 MG TABS tablet Take 1 tablet (600 mg total) by mouth 2 (two) times daily with a meal.   Cholecalciferol (VITAMIN D3) 125 MCG (5000 UT) CAPS Take 1 capsule (5,000 Units total) by mouth daily with breakfast. Take  along with calcium and magnesium. (Patient taking differently: Take 5,000 Units by mouth daily with breakfast. Take along with calcium and magnesium.)   cyclobenzaprine (FLEXERIL) 10 MG tablet Take 1 tablet (10 mg total) by mouth at bedtime.   escitalopram (LEXAPRO) 20 MG tablet Take 10 mg by mouth daily.    esomeprazole (NEXIUM) 40 MG capsule Take 1 capsule (40 mg total) by mouth daily. (Patient taking differently: Take 40 mg by mouth every evening. )   ibuprofen (ADVIL) 200 MG tablet Take 400 mg by mouth every 6 (six) hours as needed for mild pain or moderate pain.   lubiprostone (AMITIZA) 24 MCG capsule Take 1 capsule (24 mcg total) by mouth 2 (two) times daily with a meal. Swallow the medication whole. Do not break or chew the medication. (Patient taking differently: Take 24 mcg by mouth daily as needed for constipation. Swallow the medication whole. Do not break or chew the medication.)   metolazone (ZAROXOLYN) 2.5 MG tablet Take 2.5 mg by mouth daily.    NON FORMULARY 1,395.2 mcg by Intrathecal route daily. IT pump Fentanyl 2,000.0 mcg/ml Bupivicaine 20.0 mg/ml Baclofen 200.0 mcg/ml 24 dose 1395.2 mcg/day 40 ml pump   Oxycodone HCl 10 MG TABS Take 1 tablet (10 mg total) by mouth every 6 (six) hours as needed. Must last 30 days   potassium chloride (MICRO-K) 10 MEQ CR capsule Take 2 capsules (20 mEq total) by mouth daily. (Patient taking differently: Take 10 mEq by mouth daily. )   pregabalin (LYRICA) 150 MG capsule Take 1 capsule (150 mg total) by mouth 3 (three) times daily.   promethazine (PHENERGAN) 25 MG tablet Take 25-50 mg by mouth every 6 (six) hours as needed for nausea or vomiting.    rosuvastatin (CRESTOR) 10 MG tablet Take 10 mg by mouth daily.   tiZANidine (ZANAFLEX) 4 MG tablet Take 1 tablet (4 mg total) by mouth every 8 (eight) hours as needed for muscle spasms.   torsemide (DEMADEX) 20 MG tablet Take 2 tablets (40 mg total) by mouth daily.     Allergies:    Tape   Social History   Socioeconomic History   Marital status: Married    Spouse name: Not on file   Number of children: Not on file   Years of education: Not on file   Highest education level: Not on file  Occupational History   Not on file  Tobacco Use   Smoking status: Never Smoker   Smokeless tobacco: Current User    Types: Snuff, Chew  Vaping Use   Vaping Use: Never used  Substance and Sexual Activity   Alcohol use: Yes    Comment: 2-7 beers per day, starting in Oct 2019.   Drug use: No   Sexual activity: Yes  Other Topics Concern   Not on file  Social History Narrative   Not on file   Social Determinants of Health   Financial Resource Strain:    Difficulty of Paying Living Expenses:   Food Insecurity:    Worried About Running Out of Food in the Last Year:    Ran Out of Food in the Last Year:  Transportation Needs:    Freight forwarder (Medical):    Lack of Transportation (Non-Medical):   Physical Activity:    Days of Exercise per Week:    Minutes of Exercise per Session:   Stress:    Feeling of Stress :   Social Connections:    Frequency of Communication with Friends and Family:    Frequency of Social Gatherings with Friends and Family:    Attends Religious Services:    Active Member of Clubs or Organizations:    Attends Banker Meetings:    Marital Status:      Family History: The patient's family history includes Diabetes in his mother; Pneumonia in his father.  ROS:   Please see the history of present illness.    All other systems reviewed and are negative.  EKGs/Labs/Other Studies Reviewed:    The following studies were reviewed today:  EKG:  n/a  Recent Labs: 06/11/2019: ALT 31 09/14/2019: B Natriuretic Peptide 72.6 09/20/2019: TSH 1.36 09/22/2019: BUN 9; Creatinine, Ser 0.74; Hemoglobin 15.3; Platelets 217; Potassium 3.8; Sodium 144  Recent Lipid Panel    Component Value Date/Time   TRIG 76  06/11/2018 0539    Physical Exam:    VS:  BP (!) 134/75    Pulse 95    Temp (!) 96.6 F (35.9 C)    Ht 6' (1.829 m)    Wt (!) 401 lb (181.9 kg)    SpO2 99%    BMI 54.39 kg/m     Wt Readings from Last 5 Encounters:  11/07/19 (!) 400 lb (181.4 kg)  11/07/19 (!) 401 lb (181.9 kg)  10/20/19 (!) 400 lb (181.4 kg)  09/22/19 (!) 406 lb (184.2 kg)  09/20/19 (!) 420 lb 9.6 oz (190.8 kg)     Constitutional: No acute distress Eyes: sclera non-icteric, normal conjunctiva and lids ENMT: normal dentition, moist mucous membranes Cardiovascular: regular rhythm, normal rate, no murmurs. S1 and S2 normal.  Respiratory: clear to auscultation bilaterally GI : normal bowel sounds, soft and nontender. No distention.  Obese abdomen MSK: extremities warm, well perfused.  Diffuse edema.  NEURO: grossly nonfocal exam, moves all extremities. PSYCH: alert and oriented x 3, normal mood and affect.   ASSESSMENT:    1. Chronic diastolic CHF (congestive heart failure) (HCC)   2. OSA complicated by mild polycythemia    3. Dyspnea on exertion   4. Morbid obesity with BMI of 50.0-59.9, adult (HCC)   5. Pulmonary HTN (HCC)    PLAN:    Chronic diastolic CHF (congestive heart failure) (HCC)  OSA complicated by mild polycythemia   Dyspnea on exertion  Morbid obesity with BMI of 50.0-59.9, adult (HCC)  Pulmonary HTN (HCC)   We discussed the importance of treating sleep apnea to best manage his heart failure symptoms.  He has follow-up with pulmonary medicine later today.  He can continue torsemide at this time and we discussed increasing the dose if needed temporarily for volume management.  It appears he was intended to have heart failure follow-up after his right heart cath but I do not see that that was completed, I will arrange for him to have a follow-up appointment with heart failure clinic for summary recommendations.  Total time of encounter: 25 minutes total time of encounter, including 15  minutes spent in face-to-face patient care on the date of this encounter. This time includes coordination of care and counseling regarding above mentioned problem list. Remainder of non-face-to-face time involved reviewing chart documents/testing relevant  to the patient encounter and documentation in the medical record. I have independently reviewed documentation from referring provider.   Weston Brass, MD [place]   [facility]    Medication Adjustments/Labs and Tests Ordered: Current medicines are reviewed at length with the patient today.  Concerns regarding medicines are outlined above.  No orders of the defined types were placed in this encounter.  No orders of the defined types were placed in this encounter.   Patient Instructions  Medication Instructions:  Your Physician recommend you continue on your current medication as directed.    *If you need a refill on your cardiac medications before your next appointment, please call your pharmacy*   Lab Work: None   Testing/Procedures: None   Follow-Up: At [facility], you and your health needs are our priority.  As part of our continuing mission to provide you with exceptional heart care, we have created designated Provider Care Teams.  These Care Teams include your primary Cardiologist (physician) and Advanced Practice Providers (APPs -  Physician Assistants and Nurse Practitioners) who all work together to provide you with the care you need, when you need it.  We recommend signing up for the patient portal called "MyChart".  Sign up information is provided on this After Visit Summary.  MyChart is used to connect with patients for Virtual Visits (Telemedicine).  Patients are able to view lab/test results, encounter notes, upcoming appointments, etc.  Non-urgent messages can be sent to your provider as well.   To learn more about what you can do with MyChart, go to [email or web address].    Your next appointment:   6  month(s)  The format for your next appointment:   In Person  Provider:   Weston Brass, MD  Do the following things EVERY DAY:  1) Weigh yourself EVERY morning after you go to the bathroom but before you eat or drink anything. Write this number down in a weight log/diary. If you gain 3 pounds overnight or 5 pounds in a week, call the office.  2) Take your medicines as prescribed. If you have concerns about your medications, please call us before you stop taking them.   3) Eat low salt foods--Limit salt (sodium) to 2000 mg per day. This will help prevent your body from holding onto fluid. Read food labels as many processed foods have a lot of sodium, especially canned goods and prepackaged meats. If you would like some assistance choosing low sodium foods, we would be happy to set you up with a nutritionist.  4) Stay as active as you can everyday. Staying active will give you more energy and make your muscles stronger. Start with 5 minutes at a time and work your way up to 30 minutes a day. Break up your activities--do some in the morning and some in the afternoon. Start with 3 days per week and work your way up to 5 days as you can.  If you have chest pain, feel short of breath, dizzy, or lightheaded, STOP. If you don't feel better after a short rest, call 911. If you do feel better, call the office to let us know you have symptoms with exercise.  5) Limit all fluids for the day to less than 2 liters. Fluid includes all drinks, coffee, juice, ice chips, soup, jello, and all other liquids.

## 2019-11-07 NOTE — Patient Instructions (Addendum)
We will follow up on the overnight oximetry study that is scheduled  We will schedule you for a home sleep study -This is the best way to treat sleep disordered breathing  Continue with weight loss efforts  Call with significant concerns  We will plan to see you following initiation of CPAP therapy if indicated

## 2019-11-07 NOTE — Progress Notes (Signed)
[name]    [id number]    [birth year]  Primary Care Physician:Swayne, Onalee Hua, MD  Referring Physician: [name], MD [phone] [place],  [place] [address]  Chief complaint:   Patient was diagnosed with obstructive sleep apnea about 7 years ago In for shortness of breath and sleep apnea  HPI:  Diagnosed 7 years ago Used CPAP for few years Has not been using it for the last up to a year He states not is not reluctance to using it if it is needed He currently uses oxygen supplementation We are waiting on an overnight oximetry to confirm adequate supplementation  He does snore constantly, daytime sleepiness  Overall health is stable Trying to be active Weight is still stable  Over the last year has gained about 130 pounds of water weight Following up with cardiology He has a cardiac catheterization scheduled for this Thursday He has been diuresed to get rid of water weight He has chronic back injury which limits his activity does require a walker Has had many falls recently  History of diabetes, hypertension, heart failure  Has no underlying lung disease known to him Asthmatic bronchitis in the past, not on any inhalers  Does not smoke Drinks up to 8 beers a day  Outpatient Encounter Medications as of 11/07/2019  Medication Sig  . acetaminophen (TYLENOL) 500 MG tablet Take 1,000 mg by mouth every 6 (six) hours as needed for mild pain or moderate pain.   Marland Kitchen aspirin EC 81 MG tablet Take 81 mg by mouth daily.  . calcium carbonate (CALCIUM 600) 600 MG TABS tablet Take 1 tablet (600 mg total) by mouth 2 (two) times daily with a meal.  . Cholecalciferol (VITAMIN D3) 125 MCG (5000 UT) CAPS Take 1 capsule (5,000 Units total) by mouth daily with breakfast. Take along with calcium and magnesium. (Patient taking differently: Take 5,000 Units by mouth daily with breakfast. Take along with calcium and magnesium.)  . cyclobenzaprine (FLEXERIL) 10 MG  tablet Take 1 tablet (10 mg total) by mouth at bedtime.  Marland Kitchen escitalopram (LEXAPRO) 20 MG tablet Take 10 mg by mouth daily.   Marland Kitchen esomeprazole (NEXIUM) 40 MG capsule Take 1 capsule (40 mg total) by mouth daily. (Patient taking differently: Take 40 mg by mouth every evening. )  . ibuprofen (ADVIL) 200 MG tablet Take 400 mg by mouth every 6 (six) hours as needed for mild pain or moderate pain.  Marland Kitchen lubiprostone (AMITIZA) 24 MCG capsule Take 1 capsule (24 mcg total) by mouth 2 (two) times daily with a meal. Swallow the medication whole. Do not break or chew the medication. (Patient taking differently: Take 24 mcg by mouth daily as needed for constipation. Swallow the medication whole. Do not break or chew the medication.)  . metolazone (ZAROXOLYN) 2.5 MG tablet Take 2.5 mg by mouth daily.   . NON FORMULARY 1,395.2 mcg by Intrathecal route daily. IT pump Fentanyl 2,000.0 mcg/ml Bupivicaine 20.0 mg/ml Baclofen 200.0 mcg/ml 24 dose 1395.2 mcg/day 40 ml pump  . Oxycodone HCl 10 MG TABS Take 1 tablet (10 mg total) by mouth every 6 (six) hours as needed. Must last 30 days  . potassium chloride (MICRO-K) 10 MEQ CR capsule Take 2 capsules (20 mEq total) by mouth daily. (Patient taking differently: Take 10 mEq by mouth daily. )  . pregabalin (LYRICA) 150 MG capsule Take 1 capsule (150 mg total) by mouth 3 (three) times daily.  Marland Kitchen  promethazine (PHENERGAN) 25 MG tablet Take 25-50 mg by mouth every 6 (six) hours as needed for nausea or vomiting.   . rosuvastatin (CRESTOR) 10 MG tablet Take 10 mg by mouth daily.  Marland Kitchen. tiZANidine (ZANAFLEX) 4 MG tablet Take 1 tablet (4 mg total) by mouth every 8 (eight) hours as needed for muscle spasms.  Marland Kitchen. torsemide (DEMADEX) 20 MG tablet Take 2 tablets (40 mg total) by mouth daily.   No facility-administered encounter medications on file as of 11/07/2019.    Allergies as of 11/07/2019 - Review Complete 11/07/2019  Allergen Reaction Noted  . Tape Other (See Comments) 08/16/2013     Past Medical History:  Diagnosis Date  . Arthritis   . Asthma    as child  . Back pain   . CHF (congestive heart failure) (HCC)   . COPD (chronic obstructive pulmonary disease) (HCC)   . GERD (gastroesophageal reflux disease)   . Hernia   . History of hiatal hernia   . Hypogonadism in male   . Neuromuscular disorder (HCC)    back injury with surgery  . PONV (postoperative nausea and vomiting)    gets nausea  . Shortness of breath dyspnea   . Sleep apnea    Does not use CPAP, new equipment 12/2014  . Ulcer   . Wears glasses     Past Surgical History:  Procedure Laterality Date  . BACK SURGERY    . JOINT REPLACEMENT     Left knee replacement  . LAPAROSCOPIC GASTRIC SLEEVE RESECTION N/A 01/08/2015   Procedure: LAPAROSCOPIC GASTRIC SLEEVE RESECTION;  Surgeon: Luretha MurphyMatthew Martin, MD;  Location: [facility];  Service: General;  Laterality: N/A;  . morphine pump    . RIGHT HEART CATH N/A 09/22/2019   Procedure: RIGHT HEART CATH;  Surgeon: Dolores PattyBensimhon, Daniel R, MD;  [facility];  Service: Cardiovascular;  Laterality: N/A;  . SHOULDER ARTHROSCOPY WITH SUBACROMIAL DECOMPRESSION AND BICEP TENDON REPAIR Left 10/06/2013   Procedure: LEFT SHOULDER ARTHROSCOPY DEBRIDEMENT EXTENTSIVE,DISTAL CLAVICULECTOMY,DECOMPRESSION SUBACROMIAL PARTIAL ACROMIOPLASTY WITH ROTATOR CUFF REPAIR, and excision of CALCIUM DEPOSIT;  Surgeon: Loreta Aveaniel F Murphy, MD;  Location: [facility];  Service: Orthopedics;  Laterality: Left;  . SHOULDER ARTHROSCOPY WITH SUBACROMIAL DECOMPRESSION, ROTATOR CUFF REPAIR AND BICEP TENDON REPAIR Right 08/18/2013   Procedure: RIGHT SHOULDER ARTHROSCOPY WITH SUBACROMIAL DECOMPRESSION/PARTIAL ACROMIOPLASTY WITH CORACOACROMIAL RELEASE/DISTAL CLAVICULECTOMY/ ROTATOR CUFF REPAIR/DEBRIDEMENT EXTENTSIVE;  Surgeon: Loreta Aveaniel F Murphy, MD;  Location: [facility];  Service: Orthopedics;  Laterality: Right;  ANESTHESIA: GENERAL, PRE/POST OP SCALENE  . TONSILLECTOMY       Family History  Problem Relation Age of Onset  . Diabetes Mother   . Pneumonia Father     Social History   Socioeconomic History  . Marital status: Married    Spouse name: Not on file  . Number of children: Not on file  . Years of education: Not on file  . Highest education level: Not on file  Occupational History  . Not on file  Tobacco Use  . Smoking status: Never Smoker  . Smokeless tobacco: Current User    Types: Snuff, Chew  Vaping Use  . Vaping Use: Never used  Substance and Sexual Activity  . Alcohol use: Yes    Comment: 2-7 beers per day, starting in Oct 2019.  . Drug use: No  . Sexual activity: Yes  Other Topics Concern  . Not on file  Social History Narrative  . Not on file   Social Determinants of Health  Financial Resource Strain:   . Difficulty of Paying Living Expenses:   Food Insecurity:   . Worried About Programme researcher, broadcasting/film/video in the Last Year:   . Barista in the Last Year:   Transportation Needs:   . Freight forwarder (Medical):   Marland Kitchen Lack of Transportation (Non-Medical):   Physical Activity:   . Days of Exercise per Week:   . Minutes of Exercise per Session:   Stress:   . Feeling of Stress :   Social Connections:   . Frequency of Communication with Friends and Family:   . Frequency of Social Gatherings with Friends and Family:   . Attends Religious Services:   . Active Member of Clubs or Organizations:   . Attends Banker Meetings:   Marland Kitchen Marital Status:   Intimate Partner Violence:   . Fear of Current or Ex-Partner:   . Emotionally Abused:   Marland Kitchen Physically Abused:   . Sexually Abused:     Review of Systems  Constitutional: Positive for fatigue.  Respiratory: Positive for apnea.   Cardiovascular: Positive for leg swelling.  Musculoskeletal: Positive for arthralgias and back pain.  Psychiatric/Behavioral: Positive for sleep disturbance.    Vitals:   11/07/19 1344  BP: (!) 134/88  Pulse: 99  Temp: 98.3 F  (36.8 C)  SpO2: 90%     Physical Exam Constitutional:      Comments: Morbid obesity  HENT:     Head: Normocephalic.  Eyes:     General:        Right eye: No discharge.        Left eye: No discharge.     Pupils: Pupils are equal, round, and reactive to light.  Neck:     Thyroid: No thyromegaly.     Trachea: No tracheal deviation.  Cardiovascular:     Rate and Rhythm: Normal rate and regular rhythm.     Pulses: Normal pulses.     Heart sounds: No murmur heard.  No friction rub.  Pulmonary:     Effort: Pulmonary effort is normal. No respiratory distress.     Breath sounds: Normal breath sounds. No wheezing or rales.  Chest:     Chest wall: No tenderness.  Musculoskeletal:        General: Swelling present.  Neurological:     Mental Status: He is alert.    Data Reviewed: Last echocardiogram February 2021 was within normal limits with an ejection fraction of 60 to 65%, right ventricular systolic function is normal with normal right ventricular size  Assessment:  History of obstructive sleep apnea-intolerant to CPAP  Hypoxemic respiratory failure with desaturations mostly at night -Currently is on oxygen supplementation  Morbid obesity which may be associated with a restrictive physiology  Plan/Recommendations: We will obtain an overnight oximetry on his current oxygen segmentation   Obtain PFT -Current PFT is consistent with restrictive physiology based on his weight  Continue oxygen supplementation at present  thyroid function test -Within normal limits  I did discuss the fact that the CPAP or BiPAP would be the most effective treatment for his obstructive sleep apnea -He is agreeable with undergoing a home sleep study  Obtaining an oximetry will at least ascertain that he is getting adequate supplementation even though oxygen supplementation will not treat his sleep disordered breathing  We will follow-up in about 3 months  Encouraged to call with any  significant concerns  Virl Diamond MD [facility] 11/07/2019, 2:06 PM  CC: [name], MD

## 2019-11-07 NOTE — Patient Instructions (Addendum)
Medication Instructions:  Your Physician recommend you continue on your current medication as directed.    *If you need a refill on your cardiac medications before your next appointment, please call your pharmacy*   Lab Work: None   Testing/Procedures: None   Follow-Up: At [facility], you and your health needs are our priority.  As part of our continuing mission to provide you with exceptional heart care, we have created designated Provider Care Teams.  These Care Teams include your primary Cardiologist (physician) and Advanced Practice Providers (APPs -  Physician Assistants and Nurse Practitioners) who all work together to provide you with the care you need, when you need it.  We recommend signing up for the patient portal called "MyChart".  Sign up information is provided on this After Visit Summary.  MyChart is used to connect with patients for Virtual Visits (Telemedicine).  Patients are able to view lab/test results, encounter notes, upcoming appointments, etc.  Non-urgent messages can be sent to your provider as well.   To learn more about what you can do with MyChart, go to [email or web address].    Your next appointment:   6 month(s)  The format for your next appointment:   In Person  Provider:   Weston Brass, MD  Do the following things EVERY DAY:  1) Weigh yourself EVERY morning after you go to the bathroom but before you eat or drink anything. Write this number down in a weight log/diary. If you gain 3 pounds overnight or 5 pounds in a week, call the office.  2) Take your medicines as prescribed. If you have concerns about your medications, please call us before you stop taking them.   3) Eat low salt foods--Limit salt (sodium) to 2000 mg per day. This will help prevent your body from holding onto fluid. Read food labels as many processed foods have a lot of sodium, especially canned goods and prepackaged meats. If you would like some assistance choosing low  sodium foods, we would be happy to set you up with a nutritionist.  4) Stay as active as you can everyday. Staying active will give you more energy and make your muscles stronger. Start with 5 minutes at a time and work your way up to 30 minutes a day. Break up your activities--do some in the morning and some in the afternoon. Start with 3 days per week and work your way up to 5 days as you can.  If you have chest pain, feel short of breath, dizzy, or lightheaded, STOP. If you don't feel better after a short rest, call 911. If you do feel better, call the office to let us know you have symptoms with exercise.  5) Limit all fluids for the day to less than 2 liters. Fluid includes all drinks, coffee, juice, ice chips, soup, jello, and all other liquids.

## 2019-11-07 NOTE — Telephone Encounter (Signed)
Called to give patient appointment information for CHF clinic---scheduled Wednesday 11/09/19 at 2:40 pm voice mail full

## 2019-11-09 ENCOUNTER — Inpatient Hospital Stay (HOSPITAL_BASED_OUTPATIENT_CLINIC_OR_DEPARTMENT_OTHER)
Admission: RE | Admit: 2019-11-09 | Discharge: 2019-11-09 | Disposition: A | Discharge status: Home / Self Care | Payer: 59 | Source: Ambulatory Visit | Attending: Internal Medicine | Admitting: Internal Medicine

## 2019-11-09 DIAGNOSIS — I272 Pulmonary hypertension, unspecified: Secondary | ICD-10-CM

## 2019-11-09 NOTE — Progress Notes (Signed)
ADVANCED HF CLINIC PROGRESS NOTE    Patient did not show for appointment. Note left for templating purposes.      Primary Care: [name], MD Primary Cardiologist: Dr. Jacques Navy  Reason for Visit: F/u for Chronic Diastolic Heart Failure   HPI:  54 y/o male w/ obesity, chronic diastolic heart failure, OSA noncompliant w/ CPAP and chronic LBP on chronic opiods. Recently referred by Dr. Jacques Navy for further management of his chronic diastolic HF. Management of volume status has been challenging. Outpatient diuretic regimen = Bumex 2 mg bid. Has normal renal function. Echo 2/21 showed normal LVEF 60-65%, no LVH. RV normal. No significant valvular disease. RVSP estimate not outlined in study report. His most recent hospitalization was 06/2019 for a/c CHF, requiring treatment w/ IV diuretics. His discharge wt was 394 lb.   He was seen for initial consultation in [facility] on 09/05/19. Was SOB w/ activity. Improvement w/ rest. Sedentary at baseline. Functional limitation multifactorial from morbid obesity, CHF, deconditioning and chronic back pain.   Since being discharged from hospital, he has had steady wt gain, which he believes is all fluid. His clinic wt on 5/24 was up to 434 lb. Reported daily compliance w/ Bumex but poor urinary response. Watching sodium intake and tries to fluid restrict. Sleeps w/ head of bed elevated due to orthopnea and chronic back pain. BP is well controlled.   I referred to remote health for IV Lasix, with plans to set up for RHC once better diuresed. He was started on 80 IV lasix BID. Initially had decent response, then UOP slowed. Metolazone was ordered as adjunct to IV Lasix w/ improvement in diuresis. BMP was followed daily and SCr and K remained stable.  He presents back for f/u. His wt is overall down 32 lb. He feels better. His wife has noticed improvement. He seems to be sleeping better. Less orthopnea/PND. BP is stable.   He has transitioned back  to Bumex but notes poor urinary response.   RHC 09/22/19   RA = 9 RV = 50/8 PA = 50/22 (34) PCW = 17 Fick cardiac output/index = 10.0/3.5 PVR = 2.1 WU FA sat = 93% PA sat = 68%, 70% SVC sat = 73%  Assessment:  1. Mild PAH 2. Normal cardiac output 3. No evidence of intracardiac shunting  Plan/Discussion:  Suspect WHO Group III PAH. Recommend weight loss and treatment of OSA.    Social: Disabled after hurting his back. Lives w/ wife (she just completed treatment for cancer). They have 2 children. Daughter is a Holiday representative in high school and plans to attend [employer] in the fall. Their son has cerebral palsy     2D Echo 2/21 1. Left ventricular ejection fraction, by estimation, is 60 to 65%. The left ventricle has normal function. The left ventricle has no regional wall motion abnormalities. Left ventricular diastolic parameters were normal. 2. Right ventricular systolic function is normal. The right ventricular size is normal. 3. The mitral valve is normal in structure and function. No evidence of mitral valve regurgitation. No evidence of mitral stenosis. 4. The aortic valve is normal in structure and function. Aortic valve regurgitation is not visualized. No aortic stenosis is present. 5. The inferior vena cava is normal in size with greater than 50% respiratory variability, suggesting right atrial pressure of 3 mmHg.   Past Medical History:  Diagnosis Date  . Arthritis   . Asthma    as child  . Back pain   .  CHF (congestive heart failure) (HCC)   . COPD (chronic obstructive pulmonary disease) (HCC)   . GERD (gastroesophageal reflux disease)   . Hernia   . History of hiatal hernia   . Hypogonadism in male   . Neuromuscular disorder (HCC)    back injury with surgery  . PONV (postoperative nausea and vomiting)    gets nausea  . Shortness of breath dyspnea   . Sleep apnea    Does not use CPAP, new equipment 12/2014  . Ulcer   . Wears glasses     Current  Outpatient Medications  Medication Sig Dispense Refill  . acetaminophen (TYLENOL) 500 MG tablet Take 1,000 mg by mouth every 6 (six) hours as needed for mild pain or moderate pain.     Marland Kitchen aspirin EC 81 MG tablet Take 81 mg by mouth daily.    . calcium carbonate (CALCIUM 600) 600 MG TABS tablet Take 1 tablet (600 mg total) by mouth 2 (two) times daily with a meal. 180 tablet 3  . Cholecalciferol (VITAMIN D3) 125 MCG (5000 UT) CAPS Take 1 capsule (5,000 Units total) by mouth daily with breakfast. Take along with calcium and magnesium. (Patient taking differently: Take 5,000 Units by mouth daily with breakfast. Take along with calcium and magnesium.) 90 capsule 3  . cyclobenzaprine (FLEXERIL) 10 MG tablet Take 1 tablet (10 mg total) by mouth at bedtime. 90 tablet 1  . escitalopram (LEXAPRO) 20 MG tablet Take 10 mg by mouth daily.     Marland Kitchen esomeprazole (NEXIUM) 40 MG capsule Take 1 capsule (40 mg total) by mouth daily. (Patient taking differently: Take 40 mg by mouth every evening. ) 90 capsule 3  . ibuprofen (ADVIL) 200 MG tablet Take 400 mg by mouth every 6 (six) hours as needed for mild pain or moderate pain.    Marland Kitchen lubiprostone (AMITIZA) 24 MCG capsule Take 1 capsule (24 mcg total) by mouth 2 (two) times daily with a meal. Swallow the medication whole. Do not break or chew the medication. (Patient taking differently: Take 24 mcg by mouth daily as needed for constipation. Swallow the medication whole. Do not break or chew the medication.) 60 capsule 5  . metolazone (ZAROXOLYN) 2.5 MG tablet Take 2.5 mg by mouth daily.     . NON FORMULARY 1,395.2 mcg by Intrathecal route daily. IT pump Fentanyl 2,000.0 mcg/ml Bupivicaine 20.0 mg/ml Baclofen 200.0 mcg/ml 24 dose 1395.2 mcg/day 40 ml pump    . Oxycodone HCl 10 MG TABS Take 1 tablet (10 mg total) by mouth every 6 (six) hours as needed. Must last 30 days 120 tablet 0  . potassium chloride (MICRO-K) 10 MEQ CR capsule Take 2 capsules (20 mEq total) by mouth  daily. (Patient taking differently: Take 10 mEq by mouth daily. ) 180 capsule 3  . pregabalin (LYRICA) 150 MG capsule Take 1 capsule (150 mg total) by mouth 3 (three) times daily. 90 capsule 5  . promethazine (PHENERGAN) 25 MG tablet Take 25-50 mg by mouth every 6 (six) hours as needed for nausea or vomiting.     . rosuvastatin (CRESTOR) 10 MG tablet Take 10 mg by mouth daily.    Marland Kitchen tiZANidine (ZANAFLEX) 4 MG tablet Take 1 tablet (4 mg total) by mouth every 8 (eight) hours as needed for muscle spasms. 90 tablet 5  . torsemide (DEMADEX) 20 MG tablet Take 2 tablets (40 mg total) by mouth daily. 180 tablet 3   No current facility-administered medications for this encounter.  Allergies  Allergen Reactions  . Tape Other (See Comments)    Blisters - tolerates paper tape well      Social History   Socioeconomic History  . Marital status: Married    Spouse name: Not on file  . Number of children: Not on file  . Years of education: Not on file  . Highest education level: Not on file  Occupational History  . Not on file  Tobacco Use  . Smoking status: Never Smoker  . Smokeless tobacco: Current User    Types: Snuff, Chew  Vaping Use  . Vaping Use: Never used  Substance and Sexual Activity  . Alcohol use: Yes    Comment: 2-7 beers per day, starting in Oct 2019.  . Drug use: No  . Sexual activity: Yes  Other Topics Concern  . Not on file  Social History Narrative  . Not on file   Social Determinants of Health   Financial Resource Strain:   . Difficulty of Paying Living Expenses:   Food Insecurity:   . Worried About Programme researcher, broadcasting/film/video in the Last Year:   . Barista in the Last Year:   Transportation Needs:   . Freight forwarder (Medical):   Marland Kitchen Lack of Transportation (Non-Medical):   Physical Activity:   . Days of Exercise per Week:   . Minutes of Exercise per Session:   Stress:   . Feeling of Stress :   Social Connections:   . Frequency of Communication with  Friends and Family:   . Frequency of Social Gatherings with Friends and Family:   . Attends Religious Services:   . Active Member of Clubs or Organizations:   . Attends Banker Meetings:   Marland Kitchen Marital Status:   Intimate Partner Violence:   . Fear of Current or Ex-Partner:   . Emotionally Abused:   Marland Kitchen Physically Abused:   . Sexually Abused:       Family History  Problem Relation Age of Onset  . Diabetes Mother   . Pneumonia Father     There were no vitals filed for this visit.  PHYSICAL EXAM: General:  super morbidly obese WM. No respiratory difficulty at rest HEENT: normal Neck: supple. Thick neck,  JVD assessment difficult. Carotids 2+ bilat; no bruits. No lymphadenopathy or thryomegaly appreciated. Cor: PMI nondisplaced. Regular rate & rhythm. No rubs, gallops or murmurs. Lungs: CTAB Abdomen: obese but soft, nontender, nondistended. No hepatosplenomegaly. No bruits or masses. Good bowel sounds. Extremities: no cyanosis, clubbing, rash, obese extremities w/ trace bilateral LEE. Rt foot in boot   Neuro: alert & oriented x 3, cranial nerves grossly intact. moves all 4 extremities w/o difficulty. Affect pleasant.   ASSESSMENT & PLAN:  1.  Chronic Diastolic Heart Failure/ Suspect Prominent RV Failure: - Echo 2/21 showed normal LVEF 60-65%, no LVH. RV normal. No significant valvular disease. RVSP estimate not outlined in study report. Despite report, suspect he has prominent RV dysfunction and pulmonary HTN, likely multifactorial. Suspect combination WHO Group 3 from untreated OSA/ OHS, Group 2 (diastolic dysfunction) and possibly Group 4 (w/ sedentary lifestyle and chronic LEE ?DVT>>CTEPH)    Arvilla Meres, MD  1:52 PM

## 2019-11-15 ENCOUNTER — Other Ambulatory Visit: Payer: Self-pay

## 2019-11-15 MED ORDER — PAIN MANAGEMENT IT PUMP REFILL: 1.0000 | Freq: Once | INTRATHECAL | 0 refills | Status: AC

## 2019-11-17 ENCOUNTER — Other Ambulatory Visit: Payer: Self-pay

## 2019-11-17 ENCOUNTER — Ambulatory Visit: Payer: Medicare Other

## 2019-11-17 DIAGNOSIS — G4733 Obstructive sleep apnea (adult) (pediatric): Secondary | ICD-10-CM | POA: Diagnosis not present

## 2019-12-05 ENCOUNTER — Telehealth: Payer: Self-pay | Admitting: Pulmonary Disease

## 2019-12-05 DIAGNOSIS — G4733 Obstructive sleep apnea (adult) (pediatric): Secondary | ICD-10-CM

## 2019-12-05 NOTE — Telephone Encounter (Signed)
Call patient  Sleep study result  Date of study: 11/17/2019  Impression: Mild obstructive sleep apnea, severe oxygen desaturations Context of oxygen desaturations during daytime  Recommendation: Recommend in lab titration study for management of obstructive sleep apnea He will require oxygen supplementation May require BiPAP for optimal management of sleep disordered breathing  Above may only be optimally treated with an in lab titration study

## 2019-12-05 NOTE — Telephone Encounter (Signed)
Patient contacted with results of home sleep study. Dr. Wynona Neat confirmed he wants the titration study, patient agrees to have study, order placed. Patient verbalized understanding of results and plan of care. He understands we will call him with results of the titration study.

## 2019-12-07 ENCOUNTER — Other Ambulatory Visit: Payer: Self-pay

## 2019-12-08 ENCOUNTER — Encounter: Payer: Self-pay | Admitting: Pain Medicine

## 2019-12-08 ENCOUNTER — Telehealth: Payer: Self-pay | Admitting: Pain Medicine

## 2019-12-08 ENCOUNTER — Other Ambulatory Visit: Payer: Self-pay

## 2019-12-08 ENCOUNTER — Ambulatory Visit: Payer: No Typology Code available for payment source | Attending: Pain Medicine | Admitting: Pain Medicine

## 2019-12-08 VITALS — BP 142/91 | HR 89 | Temp 97.0°F | Resp 20 | Ht 71.0 in | Wt 390.0 lb

## 2019-12-08 DIAGNOSIS — E559 Vitamin D deficiency, unspecified: Secondary | ICD-10-CM

## 2019-12-08 DIAGNOSIS — Z7982 Long term (current) use of aspirin: Secondary | ICD-10-CM | POA: Insufficient documentation

## 2019-12-08 DIAGNOSIS — M79605 Pain in left leg: Secondary | ICD-10-CM | POA: Diagnosis not present

## 2019-12-08 DIAGNOSIS — Z888 Allergy status to other drugs, medicaments and biological substances status: Secondary | ICD-10-CM | POA: Diagnosis not present

## 2019-12-08 DIAGNOSIS — M79604 Pain in right leg: Secondary | ICD-10-CM | POA: Diagnosis not present

## 2019-12-08 DIAGNOSIS — Z451 Encounter for adjustment and management of infusion pump: Secondary | ICD-10-CM | POA: Diagnosis not present

## 2019-12-08 DIAGNOSIS — M5137 Other intervertebral disc degeneration, lumbosacral region: Secondary | ICD-10-CM | POA: Insufficient documentation

## 2019-12-08 DIAGNOSIS — M961 Postlaminectomy syndrome, not elsewhere classified: Secondary | ICD-10-CM | POA: Diagnosis not present

## 2019-12-08 DIAGNOSIS — M545 Low back pain: Secondary | ICD-10-CM | POA: Diagnosis not present

## 2019-12-08 DIAGNOSIS — Z79899 Other long term (current) drug therapy: Secondary | ICD-10-CM | POA: Diagnosis not present

## 2019-12-08 DIAGNOSIS — G894 Chronic pain syndrome: Secondary | ICD-10-CM | POA: Diagnosis present

## 2019-12-08 DIAGNOSIS — Z978 Presence of other specified devices: Secondary | ICD-10-CM | POA: Diagnosis not present

## 2019-12-08 DIAGNOSIS — G8929 Other chronic pain: Secondary | ICD-10-CM

## 2019-12-08 DIAGNOSIS — M51379 Other intervertebral disc degeneration, lumbosacral region without mention of lumbar back pain or lower extremity pain: Secondary | ICD-10-CM

## 2019-12-08 DIAGNOSIS — Z79891 Long term (current) use of opiate analgesic: Secondary | ICD-10-CM

## 2019-12-08 DIAGNOSIS — K219 Gastro-esophageal reflux disease without esophagitis: Secondary | ICD-10-CM

## 2019-12-08 MED ORDER — MORPHINE SULFATE ER 30 MG PO TBCR: 30.0000 mg | EXTENDED_RELEASE_TABLET | Freq: Two times a day (BID) | ORAL | 0 refills | Status: DC

## 2019-12-08 NOTE — Progress Notes (Signed)
PROVIDER NOTE: Information contained herein reflects review and annotations entered in association with encounter. Interpretation of such information and data should be left to medically-trained personnel. Information provided to patient can be located elsewhere in the medical record under "Patient Instructions". Document created using STT-dictation technology, any transcriptional errors that may result from process are unintentional.    Patient: [name]  Service Category: Procedure  Provider: Gaspar Cola, MD  DOB: August 02, 1965  DOS: 12/08/2019  Location: [facility]  MRN: [id number]  Setting: Ambulatory - outpatient  Referring Provider: Antony Contras, MD  Type: Established Patient  Specialty: Interventional Pain Management  PCP: Antony Contras, MD   Primary Reason for Visit: Interventional Pain Management Treatment. CC: Back Pain  Procedure:          Intrathecal Drug Delivery System (IDDS):  Type: Reservoir Refill [phone]) No rate change Region: Abdominal Laterality: Left  Type of Pump: Medtronic Synchromed II Delivery Route: Intrathecal Type of Pain Treated: Neuropathic/Nociceptive Primary Medication Class: Opioid/opiate  Medication, Concentration, Infusion Program, & Delivery Rate: Please see scanned programming printout.   Indications: 1. Chronic pain syndrome   2. Chronic low back pain (Primary Area of Pain) (Bilateral) (R>L)   3. Chronic lower extremity pain (Secondary area of Pain) (Bilateral) (L>R)   4. DDD (degenerative disc disease), lumbosacral   5. Failed back surgical syndrome   6. Pharmacologic therapy   7. Presence of intrathecal pump   8. Encounter for adjustment or management of infusion pump   9. Long term prescription benzodiazepine use   10. Long term prescription opiate use    Pain Assessment: Self-Reported Pain Score: 6 /10             Reported level is compatible with observation.         Today the patient was informed of the  national shortage and the oxycodone.  Today we will be switching his oxycodone IR 10 mg, 1 tablet p.o. every 6 hours (40 mg/day of oxycodone) (60 MME) to MS Contin 30 mg, 1 tablet p.o. twice daily (60 mg/day of morphine) (60 MME).  Transfer: Tizanidine (Zanaflex) 4 mg tablet, 1 tablet p.o. every 8 hours (90/month) (04/17/2020); cyclobenzaprine (Flexeril) 10 mg tablet, 1 tablet p.o. at bedtime (30/month) (04/17/2020); esomeprazole (Nexium) 40 mg capsule, 1 capsule p.o. daily (30/month) (03/26/2020); pregabalin (Lyrica) 150 mg capsule, 1 capsule p.o. 3 times daily (90/month) (04/17/2020); lubiprostone (Amitiza) 24 mcg capsule, 1 capsule p.o. twice daily with meals (60/month) (03/21/2020); calcium carbonate (calcium 600) 600 mg tablet, 2 tablets p.o. daily in a.m. (60/month) (03/26/2020); cholecalciferol (vitamin D3) 125 mcg (5000 IU) caps, 1 capsule p.o. daily with breakfast (30/month) (03/26/2020)  Pharmacotherapy Assessment  Analgesic: Oxycodone IR 10 mg, 1 tab PO q 6 hrs (40 mg/day of oxycodone)+ 52.9 mcg/hr intrathecal PF-Fentanyl (1,268.8 mcg/day of Fentanyl) MME/day:23m/day(from oral medication)+ 126.96 mg/day (from intrathecal fentanyl) = 186.96MME/day(Aprox. 1.08MME/day/kg).   Monitoring: [place] PMP: PDMP reviewed during this encounter.       Pharmacotherapy: No side-effects or adverse reactions reported. Compliance: No problems identified. Effectiveness: Clinically acceptable. Plan: Refer to "POC".  UDS:  Summary  Date Value Ref Range Status  10/24/2019 Note  Final    Comment:    ==================================================================== [employer] Select 13 (MW) ==================================================================== Test                             Result       Flag  Units  Drug Present and Declared for Prescription Verification   Fentanyl                       40           EXPECTED   ng/mg creat   Norfentanyl                    56           EXPECTED    ng/mg creat    Source of fentanyl is a scheduled prescription medication, including    IV, patch, and transmucosal formulations. Norfentanyl is an expected    metabolite of fentanyl.  Drug Absent but Declared for Prescription Verification   Oxycodone                      Not Detected UNEXPECTED ng/mg creat ==================================================================== Test                      Result    Flag   Units      Ref Range   Creatinine              91               mg/dL      >=20 ==================================================================== Declared Medications:  The flagging and interpretation on this report are based on the  following declared medications.  Unexpected results may arise from  inaccuracies in the declared medications.   **Note: The testing scope of this panel includes these medications:   Fentanyl  Oxycodone   **Note: The testing scope of this panel does not include the  following reported medications:   Acetaminophen (Tylenol)  Aspirin  Baclofen  Bupivacaine  Calcium  Cyclobenzaprine (Flexeril)  Escitalopram (Lexapro)  Esomeprazole (Nexium)  Ibuprofen (Advil)  Lubiprostone (Amitiza)  Metolazone (Zaroxolyn)  Potassium  Pregabalin (Lyrica)  Promethazine (Phenergan)  Rosuvastatin (Crestor)  Tizanidine (Zanaflex)  Torsemide (Demadex)  Vitamin D3 ==================================================================== For clinical consultation, please call [phone]. ====================================================================     Intrathecal Pump Therapy Assessment  Manufacturer: Medtronic Synchromed Type: Programmable Volume: 40 mL reservoir MRI compatibility: Yes   Drug content:  Primary Medication Class:Opioid Primary Medication:PF-Fentanyl(2,000.0 mcg/ml) Secondary Medication:PF-Bupivacaine(20.4m/mL) Other Medication:PF-Baclofen (200 mcg/mL)added to the pump on 05/26/2019   Programming:  Type:  Simple continuous. See pump readout for details.   Changes:  Medication Change: None at this point Rate Change: No change in rate  Reported side-effects or adverse reactions: None reported  Effectiveness: Described as relatively effective, allowing for increase in activities of daily living (ADL) Clinically meaningful improvement in function (CMIF): Sustained CMIF goals met  Plan: Pump refill today  Pre-op Assessment:  Mr. HIshaqis a 54y.o. (year old), male patient, seen today for interventional treatment. He  has a past surgical history that includes Back surgery; morphine pump; Tonsillectomy; Joint replacement; Shoulder arthroscopy with subacromial decompression, rotator cuff repair and bicep tendon repair (Right, 08/18/2013); Shoulder arthroscopy with subacromial decompression and bicep tendon repair (Left, 10/06/2013); Laparoscopic gastric sleeve resection (N/A, 01/08/2015); and RIGHT HEART CATH (N/A, 09/22/2019). Mr. HSavagehas a current medication list which includes the following prescription(s): acetaminophen, aspirin ec, calcium carbonate, vitamin d3, cyclobenzaprine, escitalopram, esomeprazole, gabapentin, ibuprofen, lubiprostone, metolazone, NON FORMULARY, potassium chloride, promethazine, rosuvastatin, tizanidine, torsemide, morphine, [START ON 01/07/2020] morphine, [START ON 02/06/2020] morphine, and pregabalin. His primarily concern today is the Back Pain  Initial Vital Signs:  Pulse/HCG Rate: 89  Temp: (!) 97 F (36.1 C) Resp: 20 BP: (!) 142/91 SpO2: 100 % (O2 at 6L)  BMI: Estimated body mass index is 40.45 kg/m as calculated from the following:   Height as of this encounter: $RemoveBeforeD'5\' 11"'[id number]$  (1.803 m).   Weight as of this encounter: 290 lb (131.5 kg).  Risk Assessment: Allergies: Reviewed. He is allergic to tape.  Allergy Precautions: None required Coagulopathies: Reviewed. None identified.  Blood-thinner therapy: None at this time Active Infection(s): Reviewed. None identified.  Mr. Housand is afebrile  Site Confirmation: Mr. Marineau was asked to confirm the procedure and laterality before marking the site Procedure checklist: Completed Consent: Before the procedure and under the influence of no sedative(s), amnesic(s), or anxiolytics, the patient was informed of the treatment options, risks and possible complications. To fulfill our ethical and legal obligations, as recommended by the American Medical Association's Code of Ethics, I have informed the patient of my clinical impression; the nature and purpose of the treatment or procedure; the risks, benefits, and possible complications of the intervention; the alternatives, including doing nothing; the risk(s) and benefit(s) of the alternative treatment(s) or procedure(s); and the risk(s) and benefit(s) of doing nothing.  Mr. Henery was provided with information about the general risks and possible complications associated with most interventional procedures. These include, but are not limited to: failure to achieve desired goals, infection, bleeding, organ or nerve damage, allergic reactions, paralysis, and/or death.  In addition, he was informed of those risks and possible complications associated to this particular procedure, which include, but are not limited to: damage to the implant; failure to decrease pain; local, systemic, or serious CNS infections, intraspinal abscess with possible cord compression and paralysis, or life-threatening such as meningitis; bleeding; organ damage; nerve injury or damage with subsequent sensory, motor, and/or autonomic system dysfunction, resulting in transient or permanent pain, numbness, and/or weakness of one or several areas of the body; allergic reactions, either minor or major life-threatening, such as anaphylactic or anaphylactoid reactions.  Furthermore, Mr. Demuro was informed of those risks and complications associated with the medications. These include, but are not limited to:  allergic reactions (i.e.: anaphylactic or anaphylactoid reactions); endorphine suppression; bradycardia and/or hypotension; water retention and/or peripheral vascular relaxation leading to lower extremity edema and possible stasis ulcers; respiratory depression and/or shortness of breath; decreased metabolic rate leading to weight gain; swelling or edema; medication-induced neural toxicity; particulate matter embolism and blood vessel occlusion with resultant organ, and/or nervous system infarction; and/or intrathecal granuloma formation with possible spinal cord compression and permanent paralysis.  Before refilling the pump Mr. Grob was informed that some of the medications used in the devise may not be FDA approved for such use and therefore it constitutes an off-label use of the medications.  Finally, he was informed that Medicine is not an exact science; therefore, there is also the possibility of unforeseen or unpredictable risks and/or possible complications that may result in a catastrophic outcome. The patient indicated having understood very clearly. We have given the patient no guarantees and we have made no promises. Enough time was given to the patient to ask questions, all of which were answered to the patient's satisfaction. Mr. Monteforte has indicated that he wanted to continue with the procedure. Attestation: I, the ordering provider, attest that I have discussed with the patient the benefits, risks, side-effects, alternatives, likelihood of achieving goals, and potential problems during recovery for the procedure that I have provided informed consent. Date  Time: 12/08/2019 11:12 AM  Pre-Procedure Preparation:  Monitoring: As per clinic protocol. Respiration, ETCO2, SpO2, BP, heart rate and rhythm monitor placed and checked for adequate function Safety Precautions: Patient was assessed for positional comfort and pressure points before starting the procedure. Time-out: I initiated and  conducted the "Time-out" before starting the procedure, as per protocol. The patient was asked to participate by confirming the accuracy of the "Time Out" information. Verification of the correct person, site, and procedure were performed and confirmed by me, the nursing staff, and the patient. "Time-out" conducted as per Joint Commission's Universal Protocol (UP.01.01.01). Time: 1105  Description of Procedure:          Position: Supine Target Area: Central-port of intrathecal pump. Approach: Anterior, 90 degree angle approach. Area Prepped: Entire Area around the pump implant. DuraPrep (Iodine Povacrylex [0.7% available iodine] and Isopropyl Alcohol, 74% w/w) Safety Precautions: Aspiration looking for blood return was conducted prior to all injections. At no point did we inject any substances, as a needle was being advanced. No attempts were made at seeking any paresthesias. Safe injection practices and needle disposal techniques used. Medications properly checked for expiration dates. SDV (single dose vial) medications used. Description of the Procedure: Protocol guidelines were followed. Two nurses trained to do implant refills were present during the entire procedure. The refill medication was checked by both healthcare providers as well as the patient. The patient was included in the "Time-out" to verify the medication. The patient was placed in position. The pump was identified. The area was prepped in the usual manner. The sterile template was positioned over the pump, making sure the side-port location matched that of the pump. Both, the pump and the template were held for stability. The needle provided in the Medtronic Kit was then introduced thru the center of the template and into the central port. The pump content was aspirated and discarded volume documented. The new medication was slowly infused into the pump, thru the filter, making sure to avoid overpressure of the device. The needle was  then removed and the area cleansed, making sure to leave some of the prepping solution back to take advantage of its long term bactericidal properties. The pump was interrogated and programmed to reflect the correct medication, volume, and dosage. The program was printed and taken to the physician for approval. Once checked and signed by the physician, a copy was provided to the patient and another scanned into the EMR. Vitals:   12/08/19 1111  BP: (!) 142/91  Pulse: 89  Resp: 20  Temp: (!) 97 F (36.1 C)  TempSrc: Temporal  SpO2: 100%  Weight: 290 lb (131.5 kg)  Height: _0  (1.803 m)    Start Time: 1125 hrs. End Time: 1138 hrs. Materials & Medications: Medtronic Refill Kit Medication(s): Please see chart orders for details.  Imaging Guidance:          Type of Imaging Technique: None used Indication(s): N/A Exposure Time: No patient exposure Contrast: None used. Fluoroscopic Guidance: N/A Ultrasound Guidance: N/A Interpretation: N/A  Antibiotic Prophylaxis:   Anti-infectives (From admission, onward)   None     Indication(s): None identified  Post-operative Assessment:  Post-procedure Vital Signs:  Pulse/HCG Rate: 89  Temp: (!) 97 F (36.1 C) Resp: 20 BP: (!) 142/91 SpO2: 100 % (O2 at 6L)  EBL: None  Complications: No immediate post-treatment complications observed by team, or reported by patient.  Note: The patient tolerated the entire procedure well. A repeat set of vitals were taken after the procedure and the patient was  kept under observation following institutional policy, for this type of procedure. Post-procedural neurological assessment was performed, showing return to baseline, prior to discharge. The patient was provided with post-procedure discharge instructions, including a section on how to identify potential problems. Should any problems arise concerning this procedure, the patient was given instructions to immediately contact us, at any time, without  hesitation. In any case, we plan to contact the patient by telephone for a follow-up status report regarding this interventional procedure.  Comments:  No additional relevant information.  Plan of Care  Orders:  Orders Placed This Encounter  Procedures  . PUMP REFILL    Maintain Protocol by having two(2) healthcare providers during procedure and programming.    Scheduling Instructions:     Please refill intrathecal pump today.    Order Specific Question:   Where will this procedure be performed?    Answer:   [facility]  . PUMP REFILL    Whenever possible schedule on a procedure today.    Standing Status:   Future    Standing Expiration Date:   05/06/2020    Scheduling Instructions:     Please schedule intrathecal pump refill based on pump programming. Avoid schedule intervals of more than 120 days (4 months).    Order Specific Question:   Where will this procedure be performed?    Answer:   [facility]  . Informed Consent Details: Physician/Practitioner Attestation; Transcribe to consent form and obtain patient signature    Provider Attestation: I, Lathrop Dossie Arbour, MD, (Pain Management Specialist), the physician/practitioner, attest that I have discussed with the patient the benefits, risks, side effects, alternatives, likelihood of achieving goals and potential problems during recovery for the procedure that I have provided informed consent.    Scheduling Instructions:     Procedure: Intrathecal Pump Refill     Attending Physician: Beatriz Chancellor A. Dossie Arbour, MD     Indications: Chronic Pain Syndrome (G89.4)     Transcribe to consent form and obtain patient signature.   Chronic Opioid Analgesic:  Oxycodone IR 10 mg, 1 tab PO q 6 hrs (40 mg/day of oxycodone)+ 52.9 mcg/hr intrathecal PF-Fentanyl (1,268.8 mcg/day of Fentanyl) MME/day:29m/day(from oral medication)+ 126.96 mg/day (from intrathecal fentanyl) = 186.96MME/day(Aprox. 1.08MME/day/kg).   Medications  ordered for procedure: Meds ordered this encounter  Medications  . morphine (MS CONTIN) 30 MG 12 hr tablet    Sig: Take 1 tablet (30 mg total) by mouth every 12 (twelve) hours. Must last 30 days. Do not break tablet    Dispense:  60 tablet    Refill:  0    Chronic Pain: STOP Act (Not applicable) Fill 1 day early if closed on refill date. Do not fill until: 12/08/2019. To last until: 01/07/2020. Avoid benzodiazepines within 8 hours of opioids  . morphine (MS CONTIN) 30 MG 12 hr tablet    Sig: Take 1 tablet (30 mg total) by mouth every 12 (twelve) hours. Must last 30 days. Do not break tablet    Dispense:  60 tablet    Refill:  0    Chronic Pain: STOP Act (Not applicable) Fill 1 day early if closed on refill date. Do not fill until: 01/07/2020. To last until: 02/06/2020. Avoid benzodiazepines within 8 hours of opioids  . morphine (MS CONTIN) 30 MG 12 hr tablet    Sig: Take 1 tablet (30 mg total) by mouth every 12 (twelve) hours. Must last 30 days. Do not break tablet    Dispense:  60 tablet  Refill:  0    Chronic Pain: STOP Act (Not applicable) Fill 1 day early if closed on refill date. Do not fill until: 02/06/2020. To last until: 03/07/2020. Avoid benzodiazepines within 8 hours of opioids   Medications administered: Jeneen Rinks B. Micalizzi had no medications administered during this visit.  See the medical record for exact dosing, route, and time of administration.  Follow-up plan:   Return for Pump Refill (Max:74mo.       Interventional management options: Considering: NOTE: NO RFA until BMI <35  Palliative/therapeuticintrathecal pump refill and maintenance Diagnostic left Genicular NB Possible left Genicular nerve RFA Possible bilateral lumbar facet RFA(NO Lumbar RFA until BMI <35) Diagnostic caudal ESI+epidurogram Possible Racz procedure Diagnostic bilateral IA shoulder joint injection Diagnostic bilateral suprascapular NB Possible bilateral suprascapular nerve  RFA Diagnostic LESI vs TFESI   Palliative PRN treatment(s): Palliative/therapeuticintrathecal pump refill and maintenance Diagnostic bilateral lumbar facet block #2       Recent Visits Date Type Provider Dept  10/20/19 Procedure visit NMilinda Pointer MD [facility]  Showing recent visits within past 90 days and meeting all other requirements Today's Visits Date Type Provider Dept  12/08/19 Procedure visit NMilinda Pointer MD [facility]  Showing today's visits and meeting all other requirements Future Appointments Date Type Provider Dept  01/24/20 Appointment NMilinda Pointer MD [facility]  Showing future appointments within next 90 days and meeting all other requirements  Disposition: Discharge home  Discharge (Date  Time): 12/08/2019; 1150 hrs.   Primary Care Physician: SAntony Contras MD [facility] Note by: FGaspar Cola MD Date: 12/08/2019; Time: 12:03 PM  Disclaimer:  Medicine is not an eChief Strategy Officer The only guarantee in medicine is that nothing is guaranteed. It is important to note that the decision to proceed with this intervention was based on the information collected from the patient. The Data and conclusions were drawn from the patient's questionnaire, the interview, and the physical examination. Because the information was provided in large part by the patient, it cannot be guaranteed that it has not been purposely or unconsciously manipulated. Every effort has been made to obtain as much relevant data as possible for this evaluation. It is important to note that the conclusions that lead to this procedure are derived in large part from the available data. Always take into account that the treatment will also be dependent on availability of resources and existing treatment guidelines, considered by other Pain Management Practitioners as being common knowledge and practice, at the  time of the intervention. For Medico-Legal purposes, it is also important to point out that variation in procedural techniques and pharmacological choices are the acceptable norm. The indications, contraindications, technique, and results of the above procedure should only be interpreted and judged by a Board-Certified Interventional Pain Specialist with extensive familiarity and expertise in the same exact procedure and technique.

## 2019-12-08 NOTE — Progress Notes (Signed)
Nursing Pain Medication Assessment:  Safety precautions to be maintained throughout the outpatient stay will include: orient to surroundings, keep bed in low position, maintain call bell within reach at all times, provide assistance with transfer out of bed and ambulation.  Medication Inspection Compliance: Pill count conducted under aseptic conditions, in front of the patient. Neither the pills nor the bottle was removed from the patient's sight at any time. Once count was completed pills were immediately returned to the patient in their original bottle.  Medication: Oxycodone IR Pill/Patch Count: 52 of 120 pills remain Pill/Patch Appearance: Markings consistent with prescribed medication Bottle Appearance: Standard pharmacy container. Clearly labeled. Filled Date: 11/22/2019 Last Medication intake:  Today

## 2019-12-08 NOTE — Telephone Encounter (Signed)
Pharmacy calling to confirm that Morphine is replacing Oxycodone. Prescriptions at the pharmacy for Oxycodone with future dates cancelled.

## 2019-12-08 NOTE — Patient Instructions (Addendum)
____________________________________________________________________________________________  Drug Holidays (Slow)  What is a "Drug Holiday"? Drug Holiday: is the name given to the period of time during which a patient stops taking a medication(s) for the purpose of eliminating tolerance to the drug.  Benefits  Improved effectiveness of opioids.  Decreased opioid dose needed to achieve benefits.  Improved pain with lesser dose.  What is tolerance? Tolerance: is the progressive decreased in effectiveness of a drug due to its repetitive use. With repetitive use, the body gets use to the medication and as a consequence, it loses its effectiveness. This is a common problem seen with opioid pain medications. As a result, a larger dose of the drug is needed to achieve the same effect that used to be obtained with a smaller dose.  How long should a "Drug Holiday" last? You should stay off of the pain medicine for at least 14 consecutive days. (2 weeks)  Should I stop the medicine "cold [place]"? No. You should always coordinate with your Pain Specialist so that he/she can provide you with the correct medication dose to make the transition as smoothly as possible.  How do I stop the medicine? Slowly. You will be instructed to decrease the daily amount of pills that you take by one (1) pill every seven (7) days. This is called a "slow downward taper" of your dose. For example: if you normally take four (4) pills per day, you will be asked to drop this dose to three (3) pills per day for seven (7) days, then to two (2) pills per day for seven (7) days, then to one (1) per day for seven (7) days, and at the end of those last seven (7) days, this is when the "Drug Holiday" would start.   Will I have withdrawals? By doing a "slow downward taper" like this one, it is unlikely that you will experience any significant withdrawal symptoms. Typically, what triggers withdrawals is the sudden stop of a high  dose opioid therapy. Withdrawals can usually be avoided by slowly decreasing the dose over a prolonged period of time. If you do not follow these instructions and decide to stop your medication abruptly, withdrawals may be possible.  What are withdrawals? Withdrawals: refers to the wide range of symptoms that occur after stopping or dramatically reducing opiate drugs after heavy and prolonged use. Withdrawal symptoms do not occur to patients that use low dose opioids, or those who take the medication sporadically. Contrary to benzodiazepine (example: Valium, Xanax, etc.) or alcohol withdrawals (Delirium Tremens), opioid withdrawals are not lethal. Withdrawals are the physical manifestation of the body getting rid of the excess receptors.  Expected Symptoms Early symptoms of withdrawal may include:  Agitation  Anxiety  Muscle aches  Increased tearing  Insomnia  Runny nose  Sweating  Yawning  Late symptoms of withdrawal may include:  Abdominal cramping  Diarrhea  Dilated pupils  Goose bumps  Nausea  Vomiting  Will I experience withdrawals? Due to the slow nature of the taper, it is very unlikely that you will experience any.  What is a slow taper? Taper: refers to the gradual decrease in dose.  (Last update: 11/02/2019) ____________________________________________________________________________________________    ____________________________________________________________________________________________  Medication Rules  Purpose: To inform patients, and their family members, of our rules and regulations.  Applies to: All patients receiving prescriptions (written or electronic).  Pharmacy of record: Pharmacy where electronic prescriptions will be sent. If written prescriptions are taken to a different pharmacy, please inform the nursing staff. The pharmacy  listed in the electronic medical record should be the one where you would like electronic prescriptions  to be sent.  Electronic prescriptions: In compliance with the [place] (STOP) Act of 2017 (Session Lanny Cramp [phone]), effective April 14, 2018, all controlled substances must be electronically prescribed. Calling prescriptions to the pharmacy will cease to exist.  Prescription refills: Only during scheduled appointments. Applies to all prescriptions.  NOTE: The following applies primarily to controlled substances (Opioid* Pain Medications).   Type of encounter (visit): For patients receiving controlled substances, face-to-face visits are required. (Not an option or up to the patient.)  Patient's responsibilities: 1. Pain Pills: Bring all pain pills to every appointment (except for procedure appointments). 2. Pill Bottles: Bring pills in original pharmacy bottle. Always bring the newest bottle. Bring bottle, even if empty. 3. Medication refills: You are responsible for knowing and keeping track of what medications you take and those you need refilled. The day before your appointment: write a list of all prescriptions that need to be refilled. The day of the appointment: give the list to the admitting nurse. Prescriptions will be written only during appointments. No prescriptions will be written on procedure days. If you forget a medication: it will not be "Called in", "Faxed", or "electronically sent". You will need to get another appointment to get these prescribed. No early refills. Do not call asking to have your prescription filled early. 4. Prescription Accuracy: You are responsible for carefully inspecting your prescriptions before leaving our office. Have the discharge nurse carefully go over each prescription with you, before taking them home. Make sure that your name is accurately spelled, that your address is correct. Check the name and dose of your medication to make sure it is accurate. Check the number of pills, and the written instructions to  make sure they are clear and accurate. Make sure that you are given enough medication to last until your next medication refill appointment. 5. Taking Medication: Take medication as prescribed. When it comes to controlled substances, taking less pills or less frequently than prescribed is permitted and encouraged. Never take more pills than instructed. Never take medication more frequently than prescribed.  6. Inform other Doctors: Always inform, all of your healthcare providers, of all the medications you take. 7. Pain Medication from other Providers: You are not allowed to accept any additional pain medication from any other Doctor or Healthcare provider. There are two exceptions to this rule. (see below) In the event that you require additional pain medication, you are responsible for notifying us, as stated below. 8. Medication Agreement: You are responsible for carefully reading and following our Medication Agreement. This must be signed before receiving any prescriptions from our practice. Safely store a copy of your signed Agreement. Violations to the Agreement will result in no further prescriptions. (Additional copies of our Medication Agreement are available upon request.) 9. Laws, Rules, & Regulations: All patients are expected to follow all Federal and [employer], [employer], Rules, [employer] [facility]. Ignorance of the Laws does not constitute a valid excuse.  10. Illegal drugs and Controlled Substances: The use of illegal substances (including, but not limited to marijuana and its derivatives) and/or the illegal use of any controlled substances is strictly prohibited. Violation of this rule may result in the immediate and permanent discontinuation of any and all prescriptions being written by our practice. The use of any illegal substances is prohibited. 11. Adopted CDC guidelines & recommendations: Target dosing levels will be at or  below 60 MME/day. Use of benzodiazepines** is not  recommended.  Exceptions: There are only two exceptions to the rule of not receiving pain medications from other Healthcare Providers. 1. Exception #1 (Emergencies): In the event of an emergency (i.e.: accident requiring emergency care), you are allowed to receive additional pain medication. However, you are responsible for: As soon as you are able, call our office [phone], at any time of the day or night, and leave a message stating your name, the date and nature of the emergency, and the name and dose of the medication prescribed. In the event that your call is answered by a member of our staff, make sure to document and save the date, time, and the name of the person that took your information.  2. Exception #2 (Planned Surgery): In the event that you are scheduled by another doctor or dentist to have any type of surgery or procedure, you are allowed (for a period no longer than 30 days), to receive additional pain medication, for the acute post-op pain. However, in this case, you are responsible for picking up a copy of our "Post-op Pain Management for Surgeons" handout, and giving it to your surgeon or dentist. This document is available at our office, and does not require an appointment to obtain it. Simply go to our office during business hours (Monday-Thursday from 8:00 AM to 4:00 PM) (Friday 8:00 AM to 12:00 Noon) or if you have a scheduled appointment with [place], prior to your surgery, and ask for it by name. In addition, you will need to provide [place] with your name, name of your surgeon, type of surgery, and date of procedure or surgery.  *Opioid medications include: morphine, codeine, oxycodone, oxymorphone, hydrocodone, hydromorphone, meperidine, tramadol, tapentadol, buprenorphine, fentanyl, methadone. **Benzodiazepine medications include: diazepam (Valium), alprazolam (Xanax), clonazepam (Klonopine), lorazepam (Ativan), clorazepate (Tranxene), chlordiazepoxide (Librium), estazolam (Prosom),  oxazepam (Serax), temazepam (Restoril), triazolam (Halcion) (Last updated: 06/11/2017) ____________________________________________________________________________________________   ____________________________________________________________________________________________  Medication Recommendations and Reminders  Applies to: All patients receiving prescriptions (written and/or electronic).  Medication Rules & Regulations: These rules and regulations exist for your safety and that of others. They are not flexible and neither are we. Dismissing or ignoring them will be considered "non-compliance" with medication therapy, resulting in complete and irreversible termination of such therapy. (See document titled "Medication Rules" for more details.) In all conscience, because of safety reasons, we cannot continue providing a therapy where the patient does not follow instructions.  Pharmacy of record:   Definition: This is the pharmacy where your electronic prescriptions will be sent.   We do not endorse any particular pharmacy, however, we have experienced problems with [facility] not securing enough medication supply for the community.  We do not restrict you in your choice of pharmacy. However, once we write for your prescriptions, we will NOT be re-sending more prescriptions to fix restricted supply problems created by your pharmacy, or your insurance.   The pharmacy listed in the electronic medical record should be the one where you want electronic prescriptions to be sent.  If you choose to change pharmacy, simply notify our nursing staff.  Recommendations:  Keep all of your pain medications in a safe place, under lock and key, even if you live alone. We will NOT replace lost, stolen, or damaged medication.  After you fill your prescription, take 1 week's worth of pills and put them away in a safe place. You should keep a separate, properly labeled bottle for this purpose. The remainder  should be kept in the original bottle. Use this as your primary supply, until it runs out. Once it's gone, then you know that you have 1 week's worth of medicine, and it is time to come in for a prescription refill. If you do this correctly, it is unlikely that you will ever run out of medicine.  To make sure that the above recommendation works, it is very important that you make sure your medication refill appointments are scheduled at least 1 week before you run out of medicine. To do this in an effective manner, make sure that you do not leave the office without scheduling your next medication management appointment. Always ask the nursing staff to show you in your prescription , when your medication will be running out. Then arrange for the receptionist to get you a return appointment, at least 7 days before you run out of medicine. Do not wait until you have 1 or 2 pills left, to come in. This is very poor planning and does not take into consideration that we may need to cancel appointments due to bad weather, sickness, or emergencies affecting our staff.  DO NOT ACCEPT A "Partial Fill": If for any reason your pharmacy does not have enough pills/tablets to completely fill or refill your prescription, do not allow for a "partial fill". The law allows the pharmacy to complete that prescription within 72 hours, without requiring a new prescription. If they do not fill the rest of your prescription within those 72 hours, you will need a separate prescription to fill the remaining amount, which we will NOT provide. If the reason for the partial fill is your insurance, you will need to talk to the pharmacist about payment alternatives for the remaining tablets, but again, DO NOT ACCEPT A PARTIAL FILL, unless you can trust your pharmacist to obtain the remainder of the pills within 72 hours.  Prescription refills and/or changes in medication(s):   Prescription refills, and/or changes in dose or medication,  will be conducted only during scheduled medication management appointments. (Applies to both, written and electronic prescriptions.)  No refills on procedure days. No medication will be changed or started on procedure days. No changes, adjustments, and/or refills will be conducted on a procedure day. Doing so will interfere with the diagnostic portion of the procedure.  No phone refills. No medications will be "called into the pharmacy".  No Fax refills.  No weekend refills.  No Holliday refills.  No after hours refills.  Remember:  Business hours are:  Monday to Thursday 8:00 AM to 4:00 PM Provider's Schedule: Milinda Pointer, MD - Appointments are:  Medication management: Monday and Wednesday 8:00 AM to 4:00 PM Procedure day: Tuesday and Thursday 7:30 AM to 4:00 PM Gillis Santa, MD - Appointments are:  Medication management: Tuesday and Thursday 8:00 AM to 4:00 PM Procedure day: Monday and Wednesday 7:30 AM to 4:00 PM (Last update: 11/02/2019) ____________________________________________________________________________________________   ____________________________________________________________________________________________  CBD (cannabidiol) WARNING  Applicable to: All individuals currently taking or considering taking CBD (cannabidiol) and, more important, all patients taking opioid analgesic controlled substances (pain medication). (Example: oxycodone; oxymorphone; hydrocodone; hydromorphone; morphine; methadone; tramadol; tapentadol; fentanyl; buprenorphine; butorphanol; dextromethorphan; meperidine; codeine; etc.)  Legal status: CBD remains a Schedule I drug prohibited for any use. CBD is illegal with one exception. In the [place], CBD has a limited Transport planner (FDA) approval for the treatment of two specific types of epilepsy disorders. Only one CBD product has been approved by the  FDA for this purpose: "Epidiolex". FDA is aware that some  companies are marketing products containing cannabis and cannabis-derived compounds in ways that violate the [employer], Drug and Cosmetic Act [facility] Act) and that may put the health and safety of consumers at risk. The FDA, a Federal agency, has not enforced the CBD status since 2018.   Legality: Some manufacturers ship CBD products nationally, which is illegal. Often such products are sold online and are therefore available throughout the country. CBD is openly sold in head shops and health food stores in some states where such sales have not been explicitly legalized. Selling unapproved products with unsubstantiated therapeutic claims is not only a violation of the law, but also can put patients at risk, as these products have not been proven to be safe or effective. Federal illegality makes it difficult to conduct research on CBD.  Reference: "FDA Regulation of Cannabis and Cannabis-Derived Products, Including Cannabidiol (CBD)" - [employer]  Warning: CBD is not FDA approved and has not undergo the same manufacturing controls as prescription drugs.  This means that the purity and safety of available CBD may be questionable. Most of the time, despite manufacturer's claims, it is contaminated with THC (delta-9-tetrahydrocannabinol - the chemical in marijuana responsible for the "HIGH").  When this is the case, the [facility] contaminant will trigger a positive urine drug screen (UDS) test for Marijuana (carboxy-THC). Because a positive UDS for any illicit substance is a violation of our medication agreement, your opioid analgesics (pain medicine) may be permanently discontinued.  MORE ABOUT CBD  General Information: CBD  is a derivative of the Marijuana (cannabis sativa) plant discovered in 36. It is one of the 113 identified substances found in Marijuana. It accounts for up to 40% of the  plant's extract. As of 2018, preliminary clinical studies on CBD included research for the treatment of anxiety, movement disorders, and pain. CBD is available and consumed in multiple forms, including inhalation of smoke or vapor, as an aerosol spray, and by mouth. It may be supplied as an oil containing CBD, capsules, dried cannabis, or as a liquid solution. CBD is thought not to be as psychoactive as THC (delta-9-tetrahydrocannabinol - the chemical in marijuana responsible for the "HIGH"). Studies suggest that CBD may interact with different biological target receptors in the body, including cannabinoid and other neurotransmitter receptors. As of 2018 the mechanism of action for its biological effects has not been determined.  Side-effects   Adverse reactions: Dry mouth, diarrhea, decreased appetite, fatigue, drowsiness, malaise, weakness, sleep disturbances, and others.  Drug interactions: CBC may interact with other medications such as blood-thinners. (Last update: 11/19/2019) ____________________________________________________________________________________________ Your pain medication has been changed to Morphine because of the Oxycodone shortage.

## 2019-12-08 NOTE — Telephone Encounter (Signed)
Pharmacy lvmail asking nurse to call for medication verification   [phone] opt 1

## 2019-12-09 ENCOUNTER — Telehealth: Payer: Self-pay

## 2019-12-09 MED FILL — Medication: INTRATHECAL | Qty: 1 | Status: AC

## 2019-12-09 NOTE — Telephone Encounter (Signed)
Post procedure phone call.  LM 

## 2019-12-13 ENCOUNTER — Encounter (HOSPITAL_COMMUNITY): Payer: Self-pay

## 2019-12-13 ENCOUNTER — Ambulatory Visit (HOSPITAL_COMMUNITY)
Admission: RE | Admit: 2019-12-13 | Discharge: 2019-12-13 | Disposition: A | Discharge status: Home / Self Care | Payer: Medicare Other | Source: Ambulatory Visit | Attending: Cardiology | Admitting: Cardiology

## 2019-12-13 ENCOUNTER — Other Ambulatory Visit: Payer: Self-pay

## 2019-12-13 ENCOUNTER — Encounter (HOSPITAL_COMMUNITY): Payer: Self-pay | Admitting: Cardiology

## 2019-12-13 VITALS — Wt 398.0 lb

## 2019-12-13 DIAGNOSIS — I5032 Chronic diastolic (congestive) heart failure: Secondary | ICD-10-CM

## 2019-12-13 DIAGNOSIS — I272 Pulmonary hypertension, unspecified: Secondary | ICD-10-CM

## 2019-12-13 NOTE — Patient Instructions (Signed)
Labs needed in 10-14 days  Your physician recommends that you schedule a follow-up appointment in: 3 months  You have been referred to [facility] for a VQ Scan -they will call for an appointment  Do the following things EVERYDAY: 1) Weigh yourself in the morning before breakfast. Write it down and keep it in a log. 2) Take your medicines as prescribed 3) Eat low salt foods--Limit salt (sodium) to 2000 mg per day.  4) Stay as active as you can everyday 5) Limit all fluids for the day to less than 2 liters  If you have any questions or concerns before your next appointment please send [place] a message through [place] or call our office at [phone].    TO LEAVE A MESSAGE FOR THE NURSE SELECT OPTION 2, PLEASE LEAVE A MESSAGE INCLUDING: . YOUR NAME . DATE OF BIRTH . CALL BACK NUMBER . REASON FOR CALL**this is important as we prioritize the call backs  YOU WILL RECEIVE A CALL BACK THE SAME DAY AS LONG AS YOU CALL BEFORE 4:00 PM

## 2019-12-13 NOTE — Progress Notes (Signed)
Heart Failure TeleHealth Note  Due to national recommendations of social distancing due to COVID 19, Audio/video telehealth visit is felt to be most appropriate for this patient at this time.  See MyChart message from today for patient consent [facility].  Date:  12/13/2019   ID:  Waldron LabsJames B Goldwasser, DOB 02-03-1966, MRN [id number]  Location: Home  Provider location: [place] [facility] Type of Visit: Established patient   PCP:  Tally JoeSwayne, David, MD  Cardiologist:  Parke PoissonGayatri A Acharya, MD Primary HF: Dr. Gala RomneyBensimhon   Chief Complaint:  Chief Complaint  Patient presents with  . Follow-up   F/u for chronic diastolic heart failure, R>L    History of Present Illness: Waldron LabsJames B Arline is a 54 y.o. male with a history of chronic diastolic heart failure, pulmonary HTN, obesity, COPD and OSA.   He presents via Web designeraudio/video conferencing for a telehealth visit today.     he denies symptoms worrisome for COVID 19.   He was recently referred by Dr. Jacques NavyAcharya for further management of his chronic diastolic HF. Management of volume status has been challenging. Previous outpatient diuretic regimen = Bumex 2 mg bid. Has normal renal function. Echo 2/21 showed normal LVEF 60-65%, no LVH. RV normal. No significant valvular disease. RVSP estimate not outlined in study report. His most recent hospitalization was 06/2019 for a/c CHF, requiring treatment w/ IV diuretics. His discharge wt was 394 lb.   [facility] Was SOB w/ activity. Improvement w/ rest. Sedentary at baseline. Functional limitation multifactorial from morbid obesity, CHF, deconditioning and chronic back pain. He had reported steady wt gain, his wt had increased up to 434 lb. He reported daily compliance w/ Bumex but poor urinary response. I referred him to remote health for IV Lasix and he had good diuresis, over 32 lb. He was transitioned to PO torsemide w/ PRN  metolazone. He was set up for RHC w/ Dr. Gala RomneyBensimhon on 09/22/19 which demonstrated mild PAH, normal cardiac output and no evidence of intracardiac shunting. He was suspected to have primarily WHO group III PAH. Wt loss and treatment of OSA was recommended. He was referred back to pulmonology for repeat sleep study.   Today in f/u, he reports doing fairly well. Reports full compliance w/ torsemide w/ good urinary response. Wt has been stable. He is now being followed by pulmonology and is on daily supp O2, 6L/min baseline. No recent increased O2 requirements. He reports that he had initial sleep study that confirmed OSA. He is scheduled for f/u split night study for CPAP titration in mid September. His BP his mildly elevated today at 150/88 but he attributes this to increased chronic low back and bilateral leg pain. When his pain is better controlled, his BP is usually ~120/80.   Review of systems complete and found to be negative unless listed in HPI.     Past Medical History:  Diagnosis Date  . Arthritis   . Asthma    as child  . Back pain   . CHF (congestive heart failure) (HCC)   . COPD (chronic obstructive pulmonary disease) (HCC)   . GERD (gastroesophageal reflux disease)   . Hernia   . History of hiatal hernia   . Hypogonadism in male   . Neuromuscular disorder (HCC)    back injury with surgery  . PONV (postoperative nausea and vomiting)    gets nausea  . Shortness of breath dyspnea   .  Sleep apnea    Does not use CPAP, new equipment 12/2014  . Ulcer   . Wears glasses    Past Surgical History:  Procedure Laterality Date  . BACK SURGERY    . JOINT REPLACEMENT     Left knee replacement  . LAPAROSCOPIC GASTRIC SLEEVE RESECTION N/A 01/08/2015   Procedure: LAPAROSCOPIC GASTRIC SLEEVE RESECTION;  Surgeon: Luretha Murphy, MD;  Location: [facility];  Service: General;  Laterality: N/A;  . morphine pump    . RIGHT HEART CATH N/A 09/22/2019   Procedure: RIGHT HEART CATH;  Surgeon:  Dolores Patty, MD;  Location: [facility];  Service: Cardiovascular;  Laterality: N/A;  . SHOULDER ARTHROSCOPY WITH SUBACROMIAL DECOMPRESSION AND BICEP TENDON REPAIR Left 10/06/2013   Procedure: LEFT SHOULDER ARTHROSCOPY DEBRIDEMENT EXTENTSIVE,DISTAL CLAVICULECTOMY,DECOMPRESSION SUBACROMIAL PARTIAL ACROMIOPLASTY WITH ROTATOR CUFF REPAIR, and excision of CALCIUM DEPOSIT;  Surgeon: Loreta Ave, MD;  Location: [facility];  Service: Orthopedics;  Laterality: Left;  . SHOULDER ARTHROSCOPY WITH SUBACROMIAL DECOMPRESSION, ROTATOR CUFF REPAIR AND BICEP TENDON REPAIR Right 08/18/2013   Procedure: RIGHT SHOULDER ARTHROSCOPY WITH SUBACROMIAL DECOMPRESSION/PARTIAL ACROMIOPLASTY WITH CORACOACROMIAL RELEASE/DISTAL CLAVICULECTOMY/ ROTATOR CUFF REPAIR/DEBRIDEMENT EXTENTSIVE;  Surgeon: Loreta Ave, MD;  Location: [facility];  Service: Orthopedics;  Laterality: Right;  ANESTHESIA: GENERAL, PRE/POST OP SCALENE  . TONSILLECTOMY       Current Outpatient Medications  Medication Sig Dispense Refill  . acetaminophen (TYLENOL) 500 MG tablet Take 1,000 mg by mouth every 6 (six) hours as needed for mild pain or moderate pain.     Marland Kitchen aspirin EC 81 MG tablet Take 81 mg by mouth daily.    . calcium carbonate (CALCIUM 600) 600 MG TABS tablet Take 1 tablet (600 mg total) by mouth 2 (two) times daily with a meal. 180 tablet 3  . Cholecalciferol (VITAMIN D3) 125 MCG (5000 UT) CAPS Take 1 capsule (5,000 Units total) by mouth daily with breakfast. Take along with calcium and magnesium. (Patient taking differently: Take 5,000 Units by mouth daily with breakfast. Take along with calcium and magnesium.) 90 capsule 3  . cyclobenzaprine (FLEXERIL) 10 MG tablet Take 1 tablet (10 mg total) by mouth at bedtime. 90 tablet 1  . escitalopram (LEXAPRO) 20 MG tablet Take 10 mg by mouth daily.     Marland Kitchen esomeprazole (NEXIUM) 40 MG capsule Take 1 capsule (40 mg total) by mouth daily. (Patient taking  differently: Take 40 mg by mouth every evening. ) 90 capsule 3  . gabapentin (NEURONTIN) 600 MG tablet Take 600 mg by mouth 3 (three) times daily. Taking 4 times daily    . ibuprofen (ADVIL) 200 MG tablet Take 400 mg by mouth every 6 (six) hours as needed for mild pain or moderate pain.    . metolazone (ZAROXOLYN) 2.5 MG tablet Take 2.5 mg by mouth daily.     Melene Muller ON 02/06/2020] morphine (MS CONTIN) 30 MG 12 hr tablet Take 1 tablet (30 mg total) by mouth every 12 (twelve) hours. Must last 30 days. Do not break tablet 60 tablet 0  . NON FORMULARY 1,395.2 mcg by Intrathecal route daily. IT pump Fentanyl 2,000.0 mcg/ml Bupivicaine 20.0 mg/ml Baclofen 200.0 mcg/ml 24 dose 1395.2 mcg/day 40 ml pump    . potassium chloride (MICRO-K) 10 MEQ CR capsule Take 2 capsules (20 mEq total) by mouth daily. (Patient taking differently: Take 10 mEq by mouth daily. ) 180 capsule 3  . pregabalin (LYRICA) 150 MG capsule Take 1 capsule (150 mg total)  by mouth 3 (three) times daily. 90 capsule 5  . promethazine (PHENERGAN) 25 MG tablet Take 25-50 mg by mouth every 6 (six) hours as needed for nausea or vomiting.     . rosuvastatin (CRESTOR) 10 MG tablet Take 10 mg by mouth daily.    Marland Kitchen tiZANidine (ZANAFLEX) 4 MG tablet Take 1 tablet (4 mg total) by mouth every 8 (eight) hours as needed for muscle spasms. 90 tablet 5  . torsemide (DEMADEX) 20 MG tablet Take 2 tablets (40 mg total) by mouth daily. 180 tablet 3   No current facility-administered medications for this encounter.    Allergies:   Tape   Social History:  The patient  reports that he has never smoked. His smokeless tobacco use includes snuff and chew. He reports current alcohol use. He reports that he does not use drugs.   Family History:  The patient's family history includes Diabetes in his mother; Pneumonia in his father.   ROS:  Please see the history of present illness.   All other systems are personally reviewed and negative.   Exam:   (Video/Tele Health Call; Exam is subjective and or/visual.) General:  obese, on supp O2, Speaks in full sentences. No resp difficulty. Lungs: Normal respiratory effort with conversation.  Abdomen: obese, Non-distended per patient report Extremities: Pt denies edema. Neuro: Alert & oriented x 3.   Recent Labs: 06/11/2019: ALT 31 09/14/2019: B Natriuretic Peptide 72.6 09/20/2019: TSH 1.36 09/22/2019: BUN 9; Creatinine, Ser 0.74; Hemoglobin 15.3; Platelets 217; Potassium 3.8; Sodium 144  Personally reviewed   Wt Readings from Last 3 Encounters:  12/13/19 (!) 180.5 kg (398 lb)  12/08/19 (!) 176.9 kg (390 lb)  11/07/19 (!) 181.4 kg (400 lb)      ASSESSMENT AND PLAN:  1.  Chronic Diastolic Heart Failure/ Suspect Prominent RV Failure w/ Pulmonary HTN: - Echo 2/21 showed normal LVEF 60-65%, no LVH. RV ok. No significant valvular disease. RHC 6/21 demonstrated mild PAH, normal cardiac output and no evidence of intracardiac shunting.  Suspect combination WHO Group 3 from untreated OSA/ OHS, Group 2 (diastolic dysfunction) and possibly Group 4 (w/ sedentary lifestyle and chronic LEE ?DVT>>CTEPH).  - he reports respiratory status has been stable, on chronic home O2 w/ no recent change in baseline [place] requirements - his wt has been stable per his report and he reports good urinary response to torsemide - Continue torsemide 40 mg daily. We discussed importance of daily wts. Can continue PRN metolazone 2.5 mg if > 5 lb wt gain in 1 week (advised to take extra 40 mEq of KCl w/ metolazone) - recent sleep study confirmed OSA. Awaiting split night study for CPAP titration (strict adherence will be imperative in treatment of PH) - Pulmonology also has ordered PFTs (pending) - Recommend VQ scan to r/o CTEPH  - needs labs to monitor renal function and electrolytes, he is agreeable to come in for BMP   2. HTN - BP mildly elevated today 150/88. He attributes this to increased chronic low back and bilateral leg  pain. When his pain is better controlled, his BP is usually ~120/80.  - he was advised to monitor BP closely over the next 2 weeks - if persistently elevated, would recommend initiation of therapy   3. Obesity  Body mass index is 55.51 kg/m.  - wt loss challenging due to limited mobility  - sedentary lifestyle 2/2 chronic low back pain and chronic hypoxic respiratory failure  4. OSA - awaiting CPAP titration  -  pulmonology following   5. Chronic Hypoxic Respiratory Failure - multifactorial, 2/2 COPD, OHS/OSA, PH and Chronic Diastolic HF  - On chronic supp O2 via [place], 6L/min (stable)    COVID screen The patient does not have any symptoms that suggest any further testing/ screening at this time.  Social distancing reinforced today.  Patient Risk: After full review of this patients clinical status, I feel that they are at moderate risk for cardiac decompensation at this time.  Relevant cardiac medications were reviewed at length with the patient today. The patient does not have concerns regarding their medications at this time.   The following changes were made today: none  Recommended follow-up:  Ceck BMP  Return provider f/u in 3 months or sooner if needed   Today, I have spent 15 minutes with the patient with telehealth technology discussing the above issues .    Signed, Robbie Lis, PA-C  12/13/2019 3:00 PM  [facility] [address] Heart and Vascular [place] [address] [phone] (office) [phone] (fax)

## 2019-12-15 ENCOUNTER — Telehealth (HOSPITAL_COMMUNITY): Payer: Self-pay | Admitting: Vascular Surgery

## 2019-12-15 NOTE — Telephone Encounter (Signed)
Called pt to give VQ scan appt, pt refused VQ scan

## 2019-12-22 ENCOUNTER — Other Ambulatory Visit (HOSPITAL_COMMUNITY): Payer: Medicare Other

## 2019-12-26 ENCOUNTER — Other Ambulatory Visit: Payer: Self-pay

## 2019-12-26 MED ORDER — PAIN MANAGEMENT IT PUMP REFILL: 1.0000 | Freq: Once | INTRATHECAL | 0 refills | Status: AC

## 2019-12-29 ENCOUNTER — Ambulatory Visit (HOSPITAL_COMMUNITY)
Admission: RE | Admit: 2019-12-29 | Discharge: 2019-12-29 | Disposition: A | Discharge status: Home / Self Care | Payer: Medicare HMO | Source: Ambulatory Visit | Attending: Cardiology | Admitting: Cardiology

## 2019-12-29 ENCOUNTER — Other Ambulatory Visit: Payer: Self-pay

## 2019-12-29 DIAGNOSIS — I5032 Chronic diastolic (congestive) heart failure: Secondary | ICD-10-CM | POA: Insufficient documentation

## 2019-12-29 DIAGNOSIS — I272 Pulmonary hypertension, unspecified: Secondary | ICD-10-CM

## 2019-12-29 LAB — BASIC METABOLIC PANEL
Anion gap: 11 (ref 5–15)
BUN: 10 mg/dL (ref 6–20)
CO2: 29 mmol/L (ref 22–32)
Calcium: 9.9 mg/dL (ref 8.9–10.3)
Chloride: 99 mmol/L (ref 98–111)
Creatinine, Ser: 0.79 mg/dL (ref 0.61–1.24)
GFR calc Af Amer: >60 mL/min (ref >60)
GFR calc non Af Amer: >60 mL/min (ref >60)
Glucose, Bld: 111 mg/dL — ABNORMAL HIGH (ref 70–99)
Potassium: 4.2 mmol/L (ref 3.5–5.1)
Sodium: 139 mmol/L (ref 135–145)

## 2020-01-01 ENCOUNTER — Ambulatory Visit (HOSPITAL_BASED_OUTPATIENT_CLINIC_OR_DEPARTMENT_OTHER): Payer: Medicare HMO | Attending: Pulmonary Disease | Admitting: Internal Medicine

## 2020-01-01 ENCOUNTER — Other Ambulatory Visit: Payer: Self-pay

## 2020-01-01 DIAGNOSIS — Z6841 Body Mass Index (BMI) 40.0 and over, adult: Secondary | ICD-10-CM | POA: Insufficient documentation

## 2020-01-01 DIAGNOSIS — G4733 Obstructive sleep apnea (adult) (pediatric): Secondary | ICD-10-CM | POA: Insufficient documentation

## 2020-01-01 DIAGNOSIS — E669 Obesity, unspecified: Secondary | ICD-10-CM | POA: Diagnosis not present

## 2020-01-01 DIAGNOSIS — R0683 Snoring: Secondary | ICD-10-CM | POA: Insufficient documentation

## 2020-01-06 ENCOUNTER — Telehealth: Payer: Self-pay | Admitting: Pulmonary Disease

## 2020-01-06 DIAGNOSIS — G4733 Obstructive sleep apnea (adult) (pediatric): Secondary | ICD-10-CM | POA: Diagnosis not present

## 2020-01-06 NOTE — Procedures (Signed)
POLYSOMNOGRAPHY  Last, First: [name], Space MRN: [id number] Gender: Male Age (years): 54 Weight (lbs): 400 DOB: [birth year] BMI: 54 Primary Care: No PCP Epworth Score: 8 Referring: Tomma Lightning MD Technician: Lise Auer Interpreting: Tomma Lightning MD Study Type: CPAP Ordered Study Type: CPAP Study date: 01/01/2020 Location: [place] CLINICAL INFORMATION [name] is a 54 year old Male and was referred to the sleep center for evaluation of N/A. Indications include Obesity, OSA.  MEDICATIONS Patient self administered medications include: N/A. Medications administered during study include No sleep medicine administered.  SLEEP STUDY TECHNIQUE The patient underwent an attended overnight polysomnography titration to assess the effects of CPAP therapy. The following variables were monitored: EEG(C4-A1, C3-A2, O1-A2, O2-A1), EOG, submental and leg EMG, ECG, oxyhemoglobin saturation by pulse oximetry, thoracic and abdominal respiratory effort belts, nasal/oral airflow by pressure sensor, body position sensor and snoring sensor. CPAP pressure was titrated to eliminate apneas, hypopneas and oxygen desaturation. Hypopneas were scored per AASM definition IB (4% desaturation)  TECHNICIAN COMMENTS Comments added by Technician: Patient had difficulty maintaining sleep Comments added by Scorer: N/A SLEEP ARCHITECTURE The study was initiated at 11:00:16 PM and terminated at 5:29:08 AM. Total recorded time was 388.9 minutes. EEG confirmed total sleep time was 168 minutes yielding a sleep efficiency of 43.2%%. Sleep onset after lights out was 0.0 minutes with a REM latency of 0.0 minutes. The patient spent 3.0%% of the night in stage N1 sleep, 44.6%% in stage N2 sleep, 19.9%% in stage N3 and 32.4% in REM. The Arousal Index was 5.7/hour. RESPIRATORY PARAMETERS The overall AHI was 0.4 per hour, and the RDI was 0.7 events/hour with a central apnea index of 0.0per hour. The most  appropriate setting of CPAP was 8 cm H2O. At this setting, the sleep efficiency was 21% and the patient was supine for 100%. The AHI was 1.0 events per hour, and the RDI was 2.1 events/hour (with 0.0 central events) and the arousal index was 2.1 per hour.The oxygen nadir was 80.0% during sleep.    The cumulative time under 88% oxygen saturation was 5.5 minutes  LEG MOVEMENT DATA The total leg movements were 11 with a resulting leg movement index of 3.9/hr. Associated arousal with leg movement index was 0.4/hr. CARDIAC DATA The underlying cardiac rhythm was most consistent with sinus rhythm. Mean heart rate during sleep was 81.6 bpm. Additional rhythm abnormalities include None.  IMPRESSIONS - EKG showed no cardiac abnormalities. - The patient snored with loud snoring volume. - Severe Oxygen Desaturation - No Significant Obstructive Sleep apnea(OSA) Optimal pressure attained. - No Significant Central Sleep Apnea (CSA) - No Significant Upper Airway Resistance Syndrome(UARS). - No significant periodic leg movements(PLMs) during sleep. However, no significant associated arousals. Loraine Leriche reduced sleep efficiency, short primary sleep latency, no REM sleep latency and normal slow wave latency. DIAGNOSIS - Obstructive Sleep Apnea (G47.33)  RECOMMENDATIONS - Trial of CPAP therapy on 8 cm H2O with a Medium size Resmed Nasal Pillow Mask AirFit P10 mask and heated humidification. - Avoid alcohol, sedatives and other CNS depressants that may worsen sleep apnea and disrupt normal sleep architecture. - Sleep hygiene should be reviewed to assess factors that may improve sleep quality. - Sleep maintenance insomnia should be further evaluated - Weight management and regular exercise should be initiated or continued. - Return to Sleep Center for re-evaluation after 4 weeks of therapy  [Electronically signed] 01/06/2020 12:17 PM  Virl Diamond MD NPI: [id number]

## 2020-01-06 NOTE — Telephone Encounter (Signed)
Call patient  Sleep study result  Date of study: 01/01/2020  Impression: Obstructive sleep apnea adequately treated with CPAP therapy Sleep maintenance insomnia No significant oxygen desaturations at optimal pressures  Recommendation: CPAP therapy at 8 cm H2O with a medium size ResMed nasal pillow mask AirFit P 10 mask and heated humidification  Optimize factors that may promote sleep  Regular exercise and weight loss encouraged  Follow-up 4 to 6 weeks following initiation of treatment

## 2020-01-07 DIAGNOSIS — I5032 Chronic diastolic (congestive) heart failure: Secondary | ICD-10-CM | POA: Diagnosis not present

## 2020-01-10 NOTE — Telephone Encounter (Signed)
Contacted patient with results of home sleep study. Dr. Trena Platt recommendations reviewed. Patient does not want to restart CPAP therapy at this time. He will contact our office if he changes his mind.

## 2020-01-23 NOTE — Progress Notes (Signed)
PROVIDER NOTE: Information contained herein reflects review and annotations entered in association with encounter. Interpretation of such information and data should be left to medically-trained personnel. Information provided to patient can be located elsewhere in the medical record under "Patient Instructions". Document created using STT-dictation technology, any transcriptional errors that may result from process are unintentional.    Patient: [name]  Service Category: Procedure  Provider: Gaspar Cola, MD  DOB: Aug 19, 1965  DOS: 01/24/2020  Location: [facility]  MRN: [id number]  Setting: Ambulatory - outpatient  Referring Provider: Antony Contras, MD  Type: Established Patient  Specialty: Interventional Pain Management  PCP: Antony Contras, MD   Primary Reason for Visit: Interventional Pain Management Treatment. CC: Back Pain (lumbar bilateral )  Procedure:          Intrathecal Drug Delivery System (IDDS):  Type: Reservoir Refill [phone]) No rate change Region: Abdominal Laterality: Left  Type of Pump: Medtronic Synchromed II Delivery Route: Intrathecal Type of Pain Treated: Neuropathic/Nociceptive Primary Medication Class: Opioid/opiate  Medication, Concentration, Infusion Program, & Delivery Rate: Please see scanned programming printout.   Indications: 1. Chronic pain syndrome   2. Chronic low back pain (Primary Area of Pain) (Bilateral) (R>L)   3. Chronic lower extremity pain (Secondary area of Pain) (Bilateral) (L>R)   4. DDD (degenerative disc disease), lumbosacral   5. Failed back surgical syndrome   6. Pharmacologic therapy   7. Presence of intrathecal pump   8. Encounter for adjustment or management of infusion pump   9. Long term prescription benzodiazepine use   10. Long term prescription opiate use   11. Gastroesophageal reflux disease without esophagitis   12. Vitamin D deficiency    Pain Assessment: Self-Reported Pain Score: 6 /10              Reported level is compatible with observation.         Both, the patient and his wife are frustrated with his persistent pain.  However, when I asked him about the MS Contin, he indicated that it initially helped him but very quickly it stopped doing so.  This confirms my fears that this patient has a problem with developing tolerance to opioids extremely fast.  Of course, I did not start him on any opioids or on the intrathecal pump and knowing the patient now, I am not sure I would have gone that way since he keeps developing tolerance extremely fast to any medication dose that I give him.  I did have him do a drug holiday sometime ago, and he claims that he follow my instructions and stopped all narcotics for at least 2 weeks.  This should have completely Keli her to his tolerance to those opioids, but it does not seem to have done so, which makes me suspicious as to how diligent he was with being compliant with my orders to stay away from the opioids.  In any case, he represents a big challenge in terms of his management.  I know that he has lumbar facet disease and this is the primary cause of his problems.  However, I have done 1 diagnostic bilateral lumbar facet block and schedule him to return for a second 1 but he never kept that appointment.  His wife indicates that no matter what she does, he does not lose the weight.  He has already undergone bariatric surgery and although he lost some weight initially, he quickly regained it.  I asked him if they had followed up  with the nutritionist and they indicated that they have not seen any yet.  However, when I reviewed my records I see that in February 2020 I made a referral to the nutritionist and apparently they canceled it.  Today I have sent another referral since they clearly need to have somebody review his nutrition and see if they can make some recommendations and changes.  He is morbidly obese with a BMI of 55.51 kg/m and this is affecting his  ability to breathe, already has congestive heart failure, and because of this he is also accumulating a lot of fluid.  To me there is no question that his morbid obesity is killing him.  Pharmacotherapy Assessment  Analgesic: Oxycodone IR 10 mg, 1 tab PO q 6 hrs (40 mg/day of oxycodone)+ 52.9 mcg/hr intrathecal PF-Fentanyl (1,268.8 mcg/day of Fentanyl) MME/day:41m/day(from oral medication)+ 126.96 mg/day (from intrathecal fentanyl) = 186.96MME/day(Aprox. 1.08MME/day/kg).   Monitoring: [place] PMP: PDMP reviewed during this encounter.       Pharmacotherapy: No side-effects or adverse reactions reported. Compliance: No problems identified. Effectiveness: Clinically acceptable. Plan: Refer to "POC".  UDS:  Summary  Date Value Ref Range Status  10/24/2019 Note  Final    Comment:    ==================================================================== [employer] Select 13 (MW) ==================================================================== Test                             Result       Flag       Units  Drug Present and Declared for Prescription Verification   Fentanyl                       40           EXPECTED   ng/mg creat   Norfentanyl                    56           EXPECTED   ng/mg creat    Source of fentanyl is a scheduled prescription medication, including    IV, patch, and transmucosal formulations. Norfentanyl is an expected    metabolite of fentanyl.  Drug Absent but Declared for Prescription Verification   Oxycodone                      Not Detected UNEXPECTED ng/mg creat ==================================================================== Test                      Result    Flag   Units      Ref Range   Creatinine              91               mg/dL      >=20 ==================================================================== Declared Medications:  The flagging and interpretation on this report are based on the  following declared medications.  Unexpected results  may arise from  inaccuracies in the declared medications.   **Note: The testing scope of this panel includes these medications:   Fentanyl  Oxycodone   **Note: The testing scope of this panel does not include the  following reported medications:   Acetaminophen (Tylenol)  Aspirin  Baclofen  Bupivacaine  Calcium  Cyclobenzaprine (Flexeril)  Escitalopram (Lexapro)  Esomeprazole (Nexium)  Ibuprofen (Advil)  Lubiprostone (Amitiza)  Metolazone (Zaroxolyn)  Potassium  Pregabalin (Lyrica)  Promethazine (Phenergan)  Rosuvastatin (Crestor)  Tizanidine (Zanaflex)  Torsemide (Demadex)  Vitamin D3 ==================================================================== For clinical consultation, please call [phone]. ====================================================================     Intrathecal Pump Therapy Assessment  Manufacturer: Medtronic Synchromed Type: Programmable Volume: 40 mL reservoir MRI compatibility: Yes   Drug content:  Primary Medication Class:Opioid Primary Medication:PF-Fentanyl(2,000.0 mcg/ml) Secondary Medication:PF-Bupivacaine(20.42m/mL) Other Medication:PF-Baclofen (200 mcg/mL)added to the pump on 05/26/2019   Programming:  Type: Simple continuous. See pump readout for details.   Changes:  Medication Change: None at this point Rate Change: No change in rate  Reported side-effects or adverse reactions: None reported  Effectiveness: Described as relatively effective, allowing for increase in activities of daily living (ADL) Clinically meaningful improvement in function (CMIF): Sustained CMIF goals met  Plan: Pump refill today  Pre-op Assessment:  [name] a 54y.o. (year old), male patient, seen today for interventional treatment. He  has a past surgical history that includes Back surgery; morphine pump; Tonsillectomy; Joint replacement; Shoulder arthroscopy with subacromial decompression, rotator cuff repair and bicep  tendon repair (Right, 08/18/2013); Shoulder arthroscopy with subacromial decompression and bicep tendon repair (Left, 10/06/2013); Laparoscopic gastric sleeve resection (N/A, 01/08/2015); and RIGHT HEART CATH (N/A, 09/22/2019). [name] a current medication list which includes the following prescription(s): acetaminophen, aspirin ec, cyclobenzaprine, escitalopram, gabapentin, ibuprofen, metolazone, [START ON 02/06/2020] morphine, [START ON 03/07/2020] morphine, [START ON 04/06/2020] morphine, [START ON 05/06/2020] morphine, NON FORMULARY, potassium chloride, pregabalin, promethazine, rosuvastatin, tizanidine, torsemide, calcium carbonate, vitamin d3, and esomeprazole. His primarily concern today is the Back Pain (lumbar bilateral )  Initial Vital Signs:  Pulse/HCG Rate: 91  Temp: 99.3 F (37.4 C) Resp: 16 BP: 134/88 SpO2: 100 %  BMI: Estimated body mass index is 55.51 kg/m as calculated from the following:   Height as of this encounter: 5' 11" (1.803 m).   Weight as of this encounter: 398 lb (180.5 kg).  Risk Assessment: Allergies: Reviewed. He is allergic to tape.  Allergy Precautions: None required Coagulopathies: Reviewed. None identified.  Blood-thinner therapy: None at this time Active Infection(s): Reviewed. None identified. [name]  Site Confirmation: [name] asked to confirm the procedure and laterality before marking the site Procedure checklist: Completed Consent: Before the procedure and under the influence of no sedative(s), amnesic(s), or anxiolytics, the patient was informed of the treatment options, risks and possible complications. To fulfill our ethical and legal obligations, as recommended by the American Medical Association's Code of Ethics, I have informed the patient of my clinical impression; the nature and purpose of the treatment or procedure; the risks, benefits, and possible complications of the intervention; the alternatives, including doing  nothing; the risk(s) and benefit(s) of the alternative treatment(s) or procedure(s); and the risk(s) and benefit(s) of doing nothing.  [name] provided with information about the general risks and possible complications associated with most interventional procedures. These include, but are not limited to: failure to achieve desired goals, infection, bleeding, organ or nerve damage, allergic reactions, paralysis, and/or death.  In addition, he was informed of those risks and possible complications associated to this particular procedure, which include, but are not limited to: damage to the implant; failure to decrease pain; local, systemic, or serious CNS infections, intraspinal abscess with possible cord compression and paralysis, or life-threatening such as meningitis; bleeding; organ damage; nerve injury or damage with subsequent sensory, motor, and/or autonomic system dysfunction, resulting in transient or permanent pain, numbness, and/or weakness of one or several areas of the body; allergic reactions, either minor or major life-threatening, such as anaphylactic or  anaphylactoid reactions.  Furthermore, [name] was informed of those risks and complications associated with the medications. These include, but are not limited to: allergic reactions (i.e.: anaphylactic or anaphylactoid reactions); endorphine suppression; bradycardia and/or hypotension; water retention and/or peripheral vascular relaxation leading to lower extremity edema and possible stasis ulcers; respiratory depression and/or shortness of breath; decreased metabolic rate leading to weight gain; swelling or edema; medication-induced neural toxicity; particulate matter embolism and blood vessel occlusion with resultant organ, and/or nervous system infarction; and/or intrathecal granuloma formation with possible spinal cord compression and permanent paralysis.  Before refilling the pump [name] was informed that some of the  medications used in the devise may not be FDA approved for such use and therefore it constitutes an off-label use of the medications.  Finally, he was informed that Medicine is not an exact science; therefore, there is also the possibility of unforeseen or unpredictable risks and/or possible complications that may result in a catastrophic outcome. The patient indicated having understood very clearly. We have given the patient no guarantees and we have made no promises. Enough time was given to the patient to ask questions, all of which were answered to the patient's satisfaction. [name] has indicated that he wanted to continue with the procedure. Attestation: I, the ordering provider, attest that I have discussed with the patient the benefits, risks, side-effects, alternatives, likelihood of achieving goals, and potential problems during recovery for the procedure that I have provided informed consent. Date  Time: 01/24/2020  1:31 PM  Pre-Procedure Preparation:  Monitoring: As per clinic protocol. Respiration, ETCO2, SpO2, BP, heart rate and rhythm monitor placed and checked for adequate function Safety Precautions: Patient was assessed for positional comfort and pressure points before starting the procedure. Time-out: I initiated and conducted the "Time-out" before starting the procedure, as per protocol. The patient was asked to participate by confirming the accuracy of the "Time Out" information. Verification of the correct person, site, and procedure were performed and confirmed by me, the nursing staff, and the patient. "Time-out" conducted as per Joint Commission's Universal Protocol (UP.01.01.01). Time: 1341  Description of Procedure:          Position: Supine Target Area: Central-port of intrathecal pump. Approach: Anterior, 90 degree angle approach. Area Prepped: Entire Area around the pump implant. DuraPrep (Iodine Povacrylex [0.7% available iodine] and Isopropyl Alcohol, 74%  w/w) Safety Precautions: Aspiration looking for blood return was conducted prior to all injections. At no point did we inject any substances, as a needle was being advanced. No attempts were made at seeking any paresthesias. Safe injection practices and needle disposal techniques used. Medications properly checked for expiration dates. SDV (single dose vial) medications used. Description of the Procedure: Protocol guidelines were followed. Two nurses trained to do implant refills were present during the entire procedure. The refill medication was checked by both healthcare providers as well as the patient. The patient was included in the "Time-out" to verify the medication. The patient was placed in position. The pump was identified. The area was prepped in the usual manner. The sterile template was positioned over the pump, making sure the side-port location matched that of the pump. Both, the pump and the template were held for stability. The needle provided in the Medtronic Kit was then introduced thru the center of the template and into the central port. The pump content was aspirated and discarded volume documented. The new medication was slowly infused into the pump, thru the filter, making sure to avoid overpressure  of the device. The needle was then removed and the area cleansed, making sure to leave some of the prepping solution back to take advantage of its long term bactericidal properties. The pump was interrogated and programmed to reflect the correct medication, volume, and dosage. The program was printed and taken to the physician for approval. Once checked and signed by the physician, a copy was provided to the patient and another scanned into the EMR. Vitals:   01/24/20 1321  BP: 134/88  Pulse: 91  Resp: 16  Temp: 99.3 F (37.4 C)  TempSrc: Temporal  SpO2: 100%  Weight: (!) 398 lb (180.5 kg)  Height: 5' 11" (1.803 m)    Start Time: 1341 hrs. End Time: 1354 hrs. Materials &  Medications: Medtronic Refill Kit Medication(s): Please see chart orders for details.  Imaging Guidance:          Type of Imaging Technique: None used Indication(s): N/A Exposure Time: No patient exposure Contrast: None used. Fluoroscopic Guidance: N/A Ultrasound Guidance: N/A Interpretation: N/A  Antibiotic Prophylaxis:   Anti-infectives (From admission, onward)   None     Indication(s): None identified  Post-operative Assessment:  Post-procedure Vital Signs:  Pulse/HCG Rate: 91  Temp: 99.3 F (37.4 C) Resp: 16 BP: 134/88 SpO2: 100 %  EBL: None  Complications: No immediate post-treatment complications observed by team, or reported by patient.  Note: The patient tolerated the entire procedure well. A repeat set of vitals were taken after the procedure and the patient was kept under observation following institutional policy, for this type of procedure. Post-procedural neurological assessment was performed, showing return to baseline, prior to discharge. The patient was provided with post-procedure discharge instructions, including a section on how to identify potential problems. Should any problems arise concerning this procedure, the patient was given instructions to immediately contact us, at any time, without hesitation. In any case, we plan to contact the patient by telephone for a follow-up status report regarding this interventional procedure.  Comments:  No additional relevant information.  Plan of Care  Orders:  Orders Placed This Encounter  Procedures  . PUMP REFILL    Maintain Protocol by having two(2) healthcare providers during procedure and programming.    Scheduling Instructions:     Please refill intrathecal pump today.    Order Specific Question:   Where will this procedure be performed?    Answer:   [facility]  . PUMP REFILL    Whenever possible schedule on a procedure today.    Standing Status:   Future    Standing Expiration Date:    06/22/2020    Scheduling Instructions:     Please schedule intrathecal pump refill based on pump programming. Avoid schedule intervals of more than 120 days (4 months).    Order Specific Question:   Where will this procedure be performed?    Answer:   [facility]  . LUMBAR FACET(MEDIAL BRANCH NERVE BLOCK) MBNB    Standing Status:   Future    Standing Expiration Date:   02/24/2020    Scheduling Instructions:     Procedure: Lumbar facet block (AKA.: Lumbosacral medial branch nerve block)     Side: Bilateral     Level: L3-4, L4-5, & L5-S1 Facets (L2, L3, L4, L5, & S1 Medial Branch Nerves)     Sedation: Patient's choice.     Timeframe: ASAA    Order Specific Question:   Where will this procedure be performed?    Answer:   [facility]  [facility] Management  . Amb Ref to Medical Weight Management    Referral Priority:   Routine    Referral Type:   Consultation    Referral Reason:   Specialty Services Required    Number of Visits Requested:   1  . Informed Consent Details: Physician/Practitioner Attestation; Transcribe to consent form and obtain patient signature    Transcribe to consent form and obtain patient signature.    Order Specific Question:   Physician/Practitioner attestation of informed consent for procedure/surgical case    Answer:   I, the physician/practitioner, attest that I have discussed with the patient the benefits, risks, side effects, alternatives, likelihood of achieving goals and potential problems during recovery for the procedure that I have provided informed consent.    Order Specific Question:   Procedure    Answer:   Intrathecal pump refill    Order Specific Question:   Physician/Practitioner performing the procedure    Answer:   Attending Physician: Kathlen Brunswick. Dossie Arbour, MD & designated trained staff    Order Specific Question:   Indication/Reason    Answer:   Chronic Pain Syndrome (G89.4), presence of an intrathecal pump (Z97.8)   Chronic Opioid Analgesic:  Oxycodone  IR 10 mg, 1 tab PO q 6 hrs (40 mg/day of oxycodone)+ 52.9 mcg/hr intrathecal PF-Fentanyl (1,268.8 mcg/day of Fentanyl) MME/day:17m/day(from oral medication)+ 126.96 mg/day (from intrathecal fentanyl) = 186.96MME/day(Aprox. 1.08MME/day/kg).   Medications ordered for procedure: Meds ordered this encounter  Medications  . morphine (MS CONTIN) 30 MG 12 hr tablet    Sig: Take 1 tablet (30 mg total) by mouth every 12 (twelve) hours. Must last 30 days. Do not break tablet    Dispense:  60 tablet    Refill:  0    Chronic Pain: STOP Act (Not applicable) Fill 1 day early if closed on refill date. Avoid benzodiazepines within 8 hours of opioids  . morphine (MS CONTIN) 30 MG 12 hr tablet    Sig: Take 1 tablet (30 mg total) by mouth every 12 (twelve) hours. Must last 30 days. Do not break tablet    Dispense:  60 tablet    Refill:  0    Chronic Pain: STOP Act (Not applicable) Fill 1 day early if closed on refill date. Avoid benzodiazepines within 8 hours of opioids  . morphine (MS CONTIN) 30 MG 12 hr tablet    Sig: Take 1 tablet (30 mg total) by mouth every 12 (twelve) hours. Must last 30 days. Do not break tablet    Dispense:  60 tablet    Refill:  0    Chronic Pain: STOP Act (Not applicable) Fill 1 day early if closed on refill date. Avoid benzodiazepines within 8 hours of opioids   Medications administered: [name] had no medications administered during this visit.  See the medical record for exact dosing, route, and time of administration.  Follow-up plan:   Return for Procedure (w/ sedation): (B) L-FCT BLK #2.       Interventional management options: Considering: NOTE: NO RFA until BMI <35  Palliative/therapeuticintrathecal pump refill and maintenance Diagnostic left Genicular NB Possible left Genicular nerve RFA Possible bilateral lumbar facet RFA(NO Lumbar RFA until BMI <35) Diagnostic caudal ESI+epidurogram Possible Racz procedure Diagnostic bilateral IA  shoulder joint injection Diagnostic bilateral suprascapular NB Possible bilateral suprascapular nerve RFA Diagnostic LESI vs TFESI   Palliative PRN treatment(s): Palliative/therapeuticintrathecal pump refill and maintenance Diagnostic bilateral lumbar facet block #2    Recent Visits Date Type Provider  Dept  12/08/19 Procedure visit Milinda Pointer, MD [facility]  Showing recent visits within past 90 days and meeting all other requirements Today's Visits Date Type Provider Dept  01/24/20 Procedure visit Milinda Pointer, MD [facility]  Showing today's visits and meeting all other requirements Future Appointments Date Type Provider Dept  03/15/20 Appointment Milinda Pointer, MD [facility]  Showing future appointments within next 90 days and meeting all other requirements  Disposition: Discharge home  Discharge (Date  Time): 01/24/2020; 1405 hrs.   Primary Care Physician: Antony Contras, MD Location: [facility] Note by: Gaspar Cola, MD Date: 01/24/2020; Time: 2:43 PM  Disclaimer:  Medicine is not an Chief Strategy Officer. The only guarantee in medicine is that nothing is guaranteed. It is important to note that the decision to proceed with this intervention was based on the information collected from the patient. The Data and conclusions were drawn from the patient's questionnaire, the interview, and the physical examination. Because the information was provided in large part by the patient, it cannot be guaranteed that it has not been purposely or unconsciously manipulated. Every effort has been made to obtain as much relevant data as possible for this evaluation. It is important to note that the conclusions that lead to this procedure are derived in large part from the available data. Always take into account that the treatment will also be dependent on availability of resources and existing treatment  guidelines, considered by other Pain Management Practitioners as being common knowledge and practice, at the time of the intervention. For Medico-Legal purposes, it is also important to point out that variation in procedural techniques and pharmacological choices are the acceptable norm. The indications, contraindications, technique, and results of the above procedure should only be interpreted and judged by a Board-Certified Interventional Pain Specialist with extensive familiarity and expertise in the same exact procedure and technique.

## 2020-01-24 ENCOUNTER — Other Ambulatory Visit: Payer: Self-pay

## 2020-01-24 ENCOUNTER — Encounter: Payer: Self-pay | Admitting: Pain Medicine

## 2020-01-24 ENCOUNTER — Ambulatory Visit: Payer: No Typology Code available for payment source | Attending: Pain Medicine | Admitting: Pain Medicine

## 2020-01-24 VITALS — BP 134/88 | HR 91 | Temp 99.3°F | Resp 16 | Ht 71.0 in | Wt 398.0 lb

## 2020-01-24 DIAGNOSIS — I5032 Chronic diastolic (congestive) heart failure: Secondary | ICD-10-CM | POA: Diagnosis present

## 2020-01-24 DIAGNOSIS — M961 Postlaminectomy syndrome, not elsewhere classified: Secondary | ICD-10-CM

## 2020-01-24 DIAGNOSIS — Z79891 Long term (current) use of opiate analgesic: Secondary | ICD-10-CM | POA: Diagnosis present

## 2020-01-24 DIAGNOSIS — Z6841 Body Mass Index (BMI) 40.0 and over, adult: Secondary | ICD-10-CM | POA: Insufficient documentation

## 2020-01-24 DIAGNOSIS — Z978 Presence of other specified devices: Secondary | ICD-10-CM | POA: Insufficient documentation

## 2020-01-24 DIAGNOSIS — M79604 Pain in right leg: Secondary | ICD-10-CM | POA: Insufficient documentation

## 2020-01-24 DIAGNOSIS — Z79899 Other long term (current) drug therapy: Secondary | ICD-10-CM

## 2020-01-24 DIAGNOSIS — M79605 Pain in left leg: Secondary | ICD-10-CM | POA: Diagnosis present

## 2020-01-24 DIAGNOSIS — G8929 Other chronic pain: Secondary | ICD-10-CM | POA: Diagnosis present

## 2020-01-24 DIAGNOSIS — M5137 Other intervertebral disc degeneration, lumbosacral region: Secondary | ICD-10-CM | POA: Diagnosis present

## 2020-01-24 DIAGNOSIS — M545 Low back pain, unspecified: Secondary | ICD-10-CM | POA: Diagnosis present

## 2020-01-24 DIAGNOSIS — J984 Other disorders of lung: Secondary | ICD-10-CM | POA: Diagnosis present

## 2020-01-24 DIAGNOSIS — G894 Chronic pain syndrome: Secondary | ICD-10-CM | POA: Insufficient documentation

## 2020-01-24 DIAGNOSIS — E559 Vitamin D deficiency, unspecified: Secondary | ICD-10-CM | POA: Diagnosis present

## 2020-01-24 DIAGNOSIS — Z9884 Bariatric surgery status: Secondary | ICD-10-CM

## 2020-01-24 DIAGNOSIS — Z451 Encounter for adjustment and management of infusion pump: Secondary | ICD-10-CM | POA: Diagnosis present

## 2020-01-24 DIAGNOSIS — K219 Gastro-esophageal reflux disease without esophagitis: Secondary | ICD-10-CM

## 2020-01-24 MED ORDER — MORPHINE SULFATE ER 30 MG PO TBCR: 30.0000 mg | EXTENDED_RELEASE_TABLET | Freq: Two times a day (BID) | ORAL | 0 refills | Status: DC

## 2020-01-24 NOTE — Patient Instructions (Signed)
____________________________________________________________________________________________  Medication Rules  Purpose: To inform patients, and their family members, of our rules and regulations.  Applies to: All patients receiving prescriptions (written or electronic).  Pharmacy of record: Pharmacy where electronic prescriptions will be sent. If written prescriptions are taken to a different pharmacy, please inform the nursing staff. The pharmacy listed in the electronic medical record should be the one where you would like electronic prescriptions to be sent.  Electronic prescriptions: In compliance with the [place] Strengthen Opioid Misuse Prevention (STOP) Act of 2017 (Session Law 2017-74/H243), effective April 14, 2018, all controlled substances must be electronically prescribed. Calling prescriptions to the pharmacy will cease to exist.  Prescription refills: Only during scheduled appointments. Applies to all prescriptions.  NOTE: The following applies primarily to controlled substances (Opioid* Pain Medications).   Type of encounter (visit): For patients receiving controlled substances, face-to-face visits are required. (Not an option or up to the patient.)  Patient's responsibilities: 1. Pain Pills: Bring all pain pills to every appointment (except for procedure appointments). 2. Pill Bottles: Bring pills in original pharmacy bottle. Always bring the newest bottle. Bring bottle, even if empty. 3. Medication refills: You are responsible for knowing and keeping track of what medications you take and those you need refilled. The day before your appointment: write a list of all prescriptions that need to be refilled. The day of the appointment: give the list to the admitting nurse. Prescriptions will be written only during appointments. No prescriptions will be written on procedure days. If you forget a medication: it will not be "Called in", "Faxed", or "electronically sent".  You will need to get another appointment to get these prescribed. No early refills. Do not call asking to have your prescription filled early. 4. Prescription Accuracy: You are responsible for carefully inspecting your prescriptions before leaving our office. Have the discharge nurse carefully go over each prescription with you, before taking them home. Make sure that your name is accurately spelled, that your address is correct. Check the name and dose of your medication to make sure it is accurate. Check the number of pills, and the written instructions to make sure they are clear and accurate. Make sure that you are given enough medication to last until your next medication refill appointment. 5. Taking Medication: Take medication as prescribed. When it comes to controlled substances, taking less pills or less frequently than prescribed is permitted and encouraged. Never take more pills than instructed. Never take medication more frequently than prescribed.  6. Inform other Doctors: Always inform, all of your healthcare providers, of all the medications you take. 7. Pain Medication from other Providers: You are not allowed to accept any additional pain medication from any other Doctor or Healthcare provider. There are two exceptions to this rule. (see below) In the event that you require additional pain medication, you are responsible for notifying us, as stated below. 8. Medication Agreement: You are responsible for carefully reading and following our Medication Agreement. This must be signed before receiving any prescriptions from our practice. Safely store a copy of your signed Agreement. Violations to the Agreement will result in no further prescriptions. (Additional copies of our Medication Agreement are available upon request.) 9. Laws, Rules, & Regulations: All patients are expected to follow all Federal and State Laws, Statutes, Rules, & Regulations. Ignorance of the Laws does not constitute a  valid excuse.  10. Illegal drugs and Controlled Substances: The use of illegal substances (including, but not limited to marijuana and its   derivatives) and/or the illegal use of any controlled substances is strictly prohibited. Violation of this rule may result in the immediate and permanent discontinuation of any and all prescriptions being written by our practice. The use of any illegal substances is prohibited. 11. Adopted CDC guidelines & recommendations: Target dosing levels will be at or below 60 MME/day. Use of benzodiazepines** is not recommended.  Exceptions: There are only two exceptions to the rule of not receiving pain medications from other Healthcare Providers. 1. Exception #1 (Emergencies): In the event of an emergency (i.e.: accident requiring emergency care), you are allowed to receive additional pain medication. However, you are responsible for: As soon as you are able, call our office [phone], at any time of the day or night, and leave a message stating your name, the date and nature of the emergency, and the name and dose of the medication prescribed. In the event that your call is answered by a member of our staff, make sure to document and save the date, time, and the name of the person that took your information.  2. Exception #2 (Planned Surgery): In the event that you are scheduled by another doctor or dentist to have any type of surgery or procedure, you are allowed (for a period no longer than 30 days), to receive additional pain medication, for the acute post-op pain. However, in this case, you are responsible for picking up a copy of our "Post-op Pain Management for Surgeons" handout, and giving it to your surgeon or dentist. This document is available at our office, and does not require an appointment to obtain it. Simply go to our office during business hours (Monday-Thursday from 8:00 AM to 4:00 PM) (Friday 8:00 AM to 12:00 Noon) or if you have a scheduled appointment  with us, prior to your surgery, and ask for it by name. In addition, you are responsible for: calling our office [phone], at any time of the day or night, and leaving a message stating your name, name of your surgeon, type of surgery, and date of procedure or surgery. Failure to comply with your responsibilities may result in termination of therapy involving the controlled substances.  *Opioid medications include: morphine, codeine, oxycodone, oxymorphone, hydrocodone, hydromorphone, meperidine, tramadol, tapentadol, buprenorphine, fentanyl, methadone. **Benzodiazepine medications include: diazepam (Valium), alprazolam (Xanax), clonazepam (Klonopine), lorazepam (Ativan), clorazepate (Tranxene), chlordiazepoxide (Librium), estazolam (Prosom), oxazepam (Serax), temazepam (Restoril), triazolam (Halcion) (Last updated: 12/20/2019) ____________________________________________________________________________________________   ____________________________________________________________________________________________  Medication Recommendations and Reminders  Applies to: All patients receiving prescriptions (written and/or electronic).  Medication Rules & Regulations: These rules and regulations exist for your safety and that of others. They are not flexible and neither are we. Dismissing or ignoring them will be considered "non-compliance" with medication therapy, resulting in complete and irreversible termination of such therapy. (See document titled "Medication Rules" for more details.) In all conscience, because of safety reasons, we cannot continue providing a therapy where the patient does not follow instructions.  Pharmacy of record:   Definition: This is the pharmacy where your electronic prescriptions will be sent.   We do not endorse any particular pharmacy, however, we have experienced problems with [facility] not securing enough medication supply for the community.  We do not  restrict you in your choice of pharmacy. However, once we write for your prescriptions, we will NOT be re-sending more prescriptions to fix restricted supply problems created by your pharmacy, or your insurance.   The pharmacy listed in the electronic medical record should be the   one where you want electronic prescriptions to be sent.  If you choose to change pharmacy, simply notify our nursing staff.  Recommendations:  Keep all of your pain medications in a safe place, under lock and key, even if you live alone. We will NOT replace lost, stolen, or damaged medication.  After you fill your prescription, take 1 week's worth of pills and put them away in a safe place. You should keep a separate, properly labeled bottle for this purpose. The remainder should be kept in the original bottle. Use this as your primary supply, until it runs out. Once it's gone, then you know that you have 1 week's worth of medicine, and it is time to come in for a prescription refill. If you do this correctly, it is unlikely that you will ever run out of medicine.  To make sure that the above recommendation works, it is very important that you make sure your medication refill appointments are scheduled at least 1 week before you run out of medicine. To do this in an effective manner, make sure that you do not leave the office without scheduling your next medication management appointment. Always ask the nursing staff to show you in your prescription , when your medication will be running out. Then arrange for the receptionist to get you a return appointment, at least 7 days before you run out of medicine. Do not wait until you have 1 or 2 pills left, to come in. This is very poor planning and does not take into consideration that we may need to cancel appointments due to bad weather, sickness, or emergencies affecting our staff.  DO NOT ACCEPT A "Partial Fill": If for any reason your pharmacy does not have enough pills/tablets  to completely fill or refill your prescription, do not allow for a "partial fill". The law allows the pharmacy to complete that prescription within 72 hours, without requiring a new prescription. If they do not fill the rest of your prescription within those 72 hours, you will need a separate prescription to fill the remaining amount, which we will NOT provide. If the reason for the partial fill is your insurance, you will need to talk to the pharmacist about payment alternatives for the remaining tablets, but again, DO NOT ACCEPT A PARTIAL FILL, unless you can trust your pharmacist to obtain the remainder of the pills within 72 hours.  Prescription refills and/or changes in medication(s):   Prescription refills, and/or changes in dose or medication, will be conducted only during scheduled medication management appointments. (Applies to both, written and electronic prescriptions.)  No refills on procedure days. No medication will be changed or started on procedure days. No changes, adjustments, and/or refills will be conducted on a procedure day. Doing so will interfere with the diagnostic portion of the procedure.  No phone refills. No medications will be "called into the pharmacy".  No Fax refills.  No weekend refills.  No Holliday refills.  No after hours refills.  Remember:  Business hours are:  Monday to Thursday 8:00 AM to 4:00 PM Provider's Schedule: Summers Buendia, MD - Appointments are:  Medication management: Monday and Wednesday 8:00 AM to 4:00 PM Procedure day: Tuesday and Thursday 7:30 AM to 4:00 PM Bilal Lateef, MD - Appointments are:  Medication management: Tuesday and Thursday 8:00 AM to 4:00 PM Procedure day: Monday and Wednesday 7:30 AM to 4:00 PM (Last update: 11/02/2019) ____________________________________________________________________________________________   ____________________________________________________________________________________________  CBD  (cannabidiol) WARNING  Applicable to: All individuals currently   taking or considering taking CBD (cannabidiol) and, more important, all patients taking opioid analgesic controlled substances (pain medication). (Example: oxycodone; oxymorphone; hydrocodone; hydromorphone; morphine; methadone; tramadol; tapentadol; fentanyl; buprenorphine; butorphanol; dextromethorphan; meperidine; codeine; etc.)  Legal status: CBD remains a Schedule I drug prohibited for any use. CBD is illegal with one exception. In the [place], CBD has a limited Food and Drug Administration (FDA) approval for the treatment of two specific types of epilepsy disorders. Only one CBD product has been approved by the FDA for this purpose: "Epidiolex". FDA is aware that some companies are marketing products containing cannabis and cannabis-derived compounds in ways that violate the Federal Food, Drug and Cosmetic Act (FD&C Act) and that may put the health and safety of consumers at risk. The FDA, a Federal agency, has not enforced the CBD status since 2018.   Legality: Some manufacturers ship CBD products nationally, which is illegal. Often such products are sold online and are therefore available throughout the country. CBD is openly sold in head shops and health food stores in some states where such sales have not been explicitly legalized. Selling unapproved products with unsubstantiated therapeutic claims is not only a violation of the law, but also can put patients at risk, as these products have not been proven to be safe or effective. Federal illegality makes it difficult to conduct research on CBD.  Reference: "FDA Regulation of Cannabis and Cannabis-Derived Products, Including Cannabidiol (CBD)" - https://www.fda.gov/news-events/public-health-focus/fda-regulation-cannabis-and-cannabis-derived-products-including-cannabidiol-cbd  Warning: CBD is not FDA approved and has not undergo the same manufacturing controls as prescription  drugs.  This means that the purity and safety of available CBD may be questionable. Most of the time, despite manufacturer's claims, it is contaminated with THC (delta-9-tetrahydrocannabinol - the chemical in marijuana responsible for the "HIGH").  When this is the case, the THC contaminant will trigger a positive urine drug screen (UDS) test for Marijuana (carboxy-THC). Because a positive UDS for any illicit substance is a violation of our medication agreement, your opioid analgesics (pain medicine) may be permanently discontinued.  MORE ABOUT CBD  General Information: CBD  is a derivative of the Marijuana (cannabis sativa) plant discovered in 1940. It is one of the 113 identified substances found in Marijuana. It accounts for up to 40% of the plant's extract. As of 2018, preliminary clinical studies on CBD included research for the treatment of anxiety, movement disorders, and pain. CBD is available and consumed in multiple forms, including inhalation of smoke or vapor, as an aerosol spray, and by mouth. It may be supplied as an oil containing CBD, capsules, dried cannabis, or as a liquid solution. CBD is thought not to be as psychoactive as THC (delta-9-tetrahydrocannabinol - the chemical in marijuana responsible for the "HIGH"). Studies suggest that CBD may interact with different biological target receptors in the body, including cannabinoid and other neurotransmitter receptors. As of 2018 the mechanism of action for its biological effects has not been determined.  Side-effects  Adverse reactions: Dry mouth, diarrhea, decreased appetite, fatigue, drowsiness, malaise, weakness, sleep disturbances, and others.  Drug interactions: CBC may interact with other medications such as blood-thinners. (Last update: 11/19/2019) ____________________________________________________________________________________________    

## 2020-01-25 ENCOUNTER — Telehealth: Payer: Self-pay | Admitting: *Deleted

## 2020-01-25 NOTE — Telephone Encounter (Signed)
No problems post IT pump fill. 

## 2020-02-06 DIAGNOSIS — I5032 Chronic diastolic (congestive) heart failure: Secondary | ICD-10-CM | POA: Diagnosis not present

## 2020-02-15 ENCOUNTER — Other Ambulatory Visit: Payer: Self-pay | Admitting: Pain Medicine

## 2020-02-15 DIAGNOSIS — K219 Gastro-esophageal reflux disease without esophagitis: Secondary | ICD-10-CM

## 2020-02-22 ENCOUNTER — Other Ambulatory Visit: Payer: Self-pay

## 2020-02-22 MED ORDER — PAIN MANAGEMENT IT PUMP REFILL: 1.0000 | Freq: Once | INTRATHECAL | 0 refills | Status: AC

## 2020-03-08 DIAGNOSIS — I5032 Chronic diastolic (congestive) heart failure: Secondary | ICD-10-CM | POA: Diagnosis not present

## 2020-03-09 ENCOUNTER — Other Ambulatory Visit: Payer: Self-pay | Admitting: Pain Medicine

## 2020-03-09 DIAGNOSIS — M62838 Other muscle spasm: Secondary | ICD-10-CM

## 2020-03-09 DIAGNOSIS — G8929 Other chronic pain: Secondary | ICD-10-CM

## 2020-03-15 ENCOUNTER — Ambulatory Visit: Payer: No Typology Code available for payment source | Attending: Pain Medicine | Admitting: Pain Medicine

## 2020-03-15 ENCOUNTER — Encounter: Payer: Self-pay | Admitting: Pain Medicine

## 2020-03-15 ENCOUNTER — Other Ambulatory Visit: Payer: Self-pay

## 2020-03-15 VITALS — BP 161/92 | HR 119 | Temp 98.2°F | Ht 71.0 in | Wt 378.0 lb

## 2020-03-15 DIAGNOSIS — Z6841 Body Mass Index (BMI) 40.0 and over, adult: Secondary | ICD-10-CM | POA: Diagnosis present

## 2020-03-15 DIAGNOSIS — M79604 Pain in right leg: Secondary | ICD-10-CM | POA: Insufficient documentation

## 2020-03-15 DIAGNOSIS — Z451 Encounter for adjustment and management of infusion pump: Secondary | ICD-10-CM | POA: Diagnosis present

## 2020-03-15 DIAGNOSIS — G8929 Other chronic pain: Secondary | ICD-10-CM | POA: Diagnosis present

## 2020-03-15 DIAGNOSIS — F1120 Opioid dependence, uncomplicated: Secondary | ICD-10-CM | POA: Insufficient documentation

## 2020-03-15 DIAGNOSIS — Z79899 Other long term (current) drug therapy: Secondary | ICD-10-CM | POA: Diagnosis present

## 2020-03-15 DIAGNOSIS — Z978 Presence of other specified devices: Secondary | ICD-10-CM

## 2020-03-15 DIAGNOSIS — M79605 Pain in left leg: Secondary | ICD-10-CM | POA: Insufficient documentation

## 2020-03-15 DIAGNOSIS — M545 Low back pain, unspecified: Secondary | ICD-10-CM | POA: Diagnosis present

## 2020-03-15 DIAGNOSIS — M961 Postlaminectomy syndrome, not elsewhere classified: Secondary | ICD-10-CM | POA: Insufficient documentation

## 2020-03-15 DIAGNOSIS — E662 Morbid (severe) obesity with alveolar hypoventilation: Secondary | ICD-10-CM | POA: Insufficient documentation

## 2020-03-15 DIAGNOSIS — G894 Chronic pain syndrome: Secondary | ICD-10-CM | POA: Insufficient documentation

## 2020-03-15 DIAGNOSIS — Z79891 Long term (current) use of opiate analgesic: Secondary | ICD-10-CM | POA: Diagnosis present

## 2020-03-15 DIAGNOSIS — M5137 Other intervertebral disc degeneration, lumbosacral region: Secondary | ICD-10-CM

## 2020-03-15 NOTE — Patient Instructions (Signed)
______________________________________________________________________________________________  Body mass index (BMI)  Body mass index (BMI) is a common tool for deciding whether a person has an appropriate body weight.  It measures a persons weight in relation to their height.   According to the [employer] of health (NIH): Marland Kitchen A BMI of less than 18.5 means that a person is underweight. . A BMI of between 18.5 and 24.9 is ideal. . A BMI of between 25 and 29.9 is overweight. . A BMI over 30 indicates obesity.  Weight Management Required  URGENT: Your weight has been found to be adversely affecting your health.  Dear Mr. Sanden:  Your current Estimated body mass index is 55.51 kg/m as calculated from the following:   Height as of 01/24/20: _0  (1.803 m).   Weight as of 01/24/20: 398 lb (180.5 kg).  Please use the table below to identify your weight category and associated incidence of chronic pain, secondary to your weight.  Body Mass Index (BMI) Classification BMI level (kg/m2) Category Associated incidence of chronic pain  <18  Underweight   18.5-24.9 Ideal body weight   25-29.9 Overweight  20%  30-34.9 Obese (Class I)  68%  35-39.9 Severe obesity (Class II)  136%  >40 Extreme obesity (Class III)  254%   In addition: You will be considered "Morbidly Obese", if your BMI is above 30 and you have one or more of the following conditions which are known to be caused and/or directly associated with obesity: 1.    Type 2 Diabetes (Which in turn can lead to cardiovascular diseases (CVD), stroke, peripheral vascular diseases (PVD), retinopathy, nephropathy, and neuropathy) 2.    Cardiovascular Disease (High Blood Pressure; Congestive Heart Failure; High Cholesterol; Coronary Artery Disease; Angina; or History of Heart Attacks) 3.    Breathing problems (Asthma; obesity-hypoventilation syndrome; obstructive sleep apnea; chronic inflammatory airway disease; reactive airway  disease; or shortness of breath) 4.    Chronic kidney disease 5.    Liver disease (nonalcoholic fatty liver disease) 6.    High blood pressure 7.    Acid reflux (gastroesophageal reflux disease; heartburn) 8.    Osteoarthritis (OA) (with any of the following: hip pain; knee pain; and/or low back pain) 9.    Low back pain (Lumbar Facet Syndrome; and/or Degenerative Disc Disease) 10.  Hip pain (Osteoarthritis of hip) (For every 1 lbs of added body weight, there is a 2 lbs increase in pressure inside of each hip articulation. 1:2 mechanical relationship) 11.  Knee pain (Osteoarthritis of knee) (For every 1 lbs of added body weight, there is a 4 lbs increase in pressure inside of each knee articulation. 1:4 mechanical relationship) (patients with a BMI>30 kg/m2 were 6.8 times more likely to develop knee OA than normal-weight individuals) 12.  Cancer: Epidemiological studies have shown that obesity is a risk factor for: post-menopausal breast cancer; cancers of the endometrium, colon and kidney cancer; malignant adenomas of the oesophagus. Obese subjects have an approximately 1.5-3.5-fold increased risk of developing these cancers compared with normal-weight subjects, and it has been estimated that between 15 and 45% of these cancers can be attributed to overweight. More recent studies suggest that obesity may also increase the risk of other types of cancer, including pancreatic, hepatic and gallbladder cancer. (Ref: Obesity and cancer. Pischon T, Nthlings U, [employer] H. Proc Nutr Soc. 2008 May;67(2):128-45. doi: 17.0017/C9449675916384665.) The International Agency for Research on Cancer (IARC) has identified 13 cancers associated with overweight and obesity: meningioma, multiple myeloma, adenocarcinoma of the esophagus, and  cancers of the thyroid, postmenopausal breast cancer, gallbladder, stomach, liver, pancreas, kidney, ovaries, uterus, colon and rectal (colorectal) cancers. 40 percent of all cancers  diagnosed in women and 24 percent of those diagnosed in men are associated with overweight and obesity.  Recommendation: At this point it is urgent that you take a step back and concentrate in loosing weight. Dedicate 100% of your efforts on this task. Nothing else will improve your health more than bringing your weight down and your BMI to less than 30. If you are here, you probably have chronic pain. We know that most chronic pain patients have difficulty exercising secondary to their pain. For this reason, you must rely on proper nutrition and diet in order to lose the weight. If your BMI is above 40, you should seriously consider bariatric surgery. A realistic goal is to lose 10% of your body weight over a period of 12 months.  Be honest to yourself, if over time you have unsuccessfully tried to lose weight, then it is time for you to seek professional help and to enter a medically supervised weight management program, and/or undergo bariatric surgery. Stop procrastinating.   Pain management considerations:  1.    Pharmacological Problems: Be advised that the use of opioid analgesics (oxycodone; hydrocodone; morphine; methadone; codeine; and all of their derivatives) have been associated with decreased metabolism and weight gain.  For this reason, should we see that you are unable to lose weight while taking these medications, it may become necessary for [place] to taper down and indefinitely discontinue them.  2.    Technical Problems: The incidence of successful interventional therapies decreases as the patient's BMI increases. It is much more difficult to accomplish a safe and effective interventional therapy on a patient with a BMI above 35. 3.    Radiation Exposure Problems: The x-rays machine, used to accomplish injection therapies, will automatically increase their x-ray output in order to capture an appropriate bone image. This means that radiation exposure increases exponentially with the patient's  BMI. (The higher the BMI, the higher the radiation exposure.) Although the level of radiation used at a given time is still safe to the patient, it is not for the physician and/or assisting staff. Unfortunately, radiation exposure is accumulative. Because physicians and the staff have to do procedures and be exposed on a daily basis, this can result in health problems such as cancer and radiation burns. Radiation exposure to the staff is monitored by the radiation batches that they wear. The exposure levels are reported back to the staff on a quarterly basis. Depending on levels of exposure, physicians and staff may be obligated by law to decrease this exposure. This means that they have the right and obligation to refuse providing therapies where they may be overexposed to radiation. For this reason, physicians may decline to offer therapies such as radiofrequency ablation or implants to patients with a BMI above 40. 4.    Current Trends: Be advised that the current trend is to no longer offer certain therapies to patients with a BMI equal to, or above 35, due to increase perioperative risks, increased technical procedural difficulties, and excessive radiation exposure to healthcare personnel.  ______________________________________________________________________________________________

## 2020-03-15 NOTE — Progress Notes (Signed)
Safety precautions to be maintained throughout the outpatient stay will include: orient to surroundings, keep bed in low position, maintain call bell within reach at all times, provide assistance with transfer out of bed and ambulation.  

## 2020-03-15 NOTE — Progress Notes (Signed)
PROVIDER NOTE: Information contained herein reflects review and annotations entered in association with encounter. Interpretation of such information and data should be left to medically-trained personnel. Information provided to [name] can be located elsewhere in the medical record under "[name]". Document created using STT-dictation technology, any transcriptional errors that may result from process are unintentional.    [name]: [name]  Service Category: Procedure  Provider: Gaspar Cola, MD  DOB: Aug 20, 1965  DOS: 03/15/2020  Location: [facility]  MRN: [id number]  Setting: Ambulatory - outpatient  Referring Provider: Antony Contras, MD  Type: Established [name]  Specialty: Interventional Pain Management  PCP: Antony Contras, MD   Primary Reason for Visit: Interventional Pain Management Treatment. CC: Back Pain  Procedure:          Intrathecal Drug Delivery System (IDDS):  Type: Reservoir Refill [phone]) We increased the bupivacaine from 20 mg/mL to 30 mg/mL.  Because leaving the infusion at the same rate would have delivered 20 mg/day of bupivacaine, we had to readjust the infusion rate to deliver only a 20% increase in the bupivacaine.  Previously he was receiving 13 mg/day and we have now programmed that to deliver approximately 16 mg/day of bupivacaine.  This has resulted in a decrease in the fentanyl and baclofen of approximately 20%.  Region: Abdominal Laterality: Left  Type of Pump: Medtronic Synchromed II Delivery Route: Intrathecal Type of Pain Treated: Neuropathic/Nociceptive Primary Medication Class: Opioid/opiate  Medication, Concentration, Infusion Program, & Delivery Rate: Please see scanned programming printout.   Indications: 1. Chronic pain syndrome   2. Chronic low back pain (Primary Area of Pain) (Bilateral) ([name])   3. Chronic lower extremity pain (Secondary area of Pain) (Bilateral) (L>[name])   4. DDD (degenerative disc disease),  lumbosacral   5. Failed back surgical syndrome   6. Pharmacologic therapy   7. Presence of intrathecal pump   8. Encounter for adjustment or management of infusion pump   9. Long term prescription benzodiazepine use   10. Long term prescription opiate use   11. Uncomplicated opioid dependence ([place])    Pain Assessment: Self-Reported Pain Score: 6 /10             Reported level is compatible with observation.         The [name] was informed today that with the change that we have made, they will be a period of time during which he will be getting less medication until the all medicine clears the catheter.  Once the new medicine has cleared the catheter, then he should be getting the benefit of the increased local anesthetic.  Nonopioid transfered 01/24/2020: Zanaflex, Flexeril, Nexium, Lyrica, Amitiza, calcium, and vitamin D3.  Pharmacotherapy Assessment  Analgesic: Oxycodone IR 10 mg, 1 tab PO q 6 hrs (40 mg/day of oxycodone)+ 52.9 mcg/hr intrathecal PF-Fentanyl (1,268.8 mcg/day of Fentanyl) MME/day:60mg /day(from oral medication)+ 126.96 mg/day (from intrathecal fentanyl) = 186.96MME/day(Aprox. 1.08MME/day/kg).   Monitoring: [place] PMP: PDMP reviewed during this encounter.       Pharmacotherapy: No side-effects or adverse reactions reported. Compliance: No problems identified. Effectiveness: Clinically acceptable. Plan: Refer to "POC".  UDS:  Summary  Date Value Ref Range Status  10/24/2019 Note  Final    Comment:    ==================================================================== [employer] Select 13 (MW) ==================================================================== Test                             Result       Flag  Units  Drug Present and Declared for Prescription Verification   Fentanyl                       40           EXPECTED   ng/mg creat   Norfentanyl                    56           EXPECTED   ng/mg creat    Source of fentanyl is a scheduled  prescription medication, including    IV, patch, and transmucosal formulations. Norfentanyl is an expected    metabolite of fentanyl.  Drug Absent but Declared for Prescription Verification   Oxycodone                      Not Detected UNEXPECTED ng/mg creat ==================================================================== Test                      Result    Flag   Units      Ref Range   Creatinine              91               mg/dL      >=20 ==================================================================== Declared Medications:  The flagging and interpretation on this report are based on the  following declared medications.  Unexpected results may arise from  inaccuracies in the declared medications.   **Note: The testing scope of this panel includes these medications:   Fentanyl  Oxycodone   **Note: The testing scope of this panel does not include the  following reported medications:   Acetaminophen (Tylenol)  Aspirin  Baclofen  Bupivacaine  Calcium  Cyclobenzaprine (Flexeril)  Escitalopram (Lexapro)  Esomeprazole (Nexium)  Ibuprofen (Advil)  Lubiprostone (Amitiza)  Metolazone (Zaroxolyn)  Potassium  Pregabalin (Lyrica)  Promethazine (Phenergan)  Rosuvastatin (Crestor)  Tizanidine (Zanaflex)  Torsemide (Demadex)  Vitamin D3 ==================================================================== For clinical consultation, please call [phone]. ====================================================================     Intrathecal Pump Therapy Assessment  Manufacturer: Medtronic Synchromed Type: Programmable Volume: 40 mL reservoir MRI compatibility: Yes   Drug content:  Primary Medication Class:Opioid Primary Medication:PF-Fentanyl(2,000.0 mcg/ml) Secondary Medication:PF-Bupivacaine(30.40m/mL) Other Medication:PF-Baclofen (200 mcg/mL)   Programming:  Type: Simple continuous. See pump readout for details.   Changes:  Medication Change:  Today we made changes on the medication where we have increased the concentration of the bupivacaine from 20 mg/mL to 30 mg/mL.  The concentration on the fentanyl and they baclofen was left the same. Rate Change: The bupivacaine rate was increased by 20% but this also resulted in a decrease in the fentanyl and baclofen of approximately 20%.  Reported side-effects or adverse reactions: None reported  Effectiveness: Described as relatively effective, allowing for increase in activities of daily living (ADL) Clinically meaningful improvement in function (CMIF): Sustained CMIF goals met  Plan: Pump refill today w/ Analysis and programming  Pre-op H&P Assessment:  Mr. HSiis a 54y.o. (year old), [name], Brian today for interventional treatment. He  has a past surgical history that includes Back surgery; morphine pump; Tonsillectomy; Joint replacement; Shoulder arthroscopy with subacromial decompression, rotator cuff repair and bicep tendon repair (Right, 08/18/2013); Shoulder arthroscopy with subacromial decompression and bicep tendon repair (Left, 10/06/2013); Laparoscopic gastric sleeve resection (N/A, 01/08/2015); and RIGHT HEART CATH (N/A, 09/22/2019). Mr. HRecorehas a current medication list which includes the following prescription(s): acetaminophen,  aspirin ec, cyclobenzaprine, escitalopram, gabapentin, ibuprofen, metolazone, morphine, [START ON 04/06/2020] morphine, [START ON 05/06/2020] morphine, NON FORMULARY, potassium chloride, pregabalin, promethazine, rosuvastatin, tizanidine, torsemide, calcium carbonate, vitamin d3, esomeprazole, and morphine. His primarily concern today is the Back Pain  Initial Vital Signs:  Pulse/HCG Rate: (!) 119  Temp: 98.2 F (36.8 C) Resp:   BP: (!) 161/92 SpO2: 100 %  BMI: Estimated body mass index is 52.72 kg/m as calculated from the following:   Height as of this encounter: _0  (1.803 m).   Weight as of this encounter: 378 lb (171.5 kg).  Risk  Assessment: Allergies: Reviewed. He is allergic to tape.  Allergy Precautions: None required Coagulopathies: Reviewed. None identified.  Blood-thinner therapy: None at this time Active Infection(s): Reviewed. None identified. [name] is afebrile  Site Confirmation: [name] was asked to confirm the procedure and laterality before marking the site Procedure checklist: Completed Consent: Before the procedure and under the influence of no sedative(s), amnesic(s), or anxiolytics, the [name] was informed of the treatment options, risks and possible complications. To fulfill our ethical and legal obligations, as recommended by the American Medical Association's Code of Ethics, I have informed the [name] of my clinical impression; the nature and purpose of the treatment or procedure; the risks, benefits, and possible complications of the intervention; the alternatives, including doing nothing; the risk(s) and benefit(s) of the alternative treatment(s) or procedure(s); and the risk(s) and benefit(s) of doing nothing.  [name] was provided with information about the general risks and possible complications associated with most interventional procedures. These include, but are not limited to: failure to achieve desired goals, infection, bleeding, organ or nerve damage, allergic reactions, paralysis, and/or death.  In addition, he was informed of those risks and possible complications associated to this particular procedure, which include, but are not limited to: damage to the implant; failure to decrease pain; local, systemic, or serious CNS infections, intraspinal abscess with possible cord compression and paralysis, or life-threatening such as meningitis; bleeding; organ damage; nerve injury or damage with subsequent sensory, motor, and/or autonomic system dysfunction, resulting in transient or permanent pain, numbness, and/or weakness of one or several areas of the body; allergic reactions, either  minor or major life-threatening, such as anaphylactic or anaphylactoid reactions.  Furthermore, [name] was informed of those risks and complications associated with the medications. These include, but are not limited to: allergic reactions (i.e.: anaphylactic or anaphylactoid reactions); endorphine suppression; bradycardia and/or hypotension; water retention and/or peripheral vascular relaxation leading to lower extremity edema and possible stasis ulcers; respiratory depression and/or shortness of breath; decreased metabolic rate leading to weight gain; swelling or edema; medication-induced neural toxicity; particulate matter embolism and blood vessel occlusion with resultant organ, and/or nervous system infarction; and/or intrathecal granuloma formation with possible spinal cord compression and permanent paralysis.  Before refilling the pump [name] was informed that some of the medications used in the devise may not be FDA approved for such use and therefore it constitutes an off-label use of the medications.  Finally, he was informed that Medicine is not an exact science; therefore, there is also the possibility of unforeseen or unpredictable risks and/or possible complications that may result in a catastrophic outcome. The [name] indicated having understood very clearly. We have given the [name] no guarantees and we have made no promises. Enough time was given to the [name] to ask questions, all of which were answered to the [name]'s satisfaction. [name] has indicated that he wanted to continue with  the procedure. Attestation: I, the ordering provider, attest that I have discussed with the [name] the benefits, risks, side-effects, alternatives, likelihood of achieving goals, and potential problems during recovery for the procedure that I have provided informed consent. Date  Time: 03/15/2020  1:24 PM  Pre-Procedure Preparation:  Monitoring: As per clinic protocol. Respiration,  ETCO2, SpO2, BP, heart rate and rhythm monitor placed and checked for adequate function Safety Precautions: [name] was assessed for positional comfort and pressure points before starting the procedure. Time-out: I initiated and conducted the "Time-out" before starting the procedure, as per protocol. The [name] was asked to participate by confirming the accuracy of the "Time Out" information. Verification of the correct person, site, and procedure were performed and confirmed by me, the nursing staff, and the [name]. "Time-out" conducted as per Joint Commission's Universal Protocol (UP.01.01.01). Time: 1331  Description of Procedure:          Position: Supine Target Area: Central-port of intrathecal pump. Approach: Anterior, 90 degree angle approach. Area Prepped: Entire Area around the pump implant. DuraPrep (Iodine Povacrylex [0.7% available iodine] and Isopropyl Alcohol, 74% w/w) Safety Precautions: Aspiration looking for blood return was conducted prior to all injections. At no point did we inject any substances, as a needle was being advanced. No attempts were made at seeking any paresthesias. Safe injection practices and needle disposal techniques used. Medications properly checked for expiration dates. SDV (single dose vial) medications used. Description of the Procedure: Protocol guidelines were followed. Two nurses trained to do implant refills were present during the entire procedure. The refill medication was checked by both healthcare providers as well as the [name]. The [name] was included in the "Time-out" to verify the medication. The [name] was placed in position. The pump was identified. The area was prepped in the usual manner. The sterile template was positioned over the pump, making sure the side-port location matched that of the pump. Both, the pump and the template were held for stability. The needle provided in the Medtronic Kit was then introduced thru the center of the  template and into the central port. The pump content was aspirated and discarded volume documented. The new medication was slowly infused into the pump, thru the filter, making sure to avoid overpressure of the device. The needle was then removed and the area cleansed, making sure to leave some of the prepping solution back to take advantage of its long term bactericidal properties. The pump was interrogated and programmed to reflect the correct medication, volume, and dosage. The program was printed and taken to the physician for approval. Once checked and signed by the physician, a copy was provided to the [name] and another scanned into the EMR. Vitals:   03/15/20 1323  BP: (!) 161/92  Pulse: (!) 119  Temp: 98.2 F (36.8 C)  SpO2: 100%  Weight: (!) 378 lb (171.5 kg)  Height: $Remove'5\' 11"'mlWzaiU$  (1.803 m)    Start Time: 1333 hrs. End Time: 1345 hrs. Materials & Medications: Medtronic Refill Kit Medication(s): Please see chart orders for details.  Imaging Guidance:          Type of Imaging Technique: None used Indication(s): N/A Exposure Time: No [name] exposure Contrast: None used. Fluoroscopic Guidance: N/A Ultrasound Guidance: N/A Interpretation: N/A  Antibiotic Prophylaxis:   Anti-infectives (From admission, onward)   None     Indication(s): None identified  Post-operative Assessment:  Post-procedure Vital Signs:  Pulse/HCG Rate: (!) 119  Temp: 98.2 F (36.8 C) Resp:   BP: (!) 161/92  SpO2: 100 %  EBL: None  Complications: No immediate post-treatment complications observed by team, or reported by [name].  Note: The [name] tolerated the entire procedure well. A repeat set of vitals were taken after the procedure and the [name] was kept under observation following institutional policy, for this type of procedure. Post-procedural neurological assessment was performed, showing return to baseline, prior to discharge. The [name] was provided with post-procedure discharge  instructions, including a section on how to identify potential problems. Should any problems arise concerning this procedure, the [name] was given instructions to immediately contact us, at any time, without hesitation. In any case, we plan to contact the [name] by telephone for a follow-up status report regarding this interventional procedure.  Comments:  No additional relevant information.  Plan of Care  Orders:  Orders Placed This Encounter  Procedures  . PUMP REFILL    Maintain Protocol by having two(2) healthcare providers during procedure and programming.    Scheduling Instructions:     Please refill intrathecal pump today.    Order Specific Question:   Where will this procedure be performed?    Answer:   [facility]  . PUMP REFILL    Whenever possible schedule on a procedure today.    Standing Status:   Future    Standing Expiration Date:   08/12/2020    Scheduling Instructions:     Please schedule intrathecal pump refill based on pump programming. Avoid schedule intervals of more than 120 days (4 months).    Order Specific Question:   Where will this procedure be performed?    Answer:   [facility]  . Informed Consent Details: Physician/Practitioner Attestation; Transcribe to consent form and obtain [name] signature    Transcribe to consent form and obtain [name] signature.    Order Specific Question:   Physician/Practitioner attestation of informed consent for procedure/surgical case    Answer:   I, the physician/practitioner, attest that I have discussed with the [name] the benefits, risks, side effects, alternatives, likelihood of achieving goals and potential problems during recovery for the procedure that I have provided informed consent.    Order Specific Question:   Procedure    Answer:   Intrathecal pump refill    Order Specific Question:   Physician/Practitioner performing the procedure    Answer:   Attending Physician: Kathlen Brunswick. Dossie Arbour, MD &  designated trained staff    Order Specific Question:   Indication/Reason    Answer:   Chronic Pain Syndrome (G89.4), presence of an intrathecal pump (Z97.8)   Chronic Opioid Analgesic:  Oxycodone IR 10 mg, 1 tab PO q 6 hrs (40 mg/day of oxycodone)+ 52.9 mcg/hr intrathecal PF-Fentanyl (1,268.8 mcg/day of Fentanyl) MME/day:60mg /day(from oral medication)+ 126.96 mg/day (from intrathecal fentanyl) = 186.96MME/day(Aprox. 1.08MME/day/kg).   Medications ordered for procedure: No orders of the defined types were placed in this encounter.  Medications administered: Jeneen Rinks B. Pernice had no medications administered during this visit.  See the medical record for exact dosing, route, and time of administration.  Follow-up plan:   Return for Pump Refill (Max:73mo).       Interventional management options: Considering: NOTE: NO RFA until BMI <35  Palliative/therapeuticintrathecal pump refill and maintenance Diagnostic left Genicular NB Possible left Genicular nerve RFA Possible bilateral lumbar facet RFA(NO Lumbar RFA until BMI <35) Diagnostic caudal ESI+epidurogram Possible Racz procedure Diagnostic bilateral IA shoulder joint injection Diagnostic bilateral suprascapular NB Possible bilateral suprascapular nerve RFA Diagnostic LESI vs TFESI   Palliative PRN treatment(s): Palliative/therapeuticintrathecal  pump refill and maintenance Diagnostic bilateral lumbar facet block #2     Recent Visits Date Type Provider Dept  01/24/20 Procedure visit Milinda Pointer, MD [facility]  Showing recent visits within past 90 days and meeting all other requirements Today's Visits Date Type Provider Dept  03/15/20 Procedure visit Milinda Pointer, MD [facility]  Showing today's visits and meeting all other requirements Future Appointments Date Type Provider Dept  05/17/20 Appointment Milinda Pointer, MD [facility]  Showing future  appointments within next 90 days and meeting all other requirements  Disposition: Discharge home  Discharge (Date  Time): 03/15/2020; 1400 hrs.   Primary Care Physician: Antony Contras, MD Location: [facility] Note by: Gaspar Cola, MD Date: 03/15/2020; Time: 2:55 PM  Disclaimer:  Medicine is not an Chief Strategy Officer. The only guarantee in medicine is that nothing is guaranteed. It is important to note that the decision to proceed with this intervention was based on the information collected from the [name]. The Data and conclusions were drawn from the [name]'s questionnaire, the interview, and the physical examination. Because the information was provided in large part by the [name], it cannot be guaranteed that it has not been purposely or unconsciously manipulated. Every effort has been made to obtain as much relevant data as possible for this evaluation. It is important to note that the conclusions that lead to this procedure are derived in large part from the available data. Always take into account that the treatment will also be dependent on availability of resources and existing treatment guidelines, considered by other Pain Management Practitioners as being common knowledge and practice, at the time of the intervention. For Medico-Legal purposes, it is also important to point out that variation in procedural techniques and pharmacological choices are the acceptable norm. The indications, contraindications, technique, and results of the above procedure should only be interpreted and judged by a Board-Certified Interventional Pain Specialist with extensive familiarity and expertise in the same exact procedure and technique.

## 2020-03-20 DIAGNOSIS — R5381 Other malaise: Secondary | ICD-10-CM | POA: Diagnosis not present

## 2020-03-20 DIAGNOSIS — I5032 Chronic diastolic (congestive) heart failure: Secondary | ICD-10-CM | POA: Diagnosis not present

## 2020-03-20 DIAGNOSIS — Z8719 Personal history of other diseases of the digestive system: Secondary | ICD-10-CM | POA: Diagnosis not present

## 2020-03-20 DIAGNOSIS — G894 Chronic pain syndrome: Secondary | ICD-10-CM | POA: Diagnosis not present

## 2020-03-20 DIAGNOSIS — G4730 Sleep apnea, unspecified: Secondary | ICD-10-CM | POA: Diagnosis not present

## 2020-03-20 DIAGNOSIS — E7800 Pure hypercholesterolemia, unspecified: Secondary | ICD-10-CM | POA: Diagnosis not present

## 2020-03-20 DIAGNOSIS — F321 Major depressive disorder, single episode, moderate: Secondary | ICD-10-CM | POA: Diagnosis not present

## 2020-03-20 DIAGNOSIS — R7303 Prediabetes: Secondary | ICD-10-CM | POA: Diagnosis not present

## 2020-03-26 MED FILL — Medication: INTRATHECAL | Qty: 1 | Status: AC

## 2020-04-07 DIAGNOSIS — I5032 Chronic diastolic (congestive) heart failure: Secondary | ICD-10-CM | POA: Diagnosis not present

## 2020-04-23 ENCOUNTER — Other Ambulatory Visit: Payer: Self-pay

## 2020-04-23 MED ORDER — PAIN MANAGEMENT IT PUMP REFILL: 1.0000 | Freq: Once | INTRATHECAL | 0 refills | Status: AC

## 2020-05-08 ENCOUNTER — Ambulatory Visit: Payer: Medicare HMO | Admitting: Internal Medicine

## 2020-05-08 DIAGNOSIS — I5032 Chronic diastolic (congestive) heart failure: Secondary | ICD-10-CM | POA: Diagnosis not present

## 2020-05-16 NOTE — Progress Notes (Signed)
PROVIDER NOTE: Information contained herein reflects review and annotations entered in association with encounter. Interpretation of such information and data should be left to medically-trained personnel. Information provided to patient can be located elsewhere in the medical record under "Patient Instructions". Document created using STT-dictation technology, any transcriptional errors that may result from process are unintentional.    Patient: [name]  Service Category: Procedure  Provider: Gaspar Cola, MD  DOB: 1965-12-23  DOS: 05/17/2020  Location: [facility]  MRN: [id number]  Setting: Ambulatory - outpatient  Referring Provider: Antony Contras, MD  Type: Established Patient  Specialty: Interventional Pain Management  PCP: Antony Contras, MD   Primary Reason for Visit: Interventional Pain Management Treatment. CC: Back Pain  Procedure:          Intrathecal Drug Delivery System (IDDS):  Type: Reservoir Refill [id number]) No rate change Region: Abdominal Laterality: Left  Type of Pump: Medtronic Synchromed II Delivery Route: Intrathecal Type of Pain Treated: Neuropathic/Nociceptive Primary Medication Class: Opioid/opiate  Medication, Concentration, Infusion Program, & Delivery Rate: Please see scanned programming printout.   Indications: 1. Chronic pain syndrome   2. Chronic low back pain (Primary Area of Pain) (Bilateral) (R>L)   3. Chronic lower extremity pain (Secondary area of Pain) (Bilateral) (L>R)   4. DDD (degenerative disc disease), lumbosacral   5. Failed back surgical syndrome   6. Lumbar facet syndrome (Bilateral) (R>L)   7. Pharmacologic therapy   8. Presence of intrathecal pump   9. Encounter for adjustment or management of infusion pump   10. Uncomplicated opioid dependence (Anselmo)    Pain Assessment: Self-Reported Pain Score: 8 /10             Reported level is compatible with observation.         The patient indicates that he believes he  had Covid since his and her family was affected.  He refers that he thinks he had a until about 6 days ago.  He is now feeling better and since his pump needs to be refilled, we will go ahead and do that taking the appropriate precautions using N95 masks and full PPE.  Pharmacotherapy Assessment  Analgesic: Oxycodone IR 10 mg, 1 tab PO q 6 hrs (40 mg/day of oxycodone)+ 52.9 mcg/hr intrathecal PF-Fentanyl (1,268.8 mcg/day of Fentanyl) MME/day:63m/day(from oral medication)+ 126.96 mg/day (from intrathecal fentanyl) = 186.96MME/day(Aprox. 1.08MME/day/kg).   Monitoring: [place] PMP: PDMP reviewed during this encounter.       Pharmacotherapy: No side-effects or adverse reactions reported. Compliance: No problems identified. Effectiveness: Clinically acceptable. Plan: Refer to "POC".  UDS:  Summary  Date Value Ref Range Status  10/24/2019 Note  Final    Comment:    ==================================================================== [employer] Select 13 (MW) ==================================================================== Test                             Result       Flag       Units  Drug Present and Declared for Prescription Verification   Fentanyl                       40           EXPECTED   ng/mg creat   Norfentanyl                    56           EXPECTED   ng/mg  creat    Source of fentanyl is a scheduled prescription medication, including    IV, patch, and transmucosal formulations. Norfentanyl is an expected    metabolite of fentanyl.  Drug Absent but Declared for Prescription Verification   Oxycodone                      Not Detected UNEXPECTED ng/mg creat ==================================================================== Test                      Result    Flag   Units      Ref Range   Creatinine              91               mg/dL      >=20 ==================================================================== Declared Medications:  The flagging and interpretation on  this report are based on the  following declared medications.  Unexpected results may arise from  inaccuracies in the declared medications.   **Note: The testing scope of this panel includes these medications:   Fentanyl  Oxycodone   **Note: The testing scope of this panel does not include the  following reported medications:   Acetaminophen (Tylenol)  Aspirin  Baclofen  Bupivacaine  Calcium  Cyclobenzaprine (Flexeril)  Escitalopram (Lexapro)  Esomeprazole (Nexium)  Ibuprofen (Advil)  Lubiprostone (Amitiza)  Metolazone (Zaroxolyn)  Potassium  Pregabalin (Lyrica)  Promethazine (Phenergan)  Rosuvastatin (Crestor)  Tizanidine (Zanaflex)  Torsemide (Demadex)  Vitamin D3 ==================================================================== For clinical consultation, please call [phone]. ====================================================================     Intrathecal Pump Therapy Assessment  Manufacturer: Medtronic Synchromed Type: Programmable Volume: 40 mL reservoir MRI compatibility: Yes   Drug content:  Primary Medication Class:Opioid Primary Medication:PF-Fentanyl(2,000.0 mcg/ml) Secondary Medication:PF-Bupivacaine(30.75m/mL) Other Medication:PF-Baclofen (200 mcg/mL)   Programming:  Type: Simple continuous. See pump readout for details.   Changes:  Medication Change: None at this point Rate Change: No change in rate  Reported side-effects or adverse reactions: None reported  Effectiveness: Described as relatively effective, allowing for increase in activities of daily living (ADL) Clinically meaningful improvement in function (CMIF): Sustained CMIF goals met  Plan: Pump refill today  Pre-op H&P Assessment:  Mr. HWinskiis a 55y.o. (year old), male patient, seen today for interventional treatment. He  has a past surgical history that includes Back surgery; morphine pump; Tonsillectomy; Joint replacement; Shoulder arthroscopy with  subacromial decompression, rotator cuff repair and bicep tendon repair (Right, 08/18/2013); Shoulder arthroscopy with subacromial decompression and bicep tendon repair (Left, 10/06/2013); Laparoscopic gastric sleeve resection (N/A, 01/08/2015); and RIGHT HEART CATH (N/A, 09/22/2019). Mr. HDittmarhas a current medication list which includes the following prescription(s): acetaminophen, aspirin ec, calcium carbonate, vitamin d3, cyclobenzaprine, escitalopram, esomeprazole, gabapentin, ibuprofen, metolazone, naloxone, NON FORMULARY, potassium chloride, pregabalin, promethazine, rosuvastatin, tizanidine, torsemide, [START ON 06/05/2020] morphine, [START ON 07/05/2020] morphine, and [START ON 08/04/2020] morphine. His primarily concern today is the Back Pain  Initial Vital Signs:  Pulse/HCG Rate: (!) 122  Temp: (!) 97.4 F (36.3 C) Resp: 18 BP: (!) 146/78 SpO2: 97 %  BMI: Estimated body mass index is 50.21 kg/m as calculated from the following:   Height as of this encounter: '5\' 11"'  (1.803 m).   Weight as of this encounter: 360 lb (163.3 kg).  Risk Assessment: Allergies: Reviewed. He is allergic to tape.  Allergy Precautions: None required Coagulopathies: Reviewed. None identified.  Blood-thinner therapy: None at this time Active Infection(s): Reviewed. None identified. Mr. HLiskais afebrile  Site  Confirmation: Mr. Flenner was asked to confirm the procedure and laterality before marking the site Procedure checklist: Completed Consent: Before the procedure and under the influence of no sedative(s), amnesic(s), or anxiolytics, the patient was informed of the treatment options, risks and possible complications. To fulfill our ethical and legal obligations, as recommended by the American Medical Association's Code of Ethics, I have informed the patient of my clinical impression; the nature and purpose of the treatment or procedure; the risks, benefits, and possible complications of the intervention; the  alternatives, including doing nothing; the risk(s) and benefit(s) of the alternative treatment(s) or procedure(s); and the risk(s) and benefit(s) of doing nothing.  Mr. Dimalanta was provided with information about the general risks and possible complications associated with most interventional procedures. These include, but are not limited to: failure to achieve desired goals, infection, bleeding, organ or nerve damage, allergic reactions, paralysis, and/or death.  In addition, he was informed of those risks and possible complications associated to this particular procedure, which include, but are not limited to: damage to the implant; failure to decrease pain; local, systemic, or serious CNS infections, intraspinal abscess with possible cord compression and paralysis, or life-threatening such as meningitis; bleeding; organ damage; nerve injury or damage with subsequent sensory, motor, and/or autonomic system dysfunction, resulting in transient or permanent pain, numbness, and/or weakness of one or several areas of the body; allergic reactions, either minor or major life-threatening, such as anaphylactic or anaphylactoid reactions.  Furthermore, Mr. Vicario was informed of those risks and complications associated with the medications. These include, but are not limited to: allergic reactions (i.e.: anaphylactic or anaphylactoid reactions); endorphine suppression; bradycardia and/or hypotension; water retention and/or peripheral vascular relaxation leading to lower extremity edema and possible stasis ulcers; respiratory depression and/or shortness of breath; decreased metabolic rate leading to weight gain; swelling or edema; medication-induced neural toxicity; particulate matter embolism and blood vessel occlusion with resultant organ, and/or nervous system infarction; and/or intrathecal granuloma formation with possible spinal cord compression and permanent paralysis.  Before refilling the pump Mr. Warf  was informed that some of the medications used in the devise may not be FDA approved for such use and therefore it constitutes an off-label use of the medications.  Finally, he was informed that Medicine is not an exact science; therefore, there is also the possibility of unforeseen or unpredictable risks and/or possible complications that may result in a catastrophic outcome. The patient indicated having understood very clearly. We have given the patient no guarantees and we have made no promises. Enough time was given to the patient to ask questions, all of which were answered to the patient's satisfaction. Mr. Wence has indicated that he wanted to continue with the procedure. Attestation: I, the ordering provider, attest that I have discussed with the patient the benefits, risks, side-effects, alternatives, likelihood of achieving goals, and potential problems during recovery for the procedure that I have provided informed consent. Date   Time: 05/17/2020 12:57 PM  Pre-Procedure Preparation:  Monitoring: As per clinic protocol. Respiration, ETCO2, SpO2, BP, heart rate and rhythm monitor placed and checked for adequate function Safety Precautions: Patient was assessed for positional comfort and pressure points before starting the procedure. Time-out: I initiated and conducted the "Time-out" before starting the procedure, as per protocol. The patient was asked to participate by confirming the accuracy of the "Time Out" information. Verification of the correct person, site, and procedure were performed and confirmed by me, the nursing staff, and the patient. "Time-out" conducted as per  Joint Commission's Universal Protocol (UP.01.01.01). Time: 1311  Description of Procedure:          Position: Supine Target Area: Central-port of intrathecal pump. Approach: Anterior, 90 degree angle approach. Area Prepped: Entire Area around the pump implant. DuraPrep (Iodine Povacrylex [0.7% available iodine] and  Isopropyl Alcohol, 74% w/w) Safety Precautions: Aspiration looking for blood return was conducted prior to all injections. At no point did we inject any substances, as a needle was being advanced. No attempts were made at seeking any paresthesias. Safe injection practices and needle disposal techniques used. Medications properly checked for expiration dates. SDV (single dose vial) medications used. Description of the Procedure: Protocol guidelines were followed. Two nurses trained to do implant refills were present during the entire procedure. The refill medication was checked by both healthcare providers as well as the patient. The patient was included in the "Time-out" to verify the medication. The patient was placed in position. The pump was identified. The area was prepped in the usual manner. The sterile template was positioned over the pump, making sure the side-port location matched that of the pump. Both, the pump and the template were held for stability. The needle provided in the Medtronic Kit was then introduced thru the center of the template and into the central port. The pump content was aspirated and discarded volume documented. The new medication was slowly infused into the pump, thru the filter, making sure to avoid overpressure of the device. The needle was then removed and the area cleansed, making sure to leave some of the prepping solution back to take advantage of its long term bactericidal properties. The pump was interrogated and programmed to reflect the correct medication, volume, and dosage. The program was printed and taken to the physician for approval. Once checked and signed by the physician, a copy was provided to the patient and another scanned into the EMR. Vitals:   05/17/20 1255  BP: (!) 146/78  Pulse: (!) 122  Resp: 18  Temp: (!) 97.4 F (36.3 C)  SpO2: 97%  Weight: (!) 360 lb (163.3 kg)  Height: '5\' 11"'  (1.803 m)    Start Time: 1311 hrs. End Time: 1315  hrs. Materials & Medications: Medtronic Refill Kit Medication(s): Please see chart orders for details.  Imaging Guidance:          Type of Imaging Technique: None used Indication(s): N/A Exposure Time: No patient exposure Contrast: None used. Fluoroscopic Guidance: N/A Ultrasound Guidance: N/A Interpretation: N/A  Antibiotic Prophylaxis:   Anti-infectives (From admission, onward)   None     Indication(s): None identified  Post-operative Assessment:  Post-procedure Vital Signs:  Pulse/HCG Rate: (!) 122  Temp: (!) 97.4 F (36.3 C) Resp: 18 BP: (!) 146/78 SpO2: 97 %  EBL: None  Complications: No immediate post-treatment complications observed by team, or reported by patient.  Note: The patient tolerated the entire procedure well. A repeat set of vitals were taken after the procedure and the patient was kept under observation following institutional policy, for this type of procedure. Post-procedural neurological assessment was performed, showing return to baseline, prior to discharge. The patient was provided with post-procedure discharge instructions, including a section on how to identify potential problems. Should any problems arise concerning this procedure, the patient was given instructions to immediately contact us, at any time, without hesitation. In any case, we plan to contact the patient by telephone for a follow-up status report regarding this interventional procedure.  Comments:  No additional relevant information.  Plan of  Care  Orders:  Orders Placed This Encounter  Procedures   PUMP REFILL    Maintain Protocol by having two(2) healthcare providers during procedure and programming.    Scheduling Instructions:     Please refill intrathecal pump today.    Order Specific Question:   Where will this procedure be performed?    Answer:   [facility]   PUMP REFILL    Whenever possible schedule on a procedure today.    Standing Status:   Future     Standing Expiration Date:   10/14/2020    Scheduling Instructions:     Please schedule intrathecal pump refill based on pump programming. Avoid schedule intervals of more than 120 days (4 months).    Order Specific Question:   Where will this procedure be performed?    Answer:   [facility]   Informed Consent Details: Physician/Practitioner Attestation; Transcribe to consent form and obtain patient signature    Transcribe to consent form and obtain patient signature.    Order Specific Question:   Physician/Practitioner attestation of informed consent for procedure/surgical case    Answer:   I, the physician/practitioner, attest that I have discussed with the patient the benefits, risks, side effects, alternatives, likelihood of achieving goals and potential problems during recovery for the procedure that I have provided informed consent.    Order Specific Question:   Procedure    Answer:   Intrathecal pump refill    Order Specific Question:   Physician/Practitioner performing the procedure    Answer:   Attending Physician: Kathlen Brunswick. Dossie Arbour, MD & designated trained staff    Order Specific Question:   Indication/Reason    Answer:   Chronic Pain Syndrome (G89.4), presence of an intrathecal pump (Z97.8)   Chronic Opioid Analgesic:  Oxycodone IR 10 mg, 1 tab PO q 6 hrs (40 mg/day of oxycodone)+ 52.9 mcg/hr intrathecal PF-Fentanyl (1,268.8 mcg/day of Fentanyl) MME/day:27m/day(from oral medication)+ 126.96 mg/day (from intrathecal fentanyl) = 186.96MME/day(Aprox. 1.08MME/day/kg).   Medications ordered for procedure: Meds ordered this encounter  Medications   morphine (MS CONTIN) 30 MG 12 hr tablet    Sig: Take 1 tablet (30 mg total) by mouth every 12 (twelve) hours. Must last 30 days. Do not break tablet    Dispense:  60 tablet    Refill:  0    Chronic Pain: STOP Act (Not applicable) Fill 1 day early if closed on refill date. Avoid benzodiazepines within 8 hours of opioids    morphine (MS CONTIN) 30 MG 12 hr tablet    Sig: Take 1 tablet (30 mg total) by mouth every 12 (twelve) hours. Must last 30 days. Do not break tablet    Dispense:  60 tablet    Refill:  0    Chronic Pain: STOP Act (Not applicable) Fill 1 day early if closed on refill date. Avoid benzodiazepines within 8 hours of opioids   morphine (MS CONTIN) 30 MG 12 hr tablet    Sig: Take 1 tablet (30 mg total) by mouth every 12 (twelve) hours. Must last 30 days. Do not break tablet    Dispense:  60 tablet    Refill:  0    Chronic Pain: STOP Act (Not applicable) Fill 1 day early if closed on refill date. Avoid benzodiazepines within 8 hours of opioids   naloxone (NARCAN) 2 MG/2ML injection    Sig: Inject 1 mL (1 mg total) into the muscle as needed for up to 2 doses (for opioid overdose).  Inject content of syringe into thigh muscle. Call 911.    Dispense:  2 mL    Refill:  1    Please teach proper emergency use of device.   Medications administered: Jeneen Rinks B. Hirata had no medications administered during this visit.  See the medical record for exact dosing, route, and time of administration.  Follow-up plan:   Return for Pump Refill (Max:10mo.       Interventional management options: Considering: NOTE: NO RFA until BMI <35  Palliative/therapeuticintrathecal pump refill and maintenance Diagnostic left Genicular NB Possible left Genicular nerve RFA Possible bilateral lumbar facet RFA(NO Lumbar RFA until BMI <35) Diagnostic caudal ESI+epidurogram Possible Racz procedure Diagnostic bilateral IA shoulder joint injection Diagnostic bilateral suprascapular NB Possible bilateral suprascapular nerve RFA Diagnostic LESI vs TFESI   Palliative PRN treatment(s): Palliative/therapeuticintrathecal pump refill and maintenance Diagnostic bilateral lumbar facet block #2      Recent Visits Date Type Provider Dept  03/15/20 Procedure visit NMilinda Pointer MD [facility]   Showing recent visits within past 90 days and meeting all other requirements Today's Visits Date Type Provider Dept  05/17/20 Procedure visit NMilinda Pointer MD [facility]  Showing today's visits and meeting all other requirements Future Appointments Date Type Provider Dept  07/19/20 Appointment NMilinda Pointer MD [facility]  Showing future appointments within next 90 days and meeting all other requirements  Disposition: Discharge home  Discharge (Date   Time): 05/17/2020; 1325 hrs.   Primary Care Physician: SAntony Contras MD [facility] Note by: FGaspar Cola MD Date: 05/17/2020; Time: 2:18 PM  Disclaimer:  Medicine is not an eChief Strategy Officer The only guarantee in medicine is that nothing is guaranteed. It is important to note that the decision to proceed with this intervention was based on the information collected from the patient. The Data and conclusions were drawn from the patient's questionnaire, the interview, and the physical examination. Because the information was provided in large part by the patient, it cannot be guaranteed that it has not been purposely or unconsciously manipulated. Every effort has been made to obtain as much relevant data as possible for this evaluation. It is important to note that the conclusions that lead to this procedure are derived in large part from the available data. Always take into account that the treatment will also be dependent on availability of resources and existing treatment guidelines, considered by other Pain Management Practitioners as being common knowledge and practice, at the time of the intervention. For Medico-Legal purposes, it is also important to point out that variation in procedural techniques and pharmacological choices are the acceptable norm. The indications, contraindications, technique, and results of the above procedure should only be interpreted and judged  by a Board-Certified Interventional Pain Specialist with extensive familiarity and expertise in the same exact procedure and technique.

## 2020-05-17 ENCOUNTER — Ambulatory Visit: Payer: No Typology Code available for payment source | Attending: Pain Medicine | Admitting: Pain Medicine

## 2020-05-17 ENCOUNTER — Other Ambulatory Visit: Payer: Self-pay

## 2020-05-17 ENCOUNTER — Encounter: Payer: Self-pay | Admitting: Pain Medicine

## 2020-05-17 VITALS — BP 146/78 | HR 122 | Temp 97.4°F | Resp 18 | Ht 71.0 in | Wt 360.0 lb

## 2020-05-17 DIAGNOSIS — M79605 Pain in left leg: Secondary | ICD-10-CM | POA: Insufficient documentation

## 2020-05-17 DIAGNOSIS — M5137 Other intervertebral disc degeneration, lumbosacral region: Secondary | ICD-10-CM | POA: Insufficient documentation

## 2020-05-17 DIAGNOSIS — Z79891 Long term (current) use of opiate analgesic: Secondary | ICD-10-CM

## 2020-05-17 DIAGNOSIS — G894 Chronic pain syndrome: Secondary | ICD-10-CM

## 2020-05-17 DIAGNOSIS — M961 Postlaminectomy syndrome, not elsewhere classified: Secondary | ICD-10-CM

## 2020-05-17 DIAGNOSIS — F1120 Opioid dependence, uncomplicated: Secondary | ICD-10-CM | POA: Diagnosis not present

## 2020-05-17 DIAGNOSIS — Z451 Encounter for adjustment and management of infusion pump: Secondary | ICD-10-CM

## 2020-05-17 DIAGNOSIS — F1190 Opioid use, unspecified, uncomplicated: Secondary | ICD-10-CM

## 2020-05-17 DIAGNOSIS — Z9689 Presence of other specified functional implants: Secondary | ICD-10-CM | POA: Diagnosis not present

## 2020-05-17 DIAGNOSIS — Z888 Allergy status to other drugs, medicaments and biological substances status: Secondary | ICD-10-CM | POA: Insufficient documentation

## 2020-05-17 DIAGNOSIS — M545 Low back pain, unspecified: Secondary | ICD-10-CM

## 2020-05-17 DIAGNOSIS — M51379 Other intervertebral disc degeneration, lumbosacral region without mention of lumbar back pain or lower extremity pain: Secondary | ICD-10-CM

## 2020-05-17 DIAGNOSIS — M47816 Spondylosis without myelopathy or radiculopathy, lumbar region: Secondary | ICD-10-CM

## 2020-05-17 DIAGNOSIS — M79604 Pain in right leg: Secondary | ICD-10-CM | POA: Insufficient documentation

## 2020-05-17 DIAGNOSIS — Z79899 Other long term (current) drug therapy: Secondary | ICD-10-CM

## 2020-05-17 DIAGNOSIS — G8929 Other chronic pain: Secondary | ICD-10-CM

## 2020-05-17 DIAGNOSIS — Z978 Presence of other specified devices: Secondary | ICD-10-CM

## 2020-05-17 MED ORDER — MORPHINE SULFATE ER 30 MG PO TBCR: 30.0000 mg | EXTENDED_RELEASE_TABLET | Freq: Two times a day (BID) | ORAL | 0 refills | Status: DC

## 2020-05-17 MED ORDER — NALOXONE HCL 2 MG/2ML IJ SOSY
1.0000 mg | PREFILLED_SYRINGE | INTRAMUSCULAR | 1 refills | Status: DC | PRN
Start: 2020-05-17 — End: 2021-05-17

## 2020-05-17 NOTE — Patient Instructions (Addendum)
Opioid Overdose Opioids are drugs that are often used to treat pain. Opioids include illegal drugs, such as heroin, as well as prescription pain medicines, such as codeine, morphine, hydrocodone, oxycodone, and fentanyl. An opioid overdose happens when you take too much of an opioid. An overdose may be intentional or accidental and can happen with any type of opioid. The effects of an overdose can be mild, dangerous, or even deadly. Opioid overdose is a medical emergency. What are the causes? This condition may be caused by:  Taking too much of an opioid on purpose.  Taking too much of an opioid by accident.  Using two or more substances that contain opioids at the same time.  Taking an opioid with a substance that affects your heart, breathing, or blood pressure. These include alcohol, tranquilizers, sleeping pills, illegal drugs, and some over-the-counter medicines. This condition may also happen due to an error made by:  A health care provider who prescribes a medicine.  The pharmacist who fills the prescription order. What increases the risk? This condition is more likely in:  Children. They may be attracted to colorful pills. Because of a child's small size, even a small amount of a drug can be dangerous.  Older people. They may be taking many different drugs. Older people may have difficulty reading labels or remembering when they last took their medicine. They may also be more sensitive to the effects of opioids.  People with chronic medical conditions, especially heart, liver, kidney, or neurological diseases.  People who take an opioid for a long period of time.  People who use: ? Illegal drugs. IV heroin is especially dangerous. ? Other substances, including alcohol, while using an opioid.  People who have: ? A history of drug or alcohol abuse. ? Certain mental health conditions. ? A history of previous drug overdoses.  People who take opioids that are not prescribed  for them. What are the signs or symptoms? Symptoms of this condition depend on the type of opioid and the amount that was taken. Common symptoms include:  Sleepiness or difficulty waking from sleep.  Decrease in attention.  Confusion.  Slurred speech.  Slowed breathing and a slow pulse (bradycardia).  Nausea and vomiting.  Abnormally small pupils. Signs and symptoms that require emergency treatment include:  Cold, clammy, and pale skin.  Blue lips and fingernails.  Vomiting.  Gurgling sounds in the throat.  A pulse that is very slow or difficult to detect.  Breathing that is very irregular, slow, noisy, or difficult to detect.  Limp body.  Inability to respond to speech or be awakened from sleep (stupor).  Seizures. How is this diagnosed? This condition is diagnosed based on your symptoms and medical history. It is important to tell your health care provider:  About all of the opioids that you took.  When you took the opioids.  Whether you were drinking alcohol or using marijuana, cocaine, or other drugs. Your health care provider will do a physical exam. This exam may include:  Checking and monitoring your heart rate and rhythm, breathing rate, temperature, and blood pressure (vital signs).  Measuring oxygen levels in your blood.  Checking for abnormally small pupils. You may also have blood tests or urine tests. You may have X-rays if you are having severe breathing problems. How is this treated? This condition requires immediate medical treatment and hospitalization. Treatment is given in the hospital intensive care (ICU) setting. Supporting your vital signs and your breathing is the first step in   treating an opioid overdose. Treatment may also include:  Giving salts and minerals (electrolytes) along with fluids through an IV.  Inserting a breathing tube (endotracheal tube) in your airway to help you breathe if you cannot breathe on your own or you are in  danger of not being able to breathe on your own.  Giving oxygen through a small tube under your nose.  Passing a tube through your nose and into your stomach (nasogastric tube, or NG tube) to empty your stomach.  Giving medicines that: ? Increase your blood pressure. ? Relieve nausea and vomiting. ? Relieve abdominal pain and cramping. ? Reverse the effects of the opioid (naloxone).  Monitoring your heart and oxygen levels.  Ongoing counseling and mental health support if you intentionally overdosed or used an illegal drug. Follow these instructions at home: Medicines  Take over-the-counter and prescription medicines only as told by your health care provider.  Always ask your health care provider about possible side effects and interactions of any new medicine that you start taking.  Keep a list of all the medicines that you take, including over-the-counter medicines. Bring this list with you to all your medical visits. General instructions  Drink enough fluid to keep your urine pale yellow.  Keep all follow-up visits as told by your health care provider. This is important.   How is this prevented?  Read the drug inserts that come with your opioid pain medicines.  Take medicines only as told by your health care provider. Do not take more medicine than you are told. Do not take medicines more frequently than you are told.  Do not drink alcohol or take sedatives when taking opioids.  Do not use illegal or recreational drugs, including cocaine, ecstasy, and marijuana.  Do not take opioid medicines that are not prescribed for you.  Store all medicines in safety containers that are out of the reach of children.  Get help if you are struggling with: ? Alcohol or drug use. ? Depression or another mental health problem. ? Thoughts of hurting yourself or another person.  Keep the phone number of your local poison control center near your phone or in your mobile phone. In the  [place] the hotline of the National Poison Control Center is (800) 222-1222.  If you were prescribed naloxone, make sure you understand how to take it. Contact a health care provider if you:  Need help understanding how to take your pain medicines.  Feel your medicines are too strong.  Are concerned that your pain medicines are not working well for your pain.  Develop new symptoms or side effects when you are taking medicines. Get help right away if:  You or someone else is having symptoms of an opioid overdose. Get help even if you are not sure.  You have serious thoughts about hurting yourself or others.  You have: ? Chest pain. ? Difficulty breathing. ? A loss of consciousness. These symptoms may represent a serious problem that is an emergency. Do not wait to see if the symptoms will go away. Get medical help right away. Call your local emergency services (911 in the [place] Do not drive yourself to the hospital. If you ever feel like you may hurt yourself or others, or have thoughts about taking your own life, get help right away. You can go to your nearest emergency department or call:  Your local emergency services (911 in the [place]  A suicide crisis helpline, such as the National Suicide Prevention Lifeline   at 1-800-273-8255. This is open 24 hours a day. Summary  Opioids are drugs that are often used to treat pain. Opioids include illegal drugs, such as heroin, as well as prescription pain medicines.  An opioid overdose happens when you take too much of an opioid.  Overdoses can be intentional or accidental.  Opioid overdose is very dangerous. It is a life-threatening emergency.  If you or someone you know is experiencing an opioid overdose, get help right away. This information is not intended to replace advice given to you by your health care provider. Make sure you discuss any questions you have with your health care provider. Document Revised: 03/18/2018 Document  Reviewed: 03/18/2018 Elsevier Patient Education  2021 [employer] Naloxone nasal spray What is this medicine? NALOXONE (nal OX one) is a narcotic blocker. It is used to treat narcotic drug overdose. This medicine may be used for other purposes; ask your health care provider or pharmacist if you have questions. COMMON BRAND NAME(S): Kloxxado, Narcan What should I tell my health care provider before I take this medicine? They need to know if you have any of these conditions:  drug abuse or addiction  heart disease  an unusual or allergic reaction to naloxone, other medicines, foods, dyes, or preservatives  pregnant or trying to get pregnant  breast-feeding How should I use this medicine? This medicine is for use in the nose. Lay the person on their back. Support their neck with your hand and allow the head to tilt back before giving the medicine. The nasal spray should be given into 1 nostril. After giving the medicine, move the person onto their side. Do not remove or test the nasal spray until ready to use. Get emergency medical help right away after giving the first dose of this medicine, even if the person wakes up. You should be familiar with how to recognize the signs and symptoms of a narcotic overdose. If more doses are needed, give the additional dose in the other nostril. Talk to your pediatrician regarding the use of this medicine in children. While this drug may be prescribed for children as young as newborns for selected conditions, precautions do apply. Overdosage: If you think you have taken too much of this medicine contact a poison control center or emergency room at once. NOTE: This medicine is only for you. Do not share this medicine with others. What if I miss a dose? This does not apply. What may interact with this medicine? This medicine is only used during an emergency. No interactions are expected during emergency use. This list may not describe all possible  interactions. Give your health care provider a list of all the medicines, herbs, non-prescription drugs, or dietary supplements you use. Also tell them if you smoke, drink alcohol, or use illegal drugs. Some items may interact with your medicine. What should I watch for while using this medicine? Keep this medicine ready for use in the case of a narcotic overdose. Make sure that you have the phone number of your doctor or health care professional and local hospital ready. You may need to have additional doses of this medicine. Each nasal spray contains a single dose. Some emergencies may require additional doses. After use, bring the treated person to the nearest hospital or call 911. Make sure the treating health care professional knows that the person has received an injection of this medicine. You will receive additional instructions on what to do during and after use of this medicine before an   emergency occurs. What side effects may I notice from receiving this medicine? Side effects that you should report to your doctor or health care professional as soon as possible:  allergic reactions like skin rash, itching or hives, swelling of the face, lips, or tongue  breathing problems  fast, irregular heartbeat  high blood pressure  pain that was controlled by narcotic pain medicine  seizures Side effects that usually do not require medical attention (report to your doctor or health care professional if they continue or are bothersome):  anxious  chills  diarrhea  fever  headache  muscle pain  nausea, vomiting  nose irritation like dryness, congestion, and swelling  sweating This list may not describe all possible side effects. Call your doctor for medical advice about side effects. You may report side effects to FDA at 1-800-FDA-1088. Where should I keep my medicine? Keep out of the reach of children and pets. Store between 20 and 25 degrees C (68 and 77 degrees F). Do not  freeze. Throw away any unused medicine after the expiration date. Keep in original box until ready to use. NOTE: This sheet is a summary. It may not cover all possible information. If you have questions about this medicine, talk to your doctor, pharmacist, or health care provider.  2021 Elsevier/Gold Standard (2019-08-16 12:08:55) Naloxone injection What is this medicine? NALOXONE (nal OX one) is a narcotic blocker. It is used to treat narcotic (opioid) drug overdose. It is used to temporarily reverse the effects of opioid medicines. This medicine has no effect in people who are not taking opioid medicines. This medicine may be used for other purposes; ask your health care provider or pharmacist if you have questions. COMMON BRAND NAME(S): EVZIO, Narcan What should I tell my health care provider before I take this medicine? They need to know if you have any of these conditions:  drug abuse or addiction  heart disease  an unusual or allergic reaction to naloxone, other medicines, foods, dyes, or preservatives  pregnant or trying to get pregnantbreast-feeding How should I use this medicine? This medicine may be administered in a hospital, clinic, or can be used by the public to give aid to a person who has overdosed until emergency medical help is available. This medicine is for injection into the outer thigh. It can be injected through clothing if needed. Get emergency medical help right away after giving the first dose of this medicine, even if the person wakes up. You should be familiar with how to recognize the signs and symptoms of a narcotic overdose. Administer according to the printed instructions on the device label or the electronic voice instructions. You should practice using the Trainer injector before this medicine is needed. Talk to your pediatrician regarding the use of this medicine in children. While this drug may be prescribed for children as young as newborn for selected  conditions, precautions do apply. For infants less than 1 year of age, pinch the thigh muscle while administering. Overdosage: If you think you have taken too much of this medicine contact a poison control center or emergency room at once. NOTE: This medicine is only for you. Do not share this medicine with others. What if I miss a dose? This does not apply. What may interact with this medicine? This medicine is only used during an emergency. No interactions are expected during emergency use. This list may not describe all possible interactions. Give your health care provider a list of all the medicines, herbs, non-prescription   drugs, or dietary supplements you use. Also tell them if you smoke, drink alcohol, or use illegal drugs. Some items may interact with your medicine. What should I watch for while using this medicine? Keep this medicine ready for use in the case of a narcotic overdose. Make sure that you have the phone number of your doctor or health care professional and local hospital ready. You may need to have additional doses of this medicine. Each injector contains a single dose. Some emergencies may require additional doses. After use, bring the treated person to the nearest hospital or call 911. Make sure the treating health care professional knows that the person has received an injection of this medicine. You will receive additional instructions on what to do during and after use of this medicine before an emergency occurs. What side effects may I notice from receiving this medicine? Side effects that you should report to your doctor or health care professional as soon as possible:  allergic reactions like skin rash, itching or hives, swelling of the face, lips, or tongue  breathing problems  fast, irregular heartbeat  high blood pressure  pain that was controlled by narcotic pain medicine  seizures Side effects that usually do not require medical attention (report to your  doctor or health care professional if they continue or are bothersome):  anxious  chills  diarrhea  fever  nausea, vomiting  sweating This list may not describe all possible side effects. Call your doctor for medical advice about side effects. You may report side effects to FDA at 1-800-FDA-1088. Where should I keep my medicine? Keep out of the reach of children. Store at room temperature between 15 and 25 degrees C (59 and 77 degrees F). If you are using this medicine at home, you will be instructed on how to store this medicine. Keep this medicine in its outer case until ready to use. Occasionally check the solution through the viewing window of the injector. The solution should be clear. If it is discolored, cloudy, or contains solid particles, replace it with a new injector. Remember to check the expiration date of this medicine regularly. Throw away any unused medicine after the expiration date. NOTE: This sheet is a summary. It may not cover all possible information. If you have questions about this medicine, talk to your doctor, pharmacist, or health care provider.  2021 Elsevier/Gold Standard (2015-04-10 16:45:38)  

## 2020-05-18 ENCOUNTER — Telehealth: Payer: Self-pay | Admitting: *Deleted

## 2020-05-18 NOTE — Telephone Encounter (Signed)
No problems post IT pump fill. 

## 2020-06-07 ENCOUNTER — Ambulatory Visit: Payer: 59 | Admitting: Pain Medicine

## 2020-06-26 ENCOUNTER — Other Ambulatory Visit: Payer: Self-pay

## 2020-06-26 MED ORDER — PAIN MANAGEMENT IT PUMP REFILL: 1.0000 | Freq: Once | INTRATHECAL | 0 refills | Status: AC

## 2020-07-19 ENCOUNTER — Encounter: Payer: Self-pay | Admitting: Pain Medicine

## 2020-07-19 ENCOUNTER — Other Ambulatory Visit: Payer: Self-pay

## 2020-07-19 ENCOUNTER — Ambulatory Visit: Payer: No Typology Code available for payment source | Attending: Pain Medicine | Admitting: Pain Medicine

## 2020-07-19 VITALS — BP 137/81 | HR 108 | Temp 96.9°F | Resp 20 | Ht 71.0 in | Wt 360.0 lb

## 2020-07-19 DIAGNOSIS — M79605 Pain in left leg: Secondary | ICD-10-CM | POA: Diagnosis not present

## 2020-07-19 DIAGNOSIS — Z888 Allergy status to other drugs, medicaments and biological substances status: Secondary | ICD-10-CM | POA: Insufficient documentation

## 2020-07-19 DIAGNOSIS — G8929 Other chronic pain: Secondary | ICD-10-CM | POA: Insufficient documentation

## 2020-07-19 DIAGNOSIS — Z79891 Long term (current) use of opiate analgesic: Secondary | ICD-10-CM | POA: Diagnosis not present

## 2020-07-19 DIAGNOSIS — Z451 Encounter for adjustment and management of infusion pump: Secondary | ICD-10-CM | POA: Diagnosis not present

## 2020-07-19 DIAGNOSIS — M47816 Spondylosis without myelopathy or radiculopathy, lumbar region: Secondary | ICD-10-CM

## 2020-07-19 DIAGNOSIS — M961 Postlaminectomy syndrome, not elsewhere classified: Secondary | ICD-10-CM | POA: Diagnosis not present

## 2020-07-19 DIAGNOSIS — Z6841 Body Mass Index (BMI) 40.0 and over, adult: Secondary | ICD-10-CM | POA: Diagnosis not present

## 2020-07-19 DIAGNOSIS — E662 Morbid (severe) obesity with alveolar hypoventilation: Secondary | ICD-10-CM

## 2020-07-19 DIAGNOSIS — F1120 Opioid dependence, uncomplicated: Secondary | ICD-10-CM

## 2020-07-19 DIAGNOSIS — M79604 Pain in right leg: Secondary | ICD-10-CM | POA: Insufficient documentation

## 2020-07-19 DIAGNOSIS — Z978 Presence of other specified devices: Secondary | ICD-10-CM

## 2020-07-19 DIAGNOSIS — M545 Low back pain, unspecified: Secondary | ICD-10-CM

## 2020-07-19 DIAGNOSIS — Z79899 Other long term (current) drug therapy: Secondary | ICD-10-CM

## 2020-07-19 DIAGNOSIS — M5137 Other intervertebral disc degeneration, lumbosacral region: Secondary | ICD-10-CM | POA: Diagnosis not present

## 2020-07-19 DIAGNOSIS — M5459 Other low back pain: Secondary | ICD-10-CM | POA: Insufficient documentation

## 2020-07-19 DIAGNOSIS — G894 Chronic pain syndrome: Secondary | ICD-10-CM | POA: Diagnosis not present

## 2020-07-19 MED ORDER — KETOROLAC TROMETHAMINE 60 MG/2ML IM SOLN
60.0000 mg | Freq: Once | INTRAMUSCULAR | Status: AC
  Administered 2020-07-19: 60 mg via INTRAMUSCULAR

## 2020-07-19 MED ORDER — ORPHENADRINE CITRATE 30 MG/ML IJ SOLN
INTRAMUSCULAR | Status: AC
  Filled 2020-07-19: qty 2

## 2020-07-19 MED ORDER — MORPHINE SULFATE ER 30 MG PO TBCR: 30.0000 mg | EXTENDED_RELEASE_TABLET | Freq: Two times a day (BID) | ORAL | 0 refills | Status: DC

## 2020-07-19 MED ORDER — KETOROLAC TROMETHAMINE 60 MG/2ML IM SOLN
INTRAMUSCULAR | Status: AC
  Filled 2020-07-19: qty 2

## 2020-07-19 MED ORDER — ORPHENADRINE CITRATE 30 MG/ML IJ SOLN
60.0000 mg | Freq: Once | INTRAMUSCULAR | Status: AC
  Administered 2020-07-19: 60 mg via INTRAMUSCULAR

## 2020-07-19 NOTE — Progress Notes (Signed)
Nursing Pain Medication Assessment:  Safety precautions to be maintained throughout the outpatient stay will include: orient to surroundings, keep bed in low position, maintain call bell within reach at all times, provide assistance with transfer out of bed and ambulation.  Medication Inspection Compliance: Pill count conducted under aseptic conditions, in front of the patient. Neither the pills nor the bottle was removed from the patient's sight at any time. Once count was completed pills were immediately returned to the patient in their original bottle.  Medication: Morphine ER (MSContin) Pill/Patch Count: 43 of 60 pills remain Pill/Patch Appearance: Markings consistent with prescribed medication Bottle Appearance: Standard pharmacy container. Clearly labeled. Filled Date: 03/ 31/ 2022 Last Medication intake:  Today

## 2020-07-19 NOTE — Progress Notes (Signed)
PROVIDER NOTE: Information contained herein reflects review and annotations entered in association with encounter. Interpretation of such information and data should be left to medically-trained personnel. Information provided to patient can be located elsewhere in the medical record under "Patient Instructions". Document created using STT-dictation technology, any transcriptional errors that may result from process are unintentional.    Patient: [name]  Service Category: Procedure  Provider: Gaspar Cola, MD  DOB: [birth year]  DOS: 07/19/2020  Location: [facility]  MRN: [id number]  Setting: Ambulatory - outpatient  Referring Provider: Antony Contras, MD  Type: Established Patient  Specialty: Interventional Pain Management  PCP: Brian Contras, MD   Primary Reason for Visit: Interventional Pain Management Treatment. CC: Back Pain (low)  Procedure:          Intrathecal Drug Delivery System (IDDS):  Type: Reservoir Refill [phone]) No rate change Region: Abdominal Laterality: Left  Type of Pump: Medtronic Synchromed II Delivery Route: Intrathecal Type of Pain Treated: Neuropathic/Nociceptive Primary Medication Class: Opioid/opiate  Medication, Concentration, Infusion Program, & Delivery Rate: Please see scanned programming printout.   Indications: 1. Chronic pain syndrome   2. Chronic low back pain (Primary Area of Pain) (Bilateral) (R>L)   3. Chronic lower extremity pain (Secondary area of Pain) (Bilateral) (L>R)   4. DDD (degenerative disc disease), lumbosacral   5. Failed back surgical syndrome   6. Lumbar facet syndrome (Bilateral) (R>L)   7. Pharmacologic therapy   8. Presence of intrathecal pump   9. Encounter for adjustment or management of infusion pump   10. Chronic use of opiate for therapeutic purpose   11. Uncomplicated opioid dependence ([place])   12. Class 3 obesity with alveolar hypoventilation, serious comorbidity, and body mass index (BMI) of 50.0  to 59.9 in adult [facility])    Pain Assessment: Self-Reported Pain Score: 7 /10             Reported level is compatible with observation.         RTCB: 11/02/2020 Nonopioid transfered 01/24/2020: Zanaflex, Flexeril, Nexium, Lyrica, Amitiza, calcium, and vitamin D3.  Pharmacotherapy Assessment  Analgesic: Oxycodone IR 10 mg, 1 tab PO q 6 hrs (40 mg/day of oxycodone)+ 52.9 mcg/hr intrathecal PF-Fentanyl (1,268.8 mcg/day of Fentanyl) MME/day:68m/day(from oral medication)+ 126.96 mg/day (from intrathecal fentanyl) = 186.96MME/day(Aprox. 1.08MME/day/kg).   Monitoring: [place] PMP: PDMP reviewed during this encounter.       Pharmacotherapy: No side-effects or adverse reactions reported. Compliance: No problems identified. Effectiveness: Clinically acceptable. Plan: Refer to "POC".  UDS:  Summary  Date Value Ref Range Status  10/24/2019 Note  Final    Comment:    ==================================================================== [employer] Select 13 (MW) ==================================================================== Test                             Result       Flag       Units  Drug Present and Declared for Prescription Verification   Fentanyl                       40           EXPECTED   ng/mg creat   Norfentanyl                    56           EXPECTED   ng/mg creat    Source of fentanyl is a scheduled prescription medication, including  IV, patch, and transmucosal formulations. Norfentanyl is an expected    metabolite of fentanyl.  Drug Absent but Declared for Prescription Verification   Oxycodone                      Not Detected UNEXPECTED ng/mg creat ==================================================================== Test                      Result    Flag   Units      Ref Range   Creatinine              91               mg/dL      >=20 ==================================================================== Declared Medications:  The flagging and interpretation on  this report are based on the  following declared medications.  Unexpected results may arise from  inaccuracies in the declared medications.   **Note: The testing scope of this panel includes these medications:   Fentanyl  Oxycodone   **Note: The testing scope of this panel does not include the  following reported medications:   Acetaminophen (Tylenol)  Aspirin  Baclofen  Bupivacaine  Calcium  Cyclobenzaprine (Flexeril)  Escitalopram (Lexapro)  Esomeprazole (Nexium)  Ibuprofen (Advil)  Lubiprostone (Amitiza)  Metolazone (Zaroxolyn)  Potassium  Pregabalin (Lyrica)  Promethazine (Phenergan)  Rosuvastatin (Crestor)  Tizanidine (Zanaflex)  Torsemide (Demadex)  Vitamin D3 ==================================================================== For clinical consultation, please call [phone]. ====================================================================     Intrathecal Pump Therapy Assessment  Manufacturer: Medtronic Synchromed Type: Programmable Volume: 40 mL reservoir MRI compatibility: Yes   Drug content:  Primary Medication Class:Opioid Primary Medication:PF-Fentanyl(2,000.0 mcg/ml) Secondary Medication:PF-Bupivacaine(30.18m/mL) Other Medication:PF-Baclofen (200 mcg/mL)   Programming:  Type: Simple continuous. See pump readout for details.   Changes:  Medication Change: None at this point Rate Change: No change in rate  Reported side-effects or adverse reactions: None reported  Effectiveness: Described as relatively effective, allowing for increase in activities of daily living (ADL) Clinically meaningful improvement in function (CMIF): Sustained CMIF goals met  Plan: Pump refill today  Pre-op H&P Assessment:  [name] a 55y.o. (year old), male patient, seen today for interventional treatment. He  has a past surgical history that includes Back surgery; morphine pump; Tonsillectomy; Joint replacement; Shoulder arthroscopy with  subacromial decompression, rotator cuff repair and bicep tendon repair (Right, 08/18/2013); Shoulder arthroscopy with subacromial decompression and bicep tendon repair (Left, 10/06/2013); Laparoscopic gastric sleeve resection (N/A, 01/08/2015); and RIGHT HEART CATH (N/A, 09/22/2019). Mr. HHurhas a current medication list which includes the following prescription(s): acetaminophen, aspirin ec, calcium carbonate, vitamin d3, cyclobenzaprine, escitalopram, esomeprazole, gabapentin, ibuprofen, metolazone, [START ON 08/04/2020] morphine, [START ON 09/03/2020] morphine, [START ON 10/03/2020] morphine, naloxone, NON FORMULARY, potassium chloride, pregabalin, promethazine, rosuvastatin, tizanidine, and torsemide. His primarily concern today is the Back Pain (low)  Initial Vital Signs:  Pulse/HCG Rate: (!) 108  Temp: (!) 96.9 F (36.1 C) Resp: 20 BP: 137/81 SpO2: 100 %  BMI: Estimated body mass index is 50.21 kg/m as calculated from the following:   Height as of this encounter: 5' 11" (1.803 m).   Weight as of this encounter: 360 lb (163.3 kg).  Risk Assessment: Allergies: Reviewed. He is allergic to tape.  Allergy Precautions: None required Coagulopathies: Reviewed. None identified.  Blood-thinner therapy: None at this time Active Infection(s): Reviewed. None identified. Mr. HAmsleris afebrile  Site Confirmation: Mr. HKnipplewas asked to confirm the procedure and laterality before marking the site Procedure  checklist: Completed Consent: Before the procedure and under the influence of no sedative(s), amnesic(s), or anxiolytics, the patient was informed of the treatment options, risks and possible complications. To fulfill our ethical and legal obligations, as recommended by the American Medical Association's Code of Ethics, I have informed the patient of my clinical impression; the nature and purpose of the treatment or procedure; the risks, benefits, and possible complications of the intervention; the  alternatives, including doing nothing; the risk(s) and benefit(s) of the alternative treatment(s) or procedure(s); and the risk(s) and benefit(s) of doing nothing.  Mr. Hartt was provided with information about the general risks and possible complications associated with most interventional procedures. These include, but are not limited to: failure to achieve desired goals, infection, bleeding, organ or nerve damage, allergic reactions, paralysis, and/or death.  In addition, he was informed of those risks and possible complications associated to this particular procedure, which include, but are not limited to: damage to the implant; failure to decrease pain; local, systemic, or serious CNS infections, intraspinal abscess with possible cord compression and paralysis, or life-threatening such as meningitis; bleeding; organ damage; nerve injury or damage with subsequent sensory, motor, and/or autonomic system dysfunction, resulting in transient or permanent pain, numbness, and/or weakness of one or several areas of the body; allergic reactions, either minor or major life-threatening, such as anaphylactic or anaphylactoid reactions.  Furthermore, Mr. Brakebill was informed of those risks and complications associated with the medications. These include, but are not limited to: allergic reactions (i.e.: anaphylactic or anaphylactoid reactions); endorphine suppression; bradycardia and/or hypotension; water retention and/or peripheral vascular relaxation leading to lower extremity edema and possible stasis ulcers; respiratory depression and/or shortness of breath; decreased metabolic rate leading to weight gain; swelling or edema; medication-induced neural toxicity; particulate matter embolism and blood vessel occlusion with resultant organ, and/or nervous system infarction; and/or intrathecal granuloma formation with possible spinal cord compression and permanent paralysis.  Before refilling the pump Mr. Burright  was informed that some of the medications used in the devise may not be FDA approved for such use and therefore it constitutes an off-label use of the medications.  Finally, he was informed that Medicine is not an exact science; therefore, there is also the possibility of unforeseen or unpredictable risks and/or possible complications that may result in a catastrophic outcome. The patient indicated having understood very clearly. We have given the patient no guarantees and we have made no promises. Enough time was given to the patient to ask questions, all of which were answered to the patient's satisfaction. Mr. Xia has indicated that he wanted to continue with the procedure. Attestation: I, the ordering provider, attest that I have discussed with the patient the benefits, risks, side-effects, alternatives, likelihood of achieving goals, and potential problems during recovery for the procedure that I have provided informed consent. Date  Time: 07/19/2020  1:06 PM  Pre-Procedure Preparation:  Monitoring: As per clinic protocol. Respiration, ETCO2, SpO2, BP, heart rate and rhythm monitor placed and checked for adequate function Safety Precautions: Patient was assessed for positional comfort and pressure points before starting the procedure. Time-out: I initiated and conducted the "Time-out" before starting the procedure, as per protocol. The patient was asked to participate by confirming the accuracy of the "Time Out" information. Verification of the correct person, site, and procedure were performed and confirmed by me, the nursing staff, and the patient. "Time-out" conducted as per Joint Commission's Universal Protocol (UP.01.01.01). Time: 1320  Description of Procedure:  Position: Supine Target Area: Central-port of intrathecal pump. Approach: Anterior, 90 degree angle approach. Area Prepped: Entire Area around the pump implant. DuraPrep (Iodine Povacrylex [0.7% available iodine] and  Isopropyl Alcohol, 74% w/w) Safety Precautions: Aspiration looking for blood return was conducted prior to all injections. At no point did we inject any substances, as a needle was being advanced. No attempts were made at seeking any paresthesias. Safe injection practices and needle disposal techniques used. Medications properly checked for expiration dates. SDV (single dose vial) medications used. Description of the Procedure: Protocol guidelines were followed. Two nurses trained to do implant refills were present during the entire procedure. The refill medication was checked by both healthcare providers as well as the patient. The patient was included in the "Time-out" to verify the medication. The patient was placed in position. The pump was identified. The area was prepped in the usual manner. The sterile template was positioned over the pump, making sure the side-port location matched that of the pump. Both, the pump and the template were held for stability. The needle provided in the Medtronic Kit was then introduced thru the center of the template and into the central port. The pump content was aspirated and discarded volume documented. The new medication was slowly infused into the pump, thru the filter, making sure to avoid overpressure of the device. The needle was then removed and the area cleansed, making sure to leave some of the prepping solution back to take advantage of its long term bactericidal properties. The pump was interrogated and programmed to reflect the correct medication, volume, and dosage. The program was printed and taken to the physician for approval. Once checked and signed by the physician, a copy was provided to the patient and another scanned into the EMR. Vitals:   07/19/20 1306  BP: 137/81  Pulse: (!) 108  Resp: 20  Temp: (!) 96.9 F (36.1 C)  SpO2: 100%  Weight: (!) 360 lb (163.3 kg)  Height: 5' 11" (1.803 m)    Start Time: 1320 hrs. End Time: 1350 hrs. Materials  & Medications: Medtronic Refill Kit Medication(s): Please see chart orders for details.  Imaging Guidance:          Type of Imaging Technique: None used Indication(s): N/A Exposure Time: No patient exposure Contrast: None used. Fluoroscopic Guidance: N/A Ultrasound Guidance: N/A Interpretation: N/A  Antibiotic Prophylaxis:   Anti-infectives (From admission, onward)   None     Indication(s): None identified  Post-operative Assessment:  Post-procedure Vital Signs:  Pulse/HCG Rate: (!) 108  Temp: (!) 96.9 F (36.1 C) Resp: 20 BP: 137/81 SpO2: 100 %  EBL: None  Complications: No immediate post-treatment complications observed by team, or reported by patient.  Note: The patient tolerated the entire procedure well. A repeat set of vitals were taken after the procedure and the patient was kept under observation following institutional policy, for this type of procedure. Post-procedural neurological assessment was performed, showing return to baseline, prior to discharge. The patient was provided with post-procedure discharge instructions, including a section on how to identify potential problems. Should any problems arise concerning this procedure, the patient was given instructions to immediately contact us, at any time, without hesitation. In any case, we plan to contact the patient by telephone for a follow-up status report regarding this interventional procedure.  Comments:  No additional relevant information.  Plan of Care  Orders:  Orders Placed This Encounter  Procedures  . PUMP REFILL    Maintain Protocol by having two(2)  healthcare providers during procedure and programming.    Scheduling Instructions:     Please refill intrathecal pump today.    Order Specific Question:   Where will this procedure be performed?    Answer:   [facility]  . PUMP REFILL    Whenever possible schedule on a procedure today.    Standing Status:   Future    Standing Expiration  Date:   12/16/2020    Scheduling Instructions:     Please schedule intrathecal pump refill based on pump programming. Avoid schedule intervals of more than 120 days (4 months).    Order Specific Question:   Where will this procedure be performed?    Answer:   [facility]  . Informed Consent Details: Physician/Practitioner Attestation; Transcribe to consent form and obtain patient signature    Transcribe to consent form and obtain patient signature.    Order Specific Question:   Physician/Practitioner attestation of informed consent for procedure/surgical case    Answer:   I, the physician/practitioner, attest that I have discussed with the patient the benefits, risks, side effects, alternatives, likelihood of achieving goals and potential problems during recovery for the procedure that I have provided informed consent.    Order Specific Question:   Procedure    Answer:   Intrathecal pump refill    Order Specific Question:   Physician/Practitioner performing the procedure    Answer:   Attending Physician: Kathlen Brunswick. Dossie Arbour, MD & designated trained staff    Order Specific Question:   Indication/Reason    Answer:   Chronic Pain Syndrome (G89.4), presence of an intrathecal pump (Z97.8)   Chronic Opioid Analgesic:  Oxycodone IR 10 mg, 1 tab PO q 6 hrs (40 mg/day of oxycodone)+ 52.9 mcg/hr intrathecal PF-Fentanyl (1,268.8 mcg/day of Fentanyl) MME/day:61m/day(from oral medication)+ 126.96 mg/day (from intrathecal fentanyl) = 186.96MME/day(Aprox. 1.08MME/day/kg).   Medications ordered for procedure: Meds ordered this encounter  Medications  . morphine (MS CONTIN) 30 MG 12 hr tablet    Sig: Take 1 tablet (30 mg total) by mouth every 12 (twelve) hours. Must last 30 days. Do not break tablet    Dispense:  60 tablet    Refill:  0    Not a duplicate. Do NOT delete! Dispense 1 day early if closed on refill date. Avoid benzodiazepines within 8 hours of opioids. Do not send refill  requests.  .Marland Kitchenmorphine (MS CONTIN) 30 MG 12 hr tablet    Sig: Take 1 tablet (30 mg total) by mouth every 12 (twelve) hours. Must last 30 days. Do not break tablet    Dispense:  60 tablet    Refill:  0    Not a duplicate. Do NOT delete! Dispense 1 day early if closed on refill date. Avoid benzodiazepines within 8 hours of opioids. Do not send refill requests.  .Marland Kitchenketorolac (TORADOL) injection 60 mg  . orphenadrine (NORFLEX) injection 60 mg   Medications administered: We administered ketorolac and orphenadrine.  See the medical record for exact dosing, route, and time of administration.  Follow-up plan:   Return for Pump Refill (Max:310mo       Interventional management options: Considering: NOTE: NO RFA until BMI <35  Palliative/therapeuticintrathecal pump refill and maintenance Diagnostic left Genicular NB Possible left Genicular nerve RFA Possible bilateral lumbar facet RFA(NO Lumbar RFA until BMI <35) Diagnostic caudal ESI+epidurogram Possible Racz procedure Diagnostic bilateral IA shoulder joint injection Diagnostic bilateral suprascapular NB Possible bilateral suprascapular nerve RFA Diagnostic LESI vs TFESI  Palliative PRN treatment(s): Palliative/therapeuticintrathecal pump refill and maintenance Diagnostic bilateral lumbar facet block #2       Recent Visits Date Type Provider Dept  05/17/20 Procedure visit Milinda Pointer, MD [facility]  Showing recent visits within past 90 days and meeting all other requirements Today's Visits Date Type Provider Dept  07/19/20 Procedure visit Milinda Pointer, MD [facility]  Showing today's visits and meeting all other requirements Future Appointments Date Type Provider Dept  09/20/20 Appointment Milinda Pointer, MD [facility]  Showing future appointments within next 90 days and meeting all other requirements  Disposition: Discharge home  Discharge (Date  Time):  07/19/2020; 1336 hrs.   Primary Care Physician: Brian Contras, MD Location: [facility] Note by: Brian Cola, MD Date: 07/19/2020; Time: 5:20 PM  Disclaimer:  Medicine is not an Chief Strategy Officer. The only guarantee in medicine is that nothing is guaranteed. It is important to note that the decision to proceed with this intervention was based on the information collected from the patient. The Data and conclusions were drawn from the patient's questionnaire, the interview, and the physical examination. Because the information was provided in large part by the patient, it cannot be guaranteed that it has not been purposely or unconsciously manipulated. Every effort has been made to obtain as much relevant data as possible for this evaluation. It is important to note that the conclusions that lead to this procedure are derived in large part from the available data. Always take into account that the treatment will also be dependent on availability of resources and existing treatment guidelines, considered by other Pain Management Practitioners as being common knowledge and practice, at the time of the intervention. For Medico-Legal purposes, it is also important to point out that variation in procedural techniques and pharmacological choices are the acceptable norm. The indications, contraindications, technique, and results of the above procedure should only be interpreted and judged by a Board-Certified Interventional Pain Specialist with extensive familiarity and expertise in the same exact procedure and technique.

## 2020-07-19 NOTE — Patient Instructions (Signed)
Opioid Overdose Opioids are drugs that are often used to treat pain. Opioids include illegal drugs, such as heroin, as well as prescription pain medicines, such as codeine, morphine, hydrocodone, oxycodone, and fentanyl. An opioid overdose happens when you take too much of an opioid. An overdose may be intentional or accidental and can happen with any type of opioid. The effects of an overdose can be mild, dangerous, or even deadly. Opioid overdose is a medical emergency. What are the causes? This condition may be caused by:  Taking too much of an opioid on purpose.  Taking too much of an opioid by accident.  Using two or more substances that contain opioids at the same time.  Taking an opioid with a substance that affects your heart, breathing, or blood pressure. These include alcohol, tranquilizers, sleeping pills, illegal drugs, and some over-the-counter medicines. This condition may also happen due to an error made by:  A health care provider who prescribes a medicine.  The pharmacist who fills the prescription order. What increases the risk? This condition is more likely in:  Children. They may be attracted to colorful pills. Because of a child's small size, even a small amount of a drug can be dangerous.  Older people. They may be taking many different drugs. Older people may have difficulty reading labels or remembering when they last took their medicine. They may also be more sensitive to the effects of opioids.  People with chronic medical conditions, especially heart, liver, kidney, or neurological diseases.  People who take an opioid for a long period of time.  People who use: ? Illegal drugs. IV heroin is especially dangerous. ? Other substances, including alcohol, while using an opioid.  People who have: ? A history of drug or alcohol abuse. ? Certain mental health conditions. ? A history of previous drug overdoses.  People who take opioids that are not prescribed  for them. What are the signs or symptoms? Symptoms of this condition depend on the type of opioid and the amount that was taken. Common symptoms include:  Sleepiness or difficulty waking from sleep.  Decrease in attention.  Confusion.  Slurred speech.  Slowed breathing and a slow pulse (bradycardia).  Nausea and vomiting.  Abnormally small pupils. Signs and symptoms that require emergency treatment include:  Cold, clammy, and pale skin.  Blue lips and fingernails.  Vomiting.  Gurgling sounds in the throat.  A pulse that is very slow or difficult to detect.  Breathing that is very irregular, slow, noisy, or difficult to detect.  Limp body.  Inability to respond to speech or be awakened from sleep (stupor).  Seizures. How is this diagnosed? This condition is diagnosed based on your symptoms and medical history. It is important to tell your health care provider:  About all of the opioids that you took.  When you took the opioids.  Whether you were drinking alcohol or using marijuana, cocaine, or other drugs. Your health care provider will do a physical exam. This exam may include:  Checking and monitoring your heart rate and rhythm, breathing rate, temperature, and blood pressure (vital signs).  Measuring oxygen levels in your blood.  Checking for abnormally small pupils. You may also have blood tests or urine tests. You may have X-rays if you are having severe breathing problems. How is this treated? This condition requires immediate medical treatment and hospitalization. Treatment is given in the hospital intensive care (ICU) setting. Supporting your vital signs and your breathing is the first step in   treating an opioid overdose. Treatment may also include:  Giving salts and minerals (electrolytes) along with fluids through an IV.  Inserting a breathing tube (endotracheal tube) in your airway to help you breathe if you cannot breathe on your own or you are in  danger of not being able to breathe on your own.  Giving oxygen through a small tube under your nose.  Passing a tube through your nose and into your stomach (nasogastric tube, or NG tube) to empty your stomach.  Giving medicines that: ? Increase your blood pressure. ? Relieve nausea and vomiting. ? Relieve abdominal pain and cramping. ? Reverse the effects of the opioid (naloxone).  Monitoring your heart and oxygen levels.  Ongoing counseling and mental health support if you intentionally overdosed or used an illegal drug. Follow these instructions at home: Medicines  Take over-the-counter and prescription medicines only as told by your health care provider.  Always ask your health care provider about possible side effects and interactions of any new medicine that you start taking.  Keep a list of all the medicines that you take, including over-the-counter medicines. Bring this list with you to all your medical visits. General instructions  Drink enough fluid to keep your urine pale yellow.  Keep all follow-up visits as told by your health care provider. This is important.   How is this prevented?  Read the drug inserts that come with your opioid pain medicines.  Take medicines only as told by your health care provider. Do not take more medicine than you are told. Do not take medicines more frequently than you are told.  Do not drink alcohol or take sedatives when taking opioids.  Do not use illegal or recreational drugs, including cocaine, ecstasy, and marijuana.  Do not take opioid medicines that are not prescribed for you.  Store all medicines in safety containers that are out of the reach of children.  Get help if you are struggling with: ? Alcohol or drug use. ? Depression or another mental health problem. ? Thoughts of hurting yourself or another person.  Keep the phone number of your local poison control center near your phone or in your mobile phone. In the  [place] the hotline of the National Poison Control Center is (800) 222-1222.  If you were prescribed naloxone, make sure you understand how to take it. Contact a health care provider if you:  Need help understanding how to take your pain medicines.  Feel your medicines are too strong.  Are concerned that your pain medicines are not working well for your pain.  Develop new symptoms or side effects when you are taking medicines. Get help right away if:  You or someone else is having symptoms of an opioid overdose. Get help even if you are not sure.  You have serious thoughts about hurting yourself or others.  You have: ? Chest pain. ? Difficulty breathing. ? A loss of consciousness. These symptoms may represent a serious problem that is an emergency. Do not wait to see if the symptoms will go away. Get medical help right away. Call your local emergency services (911 in the [place] Do not drive yourself to the hospital. If you ever feel like you may hurt yourself or others, or have thoughts about taking your own life, get help right away. You can go to your nearest emergency department or call:  Your local emergency services (911 in the [place]  A suicide crisis helpline, such as the National Suicide Prevention Lifeline   at 1-800-273-8255. This is open 24 hours a day. Summary  Opioids are drugs that are often used to treat pain. Opioids include illegal drugs, such as heroin, as well as prescription pain medicines.  An opioid overdose happens when you take too much of an opioid.  Overdoses can be intentional or accidental.  Opioid overdose is very dangerous. It is a life-threatening emergency.  If you or someone you know is experiencing an opioid overdose, get help right away. This information is not intended to replace advice given to you by your health care provider. Make sure you discuss any questions you have with your health care provider. Document Revised: 03/18/2018 Document  Reviewed: 03/18/2018 [employer] Patient Education  2021 [employer]  

## 2020-07-20 ENCOUNTER — Telehealth: Payer: Self-pay

## 2020-07-20 NOTE — Telephone Encounter (Signed)
Post procedure phone call. Patient states he is doing well.  

## 2020-07-24 MED FILL — Medication: INTRATHECAL | Qty: 1 | Status: AC

## 2020-08-22 ENCOUNTER — Other Ambulatory Visit: Payer: Self-pay

## 2020-08-22 MED ORDER — PAIN MANAGEMENT IT PUMP REFILL: 1.0000 | Freq: Once | INTRATHECAL | 0 refills | Status: AC

## 2020-09-18 NOTE — Progress Notes (Signed)
PROVIDER NOTE: Information contained herein reflects review and annotations entered in association with encounter. Interpretation of such information and data should be left to medically-trained personnel. Information provided to patient can be located elsewhere in the medical record under "Patient Instructions". Document created using STT-dictation technology, any transcriptional errors that may result from process are unintentional.    Patient: [name]  Service Category: Procedure  Provider: Gaspar Cola, MD  DOB: 1965/11/06  DOS: 09/20/2020  Location: [facility]  MRN: [id number]  Setting: Ambulatory - outpatient  Referring Provider: Antony Contras, MD  Type: Established Patient  Specialty: Interventional Pain Management  PCP: Brian Contras, MD   Primary Reason for Visit: Interventional Pain Management Treatment. CC: Back Pain (lower)  Procedure:          Intrathecal Drug Delivery System (IDDS):  Type: Reservoir Refill [phone]) No rate change Region: Abdominal Laterality: Left  Type of Pump: Medtronic Synchromed II Delivery Route: Intrathecal Type of Pain Treated: Neuropathic/Nociceptive Primary Medication Class: Opioid/opiate  Medication, Concentration, Infusion Program, & Delivery Rate: Please see scanned programming printout.   Indications: 1. Chronic pain syndrome   2. Chronic low back pain (Primary Area of Pain) (Bilateral) (R>L)   3. Chronic lower extremity pain (Secondary area of Pain) (Bilateral) (L>R)   4. DDD (degenerative disc disease), lumbosacral   5. Failed back surgical syndrome   6. Lumbar facet syndrome (Bilateral) (R>L)   7. Pharmacologic therapy   8. Presence of intrathecal pump   9. Encounter for adjustment or management of infusion pump   10. Chronic use of opiate for therapeutic purpose    Pain Assessment: Self-Reported Pain Score: 7 /10             Reported level is compatible with observation.        RTCB: 01/31/2021 Nonopioid  transfered 01/24/2020: Zanaflex, Flexeril, Nexium, Lyrica, Amitiza, calcium, and vitamin D3.  Pharmacotherapy Assessment  Analgesic: Oxycodone IR 10 mg, 1 tab PO q 6 hrs (40 mg/day of oxycodone) + 52.9 mcg/hr intrathecal PF-Fentanyl (1,268.8 mcg/day of Fentanyl) MME/day: 60 mg/day (from oral medication) + 126.96 mg/day (from intrathecal fentanyl) = 186.96 MME/day (Aprox. 1.08 MME/day/kg).   Monitoring: [place] PMP: PDMP reviewed during this encounter.       Pharmacotherapy: No side-effects or adverse reactions reported. Compliance:  Final warning given to the patient about a sure not bringing the prescriptions and pills to be counted. Effectiveness: Clinically acceptable. Plan: Refer to "POC".  UDS:  Summary  Date Value Ref Range Status  10/24/2019 Note  Final    Comment:    ==================================================================== ToxASSURE Select 13 (MW) ==================================================================== Test                             Result       Flag       Units  Drug Present and Declared for Prescription Verification   Fentanyl                       40           EXPECTED   ng/mg creat   Norfentanyl                    56           EXPECTED   ng/mg creat    Source of fentanyl is a scheduled prescription medication, including    IV, patch, and transmucosal  formulations. Norfentanyl is an expected    metabolite of fentanyl.  Drug Absent but Declared for Prescription Verification   Oxycodone                      Not Detected UNEXPECTED ng/mg creat ==================================================================== Test                      Result    Flag   Units      Ref Range   Creatinine              91               mg/dL      >=20 ==================================================================== Declared Medications:  The flagging and interpretation on this report are based on the  following declared medications.  Unexpected results may arise  from  inaccuracies in the declared medications.   **Note: The testing scope of this panel includes these medications:   Fentanyl  Oxycodone   **Note: The testing scope of this panel does not include the  following reported medications:   Acetaminophen (Tylenol)  Aspirin  Baclofen  Bupivacaine  Calcium  Cyclobenzaprine (Flexeril)  Escitalopram (Lexapro)  Esomeprazole (Nexium)  Ibuprofen (Advil)  Lubiprostone (Amitiza)  Metolazone (Zaroxolyn)  Potassium  Pregabalin (Lyrica)  Promethazine (Phenergan)  Rosuvastatin (Crestor)  Tizanidine (Zanaflex)  Torsemide (Demadex)  Vitamin D3 ==================================================================== For clinical consultation, please call [phone]. ====================================================================     Intrathecal Pump Therapy Assessment  Manufacturer: Medtronic Synchromed Type: Programmable Volume: 40 mL reservoir MRI compatibility: Yes   Drug content:  Primary Medication Class: Opioid Primary Medication: PF-Fentanyl (2,000.0 mcg/ml)  Secondary Medication: PF-Bupivacaine (30.0 mg/mL)  Other Medication: PF-Baclofen (200 mcg/mL)   Programming:  Type: Simple continuous. See pump readout for details.   Changes:  Medication Change: None at this point Rate Change: No change in rate  Reported side-effects or adverse reactions: None reported  Effectiveness: Described as relatively effective, allowing for increase in activities of daily living (ADL) Clinically meaningful improvement in function (CMIF): Sustained CMIF goals met  Plan: Pump refill today  Pre-op H&P Assessment:  [name] is a 55 y.o. (year old), male patient, seen today for interventional treatment. He  has a past surgical history that includes Back surgery; morphine pump; Tonsillectomy; Joint replacement; Shoulder arthroscopy with subacromial decompression, rotator cuff repair and bicep tendon repair (Right, 08/18/2013); Shoulder  arthroscopy with subacromial decompression and bicep tendon repair (Left, 10/06/2013); Laparoscopic gastric sleeve resection (N/A, 01/08/2015); and RIGHT HEART CATH (N/A, 09/22/2019). [name] has a current medication list which includes the following prescription(s): acetaminophen, aspirin ec, escitalopram, gabapentin, ibuprofen, metolazone, [START ON 10/03/2020] morphine, naloxone, NON FORMULARY, potassium chloride, promethazine, rosuvastatin, torsemide, calcium carbonate, vitamin d3, cyclobenzaprine, esomeprazole, [START ON 11/02/2020] morphine, [START ON 12/02/2020] morphine, [START ON 01/01/2021] morphine, pregabalin, and tizanidine. His primarily concern today is the Back Pain (lower)  Initial Vital Signs:  Pulse/HCG Rate: (!) 118  Temp: (!) 96.1 F (35.6 C) Resp: 18 BP: 140/66 SpO2: 100 %  BMI: Estimated body mass index is 48.82 kg/m as calculated from the following:   Height as of this encounter: '5\' 11"'  (1.803 m).   Weight as of this encounter: 350 lb (158.8 kg).  Risk Assessment: Allergies: Reviewed. He is allergic to tape.  Allergy Precautions: None required Coagulopathies: Reviewed. None identified.  Blood-thinner therapy: None at this time Active Infection(s): Reviewed. None identified. Mr. Zumbro is afebrile  Site Confirmation: Mr. Lang was asked to confirm  the procedure and laterality before marking the site Procedure checklist: Completed Consent: Before the procedure and under the influence of no sedative(s), amnesic(s), or anxiolytics, the patient was informed of the treatment options, risks and possible complications. To fulfill our ethical and legal obligations, as recommended by the American Medical Association's Code of Ethics, I have informed the patient of my clinical impression; the nature and purpose of the treatment or procedure; the risks, benefits, and possible complications of the intervention; the alternatives, including doing nothing; the risk(s) and benefit(s)  of the alternative treatment(s) or procedure(s); and the risk(s) and benefit(s) of doing nothing.  Mr. Meno was provided with information about the general risks and possible complications associated with most interventional procedures. These include, but are not limited to: failure to achieve desired goals, infection, bleeding, organ or nerve damage, allergic reactions, paralysis, and/or death.  In addition, he was informed of those risks and possible complications associated to this particular procedure, which include, but are not limited to: damage to the implant; failure to decrease pain; local, systemic, or serious CNS infections, intraspinal abscess with possible cord compression and paralysis, or life-threatening such as meningitis; bleeding; organ damage; nerve injury or damage with subsequent sensory, motor, and/or autonomic system dysfunction, resulting in transient or permanent pain, numbness, and/or weakness of one or several areas of the body; allergic reactions, either minor or major life-threatening, such as anaphylactic or anaphylactoid reactions.  Furthermore, Mr. Whisenant was informed of those risks and complications associated with the medications. These include, but are not limited to: allergic reactions (i.e.: anaphylactic or anaphylactoid reactions); endorphine suppression; bradycardia and/or hypotension; water retention and/or peripheral vascular relaxation leading to lower extremity edema and possible stasis ulcers; respiratory depression and/or shortness of breath; decreased metabolic rate leading to weight gain; swelling or edema; medication-induced neural toxicity; particulate matter embolism and blood vessel occlusion with resultant organ, and/or nervous system infarction; and/or intrathecal granuloma formation with possible spinal cord compression and permanent paralysis.  Before refilling the pump Mr. Rubinstein was informed that some of the medications used in the devise may not  be FDA approved for such use and therefore it constitutes an off-label use of the medications.  Finally, he was informed that Medicine is not an exact science; therefore, there is also the possibility of unforeseen or unpredictable risks and/or possible complications that may result in a catastrophic outcome. The patient indicated having understood very clearly. We have given the patient no guarantees and we have made no promises. Enough time was given to the patient to ask questions, all of which were answered to the patient's satisfaction. Mr. Perine has indicated that he wanted to continue with the procedure. Attestation: I, the ordering provider, attest that I have discussed with the patient the benefits, risks, side-effects, alternatives, likelihood of achieving goals, and potential problems during recovery for the procedure that I have provided informed consent. Date  Time: 09/20/2020 12:56 PM  Pre-Procedure Preparation:  Monitoring: As per clinic protocol. Respiration, ETCO2, SpO2, BP, heart rate and rhythm monitor placed and checked for adequate function Safety Precautions: Patient was assessed for positional comfort and pressure points before starting the procedure. Time-out: I initiated and conducted the "Time-out" before starting the procedure, as per protocol. The patient was asked to participate by confirming the accuracy of the "Time Out" information. Verification of the correct person, site, and procedure were performed and confirmed by me, the nursing staff, and the patient. "Time-out" conducted as per Joint Commission's Universal Protocol (UP.01.01.01). Time: 1300  Description of Procedure:          Position: Supine Target Area: Central-port of intrathecal pump. Approach: Anterior, 90 degree angle approach. Area Prepped: Entire Area around the pump implant. DuraPrep (Iodine Povacrylex [0.7% available iodine] and Isopropyl Alcohol, 74% w/w) Safety Precautions: Aspiration looking for  blood return was conducted prior to all injections. At no point did we inject any substances, as a needle was being advanced. No attempts were made at seeking any paresthesias. Safe injection practices and needle disposal techniques used. Medications properly checked for expiration dates. SDV (single dose vial) medications used. Description of the Procedure: Protocol guidelines were followed. Two nurses trained to do implant refills were present during the entire procedure. The refill medication was checked by both healthcare providers as well as the patient. The patient was included in the "Time-out" to verify the medication. The patient was placed in position. The pump was identified. The area was prepped in the usual manner. The sterile template was positioned over the pump, making sure the side-port location matched that of the pump. Both, the pump and the template were held for stability. The needle provided in the Medtronic Kit was then introduced thru the center of the template and into the central port. The pump content was aspirated and discarded volume documented. The new medication was slowly infused into the pump, thru the filter, making sure to avoid overpressure of the device. The needle was then removed and the area cleansed, making sure to leave some of the prepping solution back to take advantage of its long term bactericidal properties. The pump was interrogated and programmed to reflect the correct medication, volume, and dosage. The program was printed and taken to the physician for approval. Once checked and signed by the physician, a copy was provided to the patient and another scanned into the EMR. Vitals:   09/20/20 1255  BP: 140/66  Pulse: (!) 118  Resp: 18  Temp: (!) 96.1 F (35.6 C)  TempSrc: Temporal  SpO2: 100%  Weight: (!) 350 lb (158.8 kg)  Height: '5\' 11"'  (1.803 m)    Start Time: 1300 hrs. End Time: 1315 hrs. Materials & Medications: Medtronic Refill Kit Medication(s):  Please see chart orders for details.  Imaging Guidance:          Type of Imaging Technique: None used Indication(s): N/A Exposure Time: No patient exposure Contrast: None used. Fluoroscopic Guidance: N/A Ultrasound Guidance: N/A Interpretation: N/A  Antibiotic Prophylaxis:   Anti-infectives (From admission, onward)    None      Indication(s): None identified  Post-operative Assessment:  Post-procedure Vital Signs:  Pulse/HCG Rate: (!) 118  Temp: (!) 96.1 F (35.6 C) Resp: 18 BP: 140/66 SpO2: 100 %  EBL: None  Complications: No immediate post-treatment complications observed by team, or reported by patient.  Note: The patient tolerated the entire procedure well. A repeat set of vitals were taken after the procedure and the patient was kept under observation following institutional policy, for this type of procedure. Post-procedural neurological assessment was performed, showing return to baseline, prior to discharge. The patient was provided with post-procedure discharge instructions, including a section on how to identify potential problems. Should any problems arise concerning this procedure, the patient was given instructions to immediately contact us, at any time, without hesitation. In any case, we plan to contact the patient by telephone for a follow-up status report regarding this interventional procedure.  Comments:  No additional relevant information.  Plan of Care  Orders:  Orders  Placed This Encounter  Procedures   PUMP REFILL    Maintain Protocol by having two(2) healthcare providers during procedure and programming.    Scheduling Instructions:     Please refill intrathecal pump today.    Order Specific Question:   Where will this procedure be performed?    Answer:   [facility]   PUMP REFILL    Whenever possible schedule on a procedure today.    Standing Status:   Future    Standing Expiration Date:   02/17/2021    Scheduling Instructions:      Please schedule intrathecal pump refill based on pump programming. Avoid schedule intervals of more than 120 days (4 months).    Order Specific Question:   Where will this procedure be performed?    Answer:   [facility]   ToxASSURE Select 13 (MW), Urine    Volume: 30 ml(s). Minimum 3 ml of urine is needed. Document temperature of fresh sample. Indications: Long term (current) use of opiate analgesic [phone])    Order Specific Question:   Release to patient    Answer:   Immediate   Informed Consent Details: Physician/Practitioner Attestation; Transcribe to consent form and obtain patient signature    Transcribe to consent form and obtain patient signature.    Order Specific Question:   Physician/Practitioner attestation of informed consent for procedure/surgical case    Answer:   I, the physician/practitioner, attest that I have discussed with the patient the benefits, risks, side effects, alternatives, likelihood of achieving goals and potential problems during recovery for the procedure that I have provided informed consent.    Order Specific Question:   Procedure    Answer:   Intrathecal pump refill    Order Specific Question:   Physician/Practitioner performing the procedure    Answer:   Attending Physician: Kathlen Brunswick. Dossie Arbour, MD & designated trained staff    Order Specific Question:   Indication/Reason    Answer:   Chronic Pain Syndrome (G89.4), presence of an intrathecal pump (Z97.8)    Chronic Opioid Analgesic:  Oxycodone IR 10 mg, 1 tab PO q 6 hrs (40 mg/day of oxycodone) + 52.9 mcg/hr intrathecal PF-Fentanyl (1,268.8 mcg/day of Fentanyl) MME/day: 60 mg/day (from oral medication) + 126.96 mg/day (from intrathecal fentanyl) = 186.96 MME/day (Aprox. 1.08 MME/day/kg).   Medications ordered for procedure: Meds ordered this encounter  Medications   morphine (MS CONTIN) 30 MG 12 hr tablet    Sig: Take 1 tablet (30 mg total) by mouth every 12 (twelve) hours. Must last 30  days. Do not break tablet    Dispense:  60 tablet    Refill:  0    Not a duplicate. Do NOT delete! Dispense 1 day early if closed on refill date. Avoid benzodiazepines within 8 hours of opioids. Do not send refill requests.   morphine (MS CONTIN) 30 MG 12 hr tablet    Sig: Take 1 tablet (30 mg total) by mouth every 12 (twelve) hours. Must last 30 days. Do not break tablet    Dispense:  60 tablet    Refill:  0    Not a duplicate. Do NOT delete! Dispense 1 day early if closed on refill date. Avoid benzodiazepines within 8 hours of opioids. Do not send refill requests.   morphine (MS CONTIN) 30 MG 12 hr tablet    Sig: Take 1 tablet (30 mg total) by mouth every 12 (twelve) hours. Must last 30 days. Do not break tablet  Dispense:  60 tablet    Refill:  0    Not a duplicate. Do NOT delete! Dispense 1 day early if closed on refill date. Avoid benzodiazepines within 8 hours of opioids. Do not send refill requests.    Medications administered: Jeneen Rinks B. Douville had no medications administered during this visit.  See the medical record for exact dosing, route, and time of administration.  Follow-up plan:   Return for Pump Refill (Max:22mo.       Interventional management options: Considering:   NOTE: NO RFA until BMI <35  Palliative/therapeutic intrathecal pump refill and maintenance  Diagnostic left Genicular NB  Possible left Genicular nerve RFA  Possible bilateral lumbar facet RFA (NO Lumbar RFA until BMI <35) Diagnostic caudal ESI + epidurogram  Possible Racz procedure  Diagnostic bilateral IA shoulder joint injection  Diagnostic bilateral suprascapular NB  Possible bilateral suprascapular nerve RFA  Diagnostic LESI vs TFESI    Palliative PRN treatment(s):   Palliative/therapeutic intrathecal pump refill and maintenance  Diagnostic bilateral lumbar facet block #2         Recent Visits Date Type Provider Dept  07/19/20 Procedure visit NMilinda Pointer MD [facility]   Showing recent visits within past 90 days and meeting all other requirements Today's Visits Date Type Provider Dept  09/20/20 Procedure visit NMilinda Pointer MD [facility]  Showing today's visits and meeting all other requirements Future Appointments Date Type Provider Dept  11/22/20 Appointment NMilinda Pointer MD [facility]  Showing future appointments within next 90 days and meeting all other requirements Disposition: Discharge home  Discharge (Date  Time): 09/20/2020;   hrs.   Primary Care Physician: SAntony Contras MD [facility] Note by: FGaspar Cola MD Date: 09/20/2020; Time: 4:44 PM  Disclaimer:  Medicine is not an eChief Strategy Officer The only guarantee in medicine is that nothing is guaranteed. It is important to note that the decision to proceed with this intervention was based on the information collected from the patient. The Data and conclusions were drawn from the patient's questionnaire, the interview, and the physical examination. Because the information was provided in large part by the patient, it cannot be guaranteed that it has not been purposely or unconsciously manipulated. Every effort has been made to obtain as much relevant data as possible for this evaluation. It is important to note that the conclusions that lead to this procedure are derived in large part from the available data. Always take into account that the treatment will also be dependent on availability of resources and existing treatment guidelines, considered by other Pain Management Practitioners as being common knowledge and practice, at the time of the intervention. For Medico-Legal purposes, it is also important to point out that variation in procedural techniques and pharmacological choices are the acceptable norm. The indications, contraindications, technique, and results of the above procedure should only be interpreted and judged by a  Board-Certified Interventional Pain Specialist with extensive familiarity and expertise in the same exact procedure and technique.

## 2020-09-19 ENCOUNTER — Other Ambulatory Visit: Payer: Self-pay | Admitting: Pulmonary Disease

## 2020-09-19 DIAGNOSIS — G4733 Obstructive sleep apnea (adult) (pediatric): Secondary | ICD-10-CM

## 2020-09-20 ENCOUNTER — Ambulatory Visit: Payer: No Typology Code available for payment source | Attending: Pain Medicine | Admitting: Pain Medicine

## 2020-09-20 ENCOUNTER — Other Ambulatory Visit: Payer: Self-pay

## 2020-09-20 ENCOUNTER — Encounter: Payer: Self-pay | Admitting: Pain Medicine

## 2020-09-20 VITALS — BP 140/66 | HR 118 | Temp 96.1°F | Resp 18 | Ht 71.0 in | Wt 350.0 lb

## 2020-09-20 DIAGNOSIS — M5137 Other intervertebral disc degeneration, lumbosacral region: Secondary | ICD-10-CM

## 2020-09-20 DIAGNOSIS — G894 Chronic pain syndrome: Secondary | ICD-10-CM | POA: Insufficient documentation

## 2020-09-20 DIAGNOSIS — Z978 Presence of other specified devices: Secondary | ICD-10-CM | POA: Diagnosis not present

## 2020-09-20 DIAGNOSIS — M961 Postlaminectomy syndrome, not elsewhere classified: Secondary | ICD-10-CM | POA: Diagnosis not present

## 2020-09-20 DIAGNOSIS — M545 Low back pain, unspecified: Secondary | ICD-10-CM | POA: Insufficient documentation

## 2020-09-20 DIAGNOSIS — Z451 Encounter for adjustment and management of infusion pump: Secondary | ICD-10-CM | POA: Insufficient documentation

## 2020-09-20 DIAGNOSIS — G8929 Other chronic pain: Secondary | ICD-10-CM

## 2020-09-20 DIAGNOSIS — M79604 Pain in right leg: Secondary | ICD-10-CM | POA: Diagnosis not present

## 2020-09-20 DIAGNOSIS — Z79899 Other long term (current) drug therapy: Secondary | ICD-10-CM | POA: Diagnosis not present

## 2020-09-20 DIAGNOSIS — M47816 Spondylosis without myelopathy or radiculopathy, lumbar region: Secondary | ICD-10-CM

## 2020-09-20 DIAGNOSIS — Z79891 Long term (current) use of opiate analgesic: Secondary | ICD-10-CM

## 2020-09-20 DIAGNOSIS — M79605 Pain in left leg: Secondary | ICD-10-CM | POA: Diagnosis not present

## 2020-09-20 MED ORDER — MORPHINE SULFATE ER 30 MG PO TBCR: 30.0000 mg | EXTENDED_RELEASE_TABLET | Freq: Two times a day (BID) | ORAL | 0 refills | Status: DC

## 2020-09-20 NOTE — Patient Instructions (Signed)
Opioid Overdose Opioids are drugs that are often used to treat pain. Opioids include illegal drugs, such as heroin, as well as prescription pain medicines, such as codeine, morphine, hydrocodone, oxycodone, and fentanyl. An opioid overdose happens when you take too much of an opioid. An overdose may be intentional or accidental and can happen with any type of opioid. The effects of an overdose can be mild, dangerous, or even deadly. Opioid overdose is a medical emergency. What are the causes? This condition may be caused by:  Taking too much of an opioid on purpose.  Taking too much of an opioid by accident.  Using two or more substances that contain opioids at the same time.  Taking an opioid with a substance that affects your heart, breathing, or blood pressure. These include alcohol, tranquilizers, sleeping pills, illegal drugs, and some over-the-counter medicines. This condition may also happen due to an error made by:  A health care provider who prescribes a medicine.  The pharmacist who fills the prescription order. What increases the risk? This condition is more likely in:  Children. They may be attracted to colorful pills. Because of a child's small size, even a small amount of a drug can be dangerous.  Older people. They may be taking many different drugs. Older people may have difficulty reading labels or remembering when they last took their medicine. They may also be more sensitive to the effects of opioids.  People with chronic medical conditions, especially heart, liver, kidney, or neurological diseases.  People who take an opioid for a long period of time.  People who use: ? Illegal drugs. IV heroin is especially dangerous. ? Other substances, including alcohol, while using an opioid.  People who have: ? A history of drug or alcohol abuse. ? Certain mental health conditions. ? A history of previous drug overdoses.  People who take opioids that are not prescribed  for them. What are the signs or symptoms? Symptoms of this condition depend on the type of opioid and the amount that was taken. Common symptoms include:  Sleepiness or difficulty waking from sleep.  Decrease in attention.  Confusion.  Slurred speech.  Slowed breathing and a slow pulse (bradycardia).  Nausea and vomiting.  Abnormally small pupils. Signs and symptoms that require emergency treatment include:  Cold, clammy, and pale skin.  Blue lips and fingernails.  Vomiting.  Gurgling sounds in the throat.  A pulse that is very slow or difficult to detect.  Breathing that is very irregular, slow, noisy, or difficult to detect.  Limp body.  Inability to respond to speech or be awakened from sleep (stupor).  Seizures. How is this diagnosed? This condition is diagnosed based on your symptoms and medical history. It is important to tell your health care provider:  About all of the opioids that you took.  When you took the opioids.  Whether you were drinking alcohol or using marijuana, cocaine, or other drugs. Your health care provider will do a physical exam. This exam may include:  Checking and monitoring your heart rate and rhythm, breathing rate, temperature, and blood pressure (vital signs).  Measuring oxygen levels in your blood.  Checking for abnormally small pupils. You may also have blood tests or urine tests. You may have X-rays if you are having severe breathing problems. How is this treated? This condition requires immediate medical treatment and hospitalization. Treatment is given in the hospital intensive care (ICU) setting. Supporting your vital signs and your breathing is the first step in   treating an opioid overdose. Treatment may also include:  Giving salts and minerals (electrolytes) along with fluids through an IV.  Inserting a breathing tube (endotracheal tube) in your airway to help you breathe if you cannot breathe on your own or you are in  danger of not being able to breathe on your own.  Giving oxygen through a small tube under your nose.  Passing a tube through your nose and into your stomach (nasogastric tube, or NG tube) to empty your stomach.  Giving medicines that: ? Increase your blood pressure. ? Relieve nausea and vomiting. ? Relieve abdominal pain and cramping. ? Reverse the effects of the opioid (naloxone).  Monitoring your heart and oxygen levels.  Ongoing counseling and mental health support if you intentionally overdosed or used an illegal drug. Follow these instructions at home: Medicines  Take over-the-counter and prescription medicines only as told by your health care provider.  Always ask your health care provider about possible side effects and interactions of any new medicine that you start taking.  Keep a list of all the medicines that you take, including over-the-counter medicines. Bring this list with you to all your medical visits. General instructions  Drink enough fluid to keep your urine pale yellow.  Keep all follow-up visits as told by your health care provider. This is important.   How is this prevented?  Read the drug inserts that come with your opioid pain medicines.  Take medicines only as told by your health care provider. Do not take more medicine than you are told. Do not take medicines more frequently than you are told.  Do not drink alcohol or take sedatives when taking opioids.  Do not use illegal or recreational drugs, including cocaine, ecstasy, and marijuana.  Do not take opioid medicines that are not prescribed for you.  Store all medicines in safety containers that are out of the reach of children.  Get help if you are struggling with: ? Alcohol or drug use. ? Depression or another mental health problem. ? Thoughts of hurting yourself or another person.  Keep the phone number of your local poison control center near your phone or in your mobile phone. In the  [place] the hotline of the National Poison Control Center is (800) 222-1222.  If you were prescribed naloxone, make sure you understand how to take it. Contact a health care provider if you:  Need help understanding how to take your pain medicines.  Feel your medicines are too strong.  Are concerned that your pain medicines are not working well for your pain.  Develop new symptoms or side effects when you are taking medicines. Get help right away if:  You or someone else is having symptoms of an opioid overdose. Get help even if you are not sure.  You have serious thoughts about hurting yourself or others.  You have: ? Chest pain. ? Difficulty breathing. ? A loss of consciousness. These symptoms may represent a serious problem that is an emergency. Do not wait to see if the symptoms will go away. Get medical help right away. Call your local emergency services (911 in the [place] Do not drive yourself to the hospital. If you ever feel like you may hurt yourself or others, or have thoughts about taking your own life, get help right away. You can go to your nearest emergency department or call:  Your local emergency services (911 in the [place]  A suicide crisis helpline, such as the National Suicide Prevention Lifeline   at 1-800-273-8255. This is open 24 hours a day. Summary  Opioids are drugs that are often used to treat pain. Opioids include illegal drugs, such as heroin, as well as prescription pain medicines.  An opioid overdose happens when you take too much of an opioid.  Overdoses can be intentional or accidental.  Opioid overdose is very dangerous. It is a life-threatening emergency.  If you or someone you know is experiencing an opioid overdose, get help right away. This information is not intended to replace advice given to you by your health care provider. Make sure you discuss any questions you have with your health care provider. Document Revised: 03/18/2018 Document  Reviewed: 03/18/2018 [employer] Patient Education  2021 [employer]  

## 2020-09-20 NOTE — Progress Notes (Signed)
Nursing Pain Medication Assessment:  Safety precautions to be maintained throughout the outpatient stay will include: orient to surroundings, keep bed in low position, maintain call bell within reach at all times, provide assistance with transfer out of bed and ambulation.  Medication Inspection Compliance: [name] did not comply with our request to bring his pills to be counted. He was reminded that bringing the medication bottles, even when empty, is a requirement.  Medication: None brought in. Pill/Patch Count: None available to be counted. Bottle Appearance: No container available. Did not bring bottle(s) to appointment. Filled Date: N/A Last Medication intake:  Today

## 2020-09-21 ENCOUNTER — Telehealth: Payer: Self-pay

## 2020-09-21 NOTE — Telephone Encounter (Signed)
Denies any needs from pump refill. Instructed to call if needed. Patient with understanding.

## 2020-09-28 LAB — TOXASSURE SELECT 13 (MW), URINE

## 2020-10-05 MED FILL — Medication: INTRATHECAL | Qty: 1 | Status: AC

## 2020-10-17 ENCOUNTER — Other Ambulatory Visit: Payer: Self-pay

## 2020-10-17 MED ORDER — PAIN MANAGEMENT IT PUMP REFILL: 1.0000 | Freq: Once | INTRATHECAL | 0 refills | Status: AC

## 2020-11-19 NOTE — Progress Notes (Signed)
PROVIDER NOTE: Information contained herein reflects review and annotations entered in association with encounter. Interpretation of such information and data should be left to medically-trained personnel. Information provided to patient can be located elsewhere in the medical record under "Patient Instructions". Document created using STT-dictation technology, any transcriptional errors that may result from process are unintentional.    Patient: [name]  Service Category: Procedure  Provider: Gaspar Cola, MD  DOB: [birth year]  DOS: 11/22/2020  Location: [facility]  MRN: [id number]  Setting: Ambulatory - outpatient  Referring Provider: Antony Contras, MD  Type: Established Patient  Specialty: Interventional Pain Management  PCP: Antony Contras, MD   Primary Reason for Visit: Interventional Pain Management Treatment. CC: Back Pain  Procedure:          Intrathecal Drug Delivery System (IDDS):  Type: Reservoir Refill [phone]) No rate change Region: Abdominal Laterality: Left  Type of Pump: Medtronic Synchromed II Delivery Route: Intrathecal Type of Pain Treated: Neuropathic/Nociceptive Primary Medication Class: Opioid/opiate  Medication, Concentration, Infusion Program, & Delivery Rate: Please see scanned programming printout.   Indications: 1. Chronic pain syndrome   2. Chronic low back pain (Primary Area of Pain) (Bilateral) (R>L)   3. Chronic lower extremity pain (Secondary area of Pain) (Bilateral) (L>R)   4. DDD (degenerative disc disease), lumbosacral   5. Failed back surgical syndrome   6. Lumbar facet syndrome (Bilateral) (R>L)   7. Other intervertebral disc degeneration, lumbar region   8. Epidural fibrosis   9. Lumbosacral spondylosis with radiculopathy   10. Weakness of lower extremities (Bilateral)   11. Weakness of back   12. Physical deconditioning   13. Pharmacologic therapy   14. Presence of intrathecal pump   15. Physical tolerance to opiate  drug   16. Chronic use of opiate for therapeutic purpose   17. Encounter for medication management   18. Encounter for adjustment or management of infusion pump   19. Type 2 diabetes mellitus with complications ([place])   20. Type 2 diabetes mellitus with morbid obesity ([place])   21. Unable to ambulate   22. Obesity, morbid (more than 100 lbs over ideal weight or BMI > 40) (HCC)   23. Morbid obesity with BMI of 45.0-49.9, adult (HCC)    Pain Assessment: Self-Reported Pain Score: 8 /10             Reported level is compatible with observation.        The patient comes into the clinic today accompanied by his wife who has had to stop working in order to dedicate her time to assisting her husband with basic activities of daily living.  Apparently his condition has continued to worsen and this has adversely affected his ability to ambulate and do any kind of activities.  Although the patient is rather large, the amount of pain medication that he is consuming both orally and through the intrathecal pump should be sufficient to get this pain under control.  At this point we have serious concerns as to what may be going on with him and therefore today we will be ordering repeat lumbar MRIs to evaluate his spine and to determine if there is any new pathology that could explain his increase in the pain and decrease in his levels of activity.  At this point the patient's wife indicates that he is having difficulty with even getting out of bed.  Parenthesis-she has requested and I have agreed to order home physical therapy program to see if  we can help him with his level of deconditioning so as to be able to move on to a facility based physical therapy program.  In addition, I have also ordered an extrawide motorized wheelchair to assist him with mobility.  At this point I have absolutely no doubt in my mind that his morbid obesity is adversely contributing to his pain as he is unable to stand the weight of his body  on his deteriorating spine.  For this reason, I have also requested a referral to a medically supervised weight loss program as well as a referral to bariatric surgery since his condition has reached a level that it is imperative to aggressively work on bringing his BMI to a level close to 30 kg/m.  He is currently taking the scale at 340 pounds (154.2 kg) and a BMI of 47.42 kg/m.  In addition to this, I believe that we also need to do a nerve conduction test of the lower extremities to determine if we are dealing with a pure lower extremity radiculopathy affecting both lower extremities or if we are not dealing with a peripheral neuropathy secondary to his diabetes.  For some reason, he has not been given a formal diagnosis of diabetes, but in looking at his lab work, we see that he has had multiple labs showing a hemoglobin A1c at or above 6.5, in addition to multiple recordings of his blood sugar being elevated, including during fasting.  In addition to this, the patient seems to be exhibiting significant resistance and/short tolerance to opioids and therefore I believe it to be medically necessary to have him undergo genetic testing to determine if he is an ultra-rapid metabolizer or if he has any other conditions that may require a drastic switch to a different type of opioid analgesic.  At this point I am not willing to make any changes until I can see the results of the genetic testing.  In several locations we have shown the patient to be having problems with his facet joints and we have in fact done diagnostic nerve blocks that have proven this to be a fact.  However facet joint syndrome does not cause the degree of impairment that this patient appears to have.  At this point I believe it to be medically necessary for him to undergo these tests for the purpose of determining if there are in fact any physiological reasons why he is having the generalized weakness and level of impairment that he  appears to have.  Pharmacotherapy Assessment   Analgesic: Oxycodone IR 10 mg, 1 tab PO q 6 hrs (40 mg/day of oxycodone) + 52.9 mcg/hr intrathecal PF-Fentanyl (1,268.8 mcg/day of Fentanyl) MME/day: 60 mg/day (from oral medication) + 126.96 mg/day (from intrathecal fentanyl) = 186.96 MME/day (Aprox. 1.08 MME/day/kg).   Monitoring: [place] PMP: PDMP reviewed during this encounter.       Pharmacotherapy: No side-effects or adverse reactions reported. Compliance: No problems identified. Effectiveness: Clinically acceptable. Plan: Refer to "POC". UDS:  Summary  Date Value Ref Range Status  09/20/2020 Note  Final    Comment:    ==================================================================== [employer] Select 13 (MW) ==================================================================== Test                             Result       Flag       Units  Drug Present and Declared for Prescription Verification   Morphine  3885         EXPECTED   ng/mg creat   Normorphine                    68           EXPECTED   ng/mg creat    Potential sources of large amounts of morphine in the absence of    codeine include administration of morphine or use of heroin.     Normorphine is an expected metabolite of morphine.    Fentanyl                       29           EXPECTED   ng/mg creat   Norfentanyl                    185          EXPECTED   ng/mg creat    Source of fentanyl is a scheduled prescription medication, including    IV, patch, and transmucosal formulations. Norfentanyl is an expected    metabolite of fentanyl.  Drug Present not Declared for Prescription Verification   Hydromorphone                  212          UNEXPECTED ng/mg creat    Hydromorphone may be administered as a scheduled prescription    medication and is also an expected metabolite of hydrocodone.    Hydromorphone is also a minor metabolite of morphine which is also    present in this specimen.  Concentrations of hydromorphone rarely    exceed 5% of the morphine concentration when metabolism of morphine    is the sole source of hydromorphone.  ==================================================================== Test                      Result    Flag   Units      Ref Range   Creatinine              188              mg/dL      >=20 ==================================================================== Declared Medications:  The flagging and interpretation on this report are based on the  following declared medications.  Unexpected results may arise from  inaccuracies in the declared medications.   **Note: The testing scope of this panel includes these medications:   Fentanyl  Morphine   **Note: The testing scope of this panel does not include the  following reported medications:   Acetaminophen  Aspirin  Baclofen  Bupivacaine  Calcium  Cholecalciferol  Cyclobenzaprine  Escitalopram  Esomeprazole  Gabapentin  Ibuprofen  Metolazone  Naloxone  Potassium Chloride  Pregabalin  Promethazine  Rosuvastatin  Tizanidine  Torsemide ==================================================================== For clinical consultation, please call [phone]. ====================================================================      Intrathecal Pump Therapy Assessment  Manufacturer: Medtronic Synchromed Type: Programmable Volume: 40 mL reservoir MRI compatibility: Yes   Drug content:  Primary Medication Class: Opioid Primary Medication: PF-Fentanyl (2,000.0 mcg/ml)  Secondary Medication: PF-Bupivacaine (30.0 mg/mL)  Other Medication: PF-Baclofen (200 mcg/mL)   Programming:  Type: Simple continuous. See pump readout for details.   Changes:  Medication Change: None at this point Rate Change: No change in rate  Reported side-effects or adverse reactions: None reported  Effectiveness: Described as relatively effective, allowing for increase in activities of daily  living (ADL)  Clinically meaningful improvement in function (CMIF): Sustained CMIF goals met  Plan: Pump refill today  Pre-op H&P Assessment:  Mr. Durbin is a 55 y.o. (year old), male patient, seen today for interventional treatment. He  has a past surgical history that includes Back surgery; morphine pump; Tonsillectomy; Joint replacement; Shoulder arthroscopy with subacromial decompression, rotator cuff repair and bicep tendon repair (Right, 08/18/2013); Shoulder arthroscopy with subacromial decompression and bicep tendon repair (Left, 10/06/2013); Laparoscopic gastric sleeve resection (N/A, 01/08/2015); and RIGHT HEART CATH (N/A, 09/22/2019). Mr. Breck has a current medication list which includes the following prescription(s): acetaminophen, aspirin ec, escitalopram, gabapentin, ibuprofen, metolazone, [START ON 12/02/2020] morphine, [START ON 01/01/2021] morphine, naloxone, NON FORMULARY, potassium chloride, promethazine, rosuvastatin, torsemide, calcium carbonate, vitamin d3, cyclobenzaprine, esomeprazole, [START ON 01/31/2021] morphine, pregabalin, and tizanidine. His primarily concern today is the Back Pain  Initial Vital Signs:  Pulse/HCG Rate: (!) 142  Temp: (!) 96.5 F (35.8 C) Resp: 20 BP: 131/72 SpO2: 98 %  BMI: Estimated body mass index is 47.42 kg/m as calculated from the following:   Height as of this encounter: '5\' 11"'  (1.803 m).   Weight as of this encounter: 340 lb (154.2 kg).  Risk Assessment: Allergies: Reviewed. He is allergic to tape.  Allergy Precautions: None required Coagulopathies: Reviewed. None identified.  Blood-thinner therapy: None at this time Active Infection(s): Reviewed. None identified. Mr. Doren is afebrile  Site Confirmation: Mr. Sporer was asked to confirm the procedure and laterality before marking the site Procedure checklist: Completed Consent: Before the procedure and under the influence of no sedative(s), amnesic(s), or anxiolytics, the patient  was informed of the treatment options, risks and possible complications. To fulfill our ethical and legal obligations, as recommended by the American Medical Association's Code of Ethics, I have informed the patient of my clinical impression; the nature and purpose of the treatment or procedure; the risks, benefits, and possible complications of the intervention; the alternatives, including doing nothing; the risk(s) and benefit(s) of the alternative treatment(s) or procedure(s); and the risk(s) and benefit(s) of doing nothing.  Mr. Krist was provided with information about the general risks and possible complications associated with most interventional procedures. These include, but are not limited to: failure to achieve desired goals, infection, bleeding, organ or nerve damage, allergic reactions, paralysis, and/or death.  In addition, he was informed of those risks and possible complications associated to this particular procedure, which include, but are not limited to: damage to the implant; failure to decrease pain; local, systemic, or serious CNS infections, intraspinal abscess with possible cord compression and paralysis, or life-threatening such as meningitis; bleeding; organ damage; nerve injury or damage with subsequent sensory, motor, and/or autonomic system dysfunction, resulting in transient or permanent pain, numbness, and/or weakness of one or several areas of the body; allergic reactions, either minor or major life-threatening, such as anaphylactic or anaphylactoid reactions.  Furthermore, Mr. Pilger was informed of those risks and complications associated with the medications. These include, but are not limited to: allergic reactions (i.e.: anaphylactic or anaphylactoid reactions); endorphine suppression; bradycardia and/or hypotension; water retention and/or peripheral vascular relaxation leading to lower extremity edema and possible stasis ulcers; respiratory depression and/or shortness  of breath; decreased metabolic rate leading to weight gain; swelling or edema; medication-induced neural toxicity; particulate matter embolism and blood vessel occlusion with resultant organ, and/or nervous system infarction; and/or intrathecal granuloma formation with possible spinal cord compression and permanent paralysis.  Before refilling the pump Mr. Montenegro was informed that some of the medications used  in the devise may not be FDA approved for such use and therefore it constitutes an off-label use of the medications.  Finally, he was informed that Medicine is not an exact science; therefore, there is also the possibility of unforeseen or unpredictable risks and/or possible complications that may result in a catastrophic outcome. The patient indicated having understood very clearly. We have given the patient no guarantees and we have made no promises. Enough time was given to the patient to ask questions, all of which were answered to the patient's satisfaction. Mr. Rathke has indicated that he wanted to continue with the procedure. Attestation: I, the ordering provider, attest that I have discussed with the patient the benefits, risks, side-effects, alternatives, likelihood of achieving goals, and potential problems during recovery for the procedure that I have provided informed consent. Date  Time: 11/22/2020  1:27 PM  Pre-Procedure Preparation:  Monitoring: As per clinic protocol. Respiration, ETCO2, SpO2, BP, heart rate and rhythm monitor placed and checked for adequate function Safety Precautions: Patient was assessed for positional comfort and pressure points before starting the procedure. Time-out: I initiated and conducted the "Time-out" before starting the procedure, as per protocol. The patient was asked to participate by confirming the accuracy of the "Time Out" information. Verification of the correct person, site, and procedure were performed and confirmed by me, the nursing staff,  and the patient. "Time-out" conducted as per Joint Commission's Universal Protocol (UP.01.01.01). Time: 1406  Description of Procedure:          Position: Supine Target Area: Central-port of intrathecal pump. Approach: Anterior, 90 degree angle approach. Area Prepped: Entire Area around the pump implant. DuraPrep (Iodine Povacrylex [0.7% available iodine] and Isopropyl Alcohol, 74% w/w) Safety Precautions: Aspiration looking for blood return was conducted prior to all injections. At no point did we inject any substances, as a needle was being advanced. No attempts were made at seeking any paresthesias. Safe injection practices and needle disposal techniques used. Medications properly checked for expiration dates. SDV (single dose vial) medications used. Description of the Procedure: Protocol guidelines were followed. Two nurses trained to do implant refills were present during the entire procedure. The refill medication was checked by both healthcare providers as well as the patient. The patient was included in the "Time-out" to verify the medication. The patient was placed in position. The pump was identified. The area was prepped in the usual manner. The sterile template was positioned over the pump, making sure the side-port location matched that of the pump. Both, the pump and the template were held for stability. The needle provided in the Medtronic Kit was then introduced thru the center of the template and into the central port. The pump content was aspirated and discarded volume documented. The new medication was slowly infused into the pump, thru the filter, making sure to avoid overpressure of the device. The needle was then removed and the area cleansed, making sure to leave some of the prepping solution back to take advantage of its long term bactericidal properties. The pump was interrogated and programmed to reflect the correct medication, volume, and dosage. The program was printed and taken  to the physician for approval. Once checked and signed by the physician, a copy was provided to the patient and another scanned into the EMR.  Vitals:   11/22/20 1324  BP: 131/72  Pulse: (!) 142  Resp: 20  Temp: (!) 96.5 F (35.8 C)  SpO2: 98%  Weight: (!) 340 lb (154.2 kg)  Height:  '5\' 11"'  (1.803 m)    Start Time: 1406 hrs. End Time: 1420 hrs. Materials & Medications: Medtronic Refill Kit Medication(s): Please see chart orders for details.  Imaging Guidance:          Type of Imaging Technique: None used Indication(s): N/A Exposure Time: No patient exposure Contrast: None used. Fluoroscopic Guidance: N/A Ultrasound Guidance: N/A Interpretation: N/A  Antibiotic Prophylaxis:   Anti-infectives (From admission, onward)    None      Indication(s): None identified  Post-operative Assessment:  Post-procedure Vital Signs:  Pulse/HCG Rate: (!) 142  Temp: (!) 96.5 F (35.8 C) Resp: 20 BP: 131/72 SpO2: 98 %  EBL: None  Complications: No immediate post-treatment complications observed by team, or reported by patient.  Note: The patient tolerated the entire procedure well. A repeat set of vitals were taken after the procedure and the patient was kept under observation following institutional policy, for this type of procedure. Post-procedural neurological assessment was performed, showing return to baseline, prior to discharge. The patient was provided with post-procedure discharge instructions, including a section on how to identify potential problems. Should any problems arise concerning this procedure, the patient was given instructions to immediately contact us, at any time, without hesitation. In any case, we plan to contact the patient by telephone for a follow-up status report regarding this interventional procedure.  Comments:  No additional relevant information.  Plan of Care  Orders:  Orders Placed This Encounter  Procedures   PUMP REFILL    Maintain Protocol by  having two(2) healthcare providers during procedure and programming.    Scheduling Instructions:     Please refill intrathecal pump today.    Order Specific Question:   Where will this procedure be performed?    Answer:   [facility]   PUMP REFILL    Whenever possible schedule on a procedure today.    Standing Status:   Future    Standing Expiration Date:   04/20/2021    Scheduling Instructions:     Please schedule intrathecal pump refill based on pump programming. Avoid schedule intervals of more than 120 days (4 months).    Order Specific Question:   Where will this procedure be performed?    Answer:   [facility]   Wheelchair    Patient needs an extrawide motorized wheelchair.  Patient is morbidly obese at 340 pounds and a BMI of 47.42 kg/m.   MR LUMBAR SPINE W WO CONTRAST    Patient presents with axial pain with possible radicular component. Please assist [place] in identifying specific level(s) and laterality of any additional findings such as: 1. Facet (Zygapophyseal) joint DJD (Hypertrophy, space narrowing, subchondral sclerosis, and/or osteophyte formation) 2. DDD and/or IVDD (Loss of disc height, desiccation, gas patterns, osteophytes, endplate sclerosis, or "Black disc disease") 3. Pars defects 4. Spondylolisthesis, spondylosis, and/or spondyloarthropathies (include Degree/Grade of displacement in mm) (stability) 5. Vertebral body Fractures (acute/chronic) (state percentage of collapse) 6. Demineralization (osteopenia/osteoporotic) 7. Bone pathology 8. Foraminal narrowing  9. Surgical changes 10. Central, Lateral Recess, and/or Foraminal Stenosis (include AP diameter of stenosis in mm) 11. Surgical changes (hardware type, status, and presence of fibrosis) 12. Modic Type Changes (MRI only) 13. IVDD (Disc bulge, protrusion, herniation, extrusion) (Level, laterality, extent)    Standing Status:   Future    Standing Expiration Date:   12/23/2020    Scheduling  Instructions:     Imaging must be done as soon as possible. Inform patient that order will expire within  30 days and I will not renew it.    Order Specific Question:   If indicated for the ordered procedure, I authorize the administration of contrast media per Radiology protocol    Answer:   Yes    Order Specific Question:   What is the patient's sedation requirement?    Answer:   No Sedation    Order Specific Question:   Does the patient have a pacemaker or implanted devices?    Answer:   No    Order Specific Question:   Call Results- Best Contact Number?    Answer:   [phone] ([facility])    Order Specific Question:   Radiology Contrast Protocol - do NOT remove file path    Answer:   [email or web address]    Order Specific Question:   Preferred imaging location?    Answer:   [facility] (table limit-350lbs)   Cytochrome P450 2D6/2C19    Order Specific Question:   Release to patient    Answer:   Immediate   Cytochrome P450 2D6 Genotyping    Order Specific Question:   Release to patient    Answer:   Immediate   Ambulatory referral to Physical Therapy    Referral Priority:   Routine    Referral Type:   Physical Medicine    Referral Reason:   Specialty Services Required    Requested Specialty:   Physical Therapy    Number of Visits Requested:   1   Amb Ref to Medical Weight Management    Referral Priority:   Routine    Referral Type:   Consultation    Referral Reason:   Specialty Services Required    Number of Visits Requested:   1   Amb Referral to Bariatric Surgery    Referral Priority:   Routine    Referral Type:   Consultation    Referral Reason:   Specialty Services Required    Number of Visits Requested:   1   Informed Consent Details: Physician/Practitioner Attestation; Transcribe to consent form and obtain patient signature    Transcribe to consent form and obtain patient signature.    Order Specific Question:    Physician/Practitioner attestation of informed consent for procedure/surgical case    Answer:   I, the physician/practitioner, attest that I have discussed with the patient the benefits, risks, side effects, alternatives, likelihood of achieving goals and potential problems during recovery for the procedure that I have provided informed consent.    Order Specific Question:   Procedure    Answer:   Intrathecal pump refill    Order Specific Question:   Physician/Practitioner performing the procedure    Answer:   Attending Physician: Kathlen Brunswick. Dossie Arbour, MD & designated trained staff    Order Specific Question:   Indication/Reason    Answer:   Chronic Pain Syndrome (G89.4), presence of an intrathecal pump (Z97.8)   NCV with EMG(electromyography)    Bilateral testing requested.    Standing Status:   Future    Standing Expiration Date:   01/22/2021    Scheduling Instructions:     Please refer this patient to [facility] for Nerve Conduction testing of the lower extremities. (EMG & PNCV)    Order Specific Question:   Where should this test be performed?    Answer:   other    Order Specific Question:   Release to patient    Answer:   Immediate   Somatosensory test    Standing Status:  Future    Standing Expiration Date:   01/22/2021    Scheduling Instructions:     Please refer this patient to Neurology department for Somatosensory Evoked Potentials testing of the lower extremities. (SSEP)    Order Specific Question:   Release to patient    Answer:   Immediate    Chronic Opioid Analgesic:  Oxycodone IR 10 mg, 1 tab PO q 6 hrs (40 mg/day of oxycodone) + 52.9 mcg/hr intrathecal PF-Fentanyl (1,268.8 mcg/day of Fentanyl) MME/day: 60 mg/day (from oral medication) + 126.96 mg/day (from intrathecal fentanyl) = 186.96 MME/day (Aprox. 1.08 MME/day/kg).   Medications ordered for procedure: Meds ordered this encounter  Medications   morphine (MS CONTIN) 30 MG 12 hr tablet    Sig: Take  1 tablet (30 mg total) by mouth every 12 (twelve) hours. Must last 30 days. Do not break tablet    Dispense:  60 tablet    Refill:  0    Not a duplicate. Do NOT delete! Dispense 1 day early if closed on refill date. Avoid benzodiazepines within 8 hours of opioids. Do not send refill requests.    Medications administered: Jeneen Rinks B. Tritch had no medications administered during this visit.  See the medical record for exact dosing, route, and time of administration.  Follow-up plan:   Return for Pump Refill (Max:82mo.      Interventional management options: Considering:   NOTE: NO RFA until BMI <35  Palliative/therapeutic intrathecal pump refill and maintenance  Diagnostic left Genicular NB  Possible left Genicular nerve RFA  Possible bilateral lumbar facet RFA (NO Lumbar RFA until BMI <35) Diagnostic caudal ESI + epidurogram  Possible Racz procedure  Diagnostic bilateral IA shoulder joint injection  Diagnostic bilateral suprascapular NB  Possible bilateral suprascapular nerve RFA  Diagnostic LESI vs TFESI    Palliative PRN treatment(s):   Palliative/therapeutic intrathecal pump refill and maintenance  Diagnostic bilateral lumbar facet block #2          Recent Visits Date Type Provider Dept  09/20/20 Procedure visit NMilinda Pointer MD [facility]  Showing recent visits within past 90 days and meeting all other requirements Today's Visits Date Type Provider Dept  11/22/20 Procedure visit NMilinda Pointer MD [facility]  Showing today's visits and meeting all other requirements Future Appointments Date Type Provider Dept  01/22/21 Appointment NMilinda Pointer MD [facility]  01/28/21 Appointment NMilinda Pointer MD [facility]  Showing future appointments within next 90 days and meeting all other requirements Disposition: Discharge home  Discharge (Date  Time): 11/22/2020; 1418 hrs.   Primary Care Physician: SAntony Contras  MD [facility] Note by: FGaspar Cola MD Date: 11/22/2020; Time: 6:49 PM  Disclaimer:  Medicine is not an eChief Strategy Officer The only guarantee in medicine is that nothing is guaranteed. It is important to note that the decision to proceed with this intervention was based on the information collected from the patient. The Data and conclusions were drawn from the patient's questionnaire, the interview, and the physical examination. Because the information was provided in large part by the patient, it cannot be guaranteed that it has not been purposely or unconsciously manipulated. Every effort has been made to obtain as much relevant data as possible for this evaluation. It is important to note that the conclusions that lead to this procedure are derived in large part from the available data. Always take into account that the treatment will also be dependent on availability of  resources and existing treatment guidelines, considered by other Pain Management Practitioners as being common knowledge and practice, at the time of the intervention. For Medico-Legal purposes, it is also important to point out that variation in procedural techniques and pharmacological choices are the acceptable norm. The indications, contraindications, technique, and results of the above procedure should only be interpreted and judged by a Board-Certified Interventional Pain Specialist with extensive familiarity and expertise in the same exact procedure and technique.

## 2020-11-22 ENCOUNTER — Other Ambulatory Visit: Payer: Self-pay

## 2020-11-22 ENCOUNTER — Ambulatory Visit: Payer: No Typology Code available for payment source | Attending: Pain Medicine | Admitting: Pain Medicine

## 2020-11-22 ENCOUNTER — Encounter: Payer: Self-pay | Admitting: *Deleted

## 2020-11-22 ENCOUNTER — Encounter: Payer: Self-pay | Admitting: Pain Medicine

## 2020-11-22 VITALS — BP 131/72 | HR 142 | Temp 96.5°F | Resp 20 | Ht 71.0 in | Wt 340.0 lb

## 2020-11-22 DIAGNOSIS — M79605 Pain in left leg: Secondary | ICD-10-CM | POA: Insufficient documentation

## 2020-11-22 DIAGNOSIS — Z978 Presence of other specified devices: Secondary | ICD-10-CM

## 2020-11-22 DIAGNOSIS — Z888 Allergy status to other drugs, medicaments and biological substances status: Secondary | ICD-10-CM | POA: Diagnosis not present

## 2020-11-22 DIAGNOSIS — M5136 Other intervertebral disc degeneration, lumbar region: Secondary | ICD-10-CM | POA: Diagnosis not present

## 2020-11-22 DIAGNOSIS — M545 Low back pain, unspecified: Secondary | ICD-10-CM | POA: Diagnosis not present

## 2020-11-22 DIAGNOSIS — Z79899 Other long term (current) drug therapy: Secondary | ICD-10-CM | POA: Diagnosis not present

## 2020-11-22 DIAGNOSIS — M5137 Other intervertebral disc degeneration, lumbosacral region: Secondary | ICD-10-CM | POA: Diagnosis not present

## 2020-11-22 DIAGNOSIS — M47816 Spondylosis without myelopathy or radiculopathy, lumbar region: Secondary | ICD-10-CM | POA: Insufficient documentation

## 2020-11-22 DIAGNOSIS — E1169 Type 2 diabetes mellitus with other specified complication: Secondary | ICD-10-CM

## 2020-11-22 DIAGNOSIS — Z5189 Encounter for other specified aftercare: Secondary | ICD-10-CM

## 2020-11-22 DIAGNOSIS — R29898 Other symptoms and signs involving the musculoskeletal system: Secondary | ICD-10-CM

## 2020-11-22 DIAGNOSIS — G96198 Other disorders of meninges, not elsewhere classified: Secondary | ICD-10-CM

## 2020-11-22 DIAGNOSIS — G894 Chronic pain syndrome: Secondary | ICD-10-CM

## 2020-11-22 DIAGNOSIS — M961 Postlaminectomy syndrome, not elsewhere classified: Secondary | ICD-10-CM | POA: Diagnosis not present

## 2020-11-22 DIAGNOSIS — Z79891 Long term (current) use of opiate analgesic: Secondary | ICD-10-CM | POA: Insufficient documentation

## 2020-11-22 DIAGNOSIS — M4727 Other spondylosis with radiculopathy, lumbosacral region: Secondary | ICD-10-CM | POA: Insufficient documentation

## 2020-11-22 DIAGNOSIS — Z451 Encounter for adjustment and management of infusion pump: Secondary | ICD-10-CM | POA: Diagnosis present

## 2020-11-22 DIAGNOSIS — M79604 Pain in right leg: Secondary | ICD-10-CM | POA: Diagnosis not present

## 2020-11-22 DIAGNOSIS — E119 Type 2 diabetes mellitus without complications: Secondary | ICD-10-CM | POA: Diagnosis not present

## 2020-11-22 DIAGNOSIS — R5381 Other malaise: Secondary | ICD-10-CM

## 2020-11-22 DIAGNOSIS — G8929 Other chronic pain: Secondary | ICD-10-CM | POA: Insufficient documentation

## 2020-11-22 DIAGNOSIS — F1190 Opioid use, unspecified, uncomplicated: Secondary | ICD-10-CM

## 2020-11-22 DIAGNOSIS — Z6841 Body Mass Index (BMI) 40.0 and over, adult: Secondary | ICD-10-CM | POA: Diagnosis not present

## 2020-11-22 DIAGNOSIS — E1142 Type 2 diabetes mellitus with diabetic polyneuropathy: Secondary | ICD-10-CM | POA: Insufficient documentation

## 2020-11-22 DIAGNOSIS — M5117 Intervertebral disc disorders with radiculopathy, lumbosacral region: Secondary | ICD-10-CM | POA: Diagnosis not present

## 2020-11-22 DIAGNOSIS — E118 Type 2 diabetes mellitus with unspecified complications: Secondary | ICD-10-CM

## 2020-11-22 DIAGNOSIS — R262 Difficulty in walking, not elsewhere classified: Secondary | ICD-10-CM

## 2020-11-22 MED ORDER — MORPHINE SULFATE ER 30 MG PO TBCR: 30.0000 mg | EXTENDED_RELEASE_TABLET | Freq: Two times a day (BID) | ORAL | 0 refills | Status: DC

## 2020-11-22 NOTE — Progress Notes (Signed)
Safety precautions to be maintained throughout the outpatient stay will include: orient to surroundings, keep bed in low position, maintain call bell within reach at all times, provide assistance with transfer out of bed and ambulation.  Nursing Pain Medication Assessment:  Safety precautions to be maintained throughout the outpatient stay will include: orient to surroundings, keep bed in low position, maintain call bell within reach at all times, provide assistance with transfer out of bed and ambulation.  Medication Inspection Compliance: Pill count conducted under aseptic conditions, in front of the patient. Neither the pills nor the bottle was removed from the patient's sight at any time. Once count was completed pills were immediately returned to the patient in their original bottle.  Medication: See above Pill/Patch Count:  51 of 60 pills remain Pill/Patch Appearance: Markings consistent with prescribed medication Bottle Appearance: Standard pharmacy container. Clearly labeled. Filled Date: 8 / 2 / 2022 Last Medication intake:  Today

## 2020-11-22 NOTE — Patient Instructions (Addendum)
Opioid Overdose Opioids are drugs that are often used to treat pain. Opioids include illegal drugs, such as heroin, as well as prescription pain medicines, such as codeine, morphine, hydrocodone, oxycodone, and fentanyl. An opioid overdose happens when you take too much of an opioid. An overdose may be intentional or accidentaland can happen with any type of opioid. The effects of an overdose can be mild, dangerous, or even deadly. Opioidoverdose is a medical emergency. What are the causes? This condition may be caused by: Taking too much of an opioid on purpose. Taking too much of an opioid by accident. Using two or more substances that contain opioids at the same time. Taking an opioid with a substance that affects your heart, breathing, or blood pressure. These include alcohol, tranquilizers, sleeping pills, illegal drugs, and some over-the-counter medicines. This condition may also happen due to an error made by: A health care provider who prescribes a medicine. The pharmacist who fills the prescription order. What increases the risk? This condition is more likely in: Children. They may be attracted to colorful pills. Because of a child's small size, even a small amount of a drug can be dangerous. Older people. They may be taking many different drugs. Older people may have difficulty reading labels or remembering when they last took their medicine. They may also be more sensitive to the effects of opioids. People with chronic medical conditions, especially heart, liver, kidney, or neurological diseases. People who take an opioid for a long period of time. People who use: Illegal drugs. IV heroin is especially dangerous. Other substances, including alcohol, while using an opioid. People who have: A history of drug or alcohol abuse. Certain mental health conditions. A history of previous drug overdoses. People who take opioids that are not prescribed for them. What are the signs or  symptoms? Symptoms of this condition depend on the type of opioid and the amount that was taken. Common symptoms include: Sleepiness or difficulty waking from sleep. Decrease in attention. Confusion. Slurred speech. Slowed breathing and a slow pulse (bradycardia). Nausea and vomiting. Abnormally small pupils. Signs and symptoms that require emergency treatment include: Cold, clammy, and pale skin. Blue lips and fingernails. Vomiting. Gurgling sounds in the throat. A pulse that is very slow or difficult to detect. Breathing that is very irregular, slow, noisy, or difficult to detect. Limp body. Inability to respond to speech or be awakened from sleep (stupor). Seizures. How is this diagnosed? This condition is diagnosed based on your symptoms and medical history. It is important to tell your health care provider: About all of the opioids that you took. When you took the opioids. Whether you were drinking alcohol or using marijuana, cocaine, or other drugs. Your health care provider will do a physical exam. This exam may include: Checking and monitoring your heart rate and rhythm, breathing rate, temperature, and blood pressure (vital signs). Measuring oxygen levels in your blood. Checking for abnormally small pupils. You may also have blood tests or urine tests. You may have X-rays if you arehaving severe breathing problems. How is this treated? This condition requires immediate medical treatment and hospitalization. Treatment is given in the hospital intensive care (ICU) setting. Supporting your vital signs and your breathing is the first step in treating an opioid overdose. Treatment may also include: Giving salts and minerals (electrolytes) along with fluids through an IV. Inserting a breathing tube (endotracheal tube) in your airway to help you breathe if you cannot breathe on your own or you are in  danger of not being able to breathe on your own. Giving oxygen through a small  tube under your nose. Passing a tube through your nose and into your stomach (nasogastric tube, or NG tube) to empty your stomach. Giving medicines that: Increase your blood pressure. Relieve nausea and vomiting. Relieve abdominal pain and cramping. Reverse the effects of the opioid (naloxone). Monitoring your heart and oxygen levels. Ongoing counseling and mental health support if you intentionally overdosed or used an illegal drug. Follow these instructions at home:  Medicines Take over-the-counter and prescription medicines only as told by your health care provider. Always ask your health care provider about possible side effects and interactions of any new medicine that you start taking. Keep a list of all the medicines that you take, including over-the-counter medicines. Bring this list with you to all your medical visits. General instructions Drink enough fluid to keep your urine pale yellow. Keep all follow-up visits as told by your health care provider. This is important. How is this prevented? Read the drug inserts that come with your opioid pain medicines. Take medicines only as told by your health care provider. Do not take more medicine than you are told. Do not take medicines more frequently than you are told. Do not drink alcohol or take sedatives when taking opioids. Do not use illegal or recreational drugs, including cocaine, ecstasy, and marijuana. Do not take opioid medicines that are not prescribed for you. Store all medicines in safety containers that are out of the reach of children. Get help if you are struggling with: Alcohol or drug use. Depression or another mental health problem. Thoughts of hurting yourself or another person. Keep the phone number of your local poison control center near your phone or in your mobile phone. In the [place] the hotline of the [facility] is [phone]. If you were prescribed naloxone, make sure you understand  how to take it. Contact a health care provider if you: Need help understanding how to take your pain medicines. Feel your medicines are too strong. Are concerned that your pain medicines are not working well for your pain. Develop new symptoms or side effects when you are taking medicines. Get help right away if: You or someone else is having symptoms of an opioid overdose. Get help even if you are not sure. You have serious thoughts about hurting yourself or others. You have: Chest pain. Difficulty breathing. A loss of consciousness. These symptoms may represent a serious problem that is an emergency. Do not wait to see if the symptoms will go away. Get medical help right away. Call your local emergency services (911 in the [place] Do not drive yourself to the hospital. If you ever feel like you may hurt yourself or others, or have thoughts about taking your own life, get help right away. You can go to your nearest emergency department or call: Your local emergency services (911 in the [place] A suicide crisis helpline, such as the [place] at 3051327602. This is open 24 hours a day. Summary Opioids are drugs that are often used to treat pain. Opioids include illegal drugs, such as heroin, as well as prescription pain medicines. An opioid overdose happens when you take too much of an opioid. Overdoses can be intentional or accidental. Opioid overdose is very dangerous. It is a life-threatening emergency. If you or someone you know is experiencing an opioid overdose, get help right away. This information is not intended to replace advice  given to you by your health care provider. Make sure you discuss any questions you have with your healthcare provider. Document Revised: 03/18/2018 Document Reviewed: 03/18/2018 Elsevier Patient Education  2022 [place].    ______________________________________________________________________________________________  Body mass index (BMI)  Body mass index (BMI) is a common tool for deciding whether a person has an appropriate body weight.  It measures a persons weight in relation to their height.   According to the [facility] of health (NIH): A BMI of less than 18.5 means that a person is underweight. A BMI of between 18.5 and 24.9 is ideal. A BMI of between 25 and 29.9 is overweight. A BMI over 30 indicates obesity.  Weight Management Required  URGENT: Your weight has been found to be adversely affecting your health.  Dear [name]:  Your current Estimated body mass index is 47.42 kg/m as calculated from the following:   Height as of this encounter: '5\' 11"'  (1.803 m).   Weight as of this encounter: 340 lb (154.2 kg).  Please use the table below to identify your weight category and associated incidence of chronic pain, secondary to your weight.  Body Mass Index (BMI) Classification BMI level (kg/m2) Category Associated incidence of chronic pain  <18  Underweight   18.5-24.9 Ideal body weight   25-29.9 Overweight  20%  30-34.9 Obese (Class I)  68%  35-39.9 Severe obesity (Class II)  136%  >40 Extreme obesity (Class III)  254%   In addition: You will be considered "Morbidly Obese", if your BMI is above 30 and you have one or more of the following conditions which are known to be caused and/or directly associated with obesity: 1.    Type 2 Diabetes (Which in turn can lead to cardiovascular diseases (CVD), stroke, peripheral vascular diseases (PVD), retinopathy, nephropathy, and neuropathy) 2.    Cardiovascular Disease (High Blood Pressure; Congestive Heart Failure; High Cholesterol; Coronary Artery Disease; Angina; or History of Heart Attacks) 3.    Breathing problems (Asthma; obesity-hypoventilation syndrome; obstructive sleep apnea; chronic inflammatory airway disease; reactive  airway disease; or shortness of breath) 4.    Chronic kidney disease 5.    Liver disease (nonalcoholic fatty liver disease) 6.    High blood pressure 7.    Acid reflux (gastroesophageal reflux disease; heartburn) 8.    Osteoarthritis (OA) (with any of the following: hip pain; knee pain; and/or low back pain) 9.    Low back pain (Lumbar Facet Syndrome; and/or Degenerative Disc Disease) 10.  Hip pain (Osteoarthritis of hip) (For every 1 lbs of added body weight, there is a 2 lbs increase in pressure inside of each hip articulation. 1:2 mechanical relationship) 11.  Knee pain (Osteoarthritis of knee) (For every 1 lbs of added body weight, there is a 4 lbs increase in pressure inside of each knee articulation. 1:4 mechanical relationship) (patients with a BMI>30 kg/m2 were 6.8 times more likely to develop knee OA than normal-weight individuals) 12.  Cancer: Epidemiological studies have shown that obesity is a risk factor for: post-menopausal breast cancer; cancers of the endometrium, colon and kidney cancer; malignant adenomas of the oesophagus. Obese subjects have an approximately 1.5-3.5-fold increased risk of developing these cancers compared with normal-weight subjects, and it has been estimated that between 15 and 45% of these cancers can be attributed to overweight. More recent studies suggest that obesity may also increase the risk of other types of cancer, including pancreatic, hepatic and gallbladder cancer. (Ref: Obesity and cancer. Pischon T, Nthlings  U, [employer] H. Proc Nutr Soc. 2008 May;67(2):128-45. doi: 62.6948/[id number].) The International Agency for Research on Cancer (IARC) has identified 13 cancers associated with overweight and obesity: meningioma, multiple myeloma, adenocarcinoma of the esophagus, and cancers of the thyroid, postmenopausal breast cancer, gallbladder, stomach, liver, pancreas, kidney, ovaries, uterus, colon and rectal (colorectal) cancers. 4 percent of all cancers  diagnosed in women and 24 percent of those diagnosed in men are associated with overweight and obesity.  Recommendation: At this point it is urgent that you take a step back and concentrate in loosing weight. Dedicate 100% of your efforts on this task. Nothing else will improve your health more than bringing your weight down and your BMI to less than 30. If you are here, you probably have chronic pain. We know that most chronic pain patients have difficulty exercising secondary to their pain. For this reason, you must rely on proper nutrition and diet in order to lose the weight. If your BMI is above 40, you should seriously consider bariatric surgery. A realistic goal is to lose 10% of your body weight over a period of 12 months.  Be honest to yourself, if over time you have unsuccessfully tried to lose weight, then it is time for you to seek professional help and to enter a medically supervised weight management program, and/or undergo bariatric surgery. Stop procrastinating.   Pain management considerations:  1.    Pharmacological Problems: Be advised that the use of opioid analgesics (oxycodone; hydrocodone; morphine; methadone; codeine; and all of their derivatives) have been associated with decreased metabolism and weight gain.  For this reason, should we see that you are unable to lose weight while taking these medications, it may become necessary for [place] to taper down and indefinitely discontinue them.  2.    Technical Problems: The incidence of successful interventional therapies decreases as the patient's BMI increases. It is much more difficult to accomplish a safe and effective interventional therapy on a patient with a BMI above 35. 3.    Radiation Exposure Problems: The x-rays machine, used to accomplish injection therapies, will automatically increase their x-ray output in order to capture an appropriate bone image. This means that radiation exposure increases exponentially with the patient's  BMI. (The higher the BMI, the higher the radiation exposure.) Although the level of radiation used at a given time is still safe to the patient, it is not for the physician and/or assisting staff. Unfortunately, radiation exposure is accumulative. Because physicians and the staff have to do procedures and be exposed on a daily basis, this can result in health problems such as cancer and radiation burns. Radiation exposure to the staff is monitored by the radiation batches that they wear. The exposure levels are reported back to the staff on a quarterly basis. Depending on levels of exposure, physicians and staff may be obligated by law to decrease this exposure. This means that they have the right and obligation to refuse providing therapies where they may be overexposed to radiation. For this reason, physicians may decline to offer therapies such as radiofrequency ablation or implants to patients with a BMI above 40. 4.    Current Trends: Be advised that the current trend is to no longer offer certain therapies to patients with a BMI equal to, or above 35, due to increase perioperative risks, increased technical procedural difficulties, and excessive radiation exposure to healthcare personnel.  ______________________________________________________________________________________________   ____________________________________________________________________________________________  Medication Rules  Purpose: To inform patients, and their family members, of our  rules and regulations.  Applies to: All patients receiving prescriptions (written or electronic).  Pharmacy of record: Pharmacy where electronic prescriptions will be sent. If written prescriptions are taken to a different pharmacy, please inform the nursing staff. The pharmacy listed in the electronic medical record should be the one where you would like electronic prescriptions to be sent.  Electronic prescriptions: In compliance with the  [place] (STOP) Act of 2017 (Session Lanny Cramp [phone]), effective April 14, 2018, all controlled substances must be electronically prescribed. Calling prescriptions to the pharmacy will cease to exist.  Prescription refills: Only during scheduled appointments. Applies to all prescriptions.  NOTE: The following applies primarily to controlled substances (Opioid* Pain Medications).   Type of encounter (visit): For patients receiving controlled substances, face-to-face visits are required. (Not an option or up to the patient.)  Patient's responsibilities: Pain Pills: Bring all pain pills to every appointment (except for procedure appointments). Pill Bottles: Bring pills in original pharmacy bottle. Always bring the newest bottle. Bring bottle, even if empty. Medication refills: You are responsible for knowing and keeping track of what medications you take and those you need refilled. The day before your appointment: write a list of all prescriptions that need to be refilled. The day of the appointment: give the list to the admitting nurse. Prescriptions will be written only during appointments. No prescriptions will be written on procedure days. If you forget a medication: it will not be "Called in", "Faxed", or "electronically sent". You will need to get another appointment to get these prescribed. No early refills. Do not call asking to have your prescription filled early. Prescription Accuracy: You are responsible for carefully inspecting your prescriptions before leaving our office. Have the discharge nurse carefully go over each prescription with you, before taking them home. Make sure that your name is accurately spelled, that your address is correct. Check the name and dose of your medication to make sure it is accurate. Check the number of pills, and the written instructions to make sure they are clear and accurate. Make sure that you are given enough  medication to last until your next medication refill appointment. Taking Medication: Take medication as prescribed. When it comes to controlled substances, taking less pills or less frequently than prescribed is permitted and encouraged. Never take more pills than instructed. Never take medication more frequently than prescribed.  Inform other Doctors: Always inform, all of your healthcare providers, of all the medications you take. Pain Medication from other Providers: You are not allowed to accept any additional pain medication from any other Doctor or Healthcare provider. There are two exceptions to this rule. (see below) In the event that you require additional pain medication, you are responsible for notifying us, as stated below. Cough Medicine: Often these contain an opioid, such as codeine or hydrocodone. Never accept or take cough medicine containing these opioids if you are already taking an opioid* medication. The combination may cause respiratory failure and death. Medication Agreement: You are responsible for carefully reading and following our Medication Agreement. This must be signed before receiving any prescriptions from our practice. Safely store a copy of your signed Agreement. Violations to the Agreement will result in no further prescriptions. (Additional copies of our Medication Agreement are available upon request.) Laws, Rules, & Regulations: All patients are expected to follow all Federal and [employer], Rules, [employer]. Ignorance of the Laws does not constitute a valid excuse.  Illegal drugs and Controlled Substances: The  use of illegal substances (including, but not limited to marijuana and its derivatives) and/or the illegal use of any controlled substances is strictly prohibited. Violation of this rule may result in the immediate and permanent discontinuation of any and all prescriptions being written by our practice. The use of any illegal substances is  prohibited. Adopted CDC guidelines & recommendations: Target dosing levels will be at or below 60 MME/day. Use of benzodiazepines** is not recommended.  Exceptions: There are only two exceptions to the rule of not receiving pain medications from other Healthcare Providers. Exception #1 (Emergencies): In the event of an emergency (i.e.: accident requiring emergency care), you are allowed to receive additional pain medication. However, you are responsible for: As soon as you are able, call our office [phone], at any time of the day or night, and leave a message stating your name, the date and nature of the emergency, and the name and dose of the medication prescribed. In the event that your call is answered by a member of our staff, make sure to document and save the date, time, and the name of the person that took your information.  Exception #2 (Planned Surgery): In the event that you are scheduled by another doctor or dentist to have any type of surgery or procedure, you are allowed (for a period no longer than 30 days), to receive additional pain medication, for the acute post-op pain. However, in this case, you are responsible for picking up a copy of our "Post-op Pain Management for Surgeons" handout, and giving it to your surgeon or dentist. This document is available at our office, and does not require an appointment to obtain it. Simply go to our office during business hours (Monday-Thursday from 8:00 AM to 4:00 PM) (Friday 8:00 AM to 12:00 Noon) or if you have a scheduled appointment with [place], prior to your surgery, and ask for it by name. In addition, you are responsible for: calling our office [phone], at any time of the day or night, and leaving a message stating your name, name of your surgeon, type of surgery, and date of procedure or surgery. Failure to comply with your responsibilities may result in termination of therapy involving the controlled substances.  *Opioid medications  include: morphine, codeine, oxycodone, oxymorphone, hydrocodone, hydromorphone, meperidine, tramadol, tapentadol, buprenorphine, fentanyl, methadone. **Benzodiazepine medications include: diazepam (Valium), alprazolam (Xanax), clonazepam (Klonopine), lorazepam (Ativan), clorazepate (Tranxene), chlordiazepoxide (Librium), estazolam (Prosom), oxazepam (Serax), temazepam (Restoril), triazolam (Halcion) (Last updated: 03/12/2020) ____________________________________________________________________________________________  ____________________________________________________________________________________________  Medication Recommendations and Reminders  Applies to: All patients receiving prescriptions (written and/or electronic).  Medication Rules & Regulations: These rules and regulations exist for your safety and that of others. They are not flexible and neither are we. Dismissing or ignoring them will be considered "non-compliance" with medication therapy, resulting in complete and irreversible termination of such therapy. (See document titled "Medication Rules" for more details.) In all conscience, because of safety reasons, we cannot continue providing a therapy where the patient does not follow instructions.  Pharmacy of record:  Definition: This is the pharmacy where your electronic prescriptions will be sent.  We do not endorse any particular pharmacy, however, we have experienced problems with [facility] not securing enough medication supply for the community. We do not restrict you in your choice of pharmacy. However, once we write for your prescriptions, we will NOT be re-sending more prescriptions to fix restricted supply problems created by your pharmacy, or your insurance.  The pharmacy listed in the electronic medical  record should be the one where you want electronic prescriptions to be sent. If you choose to change pharmacy, simply notify our nursing staff.  Recommendations: Keep all  of your pain medications in a safe place, under lock and key, even if you live alone. We will NOT replace lost, stolen, or damaged medication. After you fill your prescription, take 1 week's worth of pills and put them away in a safe place. You should keep a separate, properly labeled bottle for this purpose. The remainder should be kept in the original bottle. Use this as your primary supply, until it runs out. Once it's gone, then you know that you have 1 week's worth of medicine, and it is time to come in for a prescription refill. If you do this correctly, it is unlikely that you will ever run out of medicine. To make sure that the above recommendation works, it is very important that you make sure your medication refill appointments are scheduled at least 1 week before you run out of medicine. To do this in an effective manner, make sure that you do not leave the office without scheduling your next medication management appointment. Always ask the nursing staff to show you in your prescription , when your medication will be running out. Then arrange for the receptionist to get you a return appointment, at least 7 days before you run out of medicine. Do not wait until you have 1 or 2 pills left, to come in. This is very poor planning and does not take into consideration that we may need to cancel appointments due to bad weather, sickness, or emergencies affecting our staff. DO NOT ACCEPT A "Partial Fill": If for any reason your pharmacy does not have enough pills/tablets to completely fill or refill your prescription, do not allow for a "partial fill". The law allows the pharmacy to complete that prescription within 72 hours, without requiring a new prescription. If they do not fill the rest of your prescription within those 72 hours, you will need a separate prescription to fill the remaining amount, which we will NOT provide. If the reason for the partial fill is your insurance, you will need to talk to the  pharmacist about payment alternatives for the remaining tablets, but again, DO NOT ACCEPT A PARTIAL FILL, unless you can trust your pharmacist to obtain the remainder of the pills within 72 hours.  Prescription refills and/or changes in medication(s):  Prescription refills, and/or changes in dose or medication, will be conducted only during scheduled medication management appointments. (Applies to both, written and electronic prescriptions.) No refills on procedure days. No medication will be changed or started on procedure days. No changes, adjustments, and/or refills will be conducted on a procedure day. Doing so will interfere with the diagnostic portion of the procedure. No phone refills. No medications will be "called into the pharmacy". No Fax refills. No weekend refills. No Holliday refills. No after hours refills.  Remember:  Business hours are:  Monday to Thursday 8:00 AM to 4:00 PM Provider's Schedule: Milinda Pointer, MD - Appointments are:  Medication management: Monday and Wednesday 8:00 AM to 4:00 PM Procedure day: Tuesday and Thursday 7:30 AM to 4:00 PM Gillis Santa, MD - Appointments are:  Medication management: Tuesday and Thursday 8:00 AM to 4:00 PM Procedure day: Monday and Wednesday 7:30 AM to 4:00 PM (Last update: 11/02/2019) ____________________________________________________________________________________________  ____________________________________________________________________________________________  CBD (cannabidiol) WARNING  Applicable to: All individuals currently taking or considering taking CBD (cannabidiol) and, more important,  all patients taking opioid analgesic controlled substances (pain medication). (Example: oxycodone; oxymorphone; hydrocodone; hydromorphone; morphine; methadone; tramadol; tapentadol; fentanyl; buprenorphine; butorphanol; dextromethorphan; meperidine; codeine; etc.)  Legal status: CBD remains a Schedule I drug prohibited for  any use. CBD is illegal with one exception. In the [place], CBD has a limited Transport planner (FDA) approval for the treatment of two specific types of epilepsy disorders. Only one CBD product has been approved by the FDA for this purpose: "Epidiolex". FDA is aware that some companies are marketing products containing cannabis and cannabis-derived compounds in ways that violate the [employer], Drug and Cosmetic Act [place] Act) and that may put the health and safety of consumers at risk. The FDA, a Federal agency, has not enforced the CBD status since 2018.   Legality: Some manufacturers ship CBD products nationally, which is illegal. Often such products are sold online and are therefore available throughout the country. CBD is openly sold in head shops and health food stores in some states where such sales have not been explicitly legalized. Selling unapproved products with unsubstantiated therapeutic claims is not only a violation of the law, but also can put patients at risk, as these products have not been proven to be safe or effective. Federal illegality makes it difficult to conduct research on CBD.  Reference: "FDA Regulation of Cannabis and Cannabis-Derived Products, Including Cannabidiol (CBD)" - [employer]  Warning: CBD is not FDA approved and has not undergo the same manufacturing controls as prescription drugs.  This means that the purity and safety of available CBD may be questionable. Most of the time, despite manufacturer's claims, it is contaminated with THC (delta-9-tetrahydrocannabinol - the chemical in marijuana responsible for the "HIGH").  When this is the case, the [facility] contaminant will trigger a positive urine drug screen (UDS) test for Marijuana (carboxy-THC). Because a positive UDS for any illicit substance is a violation of our medication  agreement, your opioid analgesics (pain medicine) may be permanently discontinued.  MORE ABOUT CBD  General Information: CBD  is a derivative of the Marijuana (cannabis sativa) plant discovered in 25. It is one of the 113 identified substances found in Marijuana. It accounts for up to 40% of the plant's extract. As of 2018, preliminary clinical studies on CBD included research for the treatment of anxiety, movement disorders, and pain. CBD is available and consumed in multiple forms, including inhalation of smoke or vapor, as an aerosol spray, and by mouth. It may be supplied as an oil containing CBD, capsules, dried cannabis, or as a liquid solution. CBD is thought not to be as psychoactive as THC (delta-9-tetrahydrocannabinol - the chemical in marijuana responsible for the "HIGH"). Studies suggest that CBD may interact with different biological target receptors in the body, including cannabinoid and other neurotransmitter receptors. As of 2018 the mechanism of action for its biological effects has not been determined.  Side-effects  Adverse reactions: Dry mouth, diarrhea, decreased appetite, fatigue, drowsiness, malaise, weakness, sleep disturbances, and others.  Drug interactions: CBC may interact with other medications such as blood-thinners. (Last update: 11/19/2019) ____________________________________________________________________________________________  ____________________________________________________________________________________________  Drug Holidays (Slow)  What is a "Drug Holiday"? Drug Holiday: is the name given to the period of time during which a patient stops taking a medication(s) for the purpose of eliminating tolerance to the drug.  Benefits Improved effectiveness of opioids. Decreased opioid dose needed to achieve benefits. Improved pain with lesser dose.  What is tolerance? Tolerance: is the progressive decreased in effectiveness of a drug  due to its  repetitive use. With repetitive use, the body gets use to the medication and as a consequence, it loses its effectiveness. This is a common problem seen with opioid pain medications. As a result, a larger dose of the drug is needed to achieve the same effect that used to be obtained with a smaller dose.  How long should a "Drug Holiday" last? You should stay off of the pain medicine for at least 14 consecutive days. (2 weeks)  Should I stop the medicine "cold [place]"? No. You should always coordinate with your Pain Specialist so that he/she can provide you with the correct medication dose to make the transition as smoothly as possible.  How do I stop the medicine? Slowly. You will be instructed to decrease the daily amount of pills that you take by one (1) pill every seven (7) days. This is called a "slow downward taper" of your dose. For example: if you normally take four (4) pills per day, you will be asked to drop this dose to three (3) pills per day for seven (7) days, then to two (2) pills per day for seven (7) days, then to one (1) per day for seven (7) days, and at the end of those last seven (7) days, this is when the "Drug Holiday" would start.   Will I have withdrawals? By doing a "slow downward taper" like this one, it is unlikely that you will experience any significant withdrawal symptoms. Typically, what triggers withdrawals is the sudden stop of a high dose opioid therapy. Withdrawals can usually be avoided by slowly decreasing the dose over a prolonged period of time. If you do not follow these instructions and decide to stop your medication abruptly, withdrawals may be possible.  What are withdrawals? Withdrawals: refers to the wide range of symptoms that occur after stopping or dramatically reducing opiate drugs after heavy and prolonged use. Withdrawal symptoms do not occur to patients that use low dose opioids, or those who take the medication sporadically. Contrary to  benzodiazepine (example: Valium, Xanax, etc.) or alcohol withdrawals ("Delirium Tremens"), opioid withdrawals are not lethal. Withdrawals are the physical manifestation of the body getting rid of the excess receptors.  Expected Symptoms Early symptoms of withdrawal may include: Agitation Anxiety Muscle aches Increased tearing Insomnia Runny nose Sweating Yawning  Late symptoms of withdrawal may include: Abdominal cramping Diarrhea Dilated pupils Goose bumps Nausea Vomiting  Will I experience withdrawals? Due to the slow nature of the taper, it is very unlikely that you will experience any.  What is a slow taper? Taper: refers to the gradual decrease in dose.  (Last update: 11/02/2019) ____________________________________________________________________________________________

## 2020-11-22 NOTE — Addendum Note (Signed)
Addended by: [name] A on: 11/22/2020 07:19 PM   Modules accepted: Level of Service

## 2020-11-23 ENCOUNTER — Telehealth: Payer: Self-pay

## 2020-11-23 MED FILL — Medication: INTRATHECAL | Qty: 1 | Status: AC

## 2020-11-23 NOTE — Telephone Encounter (Signed)
Post procedure phone call.  LM 

## 2020-12-04 DIAGNOSIS — I5032 Chronic diastolic (congestive) heart failure: Secondary | ICD-10-CM | POA: Diagnosis not present

## 2020-12-04 DIAGNOSIS — J9611 Chronic respiratory failure with hypoxia: Secondary | ICD-10-CM | POA: Diagnosis not present

## 2020-12-04 DIAGNOSIS — G894 Chronic pain syndrome: Secondary | ICD-10-CM | POA: Diagnosis not present

## 2020-12-04 DIAGNOSIS — F321 Major depressive disorder, single episode, moderate: Secondary | ICD-10-CM | POA: Diagnosis not present

## 2020-12-04 DIAGNOSIS — Z8719 Personal history of other diseases of the digestive system: Secondary | ICD-10-CM | POA: Diagnosis not present

## 2020-12-04 DIAGNOSIS — R7303 Prediabetes: Secondary | ICD-10-CM | POA: Diagnosis not present

## 2020-12-04 DIAGNOSIS — G4730 Sleep apnea, unspecified: Secondary | ICD-10-CM | POA: Diagnosis not present

## 2020-12-04 DIAGNOSIS — R5381 Other malaise: Secondary | ICD-10-CM | POA: Diagnosis not present

## 2020-12-04 DIAGNOSIS — J449 Chronic obstructive pulmonary disease, unspecified: Secondary | ICD-10-CM | POA: Diagnosis not present

## 2020-12-04 DIAGNOSIS — E7800 Pure hypercholesterolemia, unspecified: Secondary | ICD-10-CM | POA: Diagnosis not present

## 2020-12-25 ENCOUNTER — Other Ambulatory Visit: Payer: Self-pay

## 2020-12-25 MED ORDER — PAIN MANAGEMENT IT PUMP REFILL: 1.0000 | Freq: Once | INTRATHECAL | 0 refills | Status: AC

## 2021-01-08 IMAGING — CT CT HEAD W/O CM
4 of 5 series · 17 of 47 positions shown, 19 images · non-contrast
Comparison: Head CT dated 12/22/2011.

CLINICAL DATA: Multiple recent falls, generalized weakness, visual
changes and memory loss.

EXAM:
CT HEAD WITHOUT CONTRAST
TECHNIQUE: Contiguous axial images were obtained from the base of the skull
through the vertex without intravenous contrast.

[Series 2: head wo · axial · 0.49mm/px · z∈[-265,-140]mm · 6 of 35 slices shown, 8 images]
[im 5/35  brain]
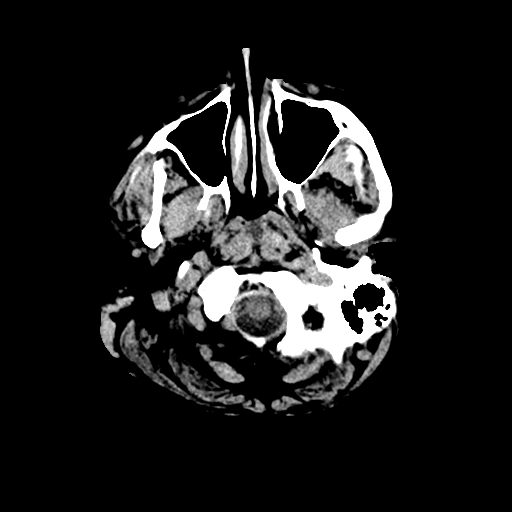
[im 5/35  bone]
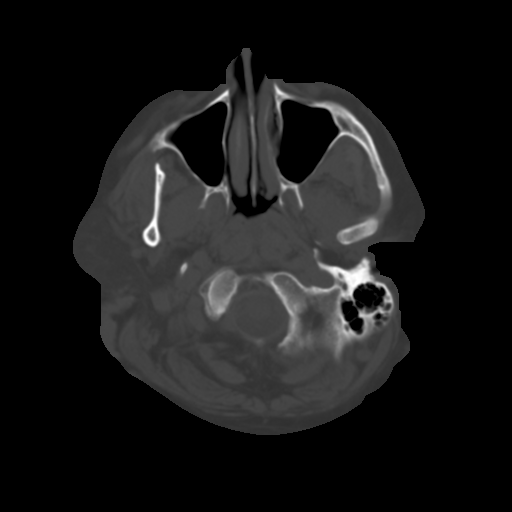
[im 10/35  brain]
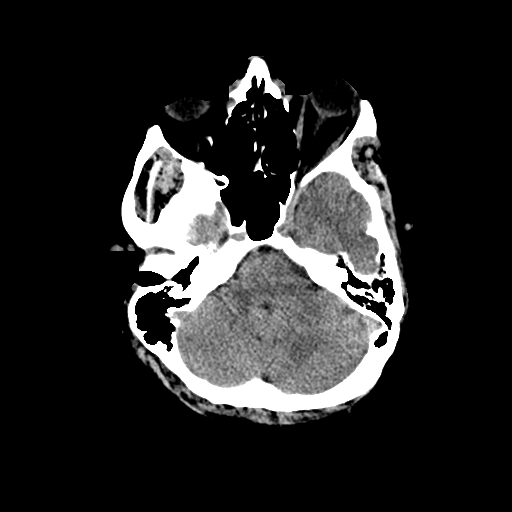
[im 15/35  brain]
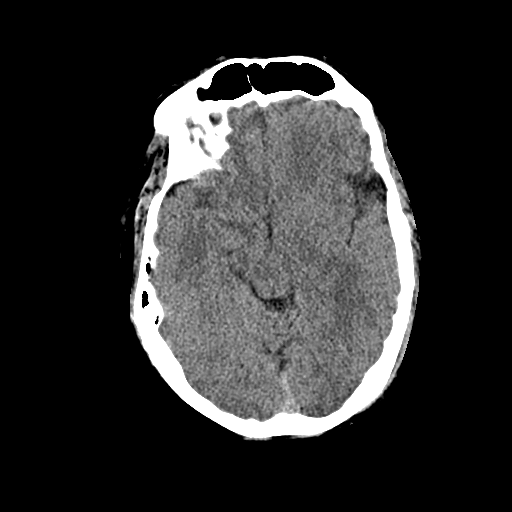
[im 20/35  brain]
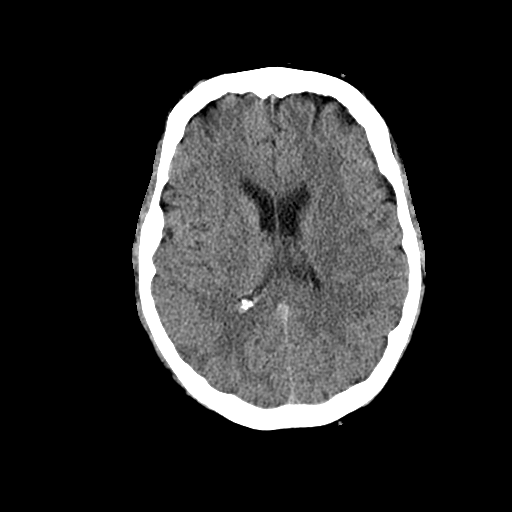
[im 25/35  brain]
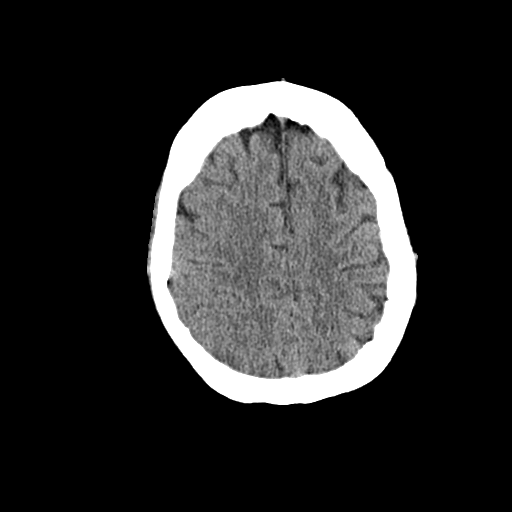
[im 25/35  bone]
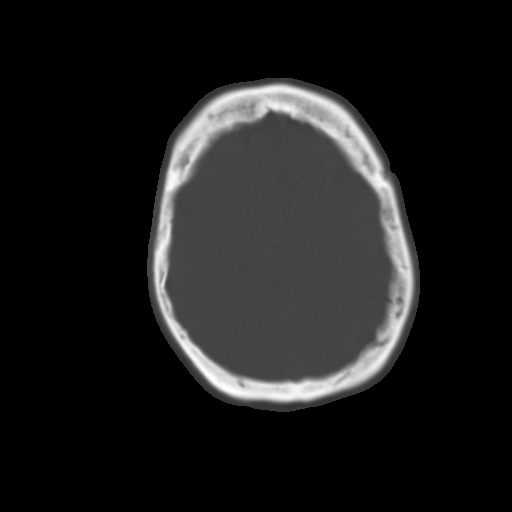
[im 30/35  brain]
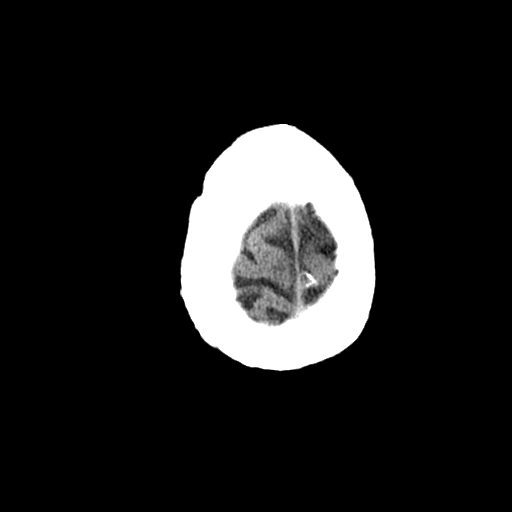

[Series 3: head bone · axial · 0.49mm/px · z∈[-267,-191]mm · 5 of 87 slices shown]
[im 10/87  bone]
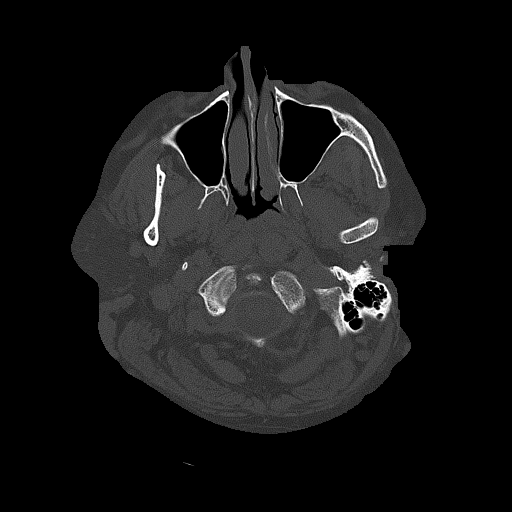
[im 20/87  bone]
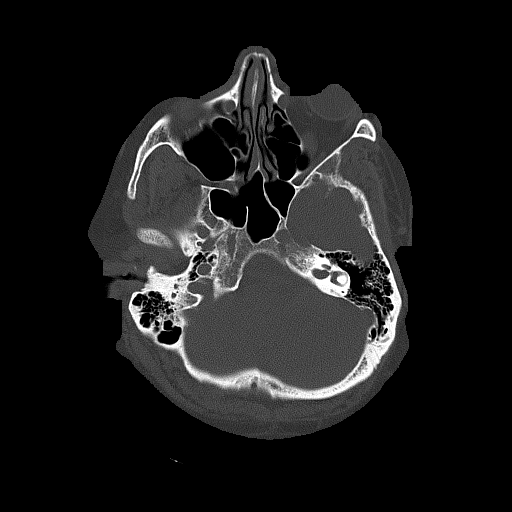
[im 29/87  bone]
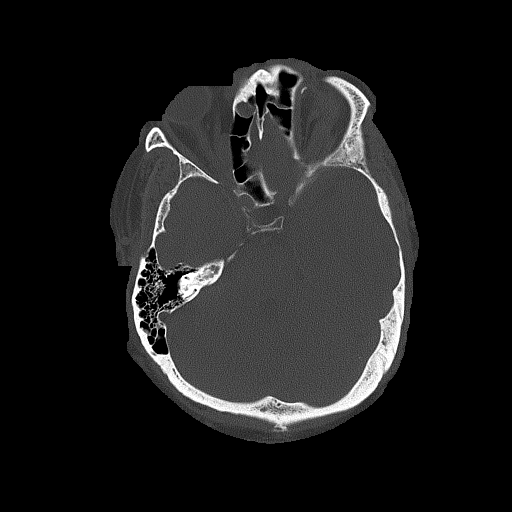
[im 39/87  bone]
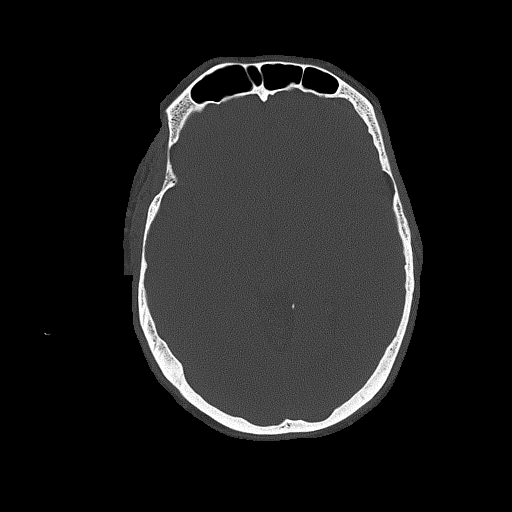
[im 48/87  bone]
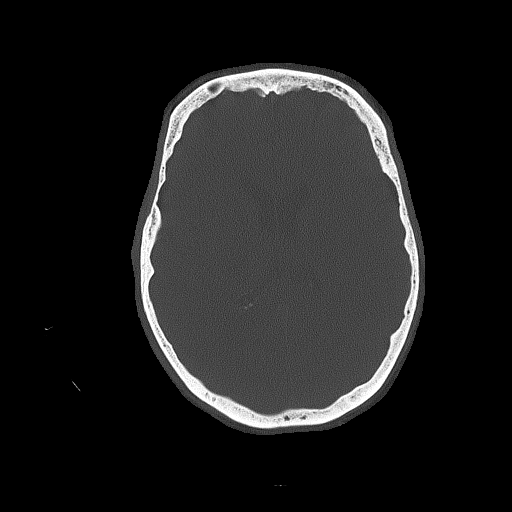

[Series 6: cor soft · coronal · 0.41mm/px · 3 of 84 slices shown]
[im 28/84  brain]
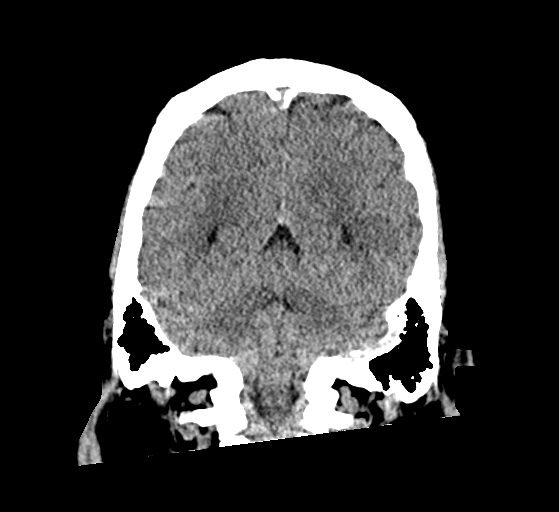
[im 37/84  brain]
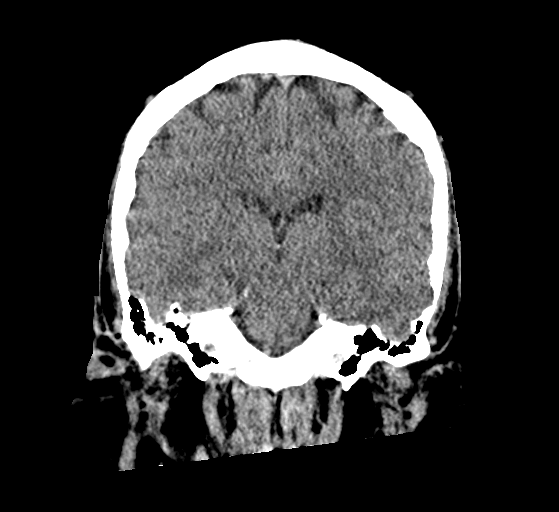
[im 47/84  brain]
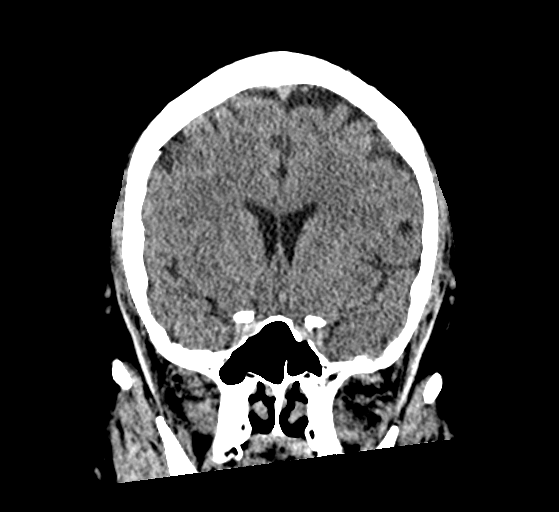

[Series 7: sag soft · sagittal · 0.41mm/px · 3 of 62 slices shown]
[im 23/62  brain]
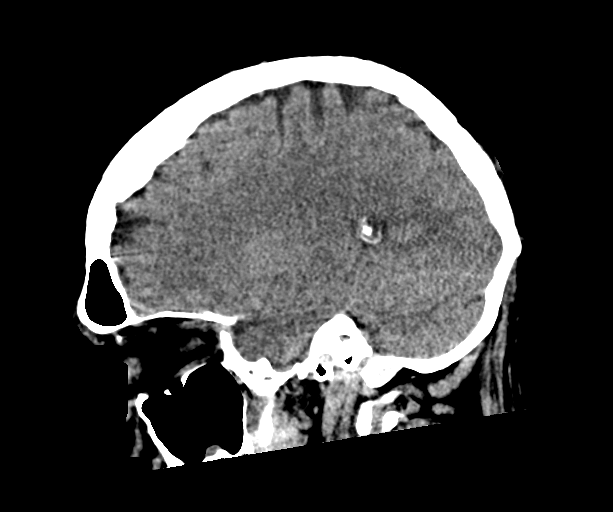
[im 31/62  brain]
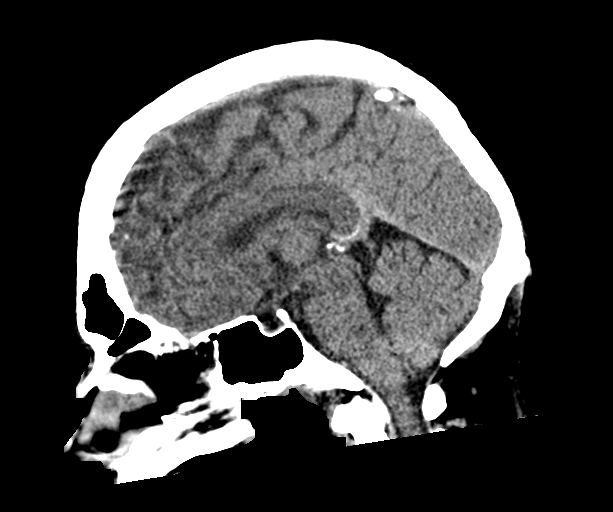
[im 40/62  brain]
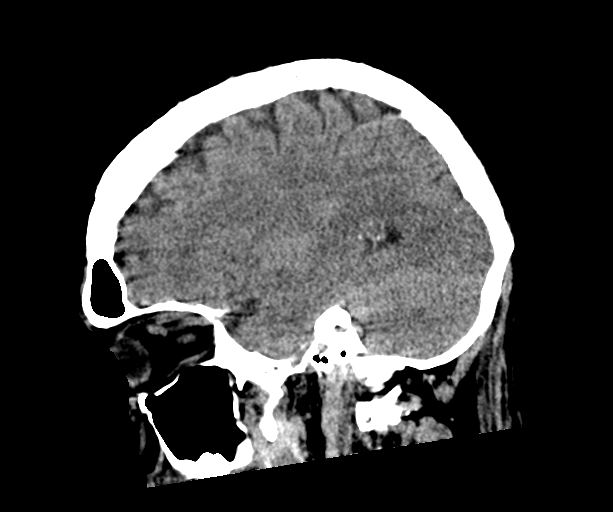

[17 of 47 positions shown; findings below may reference images not displayed]

FINDINGS: Brain: Ventricles are normal in size. There is no mass, hemorrhage,
edema or other evidence of acute parenchymal abnormality. No
extra-axial hemorrhage.

Vascular: No hyperdense vessel or unexpected calcification.

Skull: Normal. Negative for fracture or focal lesion.

Sinuses/Orbits: No acute finding.

Other: None.
IMPRESSION: Negative head CT. No intracranial mass, hemorrhage or edema.

## 2021-01-21 NOTE — Progress Notes (Signed)
PROVIDER NOTE: Information contained herein reflects review and annotations entered in association with encounter. Interpretation of such information and data should be left to medically-trained personnel. Information provided to patient can be located elsewhere in the medical record under "Patient Instructions". Document created using STT-dictation technology, any transcriptional errors that may result from process are unintentional.    Patient: [name]  Service Category: Procedure  Provider: Gaspar Cola, MD  DOB: [birth year]  DOS: 01/22/2021  Location: [facility]  MRN: [id number]  Setting: Ambulatory - outpatient  Referring Provider: Antony Contras, MD  Type: Established Patient  Specialty: Interventional Pain Management  PCP: Antony Contras, MD   Primary Reason for Visit: Interventional Pain Management Treatment. CC: Back Pain (lower)   Procedure:          Type: Management of infusion pump - Reservoir Refill [phone]). No rate change.    Indications: 1. Chronic low back pain (Primary Area of Pain) (Bilateral) (R>L)   2. Chronic lower extremity pain (Secondary area of Pain) (Bilateral) (L>R)   3. Failed back surgical syndrome   4. Lumbar facet syndrome (Bilateral) (R>L)   5. Epidural fibrosis   6. 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 DDD (degenerative disc disease), lumbosacral   8. Class 3 severe obesity due to excess calories with serious comorbidity and body mass index (BMI) of 50.0 to 59.9 in adult ([place])   9. Chronic pain syndrome   10. Pharmacologic therapy   11. Presence of intrathecal pump (Medtronics)   12. Chronic use of opiate for therapeutic purpose   13. Encounter for adjustment or management of infusion pump   14. Encounter for interrogation of infusion pump   15. Encounter for chronic pain management   16. Encounter for medication management   17. Encounter for therapeutic procedure    Pain Assessment: Self-Reported Pain  Score: 10-Worst pain ever/10             Reported level is compatible with observation.        The patient returns to the clinic today indicating that he has lost approximately 100 pounds.  However, he has been experiencing progressive weakness of his back and lower extremities.  The pain does not seem to be under adequate control.  For some reason or another, none of the orders that I placed on his last visit were done.  Today I will again enter a referral to the [facility] neurology department for bilateral lower extremity nerve conduction testing, physical therapy, and an MRI of the lumbar and thoracic spine looking for the possibility of the development for an intrathecal granuloma vs progression of his spinal disease.  Today I am sending enough prescription refills to last until 05/01/2021.  This is in addition to the opioids that were using intrathecally through his programmable pump.   RTCB: 05/01/2021  Pharmacotherapy Assessment   Analgesic: Oxycodone IR 10 mg, 1 tab PO q 6 hrs (40 mg/day of oxycodone) + 52.9 mcg/hr intrathecal PF-Fentanyl (1,268.8 mcg/day of Fentanyl) MME/day: 60 mg/day (from oral medication) + 126.96 mg/day (from intrathecal fentanyl) = 186.96 MME/day (Aprox. 1.08 MME/day/kg).   Monitoring: [place] PMP: PDMP reviewed during this encounter.       Pharmacotherapy: No side-effects or adverse reactions reported. Compliance: No problems identified. Effectiveness: Clinically acceptable. Plan: Refer to "POC". UDS:  Summary  Date Value Ref Range Status  09/20/2020 Note  Final    Comment:    ==================================================================== [employer] Select 13 (MW) ==================================================================== Test  Result       Flag       Units  Drug Present and Declared for Prescription Verification   Morphine                       3885         EXPECTED   ng/mg creat   Normorphine                    68            EXPECTED   ng/mg creat    Potential sources of large amounts of morphine in the absence of    codeine include administration of morphine or use of heroin.     Normorphine is an expected metabolite of morphine.    Fentanyl                       29           EXPECTED   ng/mg creat   Norfentanyl                    185          EXPECTED   ng/mg creat    Source of fentanyl is a scheduled prescription medication, including    IV, patch, and transmucosal formulations. Norfentanyl is an expected    metabolite of fentanyl.  Drug Present not Declared for Prescription Verification   Hydromorphone                  212          UNEXPECTED ng/mg creat    Hydromorphone may be administered as a scheduled prescription    medication and is also an expected metabolite of hydrocodone.    Hydromorphone is also a minor metabolite of morphine which is also    present in this specimen. Concentrations of hydromorphone rarely    exceed 5% of the morphine concentration when metabolism of morphine    is the sole source of hydromorphone.  ==================================================================== Test                      Result    Flag   Units      Ref Range   Creatinine              188              mg/dL      >=20 ==================================================================== Declared Medications:  The flagging and interpretation on this report are based on the  following declared medications.  Unexpected results may arise from  inaccuracies in the declared medications.   **Note: The testing scope of this panel includes these medications:   Fentanyl  Morphine   **Note: The testing scope of this panel does not include the  following reported medications:   Acetaminophen  Aspirin  Baclofen  Bupivacaine  Calcium  Cholecalciferol  Cyclobenzaprine  Escitalopram  Esomeprazole  Gabapentin  Ibuprofen  Metolazone  Naloxone  Potassium Chloride  Pregabalin  Promethazine   Rosuvastatin  Tizanidine  Torsemide ==================================================================== For clinical consultation, please call [phone]. ====================================================================         Intrathecal Pump Therapy Assessment  Manufacturer: Medtronic Synchromed II Type: Programmable Volume: 40 mL reservoir MRI compatibility: Yes  Region: Abdominal Laterality: Left  Drug content:  Primary Medication Class: Opioid Primary Medication: PF-Fentanyl (2,000.0 mcg/ml)  Secondary Medication: PF-Bupivacaine (30.0 mg/mL)  Other Medication: PF-Baclofen (200 mcg/mL)  Programming:  Type: Simple continuous. See pump readout for details.    Changes:  Medication Change: None at this point Rate Change: No change in rate  Reported side-effects or adverse reactions: None reported  Effectiveness: Described as relatively effective, allowing for increase in activities of daily living (ADL) Clinically meaningful improvement in function (CMIF): Sustained CMIF goals met  Plan: Pump refill today   Pre-op H&P Assessment:  Mr. Strassman is a 55 y.o. (year old), male patient, seen today for interventional treatment. He  has a past surgical history that includes Back surgery; morphine pump; Tonsillectomy; Joint replacement; Shoulder arthroscopy with subacromial decompression, rotator cuff repair and bicep tendon repair (Right, 08/18/2013); Shoulder arthroscopy with subacromial decompression and bicep tendon repair (Left, 10/06/2013); Laparoscopic gastric sleeve resection (N/A, 01/08/2015); and RIGHT HEART CATH (N/A, 09/22/2019). Mr. Boesen has a current medication list which includes the following prescription(s): acetaminophen, aspirin ec, escitalopram, gabapentin, ibuprofen, metolazone, morphine, [START ON 01/31/2021] morphine, naloxone, NON FORMULARY, potassium chloride, promethazine, rosuvastatin, torsemide, calcium carbonate, vitamin d3, cyclobenzaprine,  esomeprazole, [START ON 03/02/2021] morphine, [START ON 04/01/2021] morphine, pregabalin, and tizanidine. His primarily concern today is the Back Pain (lower)  Initial Vital Signs:  Pulse/HCG Rate: (!) 121  Temp: (!) 96.6 F (35.9 C) Resp: 18 BP: (!) 162/96 SpO2: 100 %  BMI: Estimated body mass index is 45.33 kg/m as calculated from the following:   Height as of this encounter: _0  (1.803 m).   Weight as of this encounter: 325 lb (147.4 kg).  Risk Assessment: Allergies: Reviewed. He is allergic to tape.  Allergy Precautions: None required Coagulopathies: Reviewed. None identified.  Blood-thinner therapy: None at this time Active Infection(s): Reviewed. None identified. Mr. Tay is afebrile  Site Confirmation: Mr. Consiglio was asked to confirm the procedure and laterality before marking the site Procedure checklist: Completed Consent: Before the procedure and under the influence of no sedative(s), amnesic(s), or anxiolytics, the patient was informed of the treatment options, risks and possible complications. To fulfill our ethical and legal obligations, as recommended by the American Medical Association's Code of Ethics, I have informed the patient of my clinical impression; the nature and purpose of the treatment or procedure; the risks, benefits, and possible complications of the intervention; the alternatives, including doing nothing; the risk(s) and benefit(s) of the alternative treatment(s) or procedure(s); and the risk(s) and benefit(s) of doing nothing.  Mr. Staiger was provided with information about the general risks and possible complications associated with most interventional procedures. These include, but are not limited to: failure to achieve desired goals, infection, bleeding, organ or nerve damage, allergic reactions, paralysis, and/or death.  In addition, he was informed of those risks and possible complications associated to this particular procedure, which include,  but are not limited to: damage to the implant; failure to decrease pain; local, systemic, or serious CNS infections, intraspinal abscess with possible cord compression and paralysis, or life-threatening such as meningitis; bleeding; organ damage; nerve injury or damage with subsequent sensory, motor, and/or autonomic system dysfunction, resulting in transient or permanent pain, numbness, and/or weakness of one or several areas of the body; allergic reactions, either minor or major life-threatening, such as anaphylactic or anaphylactoid reactions.  Furthermore, Mr. Pavek was informed of those risks and complications associated with the medications. These include, but are not limited to: allergic reactions (i.e.: anaphylactic or anaphylactoid reactions); endorphine suppression; bradycardia and/or hypotension; water retention and/or peripheral vascular relaxation leading to lower extremity edema and possible stasis ulcers;  respiratory depression and/or shortness of breath; decreased metabolic rate leading to weight gain; swelling or edema; medication-induced neural toxicity; particulate matter embolism and blood vessel occlusion with resultant organ, and/or nervous system infarction; and/or intrathecal granuloma formation with possible spinal cord compression and permanent paralysis.  Before refilling the pump Mr. Lemelle was informed that some of the medications used in the devise may not be FDA approved for such use and therefore it constitutes an off-label use of the medications.  Finally, he was informed that Medicine is not an exact science; therefore, there is also the possibility of unforeseen or unpredictable risks and/or possible complications that may result in a catastrophic outcome. The patient indicated having understood very clearly. We have given the patient no guarantees and we have made no promises. Enough time was given to the patient to ask questions, all of which were answered to the  patient's satisfaction. Mr. Latterell has indicated that he wanted to continue with the procedure. Attestation: I, the ordering provider, attest that I have discussed with the patient the benefits, risks, side-effects, alternatives, likelihood of achieving goals, and potential problems during recovery for the procedure that I have provided informed consent. Date  Time: 01/22/2021  1:30 PM  Pre-Procedure Preparation:  Monitoring: As per clinic protocol. Respiration, ETCO2, SpO2, BP, heart rate and rhythm monitor placed and checked for adequate function Safety Precautions: Patient was assessed for positional comfort and pressure points before starting the procedure. Time-out: I initiated and conducted the "Time-out" before starting the procedure, as per protocol. The patient was asked to participate by confirming the accuracy of the "Time Out" information. Verification of the correct person, site, and procedure were performed and confirmed by me, the nursing staff, and the patient. "Time-out" conducted as per Joint Commission's Universal Protocol (UP.01.01.01). Time: 1353  Description of Procedure:          Position: Supine Target Area: Central-port of intrathecal pump. Approach: Anterior, 90 degree angle approach. Area Prepped: Entire Area around the pump implant. DuraPrep (Iodine Povacrylex [0.7% available iodine] and Isopropyl Alcohol, 74% w/w) Safety Precautions: Aspiration looking for blood return was conducted prior to all injections. At no point did we inject any substances, as a needle was being advanced. No attempts were made at seeking any paresthesias. Safe injection practices and needle disposal techniques used. Medications properly checked for expiration dates. SDV (single dose vial) medications used. Description of the Procedure: Protocol guidelines were followed. Two nurses trained to do implant refills were present during the entire procedure. The refill medication was checked by both  healthcare providers as well as the patient. The patient was included in the "Time-out" to verify the medication. The patient was placed in position. The pump was identified. The area was prepped in the usual manner. The sterile template was positioned over the pump, making sure the side-port location matched that of the pump. Both, the pump and the template were held for stability. The needle provided in the Medtronic Kit was then introduced thru the center of the template and into the central port. The pump content was aspirated and discarded volume documented. The new medication was slowly infused into the pump, thru the filter, making sure to avoid overpressure of the device. The needle was then removed and the area cleansed, making sure to leave some of the prepping solution back to take advantage of its long term bactericidal properties. The pump was interrogated and programmed to reflect the correct medication, volume, and dosage. The program was printed and taken  to the physician for approval. Once checked and signed by the physician, a copy was provided to the patient and another scanned into the EMR. Vitals:   01/22/21 1329  BP: (!) 162/96  Pulse: (!) 121  Resp: 18  Temp: (!) 96.6 F (35.9 C)  SpO2: 100%  Weight: (!) 325 lb (147.4 kg)  Height: _0  (1.803 m)    Start Time: 1353 hrs. End Time: 1402 hrs. Materials & Medications: Medtronic Refill Kit Medication(s): Please see chart orders for details.  Imaging Guidance:          Type of Imaging Technique: None used Indication(s): N/A Exposure Time: No patient exposure Contrast: None used. Fluoroscopic Guidance: N/A Ultrasound Guidance: N/A Interpretation: N/A  Antibiotic Prophylaxis:   Anti-infectives (From admission, onward)    None      Indication(s): None identified  Post-operative Assessment:  Post-procedure Vital Signs:  Pulse/HCG Rate: (!) 121  Temp: (!) 96.6 F (35.9 C) Resp: 18 BP: (!) 162/96 SpO2: 100  %  EBL: None  Complications: No immediate post-treatment complications observed by team, or reported by patient.  Note: The patient tolerated the entire procedure well. A repeat set of vitals were taken after the procedure and the patient was kept under observation following institutional policy, for this type of procedure. Post-procedural neurological assessment was performed, showing return to baseline, prior to discharge. The patient was provided with post-procedure discharge instructions, including a section on how to identify potential problems. Should any problems arise concerning this procedure, the patient was given instructions to immediately contact us, at any time, without hesitation. In any case, we plan to contact the patient by telephone for a follow-up status report regarding this interventional procedure.  Comments:  No additional relevant information.  Plan of Care  Orders:  Orders Placed This Encounter  Procedures   PUMP REFILL    Maintain Protocol by having two(2) healthcare providers during procedure and programming.    Scheduling Instructions:     Please refill intrathecal pump today.    Order Specific Question:   Where will this procedure be performed?    Answer:   [facility]   PUMP REFILL    Whenever possible schedule on a procedure today.    Standing Status:   Future    Standing Expiration Date:   05/25/2021    Scheduling Instructions:     Please schedule intrathecal pump refill based on pump programming. Avoid schedule intervals of more than 120 days (4 months).    Order Specific Question:   Where will this procedure be performed?    Answer:   [facility]   MR THORACIC SPINE WO CONTRAST    Patient presents with axial pain with possible radicular component. Please assist [place] in identifying specific level(s) and laterality of any additional findings such as: 1. Facet (Zygapophyseal) joint DJD (Hypertrophy, space narrowing, subchondral sclerosis,  and/or osteophyte formation) 2. DDD and/or IVDD (Loss of disc height, desiccation, gas patterns, osteophytes, endplate sclerosis, or "Black disc disease") 3. Pars defects 4. Spondylolisthesis, spondylosis, and/or spondyloarthropathies (include Degree/Grade of displacement in mm) (stability) 5. Vertebral body Fractures (acute/chronic) (state percentage of collapse) 6. Demineralization (osteopenia/osteoporotic) 7. Bone pathology 8. Foraminal narrowing  9. Surgical changes 10. Central, Lateral Recess, and/or Foraminal Stenosis (include AP diameter of stenosis in mm) 11. Surgical changes (hardware type, status, and presence of fibrosis) 12. Modic Type Changes (MRI only) 13. IVDD (Disc bulge, protrusion, herniation, extrusion) (Level, laterality, extent)    Standing Status:  Future    Standing Expiration Date:   02/22/2021    Scheduling Instructions:     Imaging must be done as soon as possible. Inform patient that order will expire within 30 days and I will not renew it.    Order Specific Question:   What is the patient's sedation requirement?    Answer:   No Sedation    Order Specific Question:   Does the patient have a pacemaker or implanted devices?    Answer:   No    Order Specific Question:   Preferred imaging location?    Answer:   [facility] (table limit-350lbs)    Order Specific Question:   Call Results- Best Contact Number?    Answer:   [phone] ([facility])    Order Specific Question:   Radiology Contrast Protocol - do NOT remove file path    Answer:   \\[email or web address]   MR LUMBAR SPINE WO CONTRAST    Patient presents with axial pain with possible radicular component. Please assist [place] in identifying specific level(s) and laterality of any additional findings such as: 1. Facet (Zygapophyseal) joint DJD (Hypertrophy, space narrowing, subchondral sclerosis, and/or osteophyte formation) 2. DDD and/or IVDD (Loss of disc height,  desiccation, gas patterns, osteophytes, endplate sclerosis, or "Black disc disease") 3. Pars defects 4. Spondylolisthesis, spondylosis, and/or spondyloarthropathies (include Degree/Grade of displacement in mm) (stability) 5. Vertebral body Fractures (acute/chronic) (state percentage of collapse) 6. Demineralization (osteopenia/osteoporotic) 7. Bone pathology 8. Foraminal narrowing  9. Surgical changes 10. Central, Lateral Recess, and/or Foraminal Stenosis (include AP diameter of stenosis in mm) 11. Surgical changes (hardware type, status, and presence of fibrosis) 12. Modic Type Changes (MRI only) 13. IVDD (Disc bulge, protrusion, herniation, extrusion) (Level, laterality, extent)    Standing Status:   Future    Standing Expiration Date:   02/22/2021    Scheduling Instructions:     Imaging must be done as soon as possible. Inform patient that order will expire within 30 days and I will not renew it.    Order Specific Question:   What is the patient's sedation requirement?    Answer:   No Sedation    Order Specific Question:   Does the patient have a pacemaker or implanted devices?    Answer:   No    Order Specific Question:   Preferred imaging location?    Answer:   [facility] (table limit-350lbs)    Order Specific Question:   Call Results- Best Contact Number?    Answer:   [phone] ([facility])    Order Specific Question:   Radiology Contrast Protocol - do NOT remove file path    Answer:   \\[email or web address]   Ambulatory referral to Physical Therapy    Referral Priority:   Routine    Referral Type:   Physical Medicine    Referral Reason:   Specialty Services Required    Requested Specialty:   Physical Therapy    Number of Visits Requested:   1   Informed Consent Details: Physician/Practitioner Attestation; Transcribe to consent form and obtain patient signature    Transcribe to consent form and obtain patient signature.    Order  Specific Question:   Physician/Practitioner attestation of informed consent for procedure/surgical case    Answer:   I, the physician/practitioner, attest that I have discussed with the patient the benefits, risks, side effects, alternatives, likelihood of achieving goals and potential problems during recovery for the procedure that I have provided informed consent.  Order Specific Question:   Procedure    Answer:   Intrathecal pump refill    Order Specific Question:   Physician/Practitioner performing the procedure    Answer:   Attending Physician: Kathlen Brunswick. Dossie Arbour, MD & designated trained staff    Order Specific Question:   Indication/Reason    Answer:   Chronic Pain Syndrome (G89.4), presence of an intrathecal pump (Z97.8)   NCV with EMG(electromyography)    Bilateral testing requested.    Standing Status:   Future    Standing Expiration Date:   03/24/2021    Scheduling Instructions:     Please refer this patient to [facility] for Nerve Conduction testing of the lower extremities. (EMG & PNCV)    Order Specific Question:   Where should this test be performed?    Answer:   other    Order Specific Question:   Release to patient    Answer:   Immediate    Chronic Opioid Analgesic:  Oxycodone IR 10 mg, 1 tab PO q 6 hrs (40 mg/day of oxycodone) + 52.9 mcg/hr intrathecal PF-Fentanyl (1,268.8 mcg/day of Fentanyl) MME/day: 60 mg/day (from oral medication) + 126.96 mg/day (from intrathecal fentanyl) = 186.96 MME/day (Aprox. 1.08 MME/day/kg).   Medications ordered for procedure: Meds ordered this encounter  Medications   morphine (MS CONTIN) 30 MG 12 hr tablet    Sig: Take 1 tablet (30 mg total) by mouth every 12 (twelve) hours. Must last 30 days. Do not break tablet    Dispense:  60 tablet    Refill:  0    DO NOT: delete (not duplicate); no partial-fill (will deny script to complete), no refill request (F/U required). DISPENSE: 1 day early if closed on fill date. WARN: No  CNS-depressants within 8 hrs of med.   morphine (MS CONTIN) 30 MG 12 hr tablet    Sig: Take 1 tablet (30 mg total) by mouth every 12 (twelve) hours. Must last 30 days. Do not break tablet    Dispense:  60 tablet    Refill:  0    DO NOT: delete (not duplicate); no partial-fill (will deny script to complete), no refill request (F/U required). DISPENSE: 1 day early if closed on fill date. WARN: No CNS-depressants within 8 hrs of med.    Medications administered: Jeneen Rinks B. Tester had no medications administered during this visit.  See the medical record for exact dosing, route, and time of administration.  Follow-up plan:   Return for Pump Refill (Max:56mo.       Interventional management options: Considering:   NOTE: NO RFA until BMI <35  Palliative/therapeutic intrathecal pump refill and maintenance  Diagnostic left Genicular NB  Possible left Genicular nerve RFA  Possible bilateral lumbar facet RFA (NO Lumbar RFA until BMI <35) Diagnostic caudal ESI + epidurogram  Possible Racz procedure  Diagnostic bilateral IA shoulder joint injection  Diagnostic bilateral suprascapular NB  Possible bilateral suprascapular nerve RFA  Diagnostic LESI vs TFESI    Palliative PRN treatment(s):   Palliative/therapeutic intrathecal pump refill and maintenance  Diagnostic bilateral lumbar facet block #2           Recent Visits Date Type Provider Dept  11/22/20 Procedure visit NMilinda Pointer MD [facility]  Showing recent visits within past 90 days and meeting all other requirements Today's Visits Date Type Provider Dept  01/22/21 Procedure visit NMilinda Pointer MD [facility]  Showing today's visits and meeting all other requirements Future Appointments Date Type Provider  Dept  03/19/21 Appointment Milinda Pointer, MD [facility]  Showing future appointments within next 90 days and meeting all other requirements Disposition: Discharge home  Discharge  (Date  Time): 01/22/2021; 1415 hrs.   Primary Care Physician: Antony Contras, MD Location: [facility] Note by: Gaspar Cola, MD Date: 01/22/2021; Time: 6:38 PM  Disclaimer:  Medicine is not an Chief Strategy Officer. The only guarantee in medicine is that nothing is guaranteed. It is important to note that the decision to proceed with this intervention was based on the information collected from the patient. The Data and conclusions were drawn from the patient's questionnaire, the interview, and the physical examination. Because the information was provided in large part by the patient, it cannot be guaranteed that it has not been purposely or unconsciously manipulated. Every effort has been made to obtain as much relevant data as possible for this evaluation. It is important to note that the conclusions that lead to this procedure are derived in large part from the available data. Always take into account that the treatment will also be dependent on availability of resources and existing treatment guidelines, considered by other Pain Management Practitioners as being common knowledge and practice, at the time of the intervention. For Medico-Legal purposes, it is also important to point out that variation in procedural techniques and pharmacological choices are the acceptable norm. The indications, contraindications, technique, and results of the above procedure should only be interpreted and judged by a Board-Certified Interventional Pain Specialist with extensive familiarity and expertise in the same exact procedure and technique.

## 2021-01-22 ENCOUNTER — Ambulatory Visit: Payer: No Typology Code available for payment source | Attending: Pain Medicine | Admitting: Pain Medicine

## 2021-01-22 ENCOUNTER — Other Ambulatory Visit: Payer: Self-pay

## 2021-01-22 VITALS — BP 162/96 | HR 121 | Temp 96.6°F | Resp 18 | Ht 71.0 in | Wt 325.0 lb

## 2021-01-22 DIAGNOSIS — G96198 Other disorders of meninges, not elsewhere classified: Secondary | ICD-10-CM | POA: Diagnosis present

## 2021-01-22 DIAGNOSIS — Z79899 Other long term (current) drug therapy: Secondary | ICD-10-CM | POA: Insufficient documentation

## 2021-01-22 DIAGNOSIS — M4727 Other spondylosis with radiculopathy, lumbosacral region: Secondary | ICD-10-CM | POA: Diagnosis present

## 2021-01-22 DIAGNOSIS — R29898 Other symptoms and signs involving the musculoskeletal system: Secondary | ICD-10-CM | POA: Insufficient documentation

## 2021-01-22 DIAGNOSIS — Z451 Encounter for adjustment and management of infusion pump: Secondary | ICD-10-CM | POA: Diagnosis present

## 2021-01-22 DIAGNOSIS — M79605 Pain in left leg: Secondary | ICD-10-CM | POA: Diagnosis present

## 2021-01-22 DIAGNOSIS — M5134 Other intervertebral disc degeneration, thoracic region: Secondary | ICD-10-CM | POA: Diagnosis present

## 2021-01-22 DIAGNOSIS — M961 Postlaminectomy syndrome, not elsewhere classified: Secondary | ICD-10-CM | POA: Diagnosis present

## 2021-01-22 DIAGNOSIS — Z79891 Long term (current) use of opiate analgesic: Secondary | ICD-10-CM | POA: Diagnosis present

## 2021-01-22 DIAGNOSIS — Z978 Presence of other specified devices: Secondary | ICD-10-CM | POA: Insufficient documentation

## 2021-01-22 DIAGNOSIS — M25562 Pain in left knee: Secondary | ICD-10-CM | POA: Insufficient documentation

## 2021-01-22 DIAGNOSIS — Z6841 Body Mass Index (BMI) 40.0 and over, adult: Secondary | ICD-10-CM | POA: Insufficient documentation

## 2021-01-22 DIAGNOSIS — Z5189 Encounter for other specified aftercare: Secondary | ICD-10-CM | POA: Diagnosis present

## 2021-01-22 DIAGNOSIS — M79604 Pain in right leg: Secondary | ICD-10-CM | POA: Insufficient documentation

## 2021-01-22 DIAGNOSIS — G8929 Other chronic pain: Secondary | ICD-10-CM

## 2021-01-22 DIAGNOSIS — M5137 Other intervertebral disc degeneration, lumbosacral region: Secondary | ICD-10-CM | POA: Insufficient documentation

## 2021-01-22 DIAGNOSIS — M545 Low back pain, unspecified: Secondary | ICD-10-CM

## 2021-01-22 DIAGNOSIS — G894 Chronic pain syndrome: Secondary | ICD-10-CM | POA: Diagnosis present

## 2021-01-22 DIAGNOSIS — M47816 Spondylosis without myelopathy or radiculopathy, lumbar region: Secondary | ICD-10-CM | POA: Insufficient documentation

## 2021-01-22 DIAGNOSIS — Z96652 Presence of left artificial knee joint: Secondary | ICD-10-CM | POA: Diagnosis present

## 2021-01-22 DIAGNOSIS — M5136 Other intervertebral disc degeneration, lumbar region: Secondary | ICD-10-CM | POA: Diagnosis present

## 2021-01-22 MED ORDER — MORPHINE SULFATE ER 30 MG PO TBCR: 30.0000 mg | EXTENDED_RELEASE_TABLET | Freq: Two times a day (BID) | ORAL | 0 refills | Status: DC

## 2021-01-22 NOTE — Patient Instructions (Signed)

## 2021-01-22 NOTE — Progress Notes (Signed)
Safety precautions to be maintained throughout the outpatient stay will include: orient to surroundings, keep bed in low position, maintain call bell within reach at all times, provide assistance with transfer out of bed and ambulation.  

## 2021-01-23 ENCOUNTER — Telehealth: Payer: Self-pay

## 2021-01-23 NOTE — Telephone Encounter (Signed)
Called Post ITpump refill. Denies any needs at this time. Patient states he has already received a call from the nerve conduction test

## 2021-01-28 ENCOUNTER — Encounter: Payer: Medicare HMO | Admitting: Pain Medicine

## 2021-01-29 MED FILL — Medication: INTRATHECAL | Qty: 1 | Status: AC

## 2021-02-21 ENCOUNTER — Other Ambulatory Visit: Payer: Self-pay

## 2021-02-21 MED ORDER — PAIN MANAGEMENT IT PUMP REFILL: 1.0000 | Freq: Once | INTRATHECAL | 0 refills | Status: AC

## 2021-03-14 ENCOUNTER — Telehealth: Payer: Self-pay

## 2021-03-14 NOTE — Telephone Encounter (Signed)
Pt was not unable to pick up Rx it was told he cannot pick up until the 19th and he will be out tomorrow.Marland Kitchen

## 2021-03-14 NOTE — Telephone Encounter (Signed)
Called pharmacy and they have found the November Rx that can be filled now and they are waiting on workers comp to approve it.  Called to let patient know the situation. Patient verbalizes u/o information.

## 2021-03-19 ENCOUNTER — Encounter: Payer: Self-pay | Admitting: Pain Medicine

## 2021-03-19 ENCOUNTER — Ambulatory Visit: Payer: No Typology Code available for payment source | Attending: Pain Medicine | Admitting: Pain Medicine

## 2021-03-19 ENCOUNTER — Other Ambulatory Visit: Payer: Self-pay

## 2021-03-19 VITALS — BP 108/85 | HR 80 | Temp 97.9°F | Resp 22 | Ht 71.0 in | Wt 300.0 lb

## 2021-03-19 DIAGNOSIS — Z451 Encounter for adjustment and management of infusion pump: Secondary | ICD-10-CM | POA: Insufficient documentation

## 2021-03-19 DIAGNOSIS — I509 Heart failure, unspecified: Secondary | ICD-10-CM | POA: Diagnosis not present

## 2021-03-19 DIAGNOSIS — F1190 Opioid use, unspecified, uncomplicated: Secondary | ICD-10-CM

## 2021-03-19 DIAGNOSIS — M25562 Pain in left knee: Secondary | ICD-10-CM | POA: Diagnosis not present

## 2021-03-19 DIAGNOSIS — K219 Gastro-esophageal reflux disease without esophagitis: Secondary | ICD-10-CM | POA: Insufficient documentation

## 2021-03-19 DIAGNOSIS — M545 Low back pain, unspecified: Secondary | ICD-10-CM

## 2021-03-19 DIAGNOSIS — Z96652 Presence of left artificial knee joint: Secondary | ICD-10-CM | POA: Insufficient documentation

## 2021-03-19 DIAGNOSIS — E119 Type 2 diabetes mellitus without complications: Secondary | ICD-10-CM | POA: Diagnosis not present

## 2021-03-19 DIAGNOSIS — M159 Polyosteoarthritis, unspecified: Secondary | ICD-10-CM | POA: Insufficient documentation

## 2021-03-19 DIAGNOSIS — M5137 Other intervertebral disc degeneration, lumbosacral region: Secondary | ICD-10-CM | POA: Diagnosis not present

## 2021-03-19 DIAGNOSIS — M79605 Pain in left leg: Secondary | ICD-10-CM

## 2021-03-19 DIAGNOSIS — Z79891 Long term (current) use of opiate analgesic: Secondary | ICD-10-CM | POA: Insufficient documentation

## 2021-03-19 DIAGNOSIS — M5117 Intervertebral disc disorders with radiculopathy, lumbosacral region: Secondary | ICD-10-CM | POA: Insufficient documentation

## 2021-03-19 DIAGNOSIS — M153 Secondary multiple arthritis: Secondary | ICD-10-CM

## 2021-03-19 DIAGNOSIS — G8929 Other chronic pain: Secondary | ICD-10-CM

## 2021-03-19 DIAGNOSIS — G894 Chronic pain syndrome: Secondary | ICD-10-CM | POA: Diagnosis not present

## 2021-03-19 DIAGNOSIS — G96198 Other disorders of meninges, not elsewhere classified: Secondary | ICD-10-CM | POA: Diagnosis not present

## 2021-03-19 DIAGNOSIS — R531 Weakness: Secondary | ICD-10-CM | POA: Insufficient documentation

## 2021-03-19 DIAGNOSIS — Z6841 Body Mass Index (BMI) 40.0 and over, adult: Secondary | ICD-10-CM | POA: Diagnosis not present

## 2021-03-19 DIAGNOSIS — R29898 Other symptoms and signs involving the musculoskeletal system: Secondary | ICD-10-CM

## 2021-03-19 DIAGNOSIS — Z79899 Other long term (current) drug therapy: Secondary | ICD-10-CM | POA: Insufficient documentation

## 2021-03-19 DIAGNOSIS — M961 Postlaminectomy syndrome, not elsewhere classified: Secondary | ICD-10-CM | POA: Insufficient documentation

## 2021-03-19 DIAGNOSIS — Z978 Presence of other specified devices: Secondary | ICD-10-CM | POA: Diagnosis not present

## 2021-03-19 DIAGNOSIS — G4733 Obstructive sleep apnea (adult) (pediatric): Secondary | ICD-10-CM | POA: Insufficient documentation

## 2021-03-19 DIAGNOSIS — M4727 Other spondylosis with radiculopathy, lumbosacral region: Secondary | ICD-10-CM | POA: Insufficient documentation

## 2021-03-19 DIAGNOSIS — M47816 Spondylosis without myelopathy or radiculopathy, lumbar region: Secondary | ICD-10-CM | POA: Insufficient documentation

## 2021-03-19 MED ORDER — MORPHINE SULFATE ER 30 MG PO TBCR: 30.0000 mg | EXTENDED_RELEASE_TABLET | Freq: Two times a day (BID) | ORAL | 0 refills | Status: DC

## 2021-03-19 NOTE — Progress Notes (Signed)
PROVIDER NOTE: Information contained herein reflects review and annotations entered in association with encounter. Interpretation of such information and data should be left to medically-trained personnel. Information provided to patient can be located elsewhere in the medical record under "Patient Instructions". Document created using STT-dictation technology, any transcriptional errors that may result from process are unintentional.    Patient: [name]  Service Category: Procedure Provider: Gaspar Cola, MD DOB: 1965/07/27 DOS: 03/19/2021 Location: [facility] MRN: [id number] Setting: Ambulatory - outpatient Referring Provider: Antony Contras, MD Type: Established Patient Specialty: Interventional Pain Management PCP: Antony Contras, MD  Primary Reason for Visit: Interventional Pain Management Treatment. CC: Back Pain (lower)  Procedure:          Type: Management of Intrathecal Drug Delivery System (IDDS) - Reservoir Refill [phone]). No rate change.  Indications: 1. Chronic pain syndrome   2. Chronic low back pain (1ry area of Pain) (Bilateral) (R>L) w/o sciatica   3. Chronic lower extremity pain (2ry area of Pain) (Bilateral) (L>R)   4. Chronic lower extremity radicular pain   5. DDD (degenerative disc disease), lumbosacral   6. Failed back surgical syndrome   7. Epidural fibrosis   8. Chronic hip pain (Right)   9. Chronic knee pain after total replacement (Left)   10. Lumbar facet syndrome (Bilateral) (R>L)   11. Lumbosacral spondylosis with radiculopathy   12. Weakness of back   13. Weakness of lower extremities (Bilateral)   14. Secondary osteoarthritis of multiple sites   15. Obesity, morbid (more than 100 lbs over ideal weight or BMI > 40) (HCC)   16. Presence of intrathecal pump (Medtronics)   17. Physical tolerance to opiate drug   18. Pharmacologic therapy   19. Chronic use of opiate for therapeutic purpose   20. Encounter for chronic pain  management   21. Encounter for medication management    Pain Assessment: Self-Reported Pain Score: 10-Worst pain ever/10             Reported level is compatible with observation.        Note: For the longest and we have been having a lot of difficulty getting this patient's pain under control.  As it turns out, the patient has medical problems are not negatively contributing to our inability to get this pain under control as much as Worker's Compensation.  It turns out that [employer] will not allow for his oral medications to be preapproved until the day that they are due and then it takes them several days to respond and get it approved which leads to the patient having intermittent periods of time where he goes without the pain medication.  This in turn increases the patient's pain leading to intermittent periods of increased pain, increased release of stress hormones which in turn increases blood pressure, because this muscle contraction, muscle fatigue, increases in inflammatory response, which then takes forever to bring down and get on the control once he does get his medicine this.  As a board-certified pain specialist, it is my professional opinion that something needs to be done to make sure that the process of getting his medications is straightened out so that there are no gaps in his treatment.  Not only this, but the fact that they are not providing the patient with his medications on the day that the medication is due keeps screwing up our ability to monitor this patient's controlled substance use.  This also seems to be causing problems at the  level of the pharmacy since I keep getting phone calls from the patient indicated that he has not being able to get his medication because he was told that there was no prescription for the particular month, and then when we checked our records we found that he indeed had sufficient prescriptions.  We then had to call the pharmacy who  realized that because of this delays, they had actually looked at the following month prescription, which was actually and obviously not due.  We have looked to see in which ways we can help this patient get his medications in a timely manner, but the fact of the matter is that there is absolutely nothing else we can do since did this is a Worker's Compensation created problem.  RTCB: 06/30/2021   Intrathecal Drug Delivery System (IDDS)  Pump Device:  Manufacturer: Medtronic Model: Synchromed II Model No.: 8637-40 Serial No.: [id number] Delivery Route: Intrathecal Type: Programmable  Volume (mL): 40 mL reservoir Priming Volume: n/a  Calibration Constant: 117.0  MRI compatibility: Conditional   Implant Details:  Date: 2009 Implanter: Mark Roy, MD (implant done at [facility]) Contact Information: n/a Last Revision/Replacement: n/a Estimated Replacement Date: Apr/2023  Implant Site: Abdominal Laterality: Left  Catheter: Manufacturer: Medtronic Model:  n/a  Model No.: n/a  Serial No.: n/a  Implanted Length (cm): n/a  Catheter Volume (mL): 0.145  Tip Location (Level): T7 Canal Access Site: L1-2 (R)  Drug content:  Primary Medication Class: Opioid  Medication: PF-Fentanyl  Concentration: 2,000.0 mcg/mL   Secondary Medication Class: Local Anesthetic  Medication: PF-Bupivacaine  Concentration: 30 mg/mL   Tertiary Medication Class: Antispasmodic  Medication: PF-Baclofen  Concentration: 200 mcg/mL   Fourth Medication Class: none   PA parameters (PCA-mode):  Mode: Off (Inactive)  Programming:  Type: Simple continuous.  Medication, Concentration, Infusion Program, & Delivery Rate: For up-to-date details please see most recent scanned programming printout.   Changes:  Medication Change: None at this point Rate Change: No change in rate  Reported side-effects or adverse reactions: None reported  Effectiveness: Described as relatively effective, allowing  for increase in activities of daily living (ADL) Clinically meaningful improvement in function (CMIF): Sustained CMIF goals met  Plan: Pump refill today   Pharmacotherapy Assessment   Analgesic: Oxycodone IR 10 mg, 1 tab PO q 6 hrs (40 mg/day of oxycodone) + 52.9 mcg/hr intrathecal PF-Fentanyl (1,268.8 mcg/day of Fentanyl) MME/day: 60 mg/day (from oral medication) + 126.96 mg/day (from intrathecal fentanyl) = 186.96 MME/day (Aprox. 1.08 MME/day/kg).   Monitoring: [place] PMP: PDMP reviewed during this encounter.       Pharmacotherapy: No side-effects or adverse reactions reported. Compliance: No problems identified. Effectiveness: Clinically acceptable. Plan: Refer to "POC". UDS:  Summary  Date Value Ref Range Status  09/20/2020 Note  Final    Comment:    ==================================================================== [employer] Select 13 (MW) ==================================================================== Test                             Result       Flag       Units  Drug Present and Declared for Prescription Verification   Morphine                       3885         EXPECTED   ng/mg creat   Normorphine                      68           EXPECTED   ng/mg creat    Potential sources of large amounts of morphine in the absence of    codeine include administration of morphine or use of heroin.     Normorphine is an expected metabolite of morphine.    Fentanyl                       29           EXPECTED   ng/mg creat   Norfentanyl                    185          EXPECTED   ng/mg creat    Source of fentanyl is a scheduled prescription medication, including    IV, patch, and transmucosal formulations. Norfentanyl is an expected    metabolite of fentanyl.  Drug Present not Declared for Prescription Verification   Hydromorphone                  212          UNEXPECTED ng/mg creat    Hydromorphone may be administered as a scheduled prescription    medication and is also an expected  metabolite of hydrocodone.    Hydromorphone is also a minor metabolite of morphine which is also    present in this specimen. Concentrations of hydromorphone rarely    exceed 5% of the morphine concentration when metabolism of morphine    is the sole source of hydromorphone.  ==================================================================== Test                      Result    Flag   Units      Ref Range   Creatinine              188              mg/dL      >=20 ==================================================================== Declared Medications:  The flagging and interpretation on this report are based on the  following declared medications.  Unexpected results may arise from  inaccuracies in the declared medications.   **Note: The testing scope of this panel includes these medications:   Fentanyl  Morphine   **Note: The testing scope of this panel does not include the  following reported medications:   Acetaminophen  Aspirin  Baclofen  Bupivacaine  Calcium  Cholecalciferol  Cyclobenzaprine  Escitalopram  Esomeprazole  Gabapentin  Ibuprofen  Metolazone  Naloxone  Potassium Chloride  Pregabalin  Promethazine  Rosuvastatin  Tizanidine  Torsemide ==================================================================== For clinical consultation, please call [phone]. ====================================================================      Pre-op H&P Assessment:  Mr. Salak is a 55 y.o. (year old), male patient, seen today for interventional treatment. He  has a past surgical history that includes Back surgery; morphine pump; Tonsillectomy; Joint replacement; Shoulder arthroscopy with subacromial decompression, rotator cuff repair and bicep tendon repair (Right, 08/18/2013); Shoulder arthroscopy with subacromial decompression and bicep tendon repair (Left, 10/06/2013); Laparoscopic gastric sleeve resection (N/A, 01/08/2015); and RIGHT HEART CATH (N/A, 09/22/2019).  Mr. Steely has a current medication list which includes the following prescription(s): acetaminophen, aspirin ec, escitalopram, gabapentin, metolazone, [START ON 04/01/2021] morphine, naloxone, NON FORMULARY, potassium chloride, promethazine, rosuvastatin, torsemide, calcium carbonate, vitamin d3, cyclobenzaprine, esomeprazole, ibuprofen, [START ON 05/01/2021] morphine, [START ON 05/31/2021] morphine, pregabalin, and tizanidine. His primarily concern today is the Back Pain (lower)    Initial Vital Signs:  Pulse/HCG Rate: 80  Temp: 97.9 F (36.6 C) Resp: (!) 22 BP: 108/85 SpO2: 100 %  BMI: Estimated body mass index is 41.84 kg/m as calculated from the following:   Height as of this encounter: 5' 11" (1.803 m).   Weight as of this encounter: 300 lb (136.1 kg).  Risk Assessment: Allergies: Reviewed. He is allergic to tape.  Allergy Precautions: None required Coagulopathies: Reviewed. None identified.  Blood-thinner therapy: None at this time Active Infection(s): Reviewed. None identified. Mr. Raptis is afebrile  Site Confirmation: Mr. Neyland was asked to confirm the procedure and laterality before marking the site Procedure checklist: Completed Consent: Before the procedure and under the influence of no sedative(s), amnesic(s), or anxiolytics, the patient was informed of the treatment options, risks and possible complications. To fulfill our ethical and legal obligations, as recommended by the American Medical Association's Code of Ethics, I have informed the patient of my clinical impression; the nature and purpose of the treatment or procedure; the risks, benefits, and possible complications of the intervention; the alternatives, including doing nothing; the risk(s) and benefit(s) of the alternative treatment(s) or procedure(s); and the risk(s) and benefit(s) of doing nothing.  Mr. Bencomo was provided with information about the general risks and possible complications associated with most  interventional procedures. These include, but are not limited to: failure to achieve desired goals, infection, bleeding, organ or nerve damage, allergic reactions, paralysis, and/or death.  In addition, he was informed of those risks and possible complications associated to this particular procedure, which include, but are not limited to: damage to the implant; failure to decrease pain; local, systemic, or serious CNS infections, intraspinal abscess with possible cord compression and paralysis, or life-threatening such as meningitis; bleeding; organ damage; nerve injury or damage with subsequent sensory, motor, and/or autonomic system dysfunction, resulting in transient or permanent pain, numbness, and/or weakness of one or several areas of the body; allergic reactions, either minor or major life-threatening, such as anaphylactic or anaphylactoid reactions.  Furthermore, Mr. Hawker was informed of those risks and complications associated with the medications. These include, but are not limited to: allergic reactions (i.e.: anaphylactic or anaphylactoid reactions); endorphine suppression; bradycardia and/or hypotension; water retention and/or peripheral vascular relaxation leading to lower extremity edema and possible stasis ulcers; respiratory depression and/or shortness of breath; decreased metabolic rate leading to weight gain; swelling or edema; medication-induced neural toxicity; particulate matter embolism and blood vessel occlusion with resultant organ, and/or nervous system infarction; and/or intrathecal granuloma formation with possible spinal cord compression and permanent paralysis.  Before refilling the pump Mr. Repass was informed that some of the medications used in the devise may not be FDA approved for such use and therefore it constitutes an off-label use of the medications.  Finally, he was informed that Medicine is not an exact science; therefore, there is also the possibility of  unforeseen or unpredictable risks and/or possible complications that may result in a catastrophic outcome. The patient indicated having understood very clearly. We have given the patient no guarantees and we have made no promises. Enough time was given to the patient to ask questions, all of which were answered to the patient's satisfaction. Mr. Usrey has indicated that he wanted to continue with the procedure. Attestation: I, the ordering provider, attest that I have discussed with the patient the benefits, risks, side-effects, alternatives, likelihood of achieving goals, and potential problems during recovery for the procedure that I have provided informed consent. Date  Time: 03/19/2021  1:08 PM  Pre-Procedure Preparation:  Monitoring: As per clinic protocol. Respiration, ETCO2, SpO2, BP, heart rate and rhythm monitor placed and checked for adequate function Safety Precautions: Patient was assessed for positional comfort and pressure points before starting the procedure. Time-out: I initiated and conducted the "Time-out" before starting the procedure, as per protocol. The patient was asked to participate by confirming the accuracy of the "Time Out" information. Verification of the correct person, site, and procedure were performed and confirmed by me, the nursing staff, and the patient. "Time-out" conducted as per Joint Commission's Universal Protocol (UP.01.01.01). Time: 1313  Description of Procedure:          Position: Supine Target Area: Central-port of intrathecal pump. Approach: Anterior, 90 degree angle approach. Area Prepped: Entire Area around the pump implant. DuraPrep (Iodine Povacrylex [0.7% available iodine] and Isopropyl Alcohol, 74% w/w) Safety Precautions: Aspiration looking for blood return was conducted prior to all injections. At no point did we inject any substances, as a needle was being advanced. No attempts were made at seeking any paresthesias. Safe injection practices  and needle disposal techniques used. Medications properly checked for expiration dates. SDV (single dose vial) medications used. Description of the Procedure: Protocol guidelines were followed. Two nurses trained to do implant refills were present during the entire procedure. The refill medication was checked by both healthcare providers as well as the patient. The patient was included in the "Time-out" to verify the medication. The patient was placed in position. The pump was identified. The area was prepped in the usual manner. The sterile template was positioned over the pump, making sure the side-port location matched that of the pump. Both, the pump and the template were held for stability. The needle provided in the Medtronic Kit was then introduced thru the center of the template and into the central port. The pump content was aspirated and discarded volume documented. The new medication was slowly infused into the pump, thru the filter, making sure to avoid overpressure of the device. The needle was then removed and the area cleansed, making sure to leave some of the prepping solution back to take advantage of its long term bactericidal properties. The pump was interrogated and programmed to reflect the correct medication, volume, and dosage. The program was printed and taken to the physician for approval. Once checked and signed by the physician, a copy was provided to the patient and another scanned into the EMR.  Vitals:   03/19/21 1303  BP: 108/85  Pulse: 80  Resp: (!) 22  Temp: 97.9 F (36.6 C)  TempSrc: Temporal  SpO2: 100%  Weight: 300 lb (136.1 kg)  Height: 5' 11" (1.803 m)    Start Time: 1314 hrs. End Time: 1322 hrs. Materials & Medications: Medtronic Refill Kit Medication(s): Please see chart orders for details.  Imaging Guidance:          Type of Imaging Technique: None used Indication(s): N/A Exposure Time: No patient exposure Contrast: None used. Fluoroscopic Guidance:  N/A Ultrasound Guidance: N/A Interpretation: N/A  Antibiotic Prophylaxis:   Anti-infectives (From admission, onward)    None      Indication(s): None identified  Post-operative Assessment:  Post-procedure Vital Signs:  Pulse/HCG Rate: 80  Temp: 97.9 F (36.6 C) Resp: (!) 22 BP: 108/85 SpO2: 100 %  EBL: None  Complications: No immediate post-treatment complications observed by team, or reported by patient.  Note: The patient tolerated the entire procedure well. A repeat set of vitals were taken after the procedure  and the patient was kept under observation following institutional policy, for this type of procedure. Post-procedural neurological assessment was performed, showing return to baseline, prior to discharge. The patient was provided with post-procedure discharge instructions, including a section on how to identify potential problems. Should any problems arise concerning this procedure, the patient was given instructions to immediately contact us, at any time, without hesitation. In any case, we plan to contact the patient by telephone for a follow-up status report regarding this interventional procedure.  Comments:  No additional relevant information.  Plan of Care  Orders:  Orders Placed This Encounter  Procedures   PUMP REFILL    Maintain Protocol by having two(2) healthcare providers during procedure and programming.    Scheduling Instructions:     Please refill intrathecal pump today.    Order Specific Question:   Where will this procedure be performed?    Answer:   [facility]   PUMP REFILL    Whenever possible schedule on a procedure today.    Standing Status:   Future    Standing Expiration Date:   07/18/2021    Scheduling Instructions:     Please schedule intrathecal pump refill based on pump programming. Avoid schedule intervals of more than 120 days (4 months).    Order Specific Question:   Where will this procedure be performed?    Answer:    [facility]   Ambulatory referral to Neurosurgery    Referral Priority:   Routine    Referral Type:   Surgical    Referral Reason:   Specialty Services Required    Referred to Provider:   Deetta Perla, MD    Requested Specialty:   Neurosurgery    Number of Visits Requested:   1   Informed Consent Details: Physician/Practitioner Attestation; Transcribe to consent form and obtain patient signature    Transcribe to consent form and obtain patient signature.    Order Specific Question:   Physician/Practitioner attestation of informed consent for procedure/surgical case    Answer:   I, the physician/practitioner, attest that I have discussed with the patient the benefits, risks, side effects, alternatives, likelihood of achieving goals and potential problems during recovery for the procedure that I have provided informed consent.    Order Specific Question:   Procedure    Answer:   Intrathecal pump refill    Order Specific Question:   Physician/Practitioner performing the procedure    Answer:   Attending Physician: Kathlen Brunswick. Dossie Arbour, MD & designated trained staff    Order Specific Question:   Indication/Reason    Answer:   Chronic Pain Syndrome (G89.4), presence of an intrathecal pump (Z97.8)    Chronic Opioid Analgesic:  Oxycodone IR 10 mg, 1 tab PO q 6 hrs (40 mg/day of oxycodone) + 52.9 mcg/hr intrathecal PF-Fentanyl (1,268.8 mcg/day of Fentanyl) MME/day: 60 mg/day (from oral medication) + 126.96 mg/day (from intrathecal fentanyl) = 186.96 MME/day (Aprox. 1.08 MME/day/kg).   Medications ordered for procedure: Meds ordered this encounter  Medications   morphine (MS CONTIN) 30 MG 12 hr tablet    Sig: Take 1 tablet (30 mg total) by mouth every 12 (twelve) hours. Must last 30 days. Do not break tablet    Dispense:  60 tablet    Refill:  0    DO NOT: delete (not duplicate); no partial-fill (will deny script to complete), no refill request (F/U required). DISPENSE: 1 day early if  closed on fill date. WARN: No CNS-depressants within 8  hrs of med.   morphine (MS CONTIN) 30 MG 12 hr tablet    Sig: Take 1 tablet (30 mg total) by mouth every 12 (twelve) hours. Must last 30 days. Do not break tablet    Dispense:  60 tablet    Refill:  0    DO NOT: delete (not duplicate); no partial-fill (will deny script to complete), no refill request (F/U required). DISPENSE: 1 day early if closed on fill date. WARN: No CNS-depressants within 8 hrs of med.    Medications administered: Jeneen Rinks B. [place]ir[place] had no medications administered during this visit.  See the medical record for exact dosing, route, and time of administration.  Follow-up plan:   Return for Pump Refill (Max:52mo.      Interventional Therapies  Risk  Complexity Considerations:   Estimated body mass index is 41.84 kg/m as calculated from the following:   Height as of this encounter: 5' 11" (1.803 m).   Weight as of this encounter: 300 lb (136.1 kg).    NOTE: NO RFA until BMI <35  Severe morbid obesity with BMI > 45  Obesity associated medical problems: Type 2 diabetes; obstructive sleep apnea; gastroesophageal reflux disease; congestive heart failure; osteoarthritis affecting multiple joints; knee, hip, and lumbar spine osteoarthritis.   Planned  Pending:   Palliative intrathecal pump refill and maintenance    Under consideration:   Diagnostic left Genicular NB  Diagnostic caudal ESI + epidurogram  Possible Racz procedure  Diagnostic bilateral IA shoulder joint injection  Diagnostic bilateral suprascapular NB    Completed:   Palliative intrathecal pump refill and management  Diagnostic bilateral lumbar facet block x1 (12/24/2017)    Therapeutic  Palliative (PRN) options:   Palliative/therapeutic intrathecal pump refill and maintenance  Diagnostic bilateral lumbar facet block #2     Recent Visits Date Type Provider Dept  01/22/21 Procedure visit NMilinda Pointer MD [facility]  Showing  recent visits within past 90 days and meeting all other requirements Today's Visits Date Type Provider Dept  03/19/21 Procedure visit NMilinda Pointer MD [facility]  Showing today's visits and meeting all other requirements Future Appointments No visits were found meeting these conditions. Showing future appointments within next 90 days and meeting all other requirements Disposition: Discharge home  Discharge (Date  Time): 03/19/2021; 1340 hrs.   Primary Care Physician: [facility]Antony Contras MD [facility] Note by: FGaspar Cola MD Date: 03/19/2021; Time: 1:38 PM  Disclaimer:  Medicine is not an eChief Strategy Officer The only guarantee in medicine is that nothing is guaranteed. It is important to note that the decision to proceed with this intervention was based on the information collected from the patient. The Data and conclusions were drawn from the patient's questionnaire, the interview, and the physical examination. Because the information was provided in large part by the patient, it cannot be guaranteed that it has not been purposely or unconsciously manipulated. Every effort has been made to obtain as much relevant data as possible for this evaluation. It is important to note that the conclusions that lead to this procedure are derived in large part from the available data. Always take into account that the treatment will also be dependent on availability of resources and existing treatment guidelines, considered by other Pain Management Practitioners as being common knowledge and practice, at the time of the intervention. For Medico-Legal purposes, it is also important to point out that variation in procedural techniques and pharmacological choices are the acceptable norm.  The indications, contraindications, technique, and results of the above procedure should only be interpreted and judged by a Board-Certified Interventional Pain Specialist with  extensive familiarity and expertise in the same exact procedure and technique.   

## 2021-03-19 NOTE — Patient Instructions (Signed)
Opioid Overdose Opioids are drugs that are often used to treat pain. Opioids include illegal drugs, such as heroin, as well as prescription pain medicines, such as codeine, morphine, hydrocodone, and fentanyl. An opioid overdose happens when you take too much of an opioid. An overdose may be intentional or accidental and can happen with any type of opioid. The effects of an overdose can be mild, dangerous, or even deadly. Opioid overdose is a medical emergency. What are the causes? This condition may be caused by: Taking too much of an opioid on purpose. Taking too much of an opioid by accident. Using two or more substances that contain opioids at the same time. Taking an opioid with a substance that affects your heart, breathing, or blood pressure. These include alcohol, tranquilizers, sleeping pills, illegal drugs, and some over-the-counter medicines. This condition may also happen due to an error made by: A health care provider who prescribes a medicine. The pharmacist who fills the prescription. What increases the risk? This condition is more likely in: Children. They may be attracted to colorful pills. Because of a child's small size, even a small amount of a medicine can be dangerous. Older people. They may be taking many different medicines. Older people may have difficulty reading labels or remembering when they last took their medicines. They may also be more sensitive to the effects of opioids. People with chronic medical conditions, especially heart, liver, kidney, or neurological diseases. People who take an opioid for a long period of time. People who take opioids and use illegal drugs, such as heroin, or other substances, such as alcohol. People who: Have a history of drug or alcohol abuse. Have certain mental health conditions. Have a history of previous drug overdoses. People who take opioids that are not prescribed for them. What are the signs or symptoms? Symptoms of this  condition depend on the type of opioid and the amount that was taken. Common symptoms include: Sleepiness or difficulty waking from sleep. Confusion. Slurred speech. Slowed breathing and a slow pulse (bradycardia). Nausea and vomiting. Abnormally small pupils. Signs and symptoms that require emergency treatment include: Cold, clammy, and pale skin. Blue lips and fingernails. Vomiting. Gurgling sounds in the throat. A pulse that is very slow or difficult to detect. Breathing that is very irregular, slow, noisy, or difficult to detect. Inability to respond to speech or be awakened from sleep (stupor). Seizures. How is this diagnosed? This condition is diagnosed based on your symptoms and medical history. It is important to tell your health care provider: About all of the opioids that you took. When you took the opioids. Whether you were drinking alcohol or using marijuana, cocaine, or other drugs. Your health care provider will do a physical exam. This exam may include: Checking and monitoring your heart rate and rhythm, breathing rate, temperature, and blood pressure. Measuring oxygen levels in your blood. Checking for abnormally small pupils. You may also have blood tests or urine tests. You may have X-rays if you are having severe breathing problems. How is this treated? This condition requires immediate medical treatment and hospitalization. Reversing the effects of the opioid is the first step in treatment. If you have a Narcan kit or naloxone, use it right away. Follow your health care provider's instructions. A friend or family member can also help you with this. The rest of your treatment will be given in the hospital intensive care (ICU). Treatment in the hospital may include: Giving salts and minerals (electrolytes) along with fluids through   an IV. ?Inserting a breathing tube (endotracheal tube) in your airway to help you breathe if you cannot breathe on your own or you are in  danger of not being able to breathe on your own. ?Giving oxygen through a small tube under your nose. ?Passing a tube through your nose and into your stomach (nasogastric tube, or NG tube) to empty your stomach. ?Giving medicines that: ?Increase your blood pressure. ?Relieve nausea and vomiting. ?Relieve abdominal pain and cramping. ?Reverse the effects of the opioid (naloxone). ?Monitoring your heart and oxygen levels. ?Ongoing counseling and mental health support if you intentionally overdosed or used an illegal drug. ?Follow these instructions at home: ?Medicines ?Take over-the-counter and prescription medicines only as told by your health care provider. ?Always ask your health care provider about possible side effects and interactions of any new medicine that you start taking. ?Keep a list of all the medicines that you take, including over-the-counter medicines. Bring this list with you to all your medical visits. ?General instructions ?Drink enough fluid to keep your urine pale yellow. ?Keep all follow-up visits. This is important. ?How is this prevented? ?Read the drug inserts that come with your opioid pain medicines. ?Take medicines only as told by your health care provider. Do not take more medicine than you are told. Do not take medicines more frequently than you are told. ?Do not drink alcohol or take sedatives when taking opioids. ?Do not use illegal or recreational drugs, including cocaine, ecstasy, and marijuana. ?Do not take opioid medicines that are not prescribed for you. ?Store all medicines in safety containers that are out of the reach of children. ?Get help if you are struggling with: ?Alcohol or drug use. ?Depression or another mental health problem. ?Thoughts of hurting yourself or another person. ?Keep the phone number of your local poison control center near your phone or in your mobile phone. In the [place] the hotline of the National Poison Control Center is (800) 222-1222. ?If you were  prescribed naloxone, make sure you understand how to take it. ?Contact a health care provider if: ?You need help understanding how to take your pain medicines. ?You feel your medicines are too strong. ?You are concerned that your pain medicines are not working well for your pain. ?You develop new symptoms or side effects when you are taking medicines. ?Get help right away if: ?You or someone else is having symptoms of an opioid overdose. Get help even if you are not sure. ?You have thoughts about hurting yourself or others. ?You have: ?Chest pain. ?Difficulty breathing. ?A loss of consciousness. ?These symptoms may represent a serious problem that is an emergency. Do not wait to see if the symptoms will go away. Get medical help right away. Call your local emergency services (911 in the [place] Do not drive yourself to the hospital. ?If you ever feel like you may hurt yourself or others, or have thoughts about taking your own life, get help right away. You can go to your nearest emergency department or: ?Call your local emergency services (911 in the [place] ?Call a suicide crisis helpline, such as the National Suicide Prevention Lifeline at 1-800-273-8255 or 988 in the [place] This is open 24 hours a day in the [place] ?Text the Crisis Text Line at 741741 (in the [place] ?Summary ?Opioids are drugs that are often used to treat pain. Opioids include illegal drugs, such as heroin, as well as prescription pain medicines. ?An opioid overdose happens when you take too much of an opioid. ?  Overdoses can be intentional or accidental. ?Opioid overdose is very dangerous. It is a life-threatening emergency. ?If you or someone you know is experiencing an opioid overdose, get help right away. ?This information is not intended to replace advice given to you by your health care provider. Make sure you discuss any questions you have with your health care provider. ?Document Revised: 10/24/2020 Document Reviewed: 07/11/2020 ?Elsevier  Patient Education ? 2022 [employer] ? ?

## 2021-03-19 NOTE — Progress Notes (Signed)
Nursing Pain Medication Assessment:  Safety precautions to be maintained throughout the outpatient stay will include: orient to surroundings, keep bed in low position, maintain call bell within reach at all times, provide assistance with transfer out of bed and ambulation.  Medication Inspection Compliance: Pill count conducted under aseptic conditions, in front of the patient. Neither the pills nor the bottle was removed from the patient's sight at any time. Once count was completed pills were immediately returned to the patient in their original bottle.  Medication: Morphine IR Pill/Patch Count:  0 of 60 pills remain Pill/Patch Appearance: Markings consistent with prescribed medication Bottle Appearance: Standard pharmacy container. Clearly labeled. Filled Date: 27 / 02 / 2022 Last Medication intake:  Ran out of medicine more than 48 hours ago

## 2021-03-20 ENCOUNTER — Telehealth: Payer: Self-pay

## 2021-03-20 NOTE — Telephone Encounter (Signed)
Post procedure phone call. Patient states he is doing well.  

## 2021-03-22 MED FILL — Medication: INTRATHECAL | Qty: 1 | Status: AC

## 2021-04-17 ENCOUNTER — Other Ambulatory Visit: Payer: Self-pay

## 2021-04-17 MED ORDER — PAIN MANAGEMENT IT PUMP REFILL: 1.0000 | Freq: Once | INTRATHECAL | 0 refills | Status: AC

## 2021-05-14 ENCOUNTER — Ambulatory Visit (HOSPITAL_BASED_OUTPATIENT_CLINIC_OR_DEPARTMENT_OTHER): Payer: No Typology Code available for payment source | Admitting: Pain Medicine

## 2021-05-14 ENCOUNTER — Other Ambulatory Visit: Payer: Self-pay

## 2021-05-14 ENCOUNTER — Encounter: Payer: Self-pay | Admitting: Pain Medicine

## 2021-05-14 ENCOUNTER — Other Ambulatory Visit
Admission: RE | Admit: 2021-05-14 | Discharge: 2021-05-14 | Disposition: A | Discharge status: Home / Self Care | Payer: No Typology Code available for payment source | Attending: Pain Medicine | Admitting: Pain Medicine

## 2021-05-14 VITALS — BP 138/84 | HR 78 | Temp 97.3°F | Resp 16 | Ht 71.0 in | Wt 332.0 lb

## 2021-05-14 DIAGNOSIS — M25551 Pain in right hip: Secondary | ICD-10-CM

## 2021-05-14 DIAGNOSIS — M961 Postlaminectomy syndrome, not elsewhere classified: Secondary | ICD-10-CM

## 2021-05-14 DIAGNOSIS — M153 Secondary multiple arthritis: Secondary | ICD-10-CM | POA: Insufficient documentation

## 2021-05-14 DIAGNOSIS — M79604 Pain in right leg: Secondary | ICD-10-CM | POA: Insufficient documentation

## 2021-05-14 DIAGNOSIS — M47816 Spondylosis without myelopathy or radiculopathy, lumbar region: Secondary | ICD-10-CM | POA: Insufficient documentation

## 2021-05-14 DIAGNOSIS — G96198 Other disorders of meninges, not elsewhere classified: Secondary | ICD-10-CM

## 2021-05-14 DIAGNOSIS — M5137 Other intervertebral disc degeneration, lumbosacral region: Secondary | ICD-10-CM | POA: Insufficient documentation

## 2021-05-14 DIAGNOSIS — M79605 Pain in left leg: Secondary | ICD-10-CM | POA: Insufficient documentation

## 2021-05-14 DIAGNOSIS — M4727 Other spondylosis with radiculopathy, lumbosacral region: Secondary | ICD-10-CM | POA: Insufficient documentation

## 2021-05-14 DIAGNOSIS — F1190 Opioid use, unspecified, uncomplicated: Secondary | ICD-10-CM

## 2021-05-14 DIAGNOSIS — Z79899 Other long term (current) drug therapy: Secondary | ICD-10-CM | POA: Insufficient documentation

## 2021-05-14 DIAGNOSIS — G8929 Other chronic pain: Secondary | ICD-10-CM | POA: Insufficient documentation

## 2021-05-14 DIAGNOSIS — Z978 Presence of other specified devices: Secondary | ICD-10-CM

## 2021-05-14 DIAGNOSIS — R29898 Other symptoms and signs involving the musculoskeletal system: Secondary | ICD-10-CM

## 2021-05-14 DIAGNOSIS — G894 Chronic pain syndrome: Secondary | ICD-10-CM

## 2021-05-14 DIAGNOSIS — M25562 Pain in left knee: Secondary | ICD-10-CM | POA: Insufficient documentation

## 2021-05-14 DIAGNOSIS — Z451 Encounter for adjustment and management of infusion pump: Secondary | ICD-10-CM

## 2021-05-14 DIAGNOSIS — Z96652 Presence of left artificial knee joint: Secondary | ICD-10-CM | POA: Insufficient documentation

## 2021-05-14 DIAGNOSIS — Z79891 Long term (current) use of opiate analgesic: Secondary | ICD-10-CM | POA: Insufficient documentation

## 2021-05-14 DIAGNOSIS — M5410 Radiculopathy, site unspecified: Secondary | ICD-10-CM | POA: Insufficient documentation

## 2021-05-14 DIAGNOSIS — M545 Low back pain, unspecified: Secondary | ICD-10-CM | POA: Insufficient documentation

## 2021-05-14 DIAGNOSIS — Z6841 Body Mass Index (BMI) 40.0 and over, adult: Secondary | ICD-10-CM | POA: Insufficient documentation

## 2021-05-14 MED ORDER — KETOROLAC TROMETHAMINE 60 MG/2ML IM SOLN
60.0000 mg | Freq: Once | INTRAMUSCULAR | Status: AC
  Administered 2021-05-14: 60 mg via INTRAMUSCULAR
  Filled 2021-05-14: qty 2

## 2021-05-14 MED ORDER — ORPHENADRINE CITRATE 30 MG/ML IJ SOLN
60.0000 mg | Freq: Once | INTRAMUSCULAR | Status: AC
  Administered 2021-05-14: 60 mg via INTRAMUSCULAR
  Filled 2021-05-14: qty 2

## 2021-05-14 NOTE — Patient Instructions (Signed)
Opioid Overdose ?Opioids are drugs that are often used to treat pain. Opioids include illegal drugs, such as heroin, as well as prescription pain medicines, such as codeine, morphine, hydrocodone, and fentanyl. ?An opioid overdose happens when you take too much of an opioid. An overdose may be intentional or accidental and can happen with any type of opioid. ?The effects of an overdose can be mild, dangerous, or even deadly. Opioid overdose is a medical emergency. ?What are the causes? ?This condition may be caused by: ?Taking too much of an opioid on purpose. ?Taking too much of an opioid by accident. ?Using two or more substances that contain opioids at the same time. ?Taking an opioid with a substance that affects your heart, breathing, or blood pressure. These include alcohol, tranquilizers, sleeping pills, illegal drugs, and some over-the-counter medicines. ?This condition may also happen due to an error made by: ?A health care provider who prescribes a medicine. ?The pharmacist who fills the prescription. ?What increases the risk? ?This condition is more likely in: ?Children. They may be attracted to colorful pills. Because of a child's small size, even a small amount of a medicine can be dangerous. ?Older people. They may be taking many different medicines. Older people may have difficulty reading labels or remembering when they last took their medicines. They may also be more sensitive to the effects of opioids. ?People with chronic medical conditions, especially heart, liver, kidney, or neurological diseases. ?People who take an opioid for a long period of time. ?People who take opioids and use illegal drugs, such as heroin, or other substances, such as alcohol. ?People who: ?Have a history of drug or alcohol abuse. ?Have certain mental health conditions. ?Have a history of previous drug overdoses. ?People who take opioids that are not prescribed for them. ?What are the signs or symptoms? ?Symptoms of this  condition depend on the type of opioid and the amount that was taken. Common symptoms include: ?Sleepiness or difficulty waking from sleep. ?Confusion. ?Slurred speech. ?Slowed breathing and a slow pulse (bradycardia). ?Nausea and vomiting. ?Abnormally small pupils. ?Signs and symptoms that require emergency treatment include: ?Cold, clammy, and pale skin. ?Blue lips and fingernails. ?Vomiting. ?Gurgling sounds in the throat. ?A pulse that is very slow or difficult to detect. ?Breathing that is very irregular, slow, noisy, or difficult to detect. ?Inability to respond to speech or be awakened from sleep (stupor). ?Seizures. ?How is this diagnosed? ?This condition is diagnosed based on your symptoms and medical history. It is important to tell your health care provider: ?About all of the opioids that you took. ?When you took the opioids. ?Whether you were drinking alcohol or using marijuana, cocaine, or other drugs. ?Your health care provider will do a physical exam. This exam may include: ?Checking and monitoring your heart rate and rhythm, breathing rate, temperature, and blood pressure. ?Measuring oxygen levels in your blood. ?Checking for abnormally small pupils. ?You may also have blood tests or urine tests. You may have X-rays if you are having severe breathing problems. ?How is this treated? ?This condition requires immediate medical treatment and hospitalization. Reversing the effects of the opioid is the first step in treatment. If you have a Narcan kit or naloxone, use it right away. Follow your health care provider's instructions. A friend or family member can also help you with this. ?The rest of your treatment will be given in the hospital intensive care (ICU). Treatment in the hospital may include: ?Giving salts and minerals (electrolytes) along with fluids through   an IV. ?Inserting a breathing tube (endotracheal tube) in your airway to help you breathe if you cannot breathe on your own or you are in  danger of not being able to breathe on your own. ?Giving oxygen through a small tube under your nose. ?Passing a tube through your nose and into your stomach (nasogastric tube, or NG tube) to empty your stomach. ?Giving medicines that: ?Increase your blood pressure. ?Relieve nausea and vomiting. ?Relieve abdominal pain and cramping. ?Reverse the effects of the opioid (naloxone). ?Monitoring your heart and oxygen levels. ?Ongoing counseling and mental health support if you intentionally overdosed or used an illegal drug. ?Follow these instructions at home: ?Medicines ?Take over-the-counter and prescription medicines only as told by your health care provider. ?Always ask your health care provider about possible side effects and interactions of any new medicine that you start taking. ?Keep a list of all the medicines that you take, including over-the-counter medicines. Bring this list with you to all your medical visits. ?General instructions ?Drink enough fluid to keep your urine pale yellow. ?Keep all follow-up visits. This is important. ?How is this prevented? ?Read the drug inserts that come with your opioid pain medicines. ?Take medicines only as told by your health care provider. Do not take more medicine than you are told. Do not take medicines more frequently than you are told. ?Do not drink alcohol or take sedatives when taking opioids. ?Do not use illegal or recreational drugs, including cocaine, ecstasy, and marijuana. ?Do not take opioid medicines that are not prescribed for you. ?Store all medicines in safety containers that are out of the reach of children. ?Get help if you are struggling with: ?Alcohol or drug use. ?Depression or another mental health problem. ?Thoughts of hurting yourself or another person. ?Keep the phone number of your local poison control center near your phone or in your mobile phone. In the [place] the hotline of the National Poison Control Center is (800) 222-1222. ?If you were  prescribed naloxone, make sure you understand how to take it. ?Contact a health care provider if: ?You need help understanding how to take your pain medicines. ?You feel your medicines are too strong. ?You are concerned that your pain medicines are not working well for your pain. ?You develop new symptoms or side effects when you are taking medicines. ?Get help right away if: ?You or someone else is having symptoms of an opioid overdose. Get help even if you are not sure. ?You have thoughts about hurting yourself or others. ?You have: ?Chest pain. ?Difficulty breathing. ?A loss of consciousness. ?These symptoms may represent a serious problem that is an emergency. Do not wait to see if the symptoms will go away. Get medical help right away. Call your local emergency services (911 in the [place] Do not drive yourself to the hospital. ?If you ever feel like you may hurt yourself or others, or have thoughts about taking your own life, get help right away. You can go to your nearest emergency department or: ?Call your local emergency services (911 in the [place] ?Call a suicide crisis helpline, such as the National Suicide Prevention Lifeline at 1-800-273-8255 or 988 in the [place] This is open 24 hours a day in the [place] ?Text the Crisis Text Line at 741741 (in the [place] ?Summary ?Opioids are drugs that are often used to treat pain. Opioids include illegal drugs, such as heroin, as well as prescription pain medicines. ?An opioid overdose happens when you take too much of an opioid. ?  Overdoses can be intentional or accidental. ?Opioid overdose is very dangerous. It is a life-threatening emergency. ?If you or someone you know is experiencing an opioid overdose, get help right away. ?This information is not intended to replace advice given to you by your health care provider. Make sure you discuss any questions you have with your health care provider. ?Document Revised: 10/24/2020 Document Reviewed: 07/11/2020 ?[employer]vier  Patient Education ? 2022 [employer] ? ?

## 2021-05-14 NOTE — Progress Notes (Signed)
Safety precautions to be maintained throughout the outpatient stay will include: orient to surroundings, keep bed in low position, maintain call bell within reach at all times, provide assistance with transfer out of bed and ambulation.  

## 2021-05-14 NOTE — Progress Notes (Signed)
PROVIDER NOTE: Interpretation of information contained herein should be left to medically-trained personnel. Specific patient instructions are provided elsewhere under "Patient Instructions" section of medical record. This document was created in part using STT-dictation technology, any transcriptional errors that may result from this process are unintentional.  Patient: [name] Type: Established DOB: 01/04/1966 MRN: [id number] PCP: Antony Contras, MD  Service: Procedure DOS: 05/14/2021 Setting: Ambulatory Location: Ambulatory outpatient facility Delivery: Face-to-face Provider: Gaspar Cola, MD Specialty: Interventional Pain Management Specialty designation: 09 Location: Outpatient facility Ref. Prov.: Antony Contras, MD    Primary Reason for Visit: Interventional Pain Management Treatment. CC: Back Pain (Lumbar bilateral +)  Procedure:          Type: Management of Intrathecal Drug Delivery System (IDDS) - Reservoir Refill [phone]). No rate change.  Indications: 1. Chronic pain syndrome   2. Chronic low back pain (1ry area of Pain) (Bilateral) (R>L) w/o sciatica   3. Chronic lower extremity pain (2ry area of Pain) (Bilateral) (L>R)   4. Chronic lower extremity radicular pain   5. DDD (degenerative disc disease), lumbosacral   6. Failed back surgical syndrome   7. Epidural fibrosis   8. Chronic hip pain (Right)   9. Chronic knee pain after total replacement (Left)   10. Lumbar facet syndrome (Bilateral) (R>L)   11. Lumbosacral spondylosis with radiculopathy   12. Weakness of back   13. Weakness of lower extremities (Bilateral)   14. Secondary osteoarthritis of multiple sites   15. Presence of intrathecal pump (Medtronics)   16. Physical tolerance to opiate drug   17. Pharmacologic therapy   18. Chronic use of opiate for therapeutic purpose   19. Encounter for chronic pain management   20. Encounter for medication management   21. Class 3 severe obesity due to excess  calories with serious comorbidity and body mass index (BMI) of 50.0 to 59.9 in adult ([place])   22. Encounter for adjustment or management of infusion pump    Pain Assessment: Self-Reported Pain Score: 9 /10             Reported level is compatible with observation.         Intrathecal Drug Delivery System (IDDS)  Pump Device:  Manufacturer: Medtronic Model: Synchromed II Model No.: [id number] Serial No.: [id number] H Delivery Route: Intrathecal Type: Programmable  Volume (mL): 40 mL reservoir Priming Volume: n/a  Calibration Constant: 117.0  MRI compatibility: Conditional   Implant Details:  Date: 2009 Implanter: Glenna Fellows, MD (implant done at [facility]) Contact Information: n/a Last Revision/Replacement: n/a Estimated Replacement Date: Apr/2023  Implant Site: Abdominal Laterality: Left  Catheter: Manufacturer: Medtronic Model:  n/a  Model No.: n/a  Serial No.: n/a  Implanted Length (cm): n/a  Catheter Volume (mL): 0.145  Tip Location (Level): T7 Canal Access Site: L1-2 (R)  Drug content:  Primary Medication Class: Opioid  Medication: PF-Fentanyl  Concentration: 2,000.0 mcg/mL   Secondary Medication Class: Local Anesthetic  Medication: PF-Bupivacaine  Concentration: 30 mg/mL   Tertiary Medication Class: Antispasmodic  Medication: PF-Baclofen  Concentration: 200 mcg/mL   Fourth Medication Class: none   PA parameters (PCA-mode):  Mode: Off (Inactive)  Programming:  Type: Simple continuous.  Medication, Concentration, Infusion Program, & Delivery Rate: For up-to-date details please see most recent scanned programming printout.    Changes:  Medication Change: None at this point Rate Change: No change in rate  Reported side-effects or adverse reactions: None reported  Effectiveness: Described as relatively effective, allowing for increase in activities  of daily living (ADL) Clinically meaningful improvement in function (CMIF): Sustained CMIF goals  met  Plan: Pump refill today   Pharmacotherapy Assessment   Opioid Analgesic: Oxycodone IR 10 mg, 1 tab PO q 6 hrs (40 mg/day of oxycodone) + 52.9 mcg/hr intrathecal PF-Fentanyl (1,268.8 mcg/day of Fentanyl) MME/day: 60 mg/day (from oral medication) + 126.96 mg/day (from intrathecal fentanyl) = 186.96 MME/day (Aprox. 1.08 MME/day/kg).   Monitoring: [place] PMP: PDMP not reviewed this encounter.       Pharmacotherapy: No side-effects or adverse reactions reported. Compliance: No problems identified. Effectiveness: Clinically acceptable. Plan: Refer to "POC". UDS:  Summary  Date Value Ref Range Status  09/20/2020 Note  Final    Comment:    ==================================================================== [employer] Select 13 (MW) ==================================================================== Test                             Result       Flag       Units  Drug Present and Declared for Prescription Verification   Morphine                       3885         EXPECTED   ng/mg creat   Normorphine                    68           EXPECTED   ng/mg creat    Potential sources of large amounts of morphine in the absence of    codeine include administration of morphine or use of heroin.     Normorphine is an expected metabolite of morphine.    Fentanyl                       29           EXPECTED   ng/mg creat   Norfentanyl                    185          EXPECTED   ng/mg creat    Source of fentanyl is a scheduled prescription medication, including    IV, patch, and transmucosal formulations. Norfentanyl is an expected    metabolite of fentanyl.  Drug Present not Declared for Prescription Verification   Hydromorphone                  212          UNEXPECTED ng/mg creat    Hydromorphone may be administered as a scheduled prescription    medication and is also an expected metabolite of hydrocodone.    Hydromorphone is also a minor metabolite of morphine which is also    present in this  specimen. Concentrations of hydromorphone rarely    exceed 5% of the morphine concentration when metabolism of morphine    is the sole source of hydromorphone.  ==================================================================== Test                      Result    Flag   Units      Ref Range   Creatinine              188              mg/dL      >=20 ==================================================================== Declared Medications:  The flagging and  interpretation on this report are based on the  following declared medications.  Unexpected results may arise from  inaccuracies in the declared medications.   **Note: The testing scope of this panel includes these medications:   Fentanyl  Morphine   **Note: The testing scope of this panel does not include the  following reported medications:   Acetaminophen  Aspirin  Baclofen  Bupivacaine  Calcium  Cholecalciferol  Cyclobenzaprine  Escitalopram  Esomeprazole  Gabapentin  Ibuprofen  Metolazone  Naloxone  Potassium Chloride  Pregabalin  Promethazine  Rosuvastatin  Tizanidine  Torsemide ==================================================================== For clinical consultation, please call [phone]. ====================================================================      Pre-op H&P Assessment:  Mr. Whiteley is a 56 y.o. (year old), male patient, seen today for interventional treatment. He  has a past surgical history that includes Back surgery; morphine pump; Tonsillectomy; Joint replacement; Shoulder arthroscopy with subacromial decompression, rotator cuff repair and bicep tendon repair (Right, 08/18/2013); Shoulder arthroscopy with subacromial decompression and bicep tendon repair (Left, 10/06/2013); Laparoscopic gastric sleeve resection (N/A, 01/08/2015); and RIGHT HEART CATH (N/A, 09/22/2019). Mr. Hopfensperger has a current medication list which includes the following prescription(s): acetaminophen, aspirin ec,  calcium carbonate, vitamin d3, cyclobenzaprine, escitalopram, esomeprazole, gabapentin, morphine, [START ON 05/31/2021] morphine, naloxone, NON FORMULARY, potassium chloride, promethazine, rosuvastatin, tizanidine, torsemide, ibuprofen, metolazone, morphine, and pregabalin, and the following Facility-Administered Medications: ketorolac and orphenadrine. His primarily concern today is the Back Pain (Lumbar bilateral +)  Initial Vital Signs:  Pulse/HCG Rate: 78  Temp: (!) 97.3 F (36.3 C) Resp: 16 BP: 138/84 SpO2: 100 %  BMI: Estimated body mass index is 46.3 kg/m as calculated from the following:   Height as of this encounter: '5\' 11"'  (1.803 m).   Weight as of this encounter: 332 lb (150.6 kg).  Risk Assessment: Allergies: Reviewed. He is allergic to tape.  Allergy Precautions: None required Coagulopathies: Reviewed. None identified.  Blood-thinner therapy: None at this time Active Infection(s): Reviewed. None identified. Mr. Loflin is afebrile  Site Confirmation: Mr. Awan was asked to confirm the procedure and laterality before marking the site Procedure checklist: Completed Consent: Before the procedure and under the influence of no sedative(s), amnesic(s), or anxiolytics, the patient was informed of the treatment options, risks and possible complications. To fulfill our ethical and legal obligations, as recommended by the American Medical Association's Code of Ethics, I have informed the patient of my clinical impression; the nature and purpose of the treatment or procedure; the risks, benefits, and possible complications of the intervention; the alternatives, including doing nothing; the risk(s) and benefit(s) of the alternative treatment(s) or procedure(s); and the risk(s) and benefit(s) of doing nothing.  Mr. Inclan was provided with information about the general risks and possible complications associated with most interventional procedures. These include, but are not limited to:  failure to achieve desired goals, infection, bleeding, organ or nerve damage, allergic reactions, paralysis, and/or death.  In addition, he was informed of those risks and possible complications associated to this particular procedure, which include, but are not limited to: damage to the implant; failure to decrease pain; local, systemic, or serious CNS infections, intraspinal abscess with possible cord compression and paralysis, or life-threatening such as meningitis; bleeding; organ damage; nerve injury or damage with subsequent sensory, motor, and/or autonomic system dysfunction, resulting in transient or permanent pain, numbness, and/or weakness of one or several areas of the body; allergic reactions, either minor or major life-threatening, such as anaphylactic or anaphylactoid reactions.  Furthermore, Mr. Hosley was informed of those risks  and complications associated with the medications. These include, but are not limited to: allergic reactions (i.e.: anaphylactic or anaphylactoid reactions); endorphine suppression; bradycardia and/or hypotension; water retention and/or peripheral vascular relaxation leading to lower extremity edema and possible stasis ulcers; respiratory depression and/or shortness of breath; decreased metabolic rate leading to weight gain; swelling or edema; medication-induced neural toxicity; particulate matter embolism and blood vessel occlusion with resultant organ, and/or nervous system infarction; and/or intrathecal granuloma formation with possible spinal cord compression and permanent paralysis.  Before refilling the pump Mr. Nordin was informed that some of the medications used in the devise may not be FDA approved for such use and therefore it constitutes an off-label use of the medications.  Finally, he was informed that Medicine is not an exact science; therefore, there is also the possibility of unforeseen or unpredictable risks and/or possible complications that may  result in a catastrophic outcome. The patient indicated having understood very clearly. We have given the patient no guarantees and we have made no promises. Enough time was given to the patient to ask questions, all of which were answered to the patient's satisfaction. Mr. Chisolm has indicated that he wanted to continue with the procedure. Attestation: I, the ordering provider, attest that I have discussed with the patient the benefits, risks, side-effects, alternatives, likelihood of achieving goals, and potential problems during recovery for the procedure that I have provided informed consent. Date   Time: 05/14/2021  1:13 PM  Pre-Procedure Preparation:  Monitoring: As per clinic protocol. Respiration, ETCO2, SpO2, BP, heart rate and rhythm monitor placed and checked for adequate function Safety Precautions: Patient was assessed for positional comfort and pressure points before starting the procedure. Time-out: I initiated and conducted the "Time-out" before starting the procedure, as per protocol. The patient was asked to participate by confirming the accuracy of the "Time Out" information. Verification of the correct person, site, and procedure were performed and confirmed by me, the nursing staff, and the patient. "Time-out" conducted as per Joint Commission's Universal Protocol (UP.01.01.01). Time:    Description of Procedure:          Position: Supine Target Area: Central-port of intrathecal pump. Approach: Anterior, 90 degree angle approach. Area Prepped: Entire Area around the pump implant. DuraPrep (Iodine Povacrylex [0.7% available iodine] and Isopropyl Alcohol, 74% w/w) Safety Precautions: Aspiration looking for blood return was conducted prior to all injections. At no point did we inject any substances, as a needle was being advanced. No attempts were made at seeking any paresthesias. Safe injection practices and needle disposal techniques used. Medications properly checked for  expiration dates. SDV (single dose vial) medications used. Description of the Procedure: Protocol guidelines were followed. Two nurses trained to do implant refills were present during the entire procedure. The refill medication was checked by both healthcare providers as well as the patient. The patient was included in the "Time-out" to verify the medication. The patient was placed in position. The pump was identified. The area was prepped in the usual manner. The sterile template was positioned over the pump, making sure the side-port location matched that of the pump. Both, the pump and the template were held for stability. The needle provided in the Medtronic Kit was then introduced thru the center of the template and into the central port. The pump content was aspirated and discarded volume documented. The new medication was slowly infused into the pump, thru the filter, making sure to avoid overpressure of the device. The needle was then removed and  the area cleansed, making sure to leave some of the prepping solution back to take advantage of its long term bactericidal properties. The pump was interrogated and programmed to reflect the correct medication, volume, and dosage. The program was printed and taken to the physician for approval. Once checked and signed by the physician, a copy was provided to the patient and another scanned into the EMR.  Vitals:   05/14/21 1310  BP: 138/84  Pulse: 78  Resp: 16  Temp: (!) 97.3 F (36.3 C)  TempSrc: Temporal  SpO2: 100%  Weight: (!) 332 lb (150.6 kg)  Height: '5\' 11"'  (1.803 m)    Start Time:   hrs. End Time:   hrs. Materials & Medications: Medtronic Refill Kit Medication(s): Please see chart orders for details.  Imaging Guidance:          Type of Imaging Technique: None used Indication(s): N/A Exposure Time: No patient exposure Contrast: None used. Fluoroscopic Guidance: N/A Ultrasound Guidance: N/A Interpretation: N/A  Antibiotic  Prophylaxis:   Anti-infectives (From admission, onward)    None      Indication(s): None identified  Post-operative Assessment:  Post-procedure Vital Signs:  Pulse/HCG Rate: 78  Temp: (!) 97.3 F (36.3 C) Resp: 16 BP: 138/84 SpO2: 100 %  EBL: None  Complications: No immediate post-treatment complications observed by team, or reported by patient.  Note: The patient tolerated the entire procedure well. A repeat set of vitals were taken after the procedure and the patient was kept under observation following institutional policy, for this type of procedure. Post-procedural neurological assessment was performed, showing return to baseline, prior to discharge. The patient was provided with post-procedure discharge instructions, including a section on how to identify potential problems. Should any problems arise concerning this procedure, the patient was given instructions to immediately contact us, at any time, without hesitation. In any case, we plan to contact the patient by telephone for a follow-up status report regarding this interventional procedure.  Comments:  No additional relevant information.  Plan of Care  Orders:  Orders Placed This Encounter  Procedures   PUMP REFILL    Maintain Protocol by having two(2) healthcare providers during procedure and programming.    Scheduling Instructions:     Please refill intrathecal pump today.    Order Specific Question:   Where will this procedure be performed?    Answer:   [facility]   PUMP REFILL    Whenever possible schedule on a procedure today.    Standing Status:   Future    Standing Expiration Date:   09/11/2021    Scheduling Instructions:     Please schedule intrathecal pump refill based on pump programming. Avoid schedule intervals of more than 120 days (4 months).    Order Specific Question:   Where will this procedure be performed?    Answer:   [facility]   Cytochrome P450 3A4/3A5    Cc PCP:  Antony Contras, MD    Order Specific Question:   CC Results    Answer:   [id number] [[id number]]    Order Specific Question:   Release to patient    Answer:   Immediate   Cytochrome P450 2D6/2C19    Cc PCP: Antony Contras, MD    Order Specific Question:   CC Results    Answer:   [id number] [[id number]]    Order Specific Question:   Release to patient    Answer:   Immediate   Informed Consent Details: Physician/Practitioner Attestation;  Transcribe to consent form and obtain patient signature    Transcribe to consent form and obtain patient signature.    Order Specific Question:   Physician/Practitioner attestation of informed consent for procedure/surgical case    Answer:   I, the physician/practitioner, attest that I have discussed with the patient the benefits, risks, side effects, alternatives, likelihood of achieving goals and potential problems during recovery for the procedure that I have provided informed consent.    Order Specific Question:   Procedure    Answer:   Intrathecal pump refill    Order Specific Question:   Physician/Practitioner performing the procedure    Answer:   Attending Physician: Kathlen Brunswick. Dossie Arbour, MD & designated trained staff    Order Specific Question:   Indication/Reason    Answer:   Chronic Pain Syndrome (G89.4), presence of an intrathecal pump (Z97.8)   Chronic Opioid Analgesic:  Oxycodone IR 10 mg, 1 tab PO q 6 hrs (40 mg/day of oxycodone) + 52.9 mcg/hr intrathecal PF-Fentanyl (1,268.8 mcg/day of Fentanyl) MME/day: 60 mg/day (from oral medication) + 126.96 mg/day (from intrathecal fentanyl) = 186.96 MME/day (Aprox. 1.08 MME/day/kg).   Medications ordered for procedure: Meds ordered this encounter  Medications   ketorolac (TORADOL) injection 60 mg   orphenadrine (NORFLEX) injection 60 mg   Medications administered: Jeneen Rinks B. Paget had no medications administered during this visit.  See the medical record for exact dosing, route, and time of  administration.  Follow-up plan:   Return for Pump Refill (Max:67mo.       Interventional Therapies  Risk   Complexity Considerations:   Estimated body mass index is 41.84 kg/m as calculated from the following:   Height as of this encounter: '5\' 11"'  (1.803 m).   Weight as of this encounter: 300 lb (136.1 kg).    NOTE: NO RFA until BMI <35  Severe morbid obesity with BMI > 45  Obesity associated medical problems: Type 2 diabetes; obstructive sleep apnea; gastroesophageal reflux disease; congestive heart failure; osteoarthritis affecting multiple joints; knee, hip, and lumbar spine osteoarthritis.   Planned   Pending:   Palliative intrathecal pump refill and maintenance    Under consideration:   Diagnostic left Genicular NB  Diagnostic caudal ESI + epidurogram  Possible Racz procedure  Diagnostic bilateral IA shoulder joint injection  Diagnostic bilateral suprascapular NB    Completed:   Palliative intrathecal pump refill and management  Diagnostic bilateral lumbar facet block x1 (12/24/2017)    Therapeutic   Palliative (PRN) options:   Palliative/therapeutic intrathecal pump refill and maintenance  Diagnostic bilateral lumbar facet block #2     Recent Visits Date Type Provider Dept  03/19/21 Procedure visit NMilinda Pointer MD [facility]  Showing recent visits within past 90 days and meeting all other requirements Today's Visits Date Type Provider Dept  05/14/21 Procedure visit NMilinda Pointer MD [facility]  Showing today's visits and meeting all other requirements Future Appointments No visits were found meeting these conditions. Showing future appointments within next 90 days and meeting all other requirements  Disposition: Discharge home  Discharge (Date   Time): 05/14/2021;   hrs.   Primary Care Physician: [facility] Note by: FGaspar Cola MD Date: 05/14/2021; Time: 1:23  PM  Disclaimer:  Medicine is not an eChief Strategy Officer The only guarantee in medicine is that nothing is guaranteed. It is important to note that the decision to proceed with this intervention was based on the information collected  from the patient. The Data and conclusions were drawn from the patient's questionnaire, the interview, and the physical examination. Because the information was provided in large part by the patient, it cannot be guaranteed that it has not been purposely or unconsciously manipulated. Every effort has been made to obtain as much relevant data as possible for this evaluation. It is important to note that the conclusions that lead to this procedure are derived in large part from the available data. Always take into account that the treatment will also be dependent on availability of resources and existing treatment guidelines, considered by other Pain Management Practitioners as being common knowledge and practice, at the time of the intervention. For Medico-Legal purposes, it is also important to point out that variation in procedural techniques and pharmacological choices are the acceptable norm. The indications, contraindications, technique, and results of the above procedure should only be interpreted and judged by a Board-Certified Interventional Pain Specialist with extensive familiarity and expertise in the same exact procedure and technique.

## 2021-05-15 ENCOUNTER — Telehealth: Payer: Self-pay | Admitting: Pain Medicine

## 2021-05-15 ENCOUNTER — Telehealth: Payer: Self-pay

## 2021-05-15 NOTE — Telephone Encounter (Signed)
Bubble message sent to Dr. Naveira. 

## 2021-05-15 NOTE — Telephone Encounter (Signed)
Post pump refill. Patient states he is doing good.

## 2021-05-15 NOTE — Telephone Encounter (Signed)
Per Dr. [name], no, all non-opioid medications will be prescribed by PCP. Patient notified per voicemail.

## 2021-05-15 NOTE — Telephone Encounter (Signed)
Patient left vmail asking if Dr. [name] will send in scripts for D-3 and B-12 so his insurance will pay for it. Please notify patient

## 2021-05-21 MED FILL — Medication: INTRATHECAL | Qty: 1 | Status: AC

## 2021-05-30 LAB — CYTOCHROME P450 2D6 GENOTYPING: 2D6 Metabolic Activity:: NORMAL

## 2021-06-06 DIAGNOSIS — G894 Chronic pain syndrome: Secondary | ICD-10-CM | POA: Diagnosis not present

## 2021-06-06 DIAGNOSIS — E7800 Pure hypercholesterolemia, unspecified: Secondary | ICD-10-CM | POA: Diagnosis not present

## 2021-06-06 DIAGNOSIS — J9611 Chronic respiratory failure with hypoxia: Secondary | ICD-10-CM | POA: Diagnosis not present

## 2021-06-06 DIAGNOSIS — G4730 Sleep apnea, unspecified: Secondary | ICD-10-CM | POA: Diagnosis not present

## 2021-06-06 DIAGNOSIS — R5381 Other malaise: Secondary | ICD-10-CM | POA: Diagnosis not present

## 2021-06-06 DIAGNOSIS — R7303 Prediabetes: Secondary | ICD-10-CM | POA: Diagnosis not present

## 2021-06-06 DIAGNOSIS — I5032 Chronic diastolic (congestive) heart failure: Secondary | ICD-10-CM | POA: Diagnosis not present

## 2021-06-06 DIAGNOSIS — F321 Major depressive disorder, single episode, moderate: Secondary | ICD-10-CM | POA: Diagnosis not present

## 2021-06-06 DIAGNOSIS — Z8719 Personal history of other diseases of the digestive system: Secondary | ICD-10-CM | POA: Diagnosis not present

## 2021-06-17 ENCOUNTER — Other Ambulatory Visit: Payer: Self-pay | Admitting: Pain Medicine

## 2021-06-17 ENCOUNTER — Telehealth: Payer: Self-pay | Admitting: Pain Medicine

## 2021-06-17 DIAGNOSIS — M5137 Other intervertebral disc degeneration, lumbosacral region: Secondary | ICD-10-CM

## 2021-06-17 DIAGNOSIS — G8929 Other chronic pain: Secondary | ICD-10-CM

## 2021-06-17 DIAGNOSIS — R29898 Other symptoms and signs involving the musculoskeletal system: Secondary | ICD-10-CM

## 2021-06-17 DIAGNOSIS — M79604 Pain in right leg: Secondary | ICD-10-CM

## 2021-06-17 DIAGNOSIS — M47816 Spondylosis without myelopathy or radiculopathy, lumbar region: Secondary | ICD-10-CM

## 2021-06-17 DIAGNOSIS — M5410 Radiculopathy, site unspecified: Secondary | ICD-10-CM

## 2021-06-17 DIAGNOSIS — M961 Postlaminectomy syndrome, not elsewhere classified: Secondary | ICD-10-CM

## 2021-06-17 DIAGNOSIS — M4727 Other spondylosis with radiculopathy, lumbosacral region: Secondary | ICD-10-CM

## 2021-06-17 NOTE — Telephone Encounter (Signed)
Note placed on Dr. Naveira's desk. ?

## 2021-06-17 NOTE — Telephone Encounter (Signed)
done

## 2021-06-17 NOTE — Telephone Encounter (Signed)
Caseworker Rubie Maid lvmail this AM asking for new orders for MRI and EMG/PNCV, she had problems getting approved thru workers' comp. Now has approval but orders are expired. Please talk to Dr. Dossie Arbour and advise.  ?

## 2021-07-01 ENCOUNTER — Other Ambulatory Visit: Payer: Self-pay

## 2021-07-01 MED ORDER — PAIN MANAGEMENT IT PUMP REFILL: 1.0000 | Freq: Once | INTRATHECAL | 0 refills | Status: AC

## 2021-07-04 DIAGNOSIS — G894 Chronic pain syndrome: Secondary | ICD-10-CM | POA: Diagnosis not present

## 2021-07-04 DIAGNOSIS — F321 Major depressive disorder, single episode, moderate: Secondary | ICD-10-CM | POA: Diagnosis not present

## 2021-07-04 DIAGNOSIS — I5032 Chronic diastolic (congestive) heart failure: Secondary | ICD-10-CM | POA: Diagnosis not present

## 2021-07-10 ENCOUNTER — Telehealth: Payer: Self-pay | Admitting: Internal Medicine

## 2021-07-10 NOTE — Telephone Encounter (Signed)
Patient has not been seen in person since 2021.  It would be preferable to have him come for in person visit.  Thank you. ? ?Left a message for the pt to call back.  ?

## 2021-07-10 NOTE — Telephone Encounter (Signed)
?*  STAT* If patient is at the pharmacy, call can be transferred to refill team. ? ? ?1. Which medications need to be refilled? (please list name of each medication and dose if known)  ?Furosemide ? ?2. Which pharmacy/location (including street and city if local pharmacy) is medication to be sent to? ?[facility] - [address] [place], [place] [address] ? ?3. Do they need a 30 day or 90 day supply?  ?90 day supply ? ?

## 2021-07-10 NOTE — Telephone Encounter (Signed)
Patient states his legs no longer work and he is requesting to have 05/01 appointment with Bernadene Person, NP converted to a virtual if possible. Please advise. ?

## 2021-07-10 NOTE — Telephone Encounter (Signed)
I spoke with the pt and he reports that his "legs re not working" and he just got a Corporate investment banker wheelchair... he says it is not a matter of transportation because his wife can bring him but it is just a "hassle" with his circumstances of limited mobility.  ? ?I advised him that it would be optimal to see him in the office.. he may need EKG and labs which have not been done in a while... he says he agrees but still prefers not to have to leave his house... I asked him to reach back out to [place] as his appt gets closer which is not until 08/12/21 and maybe by then he will feel better about coming in.....Marland Kitchen I will also forward to Bernadene Person NP per his request to review and for her recommendations.  ?

## 2021-07-11 NOTE — Telephone Encounter (Signed)
Detailed message left  informing pt that needs in person office visit Pt has in person visit appt  on 5/1/cy ?

## 2021-07-12 NOTE — Telephone Encounter (Signed)
Left patient a message to clarify if he need furosemide or torsemide refilled.  ?

## 2021-07-15 NOTE — Progress Notes (Signed)
PROVIDER NOTE: Interpretation of information contained herein should be left to medically-trained personnel. Specific patient instructions are provided elsewhere under "Patient Instructions" section of medical record. This document was created in part using STT-dictation technology, any transcriptional errors that may result from this process are unintentional.  ?Patient: [name] ?Type: Established ?DOB: 1965/07/17 ?MRN: [id number] ?PCP: Antony Contras, MD  Service: Procedure ?DOS: 07/16/2021 ?Setting: Ambulatory ?Location: Ambulatory outpatient facility ?Delivery: Face-to-face Provider: Gaspar Cola, MD ?Specialty: Interventional Pain Management ?Specialty designation: 09 ?Location: Outpatient facility ?Ref. Prov.: Antony Contras, MD   ? ?Primary Reason for Visit: Interventional Pain Management Treatment. ?CC: Back Pain ? ?Procedure:          ?Type: Management of Intrathecal Drug Delivery System (IDDS) - Reservoir Refill [phone]). No rate change.  ?Indications: ?1. Chronic low back pain (Bilateral) w/ sciatica (Bilateral)   ?2. Chronic lower extremity pain (2ry area of Pain) (Bilateral) (L>R)   ?3. Chronic lower extremity radicular pain   ?4. DDD (degenerative disc disease), lumbosacral   ?5. Failed back surgical syndrome   ?6. Lumbar facet syndrome (Bilateral) (R>L)   ?7. Lumbosacral spondylosis with radiculopathy   ?8. Weakness of back   ?9. Weakness of lower extremities (Bilateral)   ?10. Chronic pain syndrome   ?11. Epidural fibrosis   ?12. Physical tolerance to opiate drug   ?13. Presence of intrathecal pump (Medtronics)   ?14. Pharmacologic therapy   ?15. Chronic use of opiate for therapeutic purpose   ?16. Encounter for adjustment or management of infusion pump   ?17. Encounter for medication management   ?18. Encounter for chronic pain management   ? ?Pain Assessment: ?Self-Reported Pain Score: 9 /10             ?Reported level is compatible with observation.       ? ?Today we had a warning from the  pump indicating that he needs to be replaced due to end of battery life.  We will be scheduling the patient for an intrathecal cath for evaluation to determine if the catheter needs to be changed along with the pump. ?  ?Intrathecal Drug Delivery System (IDDS)  ?Pump Device:  ?Manufacturer: Medtronic ?Model: Synchromed II ?Model No.: [id number] ?Serial No.: [id number] ?Delivery Route: Intrathecal ?Type: Programmable  ?Volume (mL): 40 mL reservoir ?Priming Volume: n/a  ?Calibration Constant: 117.0  ?MRI compatibility: Conditional  ? ?Implant Details:  ?Date: 2009 ?Implanter: Glenna Fellows, MD (implant done at [facility]) ?Contact Information: n/a ?Last Revision/Replacement: n/a ?Estimated Replacement Date: Apr/2023  ?Implant Site: Abdominal ?Laterality: Left ? ?Catheter: ?Manufacturer: Medtronic ?Model:  n/a  ?Model No.: n/a  ?Serial No.: n/a  ?Implanted Length (cm): n/a  ?Catheter Volume (mL): 0.145  ?Tip Location (Level): T7 ?Canal Access Site: L1-2 (R) ? ?Drug content:  ?Primary Medication Class: Opioid  ?Medication: PF-Fentanyl  ?Concentration: 2,000.0 mcg/mL  ? ?Secondary Medication Class: Local Anesthetic  ?Medication: PF-Bupivacaine  ?Concentration: 30 mg/mL  ? ?Tertiary Medication Class: Antispasmodic  ?Medication: PF-Baclofen  ?Concentration: 200 mcg/mL  ? ?Fourth Medication Class: none  ? ?PA parameters (PCA-mode):  ?Mode: Off (Inactive) ? ?Programming:  ?Type: Simple continuous.  ?Medication, Concentration, Infusion Program, & Delivery Rate: For up-to-date details please see most recent scanned programming printout. ? ?  ?Changes:  ?Medication Change: None at this point ?Rate Change: No change in rate ? ?Reported side-effects or adverse reactions: None reported ? ?Effectiveness: Described as relatively effective, allowing for increase in activities of daily living (ADL) ?Clinically meaningful improvement in function (CMIF):  Sustained CMIF goals met ? ?Plan: Pump refill today ?  ?Pharmacotherapy  Assessment  ? ?Opioid Analgesic: Oxycodone IR 10 mg, 1 tab PO q 6 hrs (40 mg/day of oxycodone) + 52.9 mcg/hr intrathecal PF-Fentanyl (1,268.8 mcg/day of Fentanyl) ?MME/day: 60 mg/day (from oral medication) + 126.96 mg/day (from intrathecal fentanyl) = 186.96 MME/day (Aprox. 1.08 MME/day/kg).  ? ?Monitoring: ?[place] PMP: PDMP reviewed during this encounter.       ?Pharmacotherapy: No side-effects or adverse reactions reported. ?Compliance: No problems identified. ?Effectiveness: Clinically acceptable. ?Plan: Refer to "POC". UDS:  ?Summary  ?Date Value Ref Range Status  ?09/20/2020 Note  Final  ?  Comment:  ?  ==================================================================== ?[employer] Select 13 (MW) ?==================================================================== ?Test                             Result       Flag       Units ? ?Drug Present and Declared for Prescription Verification ?  Morphine                       3885         EXPECTED   ng/mg creat ?  Normorphine                    68           EXPECTED   ng/mg creat ?   Potential sources of large amounts of morphine in the absence of ?   codeine include administration of morphine or use of heroin. ? ?   Normorphine is an expected metabolite of morphine. ? ?  Fentanyl                       29           EXPECTED   ng/mg creat ?  Norfentanyl                    185          EXPECTED   ng/mg creat ?   Source of fentanyl is a scheduled prescription medication, including ?   IV, patch, and transmucosal formulations. Norfentanyl is an expected ?   metabolite of fentanyl. ? ?Drug Present not Declared for Prescription Verification ?  Hydromorphone                  212          UNEXPECTED ng/mg creat ?   Hydromorphone may be administered as a scheduled prescription ?   medication and is also an expected metabolite of hydrocodone. ?   Hydromorphone is also a minor metabolite of morphine which is also ?   present in this specimen. Concentrations of hydromorphone rarely ?    exceed 5% of the morphine concentration when metabolism of morphine ?   is the sole source of hydromorphone. ? ?==================================================================== ?Test                      Result    Flag   Units      Ref Range ?  Creatinine              188              mg/dL      >=20 ?==================================================================== ?Declared Medications: ? The flagging and interpretation on this report are based on the ? following  declared medications.  Unexpected results may arise from ? inaccuracies in the declared medications. ? ? **Note: The testing scope of this panel includes these medications: ? ? Fentanyl ? Morphine ? ? **Note: The testing scope of this panel does not include the ? following reported medications: ? ? Acetaminophen ? Aspirin ? Baclofen ? Bupivacaine ? Calcium ? Cholecalciferol ? Cyclobenzaprine ? Escitalopram ? Esomeprazole ? Gabapentin ? Ibuprofen ? Metolazone ? Naloxone ? Potassium Chloride ? Pregabalin ? Promethazine ? Rosuvastatin ? Tizanidine ? Torsemide ?==================================================================== ?For clinical consultation, please call [phone]. ?==================================================================== ?  ?  ? ?Pre-op H&P Assessment:  ?Mr. Czarnecki is a 56 y.o. (year old), male patient, seen today for interventional treatment. He  has a past surgical history that includes Back surgery; morphine pump; Tonsillectomy; Joint replacement; Shoulder arthroscopy with subacromial decompression, rotator cuff repair and bicep tendon repair (Right, 08/18/2013); Shoulder arthroscopy with subacromial decompression and bicep tendon repair (Left, 10/06/2013); Laparoscopic gastric sleeve resection (N/A, 01/08/2015); and RIGHT HEART CATH (N/A, 09/22/2019). Mr. Aker has a current medication list which includes the following prescription(s): acetaminophen, aspirin ec, duloxetine, gabapentin, ibuprofen, metolazone, NON  FORMULARY, potassium chloride, promethazine, rosuvastatin, torsemide, calcium carbonate, vitamin d3, cyclobenzaprine, escitalopram, esomeprazole, morphine, [START ON 08/15/2021] morphine, [START ON 09/14/2021] mo

## 2021-07-16 ENCOUNTER — Ambulatory Visit: Payer: No Typology Code available for payment source | Attending: Pain Medicine | Admitting: Pain Medicine

## 2021-07-16 ENCOUNTER — Encounter: Payer: Self-pay | Admitting: Pain Medicine

## 2021-07-16 VITALS — BP 150/79 | HR 100 | Temp 97.0°F | Resp 16 | Ht 71.0 in | Wt 342.0 lb

## 2021-07-16 DIAGNOSIS — M5441 Lumbago with sciatica, right side: Secondary | ICD-10-CM | POA: Diagnosis not present

## 2021-07-16 DIAGNOSIS — M5137 Other intervertebral disc degeneration, lumbosacral region: Secondary | ICD-10-CM

## 2021-07-16 DIAGNOSIS — M4727 Other spondylosis with radiculopathy, lumbosacral region: Secondary | ICD-10-CM

## 2021-07-16 DIAGNOSIS — R531 Weakness: Secondary | ICD-10-CM | POA: Diagnosis not present

## 2021-07-16 DIAGNOSIS — M79606 Pain in leg, unspecified: Secondary | ICD-10-CM | POA: Insufficient documentation

## 2021-07-16 DIAGNOSIS — M961 Postlaminectomy syndrome, not elsewhere classified: Secondary | ICD-10-CM | POA: Diagnosis not present

## 2021-07-16 DIAGNOSIS — G4730 Sleep apnea, unspecified: Secondary | ICD-10-CM | POA: Insufficient documentation

## 2021-07-16 DIAGNOSIS — Z978 Presence of other specified devices: Secondary | ICD-10-CM

## 2021-07-16 DIAGNOSIS — Z451 Encounter for adjustment and management of infusion pump: Secondary | ICD-10-CM

## 2021-07-16 DIAGNOSIS — E66813 Obesity, class 3: Secondary | ICD-10-CM

## 2021-07-16 DIAGNOSIS — J4530 Mild persistent asthma, uncomplicated: Secondary | ICD-10-CM | POA: Insufficient documentation

## 2021-07-16 DIAGNOSIS — G894 Chronic pain syndrome: Secondary | ICD-10-CM

## 2021-07-16 DIAGNOSIS — M5442 Lumbago with sciatica, left side: Secondary | ICD-10-CM

## 2021-07-16 DIAGNOSIS — R32 Unspecified urinary incontinence: Secondary | ICD-10-CM | POA: Insufficient documentation

## 2021-07-16 DIAGNOSIS — R29898 Other symptoms and signs involving the musculoskeletal system: Secondary | ICD-10-CM

## 2021-07-16 DIAGNOSIS — M545 Low back pain, unspecified: Secondary | ICD-10-CM

## 2021-07-16 DIAGNOSIS — G96198 Other disorders of meninges, not elsewhere classified: Secondary | ICD-10-CM

## 2021-07-16 DIAGNOSIS — M51379 Other intervertebral disc degeneration, lumbosacral region without mention of lumbar back pain or lower extremity pain: Secondary | ICD-10-CM

## 2021-07-16 DIAGNOSIS — K5900 Constipation, unspecified: Secondary | ICD-10-CM | POA: Insufficient documentation

## 2021-07-16 DIAGNOSIS — E7800 Pure hypercholesterolemia, unspecified: Secondary | ICD-10-CM | POA: Insufficient documentation

## 2021-07-16 DIAGNOSIS — M5117 Intervertebral disc disorders with radiculopathy, lumbosacral region: Secondary | ICD-10-CM | POA: Insufficient documentation

## 2021-07-16 DIAGNOSIS — G8929 Other chronic pain: Secondary | ICD-10-CM | POA: Diagnosis not present

## 2021-07-16 DIAGNOSIS — Z79891 Long term (current) use of opiate analgesic: Secondary | ICD-10-CM

## 2021-07-16 DIAGNOSIS — Z79899 Other long term (current) drug therapy: Secondary | ICD-10-CM

## 2021-07-16 DIAGNOSIS — M47816 Spondylosis without myelopathy or radiculopathy, lumbar region: Secondary | ICD-10-CM

## 2021-07-16 DIAGNOSIS — E1169 Type 2 diabetes mellitus with other specified complication: Secondary | ICD-10-CM | POA: Insufficient documentation

## 2021-07-16 DIAGNOSIS — F321 Major depressive disorder, single episode, moderate: Secondary | ICD-10-CM | POA: Insufficient documentation

## 2021-07-16 DIAGNOSIS — J9610 Chronic respiratory failure, unspecified whether with hypoxia or hypercapnia: Secondary | ICD-10-CM | POA: Insufficient documentation

## 2021-07-16 DIAGNOSIS — F1190 Opioid use, unspecified, uncomplicated: Secondary | ICD-10-CM

## 2021-07-16 DIAGNOSIS — K589 Irritable bowel syndrome without diarrhea: Secondary | ICD-10-CM | POA: Insufficient documentation

## 2021-07-16 MED ORDER — ORPHENADRINE CITRATE 30 MG/ML IJ SOLN
INTRAMUSCULAR | Status: AC
  Filled 2021-07-16: qty 2

## 2021-07-16 MED ORDER — MORPHINE SULFATE ER 30 MG PO TBCR: 30.0000 mg | EXTENDED_RELEASE_TABLET | Freq: Two times a day (BID) | ORAL | 0 refills | Status: DC

## 2021-07-16 MED ORDER — KETOROLAC TROMETHAMINE 60 MG/2ML IM SOLN
60.0000 mg | Freq: Once | INTRAMUSCULAR | Status: AC
  Administered 2021-07-16: 60 mg via INTRAMUSCULAR

## 2021-07-16 MED ORDER — KETOROLAC TROMETHAMINE 60 MG/2ML IM SOLN
INTRAMUSCULAR | Status: AC
  Filled 2021-07-16: qty 2

## 2021-07-16 MED ORDER — ORPHENADRINE CITRATE 30 MG/ML IJ SOLN
60.0000 mg | Freq: Once | INTRAMUSCULAR | Status: AC
  Administered 2021-07-16: 60 mg via INTRAMUSCULAR

## 2021-07-16 NOTE — Progress Notes (Signed)
Nursing Pain Medication Assessment:  ?Safety precautions to be maintained throughout the outpatient stay will include: orient to surroundings, keep bed in low position, maintain call bell within reach at all times, provide assistance with transfer out of bed and ambulation.  ?Medication Inspection Compliance: Pill count conducted under aseptic conditions, in front of the patient. Neither the pills nor the bottle was removed from the patient's sight at any time. Once count was completed pills were immediately returned to the patient in their original bottle. ? ?Medication: Morphine IR ?Pill/Patch Count:  3 of 60 pills remain ?Pill/Patch Appearance: Markings consistent with prescribed medication ?Bottle Appearance: Standard pharmacy container. Clearly labeled. ?Filled Date: 3 / 7 / 2023 ?Last Medication intake:  Today ?

## 2021-07-16 NOTE — Progress Notes (Signed)
Safety precautions to be maintained throughout the outpatient stay will include: orient to surroundings, keep bed in low position, maintain call bell within reach at all times, provide assistance with transfer out of bed and ambulation.  

## 2021-07-16 NOTE — Patient Instructions (Signed)
____________________________________________________________________________________________ ? ?Medication Rules ? ?Purpose: To inform patients, and their family members, of our rules and regulations. ? ?Applies to: All patients receiving prescriptions (written or electronic). ? ?Pharmacy of record: Pharmacy where electronic prescriptions will be sent. If written prescriptions are taken to a different pharmacy, please inform the nursing staff. The pharmacy listed in the electronic medical record should be the one where you would like electronic prescriptions to be sent. ? ?Electronic prescriptions: In compliance with the [place] Strengthen Opioid Misuse Prevention (STOP) Act of 2017 (Session Law 2017-74/H243), effective April 14, 2018, all controlled substances must be electronically prescribed. Calling prescriptions to the pharmacy will cease to exist. ? ?Prescription refills: Only during scheduled appointments. Applies to all prescriptions. ? ?NOTE: The following applies primarily to controlled substances (Opioid* Pain Medications).  ? ?Type of encounter (visit): For patients receiving controlled substances, face-to-face visits are required. (Not an option or up to the patient.) ? ?Patient's responsibilities: ?Pain Pills: Bring all pain pills to every appointment (except for procedure appointments). ?Pill Bottles: Bring pills in original pharmacy bottle. Always bring the newest bottle. Bring bottle, even if empty. ?Medication refills: You are responsible for knowing and keeping track of what medications you take and those you need refilled. ?The day before your appointment: write a list of all prescriptions that need to be refilled. ?The day of the appointment: give the list to the admitting nurse. Prescriptions will be written only during appointments. No prescriptions will be written on procedure days. ?If you forget a medication: it will not be "Called in", "Faxed", or "electronically sent". You will  need to get another appointment to get these prescribed. ?No early refills. Do not call asking to have your prescription filled early. ?Prescription Accuracy: You are responsible for carefully inspecting your prescriptions before leaving our office. Have the discharge nurse carefully go over each prescription with you, before taking them home. Make sure that your name is accurately spelled, that your address is correct. Check the name and dose of your medication to make sure it is accurate. Check the number of pills, and the written instructions to make sure they are clear and accurate. Make sure that you are given enough medication to last until your next medication refill appointment. ?Taking Medication: Take medication as prescribed. When it comes to controlled substances, taking less pills or less frequently than prescribed is permitted and encouraged. ?Never take more pills than instructed. ?Never take medication more frequently than prescribed.  ?Inform other Doctors: Always inform, all of your healthcare providers, of all the medications you take. ?Pain Medication from other Providers: You are not allowed to accept any additional pain medication from any other Doctor or Healthcare provider. There are two exceptions to this rule. (see below) In the event that you require additional pain medication, you are responsible for notifying us, as stated below. ?Cough Medicine: Often these contain an opioid, such as codeine or hydrocodone. Never accept or take cough medicine containing these opioids if you are already taking an opioid* medication. The combination may cause respiratory failure and death. ?Medication Agreement: You are responsible for carefully reading and following our Medication Agreement. This must be signed before receiving any prescriptions from our practice. Safely store a copy of your signed Agreement. Violations to the Agreement will result in no further prescriptions. (Additional copies of our  Medication Agreement are available upon request.) ?Laws, Rules, & Regulations: All patients are expected to follow all Federal and State Laws, Statutes, Rules, & Regulations. Ignorance of   the Laws does not constitute a valid excuse.  ?Illegal drugs and Controlled Substances: The use of illegal substances (including, but not limited to marijuana and its derivatives) and/or the illegal use of any controlled substances is strictly prohibited. Violation of this rule may result in the immediate and permanent discontinuation of any and all prescriptions being written by our practice. The use of any illegal substances is prohibited. ?Adopted CDC guidelines & recommendations: Target dosing levels will be at or below 60 MME/day. Use of benzodiazepines** is not recommended. ? ?Exceptions: There are only two exceptions to the rule of not receiving pain medications from other Healthcare Providers. ?Exception #1 (Emergencies): In the event of an emergency (i.e.: accident requiring emergency care), you are allowed to receive additional pain medication. However, you are responsible for: As soon as you are able, call our office [phone], at any time of the day or night, and leave a message stating your name, the date and nature of the emergency, and the name and dose of the medication prescribed. In the event that your call is answered by a member of our staff, make sure to document and save the date, time, and the name of the person that took your information.  ?Exception #2 (Planned Surgery): In the event that you are scheduled by another doctor or dentist to have any type of surgery or procedure, you are allowed (for a period no longer than 30 days), to receive additional pain medication, for the acute post-op pain. However, in this case, you are responsible for picking up a copy of our "Post-op Pain Management for Surgeons" handout, and giving it to your surgeon or dentist. This document is available at our office, and  does not require an appointment to obtain it. Simply go to our office during business hours (Monday-Thursday from 8:00 AM to 4:00 PM) (Friday 8:00 AM to 12:00 Noon) or if you have a scheduled appointment with us, prior to your surgery, and ask for it by name. In addition, you are responsible for: calling our office [phone], at any time of the day or night, and leaving a message stating your name, name of your surgeon, type of surgery, and date of procedure or surgery. Failure to comply with your responsibilities may result in termination of therapy involving the controlled substances. ?Medication Agreement Violation. Following the above rules, including your responsibilities will help you in avoiding a Medication Agreement Violation (?Breaking your Pain Medication Contract?). ? ?*Opioid medications include: morphine, codeine, oxycodone, oxymorphone, hydrocodone, hydromorphone, meperidine, tramadol, tapentadol, buprenorphine, fentanyl, methadone. ?**Benzodiazepine medications include: diazepam (Valium), alprazolam (Xanax), clonazepam (Klonopine), lorazepam (Ativan), clorazepate (Tranxene), chlordiazepoxide (Librium), estazolam (Prosom), oxazepam (Serax), temazepam (Restoril), triazolam (Halcion) ?(Last updated: 01/09/2021) ?____________________________________________________________________________________________ ? ____________________________________________________________________________________________ ? ?Medication Recommendations and Reminders ? ?Applies to: All patients receiving prescriptions (written and/or electronic). ? ?Medication Rules & Regulations: These rules and regulations exist for your safety and that of others. They are not flexible and neither are we. Dismissing or ignoring them will be considered "non-compliance" with medication therapy, resulting in complete and irreversible termination of such therapy. (See document titled "Medication Rules" for more details.) In all conscience,  because of safety reasons, we cannot continue providing a therapy where the patient does not follow instructions. ? ?Pharmacy of record:  ?Definition: This is the pharmacy where your electronic prescriptions w

## 2021-07-17 ENCOUNTER — Telehealth: Payer: Self-pay

## 2021-07-17 MED FILL — Medication: INTRATHECAL | Qty: 1 | Status: AC

## 2021-07-17 NOTE — Telephone Encounter (Signed)
Post IT pump refill follow up phone call. Patient states he is doing  good.  ?

## 2021-07-30 DIAGNOSIS — F321 Major depressive disorder, single episode, moderate: Secondary | ICD-10-CM | POA: Diagnosis not present

## 2021-08-12 ENCOUNTER — Ambulatory Visit: Payer: Medicare HMO | Admitting: Nurse Practitioner

## 2021-08-14 ENCOUNTER — Telehealth: Payer: Self-pay | Admitting: Pain Medicine

## 2021-08-14 NOTE — Telephone Encounter (Signed)
Spoke with [employer] rep and she states that his pump will continue for 3 months.  Patient notified.  ?

## 2021-08-14 NOTE — Telephone Encounter (Addendum)
Patient has pain pump and it has been beeping about a week. His next refill appt is for 09-10-21. He is workers comp and Alona Bene has sent in request for battery replacement x2.I did let patient know this ?Please call patient asap ?

## 2021-08-21 ENCOUNTER — Other Ambulatory Visit: Payer: Self-pay

## 2021-08-21 MED ORDER — PAIN MANAGEMENT IT PUMP REFILL: 1.0000 | Freq: Once | INTRATHECAL | 0 refills | Status: AC

## 2021-09-10 ENCOUNTER — Ambulatory Visit: Payer: No Typology Code available for payment source | Admitting: Pain Medicine

## 2021-09-13 DIAGNOSIS — J9611 Chronic respiratory failure with hypoxia: Secondary | ICD-10-CM | POA: Diagnosis not present

## 2021-09-13 DIAGNOSIS — Z8719 Personal history of other diseases of the digestive system: Secondary | ICD-10-CM | POA: Diagnosis not present

## 2021-09-13 DIAGNOSIS — R5381 Other malaise: Secondary | ICD-10-CM | POA: Diagnosis not present

## 2021-09-13 DIAGNOSIS — F321 Major depressive disorder, single episode, moderate: Secondary | ICD-10-CM | POA: Diagnosis not present

## 2021-09-13 DIAGNOSIS — I5032 Chronic diastolic (congestive) heart failure: Secondary | ICD-10-CM | POA: Diagnosis not present

## 2021-09-13 DIAGNOSIS — G894 Chronic pain syndrome: Secondary | ICD-10-CM | POA: Diagnosis not present

## 2021-09-13 DIAGNOSIS — E7800 Pure hypercholesterolemia, unspecified: Secondary | ICD-10-CM | POA: Diagnosis not present

## 2021-09-13 DIAGNOSIS — R7303 Prediabetes: Secondary | ICD-10-CM | POA: Diagnosis not present

## 2021-09-13 DIAGNOSIS — G4730 Sleep apnea, unspecified: Secondary | ICD-10-CM | POA: Diagnosis not present

## 2021-09-17 ENCOUNTER — Encounter: Payer: Medicare HMO | Admitting: Pain Medicine

## 2021-09-19 ENCOUNTER — Encounter: Payer: Self-pay | Admitting: Pain Medicine

## 2021-09-19 ENCOUNTER — Ambulatory Visit: Payer: No Typology Code available for payment source | Attending: Pain Medicine | Admitting: Pain Medicine

## 2021-09-19 ENCOUNTER — Ambulatory Visit
Admission: RE | Admit: 2021-09-19 | Discharge: 2021-09-19 | Disposition: A | Discharge status: Home / Self Care | Payer: Medicare HMO | Source: Ambulatory Visit | Attending: Pain Medicine | Admitting: Pain Medicine

## 2021-09-19 VITALS — BP 125/96 | HR 126 | Temp 96.3°F | Resp 20 | Ht 71.0 in | Wt 340.0 lb

## 2021-09-19 DIAGNOSIS — Z462 Encounter for fitting and adjustment of other devices related to nervous system and special senses: Secondary | ICD-10-CM | POA: Insufficient documentation

## 2021-09-19 DIAGNOSIS — M5410 Radiculopathy, site unspecified: Secondary | ICD-10-CM | POA: Diagnosis present

## 2021-09-19 DIAGNOSIS — Z6841 Body Mass Index (BMI) 40.0 and over, adult: Secondary | ICD-10-CM | POA: Insufficient documentation

## 2021-09-19 DIAGNOSIS — M79605 Pain in left leg: Secondary | ICD-10-CM | POA: Diagnosis present

## 2021-09-19 DIAGNOSIS — M4727 Other spondylosis with radiculopathy, lumbosacral region: Secondary | ICD-10-CM | POA: Insufficient documentation

## 2021-09-19 DIAGNOSIS — Z451 Encounter for adjustment and management of infusion pump: Secondary | ICD-10-CM | POA: Insufficient documentation

## 2021-09-19 DIAGNOSIS — M5442 Lumbago with sciatica, left side: Secondary | ICD-10-CM | POA: Insufficient documentation

## 2021-09-19 DIAGNOSIS — G96198 Other disorders of meninges, not elsewhere classified: Secondary | ICD-10-CM | POA: Insufficient documentation

## 2021-09-19 DIAGNOSIS — M25562 Pain in left knee: Secondary | ICD-10-CM | POA: Insufficient documentation

## 2021-09-19 DIAGNOSIS — G8929 Other chronic pain: Secondary | ICD-10-CM | POA: Insufficient documentation

## 2021-09-19 DIAGNOSIS — M5441 Lumbago with sciatica, right side: Secondary | ICD-10-CM | POA: Diagnosis present

## 2021-09-19 DIAGNOSIS — M47816 Spondylosis without myelopathy or radiculopathy, lumbar region: Secondary | ICD-10-CM | POA: Insufficient documentation

## 2021-09-19 DIAGNOSIS — T85620A Displacement of epidural and subdural infusion catheter, initial encounter: Secondary | ICD-10-CM | POA: Diagnosis present

## 2021-09-19 DIAGNOSIS — R29898 Other symptoms and signs involving the musculoskeletal system: Secondary | ICD-10-CM | POA: Diagnosis present

## 2021-09-19 DIAGNOSIS — Z96652 Presence of left artificial knee joint: Secondary | ICD-10-CM | POA: Insufficient documentation

## 2021-09-19 DIAGNOSIS — M961 Postlaminectomy syndrome, not elsewhere classified: Secondary | ICD-10-CM | POA: Diagnosis not present

## 2021-09-19 DIAGNOSIS — Z978 Presence of other specified devices: Secondary | ICD-10-CM | POA: Diagnosis not present

## 2021-09-19 DIAGNOSIS — M79604 Pain in right leg: Secondary | ICD-10-CM | POA: Insufficient documentation

## 2021-09-19 DIAGNOSIS — F1190 Opioid use, unspecified, uncomplicated: Secondary | ICD-10-CM | POA: Diagnosis not present

## 2021-09-19 DIAGNOSIS — M5137 Other intervertebral disc degeneration, lumbosacral region: Secondary | ICD-10-CM | POA: Diagnosis present

## 2021-09-19 DIAGNOSIS — T85610A Breakdown (mechanical) of epidural and subdural infusion catheter, initial encounter: Secondary | ICD-10-CM | POA: Insufficient documentation

## 2021-09-19 DIAGNOSIS — Z79899 Other long term (current) drug therapy: Secondary | ICD-10-CM | POA: Diagnosis not present

## 2021-09-19 DIAGNOSIS — G894 Chronic pain syndrome: Secondary | ICD-10-CM | POA: Diagnosis not present

## 2021-09-19 DIAGNOSIS — Z79891 Long term (current) use of opiate analgesic: Secondary | ICD-10-CM | POA: Insufficient documentation

## 2021-09-19 MED ORDER — MORPHINE SULFATE ER 30 MG PO TBCR: 30.0000 mg | EXTENDED_RELEASE_TABLET | Freq: Two times a day (BID) | ORAL | 0 refills | Status: DC

## 2021-09-19 MED ORDER — LIDOCAINE HCL 2 % IJ SOLN
INTRAMUSCULAR | Status: AC
  Filled 2021-09-19: qty 20

## 2021-09-19 MED ORDER — NALOXONE HCL 4 MG/0.1ML NA LIQD: 1.0000 | NASAL | 0 refills | Status: DC | PRN

## 2021-09-19 MED ORDER — IOHEXOL 180 MG/ML  SOLN
INTRAMUSCULAR | Status: AC
  Filled 2021-09-19: qty 20

## 2021-09-19 NOTE — Patient Instructions (Signed)

## 2021-09-19 NOTE — Progress Notes (Signed)
Safety precautions to be maintained throughout the outpatient stay will include: orient to surroundings, keep bed in low position, maintain call bell within reach at all times, provide assistance with transfer out of bed and ambulation.  

## 2021-09-19 NOTE — Progress Notes (Addendum)
PROVIDER NOTE: Interpretation of information contained herein should be left to medically-trained personnel. Specific patient instructions are provided elsewhere under "Patient Instructions" section of medical record. This document was created in part using STT-dictation technology, any transcriptional errors that may result from this process are unintentional.  Patient: [name] Type: Established DOB: March 28, 1966 MRN: [id number] PCP: Antony Contras, MD  Service: Procedure DOS: 09/19/2021 Setting: Ambulatory Location: Ambulatory outpatient facility Delivery: Face-to-face Provider: Gaspar Cola, MD Specialty: Interventional Pain Management Specialty designation: 09 Location: Outpatient facility Ref. Prov.: Antony Contras, MD    Primary Reason for Visit: Interventional Pain Management Treatment. CC: Back Pain  Procedure:          Type: Management of Intrathecal Drug Delivery System (IDDS) - Reservoir Refill [phone]). No rate change. Intrathecal catheter test under fluoroscopic guidance. Indications: 1. Failed back surgical syndrome   2. Chronic low back pain (Bilateral) w/ sciatica (Bilateral)   3. Chronic lower extremity pain (2ry area of Pain) (Bilateral) (L>R)   4. Chronic lower extremity radicular pain   5. DDD (degenerative disc disease), lumbosacral   6. Lumbar facet syndrome (Bilateral) (R>L)   7. Lumbosacral spondylosis with radiculopathy   8. Weakness of back   9. Weakness of lower extremities (Bilateral)   10. Epidural fibrosis   11. Chronic pain syndrome   12. Physical tolerance to opiate drug   13. Presence of intrathecal pump (Medtronics)   14. Pharmacologic therapy   15. Chronic use of opiate for therapeutic purpose   16. Encounter for adjustment or management of infusion pump   17. Encounter for medication management   18. Encounter for chronic pain management   19. Class 3 severe obesity due to excess calories with serious comorbidity and body mass index  (BMI) of 50.0 to 59.9 in adult [facility])    Pain Assessment: Self-Reported Pain Score: 9 /10             Reported level is compatible with observation.        The patient and his wife have indicated that his ability to do anything at home continues to decrease.  They indicate needing an electrical wheelchair with a cover, needing an access ramp at home for the wheelchair, and they are also requesting some type of aid at home 3 to 5 days a week since the patient's wife indicates that she is not able to care for some aspects of the patient's needs.  Today we brought the patient into the clinic for an intrathecal catheter test before sending the patient to have his intrathecal pump replaced.  Because of the patient's recent increase in pain, we have developed a high level of suspicion that the intrathecal pump is not longer working.  The battery on the pump will be running out in July 2023, but before going into replace the pump, it is important to check to see if the catheter is working since replacement of a pump without having a functional catheter would be a complete waste of time and resources.  For this reason the patient is coming in today for his usual refill but also to do an intrathecal catheter check.  Results: Aspiration thru pump's side-port yielded no backflow of CSF.  Interpretation: Dysfunctional intrathecal catheter with high probability of obstruction, fracture, migration, or fracture.     Intrathecal Drug Delivery System (IDDS)  Pump Device:  Manufacturer: Medtronic Model: Synchromed II Model No.: [id number] Serial No.: [id number] H Delivery Route: Intrathecal Type: Programmable  Volume (mL): 40 mL reservoir  Priming Volume: n/a  Calibration Constant: 117.0  MRI compatibility: Conditional   Implant Details:  Date: 2009 Implanter: Glenna Fellows, MD (implant done at [facility]) Contact Information: n/a Last Revision/Replacement: n/a Estimated Replacement Date: Apr/2023   Implant Site: Abdominal Laterality: Left  Catheter: Manufacturer: Medtronic Model:  n/a  Model No.: n/a  Serial No.: n/a  Implanted Length (cm): n/a  Catheter Volume (mL): 0.145  Tip Location (Level): T7 Canal Access Site: L1-2 (R)  Drug content:  Primary Medication Class: Opioid  Medication: PF-Fentanyl  Concentration: 2,000.0 mcg/mL   Secondary Medication Class: Local Anesthetic  Medication: PF-Bupivacaine  Concentration: 30 mg/mL   Tertiary Medication Class: Antispasmodic  Medication: PF-Baclofen  Concentration: 200 mcg/mL   Fourth Medication Class: none   PA parameters (PCA-mode):  Mode: Off (Inactive)  Programming:  Type: Simple continuous.  Medication, Concentration, Infusion Program, & Delivery Rate: For up-to-date details please see most recent scanned programming printout.    Changes:  Medication Change: None at this point Rate Change: No change in rate  Reported side-effects or adverse reactions: None reported  Effectiveness: Described as relatively effective, allowing for increase in activities of daily living (ADL) Clinically meaningful improvement in function (CMIF): Sustained CMIF goals met  Plan: Pump refill today w/ Analysis and programming    Pharmacotherapy Assessment   Opioid Analgesic: Extended release morphine 30 mg tablet, 1 tab p.o. twice daily (60 MME) MME/day: 60 mg/day (from oral medication) + intrathecal fentanyl.   Monitoring: [place] PMP: PDMP reviewed during this encounter.       Pharmacotherapy: No side-effects or adverse reactions reported. Compliance: No problems identified. Effectiveness: Clinically acceptable. Plan: Refer to "POC". UDS:  Summary  Date Value Ref Range Status  09/19/2021 Note  Final    Comment:    ==================================================================== ToxASSURE Select 13 (MW) ==================================================================== Test                             Result        Flag       Units  Drug Present and Declared for Prescription Verification   Morphine                       1833         EXPECTED   ng/mg creat   Normorphine                    100          EXPECTED   ng/mg creat    Potential sources of large amounts of morphine in the absence of    codeine include administration of morphine or use of heroin.     Normorphine is an expected metabolite of morphine.    Fentanyl                       38           EXPECTED   ng/mg creat   Norfentanyl                    95           EXPECTED   ng/mg creat    Source of fentanyl is a scheduled prescription medication, including    IV, patch, and transmucosal formulations. Norfentanyl is an expected    metabolite of fentanyl.  ==================================================================== Test  Result    Flag   Units      Ref Range   Creatinine              110              mg/dL      >=20 ==================================================================== Declared Medications:  The flagging and interpretation on this report are based on the  following declared medications.  Unexpected results may arise from  inaccuracies in the declared medications.   **Note: The testing scope of this panel includes these medications:   Fentanyl  Morphine (MS Contin)   **Note: The testing scope of this panel does not include the  following reported medications:   Acetaminophen (Tylenol)  Aspirin  Baclofen  Calcium  Cyclobenzaprine (Flexeril)  Duloxetine (Cymbalta)  Escitalopram (Lexapro)  Esomeprazole (Nexium)  Gabapentin (Neurontin)  Ibuprofen (Advil)  Metolazone (Zaroxolyn)  Naloxone (Narcan)  Potassium Chloride  Pregabalin (Lyrica)  Promethazine (Phenergan)  Rosuvastatin (Crestor)  Tizanidine (Zanaflex)  Torsemide (Demadex)  Vitamin D3 ==================================================================== For clinical consultation, please call [phone]. ====================================================================      Pre-op H&P Assessment:  Mr. Kleckner is a 56 y.o. (year old), male patient, seen today for interventional treatment. He  has a past surgical history that includes Back surgery; morphine pump; Tonsillectomy; Joint replacement; Shoulder arthroscopy with subacromial decompression, rotator cuff repair and bicep tendon repair (Right, 08/18/2013); Shoulder arthroscopy with subacromial decompression and bicep tendon repair (Left, 10/06/2013); Laparoscopic gastric sleeve resection (N/A, 01/08/2015); and RIGHT HEART CATH (N/A, 09/22/2019). Mr. Italiano has a current medication list which includes the following prescription(s): acetaminophen, duloxetine, escitalopram, gabapentin, ibuprofen, morphine, naloxone, NON FORMULARY, potassium chloride, promethazine, torsemide, aspirin ec, calcium carbonate, vitamin d3, clotrimazole-betamethasone, cyclobenzaprine, esomeprazole, hydromorphone, morphine, morphine, [START ON 12/13/2021] morphine, ondansetron, pregabalin, and tizanidine. His primarily concern today is the Back Pain  Initial Vital Signs:  Pulse/HCG Rate: (!) 126  Temp: (!) 96.3 F (35.7 C) Resp: 20 BP: (!) 125/96 SpO2: 100 %  BMI: Estimated body mass index is 47.42 kg/m as calculated from the following:   Height as of this encounter: '5\' 11"'  (1.803 m).   Weight as of this encounter: 340 lb (154.2 kg).  Risk Assessment: Allergies: Reviewed. He is allergic to tape.  Allergy Precautions: None required Coagulopathies: Reviewed. None identified.  Blood-thinner therapy: None at this time Active Infection(s): Reviewed. None identified. Mr. Heemstra is afebrile  Site Confirmation: Mr. Syme was asked to confirm the procedure and laterality before marking the site Procedure checklist: Completed Consent: Before the procedure and under the influence of no sedative(s), amnesic(s), or anxiolytics, the patient was informed of the  treatment options, risks and possible complications. To fulfill our ethical and legal obligations, as recommended by the American Medical Association's Code of Ethics, I have informed the patient of my clinical impression; the nature and purpose of the treatment or procedure; the risks, benefits, and possible complications of the intervention; the alternatives, including doing nothing; the risk(s) and benefit(s) of the alternative treatment(s) or procedure(s); and the risk(s) and benefit(s) of doing nothing.  Mr. Tuckerman was provided with information about the general risks and possible complications associated with most interventional procedures. These include, but are not limited to: failure to achieve desired goals, infection, bleeding, organ or nerve damage, allergic reactions, paralysis, and/or death.  In addition, he was informed of those risks and possible complications associated to this particular procedure, which include, but are not limited to: damage to the implant; failure to decrease pain; local, systemic, or  serious CNS infections, intraspinal abscess with possible cord compression and paralysis, or life-threatening such as meningitis; bleeding; organ damage; nerve injury or damage with subsequent sensory, motor, and/or autonomic system dysfunction, resulting in transient or permanent pain, numbness, and/or weakness of one or several areas of the body; allergic reactions, either minor or major life-threatening, such as anaphylactic or anaphylactoid reactions.  Furthermore, Mr. Vandevoort was informed of those risks and complications associated with the medications. These include, but are not limited to: allergic reactions (i.e.: anaphylactic or anaphylactoid reactions); endorphine suppression; bradycardia and/or hypotension; water retention and/or peripheral vascular relaxation leading to lower extremity edema and possible stasis ulcers; respiratory depression and/or shortness of breath; decreased  metabolic rate leading to weight gain; swelling or edema; medication-induced neural toxicity; particulate matter embolism and blood vessel occlusion with resultant organ, and/or nervous system infarction; and/or intrathecal granuloma formation with possible spinal cord compression and permanent paralysis.  Before refilling the pump Mr. Nishikawa was informed that some of the medications used in the devise may not be FDA approved for such use and therefore it constitutes an off-label use of the medications.  Finally, he was informed that Medicine is not an exact science; therefore, there is also the possibility of unforeseen or unpredictable risks and/or possible complications that may result in a catastrophic outcome. The patient indicated having understood very clearly. We have given the patient no guarantees and we have made no promises. Enough time was given to the patient to ask questions, all of which were answered to the patient's satisfaction. Mr. Gasaway has indicated that he wanted to continue with the procedure. Attestation: I, the ordering provider, attest that I have discussed with the patient the benefits, risks, side-effects, alternatives, likelihood of achieving goals, and potential problems during recovery for the procedure that I have provided informed consent. Date  Time: 09/19/2021 12:42 PM  Pre-Procedure Preparation:  Monitoring: As per clinic protocol. Respiration, ETCO2, SpO2, BP, heart rate and rhythm monitor placed and checked for adequate function Safety Precautions: Patient was assessed for positional comfort and pressure points before starting the procedure. Time-out: I initiated and conducted the "Time-out" before starting the procedure, as per protocol. The patient was asked to participate by confirming the accuracy of the "Time Out" information. Verification of the correct person, site, and procedure were performed and confirmed by me, the nursing staff, and the patient.  "Time-out" conducted as per Joint Commission's Universal Protocol (UP.01.01.01). Time: 1307  Description of Procedure:          Position: Supine Target Area: Central-port of intrathecal pump. Approach: Anterior, 90 degree angle approach. Area Prepped: Entire Area around the pump implant. DuraPrep (Iodine Povacrylex [0.7% available iodine] and Isopropyl Alcohol, 74% w/w) Safety Precautions: Aspiration looking for blood return was conducted prior to all injections. At no point did we inject any substances, as a needle was being advanced. No attempts were made at seeking any paresthesias. Safe injection practices and needle disposal techniques used. Medications properly checked for expiration dates. SDV (single dose vial) medications used. Description of the Procedure: Protocol guidelines were followed.  The patient was taken to fluoroscopy suite where the area was prepped and the sideport was accessed using a 25-gauge needle.  Aspiration of the sideport yielded absolutely no flow of CSF confirming that there is a problem with the intrathecal catheter.  Because there is no backflow, I did not inject any contrast since it did have the potential of pushing the medication that is in the catheter into an unknown  area.  This could result in a larger than desired bolus of medication and possible side effects.  At this point it is my opinion that the intrathecal catheter its either outside of the intrathecal space or it is kinked in a manner that it is on reliably providing medication to the patient.  For this reason, today we are entering a referral to neurosurgery for replacement of the intrathecal pump and catheter.  We do realize that because of the patient's weight, this may not be a viable option, in which case we will simply have the surgeon remove the intrathecal pump and catheter. The rest of the refill was conducted as per protocol. Two nurses trained to do implant refills were present during the entire  procedure. The refill medication was checked by both healthcare providers as well as the patient. The patient was included in the "Time-out" to verify the medication. The patient was placed in position. The pump was identified. The area was prepped in the usual manner. The sterile template was positioned over the pump, making sure the side-port location matched that of the pump. Both, the pump and the template were held for stability. The needle provided in the Medtronic Kit was then introduced thru the center of the template and into the central port. The pump content was aspirated and discarded volume documented. The new medication was slowly infused into the pump, thru the filter, making sure to avoid overpressure of the device. The needle was then removed and the area cleansed, making sure to leave some of the prepping solution back to take advantage of its long term bactericidal properties. The pump was interrogated and programmed to reflect the correct medication, volume, and dosage. The program was printed and taken to the physician for approval. Once checked and signed by the physician, a copy was provided to the patient and another scanned into the EMR.  Vitals:   09/19/21 1241  BP: (!) 125/96  Pulse: (!) 126  Resp: 20  Temp: (!) 96.3 F (35.7 C)  SpO2: 100%  Weight: (!) 340 lb (154.2 kg)  Height: '5\' 11"'  (1.803 m)    Start Time: 1324 hrs. End Time: 1338 hrs. Materials & Medications: Medtronic Refill Kit Medication(s): Please see chart orders for details.  Imaging Guidance:          Type of Imaging Technique: Fluoroscopy Guidance (Non-spinal) Indication(s): Morbid obesity. Assistance in needle guidance and placement for procedures requiring needle placement in or near specific anatomical locations impossible to access without such assistance. Exposure Time: Please see nurses notes. Contrast: None used. Fluoroscopic Guidance: I was personally present during the use of fluoroscopy.  "Tunnel Vision Technique" used to obtain the best possible view of the target area. Parallax error corrected before commencing the procedure. "Direction-depth-direction" technique used to introduce the needle under continuous pulsed fluoroscopy. Once target was reached, antero-posterior, oblique, and lateral fluoroscopic projection used confirm needle placement in all planes. Images permanently stored in EMR. Ultrasound Guidance: N/A Interpretation: No contrast injected. I personally interpreted the imaging intraoperatively. Adequate needle placement confirmed in multiple planes. Permanent images saved into the patient's record.  Fluoroscopy was used to access the intrathecal pump sideport as well as later accessing the primary central port for the pump refill.  Thanks to fluoroscopy, this was done without any problems.  Antibiotic Prophylaxis:   Anti-infectives (From admission, onward)    None      Indication(s): None identified  Post-operative Assessment:  Post-procedure Vital Signs:  Pulse/HCG Rate: (!) 126  Temp: (!) 96.3 F (35.7 C) Resp: 20 BP: (!) 125/96 SpO2: 100 %  EBL: None  Complications: No immediate post-treatment complications observed by team, or reported by patient.  Note: The patient tolerated the entire procedure well. A repeat set of vitals were taken after the procedure and the patient was kept under observation following institutional policy, for this type of procedure. Post-procedural neurological assessment was performed, showing return to baseline, prior to discharge. The patient was provided with post-procedure discharge instructions, including a section on how to identify potential problems. Should any problems arise concerning this procedure, the patient was given instructions to immediately contact us, at any time, without hesitation. In any case, we plan to contact the patient by telephone for a follow-up status report regarding this interventional  procedure.  Comments:  No additional relevant information.  Plan of Care  Orders:  Orders Placed This Encounter  Procedures   PUMP REFILL    Maintain Protocol by having two(2) healthcare providers during procedure and programming.    Scheduling Instructions:     Please refill intrathecal pump today.    Order Specific Question:   Where will this procedure be performed?    Answer:   [facility]   PUMP REFILL    Whenever possible schedule on a procedure today.    Standing Status:   Future    Standing Expiration Date:   01/19/2022    Scheduling Instructions:     Please schedule intrathecal pump refill based on pump programming. Avoid schedule intervals of more than 120 days (4 months).    Order Specific Question:   Where will this procedure be performed?    Answer:   [facility]   DG PAIN CLINIC C-ARM 1-60 MIN NO REPORT    Intraoperative interpretation by procedural physician at [facility].    Standing Status:   Standing    Number of Occurrences:   1    Order Specific Question:   Reason for exam:    Answer:   Assistance in needle guidance and placement for procedures requiring needle placement in or near specific anatomical locations not easily accessible without such assistance.   ToxASSURE Select 13 (MW), Urine    Volume: 30 ml(s). Minimum 3 ml of urine is needed. Document temperature of fresh sample. Indications: Long term (current) use of opiate analgesic (Z79.891)    Order Specific Question:   Release to patient    Answer:   Immediate   Ambulatory referral to Neurosurgery    Referral Priority:   Routine    Referral Type:   Surgical    Referral Reason:   Specialty Services Required    Referred to Provider:   Deetta Perla, MD    Number of Visits Requested:   1   Informed Consent Details: Physician/Practitioner Attestation; Transcribe to consent form and obtain patient signature    Transcribe to consent form and obtain patient signature.    Order  Specific Question:   Physician/Practitioner attestation of informed consent for procedure/surgical case    Answer:   I, the physician/practitioner, attest that I have discussed with the patient the benefits, risks, side effects, alternatives, likelihood of achieving goals and potential problems during recovery for the procedure that I have provided informed consent.    Order Specific Question:   Procedure    Answer:   Intrathecal pump refill    Order Specific Question:   Physician/Practitioner performing the procedure    Answer:   Attending Physician: Kathlen Brunswick.  Dossie Arbour, MD & designated trained staff    Order Specific Question:   Indication/Reason    Answer:   Chronic Pain Syndrome (G89.4), presence of an intrathecal pump (Z97.8)   Chronic Opioid Analgesic:  Oxycodone IR 10 mg, 1 tab PO q 6 hrs (40 mg/day of oxycodone) + 52.9 mcg/hr intrathecal PF-Fentanyl (1,268.8 mcg/day of Fentanyl) MME/day: 60 mg/day (from oral medication) + 126.96 mg/day (from intrathecal fentanyl) = 186.96 MME/day (Aprox. 1.08 MME/day/kg).   Medications ordered for procedure: Meds ordered this encounter  Medications   morphine (MS CONTIN) 30 MG 12 hr tablet    Sig: Take 1 tablet (30 mg total) by mouth every 12 (twelve) hours. Must last 30 days. Do not break tablet    Dispense:  60 tablet    Refill:  0    DO NOT: delete (not duplicate); no partial-fill (will deny script to complete), no refill request (F/U required). DISPENSE: 1 day early if closed on fill date. WARN: No CNS-depressants within 8 hrs of med.   morphine (MS CONTIN) 30 MG 12 hr tablet    Sig: Take 1 tablet (30 mg total) by mouth every 12 (twelve) hours. Must last 30 days. Do not break tablet    Dispense:  60 tablet    Refill:  0    DO NOT: delete (not duplicate); no partial-fill (will deny script to complete), no refill request (F/U required). DISPENSE: 1 day early if closed on fill date. WARN: No CNS-depressants within 8 hrs of med.   morphine (MS  CONTIN) 30 MG 12 hr tablet    Sig: Take 1 tablet (30 mg total) by mouth every 12 (twelve) hours. Must last 30 days. Do not break tablet    Dispense:  60 tablet    Refill:  0    DO NOT: delete (not duplicate); no partial-fill (will deny script to complete), no refill request (F/U required). DISPENSE: 1 day early if closed on fill date. WARN: No CNS-depressants within 8 hrs of med.   naloxone (NARCAN) nasal spray 4 mg/0.1 mL    Sig: Place 1 spray into the nose as needed for up to 365 doses (for opioid-induced respiratory depresssion). In case of emergency (overdose), spray once into each nostril. If no response within 3 minutes, repeat application and call [phone].    Dispense:  1 each    Refill:  0    Instruct patient in proper use of device.   Medications administered: Jeneen Rinks B. Pursley had no medications administered during this visit.  See the medical record for exact dosing, route, and time of administration.  Follow-up plan:   Return for Pump Refill (Max:80mo.       Interventional Therapies  Risk  Complexity Considerations:   Estimated body mass index is 41.84 kg/m as calculated from the following:   Height as of this encounter: '5\' 11"'  (1.803 m).   Weight as of this encounter: 300 lb (136.1 kg).    NOTE: NO RFA until BMI <35  Severe morbid obesity with BMI > 45  Obesity associated medical problems: Type 2 diabetes; obstructive sleep apnea; gastroesophageal reflux disease; congestive heart failure; osteoarthritis affecting multiple joints; knee, hip, and lumbar spine osteoarthritis.   Planned  Pending:   Diagnostic intrathecal catheter evaluation under fluoroscopic guidance  Palliative intrathecal pump refill and maintenance    Under consideration:   Diagnostic left Genicular NB  Diagnostic caudal ESI + epidurogram  Possible Racz procedure  Diagnostic bilateral IA shoulder joint injection  Diagnostic bilateral suprascapular  NB    Completed:   Palliative intrathecal pump  refill and management  Diagnostic bilateral lumbar facet MBB x1 (12/24/2017) (90/90/10/0-25)  Diagnostic intrathecal catheter test x1 (09/19/2021) (results: No free flow of CSF suggesting intrathecal catheter malfunction)    Therapeutic  Palliative (PRN) options:   Palliative/therapeutic intrathecal pump refill and maintenance  Diagnostic bilateral lumbar facet MBB #2     Recent Visits Date Type Provider Dept  09/19/21 Procedure visit Milinda Pointer, MD [facility]  Showing recent visits within past 90 days and meeting all other requirements Today's Visits Date Type Provider Dept  11/21/21 Appointment Milinda Pointer, MD [facility]  Showing today's visits and meeting all other requirements Future Appointments No visits were found meeting these conditions. Showing future appointments within next 90 days and meeting all other requirements  Disposition: Discharge home  Discharge (Date  Time): 09/19/2021; 1400 hrs.   Primary Care Physician: Antony Contras, MD Location: [facility] Note by: Gaspar Cola, MD Date: 09/19/2021; Time: 7:19 AM  Disclaimer:  Medicine is not an Chief Strategy Officer. The only guarantee in medicine is that nothing is guaranteed. It is important to note that the decision to proceed with this intervention was based on the information collected from the patient. The Data and conclusions were drawn from the patient's questionnaire, the interview, and the physical examination. Because the information was provided in large part by the patient, it cannot be guaranteed that it has not been purposely or unconsciously manipulated. Every effort has been made to obtain as much relevant data as possible for this evaluation. It is important to note that the conclusions that lead to this procedure are derived in large part from the available data. Always take into account that the treatment will also be dependent on availability of  resources and existing treatment guidelines, considered by other Pain Management Practitioners as being common knowledge and practice, at the time of the intervention. For Medico-Legal purposes, it is also important to point out that variation in procedural techniques and pharmacological choices are the acceptable norm. The indications, contraindications, technique, and results of the above procedure should only be interpreted and judged by a Board-Certified Interventional Pain Specialist with extensive familiarity and expertise in the same exact procedure and technique.

## 2021-09-20 ENCOUNTER — Telehealth: Payer: Self-pay | Admitting: *Deleted

## 2021-09-20 MED FILL — Medication: INTRATHECAL | Qty: 1 | Status: AC

## 2021-09-20 NOTE — Telephone Encounter (Signed)
Post procedure call:   no  questions or concerns.  

## 2021-09-24 LAB — TOXASSURE SELECT 13 (MW), URINE

## 2021-10-28 ENCOUNTER — Other Ambulatory Visit: Payer: Self-pay

## 2021-10-28 MED ORDER — PAIN MANAGEMENT IT PUMP REFILL: 1.0000 | Freq: Once | INTRATHECAL | 0 refills | Status: AC

## 2021-10-31 ENCOUNTER — Encounter (HOSPITAL_COMMUNITY): Payer: Self-pay | Admitting: Emergency Medicine

## 2021-10-31 ENCOUNTER — Telehealth: Payer: Self-pay

## 2021-10-31 ENCOUNTER — Emergency Department (HOSPITAL_COMMUNITY)
Admission: EM | Admit: 2021-10-31 | Discharge: 2021-10-31 | Disposition: A | Discharge status: Home / Self Care | Payer: No Typology Code available for payment source | Attending: Emergency Medicine | Admitting: Emergency Medicine

## 2021-10-31 ENCOUNTER — Telehealth: Payer: Self-pay | Admitting: Pain Medicine

## 2021-10-31 DIAGNOSIS — M545 Low back pain, unspecified: Secondary | ICD-10-CM | POA: Diagnosis not present

## 2021-10-31 DIAGNOSIS — G8929 Other chronic pain: Secondary | ICD-10-CM | POA: Diagnosis not present

## 2021-10-31 DIAGNOSIS — M5440 Lumbago with sciatica, unspecified side: Secondary | ICD-10-CM

## 2021-10-31 DIAGNOSIS — Z7982 Long term (current) use of aspirin: Secondary | ICD-10-CM | POA: Diagnosis not present

## 2021-10-31 DIAGNOSIS — Z743 Need for continuous supervision: Secondary | ICD-10-CM | POA: Diagnosis not present

## 2021-10-31 DIAGNOSIS — M5441 Lumbago with sciatica, right side: Secondary | ICD-10-CM | POA: Insufficient documentation

## 2021-10-31 DIAGNOSIS — Z79899 Other long term (current) drug therapy: Secondary | ICD-10-CM | POA: Insufficient documentation

## 2021-10-31 LAB — CBC WITH DIFFERENTIAL/PLATELET
Abs Immature Granulocytes: 0.02 10*3/uL (ref 0.00–0.07)
Basophils Absolute: 0 10*3/uL (ref 0.0–0.1)
Basophils Relative: 1 %
Eosinophils Absolute: 0.1 10*3/uL (ref 0.0–0.5)
Eosinophils Relative: 1 %
HCT: 42.5 % (ref 39.0–52.0)
Hemoglobin: 14.2 g/dL (ref 13.0–17.0)
Immature Granulocytes: 0 %
Lymphocytes Relative: 23 %
Lymphs Abs: 1.5 10*3/uL (ref 0.7–4.0)
MCH: 31.7 pg (ref 26.0–34.0)
MCHC: 33.4 g/dL (ref 30.0–36.0)
MCV: 94.9 fL (ref 80.0–100.0)
Monocytes Absolute: 0.3 10*3/uL (ref 0.1–1.0)
Monocytes Relative: 5 %
Neutro Abs: 4.7 10*3/uL (ref 1.7–7.7)
Neutrophils Relative %: 70 %
Platelets: 264 10*3/uL (ref 150–400)
RBC: 4.48 MIL/uL (ref 4.22–5.81)
RDW: 12.7 % (ref 11.5–15.5)
WBC: 6.6 10*3/uL (ref 4.0–10.5)
nRBC: 0 % (ref 0.0–0.2)

## 2021-10-31 LAB — COMPREHENSIVE METABOLIC PANEL
ALT: 18 U/L (ref 0–44)
AST: 20 U/L (ref 15–41)
Albumin: 3.2 g/dL — ABNORMAL LOW (ref 3.5–5.0)
Alkaline Phosphatase: 73 U/L (ref 38–126)
Anion gap: 11 (ref 5–15)
BUN: 9 mg/dL (ref 6–20)
CO2: 28 mmol/L (ref 22–32)
Calcium: 8.8 mg/dL — ABNORMAL LOW (ref 8.9–10.3)
Chloride: 99 mmol/L (ref 98–111)
Creatinine, Ser: 0.61 mg/dL (ref 0.61–1.24)
GFR, Estimated: >60 mL/min (ref >60)
Glucose, Bld: 135 mg/dL — ABNORMAL HIGH (ref 70–99)
Potassium: 4 mmol/L (ref 3.5–5.1)
Sodium: 138 mmol/L (ref 135–145)
Total Bilirubin: 0.4 mg/dL (ref 0.3–1.2)
Total Protein: 7.2 g/dL (ref 6.5–8.1)

## 2021-10-31 LAB — LIPASE, BLOOD: Lipase: 26 U/L (ref 11–51)

## 2021-10-31 MED ORDER — FENTANYL CITRATE PF 50 MCG/ML IJ SOSY
100.0000 ug | PREFILLED_SYRINGE | Freq: Once | INTRAMUSCULAR | Status: AC
  Administered 2021-10-31: 100 ug via INTRAVENOUS
  Filled 2021-10-31: qty 2

## 2021-10-31 MED ORDER — CYCLOBENZAPRINE HCL 10 MG PO TABS
10.0000 mg | ORAL_TABLET | Freq: Once | ORAL | Status: AC
  Administered 2021-10-31: 10 mg via ORAL
  Filled 2021-10-31: qty 1

## 2021-10-31 MED ORDER — CLOTRIMAZOLE-BETAMETHASONE 1-0.05 % EX CREA: TOPICAL_CREAM | CUTANEOUS | 0 refills | Status: DC

## 2021-10-31 MED ORDER — HYDROMORPHONE HCL 4 MG PO TABS: 4.0000 mg | ORAL_TABLET | Freq: Four times a day (QID) | ORAL | 0 refills | Status: DC | PRN

## 2021-10-31 MED ORDER — SODIUM CHLORIDE 0.9 % IV BOLUS
1000.0000 mL | Freq: Once | INTRAVENOUS | Status: AC
  Administered 2021-10-31: 1000 mL via INTRAVENOUS

## 2021-10-31 MED ORDER — SODIUM CHLORIDE 0.9 % IV SOLN
25.0000 mg | Freq: Once | INTRAVENOUS | Status: AC
  Administered 2021-10-31: 25 mg via INTRAVENOUS
  Filled 2021-10-31: qty 25

## 2021-10-31 MED ORDER — ONDANSETRON 4 MG PO TBDP: ORAL_TABLET | ORAL | 1 refills | Status: DC

## 2021-10-31 NOTE — Telephone Encounter (Signed)
error 

## 2021-10-31 NOTE — Telephone Encounter (Signed)
Look in the chart and Mr Gabay is receiving msContin 30mg  Q12 but they are wanting something else.

## 2021-10-31 NOTE — ED Provider Notes (Signed)
[facility]-EMERGENCY DEPT Provider Note   CSN: [id number] Arrival date & time: 10/31/21  1519     History  Chief Complaint  Patient presents with   Back Pain    [name] is a 56 y.o. male.  Patient has a continuous intrathecal infusion that has stopped.  He has chronic back pain  The history is provided by the patient and medical records. No language interpreter was used.  Back Pain Location:  Lumbar spine Quality:  Aching Radiates to:  Does not radiate Pain severity:  Moderate Onset quality:  Gradual Timing:  Constant Progression:  Worsening Chronicity:  Recurrent Context: not emotional stress   Relieved by:  Nothing Worsened by:  Nothing Associated symptoms: no abdominal pain, no chest pain and no headaches        Home Medications Prior to Admission medications   Medication Sig Start Date End Date Taking? Authorizing Provider  acetaminophen (TYLENOL) 500 MG tablet Take 1,000 mg by mouth every 6 (six) hours as needed for mild pain or moderate pain.    Yes [provider]  aspirin EC 81 MG tablet Take 81 mg by mouth daily.   Yes [provider]  clotrimazole-betamethasone (LOTRISONE) cream Apply to affected area 2 times day for one week 10/31/21  Yes Bethann Berkshire, MD  cyclobenzaprine (FLEXERIL) 10 MG tablet Take 1 tablet (10 mg total) by mouth at bedtime. Patient taking differently: Take 10 mg by mouth daily as needed for muscle spasms. 10/20/19 10/31/21 Yes [name], MD  DULoxetine (CYMBALTA) 60 MG capsule Take 60 mg by mouth daily.   Yes [provider]  esomeprazole (NEXIUM) 40 MG capsule Take 1 capsule (40 mg total) by mouth daily. Patient taking differently: Take 40 mg by mouth 2 (two) times daily before a meal. 12/27/18 10/31/21 Yes [name], MD  gabapentin (NEURONTIN) 600 MG tablet Take 600 mg by mouth in the morning, at noon, in the evening, and at bedtime.   Yes [provider]   HYDROmorphone (DILAUDID) 4 MG tablet Take 1 tablet (4 mg total) by mouth every 6 (six) hours as needed for severe pain. 10/31/21  Yes Bethann Berkshire, MD  ibuprofen (ADVIL) 200 MG tablet Take 400 mg by mouth every 6 (six) hours as needed for mild pain or moderate pain.   Yes [provider]  morphine (MS CONTIN) 30 MG 12 hr tablet Take 1 tablet (30 mg total) by mouth every 12 (twelve) hours. Must last 30 days. Do not break tablet 10/14/21 11/13/21 Yes [name], MD  naloxone [facility]) nasal spray 4 mg/0.1 mL Place 1 spray into the nose as needed for up to 365 doses (for opioid-induced respiratory depresssion). In case of emergency (overdose), spray once into each nostril. If no response within 3 minutes, repeat application and call 911. 09/19/21 09/19/22 Yes [name], MD  NON FORMULARY 1,395.2 mcg by Intrathecal route daily. IT pump Fentanyl 2,000.0 mcg/ml Bupivicaine 20.0 mg/ml Baclofen 200.0 mcg/ml 24 dose 1395.2 mcg/day 40 ml pump   Yes [provider]  ondansetron (ZOFRAN-ODT) 4 MG disintegrating tablet 4mg  ODT q4 hours prn nausea/vomit 10/31/21  Yes 11/02/21, MD  promethazine (PHENERGAN) 25 MG tablet Take 25-50 mg by mouth every 6 (six) hours as needed for nausea or vomiting.  05/26/19  Yes [provider]  tiZANidine (ZANAFLEX) 4 MG tablet Take 1 tablet (4 mg total) by mouth every 8 (eight) hours as needed for muscle spasms. 10/20/19 10/31/21 Yes 11/02/21, MD  torsemide (DEMADEX) 20 MG tablet Take 2 tablets (40 mg total) by mouth daily. 09/14/19  Yes Robbie Lis M, PA-C  calcium carbonate (CALCIUM 600) 600 MG TABS tablet Take 1 tablet (600 mg total) by mouth 2 (two) times daily with a meal. 12/27/18 05/14/21  [name], MD  Cholecalciferol (VITAMIN D3) 125 MCG (5000 UT) CAPS Take 1 capsule (5,000 Units total) by mouth daily with breakfast. Take along with calcium and magnesium. Patient taking differently: Take 5,000 Units by mouth  daily with breakfast. Take along with calcium and magnesium. 12/27/18 05/14/21  [name], MD  escitalopram (LEXAPRO) 20 MG tablet Take 20 mg by mouth daily. Patient not taking: Reported on 10/31/2021 05/31/19   [provider]  morphine (MS CONTIN) 30 MG 12 hr tablet Take 1 tablet (30 mg total) by mouth every 12 (twelve) hours. Must last 30 days. Do not break tablet 09/14/21 10/14/21  [name], MD  morphine (MS CONTIN) 30 MG 12 hr tablet Take 1 tablet (30 mg total) by mouth every 12 (twelve) hours. Must last 30 days. Do not break tablet Patient not taking: Reported on 10/31/2021 11/13/21 12/13/21  [name], MD  morphine (MS CONTIN) 30 MG 12 hr tablet Take 1 tablet (30 mg total) by mouth every 12 (twelve) hours. Must last 30 days. Do not break tablet Patient not taking: Reported on 10/31/2021 12/13/21 01/12/22  [name], MD  potassium chloride (MICRO-K) 10 MEQ CR capsule Take 2 capsules (20 mEq total) by mouth daily. Patient not taking: Reported on 10/31/2021 09/14/19   Robbie Lis M, PA-C  pregabalin (LYRICA) 150 MG capsule Take 1 capsule (150 mg total) by mouth 3 (three) times daily. Patient not taking: Reported on 10/31/2021 10/20/19 10/31/21  [name], MD      Allergies    Tape    Review of Systems   Review of Systems  Constitutional:  Negative for appetite change and fatigue.  HENT:  Negative for congestion, ear discharge and sinus pressure.   Eyes:  Negative for discharge.  Respiratory:  Negative for cough.   Cardiovascular:  Negative for chest pain.  Gastrointestinal:  Negative for abdominal pain and diarrhea.  Genitourinary:  Negative for frequency and hematuria.  Musculoskeletal:  Positive for back pain.  Skin:  Negative for rash.  Neurological:  Negative for seizures and headaches.  Psychiatric/Behavioral:  Negative for hallucinations.     Physical Exam Updated Vital Signs BP (!) 153/118 (BP Location: Right Arm)   Pulse 88   Temp  98.3 F (36.8 C) (Oral)   Resp 18   SpO2 100%  Physical Exam Vitals and nursing note reviewed.  Constitutional:      Appearance: He is well-developed.  HENT:     Head: Normocephalic.     Nose: Nose normal.  Eyes:     General: No scleral icterus.    Conjunctiva/sclera: Conjunctivae normal.  Neck:     Thyroid: No thyromegaly.  Cardiovascular:     Rate and Rhythm: Normal rate and regular rhythm.     Heart sounds: No murmur heard.    No friction rub. No gallop.  Pulmonary:     Breath sounds: No stridor. No wheezing or rales.  Chest:     Chest wall: No tenderness.  Abdominal:     General: There is no distension.     Tenderness: There is no abdominal tenderness. There is no rebound.  Musculoskeletal:        General: Normal range of motion.  Cervical back: Neck supple.     Comments: Tender lumbar spine  Lymphadenopathy:     Cervical: No cervical adenopathy.  Skin:    Findings: No erythema or rash.  Neurological:     Mental Status: He is alert and oriented to person, place, and time.     Motor: No abnormal muscle tone.     Coordination: Coordination normal.  Psychiatric:        Behavior: Behavior normal.     ED Results / Procedures / Treatments   Labs (all labs ordered are listed, but only abnormal results are displayed) Labs Reviewed  COMPREHENSIVE METABOLIC PANEL - Abnormal; Notable for the following components:      Result Value   Glucose, Bld 135 (*)    Calcium 8.8 (*)    Albumin 3.2 (*)    All other components within normal limits  CBC WITH DIFFERENTIAL/PLATELET  LIPASE, BLOOD    EKG None  Radiology No results found.  Procedures Procedures    Medications Ordered in ED Medications  fentaNYL (SUBLIMAZE) injection 100 mcg (has no administration in time range)  sodium chloride 0.9 % bolus 1,000 mL (0 mLs Intravenous Stopped 10/31/21 1749)  promethazine (PHENERGAN) 25 mg in sodium chloride 0.9 % 50 mL IVPB (0 mg Intravenous Stopped 10/31/21 1750)   fentaNYL (SUBLIMAZE) injection 100 mcg (100 mcg Intravenous Given 10/31/21 1632)  fentaNYL (SUBLIMAZE) injection 100 mcg (100 mcg Intravenous Given 10/31/21 1831)  cyclobenzaprine (FLEXERIL) tablet 10 mg (10 mg Oral Given 10/31/21 1852)    ED Course/ Medical Decision Making/ A&P                           Medical Decision Making Risk Prescription drug management.  This patient presents to the ED for concern of back pain, this involves an extensive number of treatment options, and is a complaint that carries with it a high risk of complications and morbidity.  The differential diagnosis includes exacerbation of chronic back pain   Co morbidities that complicate the patient evaluation  Chronic back pain, obesity   Additional history obtained:  Additional history obtained from family External records from outside source obtained and reviewed including hospital records   Lab Tests:  I Ordered, and personally interpreted labs.  The pertinent results include: CBC and chemistries negative   Imaging Studies ordered:  No x-rays done  Cardiac Monitoring: / EKG:  The patient was maintained on a cardiac monitor.  I personally viewed and interpreted the cardiac monitored which showed an underlying rhythm of nsr   Consultations Obtained:  No consultant Problem List / ED Course / Critical interventions / Medication management  Chronic back pain I ordered medication including fentanyl for pain Reevaluation of the patient after these medicines showed that the patient improved I have reviewed the patients home medicines and have made adjustments as needed   Social Determinants of Health:  None   Test / Admission - Considered:  None  Patient with chronic back pain and fungus infection.  He is given Percocets and will follow-up with Dr.        Final Clinical Impression(s) / ED Diagnoses Final diagnoses:  Acute right-sided low back pain with sciatica, sciatica  laterality unspecified    Rx / DC Orders ED Discharge Orders          Ordered    clotrimazole-betamethasone (LOTRISONE) cream        10/31/21 1817    HYDROmorphone (DILAUDID) 4 MG  tablet  Every 6 hours PRN        10/31/21 1921    ondansetron (ZOFRAN-ODT) 4 MG disintegrating tablet        10/31/21 Janeth Rase, MD 11/03/21 [phone]

## 2021-10-31 NOTE — ED Triage Notes (Signed)
Patient here from home reporting back pain. States that he had a pain pump on that has recently run out of fentanyl. Worries about withdrawal and pain.

## 2021-10-31 NOTE — ED Provider Triage Note (Signed)
Emergency Medicine Provider Triage Evaluation Note  [name] , a 56 y.o. male  was evaluated in triage.  Pt complains of chronic back pain and anticipated failure of fentanyl delivery system.  Has been beeping for the last 3 months to indicate need for replacement.  Has been trying to set up appointments, is usually seen at [facility] ED, however was unable to set up follow-up.  Complaining of N/V/D since yesterday.  Denies fevers, chest pain, shortness of breath.  Review of Systems  Positive:  Negative: As above  Physical Exam  BP (!) 137/93 (BP Location: Right Arm)   Pulse (!) 104   Temp 98.3 F (36.8 C) (Oral)   Resp 18   SpO2 100%  Gen:   Awake, no distress   Resp:  Normal effort  MSK:   Moves extremities without difficulty   Medical Decision Making  Medically screening exam initiated at 3:52 PM.  Appropriate orders placed.  [name] was informed that the remainder of the evaluation will be completed by another provider, this initial triage assessment does not replace that evaluation, and the importance of remaining in the ED until their evaluation is complete.     Cecil Cobbs, PA-C 10/31/21 1556

## 2021-10-31 NOTE — Telephone Encounter (Signed)
Patients wife called and stated that Shyne pump is out of commission and is not working. Therefore, he is not getting any pain medications and is hurting. He has an appt with Dr Adriana Simas for consult on 11/19/21 and not sure when he will be able to get the pump changed. Mrs Brault is stating that he needs something today and demands that Dr [name] do something to help him today. Informed patient that MD is in the OR this morning and will talk to him as soon as possible and will call her back with his recommendations.

## 2021-10-31 NOTE — Discharge Instructions (Signed)
Follow-up with your doctors at [facility]

## 2021-10-31 NOTE — Telephone Encounter (Signed)
Talked with [name] about patients current situation. He states that he will review his chart . Patient is currently getting Mscontin 30mg  q12. Brian stated that that was given because he felt like the pump was not working. He feels like he will not be prescribing anything else.  I called patient and talked with [name] and he was upset and stated that he needs something else. I told him that Brian Synetta Fail prescribed mso4 thinking the pump was not working and that hh probably would not be prescribing anything else. [name] is going to review his chart to see what he could do. [name] stating he needs to put me in the hospital then.Informed the patient that he could always go to the ED if he felt like he needed too.

## 2021-10-31 NOTE — Telephone Encounter (Signed)
The patient called and said he is on his way to the ER because he needs help and he is not getting any. He states his pump is not working.

## 2021-11-11 ENCOUNTER — Telehealth: Payer: Self-pay

## 2021-11-11 ENCOUNTER — Telehealth: Payer: Self-pay | Admitting: Pain Medicine

## 2021-11-11 MED ORDER — PAIN MANAGEMENT IT PUMP REFILL: 1.0000 | Freq: Once | INTRATHECAL | 0 refills | Status: AC

## 2021-11-11 NOTE — Telephone Encounter (Signed)
Patient called in requesting additional pain meds.  Actually his Case worker called on his behalf and stated that he was out of medication.  When I spoke with patient he states that the pain medicine is not working and he needs something else.  Explained that [facility] was not going to give him anything else.  Patient is asking if he should keep his appt for 11/21/21 for pump refill.  Consulted with [facility], he states that the pump is still infusing so he will need to have appt to keep the pump full of medicine so that it will have something to infuse. Patient verbalizes u/o information.

## 2021-11-11 NOTE — Telephone Encounter (Signed)
Called to confirm that patient will be her on 11/21/21 for pump refill. Talked with Dr Dorris Carnes and he thinks it is best for pump to get refilled. Patient with understanding.

## 2021-11-11 NOTE — Telephone Encounter (Signed)
Case manager Chip Boer called on the be half of patient. CM stated that the patient pump isn't working. Pump hadn't work since 10-31-21. PT went to the ER on 10-31-21 ,patient was giving meds and has gave out now. PT has an appt scheduled for 11-21-21. Pleaae give patient a call. Thanks

## 2021-11-19 DIAGNOSIS — G894 Chronic pain syndrome: Secondary | ICD-10-CM | POA: Diagnosis not present

## 2021-11-20 NOTE — Progress Notes (Unsigned)
PROVIDER NOTE: Interpretation of information contained herein should be left to medically-trained personnel. Specific patient instructions are provided elsewhere under "Patient Instructions" section of medical record. This document was created in part using STT-dictation technology, any transcriptional errors that may result from this process are unintentional.  Patient: [name] Type: Established DOB: Oct 05, 1965 MRN: [id number] PCP: Antony Contras, MD  Service: Procedure DOS: 11/21/2021 Setting: Ambulatory Location: Ambulatory outpatient facility Delivery: Face-to-face Provider: Gaspar Cola, MD Specialty: Interventional Pain Management Specialty designation: 09 Location: Outpatient facility Ref. Prov.: Antony Contras, MD    Primary Reason for Visit: Interventional Pain Management Treatment. CC: No chief complaint on file.  Procedure:          Type: Management of Intrathecal Drug Delivery System (IDDS) - Reservoir Refill [phone]). No rate change.  Indications: 1. Failed back surgical syndrome   2. Chronic low back pain (Bilateral) w/ sciatica (Bilateral)   3. Chronic lower extremity pain (2ry area of Pain) (Bilateral) (L>R)   4. Chronic lower extremity radicular pain   5. DDD (degenerative disc disease), lumbosacral   6. Lumbar facet syndrome (Bilateral) (R>L)   7. Lumbosacral spondylosis with radiculopathy   8. Weakness of back   9. Weakness of lower extremities (Bilateral)   10. Epidural fibrosis   11. Chronic pain syndrome   12. Physical tolerance to opiate drug   13. Presence of intrathecal pump (Medtronics)   14. Pharmacologic therapy   15. Chronic use of opiate for therapeutic purpose   16. Encounter for adjustment or management of infusion pump   17. Encounter for medication management   18. Encounter for chronic pain management   19. Class 3 severe obesity due to excess calories with serious comorbidity and body mass index (BMI) of 50.0 to 59.9 in adult [facility])     Pain Assessment: Self-Reported Pain Score:  /10             Reported level is compatible with observation.          Intrathecal Drug Delivery System (IDDS)  Pump Device:  Manufacturer: Medtronic Model: Synchromed II Model No.: [id number] Serial No.: [id number] H Delivery Route: Intrathecal Type: Programmable  Volume (mL): 40 mL reservoir Priming Volume: n/a  Calibration Constant: 117.0  MRI compatibility: Conditional   Implant Details:  Date: 2009 Implanter: Glenna Fellows, MD (implant done at [facility]) Contact Information: n/a Last Revision/Replacement: n/a Estimated Replacement Date: Apr/2023  Implant Site: Abdominal Laterality: Left  Catheter: Manufacturer: Medtronic Model:  n/a  Model No.: n/a  Serial No.: n/a  Implanted Length (cm): n/a  Catheter Volume (mL): 0.145  Tip Location (Level): T7 Canal Access Site: L1-2 (R)  Drug content:  Primary Medication Class: Opioid  Medication: PF-Fentanyl  Concentration: 2,000.0 mcg/mL   Secondary Medication Class: Local Anesthetic  Medication: PF-Bupivacaine  Concentration: 30 mg/mL   Tertiary Medication Class: Antispasmodic  Medication: PF-Baclofen  Concentration: 200 mcg/mL   Fourth Medication Class: none   PA parameters (PCA-mode):  Mode: Off (Inactive)  Programming:  Type: Simple continuous.  Medication, Concentration, Infusion Program, & Delivery Rate: For up-to-date details please see most recent scanned programming printout.     Changes:  Medication Change: None at this point Rate Change: No change in rate  Reported side-effects or adverse reactions: None reported  Effectiveness: Described as relatively effective, allowing for increase in activities of daily living (ADL) Clinically meaningful improvement in function (CMIF): Sustained CMIF goals met  Plan: Pump refill today   Pharmacotherapy Assessment   Opioid Analgesic:  Extended release morphine 30 mg tablet, 1 tab p.o. twice daily (60  MME) MME/day: 60 mg/day (from oral medication) + intrathecal fentanyl.   Monitoring: [place] PMP: PDMP reviewed during this encounter.       Pharmacotherapy: No side-effects or adverse reactions reported. Compliance: No problems identified. Effectiveness: Clinically acceptable. Plan: Refer to "POC". UDS:  Summary  Date Value Ref Range Status  09/19/2021 Note  Final    Comment:    ==================================================================== [facility] (MW) ==================================================================== Test                             Result       Flag       Units  Drug Present and Declared for Prescription Verification   Morphine                       1833         EXPECTED   ng/mg creat   Normorphine                    100          EXPECTED   ng/mg creat    Potential sources of large amounts of morphine in the absence of    codeine include administration of morphine or use of heroin.     Normorphine is an expected metabolite of morphine.    Fentanyl                       38           EXPECTED   ng/mg creat   Norfentanyl                    95           EXPECTED   ng/mg creat    Source of fentanyl is a scheduled prescription medication, including    IV, patch, and transmucosal formulations. Norfentanyl is an expected    metabolite of fentanyl.  ==================================================================== Test                      Result    Flag   Units      Ref Range   Creatinine              110              mg/dL      >=20 ==================================================================== Declared Medications:  The flagging and interpretation on this report are based on the  following declared medications.  Unexpected results may arise from  inaccuracies in the declared medications.   **Note: The testing scope of this panel includes these medications:   Fentanyl  Morphine (MS Contin)   **Note: The testing scope of this panel  does not include the  following reported medications:   Acetaminophen (Tylenol)  Aspirin  Baclofen  Calcium  Cyclobenzaprine (Flexeril)  Duloxetine (Cymbalta)  Escitalopram (Lexapro)  Esomeprazole (Nexium)  Gabapentin (Neurontin)  Ibuprofen (Advil)  Metolazone (Zaroxolyn)  Naloxone (Narcan)  Potassium Chloride  Pregabalin (Lyrica)  Promethazine (Phenergan)  Rosuvastatin (Crestor)  Tizanidine (Zanaflex)  Torsemide (Demadex)  Vitamin D3 ==================================================================== For clinical consultation, please call [phone]. ====================================================================    No results found for: "CBDTHCR", "D8THCCBX", "D9THCCBX"   Pre-op H&P Assessment:  Mr. Kumpf is a 56 y.o. (year old), male patient, seen today for interventional treatment. He  has a past surgical history that includes Back surgery; morphine pump; Tonsillectomy; Joint replacement; Shoulder arthroscopy with subacromial decompression, rotator cuff repair and bicep tendon repair (Right, 08/18/2013); Shoulder arthroscopy with subacromial decompression and bicep tendon repair (Left, 10/06/2013); Laparoscopic gastric sleeve resection (N/A, 01/08/2015); and RIGHT HEART CATH (N/A, 09/22/2019). Mr. Searcy has a current medication list which includes the following prescription(s): acetaminophen, aspirin ec, calcium carbonate, vitamin d3, clotrimazole-betamethasone, cyclobenzaprine, duloxetine, escitalopram, esomeprazole, gabapentin, hydromorphone, ibuprofen, morphine, morphine, morphine, [START ON 12/13/2021] morphine, naloxone, NON FORMULARY, ondansetron, potassium chloride, pregabalin, promethazine, tizanidine, and torsemide. His primarily concern today is the No chief complaint on file.  Initial Vital Signs:  Pulse/HCG Rate:    Temp:   Resp:   BP:   SpO2:    BMI: Estimated body mass index is 47.42 kg/m as calculated from the following:   Height as of 09/19/21: '5\' 11"'   (1.803 m).   Weight as of 09/19/21: 340 lb (154.2 kg).  Risk Assessment: Allergies: Reviewed. He is allergic to tape.  Allergy Precautions: None required Coagulopathies: Reviewed. None identified.  Blood-thinner therapy: None at this time Active Infection(s): Reviewed. None identified. Mr. Comes is afebrile  Site Confirmation: Mr. Meulemans was asked to confirm the procedure and laterality before marking the site Procedure checklist: Completed Consent: Before the procedure and under the influence of no sedative(s), amnesic(s), or anxiolytics, the patient was informed of the treatment options, risks and possible complications. To fulfill our ethical and legal obligations, as recommended by the American Medical Association's Code of Ethics, I have informed the patient of my clinical impression; the nature and purpose of the treatment or procedure; the risks, benefits, and possible complications of the intervention; the alternatives, including doing nothing; the risk(s) and benefit(s) of the alternative treatment(s) or procedure(s); and the risk(s) and benefit(s) of doing nothing.  Mr. Quevedo was provided with information about the general risks and possible complications associated with most interventional procedures. These include, but are not limited to: failure to achieve desired goals, infection, bleeding, organ or nerve damage, allergic reactions, paralysis, and/or death.  In addition, he was informed of those risks and possible complications associated to this particular procedure, which include, but are not limited to: damage to the implant; failure to decrease pain; local, systemic, or serious CNS infections, intraspinal abscess with possible cord compression and paralysis, or life-threatening such as meningitis; bleeding; organ damage; nerve injury or damage with subsequent sensory, motor, and/or autonomic system dysfunction, resulting in transient or permanent pain, numbness, and/or weakness  of one or several areas of the body; allergic reactions, either minor or major life-threatening, such as anaphylactic or anaphylactoid reactions.  Furthermore, Mr. Oriley was informed of those risks and complications associated with the medications. These include, but are not limited to: allergic reactions (i.e.: anaphylactic or anaphylactoid reactions); endorphine suppression; bradycardia and/or hypotension; water retention and/or peripheral vascular relaxation leading to lower extremity edema and possible stasis ulcers; respiratory depression and/or shortness of breath; decreased metabolic rate leading to weight gain; swelling or edema; medication-induced neural toxicity; particulate matter embolism and blood vessel occlusion with resultant organ, and/or nervous system infarction; and/or intrathecal granuloma formation with possible spinal cord compression and permanent paralysis.  Before refilling the pump Mr. Grill was informed that some of the medications used in the devise may not be FDA approved for such use and therefore it constitutes an off-label use of the medications.  Finally, he was informed that Medicine is not an exact science; therefore, there is also the possibility of  unforeseen or unpredictable risks and/or possible complications that may result in a catastrophic outcome. The patient indicated having understood very clearly. We have given the patient no guarantees and we have made no promises. Enough time was given to the patient to ask questions, all of which were answered to the patient's satisfaction. Mr. Kunath has indicated that he wanted to continue with the procedure. Attestation: I, the ordering provider, attest that I have discussed with the patient the benefits, risks, side-effects, alternatives, likelihood of achieving goals, and potential problems during recovery for the procedure that I have provided informed consent. Date  Time: {CHL [facility]-PAIN TIME  CHOICES:[id number]}  Pre-Procedure Preparation:  Monitoring: As per clinic protocol. Respiration, ETCO2, SpO2, BP, heart rate and rhythm monitor placed and checked for adequate function Safety Precautions: Patient was assessed for positional comfort and pressure points before starting the procedure. Time-out: I initiated and conducted the "Time-out" before starting the procedure, as per protocol. The patient was asked to participate by confirming the accuracy of the "Time Out" information. Verification of the correct person, site, and procedure were performed and confirmed by me, the nursing staff, and the patient. "Time-out" conducted as per Joint Commission's Universal Protocol (UP.01.01.01). Time:    Description of Procedure:          Position: Supine Target Area: Central-port of intrathecal pump. Approach: Anterior, 90 degree angle approach. Area Prepped: Entire Area around the pump implant. DuraPrep (Iodine Povacrylex [0.7% available iodine] and Isopropyl Alcohol, 74% w/w) Safety Precautions: Aspiration looking for blood return was conducted prior to all injections. At no point did we inject any substances, as a needle was being advanced. No attempts were made at seeking any paresthesias. Safe injection practices and needle disposal techniques used. Medications properly checked for expiration dates. SDV (single dose vial) medications used. Description of the Procedure: Protocol guidelines were followed. Two nurses trained to do implant refills were present during the entire procedure. The refill medication was checked by both healthcare providers as well as the patient. The patient was included in the "Time-out" to verify the medication. The patient was placed in position. The pump was identified. The area was prepped in the usual manner. The sterile template was positioned over the pump, making sure the side-port location matched that of the pump. Both, the pump and the template were held for  stability. The needle provided in the Medtronic Kit was then introduced thru the center of the template and into the central port. The pump content was aspirated and discarded volume documented. The new medication was slowly infused into the pump, thru the filter, making sure to avoid overpressure of the device. The needle was then removed and the area cleansed, making sure to leave some of the prepping solution back to take advantage of its long term bactericidal properties. The pump was interrogated and programmed to reflect the correct medication, volume, and dosage. The program was printed and taken to the physician for approval. Once checked and signed by the physician, a copy was provided to the patient and another scanned into the EMR.  There were no vitals filed for this visit.  Start Time:   hrs. End Time:   hrs. Materials & Medications: Medtronic Refill Kit Medication(s): Please see chart orders for details.  Imaging Guidance:          Type of Imaging Technique: None used Indication(s): N/A Exposure Time: No patient exposure Contrast: None used. Fluoroscopic Guidance: N/A Ultrasound Guidance: N/A Interpretation: N/A  Antibiotic Prophylaxis:  Anti-infectives (From admission, onward)    None      Indication(s): None identified  Post-operative Assessment:  Post-procedure Vital Signs:  Pulse/HCG Rate:    Temp:   Resp:   BP:   SpO2:    EBL: None  Complications: No immediate post-treatment complications observed by team, or reported by patient.  Note: The patient tolerated the entire procedure well. A repeat set of vitals were taken after the procedure and the patient was kept under observation following institutional policy, for this type of procedure. Post-procedural neurological assessment was performed, showing return to baseline, prior to discharge. The patient was provided with post-procedure discharge instructions, including a section on how to identify potential  problems. Should any problems arise concerning this procedure, the patient was given instructions to immediately contact us, at any time, without hesitation. In any case, we plan to contact the patient by telephone for a follow-up status report regarding this interventional procedure.  Comments:  No additional relevant information.  Plan of Care  Orders:  No orders of the defined types were placed in this encounter.  Chronic Opioid Analgesic:  Extended release morphine 30 mg tablet, 1 tab p.o. twice daily (60 MME) MME/day: 60 mg/day (from oral medication) + intrathecal fentanyl.   Medications ordered for procedure: No orders of the defined types were placed in this encounter.  Medications administered: Jeneen Rinks B. Diop had no medications administered during this visit.  See the medical record for exact dosing, route, and time of administration.  Follow-up plan:   No follow-ups on file.       Interventional Therapies  Risk  Complexity Considerations:   Estimated body mass index is 41.84 kg/m as calculated from the following:   Height as of this encounter: '5\' 11"'  (1.803 m).   Weight as of this encounter: 300 lb (136.1 kg).    NOTE: NO RFA until BMI <35  Severe morbid obesity with BMI > 45  Obesity associated medical problems: Type 2 diabetes; obstructive sleep apnea; gastroesophageal reflux disease; congestive heart failure; osteoarthritis affecting multiple joints; knee, hip, and lumbar spine osteoarthritis.   Planned  Pending:   Diagnostic intrathecal catheter evaluation under fluoroscopic guidance  Palliative intrathecal pump refill and maintenance    Under consideration:   Diagnostic left Genicular NB  Diagnostic caudal ESI + epidurogram  Possible Racz procedure  Diagnostic bilateral IA shoulder joint injection  Diagnostic bilateral suprascapular NB    Completed:   Palliative intrathecal pump refill and management  Diagnostic bilateral lumbar facet MBB x1  (12/24/2017) (90/90/10/0-25)  Diagnostic intrathecal catheter test x1 (09/19/2021) (results: No free flow of CSF suggesting intrathecal catheter malfunction)    Therapeutic  Palliative (PRN) options:   Palliative/therapeutic intrathecal pump refill and maintenance  Diagnostic bilateral lumbar facet MBB #2      Recent Visits Date Type Provider Dept  09/19/21 Procedure visit Milinda Pointer, MD [facility]  Showing recent visits within past 90 days and meeting all other requirements Future Appointments Date Type Provider Dept  11/21/21 Appointment Milinda Pointer, MD [facility]  Showing future appointments within next 90 days and meeting all other requirements  Disposition: Discharge home  Discharge (Date  Time): 11/21/2021;   hrs.   Primary Care Physician: Antony Contras, MD Location: [facility] Note by: Gaspar Cola, MD Date: 11/21/2021; Time: 7:32 AM  Disclaimer:  Medicine is not an Chief Strategy Officer. The only guarantee in medicine is that nothing is guaranteed. It is important to note that  the decision to proceed with this intervention was based on the information collected from the patient. The Data and conclusions were drawn from the patient's questionnaire, the interview, and the physical examination. Because the information was provided in large part by the patient, it cannot be guaranteed that it has not been purposely or unconsciously manipulated. Every effort has been made to obtain as much relevant data as possible for this evaluation. It is important to note that the conclusions that lead to this procedure are derived in large part from the available data. Always take into account that the treatment will also be dependent on availability of resources and existing treatment guidelines, considered by other Pain Management Practitioners as being common knowledge and practice, at the time of the intervention. For Medico-Legal  purposes, it is also important to point out that variation in procedural techniques and pharmacological choices are the acceptable norm. The indications, contraindications, technique, and results of the above procedure should only be interpreted and judged by a Board-Certified Interventional Pain Specialist with extensive familiarity and expertise in the same exact procedure and technique.

## 2021-11-21 ENCOUNTER — Ambulatory Visit (HOSPITAL_BASED_OUTPATIENT_CLINIC_OR_DEPARTMENT_OTHER): Payer: No Typology Code available for payment source | Admitting: Pain Medicine

## 2021-11-21 ENCOUNTER — Ambulatory Visit
Admission: RE | Admit: 2021-11-21 | Discharge: 2021-11-21 | Disposition: A | Discharge status: Home / Self Care | Payer: No Typology Code available for payment source | Source: Ambulatory Visit | Attending: Pain Medicine | Admitting: Pain Medicine

## 2021-11-21 ENCOUNTER — Encounter: Payer: Self-pay | Admitting: Pain Medicine

## 2021-11-21 ENCOUNTER — Encounter: Payer: Self-pay | Admitting: Internal Medicine

## 2021-11-21 VITALS — BP 150/119 | HR 115 | Temp 97.4°F | Ht 71.0 in | Wt 340.0 lb

## 2021-11-21 DIAGNOSIS — E66813 Obesity, class 3: Secondary | ICD-10-CM

## 2021-11-21 DIAGNOSIS — R29898 Other symptoms and signs involving the musculoskeletal system: Secondary | ICD-10-CM | POA: Diagnosis present

## 2021-11-21 DIAGNOSIS — M5137 Other intervertebral disc degeneration, lumbosacral region: Secondary | ICD-10-CM | POA: Insufficient documentation

## 2021-11-21 DIAGNOSIS — M4726 Other spondylosis with radiculopathy, lumbar region: Secondary | ICD-10-CM | POA: Insufficient documentation

## 2021-11-21 DIAGNOSIS — Z978 Presence of other specified devices: Secondary | ICD-10-CM

## 2021-11-21 DIAGNOSIS — Z6841 Body Mass Index (BMI) 40.0 and over, adult: Secondary | ICD-10-CM

## 2021-11-21 DIAGNOSIS — M51379 Other intervertebral disc degeneration, lumbosacral region without mention of lumbar back pain or lower extremity pain: Secondary | ICD-10-CM

## 2021-11-21 DIAGNOSIS — M47816 Spondylosis without myelopathy or radiculopathy, lumbar region: Secondary | ICD-10-CM | POA: Diagnosis present

## 2021-11-21 DIAGNOSIS — F1190 Opioid use, unspecified, uncomplicated: Secondary | ICD-10-CM

## 2021-11-21 DIAGNOSIS — M5442 Lumbago with sciatica, left side: Secondary | ICD-10-CM | POA: Diagnosis not present

## 2021-11-21 DIAGNOSIS — M5410 Radiculopathy, site unspecified: Secondary | ICD-10-CM | POA: Diagnosis not present

## 2021-11-21 DIAGNOSIS — M79606 Pain in leg, unspecified: Secondary | ICD-10-CM | POA: Insufficient documentation

## 2021-11-21 DIAGNOSIS — M5441 Lumbago with sciatica, right side: Secondary | ICD-10-CM | POA: Diagnosis not present

## 2021-11-21 DIAGNOSIS — G8929 Other chronic pain: Secondary | ICD-10-CM

## 2021-11-21 DIAGNOSIS — G96198 Other disorders of meninges, not elsewhere classified: Secondary | ICD-10-CM

## 2021-11-21 DIAGNOSIS — Z1379 Encounter for other screening for genetic and chromosomal anomalies: Secondary | ICD-10-CM

## 2021-11-21 DIAGNOSIS — R531 Weakness: Secondary | ICD-10-CM | POA: Insufficient documentation

## 2021-11-21 DIAGNOSIS — M545 Low back pain, unspecified: Secondary | ICD-10-CM | POA: Insufficient documentation

## 2021-11-21 DIAGNOSIS — M79605 Pain in left leg: Secondary | ICD-10-CM | POA: Insufficient documentation

## 2021-11-21 DIAGNOSIS — G894 Chronic pain syndrome: Secondary | ICD-10-CM | POA: Insufficient documentation

## 2021-11-21 DIAGNOSIS — Z79891 Long term (current) use of opiate analgesic: Secondary | ICD-10-CM

## 2021-11-21 DIAGNOSIS — M4727 Other spondylosis with radiculopathy, lumbosacral region: Secondary | ICD-10-CM

## 2021-11-21 DIAGNOSIS — M79604 Pain in right leg: Secondary | ICD-10-CM

## 2021-11-21 DIAGNOSIS — R937 Abnormal findings on diagnostic imaging of other parts of musculoskeletal system: Secondary | ICD-10-CM

## 2021-11-21 DIAGNOSIS — M5117 Intervertebral disc disorders with radiculopathy, lumbosacral region: Secondary | ICD-10-CM | POA: Insufficient documentation

## 2021-11-21 DIAGNOSIS — M961 Postlaminectomy syndrome, not elsewhere classified: Secondary | ICD-10-CM

## 2021-11-21 DIAGNOSIS — Z451 Encounter for adjustment and management of infusion pump: Secondary | ICD-10-CM

## 2021-11-21 DIAGNOSIS — Z79899 Other long term (current) drug therapy: Secondary | ICD-10-CM

## 2021-11-21 MED ORDER — HYDROMORPHONE HCL 4 MG PO TABS: 4.0000 mg | ORAL_TABLET | Freq: Four times a day (QID) | ORAL | 0 refills | Status: DC | PRN

## 2021-11-21 MED ORDER — NALOXONE HCL 4 MG/0.1ML NA LIQD: 1.0000 | NASAL | 0 refills | Status: AC | PRN

## 2021-11-21 NOTE — Progress Notes (Signed)
1255 Attempted to  interrogate pump BY DW. Computer stated that pump not useable. MD notified and would like for old medication to be removed. Patient is going for preop 8/15  surgery 8/24.  Patient just found out surgery date this week.

## 2021-11-21 NOTE — Progress Notes (Signed)
Nursing Pain Medication Assessment:  Safety precautions to be maintained throughout the outpatient stay will include: orient to surroundings, keep bed in low position, maintain call bell within reach at all times, provide assistance with transfer out of bed and ambulation.  Medication Inspection Compliance: Pill count conducted under aseptic conditions, in front of the patient. Neither the pills nor the bottle was removed from the patient's sight at any time. Once count was completed pills were immediately returned to the patient in their original bottle.  Medication: Morphine IR Pill/Patch Count:  45 of 60 pills remain Pill/Patch Appearance: Markings consistent with prescribed medication Bottle Appearance: Standard pharmacy container. Clearly labeled. Filled Date: 06 / 02 / 2023 Last Medication intake:  Today

## 2021-11-21 NOTE — Progress Notes (Deleted)
Safety precautions to be maintained throughout the outpatient stay will include: orient to surroundings, keep bed in low position, maintain call bell within reach at all times, provide assistance with transfer out of bed and ambulation.  

## 2021-11-21 NOTE — Progress Notes (Signed)
Safety precautions to be maintained throughout the outpatient stay will include: orient to surroundings, keep bed in low position, maintain call bell within reach at all times, provide assistance with transfer out of bed and ambulation.  

## 2021-12-17 ENCOUNTER — Telehealth: Payer: Self-pay

## 2021-12-17 ENCOUNTER — Other Ambulatory Visit: Payer: Self-pay | Admitting: Pain Medicine

## 2021-12-17 DIAGNOSIS — Z1379 Encounter for other screening for genetic and chromosomal anomalies: Secondary | ICD-10-CM

## 2021-12-17 DIAGNOSIS — Z462 Encounter for fitting and adjustment of other devices related to nervous system and special senses: Secondary | ICD-10-CM

## 2021-12-17 DIAGNOSIS — M961 Postlaminectomy syndrome, not elsewhere classified: Secondary | ICD-10-CM

## 2021-12-17 DIAGNOSIS — G8929 Other chronic pain: Secondary | ICD-10-CM

## 2021-12-17 NOTE — Telephone Encounter (Signed)
I got an email from his workers Designer, industrial/product. The patient said you talked about having an aid come in 3-4  a day for him. She said she will need an order from you in order to get this done. Can you put in an order? Thanks

## 2021-12-17 NOTE — Progress Notes (Signed)
Replacement of pump and catheter canceled by neurosurgeon (Dr. Lucy Chris) secondary to increased surgical risks.  As of 11/21/2021 the patient was still at 340 pounds and a BMI of 47.4 kg/m and one-point on 01/08/2015 the patient was 406 pounds with a BMI of 56.64 kg/m.  He needs to be at no more than 215 pounds in order to reach a BMI of 30 kg/m.  Ideal body weight for him at 5 feet 11 inches tall should be 175 pounds.  Currently he is 165 pounds away from ideal body weight and 125 pounds away from a BMI of 30 kg/m.  To me there is absolutely no question that this is adversely affecting his chronic pain condition and our ability to get this under control.

## 2021-12-18 ENCOUNTER — Other Ambulatory Visit: Payer: Self-pay | Admitting: Pain Medicine

## 2021-12-18 DIAGNOSIS — R937 Abnormal findings on diagnostic imaging of other parts of musculoskeletal system: Secondary | ICD-10-CM

## 2021-12-18 DIAGNOSIS — Z978 Presence of other specified devices: Secondary | ICD-10-CM

## 2021-12-18 DIAGNOSIS — G96198 Other disorders of meninges, not elsewhere classified: Secondary | ICD-10-CM

## 2021-12-18 DIAGNOSIS — T85620S Displacement of epidural and subdural infusion catheter, sequela: Secondary | ICD-10-CM

## 2021-12-18 DIAGNOSIS — G8929 Other chronic pain: Secondary | ICD-10-CM

## 2021-12-18 DIAGNOSIS — M961 Postlaminectomy syndrome, not elsewhere classified: Secondary | ICD-10-CM

## 2021-12-18 DIAGNOSIS — Z462 Encounter for fitting and adjustment of other devices related to nervous system and special senses: Secondary | ICD-10-CM

## 2021-12-18 DIAGNOSIS — M5137 Other intervertebral disc degeneration, lumbosacral region: Secondary | ICD-10-CM

## 2021-12-18 NOTE — Progress Notes (Signed)
Referral to Diamantina Providence, MD ([place]urosur[place] in [place] [address] [place], [place] [address].  [phone].  "[facility]".  The patient has a Medtronic pump that has reached end of battery life.  Recent testing of intrathecal catheter secondary to increased pain proved to catheter to be nonfunctional with no CSF return.  The patient has been sent for pump and catheter replacement.

## 2021-12-19 ENCOUNTER — Telehealth: Payer: Self-pay | Admitting: Pain Medicine

## 2021-12-19 NOTE — Telephone Encounter (Signed)
PT wants to know if he can get an approve to get his meds a day early. Due to the pharmacy being close on Saturday and Sunday. PT stated that it has been approve for the 9th however pharmacy will be close. Please give patient a call. Thanks

## 2021-12-19 NOTE — Telephone Encounter (Signed)
Patient informed that it states on the prescription the script may be filled a day early in case of pharmacy closure.

## 2021-12-23 ENCOUNTER — Other Ambulatory Visit: Payer: Self-pay | Admitting: Pain Medicine

## 2021-12-23 DIAGNOSIS — Z79899 Other long term (current) drug therapy: Secondary | ICD-10-CM

## 2021-12-23 DIAGNOSIS — Z79891 Long term (current) use of opiate analgesic: Secondary | ICD-10-CM

## 2021-12-23 DIAGNOSIS — M47816 Spondylosis without myelopathy or radiculopathy, lumbar region: Secondary | ICD-10-CM

## 2021-12-23 DIAGNOSIS — M5137 Other intervertebral disc degeneration, lumbosacral region: Secondary | ICD-10-CM

## 2021-12-23 DIAGNOSIS — G8929 Other chronic pain: Secondary | ICD-10-CM

## 2021-12-23 DIAGNOSIS — F1190 Opioid use, unspecified, uncomplicated: Secondary | ICD-10-CM

## 2021-12-23 DIAGNOSIS — M961 Postlaminectomy syndrome, not elsewhere classified: Secondary | ICD-10-CM

## 2021-12-23 DIAGNOSIS — G894 Chronic pain syndrome: Secondary | ICD-10-CM

## 2021-12-23 DIAGNOSIS — Z978 Presence of other specified devices: Secondary | ICD-10-CM

## 2021-12-30 ENCOUNTER — Telehealth: Payer: Self-pay | Admitting: Pain Medicine

## 2021-12-30 ENCOUNTER — Other Ambulatory Visit: Payer: Self-pay | Admitting: Pain Medicine

## 2021-12-30 DIAGNOSIS — M961 Postlaminectomy syndrome, not elsewhere classified: Secondary | ICD-10-CM

## 2021-12-30 DIAGNOSIS — G894 Chronic pain syndrome: Secondary | ICD-10-CM

## 2021-12-30 DIAGNOSIS — G8929 Other chronic pain: Secondary | ICD-10-CM

## 2021-12-30 DIAGNOSIS — M47816 Spondylosis without myelopathy or radiculopathy, lumbar region: Secondary | ICD-10-CM

## 2021-12-30 DIAGNOSIS — M5137 Other intervertebral disc degeneration, lumbosacral region: Secondary | ICD-10-CM

## 2021-12-30 DIAGNOSIS — Z79899 Other long term (current) drug therapy: Secondary | ICD-10-CM

## 2021-12-30 DIAGNOSIS — F1190 Opioid use, unspecified, uncomplicated: Secondary | ICD-10-CM

## 2021-12-30 DIAGNOSIS — Z79891 Long term (current) use of opiate analgesic: Secondary | ICD-10-CM

## 2021-12-30 DIAGNOSIS — Z978 Presence of other specified devices: Secondary | ICD-10-CM

## 2021-12-30 NOTE — Telephone Encounter (Signed)
Patient went to fill script for Dilaudid, the pharmacy only had 38 and he picked it up. He is asking if a script for the remainder of the  meds can be sent in. Please advise pateint

## 2021-12-30 NOTE — Telephone Encounter (Signed)
Returned patient phone call.  Told him that we do not send in another fill when getting a short fill.  Patient states he was unaware of this.  I told him that pharmacy knows that they are not supposed to short fill and ask if they  can honor the rest of Rx.

## 2021-12-31 ENCOUNTER — Telehealth: Payer: Self-pay | Admitting: Pain Medicine

## 2021-12-31 NOTE — Telephone Encounter (Signed)
Pharmacy is calling about refill on Hydromorphone. They were unable to fill complete script and wanted to see if Dr. Lynnda Child send a script for the remaining medication. They would like to speak with some.

## 2022-01-02 NOTE — Progress Notes (Signed)
Patient: [name]  Service Category: Precharting review  Provider: Gaspar Cola, MD  DOB: 03/04/1966  DOS: 01/06/2022  Referring Provider: Antony Contras, MD  MRN: [id number]  Specialty: Interventional Pain Management  PCP: Antony Contras, MD  Type: Established Patient  Setting: Ambulatory outpatient     NO-SHOW to appointment 01/06/2022.  Routine UDS pending.   Status of referral to Atilano Ina, MD (neurosurgeon) pending evaluation for replacement of intrathecal pump and catheter.  Plans for replacement surgery canceled by Dr. Deetta Perla after completing his evaluation.  Genetic testing of cytochrome P450 2D6 came back indicating normal activity.  This confirms the patient not to have a variant responsible for increased opioid metabolism.  Therefore, patient is in fact having problems with opioid tolerance.  Recent lumbar MRI done on 11/21/2021 does not seem to support/justify patient's level pain or opioid dose. Pending "Drug Holidays".  The patient allowed for a "partial fill" on 12/20/2021.  Nonopioid transfered 01/24/2020: Zanaflex, Flexeril, Nexium, Lyrica, Amitiza, calcium, and vitamin D3.  Pharmacotherapy Assessment  Analgesic: Morphine ER (MSContin) 30 mg tablet, 1 tab p.o. twice daily (60 mg/day of morphine) (60 MME) + hydromorphone (Dilaudid) 4 mg tablet, 1 tab p.o. every 6 hours (16 mg/day of hydromorphone) (80 MME) (140 MME total) MME/day: 140 mg/day (from oral medication) + intrathecal fentanyl.   Monitoring: [place] PMP: PDMP reviewed during this encounter.       Pharmacotherapy: No side-effects or adverse reactions reported. Compliance: No problems identified. Effectiveness: Clinically acceptable.  No notes on file  No results found for: "CBDTHCR" No results found for: "D8THCCBX" No results found for: "D9THCCBX"  UDS:  Summary  Date Value Ref Range Status  09/19/2021 Note  Final    Comment:     ==================================================================== [employer] [facility] 13 (MW) ==================================================================== Test                             Result       Flag       Units  Drug Present and Declared for Prescription Verification   Morphine                       1833         EXPECTED   ng/mg creat   Normorphine                    100          EXPECTED   ng/mg creat    Potential sources of large amounts of morphine in the absence of    codeine include administration of morphine or use of heroin.     Normorphine is an expected metabolite of morphine.    Fentanyl                       38           EXPECTED   ng/mg creat   Norfentanyl                    95           EXPECTED   ng/mg creat    Source of fentanyl is a scheduled prescription medication, including    IV, patch, and transmucosal formulations. Norfentanyl is an expected    metabolite of fentanyl.  ==================================================================== Test  Result    Flag   Units      Ref Range   Creatinine              110              mg/dL      >=20 ==================================================================== Declared Medications:  The flagging and interpretation on this report are based on the  following declared medications.  Unexpected results may arise from  inaccuracies in the declared medications.   **Note: The testing scope of this panel includes these medications:   Fentanyl  Morphine (MS Contin)   **Note: The testing scope of this panel does not include the  following reported medications:   Acetaminophen (Tylenol)  Aspirin  Baclofen  Calcium  Cyclobenzaprine (Flexeril)  Duloxetine (Cymbalta)  Escitalopram (Lexapro)  Esomeprazole (Nexium)  Gabapentin (Neurontin)  Ibuprofen (Advil)  Metolazone (Zaroxolyn)  Naloxone (Narcan)  Potassium Chloride  Pregabalin (Lyrica)  Promethazine (Phenergan)  Rosuvastatin  (Crestor)  Tizanidine (Zanaflex)  Torsemide (Demadex)  Vitamin D3 ==================================================================== For clinical consultation, please call [phone]. ====================================================================       Medication Review  DULoxetine, HYDROmorphone, NON FORMULARY, Vitamin D3, acetaminophen, aspirin EC, calcium carbonate, clotrimazole-betamethasone, cyclobenzaprine, escitalopram, esomeprazole, gabapentin, ibuprofen, morphine, naloxone, ondansetron, promethazine, tiZANidine, and torsemide   Laboratory Chemistry Profile   Renal Lab Results  Component Value Date   BUN 9 10/31/2021   CREATININE 0.61 10/31/2021   LABCREA 115.35 06/11/2018   BCR 13 11/17/2017   GFR 96.99 10/20/2014   GFRAA >60 12/29/2019   GFRNONAA >60 10/31/2021    Hepatic Lab Results  Component Value Date   AST 20 10/31/2021   ALT 18 10/31/2021   ALBUMIN 3.2 (L) 10/31/2021   ALKPHOS 73 10/31/2021   LIPASE 26 10/31/2021   AMMONIA 50 (H) 07/11/2018    Electrolytes Lab Results  Component Value Date   NA 138 10/31/2021   K 4.0 10/31/2021   CL 99 10/31/2021   CALCIUM 8.8 (L) 10/31/2021   MG 2.1 07/11/2018   PHOS 2.3 (L) 07/11/2018    Bone Lab Results  Component Value Date   25OHVITD1 6.3 (L) 06/30/2018   25OHVITD2 <1.0 06/30/2018   25OHVITD3 6.1 06/30/2018   TESTOFREE 3.1 (L) 06/30/2018   TESTOSTERONE 22 (L) 06/30/2018    Inflammation (CRP: Acute Phase) (ESR: Chronic Phase) Lab Results  Component Value Date   CRP 1.8 (H) 07/11/2018   ESRSEDRATE 41 (H) 11/17/2017   LATICACIDVEN 2.2 ([place]) 07/11/2018         Note: Above Lab results reviewed.  Recent Imaging Review  MR LUMBAR SPINE WO CONTRAST CLINICAL DATA:  Low back pain.  EXAM: MRI LUMBAR SPINE WITHOUT CONTRAST  TECHNIQUE: Multiplanar, multisequence MR imaging of the lumbar spine was performed. No intravenous contrast was administered.  COMPARISON:  CT abdomen pelvis dated  June 10, 2018.  FINDINGS: Segmentation:  Standard.  Alignment: Mild straightening of the normal lumbar lordosis. No significant listhesis.  Vertebrae:  No fracture, evidence of discitis, or bone lesion.  Conus medullaris and cauda equina: Conus extends to the T12-L1 level. Conus and cauda equina appear normal.  Paraspinal and other soft tissues: Negative.  Disc levels:  T12-L1:  Negative.  L1-L2:  Negative.  L2-L3: Prior right hemilaminectomy. Mild circumferential disc bulging and endplate spurring with superimposed small central posterior disc osteophyte complex. Mild bilateral facet arthropathy. Mild left neuroforaminal stenosis. No spinal canal or right neuroforaminal stenosis.  L3-L4: Prior bilateral laminectomies and interbody fusion. No residual stenosis.  L4-L5: Prior bilateral  laminectomies and interbody fusion. No residual stenosis.  L5-S1: Bulky right foraminal disc osteophyte complex and moderate bilateral facet arthropathy. Mild-to-moderate right neuroforaminal stenosis. No spinal canal or left neuroforaminal stenosis.  IMPRESSION: 1. Prior L3-L5 posterior decompression and interbody fusion without residual stenosis. 2. Unchanged mild to moderate right neuroforaminal stenosis at L5-S1 due to bulky right foraminal disc osteophyte complex and moderate facet arthropathy.  Electronically Signed   By: Titus Dubin M.D.   On: 11/21/2021 10:54 Note: Reviewed          Interventional Therapies  Risk  Complexity Considerations:   Estimated body mass index is 41.84 kg/m as calculated from the following:   Height as of this encounter: '5\' 11"'  (1.803 m).   Weight as of this encounter: 300 lb (136.1 kg).    NOTE: NO RFA until BMI <35  Severe morbid obesity with BMI > 45  Obesity associated medical problems: Type 2 diabetes; obstructive sleep apnea; gastroesophageal reflux disease; congestive heart failure; osteoarthritis affecting multiple joints;  knee, hip, and lumbar spine osteoarthritis. Genetic testing of cytochrome P450 2D6 came back indicating normal activity.  (05/14/2021)    Planned  Pending:   Diagnostic intrathecal catheter evaluation under fluoroscopic guidance  Palliative intrathecal pump refill and maintenance    Under consideration:   Diagnostic left Genicular NB  Diagnostic caudal ESI + epidurogram  Possible Racz procedure  Diagnostic bilateral IA shoulder joint injection  Diagnostic bilateral suprascapular NB    Completed:   Palliative intrathecal pump refill and management  Diagnostic bilateral lumbar facet MBB x1 (12/24/2017) (90/90/10/0-25)  Diagnostic intrathecal catheter test x1 (09/19/2021) (results: No free flow of CSF suggesting intrathecal catheter malfunction)    Therapeutic  Palliative (PRN) options:   Palliative/therapeutic intrathecal pump refill and maintenance  Diagnostic bilateral lumbar facet MBB #2     Note by: Gaspar Cola, MD Date: 01/06/2022; Time: 11:23 AM

## 2022-01-06 ENCOUNTER — Ambulatory Visit: Payer: Medicare HMO | Admitting: Pain Medicine

## 2022-01-06 DIAGNOSIS — F1190 Opioid use, unspecified, uncomplicated: Secondary | ICD-10-CM | POA: Insufficient documentation

## 2022-01-06 NOTE — Patient Instructions (Signed)
____________________________________________________________________________________________  Drug Holidays (Slow)  What is a "Drug Holiday"? Drug Holiday: is the name given to the period of time during which a patient stops taking a medication(s) for the purpose of eliminating tolerance to the drug.  Benefits Improved effectiveness of opioids. Decreased opioid dose needed to achieve benefits. Improved pain with lesser dose.  What is tolerance? Tolerance: is the progressive decreased in effectiveness of a drug due to its repetitive use. With repetitive use, the body gets use to the medication and as a consequence, it loses its effectiveness. This is a common problem seen with opioid pain medications. As a result, a larger dose of the drug is needed to achieve the same effect that used to be obtained with a smaller dose.  How long should a "Drug Holiday" last? You should stay off of the pain medicine for at least 14 consecutive days. (2 weeks)  Should I stop the medicine "cold [place]"? No. You should always coordinate with your Pain Specialist so that he/she can provide you with the correct medication dose to make the transition as smoothly as possible.  How do I stop the medicine? Slowly. You will be instructed to decrease the daily amount of pills that you take by one (1) pill every seven (7) days. This is called a "slow downward taper" of your dose. For example: if you normally take four (4) pills per day, you will be asked to drop this dose to three (3) pills per day for seven (7) days, then to two (2) pills per day for seven (7) days, then to one (1) per day for seven (7) days, and at the end of those last seven (7) days, this is when the "Drug Holiday" would start.   Will I have withdrawals? By doing a "slow downward taper" like this one, it is unlikely that you will experience any significant withdrawal symptoms. Typically, what triggers withdrawals is the sudden stop of a high dose  opioid therapy. Withdrawals can usually be avoided by slowly decreasing the dose over a prolonged period of time. If you do not follow these instructions and decide to stop your medication abruptly, withdrawals may be possible.  What are withdrawals? Withdrawals: refers to the wide range of symptoms that occur after stopping or dramatically reducing opiate drugs after heavy and prolonged use. Withdrawal symptoms do not occur to patients that use low dose opioids, or those who take the medication sporadically. Contrary to benzodiazepine (example: Valium, Xanax, etc.) or alcohol withdrawals ("Delirium Tremens"), opioid withdrawals are not lethal. Withdrawals are the physical manifestation of the body getting rid of the excess receptors.  Expected Symptoms Early symptoms of withdrawal may include: Agitation Anxiety Muscle aches Increased tearing Insomnia Runny nose Sweating Yawning  Late symptoms of withdrawal may include: Abdominal cramping Diarrhea Dilated pupils Goose bumps Nausea Vomiting  Will I experience withdrawals? Due to the slow nature of the taper, it is very unlikely that you will experience any.  What is a slow taper? Taper: refers to the gradual decrease in dose.  (Last update: 11/02/2019) ____________________________________________________________________________________________   ____________________________________________________________________________________________  Pharmacy Shortages of Pain Medication   Introduction Shockingly as it may seem, .  "No U.S. Supreme Court decision has ever interpreted the Constitution as guaranteeing a right to health care for all Americans." - OpeningJokes.ch  "With respect to human rights, the [place] has no formally codified right to health, nor does it participate in a human rights treaty that specifies a right to health." - Scott J.  Schweikart,  JD, MBE  Situation By now, most of our patients have had the experience of being told by their pharmacist that they do not have enough medication to cover their prescription. If you have not had this experience, just know that you soon will.  Problem There appears to be a shortage of these medications, either at the national level or locally. This is happening with all pharmacies. When there is not enough medication, patients are offered a partial fill and they are told that they will try to get the rest of the medicine for them at a later time. If they do not have enough for even a partial fill, the pharmacists are telling the patients to call us (the prescribing physicians) to request that we send another prescription to another pharmacy to get the medicine.   This reordering of a controlled substance creates documentation problems where additional paperwork needs to be created to explain why two prescriptions for the same period of time and the same medicine are being prescribed to the same patient. It also creates situations where the last appointment note does not accurately reflect when and what prescriptions were given to a patient. This leads to prescribing errors down the line, in subsequent follow-up visits.   [employer] of Pharmacy ([employer]) Research revealed that Surveyor, quantity .1806 (21 NCAC 46.1806) authorizes pharmacists to the transfer of prescriptions among pharmacies, and it sets forth procedural and recordkeeping requirements for doing so. However, this requires the pharmacist to complete the previously mentioned procedural paperwork to accomplish the transfer. As it turns out, it is much easier for them to have the prescribing physicians do the work.   Possible solutions 1. You can ask your physician to assist you in weaning yourself off these medications. 2. Ask your pharmacy if the medication is in stock, 3 days prior to your refill. 3. If you need a pharmacy  change, let us know at your medication management visit. Prescriptions that have already been electronically sent to a pharmacy will not be re-sent to a different pharmacy if your pharmacy of record does not have it in stock. Proper stocking of medication is a pharmacy problem, not a prescriber problem. Work with your pharmacist to solve the problem. 4. Have the [facility] Assembly add a provision to the "STOP ACT" (the law that mandates how controlled substances are prescribed) where there is an exception to the electronic prescribing rule that states that in the event there are shortages of medications the physicians are allowed to use written prescriptions as opposed to electronic ones. This would allow patients to take their prescriptions to a different pharmacy that may have enough medication available to fill the prescription. The problem is that currently there is a law that does not allow for written prescriptions, with the exception of instances where the electronic medical record is down due to technical issues.  5. Have [place] Congress ease the pressure on pharmaceutical companies, allowing them to produce enough quantities of the medication to adequately supply the population. 6. Have pharmacies keep enough stocks of these medications to cover their client base.  7. Have the [facility] Assembly add a provision to the "STOP ACT" where they ease the regulations surrounding the transfer of controlled substances between pharmacies, so as to simplify the transfer of supplies. As an alternative, develop a system to allow patients to obtain the remainder of their prescription at another one of their pharmacies or at an associate pharmacy.  How this shortage will affect you.  Understand that this is a pharmacy supply problem, not a prescriber problem. Work with your pharmacy to solve it. The job of the prescriber is to evaluate and monitor the patient for the appropriate indications and  use of these medicines. It is not the job of the prescriber to supply the medication or to solve problems with that supply. The responsibility and the choice to obtain the medication resides on the patient. By law, supplying the medication is the job of the pharmacy. It is certainly not the job of the prescriber to solve supply problems.   Due to the above problems we are no longer taking patients to write for their pain medication. Future discussions with your physician may include potentially weaning medications or transitioning to alternatives.  We will be focusing primarily on interventional based pain management. We will continue to evaluate for appropriate indications and we may provide recommendations regarding medication, dose, and schedule, as well as monitoring recommendations, however, we will not be taking over the actual prescribing of these substances. On those patients where we are treating their chronic pain with interventional therapies, exceptions will be considered on a case by case basis. At this time, we will try to continue providing this supplemental service to those patients we have been managing in the past. However, as of August 1st, 2023, we no longer will be sending additional prescriptions to other pharmacies for the purpose of solving their supply problems. Once we send a prescription to a pharmacy, we will not be resending it again to another pharmacy to cover for their shortages.   What to do. Write as many letters as you can. Recruit the help of family members in writing these letters. Below are some of the places where you can write to make your voice heard. Let them know what the problem is and push them to look for solutions.   Search internet for: "[employer] find your legislators" NoseSwap.is  Search internet for: "[employer] commissioner  complaints" Starlas.fi  Search internet for: "[place] complaints" https://www.hernandez-brewer.com/.htm  Search internet for: "[facility] pharmacy complaints" Email [facility] Customer Relations woondaal.com.jsp?callType=store  Search internet for: Programme researcher, broadcasting/film/video customer service complaints" https://www.[facility].com/topic/marketing/contactus/contactus_customerservice.jsp  ____________________________________________________________________________________________  ____________________________________________________________________________________________  Medication Rules  Purpose: To inform patients, and their family members, of our rules and regulations.  Applies to: All patients receiving prescriptions (written or electronic).  Pharmacy of record: Pharmacy where electronic prescriptions will be sent. If written prescriptions are taken to a different pharmacy, please inform the nursing staff. The pharmacy listed in the electronic medical record should be the one where you would like electronic prescriptions to be sent.  Electronic prescriptions: In compliance with the [place] (STOP) Act of 2017 (Session Lanny Cramp [phone]), effective April 14, 2018, all controlled substances must be electronically prescribed. Calling prescriptions to the pharmacy will cease to exist.  Prescription refills: Only during scheduled appointments. Applies to all prescriptions.  NOTE: The following applies primarily to controlled substances (Opioid* Pain Medications).   Type of encounter (visit): For patients receiving controlled substances, face-to-face visits are required. (Not an option or up to the patient.)  Patient's responsibilities: Pain Pills: Bring all pain pills to every appointment (except for procedure appointments). Pill Bottles:  Bring pills in original pharmacy bottle. Always bring the newest bottle. Bring bottle, even if empty. Medication refills: You are responsible for knowing and keeping track of what medications you take and those you  need refilled. The day before your appointment: write a list of all prescriptions that need to be refilled. The day of the appointment: give the list to the admitting nurse. Prescriptions will be written only during appointments. No prescriptions will be written on procedure days. If you forget a medication: it will not be "Called in", "Faxed", or "electronically sent". You will need to get another appointment to get these prescribed. No early refills. Do not call asking to have your prescription filled early. Prescription Accuracy: You are responsible for carefully inspecting your prescriptions before leaving our office. Have the discharge nurse carefully go over each prescription with you, before taking them home. Make sure that your name is accurately spelled, that your address is correct. Check the name and dose of your medication to make sure it is accurate. Check the number of pills, and the written instructions to make sure they are clear and accurate. Make sure that you are given enough medication to last until your next medication refill appointment. Taking Medication: Take medication as prescribed. When it comes to controlled substances, taking less pills or less frequently than prescribed is permitted and encouraged. Never take more pills than instructed. Never take medication more frequently than prescribed.  Inform other Doctors: Always inform, all of your healthcare providers, of all the medications you take. Pain Medication from other Providers: You are not allowed to accept any additional pain medication from any other Doctor or Healthcare provider. There are two exceptions to this rule. (see below) In the event that you require additional pain medication, you are responsible  for notifying us, as stated below. Cough Medicine: Often these contain an opioid, such as codeine or hydrocodone. Never accept or take cough medicine containing these opioids if you are already taking an opioid* medication. The combination may cause respiratory failure and death. Medication Agreement: You are responsible for carefully reading and following our Medication Agreement. This must be signed before receiving any prescriptions from our practice. Safely store a copy of your signed Agreement. Violations to the Agreement will result in no further prescriptions. (Additional copies of our Medication Agreement are available upon request.) Laws, Rules, & Regulations: All patients are expected to follow all Federal and [employer], [employer], Rules, [employer]. Ignorance of the Laws does not constitute a valid excuse.  Illegal drugs and Controlled Substances: The use of illegal substances (including, but not limited to marijuana and its derivatives) and/or the illegal use of any controlled substances is strictly prohibited. Violation of this rule may result in the immediate and permanent discontinuation of any and all prescriptions being written by our practice. The use of any illegal substances is prohibited. Adopted CDC guidelines & recommendations: Target dosing levels will be at or below 60 MME/day. Use of benzodiazepines** is not recommended.  Exceptions: There are only two exceptions to the rule of not receiving pain medications from other Healthcare Providers. Exception #1 (Emergencies): In the event of an emergency (i.e.: accident requiring emergency care), you are allowed to receive additional pain medication. However, you are responsible for: As soon as you are able, call our office [phone], at any time of the day or night, and leave a message stating your name, the date and nature of the emergency, and the name and dose of the medication prescribed. In the event that your call is answered  by a member of our staff, make sure to document and save the date, time, and the name of the person that took your information.  Exception #2 (Planned Surgery): In the event that you are scheduled by another doctor or dentist to have any type of surgery or procedure, you are allowed (for a period no longer than 30 days), to receive additional pain medication, for the acute post-op pain. However, in this case, you are responsible for picking up a copy of our "Post-op Pain Management for Surgeons" handout, and giving it to your surgeon or dentist. This document is available at our office, and does not require an appointment to obtain it. Simply go to our office during business hours (Monday-Thursday from 8:00 AM to 4:00 PM) (Friday 8:00 AM to 12:00 Noon) or if you have a scheduled appointment with [place], prior to your surgery, and ask for it by name. In addition, you are responsible for: calling our office [phone], at any time of the day or night, and leaving a message stating your name, name of your surgeon, type of surgery, and date of procedure or surgery. Failure to comply with your responsibilities may result in termination of therapy involving the controlled substances. Medication Agreement Violation. Following the above rules, including your responsibilities will help you in avoiding a Medication Agreement Violation ("Breaking your Pain Medication Contract").  *Opioid medications include: morphine, codeine, oxycodone, oxymorphone, hydrocodone, hydromorphone, meperidine, tramadol, tapentadol, buprenorphine, fentanyl, methadone. **Benzodiazepine medications include: diazepam (Valium), alprazolam (Xanax), clonazepam (Klonopine), lorazepam (Ativan), clorazepate (Tranxene), chlordiazepoxide (Librium), estazolam (Prosom), oxazepam (Serax), temazepam (Restoril), triazolam (Halcion) (Last updated: 01/09/2021) ____________________________________________________________________________________________   ____________________________________________________________________________________________  Medication Recommendations and Reminders  Applies to: All patients receiving prescriptions (written and/or electronic).  Medication Rules & Regulations: These rules and regulations exist for your safety and that of others. They are not flexible and neither are we. Dismissing or ignoring them will be considered "non-compliance" with medication therapy, resulting in complete and irreversible termination of such therapy. (See document titled "Medication Rules" for more details.) In all conscience, because of safety reasons, we cannot continue providing a therapy where the patient does not follow instructions.  Pharmacy of record:  Definition: This is the pharmacy where your electronic prescriptions will be sent.  We do not endorse any particular pharmacy, however, we have experienced problems with [facility] not securing enough medication supply for the community. We do not restrict you in your choice of pharmacy. However, once we write for your prescriptions, we will NOT be re-sending more prescriptions to fix restricted supply problems created by your pharmacy, or your insurance.  The pharmacy listed in the electronic medical record should be the one where you want electronic prescriptions to be sent. If you choose to change pharmacy, simply notify our nursing staff.  Recommendations: Keep all of your pain medications in a safe place, under lock and key, even if you live alone. We will NOT replace lost, stolen, or damaged medication. After you fill your prescription, take 1 week's worth of pills and put them away in a safe place. You should keep a separate, properly labeled bottle for this purpose. The remainder should be kept in the original bottle. Use this as your primary supply, until it runs out. Once it's gone, then you know that you have 1 week's worth of medicine, and it is time to come in for a  prescription refill. If you do this correctly, it is unlikely that you will ever run out of medicine. To make sure that the above recommendation works, it is very important that you make sure your medication refill appointments are scheduled at least 1 week before you run  out of medicine. To do this in an effective manner, make sure that you do not leave the office without scheduling your next medication management appointment. Always ask the nursing staff to show you in your prescription , when your medication will be running out. Then arrange for the receptionist to get you a return appointment, at least 7 days before you run out of medicine. Do not wait until you have 1 or 2 pills left, to come in. This is very poor planning and does not take into consideration that we may need to cancel appointments due to bad weather, sickness, or emergencies affecting our staff. DO NOT ACCEPT A "Partial Fill": If for any reason your pharmacy does not have enough pills/tablets to completely fill or refill your prescription, do not allow for a "partial fill". The law allows the pharmacy to complete that prescription within 72 hours, without requiring a new prescription. If they do not fill the rest of your prescription within those 72 hours, you will need a separate prescription to fill the remaining amount, which we will NOT provide. If the reason for the partial fill is your insurance, you will need to talk to the pharmacist about payment alternatives for the remaining tablets, but again, DO NOT ACCEPT A PARTIAL FILL, unless you can trust your pharmacist to obtain the remainder of the pills within 72 hours.  Prescription refills and/or changes in medication(s):  Prescription refills, and/or changes in dose or medication, will be conducted only during scheduled medication management appointments. (Applies to both, written and electronic prescriptions.) No refills on procedure days. No medication will be changed or started  on procedure days. No changes, adjustments, and/or refills will be conducted on a procedure day. Doing so will interfere with the diagnostic portion of the procedure. No phone refills. No medications will be "called into the pharmacy". No Fax refills. No weekend refills. No Holliday refills. No after hours refills.  Remember:  Business hours are:  Monday to Thursday 8:00 AM to 4:00 PM Provider's Schedule: Milinda Pointer, MD - Appointments are:  Medication management: Monday and Wednesday 8:00 AM to 4:00 PM Procedure day: Tuesday and Thursday 7:30 AM to 4:00 PM Gillis Santa, MD - Appointments are:  Medication management: Tuesday and Thursday 8:00 AM to 4:00 PM Procedure day: Monday and Wednesday 7:30 AM to 4:00 PM (Last update: 11/02/2019) ____________________________________________________________________________________________  ____________________________________________________________________________________________  CBD (cannabidiol) & Delta-8 (Delta-8 tetrahydrocannabinol) WARNING  Intro: Cannabidiol (CBD) and tetrahydrocannabinol (THC), are two natural compounds found in plants of the Cannabis genus. They can both be extracted from hemp or cannabis. Hemp and cannabis come from the Cannabis sativa plant. Both compounds interact with your body's endocannabinoid system, but they have very different effects. CBD does not produce the high sensation associated with cannabis. Delta-8 tetrahydrocannabinol, also known as delta-8 THC, is a psychoactive substance found in the Cannabis sativa plant, of which marijuana and hemp are two varieties. THC is responsible for the high associated with the illicit use of marijuana.  Applicable to: All individuals currently taking or considering taking CBD (cannabidiol) and, more important, all patients taking opioid analgesic controlled substances (pain medication). (Example: oxycodone; oxymorphone; hydrocodone; hydromorphone; morphine; methadone;  tramadol; tapentadol; fentanyl; buprenorphine; butorphanol; dextromethorphan; meperidine; codeine; etc.)  Legal status: CBD remains a Schedule I drug prohibited for any use. CBD is illegal with one exception. In the [place], CBD has a limited Transport planner (FDA) approval for the treatment of two specific types of epilepsy disorders. Only one CBD  product has been approved by the FDA for this purpose: "Epidiolex". FDA is aware that some companies are marketing products containing cannabis and cannabis-derived compounds in ways that violate the [employer], Drug and Cosmetic Act [facility] Act) and that may put the health and safety of consumers at risk. The FDA, a Federal agency, has not enforced the CBD status since 2018. UPDATE: (05/31/2021) The Drug Enforcement Agency ([place]) issued a letter stating that "delta" cannabinoids, including Delta-8-THCO and Delta-9-THCO, synthetically derived from hemp do not qualify as hemp and will be viewed as Schedule I drugs. (Schedule I drugs, substances, or chemicals are defined as drugs with no currently accepted medical use and a high potential for abuse. Some examples of Schedule I drugs are: heroin, lysergic acid diethylamide (LSD), marijuana (cannabis), 3,4-methylenedioxymethamphetamine (ecstasy), methaqualone, and peyote.) (https://jennings.com/)  Legality: Some manufacturers ship CBD products nationally, which is illegal. Often such products are sold online and are therefore available throughout the country. CBD is openly sold in head shops and health food stores in some states where such sales have not been explicitly legalized. Selling unapproved products with unsubstantiated therapeutic claims is not only a violation of the law, but also can put patients at risk, as these products have not been proven to be safe or effective. Federal illegality makes it difficult to conduct research on CBD.  Reference: "FDA Regulation of Cannabis and Cannabis-Derived  Products, Including Cannabidiol (CBD)" - [employer].pt  Warning: CBD is not FDA approved and has not undergo the same manufacturing controls as prescription drugs.  This means that the purity and safety of available CBD may be questionable. Most of the time, despite manufacturer's claims, it is contaminated with THC (delta-9-tetrahydrocannabinol - the chemical in marijuana responsible for the "HIGH").  When this is the case, the [facility] contaminant will trigger a positive urine drug screen (UDS) test for Marijuana (carboxy-THC). Because a positive UDS for any illicit substance is a violation of our medication agreement, your opioid analgesics (pain medicine) may be permanently discontinued. The FDA recently put out a warning about 5 things that everyone should be aware of regarding Delta-8 THC: Delta-8 THC products have not been evaluated or approved by the FDA for safe use and may be marketed in ways that put the public health at risk. The FDA has received adverse event reports involving delta-8 THC-containing products. Delta-8 THC has psychoactive and intoxicating effects. Delta-8 THC manufacturing often involve use of potentially harmful chemicals to create the concentrations of delta-8 THC claimed in the marketplace. The final delta-8 THC product may have potentially harmful by-products (contaminants) due to the chemicals used in the process. Manufacturing of delta-8 THC products may occur in uncontrolled or unsanitary settings, which may lead to the presence of unsafe contaminants or other potentially harmful substances. Delta-8 THC products should be kept out of the reach of children and pets.  MORE ABOUT CBD  General Information: CBD was discovered in 21 and it is a derivative of the cannabis sativa genus plants (Marijuana and Hemp). It is one of the 113 identified substances found  in Marijuana. It accounts for up to 40% of the plant's extract. As of 2018, preliminary clinical studies on CBD included research for the treatment of anxiety, movement disorders, and pain. CBD is available and consumed in multiple forms, including inhalation of smoke or vapor, as an aerosol spray, and by mouth. It may be supplied as an oil containing CBD, capsules, dried cannabis, or as a liquid solution. CBD is thought not to be  as psychoactive as THC (delta-9-tetrahydrocannabinol - the chemical in marijuana responsible for the "HIGH"). Studies suggest that CBD may interact with different biological target receptors in the body, including cannabinoid and other neurotransmitter receptors. As of 2018 the mechanism of action for its biological effects has not been determined.  Side-effects  Adverse reactions: Dry mouth, diarrhea, decreased appetite, fatigue, drowsiness, malaise, weakness, sleep disturbances, and others.  Drug interactions: CBC may interact with other medications such as blood-thinners. Because CBD causes drowsiness on its own, it also increases the drowsiness caused by other medications, including antihistamines (such as Benadryl), benzodiazepines (Xanax, Ativan, Valium), antipsychotics, antidepressants and opioids, as well as alcohol and supplements such as kava, melatonin and St. John's Wort. Be cautious with the following combinations:   Brivaracetam (Briviact) Brivaracetam is changed and broken down by the body. CBD might decrease how quickly the body breaks down brivaracetam. This might increase levels of brivaracetam in the body.  Caffeine Caffeine is changed and broken down by the body. CBD might decrease how quickly the body breaks down caffeine. This might increase levels of caffeine in the body.  Carbamazepine (Tegretol) Carbamazepine is changed and broken down by the body. CBD might decrease how quickly the body breaks down carbamazepine. This might increase levels of  carbamazepine in the body and increase its side effects.  Citalopram (Celexa) Citalopram is changed and broken down by the body. CBD might decrease how quickly the body breaks down citalopram. This might increase levels of citalopram in the body and increase its side effects.  Clobazam (Onfi) Clobazam is changed and broken down by the liver. CBD might decrease how quickly the liver breaks down clobazam. This might increase the effects and side effects of clobazam.  Eslicarbazepine (Aptiom) Eslicarbazepine is changed and broken down by the body. CBD might decrease how quickly the body breaks down eslicarbazepine. This might increase levels of eslicarbazepine in the body by a small amount.  Everolimus (Zostress) Everolimus is changed and broken down by the body. CBD might decrease how quickly the body breaks down everolimus. This might increase levels of everolimus in the body.  Lithium Taking higher doses of CBD might increase levels of lithium. This can increase the risk of lithium toxicity.  Medications changed by the liver (Cytochrome P450 1A1 (CYP1A1) substrates) Some medications are changed and broken down by the liver. CBD might change how quickly the liver breaks down these medications. This could change the effects and side effects of these medications.  Medications changed by the liver (Cytochrome P450 1A2 (CYP1A2) substrates) Some medications are changed and broken down by the liver. CBD might change how quickly the liver breaks down these medications. This could change the effects and side effects of these medications.  Medications changed by the liver (Cytochrome P450 1B1 (CYP1B1) substrates) Some medications are changed and broken down by the liver. CBD might change how quickly the liver breaks down these medications. This could change the effects and side effects of these medications.  Medications changed by the liver (Cytochrome P450 2A6 (CYP2A6) substrates) Some medications  are changed and broken down by the liver. CBD might change how quickly the liver breaks down these medications. This could change the effects and side effects of these medications.  Medications changed by the liver (Cytochrome P450 2B6 (CYP2B6) substrates) Some medications are changed and broken down by the liver. CBD might change how quickly the liver breaks down these medications. This could change the effects and side effects of these medications.  Medications changed  by the liver (Cytochrome P450 2C19 (CYP2C19) substrates) Some medications are changed and broken down by the liver. CBD might change how quickly the liver breaks down these medications. This could change the effects and side effects of these medications.  Medications changed by the liver (Cytochrome P450 2C8 (CYP2C8) substrates) Some medications are changed and broken down by the liver. CBD might change how quickly the liver breaks down these medications. This could change the effects and side effects of these medications.  Medications changed by the liver (Cytochrome P450 2C9 (CYP2C9) substrates) Some medications are changed and broken down by the liver. CBD might change how quickly the liver breaks down these medications. This could change the effects and side effects of these medications.  Medications changed by the liver (Cytochrome P450 2D6 (CYP2D6) substrates) Some medications are changed and broken down by the liver. CBD might change how quickly the liver breaks down these medications. This could change the effects and side effects of these medications.  Medications changed by the liver (Cytochrome P450 2E1 (CYP2E1) substrates) Some medications are changed and broken down by the liver. CBD might change how quickly the liver breaks down these medications. This could change the effects and side effects of these medications.  Medications changed by the liver (Cytochrome P450 3A4 (CYP3A4) substrates) Some medications are  changed and broken down by the liver. CBD might change how quickly the liver breaks down these medications. This could change the effects and side effects of these medications.  Medications changed by the liver (Glucuronidated drugs) Some medications are changed and broken down by the liver. CBD might change how quickly the liver breaks down these medications. This could change the effects and side effects of these medications.  Medications that decrease the breakdown of other medications by the liver (Cytochrome P450 2C19 (CYP2C19) inhibitors) CBD is changed and broken down by the liver. Some drugs decrease how quickly the liver changes and breaks down CBD. This could change the effects and side effects of CBD.  Medications that decrease the breakdown of other medications in the liver (Cytochrome P450 3A4 (CYP3A4) inhibitors) CBD is changed and broken down by the liver. Some drugs decrease how quickly the liver changes and breaks down CBD. This could change the effects and side effects of CBD.  Medications that increase breakdown of other medications by the liver (Cytochrome P450 3A4 (CYP3A4) inducers) CBD is changed and broken down by the liver. Some drugs increase how quickly the liver changes and breaks down CBD. This could change the effects and side effects of CBD.  Medications that increase the breakdown of other medications by the liver (Cytochrome P450 2C19 (CYP2C19) inducers) CBD is changed and broken down by the liver. Some drugs increase how quickly the liver changes and breaks down CBD. This could change the effects and side effects of CBD.  Methadone (Dolophine) Methadone is broken down by the liver. CBD might decrease how quickly the liver breaks down methadone. Taking cannabidiol along with methadone might increase the effects and side effects of methadone.  Rufinamide (Banzel) Rufinamide is changed and broken down by the body. CBD might decrease how quickly the body breaks down  rufinamide. This might increase levels of rufinamide in the body by a small amount.  Sedative medications (CNS depressants) CBD might cause sleepiness and slowed breathing. Some medications, called sedatives, can also cause sleepiness and slowed breathing. Taking CBD with sedative medications might cause breathing problems and/or too much sleepiness.  Sirolimus (Rapamune) Sirolimus is changed and  broken down by the body. CBD might decrease how quickly the body breaks down sirolimus. This might increase levels of sirolimus in the body.  Stiripentol (Diacomit) Stiripentol is changed and broken down by the body. CBD might decrease how quickly the body breaks down stiripentol. This might increase levels of stiripentol in the body and increase its side effects.  Tacrolimus (Prograf) Tacrolimus is changed and broken down by the body. CBD might decrease how quickly the body breaks down tacrolimus. This might increase levels of tacrolimus in the body.  Tamoxifen (Soltamox) Tamoxifen is changed and broken down by the body. CBD might affect how quickly the body breaks down tamoxifen. This might affect levels of tamoxifen in the body.  Topiramate (Topamax) Topiramate is changed and broken down by the body. CBD might decrease how quickly the body breaks down topiramate. This might increase levels of topiramate in the body by a small amount.  Valproate Valproic acid can cause liver injury. Taking cannabidiol with valproic acid might increase the chance of liver injury. CBD and/or valproic acid might need to be stopped, or the dose might need to be reduced.  Warfarin (Coumadin) CBD might increase levels of warfarin, which can increase the risk for bleeding. CBD and/or warfarin might need to be stopped, or the dose might need to be reduced.  Zonisamide Zonisamide is changed and broken down by the body. CBD might decrease how quickly the body breaks down zonisamide. This might increase levels of  zonisamide in the body by a small amount. (Last update: 06/12/2021) ____________________________________________________________________________________________

## 2022-01-09 ENCOUNTER — Other Ambulatory Visit: Payer: Self-pay | Admitting: Pain Medicine

## 2022-01-09 DIAGNOSIS — M4727 Other spondylosis with radiculopathy, lumbosacral region: Secondary | ICD-10-CM

## 2022-01-09 DIAGNOSIS — F1190 Opioid use, unspecified, uncomplicated: Secondary | ICD-10-CM

## 2022-01-09 DIAGNOSIS — G894 Chronic pain syndrome: Secondary | ICD-10-CM

## 2022-01-09 DIAGNOSIS — R29898 Other symptoms and signs involving the musculoskeletal system: Secondary | ICD-10-CM

## 2022-01-09 DIAGNOSIS — M961 Postlaminectomy syndrome, not elsewhere classified: Secondary | ICD-10-CM

## 2022-01-09 DIAGNOSIS — M47816 Spondylosis without myelopathy or radiculopathy, lumbar region: Secondary | ICD-10-CM

## 2022-01-09 DIAGNOSIS — G96198 Other disorders of meninges, not elsewhere classified: Secondary | ICD-10-CM

## 2022-01-09 DIAGNOSIS — Z79899 Other long term (current) drug therapy: Secondary | ICD-10-CM

## 2022-01-09 DIAGNOSIS — G8929 Other chronic pain: Secondary | ICD-10-CM

## 2022-01-09 DIAGNOSIS — Z79891 Long term (current) use of opiate analgesic: Secondary | ICD-10-CM

## 2022-01-09 DIAGNOSIS — M5137 Other intervertebral disc degeneration, lumbosacral region: Secondary | ICD-10-CM

## 2022-01-13 ENCOUNTER — Other Ambulatory Visit: Payer: Self-pay | Admitting: Pain Medicine

## 2022-01-13 DIAGNOSIS — R29898 Other symptoms and signs involving the musculoskeletal system: Secondary | ICD-10-CM

## 2022-01-13 DIAGNOSIS — M961 Postlaminectomy syndrome, not elsewhere classified: Secondary | ICD-10-CM

## 2022-01-13 DIAGNOSIS — G8929 Other chronic pain: Secondary | ICD-10-CM

## 2022-01-13 DIAGNOSIS — G894 Chronic pain syndrome: Secondary | ICD-10-CM

## 2022-01-13 DIAGNOSIS — M4727 Other spondylosis with radiculopathy, lumbosacral region: Secondary | ICD-10-CM

## 2022-01-13 DIAGNOSIS — F1190 Opioid use, unspecified, uncomplicated: Secondary | ICD-10-CM

## 2022-01-13 DIAGNOSIS — Z79891 Long term (current) use of opiate analgesic: Secondary | ICD-10-CM

## 2022-01-13 DIAGNOSIS — M47816 Spondylosis without myelopathy or radiculopathy, lumbar region: Secondary | ICD-10-CM

## 2022-01-13 DIAGNOSIS — Z79899 Other long term (current) drug therapy: Secondary | ICD-10-CM

## 2022-01-13 DIAGNOSIS — G96198 Other disorders of meninges, not elsewhere classified: Secondary | ICD-10-CM

## 2022-01-13 DIAGNOSIS — M5137 Other intervertebral disc degeneration, lumbosacral region: Secondary | ICD-10-CM

## 2022-01-14 ENCOUNTER — Other Ambulatory Visit (HOSPITAL_BASED_OUTPATIENT_CLINIC_OR_DEPARTMENT_OTHER): Payer: No Typology Code available for payment source | Admitting: Pain Medicine

## 2022-01-14 DIAGNOSIS — M5137 Other intervertebral disc degeneration, lumbosacral region: Secondary | ICD-10-CM

## 2022-01-14 DIAGNOSIS — Z978 Presence of other specified devices: Secondary | ICD-10-CM

## 2022-01-14 DIAGNOSIS — G96198 Other disorders of meninges, not elsewhere classified: Secondary | ICD-10-CM

## 2022-01-14 DIAGNOSIS — M5442 Lumbago with sciatica, left side: Secondary | ICD-10-CM

## 2022-01-14 DIAGNOSIS — M4727 Other spondylosis with radiculopathy, lumbosacral region: Secondary | ICD-10-CM

## 2022-01-14 DIAGNOSIS — M5441 Lumbago with sciatica, right side: Secondary | ICD-10-CM

## 2022-01-14 DIAGNOSIS — G894 Chronic pain syndrome: Secondary | ICD-10-CM

## 2022-01-14 DIAGNOSIS — M79604 Pain in right leg: Secondary | ICD-10-CM

## 2022-01-14 DIAGNOSIS — Z79899 Other long term (current) drug therapy: Secondary | ICD-10-CM

## 2022-01-14 DIAGNOSIS — G8929 Other chronic pain: Secondary | ICD-10-CM

## 2022-01-14 DIAGNOSIS — R29898 Other symptoms and signs involving the musculoskeletal system: Secondary | ICD-10-CM

## 2022-01-14 DIAGNOSIS — Z79891 Long term (current) use of opiate analgesic: Secondary | ICD-10-CM

## 2022-01-14 DIAGNOSIS — M5410 Radiculopathy, site unspecified: Secondary | ICD-10-CM

## 2022-01-14 DIAGNOSIS — M79605 Pain in left leg: Secondary | ICD-10-CM

## 2022-01-14 DIAGNOSIS — Z96652 Presence of left artificial knee joint: Secondary | ICD-10-CM

## 2022-01-14 DIAGNOSIS — M25562 Pain in left knee: Secondary | ICD-10-CM

## 2022-01-14 DIAGNOSIS — M961 Postlaminectomy syndrome, not elsewhere classified: Secondary | ICD-10-CM

## 2022-01-14 DIAGNOSIS — M47816 Spondylosis without myelopathy or radiculopathy, lumbar region: Secondary | ICD-10-CM

## 2022-01-14 DIAGNOSIS — F1190 Opioid use, unspecified, uncomplicated: Secondary | ICD-10-CM

## 2022-01-14 MED ORDER — MORPHINE SULFATE ER 30 MG PO TBCR: 30.0000 mg | EXTENDED_RELEASE_TABLET | Freq: Two times a day (BID) | ORAL | 0 refills | Status: DC

## 2022-01-14 MED ORDER — HYDROMORPHONE HCL 4 MG PO TABS: 4.0000 mg | ORAL_TABLET | Freq: Four times a day (QID) | ORAL | 0 refills | Status: DC | PRN

## 2022-01-14 NOTE — Progress Notes (Signed)
(  01/14/2022) Received phone message from patient where he is clearly dissatisfied with my service. In the phone message, He calls me "a sadistic son of a bitch".   Interested in the reason for this outburst, I have reviewed the EMR for the events leading to this.   Today's PMP review shows that on 12/20/21 he received a partial fill on a Hydromorphone prescription written for him on 11/21/2021. The prescription was for 120 pills and he opted to get only 38 pills. I reviewed his encounters and confirmed that on 07/16/2021 he was provided with information pertaining to our medication management program, including the document on "Medication Rules" and "Medication Recommendations and Reminders", where under "Recommendations" it clearly states..."DO NOT ACCEPT A "Partial Fill"" followed by... "we will NOT provide" additional prescriptions to fill the remaining balance on the shorted pill count.   In addition, all of my opioid controlled substance electronic prescriptions sent to the pharmacies, including prescription [id number], contain the following: "Note to Pharmacy: DO NOT: delete (not duplicate); no partial-fill (will deny script to complete), no refill request (F/U required). DISPENSE: 1 day early if closed on fill date. WARN: No CNS-depressants within 8 hrs of med." And yet, a partial fill was done, and accepted by the patient.   On 09/19/21, the patient was provided with enough prescriptions for MS Contin (ER Morphine) to last until 01/12/2022. On 11/21/2021 he was provided with enough prescriptions for Dilaudid (Hydromorphone) to last until 01/20/2022. A follow-up medication management appointment was provided for 01/06/2022. On 12/30/2021, the patient called and was reminded by our nursing staff Cindie Laroche) that we do not write prescriptions to complete a "short fill". The patient did not keep his 01/06/2022 appointment, or called to cancel/reschedule.   At this point, it is clear to me that there is  no further viable patient-physician relationship. Based on this, today I am informing the patient of the official termination of our patient-physician relationship. I will be sending a prescription to the pharmacy to cover him for 30 days, during which I will remain available for emergencies only. After such period, I will no longer be available to Mr. Sinning.

## 2022-02-18 DIAGNOSIS — F321 Major depressive disorder, single episode, moderate: Secondary | ICD-10-CM | POA: Diagnosis not present

## 2022-02-18 DIAGNOSIS — G894 Chronic pain syndrome: Secondary | ICD-10-CM | POA: Diagnosis not present

## 2022-02-19 DIAGNOSIS — I272 Pulmonary hypertension, unspecified: Secondary | ICD-10-CM | POA: Diagnosis not present

## 2022-03-26 IMAGING — DX DG FOOT COMPLETE 3+V*R*
3 series · 3 of 3 positions shown · non-contrast
Comparison: None.

CLINICAL DATA: Pt arrives with reports of recent falls, c/o pain
and swelling to right foot. Pain is lateral and anterior

EXAM:
RIGHT FOOT COMPLETE - 3+ VIEW

[foot ap]
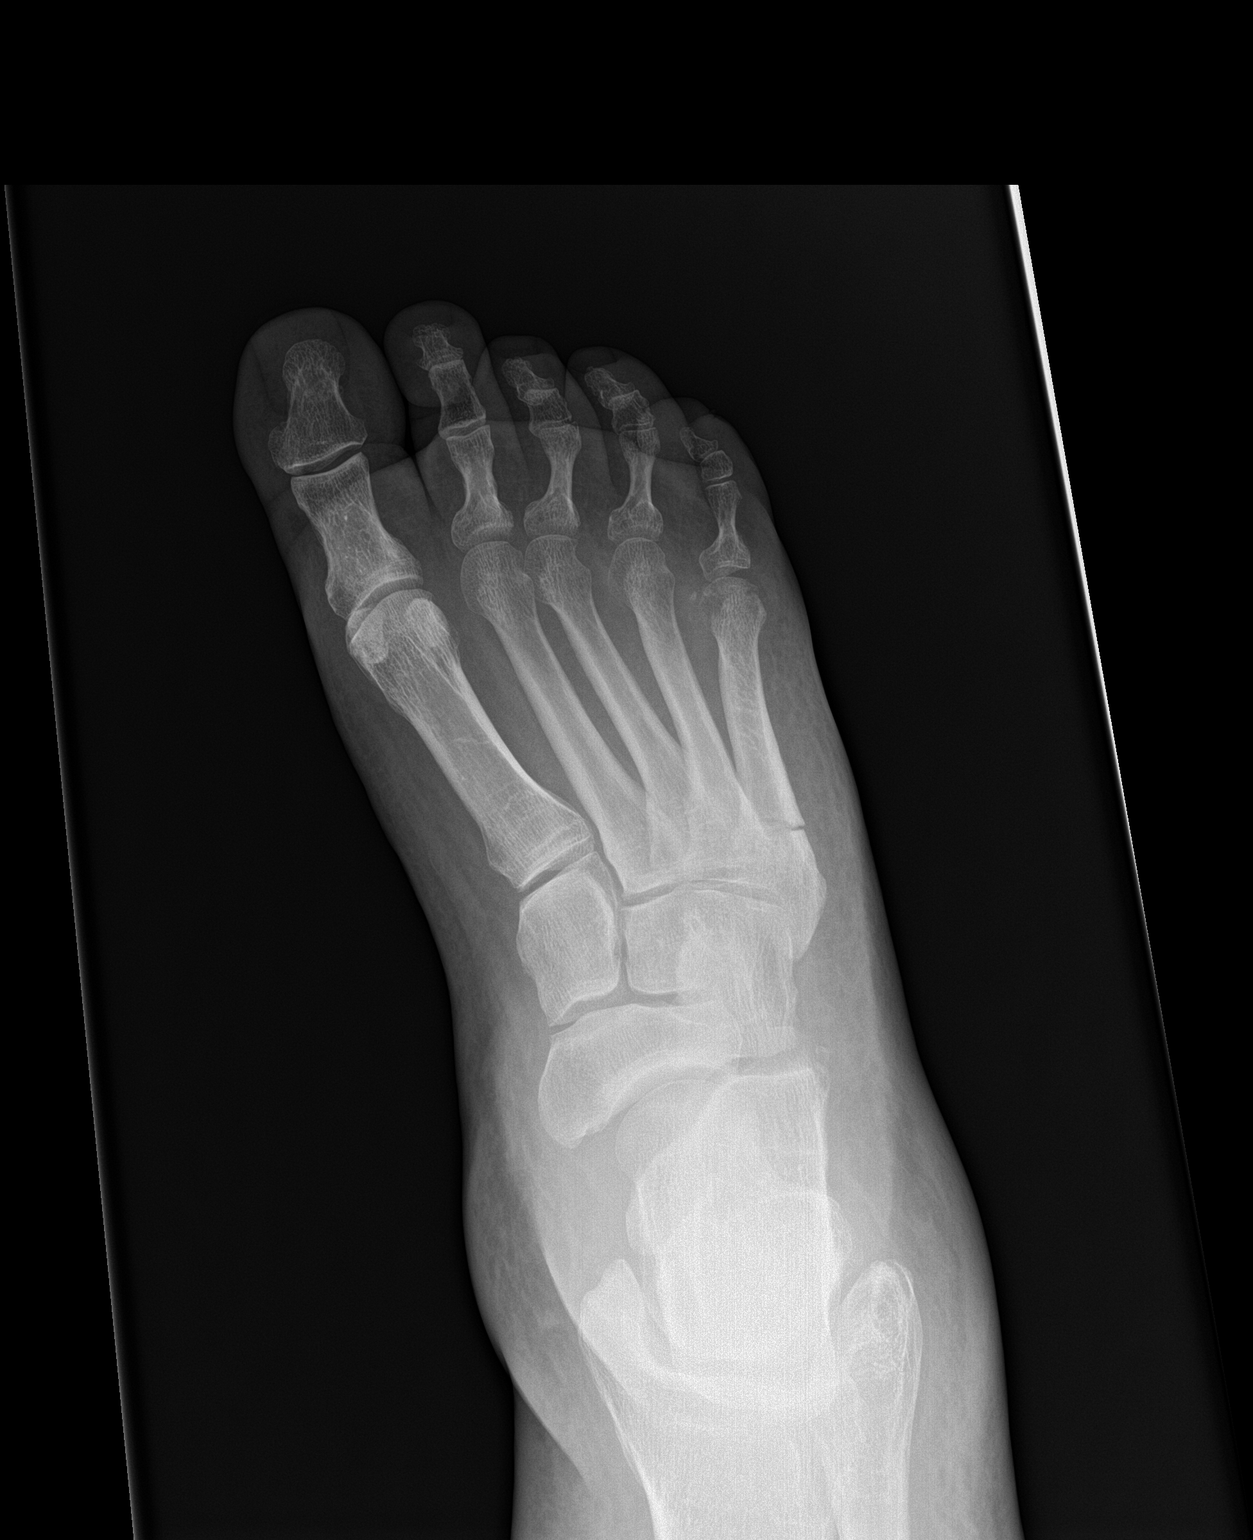

[foot obl]
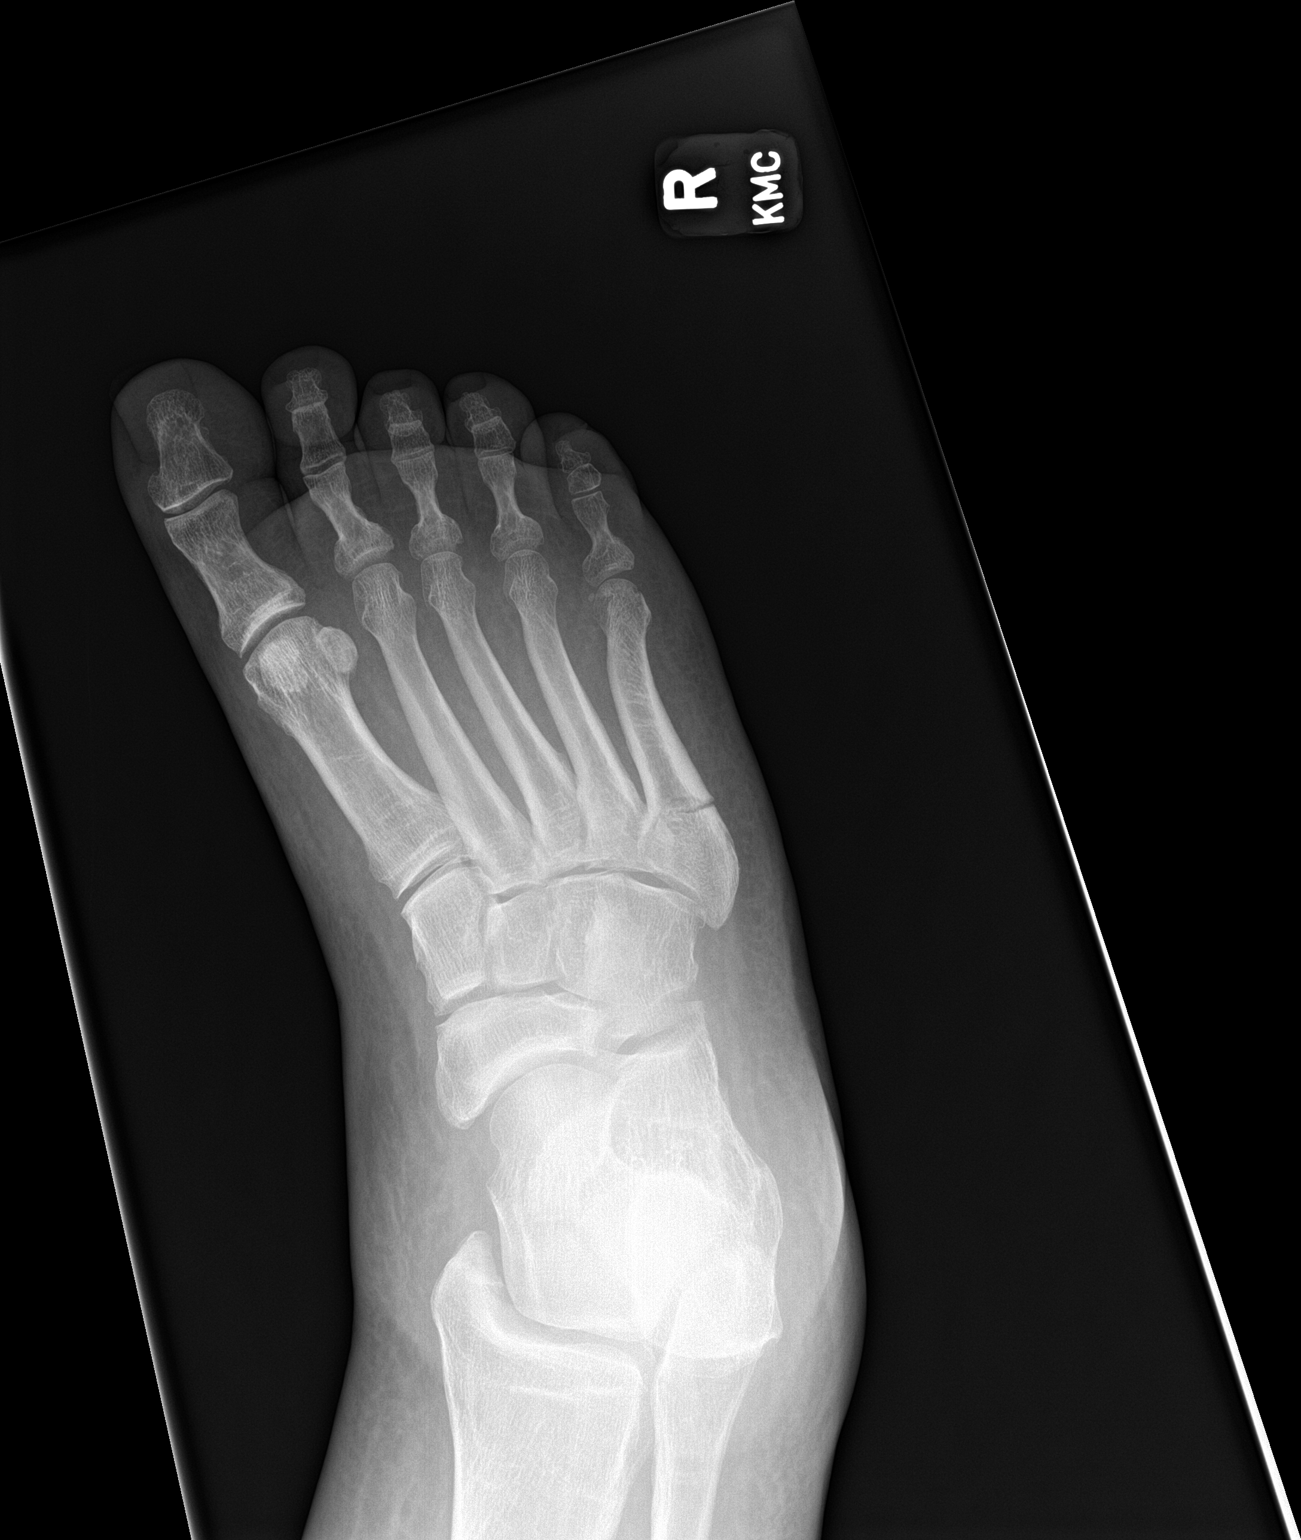

[foot lat]
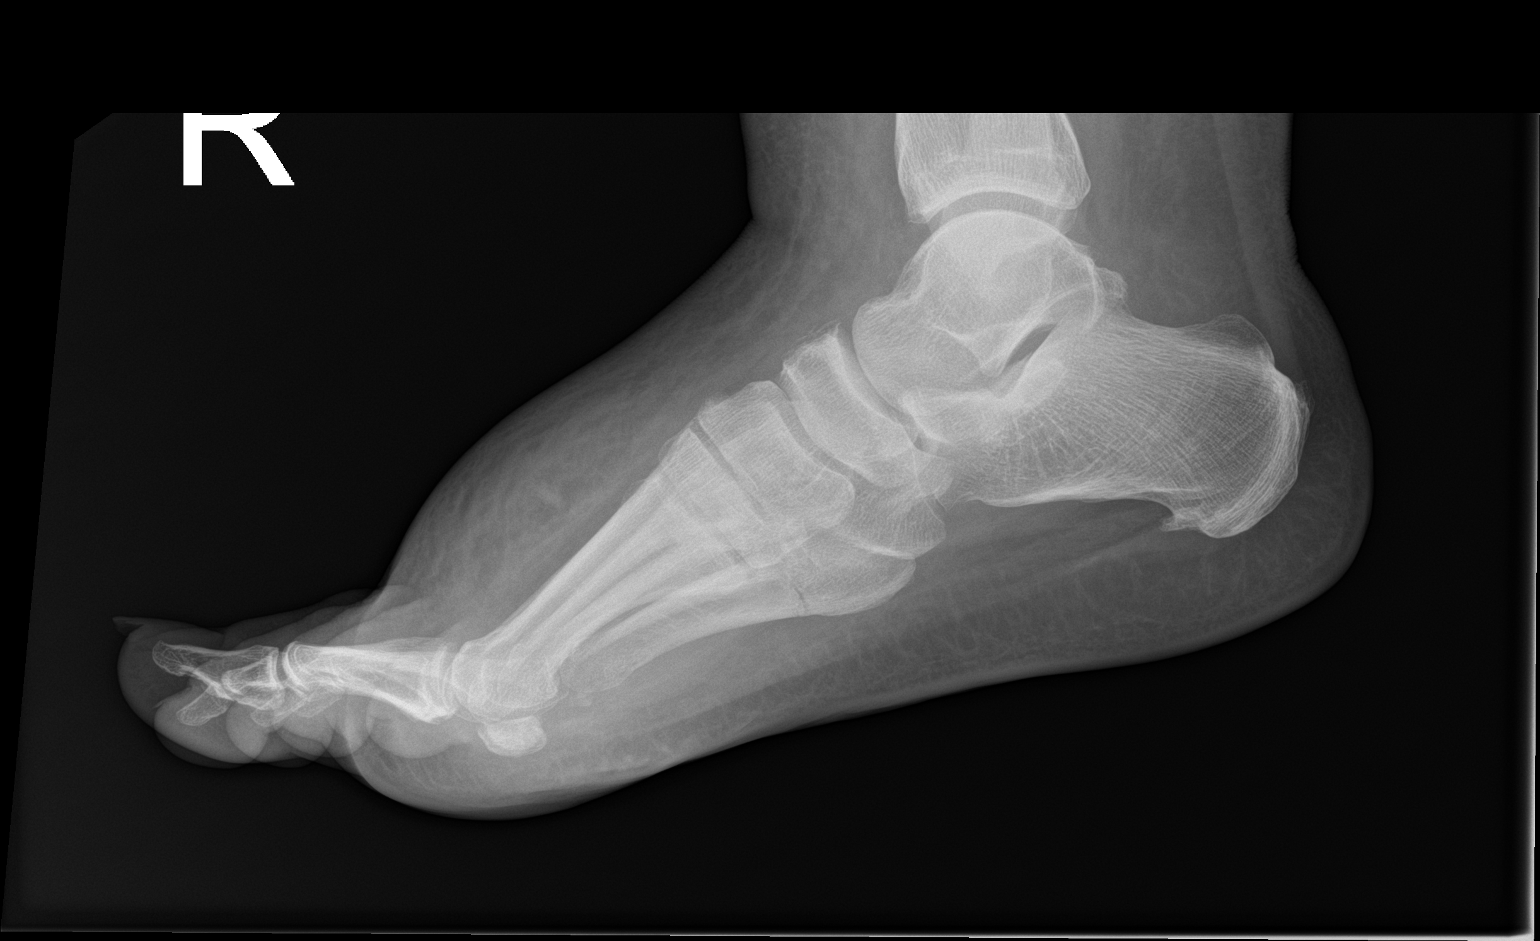

[3 of 3 positions shown; findings below may reference images not displayed]

FINDINGS: There is nondisplaced a transverse fracture between the base and the
shaft of the fifth metatarsal. The remaining bones of the foot are
unremarkable. There is no evidence of dislocation. There is
pronounced regional soft tissue swelling.
IMPRESSION: Nondisplaced fracture between the base and shaft of the fifth
metatarsal (Jones fracture).

## 2022-03-31 ENCOUNTER — Emergency Department (HOSPITAL_COMMUNITY): Payer: Medicare HMO

## 2022-03-31 ENCOUNTER — Inpatient Hospital Stay (HOSPITAL_COMMUNITY): Payer: Medicare HMO | Admitting: Certified Registered Nurse Anesthetist

## 2022-03-31 ENCOUNTER — Inpatient Hospital Stay (HOSPITAL_COMMUNITY)
Admission: EM | Admit: 2022-03-31 | Discharge: 2022-04-04 | DRG: 853 | Disposition: A | Discharge status: Home Health | Payer: Medicare HMO | Attending: Family Medicine | Admitting: Family Medicine

## 2022-03-31 ENCOUNTER — Other Ambulatory Visit: Payer: Self-pay

## 2022-03-31 ENCOUNTER — Encounter (HOSPITAL_COMMUNITY)
Admission: EM | Disposition: A | Discharge status: Home Health | Payer: Self-pay | Source: Home / Self Care | Attending: Family Medicine

## 2022-03-31 ENCOUNTER — Encounter (HOSPITAL_COMMUNITY): Payer: Self-pay

## 2022-03-31 DIAGNOSIS — N138 Other obstructive and reflux uropathy: Secondary | ICD-10-CM | POA: Diagnosis not present

## 2022-03-31 DIAGNOSIS — R404 Transient alteration of awareness: Secondary | ICD-10-CM | POA: Diagnosis not present

## 2022-03-31 DIAGNOSIS — I509 Heart failure, unspecified: Secondary | ICD-10-CM | POA: Diagnosis not present

## 2022-03-31 DIAGNOSIS — Z6841 Body Mass Index (BMI) 40.0 and over, adult: Secondary | ICD-10-CM | POA: Diagnosis not present

## 2022-03-31 DIAGNOSIS — R519 Headache, unspecified: Secondary | ICD-10-CM | POA: Diagnosis not present

## 2022-03-31 DIAGNOSIS — Z79891 Long term (current) use of opiate analgesic: Secondary | ICD-10-CM | POA: Diagnosis not present

## 2022-03-31 DIAGNOSIS — D751 Secondary polycythemia: Secondary | ICD-10-CM | POA: Diagnosis present

## 2022-03-31 DIAGNOSIS — N132 Hydronephrosis with renal and ureteral calculous obstruction: Secondary | ICD-10-CM | POA: Diagnosis not present

## 2022-03-31 DIAGNOSIS — Z1152 Encounter for screening for COVID-19: Secondary | ICD-10-CM | POA: Diagnosis not present

## 2022-03-31 DIAGNOSIS — F17290 Nicotine dependence, other tobacco product, uncomplicated: Secondary | ICD-10-CM | POA: Diagnosis not present

## 2022-03-31 DIAGNOSIS — E1142 Type 2 diabetes mellitus with diabetic polyneuropathy: Secondary | ICD-10-CM | POA: Diagnosis present

## 2022-03-31 DIAGNOSIS — K760 Fatty (change of) liver, not elsewhere classified: Secondary | ICD-10-CM | POA: Diagnosis not present

## 2022-03-31 DIAGNOSIS — Z9989 Dependence on other enabling machines and devices: Secondary | ICD-10-CM

## 2022-03-31 DIAGNOSIS — A4159 Other Gram-negative sepsis: Secondary | ICD-10-CM | POA: Diagnosis not present

## 2022-03-31 DIAGNOSIS — Z7401 Bed confinement status: Secondary | ICD-10-CM

## 2022-03-31 DIAGNOSIS — G894 Chronic pain syndrome: Secondary | ICD-10-CM | POA: Diagnosis not present

## 2022-03-31 DIAGNOSIS — I517 Cardiomegaly: Secondary | ICD-10-CM | POA: Diagnosis not present

## 2022-03-31 DIAGNOSIS — Z7982 Long term (current) use of aspirin: Secondary | ICD-10-CM | POA: Diagnosis not present

## 2022-03-31 DIAGNOSIS — Z96652 Presence of left artificial knee joint: Secondary | ICD-10-CM | POA: Diagnosis present

## 2022-03-31 DIAGNOSIS — Z993 Dependence on wheelchair: Secondary | ICD-10-CM | POA: Diagnosis not present

## 2022-03-31 DIAGNOSIS — M1990 Unspecified osteoarthritis, unspecified site: Secondary | ICD-10-CM | POA: Diagnosis present

## 2022-03-31 DIAGNOSIS — Z9981 Dependence on supplemental oxygen: Secondary | ICD-10-CM

## 2022-03-31 DIAGNOSIS — Z888 Allergy status to other drugs, medicaments and biological substances status: Secondary | ICD-10-CM | POA: Diagnosis not present

## 2022-03-31 DIAGNOSIS — Z23 Encounter for immunization: Secondary | ICD-10-CM

## 2022-03-31 DIAGNOSIS — J449 Chronic obstructive pulmonary disease, unspecified: Secondary | ICD-10-CM | POA: Diagnosis present

## 2022-03-31 DIAGNOSIS — R000 Tachycardia, unspecified: Secondary | ICD-10-CM | POA: Diagnosis not present

## 2022-03-31 DIAGNOSIS — A419 Sepsis, unspecified organism: Secondary | ICD-10-CM | POA: Diagnosis not present

## 2022-03-31 DIAGNOSIS — G9340 Encephalopathy, unspecified: Secondary | ICD-10-CM | POA: Diagnosis not present

## 2022-03-31 DIAGNOSIS — E118 Type 2 diabetes mellitus with unspecified complications: Secondary | ICD-10-CM | POA: Diagnosis present

## 2022-03-31 DIAGNOSIS — N202 Calculus of kidney with calculus of ureter: Secondary | ICD-10-CM | POA: Diagnosis present

## 2022-03-31 DIAGNOSIS — Z91199 Patient's noncompliance with other medical treatment and regimen due to unspecified reason: Secondary | ICD-10-CM

## 2022-03-31 DIAGNOSIS — R4182 Altered mental status, unspecified: Secondary | ICD-10-CM | POA: Diagnosis not present

## 2022-03-31 DIAGNOSIS — B372 Candidiasis of skin and nail: Secondary | ICD-10-CM | POA: Diagnosis not present

## 2022-03-31 DIAGNOSIS — N201 Calculus of ureter: Secondary | ICD-10-CM | POA: Diagnosis not present

## 2022-03-31 DIAGNOSIS — R6520 Severe sepsis without septic shock: Secondary | ICD-10-CM | POA: Diagnosis not present

## 2022-03-31 DIAGNOSIS — N3289 Other specified disorders of bladder: Secondary | ICD-10-CM | POA: Diagnosis not present

## 2022-03-31 DIAGNOSIS — G9341 Metabolic encephalopathy: Secondary | ICD-10-CM | POA: Diagnosis present

## 2022-03-31 DIAGNOSIS — Z833 Family history of diabetes mellitus: Secondary | ICD-10-CM

## 2022-03-31 DIAGNOSIS — Z978 Presence of other specified devices: Secondary | ICD-10-CM

## 2022-03-31 DIAGNOSIS — J9611 Chronic respiratory failure with hypoxia: Secondary | ICD-10-CM | POA: Diagnosis present

## 2022-03-31 DIAGNOSIS — G4733 Obstructive sleep apnea (adult) (pediatric): Secondary | ICD-10-CM | POA: Diagnosis present

## 2022-03-31 DIAGNOSIS — J9610 Chronic respiratory failure, unspecified whether with hypoxia or hypercapnia: Secondary | ICD-10-CM | POA: Diagnosis present

## 2022-03-31 DIAGNOSIS — Z79899 Other long term (current) drug therapy: Secondary | ICD-10-CM | POA: Diagnosis not present

## 2022-03-31 DIAGNOSIS — N390 Urinary tract infection, site not specified: Secondary | ICD-10-CM | POA: Diagnosis not present

## 2022-03-31 DIAGNOSIS — R262 Difficulty in walking, not elsewhere classified: Secondary | ICD-10-CM | POA: Diagnosis present

## 2022-03-31 DIAGNOSIS — I272 Pulmonary hypertension, unspecified: Secondary | ICD-10-CM | POA: Diagnosis not present

## 2022-03-31 DIAGNOSIS — G928 Other toxic encephalopathy: Secondary | ICD-10-CM | POA: Diagnosis present

## 2022-03-31 DIAGNOSIS — R0902 Hypoxemia: Secondary | ICD-10-CM | POA: Diagnosis not present

## 2022-03-31 DIAGNOSIS — K219 Gastro-esophageal reflux disease without esophagitis: Secondary | ICD-10-CM | POA: Diagnosis present

## 2022-03-31 DIAGNOSIS — Z743 Need for continuous supervision: Secondary | ICD-10-CM | POA: Diagnosis not present

## 2022-03-31 DIAGNOSIS — I5032 Chronic diastolic (congestive) heart failure: Secondary | ICD-10-CM | POA: Diagnosis not present

## 2022-03-31 DIAGNOSIS — R918 Other nonspecific abnormal finding of lung field: Secondary | ICD-10-CM | POA: Diagnosis not present

## 2022-03-31 HISTORY — PX: CYSTOSCOPY W/ URETERAL STENT PLACEMENT: SHX1429

## 2022-03-31 LAB — COMPREHENSIVE METABOLIC PANEL
ALT: 26 U/L (ref 0–44)
AST: 40 U/L (ref 15–41)
Albumin: 2.8 g/dL — ABNORMAL LOW (ref 3.5–5.0)
Alkaline Phosphatase: 70 U/L (ref 38–126)
Anion gap: 11 (ref 5–15)
BUN: 15 mg/dL (ref 6–20)
CO2: 26 mmol/L (ref 22–32)
Calcium: 8.8 mg/dL — ABNORMAL LOW (ref 8.9–10.3)
Chloride: 98 mmol/L (ref 98–111)
Creatinine, Ser: 1.13 mg/dL (ref 0.61–1.24)
GFR, Estimated: >60 mL/min (ref >60)
Glucose, Bld: 162 mg/dL — ABNORMAL HIGH (ref 70–99)
Potassium: 3.7 mmol/L (ref 3.5–5.1)
Sodium: 135 mmol/L (ref 135–145)
Total Bilirubin: 0.7 mg/dL (ref 0.3–1.2)
Total Protein: 8.1 g/dL (ref 6.5–8.1)

## 2022-03-31 LAB — I-STAT CHEM 8, ED
BUN: 15 mg/dL (ref 6–20)
Calcium, Ion: 1.01 mmol/L — ABNORMAL LOW (ref 1.15–1.40)
Chloride: 95 mmol/L — ABNORMAL LOW (ref 98–111)
Creatinine, Ser: 1 mg/dL (ref 0.61–1.24)
Glucose, Bld: 165 mg/dL — ABNORMAL HIGH (ref 70–99)
HCT: 39 % (ref 39.0–52.0)
Hemoglobin: 13.3 g/dL (ref 13.0–17.0)
Potassium: 3.9 mmol/L (ref 3.5–5.1)
Sodium: 134 mmol/L — ABNORMAL LOW (ref 135–145)
TCO2: 30 mmol/L (ref 22–32)

## 2022-03-31 LAB — CBC WITH DIFFERENTIAL/PLATELET
Abs Immature Granulocytes: 0.07 10*3/uL (ref 0.00–0.07)
Basophils Absolute: 0 10*3/uL (ref 0.0–0.1)
Basophils Relative: 0 %
Eosinophils Absolute: 0 10*3/uL (ref 0.0–0.5)
Eosinophils Relative: 0 %
HCT: 39.5 % (ref 39.0–52.0)
Hemoglobin: 12.7 g/dL — ABNORMAL LOW (ref 13.0–17.0)
Immature Granulocytes: 1 %
Lymphocytes Relative: 11 %
Lymphs Abs: 1.3 10*3/uL (ref 0.7–4.0)
MCH: 30.1 pg (ref 26.0–34.0)
MCHC: 32.2 g/dL (ref 30.0–36.0)
MCV: 93.6 fL (ref 80.0–100.0)
Monocytes Absolute: 0.7 10*3/uL (ref 0.1–1.0)
Monocytes Relative: 6 %
Neutro Abs: 9.4 10*3/uL — ABNORMAL HIGH (ref 1.7–7.7)
Neutrophils Relative %: 82 %
Platelets: 271 10*3/uL (ref 150–400)
RBC: 4.22 MIL/uL (ref 4.22–5.81)
RDW: 12.6 % (ref 11.5–15.5)
WBC: 11.5 10*3/uL — ABNORMAL HIGH (ref 4.0–10.5)
nRBC: 0 % (ref 0.0–0.2)

## 2022-03-31 LAB — RESP PANEL BY RT-PCR (RSV, FLU A&B, COVID)  RVPGX2
Influenza A by PCR: NEGATIVE
Influenza B by PCR: NEGATIVE
Resp Syncytial Virus by PCR: NEGATIVE
SARS Coronavirus 2 by RT PCR: NEGATIVE

## 2022-03-31 LAB — BLOOD GAS, ARTERIAL
Acid-Base Excess: 6.6 mmol/L — ABNORMAL HIGH (ref 0.0–2.0)
Bicarbonate: 29.1 mmol/L — ABNORMAL HIGH (ref 20.0–28.0)
O2 Saturation: 97.6 %
Patient temperature: 37
pCO2 arterial: 34 mmHg (ref 32–48)
pH, Arterial: 7.54 — ABNORMAL HIGH (ref 7.35–7.45)
pO2, Arterial: 87 mmHg (ref 83–108)

## 2022-03-31 LAB — ETHANOL: Alcohol, Ethyl (B): <10 mg/dL (ref <10)

## 2022-03-31 LAB — PROTIME-INR
INR: 1.1 (ref 0.8–1.2)
Prothrombin Time: 14.2 seconds (ref 11.4–15.2)

## 2022-03-31 LAB — LACTIC ACID, PLASMA: Lactic Acid, Venous: 1.8 mmol/L (ref 0.5–1.9)

## 2022-03-31 LAB — AMMONIA: Ammonia: 27 umol/L (ref 9–35)

## 2022-03-31 LAB — APTT: aPTT: 32 seconds (ref 24–36)

## 2022-03-31 SURGERY — CYSTOSCOPY, WITH RETROGRADE PYELOGRAM AND URETERAL STENT INSERTION
Anesthesia: General | Site: Ureter | Laterality: Left

## 2022-03-31 MED ORDER — ONDANSETRON HCL 4 MG/2ML IJ SOLN
INTRAMUSCULAR | Status: AC
  Filled 2022-03-31: qty 2

## 2022-03-31 MED ORDER — PHENYLEPHRINE 80 MCG/ML (10ML) SYRINGE FOR IV PUSH (FOR BLOOD PRESSURE SUPPORT)
PREFILLED_SYRINGE | INTRAVENOUS | Status: DC | PRN
  Administered 2022-03-31: 160 ug via INTRAVENOUS
  Administered 2022-03-31 (×2): 80 ug via INTRAVENOUS

## 2022-03-31 MED ORDER — LIDOCAINE HCL (PF) 2 % IJ SOLN
INTRAMUSCULAR | Status: AC
  Filled 2022-03-31: qty 5

## 2022-03-31 MED ORDER — PROPOFOL 10 MG/ML IV BOLUS
INTRAVENOUS | Status: AC
  Filled 2022-03-31: qty 20

## 2022-03-31 MED ORDER — VANCOMYCIN HCL IN DEXTROSE 1-5 GM/200ML-% IV SOLN: 1000.0000 mg | Freq: Once | INTRAVENOUS | Status: DC

## 2022-03-31 MED ORDER — DEXAMETHASONE SODIUM PHOSPHATE 10 MG/ML IJ SOLN
INTRAMUSCULAR | Status: DC | PRN
  Administered 2022-03-31: 4 mg via INTRAVENOUS

## 2022-03-31 MED ORDER — METRONIDAZOLE 500 MG/100ML IV SOLN
500.0000 mg | Freq: Once | INTRAVENOUS | Status: AC
  Administered 2022-03-31: 500 mg via INTRAVENOUS
  Filled 2022-03-31: qty 100

## 2022-03-31 MED ORDER — PREGABALIN 75 MG PO CAPS
150.0000 mg | ORAL_CAPSULE | Freq: Three times a day (TID) | ORAL | Status: DC
  Administered 2022-04-01 (×3): 150 mg via ORAL
  Filled 2022-03-31 (×3): qty 2

## 2022-03-31 MED ORDER — HYDROMORPHONE HCL 1 MG/ML IJ SOLN
1.0000 mg | INTRAMUSCULAR | Status: AC | PRN
  Administered 2022-04-01: 1 mg via INTRAVENOUS
  Filled 2022-03-31: qty 1

## 2022-03-31 MED ORDER — INSULIN ASPART 100 UNIT/ML IJ SOLN
0.0000 [IU] | Freq: Three times a day (TID) | INTRAMUSCULAR | Status: DC
  Administered 2022-04-01 – 2022-04-02 (×4): 2 [IU] via SUBCUTANEOUS
  Filled 2022-03-31: qty 0.15

## 2022-03-31 MED ORDER — SODIUM CHLORIDE 0.9 % IV SOLN
2.0000 g | Freq: Three times a day (TID) | INTRAVENOUS | Status: DC
  Administered 2022-04-01 – 2022-04-03 (×7): 2 g via INTRAVENOUS
  Filled 2022-03-31 (×7): qty 12.5

## 2022-03-31 MED ORDER — FENTANYL CITRATE (PF) 100 MCG/2ML IJ SOLN
INTRAMUSCULAR | Status: AC
  Filled 2022-03-31: qty 2

## 2022-03-31 MED ORDER — NYSTATIN 100000 UNIT/GM EX POWD
Freq: Once | CUTANEOUS | Status: AC
  Filled 2022-03-31: qty 15

## 2022-03-31 MED ORDER — ACETAMINOPHEN 325 MG PO TABS
650.0000 mg | ORAL_TABLET | Freq: Once | ORAL | Status: AC
  Administered 2022-03-31: 650 mg via ORAL
  Filled 2022-03-31: qty 2

## 2022-03-31 MED ORDER — ACETAMINOPHEN 325 MG PO TABS: 650.0000 mg | ORAL_TABLET | Freq: Four times a day (QID) | ORAL | Status: DC | PRN

## 2022-03-31 MED ORDER — DEXAMETHASONE SODIUM PHOSPHATE 10 MG/ML IJ SOLN
INTRAMUSCULAR | Status: AC
  Filled 2022-03-31: qty 1

## 2022-03-31 MED ORDER — PROPOFOL 10 MG/ML IV BOLUS
INTRAVENOUS | Status: DC | PRN
  Administered 2022-03-31: 200 mg via INTRAVENOUS

## 2022-03-31 MED ORDER — IOHEXOL 300 MG/ML  SOLN
INTRAMUSCULAR | Status: DC | PRN
  Administered 2022-03-31: 8 mL via URETHRAL

## 2022-03-31 MED ORDER — LIDOCAINE 2% (20 MG/ML) 5 ML SYRINGE
INTRAMUSCULAR | Status: DC | PRN
  Administered 2022-03-31: 100 mg via INTRAVENOUS

## 2022-03-31 MED ORDER — ONDANSETRON HCL 4 MG PO TABS: 4.0000 mg | ORAL_TABLET | Freq: Four times a day (QID) | ORAL | Status: DC | PRN

## 2022-03-31 MED ORDER — 0.9 % SODIUM CHLORIDE (POUR BTL) OPTIME
TOPICAL | Status: DC | PRN
  Administered 2022-03-31: 1000 mL

## 2022-03-31 MED ORDER — LACTATED RINGERS IV SOLN: INTRAVENOUS | Status: DC

## 2022-03-31 MED ORDER — IOHEXOL 300 MG/ML  SOLN
100.0000 mL | Freq: Once | INTRAMUSCULAR | Status: AC | PRN
  Administered 2022-03-31: 100 mL via INTRAVENOUS

## 2022-03-31 MED ORDER — SODIUM CHLORIDE 0.9 % IV SOLN
2.0000 g | Freq: Once | INTRAVENOUS | Status: AC
  Administered 2022-03-31: 2 g via INTRAVENOUS
  Filled 2022-03-31: qty 12.5

## 2022-03-31 MED ORDER — ONDANSETRON HCL 4 MG/2ML IJ SOLN
INTRAMUSCULAR | Status: DC | PRN
  Administered 2022-03-31: 4 mg via INTRAVENOUS

## 2022-03-31 MED ORDER — VANCOMYCIN HCL 2000 MG/400ML IV SOLN
2000.0000 mg | Freq: Once | INTRAVENOUS | Status: AC
  Administered 2022-03-31: 2000 mg via INTRAVENOUS
  Filled 2022-03-31: qty 400

## 2022-03-31 MED ORDER — ONDANSETRON HCL 4 MG/2ML IJ SOLN
4.0000 mg | Freq: Four times a day (QID) | INTRAMUSCULAR | Status: DC | PRN
  Administered 2022-04-01 – 2022-04-02 (×2): 4 mg via INTRAVENOUS
  Filled 2022-03-31 (×2): qty 2

## 2022-03-31 MED ORDER — FENTANYL CITRATE (PF) 100 MCG/2ML IJ SOLN
INTRAMUSCULAR | Status: DC | PRN
  Administered 2022-03-31: 100 ug via INTRAVENOUS

## 2022-03-31 MED ORDER — ACETAMINOPHEN 650 MG RE SUPP: 650.0000 mg | Freq: Four times a day (QID) | RECTAL | Status: DC | PRN

## 2022-03-31 MED ORDER — SODIUM CHLORIDE 0.9 % IR SOLN
Status: DC | PRN
  Administered 2022-03-31: 3000 mL

## 2022-03-31 MED ORDER — LACTATED RINGERS IV BOLUS
2000.0000 mL | Freq: Once | INTRAVENOUS | Status: AC
  Administered 2022-03-31: 2000 mL via INTRAVENOUS

## 2022-03-31 SURGICAL SUPPLY — 15 items
BAG URO CATCHER STRL LF (MISCELLANEOUS) ×1 IMPLANT
CATH URETL OPEN 5X70 (CATHETERS) ×1 IMPLANT
CATH URETL OPEN END 6FR 70 (CATHETERS) IMPLANT
CLOTH BEACON ORANGE TIMEOUT ST (SAFETY) ×1 IMPLANT
GLOVE BIO SURGEON STRL SZ8 (GLOVE) IMPLANT
GLOVE BIOGEL PI IND STRL 8 (GLOVE) IMPLANT
GLOVE SURG LX STRL 7.5 STRW (GLOVE) ×1 IMPLANT
GOWN STRL REUS W/ TWL XL LVL3 (GOWN DISPOSABLE) ×1 IMPLANT
GOWN STRL REUS W/TWL XL LVL3 (GOWN DISPOSABLE) ×2
GUIDEWIRE ZIPWRE .038 STRAIGHT (WIRE) ×1 IMPLANT
MANIFOLD NEPTUNE II (INSTRUMENTS) ×1 IMPLANT
PACK CYSTO (CUSTOM PROCEDURE TRAY) ×1 IMPLANT
STENT URET 6FRX26 CONTOUR (STENTS) IMPLANT
TRAY FOLEY MTR SLVR 16FR STAT (SET/KITS/TRAYS/PACK) IMPLANT
TUBING CONNECTING 10 (TUBING) ×1 IMPLANT

## 2022-03-31 NOTE — H&P (Signed)
History and Physical    Patient: [name] VWU:[id number] DOB: [birth year] DOA: 03/31/2022 DOS: the patient was seen and examined on 04/01/2022 PCP: [name], MD  Patient coming from: Home - lives with  his wife and son. WC bound.    Chief Complaint: AMS  HPI: [name] is a 56 y.o. male with medical history significant of CHF, COPD, GERD, OSA, pulmonary HTN, alcohol use, chronic pain with intrathecal pump (no longer activated) and oral pain meds who presented to ED with AMS. He is alert and able to talk to me on exam. He states he has been getting more confused over the last several days and today he was really out of it so his wife called EMS. He denies any headache/vision changes, chest pain or palpitations, shortness of breath or cough, stomach pain, N/V/D. He states he has had some dysuria/frequency and straining to urinate. Also feels like he has increased left sided back pain from baseline. He is unsure about fevers, but has had chills.   His wife states he started to have acute confusion Saturday night and slept a lot and then it was severe today so she called EMS.   He does not smoke, drinks liquor but not daily. Denies any history of withdrawal.   ER Course:  vitals: temp: 102.7, bp: 130/82, HR; 107, RR: 28, oxygen 96% on RA Pertinent labs: flu/covid negative, wbc: 11.5, hgb: 12.7, arterial blood gas: pH-7.54 CXR: hypoinflation without acute cardiopulm process CT abdo/pelvis: 6mm calculus in distal left ureter with mild obstructive uropathy. Additional non obstructing bilateral renal calculi. Mild bladder wall thickening with surrounding inflammation concerning for cystitis. Also mild inflammatory stranding surrounding the distal ureters bilaterally which may be related to infection.  CT head: no acute finding In ED: sepsis protocol activated. Bolused 2L and started on IVF and broad spectrum abx. [facility] asked to admit.    Review of Systems: per HPI otherwise negative.   Past Medical History:  Diagnosis Date   Arthritis    Asthma    as child   Back pain    CHF (congestive heart failure) (HCC)    COPD (chronic obstructive pulmonary disease) (HCC)    GERD (gastroesophageal reflux disease)    Hernia    History of hiatal hernia    Hypogonadism in male    Neuromuscular disorder (HCC)    back injury with surgery   PONV (postoperative nausea and vomiting)    gets nausea   Shortness of breath dyspnea    Sleep apnea    Does not use CPAP, new equipment 12/2014   Ulcer    Wears glasses    Past Surgical History:  Procedure Laterality Date   BACK SURGERY     JOINT REPLACEMENT     Left knee replacement   LAPAROSCOPIC GASTRIC SLEEVE RESECTION N/A 01/08/2015   Procedure: LAPAROSCOPIC GASTRIC SLEEVE RESECTION;  Surgeon: Luretha Murphy, MD;  Location: [facility];  Service: General;  Laterality: N/A;   morphine pump     RIGHT HEART CATH N/A 09/22/2019   Procedure: RIGHT HEART CATH;  Surgeon: Dolores Patty, MD;  Location: [facility];  Service: Cardiovascular;  Laterality: N/A;   SHOULDER ARTHROSCOPY WITH SUBACROMIAL DECOMPRESSION AND BICEP TENDON REPAIR Left 10/06/2013   Procedure: LEFT SHOULDER ARTHROSCOPY DEBRIDEMENT EXTENTSIVE,DISTAL CLAVICULECTOMY,DECOMPRESSION SUBACROMIAL PARTIAL ACROMIOPLASTY WITH ROTATOR CUFF REPAIR, and excision of CALCIUM DEPOSIT;  Surgeon: Loreta Ave, MD;  Location: [facility];  Service: Orthopedics;  Laterality: Left;  SHOULDER ARTHROSCOPY WITH SUBACROMIAL DECOMPRESSION, ROTATOR CUFF REPAIR AND BICEP TENDON REPAIR Right 08/18/2013   Procedure: RIGHT SHOULDER ARTHROSCOPY WITH SUBACROMIAL DECOMPRESSION/PARTIAL ACROMIOPLASTY WITH CORACOACROMIAL RELEASE/DISTAL CLAVICULECTOMY/ ROTATOR CUFF REPAIR/DEBRIDEMENT EXTENTSIVE;  Surgeon: Loreta Ave, MD;  Location: [facility];  Service: Orthopedics;  Laterality: Right;  ANESTHESIA: GENERAL, PRE/POST OP SCALENE   TONSILLECTOMY     Social History:  reports  that he has never smoked. His smokeless tobacco use includes snuff and chew. He reports current alcohol use. He reports that he does not use drugs.  Allergies  Allergen Reactions   Tape Other (See Comments)    Blisters - tolerates paper tape well    Family History  Problem Relation Age of Onset   Diabetes Mother    Pneumonia Father     Prior to Admission medications   Medication Sig Start Date End Date Taking? Authorizing Provider  acetaminophen (TYLENOL) 500 MG tablet Take 1,000 mg by mouth every 6 (six) hours as needed for mild pain or moderate pain.     [provider]  aspirin EC 81 MG tablet Take 81 mg by mouth daily.    [provider]  calcium carbonate (CALCIUM 600) 600 MG TABS tablet Take 1 tablet (600 mg total) by mouth 2 (two) times daily with a meal. 12/27/18 05/14/21  [name], MD  Cholecalciferol (VITAMIN D3) 125 MCG (5000 UT) CAPS Take 1 capsule (5,000 Units total) by mouth daily with breakfast. Take along with calcium and magnesium. Patient taking differently: Take 5,000 Units by mouth daily with breakfast. Take along with calcium and magnesium. 12/27/18 05/14/21  [name], MD  clotrimazole-betamethasone (LOTRISONE) cream Apply to affected area 2 times day for one week 10/31/21   Bethann Berkshire, MD  cyclobenzaprine (FLEXERIL) 10 MG tablet Take 1 tablet (10 mg total) by mouth at bedtime. Patient taking differently: Take 10 mg by mouth daily as needed for muscle spasms. 10/20/19 10/31/21  [name], MD  DULoxetine (CYMBALTA) 60 MG capsule Take 60 mg by mouth daily.    [provider]  escitalopram (LEXAPRO) 20 MG tablet Take 20 mg by mouth daily. 05/31/19   [provider]  esomeprazole (NEXIUM) 40 MG capsule Take 1 capsule (40 mg total) by mouth daily. Patient taking differently: Take 40 mg by mouth 2 (two) times daily before a meal. 12/27/18 10/31/21  [name], MD  gabapentin (NEURONTIN) 600 MG tablet Take 600  mg by mouth in the morning, at noon, in the evening, and at bedtime.    [provider]  HYDROmorphone (DILAUDID) 4 MG tablet Take 1 tablet (4 mg total) by mouth every 6 (six) hours as needed for severe pain. Must last 30 days. 01/14/22 02/13/22  [name], MD  ibuprofen (ADVIL) 200 MG tablet Take 400 mg by mouth every 6 (six) hours as needed for mild pain or moderate pain.    [provider]  morphine (MS CONTIN) 30 MG 12 hr tablet Take 1 tablet (30 mg total) by mouth every 12 (twelve) hours. Must last 30 days. Do not break tablet 01/14/22 02/13/22  [name], MD  naloxone [facility]) nasal spray 4 mg/0.1 mL Place 1 spray into the nose as needed for up to 365 doses (for opioid-induced respiratory depresssion). In case of emergency (overdose), spray once into each nostril. If no response within 3 minutes, repeat application and call 911. 11/21/21 11/21/22  [name], MD  NON FORMULARY 1,395.2 mcg by Intrathecal route daily. IT pump Fentanyl 2,000.0 mcg/ml  Bupivicaine 20.0 mg/ml Baclofen 200.0 mcg/ml 24 dose 1395.2 mcg/day 40 ml pump    [provider]  ondansetron (ZOFRAN-ODT) 4 MG disintegrating tablet 4mg  ODT q4 hours prn nausea/vomit 10/31/21   Bethann Berkshire, MD  promethazine (PHENERGAN) 25 MG tablet Take 25-50 mg by mouth every 6 (six) hours as needed for nausea or vomiting.  05/26/19   [provider]  tiZANidine (ZANAFLEX) 4 MG tablet Take 1 tablet (4 mg total) by mouth every 8 (eight) hours as needed for muscle spasms. 10/20/19 10/31/21  [name], MD  torsemide (DEMADEX) 20 MG tablet Take 2 tablets (40 mg total) by mouth daily. 09/14/19   Allayne Butcher, PA-C    Physical Exam: Vitals:   04/01/22 0015 04/01/22 0030 04/01/22 0045 04/01/22 0105  BP: 129/73 122/75 124/81 132/80  Pulse: 92 90 91 90  Resp: (!) 26 (!) 25 (!) 30 20  Temp:    99.3 F (37.4 C)  TempSrc:    Oral  SpO2: 94% 97% 98% 97%   General:  Appears calm and  comfortable and is in NAD, morbidly obese, diaphoretic.  Eyes:  PERRL, EOMI, normal lids, iris ENT:  grossly normal hearing, lips & tongue, dry mucous membranes; appropriate dentition Neck:  no LAD, masses or thyromegaly; no carotid bruits Cardiovascular:  RRR, no m/r/g. No LE edema.  Respiratory:   CTA bilaterally with no wheezes/rales/rhonchi.  Normal respiratory effort. Abdomen:  soft, NT, ND, NABS Back:   normal alignment, can not test CVA tenderness due to weakness/body habitus.  Skin:  intertriginous yeast in pannus  Musculoskeletal:  grossly normal tone BUE. Profoundly weak in BLE, baseline., good ROM, no bony abnormality Lower extremity:  No LE edema.  Limited foot exam with no ulcerations.  2+ distal pulses. Psychiatric:  grossly normal mood and affect, speech fluent and appropriate, AOx2 Neurologic:  CN 2-12 grossly intact, moves all extremities in coordinated fashion, sensation intact   Radiological Exams on Admission: Independently reviewed - see discussion in A/P where applicable  DG C-Arm 1-60 Min-No Report  Result Date: 04/01/2022 Fluoroscopy was utilized by the requesting physician.  No radiographic interpretation.   CT ABDOMEN PELVIS W CONTRAST  Result Date: 03/31/2022 CLINICAL DATA:  Abdominal pain. EXAM: CT ABDOMEN AND PELVIS WITH CONTRAST TECHNIQUE: Multidetector CT imaging of the abdomen and pelvis was performed using the standard protocol following bolus administration of intravenous contrast. RADIATION DOSE REDUCTION: This exam was performed according to the departmental dose-optimization program which includes automated exposure control, adjustment of the mA and/or kV according to patient size and/or use of iterative reconstruction technique. CONTRAST:  OMNIPAQUE IOHEXOL 300 MG/ML  SOLN COMPARISON:  CT abdomen and pelvis 06/10/2018 FINDINGS: Lower chest: No acute abnormality. Hepatobiliary: The liver is enlarged. There is diffuse fatty infiltration of the  liver. The gallbladder bile ducts are within normal limits. Pancreas: Unremarkable. No pancreatic ductal dilatation or surrounding inflammatory changes. Spleen: Normal in size without focal abnormality. Adrenals/Urinary Tract: There is a 6 mm calculus in the distal left ureter. There is mild left-sided hydroureteronephrosis and mild left perinephric fat stranding. There are additional nonobstructing bilateral renal calculi; the largest is in the right kidney measuring 11 mm. There is no right-sided hydronephrosis. There is mild bladder wall thickening with surrounding inflammation. There is also mild inflammatory stranding surrounding the bilateral distal ureters. The adrenal glands are within normal limits. Stomach/Bowel: Stomach is within normal limits. No evidence of bowel wall thickening, distention, or inflammatory changes. There are postsurgical changes in the  stomach. Appendix is not visualized. Vascular/Lymphatic: No significant vascular findings are present. No enlarged abdominal or pelvic lymph nodes. Reproductive: Prostate is unremarkable. Other: No abdominal wall hernia or abnormality. No abdominopelvic ascites. Musculoskeletal: Postsurgical changes/disc spaces are seen at L3-L4 and L4-L5. thoracic spinal cord stimulator device again seen. IMPRESSION: 1. 6 mm calculus in the distal left ureter with mild obstructive uropathy. 2. Additional nonobstructing bilateral renal calculi. 3. Mild bladder wall thickening with surrounding inflammation concerning for cystitis. There is also mild inflammatory stranding surrounding the distal ureters bilaterally which may be related to infection. 4. Hepatomegaly and hepatic steatosis. Electronically Signed   By: Darliss Cheney M.D.   On: 03/31/2022 20:59   CT Head Wo Contrast  Result Date: 03/31/2022 CLINICAL DATA:  Altered mental status.  Headache and fever. EXAM: CT HEAD WITHOUT CONTRAST TECHNIQUE: Contiguous axial images were obtained from the base of the skull  through the vertex without intravenous contrast. RADIATION DOSE REDUCTION: This exam was performed according to the departmental dose-optimization program which includes automated exposure control, adjustment of the mA and/or kV according to patient size and/or use of iterative reconstruction technique. COMPARISON:  CT head dated Aug 25, 2019 FINDINGS: Brain: No evidence of acute infarction, hemorrhage, hydrocephalus, extra-axial collection or mass lesion/mass effect. Vascular: No hyperdense vessel or unexpected calcification. Skull: Normal. Negative for fracture or focal lesion. Sinuses/Orbits: No acute finding. Other: None. IMPRESSION: No acute intracranial pathology. Electronically Signed   By: Larose Hires D.O.   On: 03/31/2022 20:59   DG Chest Port 1 View  Result Date: 03/31/2022 CLINICAL DATA:  Possible sepsis. EXAM: PORTABLE CHEST 1 VIEW COMPARISON:  08/24/2019 FINDINGS: Lungs are somewhat hypoinflated demonstrate no focal airspace consolidation or effusion. Mild stable cardiomegaly. Remainder of the exam is unchanged. IMPRESSION: 1. Hypoinflation without acute cardiopulmonary disease. 2. Mild stable cardiomegaly. Electronically Signed   By: Elberta Fortis M.D.   On: 03/31/2022 16:56    EKG: Independently reviewed.  Sinus tachycardia with rate 110; nonspecific ST changes with no evidence of acute ischemia   Labs on Admission: I have personally reviewed the available labs and imaging studies at the time of the admission.  Pertinent labs:   flu/covid negative,  wbc: 11.5,  hgb: 12.7,  arterial blood gas: pH-7.54  Assessment and Plan: Principal Problem:   Acute encephalopathy Active Problems:   Sepsis secondary urosepsis/stone   Candidal intertrigo   Chronic pain syndrome   Type 2 diabetes mellitus with complications (HCC)   Chronic diastolic heart failure (HCC)   Chronic respiratory failure (HCC)   OSA complicated by mild polycythemia    Presence of intrathecal pump ([employer])     Assessment and Plan: * Acute encephalopathy 56 year old male presenting with 3 day history of worsening confusion likely secondary to urosepsis. Mental status improved on my exam and alert and oriented and carrying on normal conversation  -admit to progressive -sepsis criteria met with fever, tachycardia and tachypnea in setting of likely UTI with obstructed stone. UA still not collected. CT showing 6mm calculus in distal ureter with mild obstructive uropathy.  -urology consulted with plans for stent tonight -cultures pending -continue cefepime for now -metabolic labs pending -CT head negative  -continue gentle, time limited IVF   Sepsis secondary urosepsis/stone Urology consulted and going for stent placement tonight Continue cefepime Follow  urine culture Resume home pain medication, did write for dilaudid IV one dose.  Follow urology recs   Candidal intertrigo Nystatin powder BID Hygiene is a huge issue. His  wife cares for him and has a hard time keeping him clean and dry.  PT, ? Rehab?   Chronic pain syndrome Will continue home medication when able to tolerate PO -lyrica 150mg  TID -morphine (MS contin) 30mg  BID -dilaudid 4mg  q 6 hours PRN severe pain -tizanidine   Type 2 diabetes mellitus with complications (HCC) Recent A1C of 5.3, diet controlled Accuchecks q 4 hours while NPO and SSI    Chronic diastolic heart failure (HCC) Appears euvolemic Strict I/O  Echo in 2021: EF 60-65% with normal LVF. Normal diastolic function.  Demedex prn   Chronic respiratory failure (HCC) On 3-4L at baseline, stable.   OSA complicated by mild polycythemia  Does not use cpap machine due to mask intolerance  Arterial pH with no co2 retention contributing to his AMS  Continue oxygen [place]   Presence of intrathecal pump ([employer]) No longer working. Getting work up before pain management replaces      Advance Care Planning:   Code Status: Full Code discussed with patient    Consults: urology/PT   DVT Prophylaxis: SCDs  Family Communication: updated wife by phone. Debbie Wanamaker   Severity of Illness: The appropriate patient status for this patient is INPATIENT. Inpatient status is judged to be reasonable and necessary in order to provide the required intensity of service to ensure the patient's safety. The patient's presenting symptoms, physical exam findings, and initial radiographic and laboratory data in the context of their chronic comorbidities is felt to place them at high risk for further clinical deterioration. Furthermore, it is not anticipated that the patient will be medically stable for discharge from the hospital within 2 midnights of admission.   * I certify that at the point of admission it is my clinical judgment that the patient will require inpatient hospital care spanning beyond 2 midnights from the point of admission due to high intensity of service, high risk for further deterioration and high frequency of surveillance required.*  Author: Orland Mustard, MD 04/01/2022 1:48 AM  For on call review [email or web address].

## 2022-03-31 NOTE — ED Provider Notes (Signed)
[facility]-EMERGENCY DEPT Provider Note   CSN: [id number] Arrival date & time: 03/31/22  1601     History  Chief Complaint  Patient presents with   Altered Mental Status    [name] is a 56 y.o. male who presents emergency department with altered mental status.  He has an extensive past medical history included morbid obesity, wheelchair-bound, chronic pain syndrome on opiate therapy, history of alcoholism and alcohol dependence, status post intrathecal pump for neurogenic back pain.  He has had worsening mental status for the past several days according to EMS.  He was notably tachypneic and tachycardic.  They gave him 800 mL of fluid prior to arrival.  Patient is disoriented.  He does complain of pain in his abdomen.  The history is provided by the patient. No language interpreter was used.  Altered Mental Status Presenting symptoms: disorientation and lethargy        Home Medications Prior to Admission medications   Medication Sig Start Date End Date Taking? Authorizing Provider  acetaminophen (TYLENOL) 500 MG tablet Take 1,000 mg by mouth every 6 (six) hours as needed for mild pain or moderate pain.     [provider]  aspirin EC 81 MG tablet Take 81 mg by mouth daily.    [provider]  calcium carbonate (CALCIUM 600) 600 MG TABS tablet Take 1 tablet (600 mg total) by mouth 2 (two) times daily with a meal. 12/27/18 05/14/21  [name], MD  Cholecalciferol (VITAMIN D3) 125 MCG (5000 UT) CAPS Take 1 capsule (5,000 Units total) by mouth daily with breakfast. Take along with calcium and magnesium. Patient taking differently: Take 5,000 Units by mouth daily with breakfast. Take along with calcium and magnesium. 12/27/18 05/14/21  [name], MD  clotrimazole-betamethasone (LOTRISONE) cream Apply to affected area 2 times day for one week 10/31/21   Bethann Berkshire, MD  cyclobenzaprine (FLEXERIL) 10 MG tablet Take 1 tablet (10  mg total) by mouth at bedtime. Patient taking differently: Take 10 mg by mouth daily as needed for muscle spasms. 10/20/19 10/31/21  [name], MD  DULoxetine (CYMBALTA) 60 MG capsule Take 60 mg by mouth daily.    [provider]  escitalopram (LEXAPRO) 20 MG tablet Take 20 mg by mouth daily. 05/31/19   [provider]  esomeprazole (NEXIUM) 40 MG capsule Take 1 capsule (40 mg total) by mouth daily. Patient taking differently: Take 40 mg by mouth 2 (two) times daily before a meal. 12/27/18 10/31/21  [name], MD  gabapentin (NEURONTIN) 600 MG tablet Take 600 mg by mouth in the morning, at noon, in the evening, and at bedtime.    [provider]  HYDROmorphone (DILAUDID) 4 MG tablet Take 1 tablet (4 mg total) by mouth every 6 (six) hours as needed for severe pain. Must last 30 days. 01/14/22 02/13/22  [name], MD  ibuprofen (ADVIL) 200 MG tablet Take 400 mg by mouth every 6 (six) hours as needed for mild pain or moderate pain.    [provider]  morphine (MS CONTIN) 30 MG 12 hr tablet Take 1 tablet (30 mg total) by mouth every 12 (twelve) hours. Must last 30 days. Do not break tablet 01/14/22 02/13/22  [name], MD  naloxone [facility]) nasal spray 4 mg/0.1 mL Place 1 spray into the nose as needed for up to 365 doses (for opioid-induced respiratory depresssion). In case of emergency (overdose), spray once into each nostril. If no response within 3 minutes,  repeat application and call 911. 11/21/21 11/21/22  [name], MD  NON FORMULARY 1,395.2 mcg by Intrathecal route daily. IT pump Fentanyl 2,000.0 mcg/ml Bupivicaine 20.0 mg/ml Baclofen 200.0 mcg/ml 24 dose 1395.2 mcg/day 40 ml pump    [provider]  ondansetron (ZOFRAN-ODT) 4 MG disintegrating tablet 4mg  ODT q4 hours prn nausea/vomit 10/31/21   Bethann Berkshire, MD  promethazine (PHENERGAN) 25 MG tablet Take 25-50 mg by mouth every 6 (six) hours as needed for nausea or  vomiting.  05/26/19   [provider]  tiZANidine (ZANAFLEX) 4 MG tablet Take 1 tablet (4 mg total) by mouth every 8 (eight) hours as needed for muscle spasms. 10/20/19 10/31/21  [name], MD  torsemide (DEMADEX) 20 MG tablet Take 2 tablets (40 mg total) by mouth daily. 09/14/19   Allayne Butcher, PA-C      Allergies    Tape    Review of Systems   Review of Systems  Physical Exam Updated Vital Signs There were no vitals taken for this visit. Physical Exam Vitals and nursing note reviewed.  Constitutional:      General: He is not in acute distress.    Appearance: He is well-developed. He is obese. He is ill-appearing. He is not diaphoretic.     Comments: Patient appears than stated age, disheveled.  He is unkempt with soiled sheets and blankets.  There is a solid in the bed which appears to be rotten  HENT:     Head: Normocephalic and atraumatic.  Eyes:     General: No scleral icterus.    Extraocular Movements: Extraocular movements intact.     Conjunctiva/sclera: Conjunctivae normal.     Pupils: Pupils are equal, round, and reactive to light.  Cardiovascular:     Rate and Rhythm: Regular rhythm. Tachycardia present.     Heart sounds: Normal heart sounds.  Pulmonary:     Effort: Pulmonary effort is normal. Tachypnea present. No respiratory distress.     Breath sounds: Normal breath sounds.  Abdominal:     Palpations: Abdomen is soft.     Tenderness: There is abdominal tenderness.     Comments: Significant intertrigo.  Right lower quadrant of the abdomen is red, tender to palpation with pitting edema noted.  Patient has skin changes of chronic tinea cruris, skin is red and erythematous. Abdomen is tender to palpation however exam is limited due to obese abdomen.  Musculoskeletal:     Cervical back: Normal range of motion and neck supple.  Skin:    General: Skin is warm and dry.  Neurological:     Mental Status: He is alert. He is disoriented.     GCS: GCS  eye subscore is 4. GCS verbal subscore is 4. GCS motor subscore is 6.  Psychiatric:        Behavior: Behavior normal.     ED Results / Procedures / Treatments   Labs (all labs ordered are listed, but only abnormal results are displayed) Labs Reviewed  RESP PANEL BY RT-PCR (RSV, FLU A&B, COVID)  [id number]  CULTURE, BLOOD (ROUTINE X 2)  CULTURE, BLOOD (ROUTINE X 2)  LACTIC ACID, PLASMA  LACTIC ACID, PLASMA  COMPREHENSIVE METABOLIC PANEL  CBC WITH DIFFERENTIAL/PLATELET  PROTIME-INR  APTT  URINALYSIS, ROUTINE W REFLEX MICROSCOPIC  AMMONIA  BLOOD GAS, ARTERIAL  ETHANOL  RAPID URINE DRUG SCREEN, HOSP PERFORMED    EKG None  Radiology No results found.  Procedures .Critical Care  Performed by: Arthor Captain, PA-C Authorized by: Arthor Captain,  PA-C   Critical care provider statement:    Critical care time (minutes):  60   Critical care time was exclusive of:  Separately billable procedures and treating other patients   Critical care was necessary to treat or prevent imminent or life-threatening deterioration of the following conditions:  Sepsis and CNS failure or compromise   Critical care was time spent personally by me on the following activities:  Development of treatment plan with patient or surrogate, discussions with consultants, evaluation of patient's response to treatment, examination of patient, ordering and review of laboratory studies, ordering and review of radiographic studies, ordering and performing treatments and interventions, pulse oximetry, re-evaluation of patient's condition and review of old charts     Medications Ordered in ED Medications  lactated ringers infusion (has no administration in time range)  ceFEPIme (MAXIPIME) 2 g in sodium chloride 0.9 % 100 mL IVPB (has no administration in time range)  metroNIDAZOLE (FLAGYL) IVPB 500 mg (has no administration in time range)  vancomycin (VANCOCIN) IVPB 1000 mg/200 mL premix (has no administration in  time range)    ED Course/ Medical Decision Making/ A&P Clinical Course as of 04/01/22 1006  Mon Mar 31, 2022  1717 WBC(!): 11.5 [AH]  1804 Glucose(!): 162 [AH]  1804 CT ABDOMEN PELVIS W CONTRAST [AH]  1900 pH, Arterial(!): 7.54 [AH]  1900 Glucose(!): 162 [AH]  1900 Creatinine: 1.13 [AH]  1900 BUN: 15 [AH]    Clinical Course User Index [AH] Arthor Captain, PA-C                           Medical Decision Making This patient presents to the ED for concern of ams, this involves an extensive number of treatment options, and is a complaint that carries with it a high risk of complications and morbidity.  The differential diagnosis for AMS is extensive and includes, but is not limited to: drug overdose - opioids, alcohol, sedatives, antipsychotics, drug withdrawal, others; Metabolic: hypoxia, hypoglycemia, hyperglycemia, hypercalcemia, hypernatremia, hyponatremia, uremia, hepatic encephalopathy, hypothyroidism, hyperthyroidism, vitamin B12 or thiamine deficiency, carbon monoxide poisoning, Wilson's disease, Lactic acidosis, DKA/HHOS; Infectious: meningitis, encephalitis, bacteremia/sepsis, urinary tract infection, pneumonia, neurosyphilis; Structural: Space-occupying lesion, (brain tumor, subdural hematoma, hydrocephalus,); Vascular: stroke, subarachnoid hemorrhage, coronary ischemia, hypertensive encephalopathy, CNS vasculitis, thrombotic thrombocytopenic purpura, disseminated intravascular coagulation, hyperviscosity; Psychiatric: Schizophrenia, depression; Other: Seizure, hypothermia, heat stroke, ICU psychosis, dementia -"sundowning."    After review of all data points patient appears to have sepsis likely due to urinary source.  He also appears to have hydronephrosis and an obstructive ureteral stone.     Co morbidities that complicate the patient evaluation       Morbid obesity, chronic opiate use, intrathecal pump, bedbound   Additional history obtained:  {Additional history  obtained from EMR, MS and bedside    Lab Tests:  I Ordered, and personally interpreted labs.  The pertinent results include: Leukocytosis, elevated blood glucose of 162.   Imaging Studies ordered:  I ordered imaging studies including CT abdomen and pelvis which shows 6 mm calculus of the distal left ureter with obstruction and bladder wall thickening consistent with cystitis, CT head shows no abnormalities and portable view chest x-ray shows hypoinflation. I independently visualized and interpreted imaging I agree with the radiologist interpretation   Cardiac Monitoring/ECG:       The patient was maintained on a cardiac monitor.  I personally viewed and interpreted the cardiac monitored which showed an underlying rhythm of:  Medicines ordered and prescription drug management:  I ordered medication including Medications Cefepime, vancomycin, Flagyl for sepsis and Tylenol for fever for  Reevaluation of the patient after these medicines showed that the patient improved I have reviewed the patients home medicines and have made adjustments as needed   I considered lumbar puncture however sepsis appears to have a source Critical Interventions:       Rectum antibiotics   Consultations Obtained:  {I requested consultation with the Dr. Liliane Shi urology,  and discussed lab and imaging findings as well as pertinent plan - they recommend: Admission and stent placement {I discussed the case with hospitalist Dr. Artis Flock, they will admit the patient   Problem List / ED Course:       (G93.40) Acute encephalopathy  (primary encounter diagnosis)     Reevaluation:  After the interventions noted above, I reevaluated the patient and found that they have :improved   Social Determinants of Health:       Bedbound, morbid obesity   Dispostion:  After consideration of the diagnostic results and the patients response to treatment, I feel that the patent would benefit from  admission    Amount and/or Complexity of Data Reviewed Labs: ordered. Decision-making details documented in ED Course. Radiology: ordered. Decision-making details documented in ED Course. ECG/medicine tests: ordered.  Risk OTC drugs. Prescription drug management. Decision regarding hospitalization.           Final Clinical Impression(s) / ED Diagnoses Final diagnoses:  None    Rx / DC Orders ED Discharge Orders     None         Arthor Captain, PA-C 04/01/22 2237    Gloris Manchester, MD 04/02/22 0110

## 2022-03-31 NOTE — Assessment & Plan Note (Addendum)
With improving mentation patient's home regimen of chronic analgesics have been resumed  lyrica 150mg  TID morphine (MS contin) 30mg  BID dilaudid 4mg  q 6 hours PRN severe pain tizanidine

## 2022-03-31 NOTE — Consult Note (Signed)
Urology Consult   Physician requesting consult: Orland Mustard, MD  Reason for consult: Obstructing left ureteral stone with acute encephalopathy  History of Present Illness: [name] is a 56 y.o. male who presents to the [facility] emergency department with a 24-hour history of altered mental status and left lower quadrant pain.  He has no family at bedside and so the remainder of his history was obtained from his chart.  He had a CT stone study this evening that revealed an obstructing left distal ureteral calculus along with bilateral nonobstructing renal stones.  He was found to have a fever of 102.7 F this evening with tachycardia and tachypnea.  The patient denies a history of voiding or storage urinary symptoms, hematuria, UTIs, STDs, urolithiasis, GU malignancy/trauma/surgery.  Past Medical History:  Diagnosis Date   Arthritis    Asthma    as child   Back pain    CHF (congestive heart failure) (HCC)    COPD (chronic obstructive pulmonary disease) (HCC)    GERD (gastroesophageal reflux disease)    Hernia    History of hiatal hernia    Hypogonadism in male    Neuromuscular disorder (HCC)    back injury with surgery   PONV (postoperative nausea and vomiting)    gets nausea   Shortness of breath dyspnea    Sleep apnea    Does not use CPAP, new equipment 12/2014   Ulcer    Wears glasses     Past Surgical History:  Procedure Laterality Date   BACK SURGERY     JOINT REPLACEMENT     Left knee replacement   LAPAROSCOPIC GASTRIC SLEEVE RESECTION N/A 01/08/2015   Procedure: LAPAROSCOPIC GASTRIC SLEEVE RESECTION;  Surgeon: Luretha Murphy, MD;  Location: [facility];  Service: General;  Laterality: N/A;   morphine pump     RIGHT HEART CATH N/A 09/22/2019   Procedure: RIGHT HEART CATH;  Surgeon: Dolores Patty, MD;  Location: [facility];  Service: Cardiovascular;  Laterality: N/A;   SHOULDER ARTHROSCOPY WITH SUBACROMIAL DECOMPRESSION AND BICEP TENDON REPAIR Left  10/06/2013   Procedure: LEFT SHOULDER ARTHROSCOPY DEBRIDEMENT EXTENTSIVE,DISTAL CLAVICULECTOMY,DECOMPRESSION SUBACROMIAL PARTIAL ACROMIOPLASTY WITH ROTATOR CUFF REPAIR, and excision of CALCIUM DEPOSIT;  Surgeon: Loreta Ave, MD;  Location: [facility];  Service: Orthopedics;  Laterality: Left;   SHOULDER ARTHROSCOPY WITH SUBACROMIAL DECOMPRESSION, ROTATOR CUFF REPAIR AND BICEP TENDON REPAIR Right 08/18/2013   Procedure: RIGHT SHOULDER ARTHROSCOPY WITH SUBACROMIAL DECOMPRESSION/PARTIAL ACROMIOPLASTY WITH CORACOACROMIAL RELEASE/DISTAL CLAVICULECTOMY/ ROTATOR CUFF REPAIR/DEBRIDEMENT EXTENTSIVE;  Surgeon: Loreta Ave, MD;  Location: [facility];  Service: Orthopedics;  Laterality: Right;  ANESTHESIA: GENERAL, PRE/POST OP SCALENE   TONSILLECTOMY      Current Hospital Medications:  Home Meds:  No outpatient medications have been marked as taking for the 03/31/22 encounter [facility] Encounter).    Scheduled Meds:  [START ON 04/01/2022] insulin aspart  0-15 Units Subcutaneous TID WC   pregabalin  150 mg Oral TID   Continuous Infusions:  [START ON 04/01/2022] ceFEPime (MAXIPIME) IV     lactated ringers 150 mL/hr at 03/31/22 2252   PRN Meds:.acetaminophen **OR** acetaminophen, HYDROmorphone (DILAUDID) injection, ondansetron **OR** ondansetron (ZOFRAN) IV  Allergies:  Allergies  Allergen Reactions   Tape Other (See Comments)    Blisters - tolerates paper tape well    Family History  Problem Relation Age of Onset   Diabetes Mother    Pneumonia Father     Social History:  reports that he has never  smoked. His smokeless tobacco use includes snuff and chew. He reports current alcohol use. He reports that he does not use drugs.  ROS: A complete review of systems was performed.  All systems are negative except for pertinent findings as noted.  Physical Exam:  Vital signs in last 24 hours: Temp:  [98 F (36.7 C)-102.7 F (39.3 C)] 98 F (36.7 C) (12/18  2251) Pulse Rate:  [82-109] 95 (12/18 2251) Resp:  [20-35] 20 (12/18 2251) BP: (96-152)/(64-88) 132/85 (12/18 2251) SpO2:  [95 %-100 %] 99 % (12/18 2230) Constitutional:  Alert but disoriented.  Morbidly obese Respiratory: Labored breathing with supplemental oxygen GI: Morbidly obese.  Abdomen is soft, nontender, nondistended, no abdominal masses   Laboratory Data:  Recent Labs    03/31/22 1631 03/31/22 1649  WBC 11.5*  --   HGB 12.7* 13.3  HCT 39.5 39.0  PLT 271  --     Recent Labs    03/31/22 1631 03/31/22 1649  NA 135 134*  K 3.7 3.9  CL 98 95*  GLUCOSE 162* 165*  BUN 15 15  CALCIUM 8.8*  --   CREATININE 1.13 1.00     Results for orders placed or performed during the hospital encounter of 03/31/22 (from the past 24 hour(s))  Resp panel by RT-PCR (RSV, Flu A&B, Covid) Anterior Nasal Swab     Status: None   Collection Time: 03/31/22  4:21 PM   Specimen: Anterior Nasal Swab  Result Value Ref Range   SARS Coronavirus 2 by RT PCR NEGATIVE NEGATIVE   Influenza A by PCR NEGATIVE NEGATIVE   Influenza B by PCR NEGATIVE NEGATIVE   Resp Syncytial Virus by PCR NEGATIVE NEGATIVE  Lactic acid, plasma     Status: None   Collection Time: 03/31/22  4:25 PM  Result Value Ref Range   Lactic Acid, Venous 1.8 0.5 - 1.9 mmol/L  Ethanol     Status: None   Collection Time: 03/31/22  4:25 PM  Result Value Ref Range   Alcohol, Ethyl (B) <10 <10 mg/dL  Comprehensive metabolic panel     Status: Abnormal   Collection Time: 03/31/22  4:31 PM  Result Value Ref Range   Sodium 135 135 - 145 mmol/L   Potassium 3.7 3.5 - 5.1 mmol/L   Chloride 98 98 - 111 mmol/L   CO2 26 22 - 32 mmol/L   Glucose, Bld 162 (H) 70 - 99 mg/dL   BUN 15 6 - 20 mg/dL   Creatinine, Ser 7.32 0.61 - 1.24 mg/dL   Calcium 8.8 (L) 8.9 - 10.3 mg/dL   Total Protein 8.1 6.5 - 8.1 g/dL   Albumin 2.8 (L) 3.5 - 5.0 g/dL   AST 40 15 - 41 U/L   ALT 26 0 - 44 U/L   Alkaline Phosphatase 70 38 - 126 U/L   Total Bilirubin  0.7 0.3 - 1.2 mg/dL   GFR, Estimated >20 >25 mL/min   Anion gap 11 5 - 15  CBC with Differential     Status: Abnormal   Collection Time: 03/31/22  4:31 PM  Result Value Ref Range   WBC 11.5 (H) 4.0 - 10.5 K/uL   RBC 4.22 4.22 - 5.81 MIL/uL   Hemoglobin 12.7 (L) 13.0 - 17.0 g/dL   HCT 42.7 06.2 - 37.6 %   MCV 93.6 80.0 - 100.0 fL   MCH 30.1 26.0 - 34.0 pg   MCHC 32.2 30.0 - 36.0 g/dL   RDW 28.3 15.1 - 76.1 %  Platelets 271 150 - 400 K/uL   nRBC 0.0 0.0 - 0.2 %   Neutrophils Relative % 82 %   Neutro Abs 9.4 (H) 1.7 - 7.7 K/uL   Lymphocytes Relative 11 %   Lymphs Abs 1.3 0.7 - 4.0 K/uL   Monocytes Relative 6 %   Monocytes Absolute 0.7 0.1 - 1.0 K/uL   Eosinophils Relative 0 %   Eosinophils Absolute 0.0 0.0 - 0.5 K/uL   Basophils Relative 0 %   Basophils Absolute 0.0 0.0 - 0.1 K/uL   Immature Granulocytes 1 %   Abs Immature Granulocytes 0.07 0.00 - 0.07 K/uL  Protime-INR     Status: None   Collection Time: 03/31/22  4:31 PM  Result Value Ref Range   Prothrombin Time 14.2 11.4 - 15.2 seconds   INR 1.1 0.8 - 1.2  APTT     Status: None   Collection Time: 03/31/22  4:31 PM  Result Value Ref Range   aPTT 32 24 - 36 seconds  Ammonia     Status: None   Collection Time: 03/31/22  4:41 PM  Result Value Ref Range   Ammonia 27 9 - 35 umol/L  I-stat chem 8, ED ([facility] or [facility])     Status: Abnormal   Collection Time: 03/31/22  4:49 PM  Result Value Ref Range   Sodium 134 (L) 135 - 145 mmol/L   Potassium 3.9 3.5 - 5.1 mmol/L   Chloride 95 (L) 98 - 111 mmol/L   BUN 15 6 - 20 mg/dL   Creatinine, Ser 4.091.00 0.61 - 1.24 mg/dL   Glucose, Bld 811165 (H) 70 - 99 mg/dL   Calcium, Ion 9.141.01 (L) 1.15 - 1.40 mmol/L   TCO2 30 22 - 32 mmol/L   Hemoglobin 13.3 13.0 - 17.0 g/dL   HCT 78.239.0 95.639.0 - 21.352.0 %  Blood gas, [facility] ([facility])     Status: Abnormal   Collection Time: 03/31/22  5:41 PM  Result Value Ref Range   pH, Arterial 7.54 (H) 7.35 - 7.45   pCO2 arterial 34 32 - 48 mmHg    pO2, Arterial 87 83 - 108 mmHg   Bicarbonate 29.1 (H) 20.0 - 28.0 mmol/L   Acid-Base Excess 6.6 (H) 0.0 - 2.0 mmol/L   O2 Saturation 97.6 %   Patient temperature 37.0    Allens test (pass/fail) PASS PASS   Recent Results (from the past 240 hour(s))  Resp panel by RT-PCR (RSV, Flu A&B, Covid) Anterior Nasal Swab     Status: None   Collection Time: 03/31/22  4:21 PM   Specimen: Anterior Nasal Swab  Result Value Ref Range Status   SARS Coronavirus 2 by RT PCR NEGATIVE NEGATIVE Final    Comment: (NOTE) SARS-CoV-2 target nucleic acids are NOT DETECTED.  The SARS-CoV-2 RNA is generally detectable in upper respiratory specimens during the acute phase of infection. The lowest concentration of SARS-CoV-2 viral copies this assay can detect is 138 copies/mL. A negative result does not preclude SARS-Cov-2 infection and should not be used as the sole basis for treatment or other patient management decisions. A negative result may occur with  improper specimen collection/handling, submission of specimen other than nasopharyngeal swab, presence of viral mutation(s) within the areas targeted by this assay, and inadequate number of viral copies(<138 copies/mL). A negative result must be combined with clinical observations, patient history, and epidemiological information. The expected result is Negative.  Fact Sheet for Patients:  BloggerCourse.comhttps://www.fda.gov/media/152166/download  Fact  Sheet for Healthcare Providers:  SeriousBroker.it  This test is no t yet approved or cleared by the [place] FDA and  has been authorized for detection and/or diagnosis of SARS-CoV-2 by FDA under an Emergency Use Authorization (EUA). This EUA will remain  in effect (meaning this test can be used) for the duration of the COVID-19 declaration under Section 564(b)(1) of the Act, 21 U.S.C.section 360bbb-3(b)(1), unless the authorization is terminated  or revoked sooner.       Influenza A  by PCR NEGATIVE NEGATIVE Final   Influenza B by PCR NEGATIVE NEGATIVE Final    Comment: (NOTE) The Xpert Xpress SARS-CoV-2/FLU/RSV plus assay is intended as an aid in the diagnosis of influenza from Nasopharyngeal swab specimens and should not be used as a sole basis for treatment. Nasal washings and aspirates are unacceptable for Xpert Xpress SARS-CoV-2/FLU/RSV testing.  Fact Sheet for Patients: BloggerCourse.com  Fact Sheet for Healthcare Providers: SeriousBroker.it  This test is not yet approved or cleared by the [place] FDA and has been authorized for detection and/or diagnosis of SARS-CoV-2 by FDA under an Emergency Use Authorization (EUA). This EUA will remain in effect (meaning this test can be used) for the duration of the COVID-19 declaration under Section 564(b)(1) of the Act, 21 U.S.C. section 360bbb-3(b)(1), unless the authorization is terminated or revoked.     Resp Syncytial Virus by PCR NEGATIVE NEGATIVE Final    Comment: (NOTE) Fact Sheet for Patients: BloggerCourse.com  Fact Sheet for Healthcare Providers: SeriousBroker.it  This test is not yet approved or cleared by the [place] FDA and has been authorized for detection and/or diagnosis of SARS-CoV-2 by FDA under an Emergency Use Authorization (EUA). This EUA will remain in effect (meaning this test can be used) for the duration of the COVID-19 declaration under Section 564(b)(1) of the Act, 21 U.S.C. section 360bbb-3(b)(1), unless the authorization is terminated or revoked.  Performed at [facility], [address] [place], [place] [address]     Renal Function: Recent Labs    03/31/22 1631 03/31/22 1649  CREATININE 1.13 1.00   CrCl cannot be calculated (Unknown ideal weight.).  Radiologic Imaging: CT ABDOMEN PELVIS W CONTRAST  Result Date: 03/31/2022 CLINICAL  DATA:  Abdominal pain. EXAM: CT ABDOMEN AND PELVIS WITH CONTRAST TECHNIQUE: Multidetector CT imaging of the abdomen and pelvis was performed using the standard protocol following bolus administration of intravenous contrast. RADIATION DOSE REDUCTION: This exam was performed according to the departmental dose-optimization program which includes automated exposure control, adjustment of the mA and/or kV according to patient size and/or use of iterative reconstruction technique. CONTRAST:  OMNIPAQUE IOHEXOL 300 MG/ML  SOLN COMPARISON:  CT abdomen and pelvis 06/10/2018 FINDINGS: Lower chest: No acute abnormality. Hepatobiliary: The liver is enlarged. There is diffuse fatty infiltration of the liver. The gallbladder bile ducts are within normal limits. Pancreas: Unremarkable. No pancreatic ductal dilatation or surrounding inflammatory changes. Spleen: Normal in size without focal abnormality. Adrenals/Urinary Tract: There is a 6 mm calculus in the distal left ureter. There is mild left-sided hydroureteronephrosis and mild left perinephric fat stranding. There are additional nonobstructing bilateral renal calculi; the largest is in the right kidney measuring 11 mm. There is no right-sided hydronephrosis. There is mild bladder wall thickening with surrounding inflammation. There is also mild inflammatory stranding surrounding the bilateral distal ureters. The adrenal glands are within normal limits. Stomach/Bowel: Stomach is within normal limits. No evidence of bowel wall thickening, distention, or inflammatory changes. There are postsurgical  changes in the stomach. Appendix is not visualized. Vascular/Lymphatic: No significant vascular findings are present. No enlarged abdominal or pelvic lymph nodes. Reproductive: Prostate is unremarkable. Other: No abdominal wall hernia or abnormality. No abdominopelvic ascites. Musculoskeletal: Postsurgical changes/disc spaces are seen at L3-L4 and L4-L5. thoracic spinal cord  stimulator device again seen. IMPRESSION: 1. 6 mm calculus in the distal left ureter with mild obstructive uropathy. 2. Additional nonobstructing bilateral renal calculi. 3. Mild bladder wall thickening with surrounding inflammation concerning for cystitis. There is also mild inflammatory stranding surrounding the distal ureters bilaterally which may be related to infection. 4. Hepatomegaly and hepatic steatosis. Electronically Signed   By: Darliss Cheney M.D.   On: 03/31/2022 20:59   CT Head Wo Contrast  Result Date: 03/31/2022 CLINICAL DATA:  Altered mental status.  Headache and fever. EXAM: CT HEAD WITHOUT CONTRAST TECHNIQUE: Contiguous axial images were obtained from the base of the skull through the vertex without intravenous contrast. RADIATION DOSE REDUCTION: This exam was performed according to the departmental dose-optimization program which includes automated exposure control, adjustment of the mA and/or kV according to patient size and/or use of iterative reconstruction technique. COMPARISON:  CT head dated Aug 25, 2019 FINDINGS: Brain: No evidence of acute infarction, hemorrhage, hydrocephalus, extra-axial collection or mass lesion/mass effect. Vascular: No hyperdense vessel or unexpected calcification. Skull: Normal. Negative for fracture or focal lesion. Sinuses/Orbits: No acute finding. Other: None. IMPRESSION: No acute intracranial pathology. Electronically Signed   By: Larose Hires D.O.   On: 03/31/2022 20:59   DG Chest Port 1 View  Result Date: 03/31/2022 CLINICAL DATA:  Possible sepsis. EXAM: PORTABLE CHEST 1 VIEW COMPARISON:  08/24/2019 FINDINGS: Lungs are somewhat hypoinflated demonstrate no focal airspace consolidation or effusion. Mild stable cardiomegaly. Remainder of the exam is unchanged. IMPRESSION: 1. Hypoinflation without acute cardiopulmonary disease. 2. Mild stable cardiomegaly. Electronically Signed   By: Elberta Fortis M.D.   On: 03/31/2022 16:56    I independently reviewed  the above imaging studies.  Impression/Recommendation 56 year old male with acute encephalopathy and concern for urosepsis secondary to an obstructing left ureteral stone  -The risks, benefits and alternatives of cystoscopy with LEFT JJ stent placement was discussed with the patient.  Risks include, but are not limited to: bleeding, urinary tract infection, ureteral injury, ureteral stricture disease, chronic pain, urinary symptoms, bladder injury, stent migration, the need for nephrostomy tube placement, MI, CVA, DVT, PE and the inherent risks with general anesthesia.  The patient voices understanding and wishes to proceed.   Rhoderick Moody, MD [facility] 03/31/2022, 10:53 PM

## 2022-03-31 NOTE — ED Notes (Signed)
Pt O2 91% on RA, pt placed on 2L [place] and O2 reading 95%. Arthor Captain, PA aware.

## 2022-03-31 NOTE — Anesthesia Preprocedure Evaluation (Addendum)
Anesthesia Evaluation  Patient identified by MRN, date of birth, ID band Patient awake    Reviewed: Allergy & Precautions, NPO status , Patient's Chart, lab work & pertinent test results  History of Anesthesia Complications (+) PONV  Airway Mallampati: II  TM Distance: >3 FB Neck ROM: Full    Dental no notable dental hx. (+) Teeth Intact, Dental Advisory Given   Pulmonary sleep apnea and Continuous Positive Airway Pressure Ventilation , COPD,  oxygen dependent On 4L [place]   Pulmonary exam normal breath sounds clear to auscultation       Cardiovascular pulmonary hypertension+CHF  Normal cardiovascular exam Rhythm:Regular Rate:Normal  TTE 05/2019 1. Left ventricular ejection fraction, by estimation, is 60 to 65%. The  left ventricle has normal function. The left ventricle has no regional  wall motion abnormalities. Left ventricular diastolic parameters were  normal.   2. Right ventricular systolic function is normal. The right ventricular  size is normal.   3. The mitral valve is normal in structure and function. No evidence of  mitral valve regurgitation. No evidence of mitral stenosis.   4. The aortic valve is normal in structure and function. Aortic valve  regurgitation is not visualized. No aortic stenosis is present.   5. The inferior vena cava is normal in size with greater than 50%  respiratory variability, suggesting right atrial pressure of 3 mmHg.      Neuro/Psych    GI/Hepatic hiatal hernia,GERD  ,,  Endo/Other  diabetes  Morbid obesity  Renal/GU Lab Results      Component                Value               Date                      CREATININE               1.00                03/31/2022                BUN                      15                  03/31/2022                NA                       134 (L)             03/31/2022                K                        3.9                 03/31/2022                 CL                       95 (L)              03/31/2022                CO2  26                  03/31/2022                Musculoskeletal  (+) Arthritis ,    Abdominal   Peds  Hematology Lab Results      Component                Value               Date                      WBC                      11.5 (H)            03/31/2022                HGB                      13.3                03/31/2022                HCT                      39.0                03/31/2022                MCV                      93.6                03/31/2022                PLT                      271                 03/31/2022              Anesthesia Other Findings All Tape  Reproductive/Obstetrics                             Anesthesia Physical Anesthesia Plan  ASA: 4 and emergent  Anesthesia Plan: General   Post-op Pain Management:    Induction: Intravenous  PONV Risk Score and Plan: 3 and Treatment may vary due to age or medical condition and Ondansetron  Airway Management Planned: LMA  Additional Equipment: None  Intra-op Plan:   Post-operative Plan:   Informed Consent: I have reviewed the patients History and Physical, chart, labs and discussed the procedure including the risks, benefits and alternatives for the proposed anesthesia with the patient or authorized representative who has indicated his/her understanding and acceptance.     Dental advisory given  Plan Discussed with: CRNA, Anesthesiologist and Surgeon  Anesthesia Plan Comments:        Anesthesia Quick Evaluation

## 2022-03-31 NOTE — Progress Notes (Signed)
A consult was received from an ED provider for cefepime and vancomycin per pharmacy dosing. The patient's profile has been reviewed for ht/wt/allergies/indication/available labs.    Ht/wt order entered. No weight documented. Will use TBW = 158.8 kg documented on office visit note on 03/18/22 for dosing at this time.   A one time order has been placed for cefepime 2 g IV once + vancomycin 2000 mg IV once.    Further antibiotics/pharmacy consults should be ordered by admitting physician if indicated.                       Thank you, Cindi Carbon, PharmD 03/31/2022  4:40 PM

## 2022-03-31 NOTE — ED Notes (Signed)
Arthor Captain, PA bedside and aware pts respirations 35 bpm. Pt denies SOB.

## 2022-03-31 NOTE — Progress Notes (Signed)
Pharmacy Antibiotic Note  [name] is a 56 y.o. male admitted on 03/31/2022 with acute encephalopathy and concern for urosepsis secondary to an obstructing left ureteral stone .  Pharmacy has been consulted for cefepime dosing.  Plan: Cefepime 2gm IV q8h Follow renal function, cultures and clinical course     Temp (24hrs), Avg:100 F (37.8 C), Min:98 F (36.7 C), Max:102.7 F (39.3 C)  Recent Labs  Lab 03/31/22 1625 03/31/22 1631 03/31/22 1649  WBC  --  11.5*  --   CREATININE  --  1.13 1.00  LATICACIDVEN 1.8  --   --     CrCl cannot be calculated (Unknown ideal weight.).    Allergies  Allergen Reactions   Tape Other (See Comments)    Blisters - tolerates paper tape well    Antimicrobials this admission: 12/18 cefepime >> 12/18 flagyl x 1 12/18 vanc x 1  Dose adjustments this admission:   Microbiology results: 12/18 BCx:  12/18 UCx:   Thank you for allowing pharmacy to be a part of this patient's care.  Arley Phenix RPh 03/31/2022, 11:18 PM

## 2022-03-31 NOTE — Op Note (Signed)
Operative Note  Preoperative diagnosis:  1.  Left ureteral stone 2.  Urosepsis  Postoperative diagnosis: Same  Procedure(s): 1.  Cystoscopy with left ureteral stent placement 2.  Left retrograde pyelogram with intraoperative interpretation fluoroscopic imaging  Surgeon: Rhoderick Moody, MD  Assistants:  None  Anesthesia:  General  Complications:  None  EBL: Less than 5 mL  Specimens: 1.  Urine for culture and sensitivity  Drains/Catheters: 1.  Left 6 French, 26 cm JJ stent without tether 2.  16 French Foley catheter with 10 mL of sterile water in the balloon  Intraoperative findings:   Left retrograde pyelogram revealed a tortuous and dilated distal ureter with a filling defect consistent with the obstructing stone seen on recent cross-sectional imaging.  The remainder of the proximal aspects of the ureter could not be opacified due to the stone obstruction Purulent urine seen draining from the left UO following wire manipulation and stent placement  Indication:  [name] is a 56 y.o. male with urosepsis and acute encephalopathy secondary to an obstructing left ureteral stone.  He has been brought from the emergency department to the operating room for urgent stent placement   Description of procedure:  After informed consent was obtained, the patient was brought to the operating room and general LMA anesthesia was administered. The patient was then placed in the dorsolithotomy position and prepped and draped in the usual sterile fashion. A timeout was performed. A 23 French rigid cystoscope was then inserted into the urethral meatus and advanced into the bladder under direct vision. A complete bladder survey revealed no intravesical pathology.  A 5 French ureteral catheter was then inserted into the left ureteral orifice and a retrograde pyelogram was obtained, with the findings listed above.  A Glidewire was then used to intubate the lumen of the ureteral catheter  and was advanced up to the left renal pelvis, under fluoroscopic guidance.  The catheter was then removed, leaving the wire in place.  A 6 French, 26 cm JJ stent was then advanced over the wire and into good position within the left collecting system, confirming placement via fluoroscopy.  There was purulent urine seen draining around the stent and from the left ureter following stent placement.  A urine specimen was obtained for culture.  A 16 French Foley catheter was then inserted and placed to gravity drainage.  He tolerated the procedure well and was transferred to the postanesthesia in stable condition.  Plan: Keep Foley catheter in place once he is afebrile for 24 hours.  Will arrange outpatient follow-up to plan ureteroscopy and stone extraction

## 2022-03-31 NOTE — Assessment & Plan Note (Signed)
Patient reports that he is typically on 3-4L at baseline

## 2022-03-31 NOTE — Assessment & Plan Note (Addendum)
Does not use cpap machine due to mask intolerance  Oxygen via nasal cannula with sleep

## 2022-03-31 NOTE — Anesthesia Procedure Notes (Signed)
Procedure Name: LMA Insertion Date/Time: 03/31/2022 11:34 PM  Performed by: Epimenio Sarin, CRNAPre-anesthesia Checklist: Patient identified, Emergency Drugs available, Suction available, Patient being monitored and Timeout performed Patient Re-evaluated:Patient Re-evaluated prior to induction Oxygen Delivery Method: Circle system utilized Preoxygenation: Pre-oxygenation with 100% oxygen Induction Type: IV induction LMA: LMA with gastric port inserted LMA Size: 5.0 Number of attempts: 1 Dental Injury: Teeth and Oropharynx as per pre-operative assessment

## 2022-03-31 NOTE — Assessment & Plan Note (Signed)
Recent A1C of 5.3, diet controlled Accuchecks q 4 hours while NPO and SSI

## 2022-03-31 NOTE — ED Triage Notes (Addendum)
Pt BIB EMS from home. Pt wife c/o pt getting progressively more altered since 12/16. Pt is A&Ox1. EMS reports UTI smell. EMS reports pt is warm to touch. Per EMS, pt is wheelchair bound at baseline.  HR 110 Resp 38 130/80  20G L hand 800 mL NS given IV by EMS CBG 200

## 2022-03-31 NOTE — Sepsis Progress Note (Signed)
Sepsis protocol monitored by eLink 

## 2022-04-01 ENCOUNTER — Encounter (HOSPITAL_COMMUNITY): Payer: Self-pay | Admitting: Urology

## 2022-04-01 ENCOUNTER — Inpatient Hospital Stay (HOSPITAL_COMMUNITY): Payer: Medicare HMO

## 2022-04-01 DIAGNOSIS — N390 Urinary tract infection, site not specified: Secondary | ICD-10-CM | POA: Diagnosis present

## 2022-04-01 DIAGNOSIS — G894 Chronic pain syndrome: Secondary | ICD-10-CM

## 2022-04-01 DIAGNOSIS — J9611 Chronic respiratory failure with hypoxia: Secondary | ICD-10-CM

## 2022-04-01 DIAGNOSIS — I5032 Chronic diastolic (congestive) heart failure: Secondary | ICD-10-CM | POA: Diagnosis not present

## 2022-04-01 DIAGNOSIS — G9341 Metabolic encephalopathy: Secondary | ICD-10-CM | POA: Diagnosis present

## 2022-04-01 DIAGNOSIS — B372 Candidiasis of skin and nail: Secondary | ICD-10-CM

## 2022-04-01 DIAGNOSIS — A419 Sepsis, unspecified organism: Secondary | ICD-10-CM

## 2022-04-01 DIAGNOSIS — G4733 Obstructive sleep apnea (adult) (pediatric): Secondary | ICD-10-CM

## 2022-04-01 DIAGNOSIS — R6520 Severe sepsis without septic shock: Secondary | ICD-10-CM

## 2022-04-01 DIAGNOSIS — R262 Difficulty in walking, not elsewhere classified: Secondary | ICD-10-CM

## 2022-04-01 LAB — RAPID URINE DRUG SCREEN, HOSP PERFORMED
Amphetamines: NOT DETECTED
Barbiturates: NOT DETECTED
Benzodiazepines: NOT DETECTED
Cocaine: NOT DETECTED
Opiates: POSITIVE — AB
Tetrahydrocannabinol: NOT DETECTED

## 2022-04-01 LAB — BASIC METABOLIC PANEL
Anion gap: 9 (ref 5–15)
BUN: 15 mg/dL (ref 6–20)
CO2: 25 mmol/L (ref 22–32)
Calcium: 8.1 mg/dL — ABNORMAL LOW (ref 8.9–10.3)
Chloride: 100 mmol/L (ref 98–111)
Creatinine, Ser: 0.81 mg/dL (ref 0.61–1.24)
GFR, Estimated: >60 mL/min (ref >60)
Glucose, Bld: 173 mg/dL — ABNORMAL HIGH (ref 70–99)
Potassium: 3.9 mmol/L (ref 3.5–5.1)
Sodium: 134 mmol/L — ABNORMAL LOW (ref 135–145)

## 2022-04-01 LAB — CBC
HCT: 33.4 % — ABNORMAL LOW (ref 39.0–52.0)
Hemoglobin: 10.5 g/dL — ABNORMAL LOW (ref 13.0–17.0)
MCH: 30.1 pg (ref 26.0–34.0)
MCHC: 31.4 g/dL (ref 30.0–36.0)
MCV: 95.7 fL (ref 80.0–100.0)
Platelets: 239 10*3/uL (ref 150–400)
RBC: 3.49 MIL/uL — ABNORMAL LOW (ref 4.22–5.81)
RDW: 12.9 % (ref 11.5–15.5)
WBC: 9 10*3/uL (ref 4.0–10.5)
nRBC: 0 % (ref 0.0–0.2)

## 2022-04-01 LAB — URINALYSIS, ROUTINE W REFLEX MICROSCOPIC
RBC / HPF: >50 RBC/hpf — ABNORMAL HIGH (ref 0–5)
WBC, UA: >50 WBC/hpf — ABNORMAL HIGH (ref 0–5)

## 2022-04-01 LAB — GLUCOSE, CAPILLARY
Glucose-Capillary: 118 mg/dL — ABNORMAL HIGH (ref 70–99)
Glucose-Capillary: 126 mg/dL — ABNORMAL HIGH (ref 70–99)
Glucose-Capillary: 132 mg/dL — ABNORMAL HIGH (ref 70–99)
Glucose-Capillary: 135 mg/dL — ABNORMAL HIGH (ref 70–99)
Glucose-Capillary: 140 mg/dL — ABNORMAL HIGH (ref 70–99)
Glucose-Capillary: 145 mg/dL — ABNORMAL HIGH (ref 70–99)
Glucose-Capillary: 152 mg/dL — ABNORMAL HIGH (ref 70–99)
Glucose-Capillary: 166 mg/dL — ABNORMAL HIGH (ref 70–99)

## 2022-04-01 LAB — TSH: TSH: 0.836 u[IU]/mL (ref 0.350–4.500)

## 2022-04-01 LAB — VITAMIN B12: Vitamin B-12: 200 pg/mL (ref 180–914)

## 2022-04-01 LAB — HIV ANTIBODY (ROUTINE TESTING W REFLEX): HIV Screen 4th Generation wRfx: NONREACTIVE

## 2022-04-01 MED ORDER — PANTOPRAZOLE SODIUM 40 MG PO TBEC
80.0000 mg | DELAYED_RELEASE_TABLET | Freq: Every day | ORAL | Status: DC
  Administered 2022-04-01 – 2022-04-04 (×4): 80 mg via ORAL
  Filled 2022-04-01 (×4): qty 2

## 2022-04-01 MED ORDER — DULOXETINE HCL 30 MG PO CPEP
30.0000 mg | ORAL_CAPSULE | Freq: Two times a day (BID) | ORAL | Status: DC
  Administered 2022-04-01 – 2022-04-04 (×8): 30 mg via ORAL
  Filled 2022-04-01 (×8): qty 1

## 2022-04-01 MED ORDER — ASPIRIN 81 MG PO TBEC
81.0000 mg | DELAYED_RELEASE_TABLET | Freq: Every day | ORAL | Status: DC
  Administered 2022-04-01 – 2022-04-04 (×4): 81 mg via ORAL
  Filled 2022-04-01 (×4): qty 1

## 2022-04-01 MED ORDER — LACTATED RINGERS IV SOLN: INTRAVENOUS | Status: AC

## 2022-04-01 MED ORDER — OXYCODONE HCL 5 MG/5ML PO SOLN: 5.0000 mg | Freq: Once | ORAL | Status: DC | PRN

## 2022-04-01 MED ORDER — MORPHINE SULFATE ER 30 MG PO TBCR
30.0000 mg | EXTENDED_RELEASE_TABLET | Freq: Two times a day (BID) | ORAL | Status: DC
  Administered 2022-04-01 – 2022-04-04 (×7): 30 mg via ORAL
  Filled 2022-04-01 (×7): qty 1

## 2022-04-01 MED ORDER — TIZANIDINE HCL 4 MG PO TABS
4.0000 mg | ORAL_TABLET | Freq: Three times a day (TID) | ORAL | Status: DC | PRN
  Administered 2022-04-01 – 2022-04-02 (×3): 4 mg via ORAL
  Filled 2022-04-01 (×4): qty 1

## 2022-04-01 MED ORDER — PREGABALIN 75 MG PO CAPS
150.0000 mg | ORAL_CAPSULE | Freq: Three times a day (TID) | ORAL | Status: DC
  Administered 2022-04-02 – 2022-04-04 (×8): 150 mg via ORAL
  Filled 2022-04-01 (×8): qty 2

## 2022-04-01 MED ORDER — NYSTATIN 100000 UNIT/GM EX POWD
Freq: Two times a day (BID) | CUTANEOUS | Status: DC
  Filled 2022-04-01: qty 15

## 2022-04-01 MED ORDER — OXYCODONE HCL 5 MG PO TABS: 5.0000 mg | ORAL_TABLET | Freq: Once | ORAL | Status: DC | PRN

## 2022-04-01 MED ORDER — HYDROMORPHONE HCL 1 MG/ML IJ SOLN: 0.2500 mg | INTRAMUSCULAR | Status: DC | PRN

## 2022-04-01 MED ORDER — ORAL CARE MOUTH RINSE: 15.0000 mL | OROMUCOSAL | Status: DC | PRN

## 2022-04-01 MED ORDER — HYDROMORPHONE HCL 2 MG PO TABS
4.0000 mg | ORAL_TABLET | Freq: Four times a day (QID) | ORAL | Status: DC | PRN
  Administered 2022-04-01 – 2022-04-04 (×8): 4 mg via ORAL
  Filled 2022-04-01 (×8): qty 2

## 2022-04-01 MED ORDER — AMISULPRIDE (ANTIEMETIC) 5 MG/2ML IV SOLN: 10.0000 mg | Freq: Once | INTRAVENOUS | Status: DC | PRN

## 2022-04-01 MED ORDER — ACETAMINOPHEN 10 MG/ML IV SOLN: 1000.0000 mg | Freq: Once | INTRAVENOUS | Status: DC | PRN

## 2022-04-01 MED ORDER — ONDANSETRON HCL 4 MG/2ML IJ SOLN: 4.0000 mg | Freq: Once | INTRAMUSCULAR | Status: DC | PRN

## 2022-04-01 MED ORDER — INFLUENZA VAC SPLIT QUAD 0.5 ML IM SUSY
0.5000 mL | PREFILLED_SYRINGE | INTRAMUSCULAR | Status: AC
  Administered 2022-04-02: 0.5 mL via INTRAMUSCULAR
  Filled 2022-04-01: qty 0.5

## 2022-04-01 MED ORDER — TORSEMIDE 20 MG PO TABS: 40.0000 mg | ORAL_TABLET | Freq: Every day | ORAL | Status: DC | PRN

## 2022-04-01 MED ORDER — PNEUMOCOCCAL 20-VAL CONJ VACC 0.5 ML IM SUSY
0.5000 mL | PREFILLED_SYRINGE | INTRAMUSCULAR | Status: AC
  Administered 2022-04-02: 0.5 mL via INTRAMUSCULAR
  Filled 2022-04-01: qty 0.5

## 2022-04-01 NOTE — Transfer of Care (Signed)
Immediate Anesthesia Transfer of Care Note  Patient: [name]  Procedure(s) Performed: CYSTOSCOPY WITH RETROGRADE PYELOGRAM/URETERAL STENT PLACEMENT (Left)  Patient Location: PACU  Anesthesia Type:General  Level of Consciousness: drowsy and patient cooperative  Airway & Oxygen Therapy: Patient Spontanous Breathing and Patient connected to face mask oxygen  Post-op Assessment: Report given to RN and Post -op Vital signs reviewed and stable  Post vital signs: Reviewed and stable  Last Vitals:  Vitals Value Taken Time  BP    Temp    Pulse    Resp    SpO2      Last Pain:  Vitals:   03/31/22 2107  TempSrc: Oral  PainSc:          Complications: No notable events documented.

## 2022-04-01 NOTE — Assessment & Plan Note (Signed)
Patient reports that he has had a longstanding history of chronic pain and progressive debility secondary to a workplace related injury many years ago. Now the past several years patient had become so debilitated that he eventually became bedbound and has not gotten out of bed in the past 18 months. Now the patient is bedbound between this and his morbid obesity this is becoming immense burden for the wife to take care of him at home leading to his further physical deterioration Placing TOC consultation for assistance and determining what resources are available for this family with home health services.  If they are inadequate patient will likely need SNF placement.

## 2022-04-01 NOTE — Assessment & Plan Note (Addendum)
Initial presentation with lethargy and confusion consistent with metabolic encephalopathy likely secondary to underlying infectious process Dramatic improvement in mentation today compared to yesterday with treatment of underlying infection and intravenous volume resuscitation. Continue to monitor for symptomatic improvement to complete resolution.

## 2022-04-01 NOTE — Progress Notes (Signed)
RN received report on patient from off-going RN. RN will resume patient's care for the rest of the shift and will continue to monitor the patient for any changes. RN agrees with the patient's most recent assessment.

## 2022-04-01 NOTE — Assessment & Plan Note (Addendum)
Extensive involvement of the folds of the groin and abdomen  Placing patient on nystatin powder twice daily  Big reason for severity of presentation is the fact that the wife is unable to care for for this bedbound patient's many needs alone with no nursing assistance.   Will discuss home health options with the patient and wife prior to discharge.

## 2022-04-01 NOTE — TOC Initial Note (Signed)
Transition of Care [facility]) - Initial/Assessment Note    Patient Details  Name: [name] MRN: [id number] Date of Birth: Dec 08, 1965  Transition of Care [facility]) CM/SW Contact:    Golda Acre, RN Phone Number: 04/01/2022, 8:17 AM  Clinical Narrative:                 Transition of Care [facility]) Screening Note   Patient Details  Name: [name] Date of Birth: 11-Jan-1966   Transition of Care [facility]) CM/SW Contact:    Golda Acre, RN Phone Number: 04/01/2022, 8:17 AM    Transition of Care Department [facility]) has reviewed patient and no TOC needs have been identified at this time. We will continue to monitor patient advancement through interdisciplinary progression rounds. If new patient transition needs arise, please place a TOC consult.    Expected Discharge Plan: Home/Self Care     Patient Goals and CMS Choice     Choice offered to / list presented to : [employer] POA / Guardian  Expected Discharge Plan and Services Expected Discharge Plan: Home/Self Care   Discharge Planning Services: CM Consult   Living arrangements for the past 2 months: Single Family Home                                      Prior Living Arrangements/Services Living arrangements for the past 2 months: Single Family Home Lives with:: Spouse Patient language and need for interpreter reviewed:: Yes Do you feel safe going back to the place where you live?: Yes      Need for Family Participation in Patient Care: Yes (Comment) (legal guardian) Care giver support system in place?: Yes (comment) (legal guardian)   Criminal Activity/Legal Involvement Pertinent to Current Situation/Hospitalization: No - Comment as needed  Activities of Daily Living Home Assistive Devices/Equipment: Other (Comment), Walker (specify type), Cane (specify quad or straight), Shower chair without back, Hospital bed, Hand-held shower hose, Grab bars in shower, Oxygen (Electric wheel chair) ADL Screening  (condition at time of admission) Patient's cognitive ability adequate to safely complete daily activities?: No Is the patient deaf or have difficulty hearing?: No Does the patient have difficulty seeing, even when wearing glasses/contacts?: No Does the patient have difficulty concentrating, remembering, or making decisions?: No Patient able to express need for assistance with ADLs?: Yes Does the patient have difficulty dressing or bathing?: Yes Independently performs ADLs?: No Communication: Needs assistance Is this a change from baseline?: Pre-admission baseline Dressing (OT): Needs assistance Is this a change from baseline?: Pre-admission baseline Grooming: Needs assistance Is this a change from baseline?: Pre-admission baseline Feeding: Independent Bathing: Needs assistance Is this a change from baseline?: Pre-admission baseline Toileting: Needs assistance Is this a change from baseline?: Pre-admission baseline In/Out Bed: Needs assistance Is this a change from baseline?: Pre-admission baseline Walks in Home: Needs assistance Is this a change from baseline?: Pre-admission baseline Does the patient have difficulty walking or climbing stairs?: Yes Weakness of Legs: Both Weakness of Arms/Hands: None  Permission Sought/Granted                  Emotional Assessment Appearance:: Appears stated age Attitude/Demeanor/Rapport: Engaged Affect (typically observed): Calm Orientation: : Oriented to Self, Oriented to Place, Oriented to  Time, Oriented to Situation Alcohol / Substance Use: Not Applicable Psych Involvement: No (comment)  Admission diagnosis:  Acute encephalopathy [G93.40] Patient Active Problem List   Diagnosis Date  Noted   Acute encephalopathy 03/31/2022   Sepsis secondary urosepsis/stone 03/31/2022   Candidal intertrigo 03/31/2022   Opioid use disorder 01/06/2022   Genetic testing (CPY450 2D6: Normal activity) 11/21/2021   Abnormal MRI, lumbar spine  (11/21/2021) 11/21/2021   Displacement of spinal infusion catheter 09/19/2021   Malfunction of intrathecal infusion pump ([place]) 09/19/2021   End of battery life of intrathecal infusion pump 09/19/2021   Irritable bowel syndrome 07/16/2021   Mild persistent asthma 07/16/2021   Moderate major depression, single episode ([place]) 07/16/2021   Pure hypercholesterolemia 07/16/2021   Urinary incontinence 07/16/2021   Chronic respiratory failure ([place]) 07/16/2021   Constipation 07/16/2021   Sleep apnea 07/16/2021   Type 2 diabetes mellitus with other specified complication ([place]) [id number]   Epidural fibrosis 11/22/2020   Weakness of lower extremities (Bilateral) 11/22/2020   Weakness of back 11/22/2020   Lumbosacral spondylosis with radiculopathy 11/22/2020   Physical deconditioning 11/22/2020   Type 2 diabetes mellitus with complications ([place]) 0000000   Type 2 diabetes mellitus with morbid obesity ([place]) 11/22/2020   Physical tolerance to opiate drug 11/22/2020   Unable to ambulate 11/22/2020   Chronic use of opiate for therapeutic purpose [id number]   Uncomplicated opioid dependence ([place]) 03/15/2020   Restrictive airway disease 01/24/2020   Elevated blood-pressure reading, without diagnosis of hypertension 04/19/2019   Preoperative testing 09/13/2018   Altered mental status 07/11/2018   Fever    Myoclonic jerking    Obesity, morbid (more than 100 lbs over ideal weight or BMI > 40) ([place]) 06/30/2018   DDD (degenerative disc disease), thoracic 06/30/2018   Osteoarthritis of lumbar spine 06/30/2018   History of acute alcoholic pancreatitis [id number]   Male hypogonadism 06/30/2018   Vitamin D deficiency 06/30/2018   Chronic hyperglycemia 06/30/2018   Alcoholism ([place]) 06/18/2018   Alcohol-induced acute pancreatitis without infection or necrosis [id number]   Acute alcoholic pancreatitis [id number]   Hypokalemia 06/11/2018   Hyponatremia 06/11/2018   Chronic diastolic heart failure ([place])  06/11/2018   Nausea and vomiting 06/10/2018   Abdominal pain 06/10/2018   Encounter for screening colonoscopy 06/10/2018   Morbid obesity with BMI of 45.0-49.9, adult ([place]) 05/25/2018   Secondary osteoarthritis of multiple sites 05/25/2018   Osteoarthritis of hip (Right) 05/25/2018   Encounter for adjustment or management of infusion pump 04/01/2018   Lumbar spondylosis 01/26/2018   Spondylosis without myelopathy or radiculopathy, lumbosacral region 12/02/2017   Lumbar facet syndrome (Bilateral) (R>L) 12/02/2017   Chronic low back pain (1ry area of Pain) (Bilateral) (R>L) w/o sciatica 12/02/2017   Chronic knee pain after total replacement (Left) 12/01/2017   Chronic musculoskeletal pain 11/17/2017   Muscle spasticity 11/17/2017   Gastroesophageal reflux disease 11/17/2017   Neurogenic pain 11/17/2017   Drug-induced nausea and vomiting 11/17/2017   Therapeutic opioid-induced constipation (OIC) 11/17/2017   Chronic pain syndrome 11/09/2017   Long term prescription benzodiazepine use 11/09/2017   Long term prescription opiate use 11/09/2017   Opiate use (140 MME/day) (oral) 11/09/2017   Long-term use of high-risk medication 11/09/2017   Pharmacologic therapy 11/09/2017   Disorder of skeletal system 11/09/2017   Problems influencing health status 11/09/2017   Chronic nausea s/p bariatric surgery 11/09/2017   Presence of intrathecal pump ([employer]) 11/09/2017   Encounter for interrogation of infusion pump 11/09/2017   Chronic foot pain (Right) 11/09/2017   Chronic hip pain (Right) 11/09/2017   Old tear of medial meniscus of knee (Left) 11/09/2017   Failed back surgical syndrome 11/09/2017   Chronic low back pain (  Bilateral) w/ sciatica (Bilateral) 11/09/2017   Chronic lower extremity pain (2ry area of Pain) (Bilateral) (L>R) 11/09/2017   Chronic lower extremity radicular pain 11/09/2017   Chronic shoulder pain (Bilateral) 11/09/2017   History of total knee replacement (Left)  11/09/2017   History of arthroscopic surgery of shoulder (Bilateral) 11/09/2017   History of bariatric surgery (September 2016) [id number]   OSA complicated by mild polycythemia  10/21/2014   Polycythemia, secondary 10/21/2014   Dyspnea 10/20/2014   S/P rotator cuff repair (Bilateral) 08/18/2013   DDD (degenerative disc disease), lumbosacral 10/20/2006   PCP:  Antony Contras, MD Pharmacy:   [place], [place] - [address] [place] [address] Phone: [phone] Fax: [phone]     Social Determinants of Health (SDOH) Interventions    Readmission Risk Interventions   No data to display

## 2022-04-01 NOTE — Assessment & Plan Note (Signed)
No longer working. Getting work up before pain management replaces

## 2022-04-01 NOTE — Assessment & Plan Note (Signed)
Patient presenting with multiple SIRS criteria and evidence of organ dysfunction with acute metabolic encephalopathy all in the setting of complicated urinary tract infection Patient is currently being treated with intravenous cefepime and intravenous volume resuscitation For source control Dr. Sande Brothers with urology who performed cystoscopy and left ureteral stent placement late in the evening on 12/18.  His assist is greatly appreciated. Following blood and urine cultures

## 2022-04-01 NOTE — Assessment & Plan Note (Addendum)
56 year old male presenting with 3 day history of worsening confusion likely secondary to urosepsis. Mental status improved on my exam and alert and oriented and carrying on normal conversation  -admit to progressive -sepsis criteria met with fever, tachycardia and tachypnea in setting of likely UTI with obstructed stone. UA still not collected. CT showing 43m calculus in distal ureter with mild obstructive uropathy.  -urology consulted with plans for stent tonight -cultures pending -continue cefepime for now -metabolic labs pending -CT head negative  -continue gentle, time limited IVF

## 2022-04-01 NOTE — Assessment & Plan Note (Addendum)
No clinical evidence of cardiogenic volume overload Strict input and output monitoring Daily weights Low-sodium diet  

## 2022-04-01 NOTE — Assessment & Plan Note (Addendum)
·   Please see assessment and plan above °

## 2022-04-01 NOTE — Progress Notes (Signed)
PROGRESS NOTE   [name]  JEH:[id number] DOB: 1966-04-04 DOA: 03/31/2022 PCP: [name], MD   Date of Service: the patient was seen and examined on 04/01/2022  Brief Narrative:  56 year old male with past medical history of congestive heart failure, COPD, gastroesophageal reflux disease, obstructive sleep apnea (not on cpap), pulmonary hypertension, chronic pain with intrathecal pump (not active) who presented to [facility] emergency department via EMS with confusion over the last several days.  Upon evaluation in the emergency department patient was found to have multiple SIRS criteria with a temperature of 102.7 F and tachycardia with leukocytosis with clinical concern for urinary tract infection.  CT imaging of the abdomen and pelvis was performed revealing a 6 mm calculus in the distal left ureter with obstructive uropathy and bladder wall thickening with surrounding inflammation concerning for concurrent cystitis.  EDP discussed case with Dr. Sande Brothers with urology who performed a cystoscopy with left ureteral stent placement late in the evening on 12/18.  The hospitalist group was called to assess the patient for admission to the hospital.  Patient has been placed on intravenous cefepime and intravenous fluids.   Assessment and Plan: * Complicated UTI (urinary tract infection) Patient presenting with multiple SIRS criteria and evidence of organ dysfunction with acute metabolic encephalopathy all in the setting of complicated urinary tract infection Patient is currently being treated with intravenous cefepime and intravenous volume resuscitation For source control Dr. Sande Brothers with urology who performed cystoscopy and left ureteral stent placement late in the evening on 12/18.  His assist is greatly appreciated. Following blood and urine cultures  Sepsis secondary urosepsis/stone Please see assessment and plan above.    Acute metabolic encephalopathy Initial  presentation with lethargy and confusion consistent with metabolic encephalopathy likely secondary to underlying infectious process Dramatic improvement in mentation today compared to yesterday with treatment of underlying infection and intravenous volume resuscitation. Continue to monitor for symptomatic improvement to complete resolution.     Candidal intertrigo Extensive involvement of the folds of the groin and abdomen  Placing patient on nystatin powder twice daily  Big reason for severity of presentation is the fact that the wife is unable to care for for this bedbound patient's many needs alone with no nursing assistance.   Will discuss home health options with the patient and wife prior to discharge.  Chronic pain syndrome With improving mentation patient's home regimen of chronic analgesics have been resumed  lyrica 150mg  TID morphine (MS contin) 30mg  BID dilaudid 4mg  q 6 hours PRN severe pain tizanidine    Type 2 diabetes mellitus with diabetic polyneuropathy, without long-term current use of insulin (HCC) Patient been placed on Accu-Cheks before every meal and nightly with sliding scale insulin Hemoglobin A1C ordered Diabetic Diet   Chronic diastolic heart failure (HCC) No clinical evidence of cardiogenic volume overload Strict input and output monitoring Daily weights Low-sodium diet   Chronic respiratory failure (HCC) Patient reports that he is typically on 3-4L at baseline  Unable to ambulate Patient reports that he has had a longstanding history of chronic pain and progressive debility secondary to a workplace related injury many years ago. Now the past several years patient had become so debilitated that he eventually became bedbound and has not gotten out of bed in the past 18 months. Now the patient is bedbound between this and his morbid obesity this is becoming immense burden for the wife to take care of him at home leading to his further physical  deterioration Placing  TOC consultation for assistance and determining what resources are available for this family with home health services.  If they are inadequate patient will likely need SNF placement.  OSA complicated by mild polycythemia  Does not use cpap machine due to mask intolerance  Oxygen via nasal cannula with sleep  Presence of intrathecal pump ([employer]) No longer working. Getting work up before pain management replaces         Subjective:  Patient's wife reports that patient mentation has dramatically improved since yesterday.  Patient is now tolerating oral intake although he complains of significant associated generalized weakness.  Physical Exam:  Vitals:   04/01/22 1305 04/01/22 1652 04/01/22 1900 04/01/22 2046  BP: 103/64 (!) 116/58 109/74 109/74  Pulse: 62 70 75 95  Resp:  17 18 20   Temp:  98 F (36.7 C) 98.1 F (36.7 C) 98 F (36.7 C)  TempSrc:  Oral Oral Oral  SpO2:  99% 96% 99%  Weight:      Height:        Constitutional: Lethargic arousable and oriented x 3.  No associated distress.  Patient is obese.  Skin: Extensive hyperemic areas with areas of skin breakdown of the folds of the groin and abdomen consistent with candidal intertrigo.  Poor skin turgor noted. Eyes: Pupils are equally reactive to light.  No evidence of scleral icterus or conjunctival pallor.  ENMT: Moist mucous membranes noted.  Posterior pharynx clear of any exudate or lesions.   Respiratory: clear to auscultation bilaterally, no wheezing, no crackles. Normal respiratory effort. No accessory muscle use.  Cardiovascular: Regular rate and rhythm, no murmurs / rubs / gallops. No extremity edema. 2+ pedal pulses. No carotid bruits.  Abdomen: Abdomen is protuberant with some left-sided abdominal tenderness.  No evidence of intra-abdominal masses.  Positive bowel sounds noted in all quadrants.   Musculoskeletal: No joint deformity upper and lower extremities. Good ROM, no  contractures. Normal muscle tone.    Data Reviewed:  I have personally reviewed and interpreted labs, imaging.  Significant findings are   CBC: Recent Labs  Lab 03/31/22 1631 03/31/22 1649 04/01/22 0435  WBC 11.5*  --  9.0  NEUTROABS 9.4*  --   --   HGB 12.7* 13.3 10.5*  HCT 39.5 39.0 33.4*  MCV 93.6  --  95.7  PLT 271  --  239   Basic Metabolic Panel: Recent Labs  Lab 03/31/22 1631 03/31/22 1649 04/01/22 0435  NA 135 134* 134*  K 3.7 3.9 3.9  CL 98 95* 100  CO2 26  --  25  GLUCOSE 162* 165* 173*  BUN 15 15 15   CREATININE 1.13 1.00 0.81  CALCIUM 8.8*  --  8.1*   GFR: Estimated Creatinine Clearance: 159.7 mL/min (by C-G formula based on SCr of 0.81 mg/dL). Liver Function Tests: Recent Labs  Lab 03/31/22 1631  AST 40  ALT 26  ALKPHOS 70  BILITOT 0.7  PROT 8.1  ALBUMIN 2.8*    Coagulation Profile: Recent Labs  Lab 03/31/22 1631  INR 1.1     EKG/Telemetry: Personally reviewed.  Rhythm is normal sinus rhythm with heart rate of 75 bpm.  No dynamic ST segment changes appreciated.   Code Status:  Full code.  Code status decision has been confirmed with: Patient and wife at the bedside Family Communication: Plan of care discussed with the wife who is currently at the bedside.   Severity of Illness:  The appropriate patient status for this patient is INPATIENT. Inpatient status  is judged to be reasonable and necessary in order to provide the required intensity of service to ensure the patient's safety. The patient's presenting symptoms, physical exam findings, and initial radiographic and laboratory data in the context of their chronic comorbidities is felt to place them at high risk for further clinical deterioration. Furthermore, it is not anticipated that the patient will be medically stable for discharge from the hospital within 2 midnights of admission.   * I certify that at the point of admission it is my clinical judgment that the patient will  require inpatient hospital care spanning beyond 2 midnights from the point of admission due to high intensity of service, high risk for further deterioration and high frequency of surveillance required.*  Time spent:  58 minutes  Author:  Marinda Elk MD  04/01/2022 11:17 PM

## 2022-04-01 NOTE — Hospital Course (Addendum)
56 year old male with past medical history of congestive heart failure, COPD, gastroesophageal reflux disease, obstructive sleep apnea (not on cpap), pulmonary hypertension, chronic pain with intrathecal pump (not active) who presented to [facility] emergency department via EMS with confusion over the last several days.  Upon evaluation in the emergency department patient was found to have multiple SIRS criteria with a temperature of 102.7 F and tachycardia with leukocytosis with clinical concern for urinary tract infection.  CT imaging of the abdomen and pelvis was performed revealing a 6 mm calculus in the distal left ureter with obstructive uropathy and bladder wall thickening with surrounding inflammation concerning for concurrent cystitis.  EDP discussed case with Dr. Sande Brothers with urology who performed a cystoscopy with left ureteral stent placement late in the evening on 12/18.  The hospitalist group was called to assess the patient for admission to the hospital.  Patient has been placed on intravenous cefepime and intravenous fluids.

## 2022-04-01 NOTE — Progress Notes (Signed)
1 Day Post-Op Subjective: Patient with no complaints.  Wife at bedside  Objective: Vital signs in last 24 hours: Temp:  [98 F (36.7 C)-102.7 F (39.3 C)] 98.2 F (36.8 C) (12/19 0900) Pulse Rate:  [70-109] 77 (12/19 0900) Resp:  [19-35] 20 (12/19 0900) BP: (92-152)/(64-88) 109/70 (12/19 0900) SpO2:  [94 %-100 %] 98 % (12/19 0900) Weight:  [161 kg] 161 kg (12/19 0225)  Intake/Output from previous day: 12/18 0701 - 12/19 0700 In: 865.8 [P.O.:120; I.V.:545.8; IV Piggyback:200] Out: 1150 [Urine:1150]  Intake/Output this shift: No intake/output data recorded.  Physical Exam:  General: Alert and oriented Gu: Foley catheter in place and draining amber urine  Lab Results: Recent Labs    03/31/22 1631 03/31/22 1649 04/01/22 0435  HGB 12.7* 13.3 10.5*  HCT 39.5 39.0 33.4*   BMET Recent Labs    03/31/22 1631 03/31/22 1649 04/01/22 0435  NA 135 134* 134*  K 3.7 3.9 3.9  CL 98 95* 100  CO2 26  --  25  GLUCOSE 162* 165* 173*  BUN 15 15 15   CREATININE 1.13 1.00 0.81  CALCIUM 8.8*  --  8.1*     Studies/Results: DG C-Arm 1-60 Min-No Report  Result Date: 04/01/2022 Fluoroscopy was utilized by the requesting physician.  No radiographic interpretation.   CT ABDOMEN PELVIS W CONTRAST  Result Date: 03/31/2022 CLINICAL DATA:  Abdominal pain. EXAM: CT ABDOMEN AND PELVIS WITH CONTRAST TECHNIQUE: Multidetector CT imaging of the abdomen and pelvis was performed using the standard protocol following bolus administration of intravenous contrast. RADIATION DOSE REDUCTION: This exam was performed according to the departmental dose-optimization program which includes automated exposure control, adjustment of the mA and/or kV according to patient size and/or use of iterative reconstruction technique. CONTRAST:  159mL OMNIPAQUE IOHEXOL 300 MG/ML  SOLN COMPARISON:  CT abdomen and pelvis 06/10/2018 FINDINGS: Lower chest: No acute abnormality. Hepatobiliary: The liver is enlarged. There is  diffuse fatty infiltration of the liver. The gallbladder bile ducts are within normal limits. Pancreas: Unremarkable. No pancreatic ductal dilatation or surrounding inflammatory changes. Spleen: Normal in size without focal abnormality. Adrenals/Urinary Tract: There is a 6 mm calculus in the distal left ureter. There is mild left-sided hydroureteronephrosis and mild left perinephric fat stranding. There are additional nonobstructing bilateral renal calculi; the largest is in the right kidney measuring 11 mm. There is no right-sided hydronephrosis. There is mild bladder wall thickening with surrounding inflammation. There is also mild inflammatory stranding surrounding the bilateral distal ureters. The adrenal glands are within normal limits. Stomach/Bowel: Stomach is within normal limits. No evidence of bowel wall thickening, distention, or inflammatory changes. There are postsurgical changes in the stomach. Appendix is not visualized. Vascular/Lymphatic: No significant vascular findings are present. No enlarged abdominal or pelvic lymph nodes. Reproductive: Prostate is unremarkable. Other: No abdominal wall hernia or abnormality. No abdominopelvic ascites. Musculoskeletal: Postsurgical changes/disc spaces are seen at L3-L4 and L4-L5. thoracic spinal cord stimulator device again seen. IMPRESSION: 1. 6 mm calculus in the distal left ureter with mild obstructive uropathy. 2. Additional nonobstructing bilateral renal calculi. 3. Mild bladder wall thickening with surrounding inflammation concerning for cystitis. There is also mild inflammatory stranding surrounding the distal ureters bilaterally which may be related to infection. 4. Hepatomegaly and hepatic steatosis. Electronically Signed   By: Ronney Asters M.D.   On: 03/31/2022 20:59   CT Head Wo Contrast  Result Date: 03/31/2022 CLINICAL DATA:  Altered mental status.  Headache and fever. EXAM: CT HEAD WITHOUT CONTRAST TECHNIQUE: Contiguous axial  images were  obtained from the base of the skull through the vertex without intravenous contrast. RADIATION DOSE REDUCTION: This exam was performed according to the departmental dose-optimization program which includes automated exposure control, adjustment of the mA and/or kV according to patient size and/or use of iterative reconstruction technique. COMPARISON:  CT head dated Aug 25, 2019 FINDINGS: Brain: No evidence of acute infarction, hemorrhage, hydrocephalus, extra-axial collection or mass lesion/mass effect. Vascular: No hyperdense vessel or unexpected calcification. Skull: Normal. Negative for fracture or focal lesion. Sinuses/Orbits: No acute finding. Other: None. IMPRESSION: No acute intracranial pathology. Electronically Signed   By: Larose Hires D.O.   On: 03/31/2022 20:59   DG Chest Port 1 View  Result Date: 03/31/2022 CLINICAL DATA:  Possible sepsis. EXAM: PORTABLE CHEST 1 VIEW COMPARISON:  08/24/2019 FINDINGS: Lungs are somewhat hypoinflated demonstrate no focal airspace consolidation or effusion. Mild stable cardiomegaly. Remainder of the exam is unchanged. IMPRESSION: 1. Hypoinflation without acute cardiopulmonary disease. 2. Mild stable cardiomegaly. Electronically Signed   By: Elberta Fortis M.D.   On: 03/31/2022 16:56    Assessment/Plan: 56 year old male with urosepsis secondary to an obstructing 6 mm left ureteral stone, status post left ureteral stent placement on 03/31/2022  -Okay to DC Foley catheter from GU standpoint -Urine culture pending.  Continue empiric antibiotics.  Recommend 10 to 14 days of antibiotic coverage.  I will arrange outpatient ureteroscopy in the next 2 to 3 weeks to address his stone burden. -Call back if needed   LOS: 1 day   Rhoderick Moody, MD [facility] Pager: [phone]  04/01/2022, 10:49 AM

## 2022-04-01 NOTE — Progress Notes (Signed)
Foley was removed at [employer] by Consulting civil engineer. Charge RN told RN that there was some resistance when taking Foley out and there was some blood lining on the outer catheter. Will pass along to night shift nurse to monitor for output following Foley removal.

## 2022-04-01 NOTE — Progress Notes (Signed)
PT Cancellation Note  Patient Details Name: [name] MRN: [id number] DOB: December 28, 1965   Cancelled Treatment:    Reason Eval/Treat Not Completed: Fatigue/lethargy limiting ability to participate (pt stated he's too weak and fatigued to attempt mobility. He reports he's not walked in a year, he independently transfers to a motorized WC at baseline. He refused attempting to sit at edge of bed. Benefits of mobility/risks of bedrest were explained.) Will follow.   Ralene Bathe Kistler PT 04/01/2022  Acute Rehabilitation Services  Office [phone]

## 2022-04-01 NOTE — Anesthesia Postprocedure Evaluation (Signed)
Anesthesia Post Note  Patient: [name]  Procedure(s) Performed: CYSTOSCOPY WITH RETROGRADE PYELOGRAM/URETERAL STENT PLACEMENT (Left: Ureter)     Patient location during evaluation: PACU Anesthesia Type: General Level of consciousness: awake and alert Pain management: pain level controlled Vital Signs Assessment: post-procedure vital signs reviewed and stable Respiratory status: spontaneous breathing, nonlabored ventilation, respiratory function stable and patient connected to nasal cannula oxygen Cardiovascular status: blood pressure returned to baseline and stable Postop Assessment: no apparent nausea or vomiting Anesthetic complications: no  No notable events documented.  Last Vitals:  Vitals:   04/01/22 0440 04/01/22 0900  BP: 92/70 109/70  Pulse: 70 77  Resp: 19 20  Temp: 36.8 C 36.8 C  SpO2: 98% 98%    Last Pain:  Vitals:   04/01/22 0900  TempSrc: Oral  PainSc:                  Trevor Iha

## 2022-04-02 DIAGNOSIS — N390 Urinary tract infection, site not specified: Secondary | ICD-10-CM | POA: Diagnosis not present

## 2022-04-02 LAB — COMPREHENSIVE METABOLIC PANEL
ALT: 31 U/L (ref 0–44)
AST: 38 U/L (ref 15–41)
Albumin: 2.3 g/dL — ABNORMAL LOW (ref 3.5–5.0)
Alkaline Phosphatase: 54 U/L (ref 38–126)
Anion gap: 10 (ref 5–15)
BUN: 13 mg/dL (ref 6–20)
CO2: 26 mmol/L (ref 22–32)
Calcium: 8.3 mg/dL — ABNORMAL LOW (ref 8.9–10.3)
Chloride: 99 mmol/L (ref 98–111)
Creatinine, Ser: 0.85 mg/dL (ref 0.61–1.24)
GFR, Estimated: >60 mL/min (ref >60)
Glucose, Bld: 133 mg/dL — ABNORMAL HIGH (ref 70–99)
Potassium: 3.3 mmol/L — ABNORMAL LOW (ref 3.5–5.1)
Sodium: 135 mmol/L (ref 135–145)
Total Bilirubin: 0.6 mg/dL (ref 0.3–1.2)
Total Protein: 6.7 g/dL (ref 6.5–8.1)

## 2022-04-02 LAB — CBC WITH DIFFERENTIAL/PLATELET
Abs Immature Granulocytes: 0.04 10*3/uL (ref 0.00–0.07)
Basophils Absolute: 0 10*3/uL (ref 0.0–0.1)
Basophils Relative: 0 %
Eosinophils Absolute: 0.1 10*3/uL (ref 0.0–0.5)
Eosinophils Relative: 2 %
HCT: 35.7 % — ABNORMAL LOW (ref 39.0–52.0)
Hemoglobin: 11 g/dL — ABNORMAL LOW (ref 13.0–17.0)
Immature Granulocytes: 1 %
Lymphocytes Relative: 21 %
Lymphs Abs: 1.6 10*3/uL (ref 0.7–4.0)
MCH: 29.9 pg (ref 26.0–34.0)
MCHC: 30.8 g/dL (ref 30.0–36.0)
MCV: 97 fL (ref 80.0–100.0)
Monocytes Absolute: 0.6 10*3/uL (ref 0.1–1.0)
Monocytes Relative: 9 %
Neutro Abs: 5.1 10*3/uL (ref 1.7–7.7)
Neutrophils Relative %: 67 %
Platelets: 265 10*3/uL (ref 150–400)
RBC: 3.68 MIL/uL — ABNORMAL LOW (ref 4.22–5.81)
RDW: 12.8 % (ref 11.5–15.5)
WBC: 7.5 10*3/uL (ref 4.0–10.5)
nRBC: 0 % (ref 0.0–0.2)

## 2022-04-02 LAB — GLUCOSE, CAPILLARY
Glucose-Capillary: 117 mg/dL — ABNORMAL HIGH (ref 70–99)
Glucose-Capillary: 142 mg/dL — ABNORMAL HIGH (ref 70–99)
Glucose-Capillary: 127 mg/dL — ABNORMAL HIGH (ref 70–99)
Glucose-Capillary: 110 mg/dL — ABNORMAL HIGH (ref 70–99)

## 2022-04-02 LAB — URINE CULTURE: Culture: <10000 — AB

## 2022-04-02 LAB — MAGNESIUM: Magnesium: 2.1 mg/dL (ref 1.7–2.4)

## 2022-04-02 NOTE — Progress Notes (Signed)
Inpatient Rehab Admissions Coordinator:   Per therapy recommendations, patient was screened for CIR candidacy by Megan Salon, MS, CCC-SLP. At this time, Pt. does not appear to demonstrate medical necessity to justify in hospital rehabilitation/CIR. I  will not pursue a rehab consult for this Pt.   Recommend other rehab venues to be pursued.  Please contact me with any questions.  Megan Salon, MS, CCC-SLP Rehab Admissions Coordinator  [phone] (celll) [phone] (office)

## 2022-04-02 NOTE — Evaluation (Addendum)
Occupational Therapy Evaluation Patient Details Name: [name] MRN: [id number] DOB: 1966/01/14 Today's Date: 04/02/2022   History of Present Illness 56 year old male with urosepsis secondary to an obstructing 6 mm left ureteral stone, status post left ureteral stent placement on 03/31/2022. PMH: CHF, asthma, obesity, OSA, chronic pain.   Clinical Impression   Pt admitted with the above diagnoses and presents with below problem list. Pt will benefit from continued acute OT to address the below listed deficits and maximize independence with basic ADLs prior to d/c to venue below. At baseline, pt is able to complete squat-pivot transfers at electric w/c level with no physical assist. Pt endorses struggling with basic ADLs. Pt able to stand and sidestep this session with +2 assist. Pt currently needs up to mod A assist with UB ADLs, max +2 assist with LB ADLs and pericare. Spouse present throughout session. Pt eager to work with OT/PT. Recommend bariatric recliner, drop arm bariatric BSC, and EZ Wider bariatric bed while in the hospital.        Recommendations for follow up therapy are one component of a multi-disciplinary discharge planning process, led by the attending physician.  Recommendations may be updated based on patient status, additional functional criteria and insurance authorization.   Follow Up Recommendations  Acute inpatient rehab (3hours/day)     Assistance Recommended at Discharge Intermittent Supervision/Assistance  Patient can return home with the following      Functional Status Assessment  Patient has had a recent decline in their functional status and demonstrates the ability to make significant improvements in function in a reasonable and predictable amount of time.  Equipment Recommendations  Other (comment) (defer to next venue)    Recommendations for Other Services Rehab consult     Precautions / Restrictions Precautions Precautions:  Fall Restrictions Weight Bearing Restrictions: No      Mobility Bed Mobility Overal bed mobility: Needs Assistance Bed Mobility: Rolling, Sit to Supine, Supine to Sit Rolling: Min assist, Min guard   Supine to sit: Mod assist, +2 for physical assistance, HOB elevated Sit to supine: Min assist   General bed mobility comments: rolled to right side (easier for pt). able to advance BLE pretty well. assist to powerup trunk and steady.    Transfers Overall transfer level: Needs assistance Equipment used: Rolling walker (2 wheels) Transfers: Sit to/from Stand Sit to Stand: Mod assist, +2 physical assistance           General transfer comment: to/from EOB. Able to clear hips/buttocks fully. Stood for [employer]. side stepped 2x to the right. Assist to power-up, steady, and control descent      Balance Overall balance assessment: Needs assistance Sitting-balance support: No upper extremity supported, Feet supported Sitting balance-Leahy Scale: Good Sitting balance - Comments: able to complete grooming tasks EOB.   Standing balance support: Bilateral upper extremity supported, During functional activity, Reliant on assistive device for balance Standing balance-Leahy Scale: Poor Standing balance comment: rw and min A +2 for static standing                           ADL either performed or assessed with clinical judgement   ADL Overall ADL's : Needs assistance/impaired Eating/Feeding: Set up   Grooming: Set up;Minimal assistance;Wash/dry face;Brushing hair Grooming Details (indicate cue type and reason): able to wash face and hands with setup, assist to comb hair. Sitting EOB Upper Body Bathing: Moderate assistance;Sitting Upper Body Bathing Details (indicate cue type and reason):  assist for bathing tasks Lower Body Bathing: Maximal assistance;+2 for physical assistance;Sit to/from stand Lower Body Bathing Details (indicate cue type and reason): mod A +2 to stand,  total A for pericare Upper Body Dressing : Moderate assistance;Sitting Upper Body Dressing Details (indicate cue type and reason): assist for dressing tasks, balance is good Lower Body Dressing: Maximal assistance;+2 for physical assistance;Sit to/from stand                 General ADL Comments: Pt completed bed mobility, sat EOB several minutes then stood from EOB, pericare completed in standing. pt then sidestepped completed bed mobility.     Vision         Perception     Praxis      Pertinent Vitals/Pain Pain Assessment Pain Assessment: Faces Faces Pain Scale: Hurts worst Pain Location: back pain while trying to raise hips from bed surface laying int supine (bridging); 6-8/10 otherwise varying with activity Pain Descriptors / Indicators: Grimacing, Moaning Pain Intervention(s): Monitored during session, Limited activity within patient's tolerance, Repositioned     Hand Dominance     Extremity/Trunk Assessment Upper Extremity Assessment Upper Extremity Assessment: Generalized weakness (AROM to about 90* then limited by shoulder pain. able to touch top of head with both hands in isolation)   Lower Extremity Assessment Lower Extremity Assessment: Defer to PT evaluation   Cervical / Trunk Assessment Cervical / Trunk Assessment: Other exceptions Cervical / Trunk Exceptions: morbid obesity   Communication Communication Communication: No difficulties   Cognition Arousal/Alertness: Awake/alert Behavior During Therapy: WFL for tasks assessed/performed Overall Cognitive Status: Within Functional Limits for tasks assessed                                       General Comments  Spouse present throughout session. Pt appeared mildly cyanotic after OOB activity. Briefly on RA for this part d/t line not reaching. SpO2 assessed once EOB and was 98. reapplied [place] at 2L O2.    Exercises     Shoulder Instructions      Home Living Family/patient expects to  be discharged to:: Private residence Living Arrangements: Spouse/significant other;Children Available Help at Discharge: Family Type of Home: House Home Access: Ramped entrance     Home Layout: One level     Bathroom Shower/Tub: Runner, broadcasting/film/video: Art gallery manager;Shower seat;Grab bars - tub/shower   Additional Comments: scooter tilts, adjustable bed with no rails. Pt lives with spouse who has h/o health issues and is limited in ability to assist. Their adult son who has CP lives with them, pt's adult daughter (in college) is helping with pt's son's care (cognitive>physical).      Prior Functioning/Environment Prior Level of Function : Needs assist       Physical Assist : Mobility (physical);ADLs (physical) Mobility (physical): Transfers;Bed mobility ADLs (physical): Bathing;Dressing;Toileting;IADLs Mobility Comments: squat-pivot to motorized w/c and to shower seat. extra time/effort, painful but no  physical assist. last reported fall was 2 months ago. ADLs Comments: Pt endorses struggling with basic ADLs such as toileting, pericare, bathing/dressing.        OT Problem List: Decreased strength;Decreased range of motion;Decreased activity tolerance;Impaired balance (sitting and/or standing);Decreased knowledge of use of DME or AE;Decreased knowledge of precautions;Cardiopulmonary status limiting activity;Obesity;Pain      OT Treatment/Interventions: Self-care/ADL training;Therapeutic exercise;Energy conservation;DME and/or AE instruction;Therapeutic activities;Patient/family education;Balance training  OT Goals(Current goals can be found in the care plan section) Acute Rehab OT Goals Patient Stated Goal: pt: to need less assist with basic ADLs. spouse: to be able to go out in community with spouse. OT Goal Formulation: With patient/family Time For Goal Achievement: 04/16/22 Potential to Achieve Goals: Good  OT Frequency: Min 2X/week     Co-evaluation PT/OT/SLP Co-Evaluation/Treatment: Yes Reason for Co-Treatment: For patient/therapist safety;To address functional/ADL transfers   OT goals addressed during session: ADL's and self-care;Proper use of Adaptive equipment and DME;Strengthening/ROM      AM-PAC OT "6 Clicks" Daily Activity     Outcome Measure Help from another person eating meals?: None Help from another person taking care of personal grooming?: A Little Help from another person toileting, which includes using toliet, bedpan, or urinal?: Total Help from another person bathing (including washing, rinsing, drying)?: A Lot Help from another person to put on and taking off regular upper body clothing?: A Lot Help from another person to put on and taking off regular lower body clothing?: Total 6 Click Score: 13   End of Session Equipment Utilized During Treatment: Rolling walker (2 wheels);Oxygen (2L) Nurse Communication: Mobility status;Precautions;Other (comment) (need for bariatric recliner and EZ bari bed. Will need larger room to accomodate both items.)  Activity Tolerance: Patient tolerated treatment well Patient left: in bed;with call bell/phone within reach;with family/visitor present;with SCD's reapplied  OT Visit Diagnosis: Unsteadiness on feet (R26.81);Other abnormalities of gait and mobility (R26.89);History of falling (Z91.81);Muscle weakness (generalized) (M62.81);Pain                Time: 9563-8756 OT Time Calculation (min): 66 min Charges:  OT General Charges $OT Visit: 1 Visit OT Evaluation $OT Eval Moderate Complexity: 1 Mod OT Treatments $Self Care/Home Management : 8-22 mins  [name], OT Acute Rehabilitation Services Office: [phone]   Pilar Grammes 04/02/2022, 1:11 PM

## 2022-04-02 NOTE — Evaluation (Signed)
Physical Therapy Evaluation Patient Details Name: [name] MRN: [id number] DOB: 23-Feb-1966 Today's Date: 04/02/2022  History of Present Illness  56 year old male with urosepsis secondary to an obstructing 6 mm left ureteral stone, status post left ureteral stent placement on 03/31/2022. PMH: CHF, asthma, obesity, OSA, chronic pain, Left knee replacment and back surgery. Pt mentions an injury 20 years ago that started his disability from work  Clinical Impression  Pt very motivated and pleasant. Pt with increased weakness and deconditioning. Would like to get back to being able to do independent transfers by himself and even take a few steps to transfer without fear of falling.  Changing patients bariatric bed to a direction to promote more mobility. Pt did stand at edge of bed today, will continue working with pt get him to a more independent level to help care for himself.       Recommendations for follow up therapy are one component of a multi-disciplinary discharge planning process, led by the attending physician.  Recommendations may be updated based on patient status, additional functional criteria and insurance authorization.  Follow Up Recommendations Acute inpatient rehab (3hours/day) (if pt has to go home, recommend increased assistance/aides to help)      Assistance Recommended at Discharge Frequent or constant Supervision/Assistance  Patient can return home with the following  A lot of help with walking and/or transfers;A lot of help with bathing/dressing/bathroom;Assistance with cooking/housework    Equipment Recommendations Rolling walker (2 wheels) (needs bariatric RW)  Recommendations for Other Services       Functional Status Assessment Patient has had a recent decline in their functional status and demonstrates the ability to make significant improvements in function in a reasonable and predictable amount of time.     Precautions / Restrictions  Precautions Precautions: Fall Precaution Comments: pt reports fall within last few weeks and months and now is more fearful and weak as well Restrictions Weight Bearing Restrictions: No      Mobility  Bed Mobility Overal bed mobility: Needs Assistance Bed Mobility: Rolling, Sit to Supine, Supine to Sit Rolling: Min assist (Min A to Right , Mod/Max assit tot he left. Uses rail and does not have these at home)   Supine to sit: Mod assist, +2 for physical assistance, HOB elevated Sit to supine: Min assist, Mod assist   General bed mobility comments: rolled to right side (easier for pt). able to advance BLE pretty well. assist to powerup trunk and steady. Lots of time and lots of effort to get there. FAtigued patient a bit . Sat edge of bed with minA at time due to fatigue in sitting positon for about 10 minutes    Transfers Overall transfer level: Needs assistance Equipment used: Rolling walker (2 wheels) Transfers: Sit to/from Stand Sit to Stand: Mod assist, +2 physical assistance           General transfer comment: to/from EOB. Able to clear hips/buttocks fully. Stood for [employer]. side stepped 2x to the right. Assist to power-up, steady, and control descent. REquired great rockign effort to boost upright with RW to assist and leanign forward. Knees locked in extension. Heavy reliance on UEs . 2 small steps to right for lateral steps to head of bed,    Ambulation/Gait                  Stairs            Wheelchair Mobility    Modified Rankin (Stroke Patients Only)  Balance Overall balance assessment: Needs assistance Sitting-balance support: No upper extremity supported, Feet supported Sitting balance-Leahy Scale: Fair Sitting balance - Comments: fatigued a bit and required Mon-Mod A if was off center balance to recover out of center support   Standing balance support: Bilateral upper extremity supported, During functional activity, Reliant on  assistive device for balance Standing balance-Leahy Scale: Poor Standing balance comment: rw and min A +2 for static standing                             Pertinent Vitals/Pain Pain Assessment Faces Pain Scale: Hurts worst Pain Location: back pain while trying to raise hips from bed surface laying int supine (bridging); 6-8/10 otherwise varying with activity Pain Descriptors / Indicators: Grimacing, Moaning    Home Living Family/patient expects to be discharged to:: Private residence Living Arrangements: Spouse/significant other;Children (wife very supportive but with her cancer she fatigues easily and can only help so much.) Available Help at Discharge: Family Type of Home: House Home Access: Ramped entrance (however at this time the ramped entrnace empties into the yard where it is muddy. It was put in by workman's comp but not fully completed.)       Home Layout: One level Home Equipment: Art gallery manager;Shower seat;Grab bars - tub/shower Additional Comments: scooter tilts, adjustable bed with no rails. Pt lives with spouse who has h/o health issues and is limited in ability to assist. Their adult son who has CP lives with them, pt's adult daughter (in college) is helping with pt's son's care (cognitive>physical).    Prior Function Prior Level of Function : Needs assist       Physical Assist : Mobility (physical);ADLs (physical) Mobility (physical): Transfers;Bed mobility ADLs (physical): Bathing;Dressing;Toileting;IADLs Mobility Comments: squat-pivot to motorized w/c and to shower seat. extra time/effort, painful but no  physical assist. last reported fall was 2 months ago. ADLs Comments: Pt endorses struggling with basic ADLs such as toileting, pericare, bathing/dressing.     Hand Dominance        Extremity/Trunk Assessment   Upper Extremity Assessment Upper Extremity Assessment: Generalized weakness (AROM to about 90* then limited by shoulder pain. able to  touch top of head with both hands in isolation)    Lower Extremity Assessment Lower Extremity Assessment: RLE deficits/detail RLE Deficits / Details: R LE with increased swelling compared to LLE. R with more paina dn reports greater weakness. genreal 3-/5 for hip flexion and knee flexion, likes to be in ER postion with RLE,    Cervical / Trunk Assessment Cervical / Trunk Assessment: Other exceptions Cervical / Trunk Exceptions: morbid obesity  Communication   Communication: No difficulties  Cognition Arousal/Alertness: Awake/alert Behavior During Therapy: WFL for tasks assessed/performed Overall Cognitive Status: Within Functional Limits for tasks assessed                                          General Comments General comments (skin integrity, edema, etc.): Spouse present throughout session. Pt appeared mildly cyanotic after OOB activity. Briefly on RA for this part d/t line not reaching. SpO2 assessed once EOB and was 98. reapplied Kaplan at 2L O2.    Exercises     Assessment/Plan    PT Assessment Patient needs continued PT services  PT Problem List Decreased strength;Decreased activity tolerance;Decreased mobility;Decreased balance       PT  Treatment Interventions Gait training;DME instruction;Therapeutic activities;Therapeutic exercise;Functional mobility training;Patient/family education    PT Goals (Current goals can be found in the Care Plan section)  Acute Rehab PT Goals Patient Stated Goal: I would prefer to go home, but know I need to get stronger and better as well. wife" I just want my husband back, I want to be able to have dinner with him and him be able to do that with me" PT Goal Formulation: With patient/family Time For Goal Achievement: 04/15/22 Potential to Achieve Goals: Good    Frequency Min 3X/week     Co-evaluation PT/OT/SLP Co-Evaluation/Treatment: Yes Reason for Co-Treatment: For patient/therapist safety;Complexity of the  patient's impairments (multi-system involvement) PT goals addressed during session: Mobility/safety with mobility OT goals addressed during session: ADL's and self-care       AM-PAC PT "6 Clicks" Mobility  Outcome Measure Help needed turning from your back to your side while in a flat bed without using bedrails?: A Little Help needed moving from lying on your back to sitting on the side of a flat bed without using bedrails?: A Little Help needed moving to and from a bed to a chair (including a wheelchair)?: A Lot Help needed standing up from a chair using your arms (e.g., wheelchair or bedside chair)?: A Lot     6 Click Score: 10    End of Session   Activity Tolerance: Patient tolerated treatment well;Patient limited by fatigue Patient left: in bed;with call bell/phone within reach;with family/visitor present Nurse Communication: Mobility status PT Visit Diagnosis: Other abnormalities of gait and mobility (R26.89);Muscle weakness (generalized) (M62.81)    Time: 1761-6073 PT Time Calculation (min) (ACUTE ONLY): 62 min   Charges:   PT Evaluation $PT Eval Low Complexity: 1 Low PT Treatments $Therapeutic Activity: 8-22 mins        Clois Dupes, PT, MPT Acute Rehabilitation Services Office: [phone] If a weekend: [facility] Rehab w/e pager [phone] 04/02/2022   Marella Bile 04/02/2022, 2:33 PM

## 2022-04-02 NOTE — Plan of Care (Signed)
  Problem: Education: Goal: Ability to describe self-care measures that may prevent or decrease complications (Diabetes Survival Skills Education) will improve Outcome: Progressing   Problem: Coping: Goal: Ability to adjust to condition or change in health will improve Outcome: Progressing   Problem: Fluid Volume: Goal: Ability to maintain a balanced intake and output will improve Outcome: Progressing   

## 2022-04-02 NOTE — Progress Notes (Signed)
PROGRESS NOTE   [name]  OFH:[id number] DOB: 04/20/1965 DOA: 03/31/2022 PCP: Brian Joe, MD  Brief Narrative:  56 year old morbidly obese white male HFpEF COPD OSA (noncompliant on CPAP machine although has machines), pulmonary HTN Chronic low back pain with intrathecal pump (fentanyl/bupivacaine) which is not working IBS Prediabetes not on meds  Presented to emergency room with increasing confusion temperature 102.7 leukocytosis?  UTI with 6 mm calculus distal left ureter  12/18 Dr. Sande [name] perform cystoscopy left ureteric stent placement   Hospital-Problem based course  Complicated urinary tract infection with 6 mm stone status post left ureteric stent placement - Urine culture growing 10,000 CFU blood cultures negative x 2 days - Continue cefepime 2 g every 8 hourly and narrow as appropriate  Toxic metabolic encephalopathy on admission likely secondary to hypercarbia in the setting of multiple meds Pulmonary hypertension OSA with noncompliance - Watch mentation as is on several opiates/meds which can alter the same  Candidal intertrigo - Patient on nystatin powder twice daily and will need bariatric bed replacement -Will need to turn frequently in bed  Chronic pain syndrome/presence of Medtronic intrathecal pump -Various meds resumed as per primary physician currently Dilaudid 4 mg every 6 as needed MS Contin 30 every 12 Cymbalta 30 twice daily Lyrica 150 3 times daily Zanaflex 4 every 8 as needed -Will need echo prior to discussion of replacement of battery for pump  Diabetes mellitus type 2 last A1c 5.3 at [facility] - Sugars here ranging 130s to 140s - Would discontinue coverage monitor for now  Chronic debility - Ambulatory dysfunction with inability to walk for the past 18 months as is bedbound - TOC assisting with discussion-patient will need significant assistance given inability of wife to care for him appropriately at home and assist  with management of chronic illnesses   DVT prophylaxis: SCD Code Status: Full Family Communication: D/W wife [name] [phone] at the bedside Disposition:  Status is: Inpatient Remains inpatient appropriate because:   Needs narrowing of antibiotics inpatient and then discussion about next likely steps with regards to disposition and management of ambulatory dysfunction Hopeful candidate for CIR?   Consultants:  Urologist  Procedures: As above  Antimicrobials: Cefepime   Subjective: Comfortable appearing in the process of passing urine No distress currently-received message from therapy services regarding bed as well as need for Interdry given chronic pannus intertrigo No chest pain no fever no chills  Objective: Vitals:   04/01/22 1652 04/01/22 1900 04/01/22 2046 04/02/22 0453  BP: (!) 116/58 109/74 109/74 112/70  Pulse: 70 75 95 74  Resp: 17 18 20 18   Temp: 98 F (36.7 C) 98.1 F (36.7 C) 98 F (36.7 C) 97.8 F (36.6 C)  TempSrc: Oral Oral Oral Oral  SpO2: 99% 96% 99% 100%  Weight:      Height:        Intake/Output Summary (Last 24 hours) at 04/02/2022 1319 Last data filed at 04/02/2022 04/04/2022 Gross per 24 hour  Intake 1020.22 ml  Output 3475 ml  Net -2454.78 ml   Filed Weights   04/01/22 0225  Weight: (!) 161 kg    Examination:  EOMI NCAT no icterus no pallor no rales no rhonchi Thick neck Mallampati 4 S1-S2 no murmur no rub no gallop Abdomen obese with fungal intertrigo No lower extremity edema versus trace No jerks or other motion neuro intact and coherent  Data Reviewed: personally reviewed   CBC    Component Value Date/Time   WBC 7.5  04/02/2022 0408   RBC 3.68 (L) 04/02/2022 0408   HGB 11.0 (L) 04/02/2022 0408   HCT 35.7 (L) 04/02/2022 0408   PLT 265 04/02/2022 0408   MCV 97.0 04/02/2022 0408   MCH 29.9 04/02/2022 0408   MCHC 30.8 04/02/2022 0408   RDW 12.8 04/02/2022 0408   LYMPHSABS 1.6 04/02/2022 0408   MONOABS 0.6  04/02/2022 0408   EOSABS 0.1 04/02/2022 0408   BASOSABS 0.0 04/02/2022 0408      Latest Ref Rng & Units 04/02/2022    4:08 AM 04/01/2022    4:35 AM 03/31/2022    4:49 PM  CMP  Glucose 70 - 99 mg/dL 956  213  086   BUN 6 - 20 mg/dL 13  15  15    Creatinine 0.61 - 1.24 mg/dL  5.78  4.69   Sodium 135 - 145 mmol/L 135  134  134   Potassium 3.5 - 5.1 mmol/L 3.3  3.9  3.9   Chloride 98 - 111 mmol/L 99  100  95   CO2 22 - 32 mmol/L 26  25    Calcium 8.9 - 10.3 mg/dL 8.3  8.1    Total Protein 6.5 - 8.1 g/dL 6.7     Total Bilirubin 0.3 - 1.2 mg/dL 0.6     Alkaline Phos 38 - 126 U/L 54     AST 15 - 41 U/L 38     ALT 0 - 44 U/L 31        Radiology Studies: DG C-Arm 1-60 Min-No Report  Result Date: 04/01/2022 Fluoroscopy was utilized by the requesting physician.  No radiographic interpretation.   CT ABDOMEN PELVIS W CONTRAST  Result Date: 03/31/2022 CLINICAL DATA:  Abdominal pain. EXAM: CT ABDOMEN AND PELVIS WITH CONTRAST TECHNIQUE: Multidetector CT imaging of the abdomen and pelvis was performed using the standard protocol following bolus administration of intravenous contrast. RADIATION DOSE REDUCTION: This exam was performed according to the departmental dose-optimization program which includes automated exposure control, adjustment of the mA and/or kV according to patient size and/or use of iterative reconstruction technique. CONTRAST:  04/02/2022 OMNIPAQUE IOHEXOL 300 MG/ML  SOLN COMPARISON:  CT abdomen and pelvis 06/10/2018 FINDINGS: Lower chest: No acute abnormality. Hepatobiliary: The liver is enlarged. There is diffuse fatty infiltration of the liver. The gallbladder bile ducts are within normal limits. Pancreas: Unremarkable. No pancreatic ductal dilatation or surrounding inflammatory changes. Spleen: Normal in size without focal abnormality. Adrenals/Urinary Tract: There is a 6 mm calculus in the distal left ureter. There is mild left-sided hydroureteronephrosis and mild left  perinephric fat stranding. There are additional nonobstructing bilateral renal calculi; the largest is in the right kidney measuring 11 mm. There is no right-sided hydronephrosis. There is mild bladder wall thickening with surrounding inflammation. There is also mild inflammatory stranding surrounding the bilateral distal ureters. The adrenal glands are within normal limits. Stomach/Bowel: Stomach is within normal limits. No evidence of bowel wall thickening, distention, or inflammatory changes. There are postsurgical changes in the stomach. Appendix is not visualized. Vascular/Lymphatic: No significant vascular findings are present. No enlarged abdominal or pelvic lymph nodes. Reproductive: Prostate is unremarkable. Other: No abdominal wall hernia or abnormality. No abdominopelvic ascites. Musculoskeletal: Postsurgical changes/disc spaces are seen at L3-L4 and L4-L5. thoracic spinal cord stimulator device again seen. IMPRESSION: 1. 6 mm calculus in the distal left ureter with mild obstructive uropathy. 2. Additional nonobstructing bilateral renal calculi. 3. Mild bladder wall thickening with surrounding inflammation concerning for cystitis. There is also mild  inflammatory stranding surrounding the distal ureters bilaterally which may be related to infection. 4. Hepatomegaly and hepatic steatosis. Electronically Signed   By: Darliss Cheney M.D.   On: 03/31/2022 20:59   CT Head Wo Contrast  Result Date: 03/31/2022 CLINICAL DATA:  Altered mental status.  Headache and fever. EXAM: CT HEAD WITHOUT CONTRAST TECHNIQUE: Contiguous axial images were obtained from the base of the skull through the vertex without intravenous contrast. RADIATION DOSE REDUCTION: This exam was performed according to the departmental dose-optimization program which includes automated exposure control, adjustment of the mA and/or kV according to patient size and/or use of iterative reconstruction technique. COMPARISON:  CT head dated Aug 25, 2019 FINDINGS: Brain: No evidence of acute infarction, hemorrhage, hydrocephalus, extra-axial collection or mass lesion/mass effect. Vascular: No hyperdense vessel or unexpected calcification. Skull: Normal. Negative for fracture or focal lesion. Sinuses/Orbits: No acute finding. Other: None. IMPRESSION: No acute intracranial pathology. Electronically Signed   By: Larose Hires D.O.   On: 03/31/2022 20:59   DG Chest Port 1 View  Result Date: 03/31/2022 CLINICAL DATA:  Possible sepsis. EXAM: PORTABLE CHEST 1 VIEW COMPARISON:  08/24/2019 FINDINGS: Lungs are somewhat hypoinflated demonstrate no focal airspace consolidation or effusion. Mild stable cardiomegaly. Remainder of the exam is unchanged. IMPRESSION: 1. Hypoinflation without acute cardiopulmonary disease. 2. Mild stable cardiomegaly. Electronically Signed   By: Elberta Fortis M.D.   On: 03/31/2022 16:56     Scheduled Meds:  aspirin EC  81 mg Oral Daily   DULoxetine  30 mg Oral BID   influenza vac split quadrivalent PF  0.5 mL Intramuscular Tomorrow-1000   insulin aspart  0-15 Units Subcutaneous TID WC   morphine  30 mg Oral Q12H   nystatin   Topical BID   pantoprazole  80 mg Oral Q1200   pneumococcal 20-valent conjugate vaccine  0.5 mL Intramuscular Tomorrow-1000   pregabalin  150 mg Oral TID   Continuous Infusions:  ceFEPime (MAXIPIME) IV 2 g (04/02/22 0806)     LOS: 2 days   Time spent: 73  Rhetta Mura, MD [facility] To contact the attending provider between 7A-7P or the covering provider during after hours 7P-7A, please log into the web site [email or web address] and access using universal [place] password for that web site. If you do not have the password, please call the hospital operator.  04/02/2022, 1:19 PM

## 2022-04-03 DIAGNOSIS — N390 Urinary tract infection, site not specified: Secondary | ICD-10-CM | POA: Diagnosis not present

## 2022-04-03 LAB — URINE CULTURE: Culture: 10000 — AB

## 2022-04-03 LAB — GLUCOSE, CAPILLARY
Glucose-Capillary: 79 mg/dL (ref 70–99)
Glucose-Capillary: 102 mg/dL — ABNORMAL HIGH (ref 70–99)
Glucose-Capillary: 110 mg/dL — ABNORMAL HIGH (ref 70–99)
Glucose-Capillary: 110 mg/dL — ABNORMAL HIGH (ref 70–99)

## 2022-04-03 LAB — HEMOGLOBIN A1C
Hgb A1c MFr Bld: 6.3 % — ABNORMAL HIGH (ref 4.8–5.6)
Mean Plasma Glucose: 134 mg/dL

## 2022-04-03 MED ORDER — CEFDINIR 300 MG PO CAPS: 600.0000 mg | ORAL_CAPSULE | Freq: Two times a day (BID) | ORAL | Status: DC

## 2022-04-03 MED ORDER — CEPHALEXIN 500 MG PO CAPS
500.0000 mg | ORAL_CAPSULE | Freq: Two times a day (BID) | ORAL | Status: DC
  Administered 2022-04-03 – 2022-04-04 (×3): 500 mg via ORAL
  Filled 2022-04-03 (×3): qty 1

## 2022-04-03 NOTE — NC FL2 (Addendum)
[place] LEVEL OF CARE FORM     IDENTIFICATION  Patient Name: [name] Birthdate: [birth year] Sex: male Admission Date (Current Location): 03/31/2022  [facility] and [place] Number:  Herbalist and Address:  [facility],  [place], [place]      Provider Number: [id number]  Attending Physician Name and Address:  Nita Sells, MD  Relative Name and Phone Number:  Eden Manetta [phone]    Current Level of Care: Hospital Recommended Level of Care: [place] Prior Approval Number:    Date Approved/Denied:   PASRR Number: [id number]  Discharge Plan: SNF    Current Diagnoses: Patient Active Problem List   Diagnosis Date Noted   Acute metabolic encephalopathy 123456   Complicated UTI (urinary tract infection) 04/01/2022   Sepsis secondary urosepsis/stone 03/31/2022   Candidal intertrigo 03/31/2022   Opioid use disorder 01/06/2022   Genetic testing (CPY450 2D6: Normal activity) 11/21/2021   Abnormal MRI, lumbar spine (11/21/2021) 11/21/2021   Displacement of spinal infusion catheter 09/19/2021   Malfunction of intrathecal infusion pump ([place]) 09/19/2021   End of battery life of intrathecal infusion pump 09/19/2021   Irritable bowel syndrome 07/16/2021   Mild persistent asthma 07/16/2021   Moderate major depression, single episode ([place]) 07/16/2021   Pure hypercholesterolemia 07/16/2021   Urinary incontinence 07/16/2021   Chronic respiratory failure ([place]) 07/16/2021   Constipation 07/16/2021   Sleep apnea 07/16/2021   Type 2 diabetes mellitus with other specified complication ([place]) 99991111   Epidural fibrosis 11/22/2020   Weakness of lower extremities (Bilateral) 11/22/2020   Weakness of back 11/22/2020   Lumbosacral spondylosis with radiculopathy 11/22/2020   Physical deconditioning 11/22/2020   Type 2 diabetes mellitus with diabetic polyneuropathy, without long-term current  use of insulin ([place]) 11/22/2020   Type 2 diabetes mellitus with morbid obesity ([place]) 11/22/2020   Physical tolerance to opiate drug 11/22/2020   Unable to ambulate 11/22/2020   Chronic use of opiate for therapeutic purpose 123456   Uncomplicated opioid dependence ([place]) 03/15/2020   Restrictive airway disease 01/24/2020   Elevated blood-pressure reading, without diagnosis of hypertension 04/19/2019   Preoperative testing 09/13/2018   Altered mental status 07/11/2018   Fever    Myoclonic jerking    Obesity, morbid (more than 100 lbs over ideal weight or BMI > 40) ([place]) 06/30/2018   DDD (degenerative disc disease), thoracic 06/30/2018   Osteoarthritis of lumbar spine 06/30/2018   History of acute alcoholic pancreatitis [id number]   Male hypogonadism 06/30/2018   Vitamin D deficiency 06/30/2018   Chronic hyperglycemia 06/30/2018   Alcoholism ([place]) 06/18/2018   Alcohol-induced acute pancreatitis without infection or necrosis [id number]   Acute alcoholic pancreatitis [id number]   Hypokalemia 06/11/2018   Hyponatremia 06/11/2018   Chronic diastolic heart failure ([place]) 06/11/2018   Nausea and vomiting 06/10/2018   Abdominal pain 06/10/2018   Encounter for screening colonoscopy 06/10/2018   Morbid obesity with BMI of 45.0-49.9, adult ([place]) 05/25/2018   Secondary osteoarthritis of multiple sites 05/25/2018   Osteoarthritis of hip (Right) 05/25/2018   Encounter for adjustment or management of infusion pump 04/01/2018   Lumbar spondylosis 01/26/2018   Spondylosis without myelopathy or radiculopathy, lumbosacral region 12/02/2017   Lumbar facet syndrome (Bilateral) (R>L) 12/02/2017   Chronic low back pain (1ry area of Pain) (Bilateral) (R>L) w/o sciatica 12/02/2017   Chronic knee pain after total replacement (Left) 12/01/2017   Chronic musculoskeletal pain 11/17/2017   Muscle spasticity 11/17/2017   Gastroesophageal reflux disease  11/17/2017   Neurogenic pain 11/17/2017   Drug-induced  nausea and vomiting 11/17/2017   Therapeutic opioid-induced constipation (OIC) 11/17/2017   Chronic pain syndrome 11/09/2017   Long term prescription benzodiazepine use 11/09/2017   Long term prescription opiate use 11/09/2017   Opiate use (140 MME/day) (oral) 11/09/2017   Long-term use of high-risk medication 11/09/2017   Pharmacologic therapy 11/09/2017   Disorder of skeletal system 11/09/2017   Problems influencing health status 11/09/2017   Chronic nausea s/p bariatric surgery 11/09/2017   Presence of intrathecal pump ([employer]tronics) 11/09/2017   Encounter for interrogation of infusion pump 11/09/2017   Chronic foot pain (Right) 11/09/2017   Chronic hip pain (Right) 11/09/2017   Old tear of medial meniscus of knee (Left) 11/09/2017   Failed back surgical syndrome 11/09/2017   Chronic low back pain (Bilateral) w/ sciatica (Bilateral) 11/09/2017   Chronic lower extremity pain (2ry area of Pain) (Bilateral) (L>R) 11/09/2017   Chronic lower extremity radicular pain 11/09/2017   Chronic shoulder pain (Bilateral) 11/09/2017   History of total knee replacement (Left) 11/09/2017   History of arthroscopic surgery of shoulder (Bilateral) 11/09/2017   History of bariatric surgery (September 2016) 123456   OSA complicated by mild polycythemia  10/21/2014   Polycythemia, secondary 10/21/2014   Dyspnea 10/20/2014   S/P rotator cuff repair (Bilateral) 08/18/2013   DDD (degenerative disc disease), lumbosacral 10/20/2006    Orientation RESPIRATION BLADDER Height & Weight     Self, Time, Situation, Place  Normal Continent Weight: (Abnormal) 161 kg Height:  5\' 11"  (180.3 cm)  BEHAVIORAL SYMPTOMS/MOOD NEUROLOGICAL BOWEL NUTRITION STATUS      Continent Diet (regular)  AMBULATORY STATUS COMMUNICATION OF NEEDS Skin   Extensive Assist Verbally Normal                       Personal Care Assistance Level of Assistance  Bathing, Feeding, Dressing Bathing Assistance: Limited  assistance Feeding assistance: Limited assistance Dressing Assistance: Limited assistance     Functional Limitations Info  Sight, Hearing, Speech Sight Info: Adequate Hearing Info: Adequate Speech Info: Adequate    SPECIAL CARE FACTORS FREQUENCY  PT (By licensed PT), OT (By licensed OT)     PT Frequency: 5 x weekly OT Frequency: 5 x weekly            Contractures Contractures Info: Not present    Additional Factors Info  Code Status Code Status Info: full             Current Medications (04/03/2022):  This is the current hospital active medication list Current Facility-Administered Medications  Medication Dose Route Frequency Provider Last Rate Last Admin   acetaminophen (TYLENOL) tablet 650 mg  650 mg Oral Q6H PRN Orma Flaming, MD       Or   acetaminophen (TYLENOL) suppository 650 mg  650 mg Rectal Q6H PRN Orma Flaming, MD       aspirin EC tablet 81 mg  81 mg Oral Daily Orma Flaming, MD   81 mg at 04/03/22 [id number]   ceFEPIme (MAXIPIME) 2 g in sodium chloride 0.9 % 100 mL IVPB  2 g Intravenous Q8H Angela Adam, RPH 200 mL/hr at 04/03/22 0751 2 g at 04/03/22 0751   DULoxetine (CYMBALTA) DR capsule 30 mg  30 mg Oral BID Orma Flaming, MD   30 mg at 04/03/22 [id number]   HYDROmorphone (DILAUDID) tablet 4 mg  4 mg Oral Q6H PRN Orma Flaming, MD   4 mg at 04/03/22 0104  insulin aspart (novoLOG) injection 0-15 Units  0-15 Units Subcutaneous TID WC Orland Mustard, MD   2 Units at 04/02/22 1813   morphine (MS CONTIN) 12 hr tablet 30 mg  30 mg Oral Q12H Orland Mustard, MD   30 mg at 04/03/22 8299   nystatin (MYCOSTATIN/NYSTOP) topical powder   Topical BID Orland Mustard, MD   Given at 04/03/22 0927   ondansetron [facility]) tablet 4 mg  4 mg Oral Q6H PRN Orland Mustard, MD       Or   ondansetron [facility]) injection 4 mg  4 mg Intravenous Q6H PRN Orland Mustard, MD   4 mg at 04/02/22 0111   Oral care mouth rinse  15 mL Mouth Rinse PRN Orland Mustard, MD       pantoprazole  (PROTONIX) EC tablet 80 mg  80 mg Oral Q1200 Orland Mustard, MD   80 mg at 04/02/22 1324   pregabalin (LYRICA) capsule 150 mg  150 mg Oral TID Marinda Elk, MD   150 mg at 04/03/22 0927   tiZANidine (ZANAFLEX) tablet 4 mg  4 mg Oral Q8H PRN Orland Mustard, MD   4 mg at 04/02/22 1609   torsemide (DEMADEX) tablet 40 mg  40 mg Oral Daily PRN Orland Mustard, MD         Discharge Medications: Please see discharge summary for a list of discharge medications.  Relevant Imaging Results:  Relevant Lab Results:   Additional Information [id number]  Golda Acre, RN

## 2022-04-03 NOTE — Progress Notes (Signed)
PROGRESS NOTE   [name]  POE:[id number] DOB: Jul 20, 1965 DOA: 03/31/2022 PCP: [name], MD  Brief Narrative:  56 year old morbidly obese white male HFpEF COPD OSA (noncompliant on CPAP machine although has machines), pulmonary HTN Chronic low back pain with intrathecal pump (fentanyl/bupivacaine) which is not working IBS Prediabetes not on meds  Presented to emergency room with increasing confusion temperature 102.7 leukocytosis?  UTI with 6 mm calculus distal left ureter  12/18 Dr. Sande Brothers perform cystoscopy left ureteric stent placement   Hospital-Problem based course  Complicated urinary tract infection with 6 mm stone status post left ureteric stent placement - Urine culture growing 10,000 CFU blood cultures negative x 2 days - Cefepime transition to Keflex as no specific growth  Toxic metabolic encephalopathy on admission likely secondary to hypercarbia in the setting of multiple meds Pulmonary hypertension OSA with noncompliance - Mentation has much improved and patient is on home meds  Candidal intertrigo - Patient on nystatin powder twice daily and will need bariatric bed replacement -Will need to turn frequently in bed  Chronic pain syndrome/presence of Medtronic intrathecal pump -Various meds resumed as per primary physician currently Dilaudid 4 mg every 6 as needed MS Contin 30 every 12 Cymbalta 30 twice daily Lyrica 150 3 times daily Zanaflex 4 every 8 as needed -Will need echo prior to discussion of replacement of battery for pump  Diabetes mellitus type 2 last A1c 6.3 - Sugars here ranging 70-100 - Would discontinue coverage monitor for now  Chronic debility - Ambulatory dysfunction with inability to walk for the past 18 months as is bedbound - TOC assisting with discussion-awaiting further discussion   DVT prophylaxis: SCD Code Status: Full Family Communication: D/W wife Brockton Mckesson [phone] at the bedside on 12/20 Disposition:  Status  is: Inpatient Remains inpatient appropriate because:   May require home health versus skilled awaiting input from [facility]   Consultants:  Urologist  Procedures: As above  Antimicrobials: Cefepime   Subjective:  Coherent alert no distress looks comfortable no chest pain no fever  Objective: Vitals:   04/02/22 1332 04/02/22 1416 04/02/22 1937 04/03/22 0601  BP: 110/64  115/67 129/73  Pulse: 85  80 72  Resp: 20  17 18   Temp: 98 F (36.7 C)  98.3 F (36.8 C) 97.7 F (36.5 C)  TempSrc: Oral  Oral Oral  SpO2: 98% 98% 99% 100%  Weight:      Height:        Intake/Output Summary (Last 24 hours) at 04/03/2022 1635 Last data filed at 04/03/2022 1137 Gross per 24 hour  Intake 940 ml  Output 1800 ml  Net -860 ml    Filed Weights   04/01/22 0225  Weight: (!) 161 kg    Examination:  EOMI NCAT no icterus no pallor no rales no rhonchi Thick neck Mallampati 4 S1-S2 no murmur no rub no gallop Abdomen obese with fungal intertrigo No lower extremity edema versus trace No jerks or other motion neuro intact and coherent  Data Reviewed: personally reviewed   CBC    Component Value Date/Time   WBC 7.5 04/02/2022 0408   RBC 3.68 (L) 04/02/2022 0408   HGB 11.0 (L) 04/02/2022 0408   HCT 35.7 (L) 04/02/2022 0408   PLT 265 04/02/2022 0408   MCV 97.0 04/02/2022 0408   MCH 29.9 04/02/2022 0408   MCHC 30.8 04/02/2022 0408   RDW 12.8 04/02/2022 0408   LYMPHSABS 1.6 04/02/2022 0408   MONOABS 0.6 04/02/2022 0408   EOSABS 0.1  04/02/2022 0408   BASOSABS 0.0 04/02/2022 0408      Latest Ref Rng & Units 04/02/2022    4:08 AM 04/01/2022    4:35 AM 03/31/2022    4:49 PM  CMP  Glucose 70 - 99 mg/dL 191  660  600   BUN 6 - 20 mg/dL 13  15  15    Creatinine 0.61 - 1.24 mg/dL  4.59  9.77   Sodium 135 - 145 mmol/L 135  134  134   Potassium 3.5 - 5.1 mmol/L 3.3  3.9  3.9   Chloride 98 - 111 mmol/L 99  100  95   CO2 22 - 32 mmol/L 26  25    Calcium 8.9 - 10.3 mg/dL 8.3  8.1     Total Protein 6.5 - 8.1 g/dL 6.7     Total Bilirubin 0.3 - 1.2 mg/dL 0.6     Alkaline Phos 38 - 126 U/L 54     AST 15 - 41 U/L 38     ALT 0 - 44 U/L 31        Radiology Studies: No results found.   Scheduled Meds:  aspirin EC  81 mg Oral Daily   cephALEXin  500 mg Oral Q12H   DULoxetine  30 mg Oral BID   insulin aspart  0-15 Units Subcutaneous TID WC   morphine  30 mg Oral Q12H   nystatin   Topical BID   pantoprazole  80 mg Oral Q1200   pregabalin  150 mg Oral TID   Continuous Infusions:     LOS: 3 days   Time spent: 42  59, MD [facility] To contact the attending provider between 7A-7P or the covering provider during after hours 7P-7A, please log into the web site [email or web address] and access using universal [place] password for that web site. If you do not have the password, please call the hospital operator.  04/03/2022, 4:35 PM

## 2022-04-03 NOTE — TOC Progression Note (Signed)
Transition of Care [facility]) - Progression Note    Patient Details  Name: [name] MRN: [id number] Date of Birth: 09/04/1965  [facility]) CM/SW Contact  Golda Acre, RN Phone Number: 04/03/2022, 11:04 AM  Clinical Narrative:    Clovis Cao sent out to area and surrounding area for snf review, will be difficult to place due to wt and size of patient.        Expected Discharge Plan and Services   Discharge Planning Services: CM Consult   Living arrangements for the past 2 months: Single Family Home                                       Social Determinants of Health (SDOH) Interventions SDOH Screenings   Food Insecurity: Food Insecurity Present (04/01/2022)  Housing: Low Risk  (04/01/2022)  Transportation Needs: No Transportation Needs (04/01/2022)  Utilities: Not At Risk (04/01/2022)  Depression (PHQ2-9): Low Risk  (03/19/2021)  Tobacco Use: High Risk (04/01/2022)    Readmission Risk Interventions   No data to display

## 2022-04-03 NOTE — TOC Progression Note (Addendum)
Transition of Care [facility]) - Progression Note    Patient Details  Name: [name] MRN: [id number] Date of Birth: 1966-03-15  Transition of Care [facility]) CM/SW Contact  Golda Acre, RN Phone Number: 04/03/2022, 1:22 PM  Clinical Narrative:    [place] and is legal guardian/ message left to please call back. Auth for genesis meridian sent to [facility] for review. At 1412       Expected Discharge Plan and Services   Discharge Planning Services: CM Consult   Living arrangements for the past 2 months: Single Family Home                                       Social Determinants of Health (SDOH) Interventions SDOH Screenings   Food Insecurity: Food Insecurity Present (04/01/2022)  Housing: Low Risk  (04/01/2022)  Transportation Needs: No Transportation Needs (04/01/2022)  Utilities: Not At Risk (04/01/2022)  Depression (PHQ2-9): Low Risk  (03/19/2021)  Tobacco Use: High Risk (04/01/2022)    Readmission Risk Interventions   No data to display

## 2022-04-04 DIAGNOSIS — N390 Urinary tract infection, site not specified: Secondary | ICD-10-CM | POA: Diagnosis not present

## 2022-04-04 LAB — GLUCOSE, CAPILLARY
Glucose-Capillary: 97 mg/dL (ref 70–99)
Glucose-Capillary: 85 mg/dL (ref 70–99)

## 2022-04-04 MED ORDER — CEPHALEXIN 500 MG PO CAPS: 500.0000 mg | ORAL_CAPSULE | Freq: Two times a day (BID) | ORAL | 0 refills | Status: AC

## 2022-04-04 MED ORDER — POLYETHYLENE GLYCOL 3350 17 G PO PACK
17.0000 g | PACK | Freq: Every day | ORAL | Status: DC
  Administered 2022-04-04: 17 g via ORAL
  Filled 2022-04-04: qty 1

## 2022-04-04 MED ORDER — "INTERDRY 10""X144"" EX SHEE": 1.0000 "application " | MEDICATED_PATCH | Freq: Two times a day (BID) | CUTANEOUS | 0 refills | Status: DC

## 2022-04-04 MED ORDER — NYSTATIN 100000 UNIT/GM EX POWD: Freq: Two times a day (BID) | CUTANEOUS | 0 refills | Status: AC

## 2022-04-04 NOTE — Care Management Important Message (Signed)
Important Message  Patient Details IM Letter placed in room. Name: [name] MRN: [id number] Date of Birth: 07-May-1965   Medicare Important Message Given:  Yes     Caren Macadam 04/04/2022, 11:41 AM

## 2022-04-04 NOTE — TOC Transition Note (Addendum)
Transition of Care [facility]) - CM/SW Discharge Note   Patient Details  Name: [name] MRN: [id number] Date of Birth: October 28, 1965  Transition of Care [facility]) CM/SW Contact:  Adrian Prows, RN Phone Number: [phone] 04/04/2022, 1:44 PM   Clinical Narrative:    D/C orders received for pt; orders received for HHPT/OT; spoke w/ in room and the pt says he has a ride home; he agrees to services and has no agency preference; contacted Marylene Land at [place], Morrie Sheldon at [place], Kandee Keen at [place], awaiting responses; the pt also has home oxygen but he is not sure of agency; the pt says he has a travel tank at home but he only uses it for to get to long MD appts and at night; he says he does not need one to get home; LVM for pt's wife [name] [phone]) to get agency name; contacted [employer], [place], [employer], [employer], [address] [place]) and they all say their agency does not service pt; contacted [employer] (spoke w/ Wille Glaser) and she says they do not have a record of agency.  -1402- notified by Kandee Keen at [place] the agency can provide services; pt notified; [facility] contact information added to follow up provider section of d/c summary.  -1531- unable to identify home oxygen agency; no call back from pt's wife; Dr Mahala Menghini notified; he says pt can go home without oxygen if he is comfortable; pt notified and agrees; pt also notified contact information for [facility] placed in follow up provider section of d/c instructions; no TOC needs.          Patient Goals and CMS Choice   Choice offered to / list presented to : [facility] POA / Guardian  Discharge Placement                         Discharge Plan and Services Additional resources added to the After Visit Summary for     Discharge Planning Services: CM Consult                                 Social Determinants of Health (SDOH) Interventions SDOH Screenings   Food Insecurity: Food Insecurity Present (04/01/2022)   Housing: Low Risk  (04/01/2022)  Transportation Needs: No Transportation Needs (04/01/2022)  Utilities: Not At Risk (04/01/2022)  Depression (PHQ2-9): Low Risk  (03/19/2021)  Tobacco Use: High Risk (04/01/2022)     Readmission Risk Interventions     No data to display

## 2022-04-04 NOTE — Progress Notes (Signed)
Occupational Therapy Treatment Patient Details Name: [name] MRN: [id number] DOB: [birth year] Today's Date: 04/04/2022   History of present illness 56 year old male with urosepsis secondary to an obstructing 6 mm left ureteral stone, status post left ureteral stent placement on 03/31/2022. PMH: CHF, asthma, obesity, OSA, chronic pain, Left knee replacment and back surgery. Pt mentions an injury 20 years ago that started his disability from work   OT comments  Treatment focused on functional mobility needed to advance ADLs. Patient able to transfer to edge of bed with supervision using bed rails. He was min guard to stand and take steps to recliner. He demonstrated good balance at edge of bed and perched at edge of chair with the ability to lean left and right and somewhat forward. He exhibited good upper body strength and able to scoot himself back in the chair. He reports no specific increase in weakness. His functional abilities are heavily reliant on his scooter and home environment. He reports feeling like his baseline. He does admit to some struggles at home. Recommend HH OT at discharge - to work on patient improving his functional abilities in his home environment.   Recommendations for follow up therapy are one component of a multi-disciplinary discharge planning process, led by the attending physician.  Recommendations may be updated based on patient status, additional functional criteria and insurance authorization.    Follow Up Recommendations  Home health OT     Assistance Recommended at Discharge Intermittent Supervision/Assistance  Patient can return home with the following  A little help with bathing/dressing/bathroom;Assistance with cooking/housework;Help with stairs or ramp for entrance;Assist for transportation   Equipment Recommendations  Other (comment) (defer to next venue)    Recommendations for Other Services      Precautions / Restrictions  Precautions Precautions: Fall Precaution Comments: History of Falls Restrictions Weight Bearing Restrictions: No       Mobility Bed Mobility Overal bed mobility: Needs Assistance Bed Mobility: Supine to Sit Rolling: Supervision   Supine to sit: Supervision, HOB elevated     General bed mobility comments: Supervision to transfer to edge of bed and use of bed rails.    Transfers Overall transfer level: Needs assistance Equipment used: Rolling walker (2 wheels) Transfers: Sit to/from Stand, Bed to chair/wheelchair/BSC Sit to Stand: Min guard, +2 safety/equipment     Step pivot transfers: Min guard     General transfer comment: Patient able to take 3-4 steps to recliner with use of walker. No physical assistance. Patient able to use upper extremities to scoot himself back in chair.     Balance Overall balance assessment: Needs assistance Sitting-balance support: No upper extremity supported, Feet supported Sitting balance-Leahy Scale: Good Sitting balance - Comments: ablle to sit edge of bed and perch at edge of recilner. Able to lean left and right and forard - but not all the way forard due to body habitus and back pain   Standing balance support: During functional activity, Reliant on assistive device for balance Standing balance-Leahy Scale: Poor                             ADL either performed or assessed with clinical judgement   ADL  Extremity/Trunk Assessment Upper Extremity Assessment Upper Extremity Assessment: Overall WFL for tasks assessed   Lower Extremity Assessment Lower Extremity Assessment: Defer to PT evaluation   Cervical / Trunk Assessment Cervical / Trunk Exceptions: morbid obesity    Vision Patient Visual Report: No change from baseline     Perception     Praxis      Cognition Arousal/Alertness: Awake/alert Behavior During Therapy: WFL for tasks  assessed/performed Overall Cognitive Status: Within Functional Limits for tasks assessed                                 General Comments: Patient is pleasant and agreeable.        Exercises      Shoulder Instructions       General Comments      Pertinent Vitals/ Pain       Pain Assessment Pain Assessment: Faces Faces Pain Scale: Hurts little more Pain Location: low back - generalized Pain Descriptors / Indicators: Aching Pain Intervention(s): Monitored during session  Home Living                                          Prior Functioning/Environment              Frequency  Min 2X/week        Progress Toward Goals  OT Goals(current goals can now be found in the care plan section)  Progress towards OT goals: Progressing toward goals  Acute Rehab OT Goals Patient Stated Goal: Less assist with ADLs OT Goal Formulation: With patient Time For Goal Achievement: 04/16/22 Potential to Achieve Goals: Good  Plan Discharge plan needs to be updated    Co-evaluation          OT goals addressed during session:  (functional mobility)      AM-PAC OT "6 Clicks" Daily Activity     Outcome Measure   Help from another person eating meals?: None Help from another person taking care of personal grooming?: A Little Help from another person toileting, which includes using toliet, bedpan, or urinal?: Total (external catheter) Help from another person bathing (including washing, rinsing, drying)?: A Lot Help from another person to put on and taking off regular upper body clothing?: A Little Help from another person to put on and taking off regular lower body clothing?: A Lot 6 Click Score: 15    End of Session Equipment Utilized During Treatment: Rolling walker (2 wheels);Oxygen  OT Visit Diagnosis: Unsteadiness on feet (R26.81);Other abnormalities of gait and mobility (R26.89);History of falling (Z91.81);Muscle weakness (generalized)  (M62.81);Pain   Activity Tolerance Patient tolerated treatment well   Patient Left in chair;with call bell/phone within reach;with chair alarm set (lift pad if needed)   Nurse Communication          Time: 3546-5681 OT Time Calculation (min): 21 min  Charges: OT General Charges $OT Visit: 1 Visit OT Treatments $Therapeutic Activity: 8-22 mins  Donnella Sham, OTR/L Acute Care Rehab Services  Office [phone]   Kelli Churn 04/04/2022, 11:52 AM

## 2022-04-04 NOTE — Progress Notes (Signed)
Patient discharged home.  Discharge instructions explained, patient verbalizes understanding 

## 2022-04-04 NOTE — Discharge Summary (Addendum)
Physician Discharge Summary  [name] TDV:[id number] DOB: 01-07-1966 DOA: 03/31/2022  PCP: Antony Contras, MD  Admit date: 03/31/2022 Discharge date: 04/04/2022  Time spent: 33 minutes  Recommendations for Outpatient Follow-up:  Will need close follow-up with outpatient physician and pain management referral Recommend CBC and Chem-12 in outpatient setting Recommend outpatient follow-up with Dr. Gilford Rile in about 2 weeks posthospitalization  Discharge Diagnoses:  MAIN problem for hospitalization   6 mm left ureteric stone status post stent 12/18 with sepsis on admission HFpEF Chronic low back pain with nonfunctioning intrathecal pump Prediabetes Super morbid obesity OSA not compliant with CPAP  Please see below for itemized issues addressed in [place]- refer to other progress notes for clarity if needed  Discharge Condition: Fair  Diet recommendation: Regular  Filed Weights   04/01/22 0225  Weight: (!) 161 kg    History of present illness:  56 year old morbidly obese white male HFpEF COPD OSA (noncompliant on CPAP machine although has machines), pulmonary HTN Chronic low back pain with intrathecal pump (fentanyl/bupivacaine) which is not working IBS Prediabetes not on meds   Presented to emergency room with increasing confusion temperature 102.7 leukocytosis?  UTI with 6 mm calculus distal left ureter   12/18 Dr. Gilford Rile perform cystoscopy left ureteric stent placement  Hospital Course:  Complicated urinary tract infection with 6 mm stone status post left ureteric stent placement - Urine culture growing 10,000 CFU blood cultures negative x 2 days - Cefepime was transitioned to Keflex on 12/21 and he will complete 14 days total antibiotic therapy 12/31, given still has retained stones and to keep the urinary tract sterile - Dr. Gilford Rile to coordinate outpatient follow-up to address stone burden I will CC him on this note as it may be challenging given his limited  mobility to get him back to office   Toxic metabolic encephalopathy on admission likely secondary to hypercarbia in the setting of multiple meds Pulmonary hypertension OSA with noncompliance - Watch mentation as is on several opiates/meds which can alter the same - This is improved -resume home O@ at 2 liters as cannot use Bipap   Candidal intertrigo - Patient on nystatin powder twice daily and Interdry which we have ordered and will need bariatric bed replacement -Will need to turn frequently in bed - We did attempt to place him to rehab but he prefers to go home for several other reasons-he will need to follow-up   Chronic pain syndrome/presence of Medtronic intrathecal pump -Various meds resumed as per primary physician currently Dilaudid 4 mg every 6 as needed MS Contin 30 every 12 Cymbalta 30 twice daily Lyrica 150 3 times daily Zanaflex 4 every 8 as needed -Will need echo prior to discussion of replacement of battery for pump   Diabetes mellitus type 2 last A1c 5.3 at [place] here ranging 130s to 140s - Would discontinue coverage monitor for now   Chronic debility - Ambulatory dysfunction with inability to walk for the past 18 months as is bedbound - TOC assisting with discussion-it appears he wishes to go home and we will facilitate that he needs turning and care and we will get home health come out   Discharge Exam: Vitals:   04/04/22 0503 04/04/22 1308  BP:  137/70  Pulse: 71 78  Resp:  20  Temp:  (!) 97.3 F (36.3 C)  SpO2: 100% 98%    Subj on day of d/c   EOMI NCAT no distress no icterus no pallor Chest clear  no added sounds abdomen obese nontender no rebound Trace lower extremity edema Abdomen very obese poor exam Skin areas not reviewed today  Discharge Instructions   Discharge Instructions     Diet - low sodium heart healthy   Complete by: As directed    Discharge instructions   Complete by: As directed    Please follow  with pain MD Dress your wounds with nystatin and interdry Make sure u try to move--we will get home health to assist you. Please follow with pain mangement   Increase activity slowly   Complete by: As directed       Allergies as of 04/04/2022       Reactions   Tape Other (See Comments)   Blisters - tolerates paper tape well        Medication List     STOP taking these medications    furosemide 40 MG tablet Commonly known as: LASIX   ibuprofen 200 MG tablet Commonly known as: ADVIL   NON FORMULARY       TAKE these medications    acetaminophen 500 MG tablet Commonly known as: TYLENOL Take 1,000 mg by mouth every 6 (six) hours as needed for mild pain or moderate pain.   aspirin EC 81 MG tablet Take 81 mg by mouth daily.   calcium carbonate 600 MG Tabs tablet Commonly known as: Calcium 600 Take 1 tablet (600 mg total) by mouth 2 (two) times daily with a meal.   cephALEXin 500 MG capsule Commonly known as: KEFLEX Take 1 capsule (500 mg total) by mouth every 12 (twelve) hours for 9 days.   clotrimazole-betamethasone cream Commonly known as: LOTRISONE Apply to affected area 2 times day for one week What changed:  how much to take how to take this when to take this reasons to take this additional instructions   cyclobenzaprine 10 MG tablet Commonly known as: FLEXERIL Take 1 tablet (10 mg total) by mouth at bedtime. What changed:  when to take this reasons to take this   diphenoxylate-atropine 2.5-0.025 MG tablet Commonly known as: LOMOTIL Take 1 tablet by mouth 4 (four) times daily as needed for diarrhea or loose stools.   DULoxetine 60 MG capsule Commonly known as: CYMBALTA Take 60 mg by mouth daily.   esomeprazole 40 MG capsule Commonly known as: NEXIUM Take 1 capsule (40 mg total) by mouth daily. What changed: when to take this   HYDROmorphone 4 MG tablet Commonly known as: Dilaudid Take 1 tablet (4 mg total) by mouth every 6 (six) hours  as needed for severe pain. Must last 30 days.   InterDry 94"T654" Shee Apply 1 application  topically 2 (two) times daily.   morphine 30 MG 12 hr tablet Commonly known as: MS CONTIN Take 1 tablet (30 mg total) by mouth every 12 (twelve) hours. Must last 30 days. Do not break tablet   naloxone 4 MG/0.1ML Liqd nasal spray kit Commonly known as: NARCAN Place 1 spray into the nose as needed for up to 365 doses (for opioid-induced respiratory depresssion). In case of emergency (overdose), spray once into each nostril. If no response within 3 minutes, repeat application and call [phone].   nystatin powder Commonly known as: MYCOSTATIN/NYSTOP Apply topically 2 (two) times daily.   ondansetron 4 MG disintegrating tablet Commonly known as: ZOFRAN-ODT 76m ODT q4 hours prn nausea/vomit   polyethylene glycol powder 17 GM/SCOOP powder Commonly known as: GLYCOLAX/MIRALAX Take 17 g by mouth daily as needed for mild constipation.  pregabalin 150 MG capsule Commonly known as: LYRICA Take 150 mg by mouth 3 (three) times daily.   promethazine 25 MG tablet Commonly known as: PHENERGAN Take 25-50 mg by mouth every 6 (six) hours as needed for nausea or vomiting.   tiZANidine 4 MG tablet Commonly known as: Zanaflex Take 1 tablet (4 mg total) by mouth every 8 (eight) hours as needed for muscle spasms.   torsemide 20 MG tablet Commonly known as: DEMADEX Take 2 tablets (40 mg total) by mouth daily.   Vitamin D3 125 MCG (5000 UT) Caps Take 1 capsule (5,000 Units total) by mouth daily with breakfast. Take along with calcium and magnesium.       Allergies  Allergen Reactions   Tape Other (See Comments)    Blisters - tolerates paper tape well    Follow-up Information     Winter, Christopher Aaron, MD Follow up in 2 week(s).   Specialty: Urology Contact information: [address] [place] [address] [phone]                  The results of significant diagnostics  from this hospitalization (including imaging, microbiology, ancillary and laboratory) are listed below for reference.    Significant Diagnostic Studies: DG C-Arm 1-60 Min-No Report  Result Date: 04/01/2022 Fluoroscopy was utilized by the requesting physician.  No radiographic interpretation.   CT ABDOMEN PELVIS W CONTRAST  Result Date: 03/31/2022 CLINICAL DATA:  Abdominal pain. EXAM: CT ABDOMEN AND PELVIS WITH CONTRAST TECHNIQUE: Multidetector CT imaging of the abdomen and pelvis was performed using the standard protocol following bolus administration of intravenous contrast. RADIATION DOSE REDUCTION: This exam was performed according to the departmental dose-optimization program which includes automated exposure control, adjustment of the mA and/or kV according to patient size and/or use of iterative reconstruction technique. CONTRAST:  139m OMNIPAQUE IOHEXOL 300 MG/ML  SOLN COMPARISON:  CT abdomen and pelvis 06/10/2018 FINDINGS: Lower chest: No acute abnormality. Hepatobiliary: The liver is enlarged. There is diffuse fatty infiltration of the liver. The gallbladder bile ducts are within normal limits. Pancreas: Unremarkable. No pancreatic ductal dilatation or surrounding inflammatory changes. Spleen: Normal in size without focal abnormality. Adrenals/Urinary Tract: There is a 6 mm calculus in the distal left ureter. There is mild left-sided hydroureteronephrosis and mild left perinephric fat stranding. There are additional nonobstructing bilateral renal calculi; the largest is in the right kidney measuring 11 mm. There is no right-sided hydronephrosis. There is mild bladder wall thickening with surrounding inflammation. There is also mild inflammatory stranding surrounding the bilateral distal ureters. The adrenal glands are within normal limits. Stomach/Bowel: Stomach is within normal limits. No evidence of bowel wall thickening, distention, or inflammatory changes. There are postsurgical changes in  the stomach. Appendix is not visualized. Vascular/Lymphatic: No significant vascular findings are present. No enlarged abdominal or pelvic lymph nodes. Reproductive: Prostate is unremarkable. Other: No abdominal wall hernia or abnormality. No abdominopelvic ascites. Musculoskeletal: Postsurgical changes/disc spaces are seen at L3-L4 and L4-L5. thoracic spinal cord stimulator device again seen. IMPRESSION: 1. 6 mm calculus in the distal left ureter with mild obstructive uropathy. 2. Additional nonobstructing bilateral renal calculi. 3. Mild bladder wall thickening with surrounding inflammation concerning for cystitis. There is also mild inflammatory stranding surrounding the distal ureters bilaterally which may be related to infection. 4. Hepatomegaly and hepatic steatosis. Electronically Signed   By: ARonney AstersM.D.   On: 03/31/2022 20:59   CT Head Wo Contrast  Result Date: 03/31/2022 CLINICAL DATA:  Altered mental status.  Headache and fever. EXAM: CT HEAD WITHOUT CONTRAST TECHNIQUE: Contiguous axial images were obtained from the base of the skull through the vertex without intravenous contrast. RADIATION DOSE REDUCTION: This exam was performed according to the departmental dose-optimization program which includes automated exposure control, adjustment of the mA and/or kV according to patient size and/or use of iterative reconstruction technique. COMPARISON:  CT head dated Aug 25, 2019 FINDINGS: Brain: No evidence of acute infarction, hemorrhage, hydrocephalus, extra-axial collection or mass lesion/mass effect. Vascular: No hyperdense vessel or unexpected calcification. Skull: Normal. Negative for fracture or focal lesion. Sinuses/Orbits: No acute finding. Other: None. IMPRESSION: No acute intracranial pathology. Electronically Signed   By: Keane Police D.O.   On: 03/31/2022 20:59   DG Chest Port 1 View  Result Date: 03/31/2022 CLINICAL DATA:  Possible sepsis. EXAM: PORTABLE CHEST 1 VIEW COMPARISON:   08/24/2019 FINDINGS: Lungs are somewhat hypoinflated demonstrate no focal airspace consolidation or effusion. Mild stable cardiomegaly. Remainder of the exam is unchanged. IMPRESSION: 1. Hypoinflation without acute cardiopulmonary disease. 2. Mild stable cardiomegaly. Electronically Signed   By: Marin Olp M.D.   On: 03/31/2022 16:56    Microbiology: Recent Results (from the past 240 hour(s))  Resp panel by RT-PCR (RSV, Flu A&B, Covid) Anterior Nasal Swab     Status: None   Collection Time: 03/31/22  4:21 PM   Specimen: Anterior Nasal Swab  Result Value Ref Range Status   SARS Coronavirus 2 by RT PCR NEGATIVE NEGATIVE Final    Comment: (NOTE) SARS-CoV-2 target nucleic acids are NOT DETECTED.  The SARS-CoV-2 RNA is generally detectable in upper respiratory specimens during the acute phase of infection. The lowest concentration of SARS-CoV-2 viral copies this assay can detect is 138 copies/mL. A negative result does not preclude SARS-Cov-2 infection and should not be used as the sole basis for treatment or other patient management decisions. A negative result may occur with  improper specimen collection/handling, submission of specimen other than nasopharyngeal swab, presence of viral mutation(s) within the areas targeted by this assay, and inadequate number of viral copies(<138 copies/mL). A negative result must be combined with clinical observations, patient history, and epidemiological information. The expected result is Negative.  Fact Sheet for Patients:  EntrepreneurPulse.com.au  Fact Sheet for Healthcare Providers:  IncredibleEmployment.be  This test is no t yet approved or cleared by the [place] FDA and  has been authorized for detection and/or diagnosis of SARS-CoV-2 by FDA under an Emergency Use Authorization (EUA). This EUA will remain  in effect (meaning this test can be used) for the duration of the COVID-19 declaration under  Section 564(b)(1) of the Act, 21 U.S.C.section 360bbb-3(b)(1), unless the authorization is terminated  or revoked sooner.       Influenza A by PCR NEGATIVE NEGATIVE Final   Influenza B by PCR NEGATIVE NEGATIVE Final    Comment: (NOTE) The Xpert Xpress SARS-CoV-2/FLU/RSV plus assay is intended as an aid in the diagnosis of influenza from Nasopharyngeal swab specimens and should not be used as a sole basis for treatment. Nasal washings and aspirates are unacceptable for Xpert Xpress SARS-CoV-2/FLU/RSV testing.  Fact Sheet for Patients: EntrepreneurPulse.com.au  Fact Sheet for Healthcare Providers: IncredibleEmployment.be  This test is not yet approved or cleared by the [place] FDA and has been authorized for detection and/or diagnosis of SARS-CoV-2 by FDA under an Emergency Use Authorization (EUA). This EUA will remain in effect (meaning this test can be used) for the duration of the COVID-19 declaration  under Section 564(b)(1) of the Act, 21 U.S.C. section 360bbb-3(b)(1), unless the authorization is terminated or revoked.     Resp Syncytial Virus by PCR NEGATIVE NEGATIVE Final    Comment: (NOTE) Fact Sheet for Patients: EntrepreneurPulse.com.au  Fact Sheet for Healthcare Providers: IncredibleEmployment.be  This test is not yet approved or cleared by the [place] FDA and has been authorized for detection and/or diagnosis of SARS-CoV-2 by FDA under an Emergency Use Authorization (EUA). This EUA will remain in effect (meaning this test can be used) for the duration of the COVID-19 declaration under Section 564(b)(1) of the Act, 21 U.S.C. section 360bbb-3(b)(1), unless the authorization is terminated or revoked.  Performed at [facility], [place] [address], [place], [place] [address]   Blood Culture (routine x 2)     Status: None (Preliminary result)   Collection Time:  03/31/22  4:25 PM   Specimen: BLOOD  Result Value Ref Range Status   Specimen Description   Final    BLOOD BLOOD RIGHT FOREARM Performed at [place] [address], [place], [place] [address]    Special Requests   Final    BOTTLES DRAWN AEROBIC AND ANAEROBIC Blood Culture adequate volume Performed at [facility] [address] [place], [place] [address]    Culture   Final    NO GROWTH 4 DAYS Performed at [facility], [place] [address] [place], [place] [address]    Report Status PENDING  Incomplete  Blood Culture (routine x 2)     Status: None (Preliminary result)   Collection Time: 03/31/22  4:31 PM   Specimen: BLOOD  Result Value Ref Range Status   Specimen Description   Final    BLOOD LEFT ANTECUBITAL Performed at [facility] [address], [place], [place] [address]    Special Requests   Final    BOTTLES DRAWN AEROBIC AND ANAEROBIC Blood Culture adequate volume Performed at [facility] [address] [place], [place] [address]    Culture   Final    NO GROWTH 4 DAYS Performed at [facility], [place] [address] [place], [place] [address]    Report Status PENDING  Incomplete  Urine Culture     Status: Abnormal   Collection Time: 03/31/22 11:51 PM   Specimen: Urine, Cystoscope  Result Value Ref Range Status   Specimen Description   Final    URINE, CATHETERIZED CYTOSCOPE Performed at [facility] [address], [place], [place] [address]    Special Requests   Final    NONE Performed at [facility], [place] [address] [place], [place] [address]    Culture 10,000 COLONIES/mL PROTEUS MIRABILIS (A)  Final   Report Status 04/03/2022 FINAL  Final   Organism ID, Bacteria PROTEUS MIRABILIS (A)  Final      Susceptibility   Proteus mirabilis - MIC*    AMPICILLIN <=2 SENSITIVE Sensitive     CEFAZOLIN <=4 SENSITIVE Sensitive     CEFEPIME <=0.12  SENSITIVE Sensitive     CEFTRIAXONE <=0.25 SENSITIVE Sensitive     CIPROFLOXACIN <=0.25 SENSITIVE Sensitive     GENTAMICIN <=1 SENSITIVE Sensitive     IMIPENEM 1 SENSITIVE Sensitive     NITROFURANTOIN RESISTANT Resistant     TRIMETH/SULFA <=20 SENSITIVE Sensitive     AMPICILLIN/SULBACTAM <=2 SENSITIVE Sensitive     PIP/TAZO <=4 SENSITIVE Sensitive     * 10,000 COLONIES/mL PROTEUS MIRABILIS  Urine  Culture     Status: Abnormal   Collection Time: 04/01/22 12:19 AM   Specimen: Urine, Clean Catch  Result Value Ref Range Status   Specimen Description   Final    URINE, CLEAN CATCH Performed at [facility], [place] [address], [place], [place] [address]    Special Requests   Final    NONE Performed at [facility], [place] [address] [place], [place] [address]    Culture (A)  Final    <10,000 COLONIES/mL INSIGNIFICANT GROWTH Performed at [facility] [place] [address] [place], [place] [address]    Report Status 04/02/2022 FINAL  Final     Labs: Basic Metabolic Panel: Recent Labs  Lab 03/31/22 1631 03/31/22 1649 04/01/22 0435 04/02/22 0408  NA 135 134* 134* 135  K 3.7 3.9 3.9 3.3*  CL 98 95* 100 99  CO2 26  --  25 26  GLUCOSE 162* 165* 173* 133*  BUN _0 CREATININE 1.13 1.00 0.81 0.85  CALCIUM 8.8*  --  8.1* 8.3*  MG  --   --   --  2.1   Liver Function Tests: Recent Labs  Lab 03/31/22 1631 04/02/22 0408  AST 40 38  ALT 26 31  ALKPHOS 70 54  BILITOT 0.7 0.6  PROT 8.1 6.7  ALBUMIN 2.8* 2.3*   No results for input(s): "LIPASE", "AMYLASE" in the last 168 hours. Recent Labs  Lab 03/31/22 1641  AMMONIA 27   CBC: Recent Labs  Lab 03/31/22 1631 03/31/22 1649 04/01/22 0435 04/02/22 0408  WBC 11.5*  --  9.0 7.5  NEUTROABS 9.4*  --   --  5.1  HGB 12.7* 13.3 10.5* 11.0*  HCT 39.5 39.0 33.4* 35.7*  MCV 93.6  --  95.7 97.0  PLT 271  --  239 265   Cardiac Enzymes: No results for input(s): "CKTOTAL", "CKMB",  "CKMBINDEX", "TROPONINI" in the last 168 hours. BNP: BNP (last 3 results) No results for input(s): "BNP" in the last 8760 hours.  ProBNP (last 3 results) No results for input(s): "PROBNP" in the last 8760 hours.  CBG: Recent Labs  Lab 04/03/22 1128 04/03/22 1641 04/03/22 2149 04/04/22 0720 04/04/22 1137  GLUCAP 102* 110* 110* 97 85       Signed:  Nita Sells MD   [facility] 04/04/2022, 1:13 PM

## 2022-04-04 NOTE — Progress Notes (Signed)
Patient discharged from hospital. Transported to front entrance/exit of hospital via wheelchair by nursing staff. Upon transferring from wheelchair to private vehicle, pt "lost balance" and fell onto sidewalk. Multiple nursing staff present to assist patient into vehicle. Dr. Mahala Menghini & [facility] notified and came to assist staff and patient. Dr. Mahala Menghini assessed patient; spoke with pt's wife (via phone) and  daughter regarding availability of resources at home for pt safety.  Family stated they had adequate assistance and resources in place at home to assist with mobility and safety issues. After speaking with family, Dr.Samtani confirmed patient would be safe to proceed with discharge to home. Clebert Wenger, Yancey Flemings, RN

## 2022-04-05 LAB — CULTURE, BLOOD (ROUTINE X 2)
Culture: NO GROWTH
Culture: NO GROWTH
Special Requests: ADEQUATE
Special Requests: ADEQUATE

## 2022-04-18 ENCOUNTER — Other Ambulatory Visit (HOSPITAL_COMMUNITY): Payer: Self-pay

## 2022-04-18 ENCOUNTER — Encounter (HOSPITAL_COMMUNITY): Payer: Self-pay | Admitting: Emergency Medicine

## 2022-04-18 ENCOUNTER — Emergency Department (HOSPITAL_COMMUNITY)
Admission: EM | Admit: 2022-04-18 | Discharge: 2022-04-23 | Disposition: A | Discharge status: Home / Self Care | Payer: Medicare HMO | Attending: Emergency Medicine | Admitting: Emergency Medicine

## 2022-04-18 ENCOUNTER — Other Ambulatory Visit: Payer: Self-pay

## 2022-04-18 DIAGNOSIS — I509 Heart failure, unspecified: Secondary | ICD-10-CM | POA: Diagnosis not present

## 2022-04-18 DIAGNOSIS — Z7982 Long term (current) use of aspirin: Secondary | ICD-10-CM | POA: Diagnosis not present

## 2022-04-18 DIAGNOSIS — R531 Weakness: Secondary | ICD-10-CM | POA: Diagnosis not present

## 2022-04-18 DIAGNOSIS — I5032 Chronic diastolic (congestive) heart failure: Secondary | ICD-10-CM | POA: Diagnosis not present

## 2022-04-18 DIAGNOSIS — J449 Chronic obstructive pulmonary disease, unspecified: Secondary | ICD-10-CM | POA: Diagnosis not present

## 2022-04-18 DIAGNOSIS — R5381 Other malaise: Secondary | ICD-10-CM | POA: Diagnosis not present

## 2022-04-18 DIAGNOSIS — R000 Tachycardia, unspecified: Secondary | ICD-10-CM | POA: Insufficient documentation

## 2022-04-18 DIAGNOSIS — J9611 Chronic respiratory failure with hypoxia: Secondary | ICD-10-CM | POA: Diagnosis not present

## 2022-04-18 DIAGNOSIS — F321 Major depressive disorder, single episode, moderate: Secondary | ICD-10-CM | POA: Diagnosis not present

## 2022-04-18 DIAGNOSIS — G894 Chronic pain syndrome: Secondary | ICD-10-CM | POA: Diagnosis not present

## 2022-04-18 DIAGNOSIS — G4730 Sleep apnea, unspecified: Secondary | ICD-10-CM | POA: Diagnosis not present

## 2022-04-18 DIAGNOSIS — E7800 Pure hypercholesterolemia, unspecified: Secondary | ICD-10-CM | POA: Diagnosis not present

## 2022-04-18 DIAGNOSIS — Z8719 Personal history of other diseases of the digestive system: Secondary | ICD-10-CM | POA: Diagnosis not present

## 2022-04-18 DIAGNOSIS — R29898 Other symptoms and signs involving the musculoskeletal system: Secondary | ICD-10-CM | POA: Diagnosis not present

## 2022-04-18 DIAGNOSIS — R7303 Prediabetes: Secondary | ICD-10-CM | POA: Diagnosis not present

## 2022-04-18 LAB — URINALYSIS, ROUTINE W REFLEX MICROSCOPIC
Bacteria, UA: NONE SEEN
Bilirubin Urine: NEGATIVE
Glucose, UA: NEGATIVE mg/dL
Hgb urine dipstick: NEGATIVE
Ketones, ur: NEGATIVE mg/dL
Nitrite: NEGATIVE
Protein, ur: NEGATIVE mg/dL
Specific Gravity, Urine: 1.008 (ref 1.005–1.030)
pH: 8 (ref 5.0–8.0)

## 2022-04-18 LAB — CBC WITH DIFFERENTIAL/PLATELET
Abs Immature Granulocytes: 0.04 10*3/uL (ref 0.00–0.07)
Basophils Absolute: 0.1 10*3/uL (ref 0.0–0.1)
Basophils Relative: 1 %
Eosinophils Absolute: 0.2 10*3/uL (ref 0.0–0.5)
Eosinophils Relative: 2 %
HCT: 38 % — ABNORMAL LOW (ref 39.0–52.0)
Hemoglobin: 12.5 g/dL — ABNORMAL LOW (ref 13.0–17.0)
Immature Granulocytes: 0 %
Lymphocytes Relative: 22 %
Lymphs Abs: 2.4 10*3/uL (ref 0.7–4.0)
MCH: 31.8 pg (ref 26.0–34.0)
MCHC: 32.9 g/dL (ref 30.0–36.0)
MCV: 96.7 fL (ref 80.0–100.0)
Monocytes Absolute: 0.5 10*3/uL (ref 0.1–1.0)
Monocytes Relative: 5 %
Neutro Abs: 8 10*3/uL — ABNORMAL HIGH (ref 1.7–7.7)
Neutrophils Relative %: 70 %
Platelets: 350 10*3/uL (ref 150–400)
RBC: 3.93 MIL/uL — ABNORMAL LOW (ref 4.22–5.81)
RDW: 13.2 % (ref 11.5–15.5)
WBC: 11.2 10*3/uL — ABNORMAL HIGH (ref 4.0–10.5)
nRBC: 0 % (ref 0.0–0.2)

## 2022-04-18 LAB — COMPREHENSIVE METABOLIC PANEL
ALT: 18 U/L (ref 0–44)
AST: 28 U/L (ref 15–41)
Albumin: 2.6 g/dL — ABNORMAL LOW (ref 3.5–5.0)
Alkaline Phosphatase: 65 U/L (ref 38–126)
Anion gap: 9 (ref 5–15)
BUN: 7 mg/dL (ref 6–20)
CO2: 29 mmol/L (ref 22–32)
Calcium: 8.5 mg/dL — ABNORMAL LOW (ref 8.9–10.3)
Chloride: 100 mmol/L (ref 98–111)
Creatinine, Ser: 0.86 mg/dL (ref 0.61–1.24)
GFR, Estimated: >60 mL/min (ref >60)
Glucose, Bld: 125 mg/dL — ABNORMAL HIGH (ref 70–99)
Potassium: 4.7 mmol/L (ref 3.5–5.1)
Sodium: 138 mmol/L (ref 135–145)
Total Bilirubin: 1.2 mg/dL (ref 0.3–1.2)
Total Protein: 7.9 g/dL (ref 6.5–8.1)

## 2022-04-18 MED ORDER — PANTOPRAZOLE SODIUM 40 MG PO TBEC
40.0000 mg | DELAYED_RELEASE_TABLET | Freq: Two times a day (BID) | ORAL | Status: DC
  Administered 2022-04-19 – 2022-04-23 (×9): 40 mg via ORAL
  Filled 2022-04-18 (×9): qty 1

## 2022-04-18 MED ORDER — TIZANIDINE HCL 4 MG PO TABS
4.0000 mg | ORAL_TABLET | Freq: Three times a day (TID) | ORAL | Status: DC | PRN
  Administered 2022-04-22 (×3): 4 mg via ORAL
  Filled 2022-04-18 (×3): qty 1

## 2022-04-18 MED ORDER — ASPIRIN 81 MG PO TBEC
81.0000 mg | DELAYED_RELEASE_TABLET | Freq: Every day | ORAL | Status: DC
  Administered 2022-04-19 – 2022-04-23 (×5): 81 mg via ORAL
  Filled 2022-04-18 (×5): qty 1

## 2022-04-18 MED ORDER — LACTATED RINGERS IV BOLUS
1000.0000 mL | Freq: Once | INTRAVENOUS | Status: AC
  Administered 2022-04-18: 1000 mL via INTRAVENOUS

## 2022-04-18 MED ORDER — MORPHINE SULFATE ER 15 MG PO TBCR
30.0000 mg | EXTENDED_RELEASE_TABLET | Freq: Two times a day (BID) | ORAL | Status: DC
  Administered 2022-04-18 – 2022-04-23 (×10): 30 mg via ORAL
  Filled 2022-04-18 (×10): qty 2

## 2022-04-18 MED ORDER — DIPHENOXYLATE-ATROPINE 2.5-0.025 MG PO TABS: 1.0000 | ORAL_TABLET | Freq: Four times a day (QID) | ORAL | Status: DC | PRN

## 2022-04-18 MED ORDER — PROMETHAZINE HCL 25 MG PO TABS: 25.0000 mg | ORAL_TABLET | Freq: Four times a day (QID) | ORAL | Status: DC | PRN

## 2022-04-18 MED ORDER — DULOXETINE HCL 30 MG PO CPEP
60.0000 mg | ORAL_CAPSULE | Freq: Every day | ORAL | Status: DC
  Administered 2022-04-19 – 2022-04-23 (×5): 60 mg via ORAL
  Filled 2022-04-18 (×5): qty 2

## 2022-04-18 MED ORDER — PREGABALIN 50 MG PO CAPS
150.0000 mg | ORAL_CAPSULE | Freq: Three times a day (TID) | ORAL | Status: DC
  Administered 2022-04-19 – 2022-04-23 (×14): 150 mg via ORAL
  Filled 2022-04-18 (×14): qty 3

## 2022-04-18 MED ORDER — POLYETHYLENE GLYCOL 3350 17 G PO PACK: 17.0000 g | PACK | Freq: Every day | ORAL | Status: DC | PRN

## 2022-04-18 MED ORDER — HYDROMORPHONE HCL 2 MG PO TABS
4.0000 mg | ORAL_TABLET | Freq: Four times a day (QID) | ORAL | Status: DC | PRN
  Administered 2022-04-19 – 2022-04-22 (×5): 4 mg via ORAL
  Filled 2022-04-18 (×5): qty 2

## 2022-04-18 MED ORDER — NALOXONE HCL 4 MG/0.1ML NA LIQD: 1.0000 | NASAL | Status: DC | PRN

## 2022-04-18 MED ORDER — ONDANSETRON 4 MG PO TBDP
4.0000 mg | ORAL_TABLET | Freq: Three times a day (TID) | ORAL | Status: DC | PRN
  Administered 2022-04-20: 4 mg via ORAL
  Filled 2022-04-18: qty 1

## 2022-04-18 MED ORDER — HYDROMORPHONE HCL 4 MG PO TABS
ORAL_TABLET | ORAL | 0 refills | Status: DC
  Filled 2022-04-18: qty 12, 3d supply, fill #0

## 2022-04-18 MED ORDER — NYSTATIN 100000 UNIT/GM EX POWD
Freq: Two times a day (BID) | CUTANEOUS | Status: DC
  Filled 2022-04-18: qty 15

## 2022-04-18 MED ORDER — ACETAMINOPHEN 500 MG PO TABS
1000.0000 mg | ORAL_TABLET | Freq: Four times a day (QID) | ORAL | Status: DC | PRN
  Administered 2022-04-22 (×2): 1000 mg via ORAL
  Filled 2022-04-18 (×3): qty 2

## 2022-04-18 NOTE — Social Work (Signed)
Patient awaiting PT order TOC will follow up in the AM with the patient.

## 2022-04-18 NOTE — ED Provider Notes (Signed)
[facility]-EMERGENCY DEPT Provider Note   CSN: [id number] Arrival date & time: 04/18/22  1848     History  Chief Complaint  Patient presents with   Weakness    [name] is a 57 y.o. male.  HPI 57 year old male with multiple comorbidities which include CHF, COPD, OSA, pulmonary hypertension, chronic pain presents with generalized weakness.  He was discharged from the hospital after being treated for a septic ureteral stone on 12/22.  He is chronically in a wheelchair but normally is able to stand up and pivot.  However, since getting home he has been having more weakness than typical and is barely able to stand.  Due to this he is pretty much been in his bed the whole time.  He has been compliant with his medications including his antibiotics he was discharged on.  No fevers.  He feels lightheaded and generally weak.  His wife is in the hospital so he is currently by himself at home. He has chronic issues with diarrhea and so he is soiled upon ED/EMS arrival.  I also discussed with his wife at the bedside.  She has been admitted for a bowel obstruction and may go home tomorrow but otherwise no one is home to take care of him.  He had issues with this in the past and has been recommended to go to a facility.  She also notes that the patient's issues with standing and ambulating have not been going on since last discharge but actually for a couple months or longer.  However they did not seem to be addressed due to his septic presentation this most recent admission.  Home Medications Prior to Admission medications   Medication Sig Start Date End Date Taking? Authorizing Provider  acetaminophen (TYLENOL) 500 MG tablet Take 1,000 mg by mouth every 6 (six) hours as needed for mild pain or moderate pain.     [provider]  aspirin EC 81 MG tablet Take 81 mg by mouth daily.    [provider]  calcium carbonate (CALCIUM 600) 600 MG TABS tablet Take 1  tablet (600 mg total) by mouth 2 (two) times daily with a meal. Patient not taking: Reported on 04/01/2022 12/27/18 04/01/22  [name], MD  Cholecalciferol (VITAMIN D3) 125 MCG (5000 UT) CAPS Take 1 capsule (5,000 Units total) by mouth daily with breakfast. Take along with calcium and magnesium. Patient taking differently: Take 5,000 Units by mouth daily with breakfast. Take along with calcium and magnesium. 12/27/18 05/14/21  [name], MD  clotrimazole-betamethasone (LOTRISONE) cream Apply to affected area 2 times day for one week Patient taking differently: Apply 1 Application topically 2 (two) times daily as needed (rash). 10/31/21   Bethann Berkshire, MD  cyclobenzaprine (FLEXERIL) 10 MG tablet Take 1 tablet (10 mg total) by mouth at bedtime. Patient taking differently: Take 10 mg by mouth daily as needed for muscle spasms. 10/20/19 04/01/22  [name], MD  diphenoxylate-atropine (LOMOTIL) 2.5-0.025 MG tablet Take 1 tablet by mouth 4 (four) times daily as needed for diarrhea or loose stools. 01/13/22   [provider]  DULoxetine (CYMBALTA) 60 MG capsule Take 60 mg by mouth daily.    [provider]  esomeprazole (NEXIUM) 40 MG capsule Take 1 capsule (40 mg total) by mouth daily. Patient taking differently: Take 40 mg by mouth 2 (two) times daily before a meal. 12/27/18 04/01/22  [name], MD  HYDROmorphone (DILAUDID) 4 MG tablet Take 1 tablet (4 mg total)  by mouth every 6 (six) hours as needed for severe pain. Must last 30 days. 01/14/22 04/01/22  [name], MD  HYDROmorphone (DILAUDID) 4 MG tablet Take 1 tablet (4 mg) by mouth 4 times a day as needed 04/18/22     morphine (MS CONTIN) 30 MG 12 hr tablet Take 1 tablet (30 mg total) by mouth every 12 (twelve) hours. Must last 30 days. Do not break tablet 01/14/22 04/01/22  [name], MD  naloxone [facility]) nasal spray 4 mg/0.1 mL Place 1 spray into the nose as needed for up to 365 doses  (for opioid-induced respiratory depresssion). In case of emergency (overdose), spray once into each nostril. If no response within 3 minutes, repeat application and call 911. 11/21/21 11/21/22  [name], MD  nystatin (MYCOSTATIN/NYSTOP) powder Apply topically 2 (two) times daily. 04/04/22   Rhetta Mura, MD  ondansetron (ZOFRAN-ODT) 4 MG disintegrating tablet 4mg  ODT q4 hours prn nausea/vomit Patient not taking: Reported on 04/01/2022 10/31/21   11/02/21, MD  polyethylene glycol powder (GLYCOLAX/MIRALAX) 17 GM/SCOOP powder Take 17 g by mouth daily as needed for mild constipation. 05/26/18   [provider]  pregabalin (LYRICA) 150 MG capsule Take 150 mg by mouth 3 (three) times daily. 03/18/22 05/17/22  [provider]  promethazine (PHENERGAN) 25 MG tablet Take 25-50 mg by mouth every 6 (six) hours as needed for nausea or vomiting.  05/26/19   [provider]  Skin Protectants, Misc. (INTERDRY 07/24/19") SHEE Apply 1 application  topically 2 (two) times daily. 04/04/22   04/06/22, MD  tiZANidine (ZANAFLEX) 4 MG tablet Take 1 tablet (4 mg total) by mouth every 8 (eight) hours as needed for muscle spasms. 10/20/19 04/01/22  04/03/22, MD  torsemide (DEMADEX) 20 MG tablet Take 2 tablets (40 mg total) by mouth daily. Patient not taking: Reported on 04/01/2022 09/14/19   11/14/19, PA-C      Allergies    Tape    Review of Systems   Review of Systems  Constitutional:  Negative for fever.  Respiratory:  Negative for cough and shortness of breath.   Neurological:  Positive for weakness and light-headedness.    Physical Exam Updated Vital Signs BP 121/67   Pulse 97   Temp 98.4 F (36.9 C) (Oral)   Resp 13   SpO2 100%  Physical Exam Vitals and nursing note reviewed.  Constitutional:      Appearance: He is well-developed. He is obese.  HENT:     Head: Normocephalic and atraumatic.  Cardiovascular:     Rate and Rhythm:  Normal rate and regular rhythm.     Heart sounds: Normal heart sounds.  Pulmonary:     Effort: Pulmonary effort is normal.     Breath sounds: Normal breath sounds.  Abdominal:     Palpations: Abdomen is soft.     Tenderness: There is no abdominal tenderness.  Skin:    General: Skin is warm and dry.  Neurological:     Mental Status: He is alert.     Comments: R>L leg weakness. Is able to flex/extend at hip and knee but weaker on right     ED Results / Procedures / Treatments   Labs (all labs ordered are listed, but only abnormal results are displayed) Labs Reviewed  URINALYSIS, ROUTINE W REFLEX MICROSCOPIC - Abnormal; Notable for the following components:      Result Value   Leukocytes,Ua MODERATE (*)    All other components within normal limits  CBC WITH DIFFERENTIAL/PLATELET - Abnormal; Notable for the following components:   WBC 11.2 (*)    RBC 3.93 (*)    Hemoglobin 12.5 (*)    HCT 38.0 (*)    Neutro Abs 8.0 (*)    All other components within normal limits  COMPREHENSIVE METABOLIC PANEL - Abnormal; Notable for the following components:   Glucose, Bld 125 (*)    Calcium 8.5 (*)    Albumin 2.6 (*)    All other components within normal limits    EKG EKG Interpretation  Date/Time:  Friday April 18 2022 19:39:57 EST Ventricular Rate:  102 PR Interval:  125 QRS Duration: 100 QT Interval:  337 QTC Calculation: 439 R Axis:   44 Text Interpretation: Sinus tachycardia Left atrial enlargement Low voltage, precordial leads Abnormal R-wave progression, early transition Interpretation limited secondary to artifact Confirmed by Sherwood Gambler [phone]) on 04/18/2022 8:29:15 PM  Radiology No results found.  Procedures Procedures    Medications Ordered in ED Medications  HYDROmorphone (DILAUDID) tablet 4 mg (has no administration in time range)  morphine (MS CONTIN) 12 hr tablet 30 mg (30 mg Oral Given 04/18/22 2053)  acetaminophen (TYLENOL) tablet 1,000 mg (has no  administration in time range)  aspirin EC tablet 81 mg (has no administration in time range)  diphenoxylate-atropine (LOMOTIL) 2.5-0.025 MG per tablet 1 tablet (has no administration in time range)  DULoxetine (CYMBALTA) DR capsule 60 mg (has no administration in time range)  pantoprazole (PROTONIX) EC tablet 40 mg (has no administration in time range)  naloxone (NARCAN) nasal spray 4 mg/0.1 mL (has no administration in time range)  nystatin (MYCOSTATIN/NYSTOP) topical powder (has no administration in time range)  ondansetron (ZOFRAN-ODT) disintegrating tablet 4 mg (has no administration in time range)  polyethylene glycol powder (GLYCOLAX/MIRALAX) container 17 g (has no administration in time range)  pregabalin (LYRICA) capsule 150 mg (has no administration in time range)  promethazine (PHENERGAN) tablet 25-50 mg (has no administration in time range)  tiZANidine (ZANAFLEX) tablet 4 mg (has no administration in time range)  lactated ringers bolus 1,000 mL (0 mLs Intravenous Stopped 04/18/22 2228)    ED Course/ Medical Decision Making/ A&P                           Medical Decision Making Amount and/or Complexity of Data Reviewed External Data Reviewed: notes.    Details: WBC minimally elevated.  Creatinine is okay and no significant electrolyte disturbance.  UA has 11-20 WBCs but no bacteria so I think infection is less likely. Labs: ordered. ECG/medicine tests: independent interpretation performed.    Details: Mild sinus tachycardia but no ischemia.  Risk OTC drugs. Prescription drug management.   Patient presents primarily with generalized weakness and inability to care for self.  There is no new weakness otherwise as wife indicates this is actually been going on for months.  However the wife not available Instanyl and is able to care for the patient.  He states he would consider rehab this time as he did when he was in the hospital last time.  Ultimately I do not think is safe for him  to go home tonight so TOC will be consulted, physical therapy consult ordered and home meds ordered.  He was given a fluid bolus but otherwise besides some slight tachycardia on arrival has not had any significant vital sign abnormality.  I highly doubt he has an infection.  Final Clinical Impression(s) / ED Diagnoses Final diagnoses:  None    Rx / DC Orders ED Discharge Orders     None         Sherwood Gambler, MD 04/18/22 2341

## 2022-04-18 NOTE — ED Triage Notes (Signed)
Patient presents from home due to increased weakness and decreased ability to perform ADL's. He is normally wheelchair bound but able to stand and transfer. Since he was discharged from the hospital 4 days ago, he has just been sitting in bed unable to get up. HH came to see the patient and encouraged the patient to call EMS. His wife would normally help with care, but she is a patient in the hospital at this time. He was recently discharged after being treated for Sepsis due to kidney infection.    EMS vitals: 127/80 BP 158 CBG 104 HR 99% RA (chronic 2L)

## 2022-04-19 NOTE — ED Notes (Signed)
Patient to room 32. Patient oriented to unit and room .  Patient cooperative with care.

## 2022-04-19 NOTE — Progress Notes (Signed)
TOC CSW spoke with pts wife, Niclas Markell [phone].  Debbie stated [facility] is first choice, its in close proximity to their home.  Jeanny Rymer Tarpley-Carter, MSW, LCSW-A Pronouns:  She/Her/Hers [facility] Clinical Social Worker Direct Number:  [phone] [email or web address]

## 2022-04-19 NOTE — Evaluation (Signed)
Physical Therapy Evaluation Patient Details Name: [name] MRN: [id number] DOB: May 08, 1965 Today's Date: 04/19/2022  History of Present Illness  57 year old male with multiple comorbidities which include CHF, COPD, OSA, pulmonary hypertension, chronic pain presents with generalized weakness.  He was discharged from the hospital after being treated for a septic ureteral stone on 12/22.  He is chronically in a wheelchair but normally is able to stand up and pivot.  However, since getting home he has been having more weakness than typical and is barely able to stand.  Due to this he is pretty much been in his bed the whole time.  He has been compliant with his medications including his antibiotics he was discharged on.  No fevers.  He feels lightheaded and generally weak. His wife is currently in the hospital herself.  Clinical Impression  Pt admitted with above diagnosis.  Pt currently with functional limitations due to the deficits listed below (see PT Problem List). Pt will benefit from skilled PT to increase their independence and safety with mobility to allow discharge to the venue listed below.  Pt is normally independent in transfers to w/c, but has had a significant decline in status with inability to get OOB and increase in pain. Recommend short term SNF rehab, which he is in agreement with.        Recommendations for follow up therapy are one component of a multi-disciplinary discharge planning process, led by the attending physician.  Recommendations may be updated based on patient status, additional functional criteria and insurance authorization.  Follow Up Recommendations Skilled nursing-short term rehab (<3 hours/day) Can patient physically be transported by private vehicle: No    Assistance Recommended at Discharge Intermittent Supervision/Assistance  Patient can return home with the following  A lot of help with walking and/or transfers;A lot of help with  bathing/dressing/bathroom    Equipment Recommendations None recommended by PT  Recommendations for Other Services       Functional Status Assessment Patient has had a recent decline in their functional status and demonstrates the ability to make significant improvements in function in a reasonable and predictable amount of time.     Precautions / Restrictions Precautions Precautions: Fall Precaution Comments: History of Falls Restrictions Weight Bearing Restrictions: No      Mobility  Bed Mobility   Bed Mobility: Rolling Rolling: Supervision, Total assist         General bed mobility comments: Rolling R with rail and S, rolling L  TOT/unable due to pain. Declined sitting EOB due to fear of pain.    Transfers                   General transfer comment: Unable to tolerate due to pain.    Ambulation/Gait                  Stairs            Wheelchair Mobility    Modified Rankin (Stroke Patients Only)       Balance Overall balance assessment: History of Falls                                           Pertinent Vitals/Pain Pain Assessment Pain Assessment: 0-10 Faces Pain Scale: Hurts whole lot Pain Location: low back into R buttocks and thigh Pain Descriptors / Indicators: Grimacing, Guarding, Other (Comment) (grabbing) Pain Intervention(s):  Monitored during session, Repositioned    Home Living Family/patient expects to be discharged to:: Skilled nursing facility                   Additional Comments: scooter tilts, adjustable bed with no rails. Pt lives with spouse who has h/o health issues and is limited in ability to assist and is currently in the hopsital herself.  Their adult son who has CP lives with them, pt's adult daughter (in college) is helping with pt's son's care (cognitive>physical).    Prior Function                       Hand Dominance        Extremity/Trunk Assessment   Upper  Extremity Assessment Upper Extremity Assessment: Overall WFL for tasks assessed    Lower Extremity Assessment RLE Deficits / Details: Position of comfort is LE flexed, but can extend with L LE less pain in full extension than R.    Cervical / Trunk Assessment Cervical / Trunk Exceptions: morbid obesity  Communication      Cognition Arousal/Alertness: Awake/alert Behavior During Therapy: WFL for tasks assessed/performed Overall Cognitive Status: Within Functional Limits for tasks assessed                                 General Comments: Patient is pleasant and agreeable.        General Comments      Exercises     Assessment/Plan    PT Assessment Patient needs continued PT services  PT Problem List Decreased strength;Decreased range of motion;Decreased activity tolerance;Decreased balance;Decreased mobility;Pain       PT Treatment Interventions DME instruction;Functional mobility training;Therapeutic activities;Therapeutic exercise;Balance training;Patient/family education    PT Goals (Current goals can be found in the Care Plan section)  Acute Rehab PT Goals Patient Stated Goal: To go to rehab and get stronger so he can be independent with his transfers again. PT Goal Formulation: With patient Time For Goal Achievement: 05/03/22 Potential to Achieve Goals: Good    Frequency Min 2X/week     Co-evaluation               AM-PAC PT "6 Clicks" Mobility  Outcome Measure Help needed turning from your back to your side while in a flat bed without using bedrails?: A Lot Help needed moving from lying on your back to sitting on the side of a flat bed without using bedrails?: A Lot Help needed moving to and from a bed to a chair (including a wheelchair)?: Total Help needed standing up from a chair using your arms (e.g., wheelchair or bedside chair)?: Total Help needed to walk in hospital room?: Total Help needed climbing 3-5 steps with a railing? :  Total 6 Click Score: 8    End of Session Equipment Utilized During Treatment: Oxygen Activity Tolerance: Patient limited by pain Patient left: in bed;with call bell/phone within reach   PT Visit Diagnosis: Other abnormalities of gait and mobility (R26.89);Muscle weakness (generalized) (M62.81)    Time: 6144-3154 PT Time Calculation (min) (ACUTE ONLY): 10 min   Charges:   PT Evaluation $PT Eval Low Complexity: 1 Low          Colin Broach., PT Office [phone] Acute Rehab 04/19/2022   Enzo Montgomery 04/19/2022, 10:12 AM

## 2022-04-19 NOTE — [place] FL2 (Signed)
[place] MEDICAID FL2 LEVEL OF CARE FORM     IDENTIFICATION  Patient Name: [name] Birthdate: [birth year] Sex: male Admission Date (Current Location): 04/18/2022  [facility] and [place] Number:  Producer, television/film/video and Address:  [facility],  [address] [place], [place] [address]      Provider Number: [id number]  Attending Physician Name and Address:  Default, Provider, MD  Relative Name and Phone Number:  Ben Habermann [phone]    Current Level of Care: Hospital Recommended Level of Care: Skilled Nursing Facility Prior Approval Number:    Date Approved/Denied:   PASRR Number: [id number]  Discharge Plan: SNF    Current Diagnoses: Patient Active Problem List   Diagnosis Date Noted   Acute metabolic encephalopathy 04/01/2022   Complicated UTI (urinary tract infection) 04/01/2022   Sepsis secondary urosepsis/stone 03/31/2022   Candidal intertrigo 03/31/2022   Opioid use disorder 01/06/2022   Genetic testing (CPY450 2D6: Normal activity) 11/21/2021   Abnormal MRI, lumbar spine (11/21/2021) 11/21/2021   Displacement of spinal infusion catheter 09/19/2021   Malfunction of intrathecal infusion pump (HCC) 09/19/2021   End of battery life of intrathecal infusion pump 09/19/2021   Irritable bowel syndrome 07/16/2021   Mild persistent asthma 07/16/2021   Moderate major depression, single episode (HCC) 07/16/2021   Pure hypercholesterolemia 07/16/2021   Urinary incontinence 07/16/2021   Chronic respiratory failure (HCC) 07/16/2021   Constipation 07/16/2021   Sleep apnea 07/16/2021   Type 2 diabetes mellitus with other specified complication (HCC) 07/16/2021   Epidural fibrosis 11/22/2020   Weakness of lower extremities (Bilateral) 11/22/2020   Weakness of back 11/22/2020   Lumbosacral spondylosis with radiculopathy 11/22/2020   Physical deconditioning 11/22/2020   Type 2 diabetes mellitus with diabetic polyneuropathy, without long-term current use of  insulin (HCC) 11/22/2020   Type 2 diabetes mellitus with morbid obesity (HCC) 11/22/2020   Physical tolerance to opiate drug 11/22/2020   Unable to ambulate 11/22/2020   Chronic use of opiate for therapeutic purpose 07/19/2020   Uncomplicated opioid dependence (HCC) 03/15/2020   Restrictive airway disease 01/24/2020   Elevated blood-pressure reading, without diagnosis of hypertension 04/19/2019   Preoperative testing 09/13/2018   Altered mental status 07/11/2018   Fever    Myoclonic jerking    Obesity, morbid (more than 100 lbs over ideal weight or BMI > 40) (HCC) 06/30/2018   DDD (degenerative disc disease), thoracic 06/30/2018   Osteoarthritis of lumbar spine 06/30/2018   History of acute alcoholic pancreatitis 06/30/2018   Male hypogonadism 06/30/2018   Vitamin D deficiency 06/30/2018   Chronic hyperglycemia 06/30/2018   Alcoholism (HCC) 06/18/2018   Alcohol-induced acute pancreatitis without infection or necrosis 06/18/2018   Acute alcoholic pancreatitis 06/11/2018   Hypokalemia 06/11/2018   Hyponatremia 06/11/2018   Chronic diastolic heart failure (HCC) 06/11/2018   Nausea and vomiting 06/10/2018   Abdominal pain 06/10/2018   Encounter for screening colonoscopy 06/10/2018   Morbid obesity with BMI of 45.0-49.9, adult (HCC) 05/25/2018   Secondary osteoarthritis of multiple sites 05/25/2018   Osteoarthritis of hip (Right) 05/25/2018   Encounter for adjustment or management of infusion pump 04/01/2018   Lumbar spondylosis 01/26/2018   Spondylosis without myelopathy or radiculopathy, lumbosacral region 12/02/2017   Lumbar facet syndrome (Bilateral) (R>L) 12/02/2017   Chronic low back pain (1ry area of Pain) (Bilateral) (R>L) w/o sciatica 12/02/2017   Chronic knee pain after total replacement (Left) 12/01/2017   Chronic musculoskeletal pain 11/17/2017   Muscle spasticity 11/17/2017   Gastroesophageal reflux disease  11/17/2017   Neurogenic pain 11/17/2017   Drug-induced nausea  and vomiting 11/17/2017   Therapeutic opioid-induced constipation (OIC) 11/17/2017   Chronic pain syndrome 11/09/2017   Long term prescription benzodiazepine use 11/09/2017   Long term prescription opiate use 11/09/2017   Opiate use (140 MME/day) (oral) 11/09/2017   Long-term use of high-risk medication 11/09/2017   Pharmacologic therapy 11/09/2017   Disorder of skeletal system 11/09/2017   Problems influencing health status 11/09/2017   Chronic nausea s/p bariatric surgery 11/09/2017   Presence of intrathecal pump ([employer]) 11/09/2017   Encounter for interrogation of infusion pump 11/09/2017   Chronic foot pain (Right) 11/09/2017   Chronic hip pain (Right) 11/09/2017   Old tear of medial meniscus of knee (Left) 11/09/2017   Failed back surgical syndrome 11/09/2017   Chronic low back pain (Bilateral) w/ sciatica (Bilateral) 11/09/2017   Chronic lower extremity pain (2ry area of Pain) (Bilateral) (L>R) 11/09/2017   Chronic lower extremity radicular pain 11/09/2017   Chronic shoulder pain (Bilateral) 11/09/2017   History of total knee replacement (Left) 11/09/2017   History of arthroscopic surgery of shoulder (Bilateral) 11/09/2017   History of bariatric surgery (September 2016) 01/08/2015   OSA complicated by mild polycythemia  10/21/2014   Polycythemia, secondary 10/21/2014   Dyspnea 10/20/2014   S/P rotator cuff repair (Bilateral) 08/18/2013   DDD (degenerative disc disease), lumbosacral 10/20/2006    Orientation RESPIRATION BLADDER Height & Weight     Self, Time, Place, Situation  Normal Continent Weight:   Height:     BEHAVIORAL SYMPTOMS/MOOD NEUROLOGICAL BOWEL NUTRITION STATUS      Continent Diet (Regular)  AMBULATORY STATUS COMMUNICATION OF NEEDS Skin   Extensive Assist Verbally Normal                       Personal Care Assistance Level of Assistance  Bathing, Feeding, Dressing Bathing Assistance: Limited assistance Feeding assistance: Limited  assistance Dressing Assistance: Limited assistance     Functional Limitations Info  Sight, Hearing, Speech Sight Info: Adequate Hearing Info: Adequate Speech Info: Adequate    SPECIAL CARE FACTORS FREQUENCY  PT (By licensed PT), OT (By licensed OT)     PT Frequency: 5 times a week OT Frequency: 5 times a week            Contractures Contractures Info: Not present    Additional Factors Info  Code Status, Allergies Code Status Info: Full Code Allergies Info: Tape           Current Medications (04/19/2022):  This is the current hospital active medication list Current Facility-Administered Medications  Medication Dose Route Frequency Provider Last Rate Last Admin   acetaminophen (TYLENOL) tablet 1,000 mg  1,000 mg Oral Q6H PRN Pricilla Loveless, MD       aspirin EC tablet 81 mg  81 mg Oral Daily Pricilla Loveless, MD   81 mg at 04/19/22 0935   diphenoxylate-atropine (LOMOTIL) 2.5-0.025 MG per tablet 1 tablet  1 tablet Oral QID PRN Pricilla Loveless, MD       DULoxetine (CYMBALTA) DR capsule 60 mg  60 mg Oral Daily Pricilla Loveless, MD   60 mg at 04/19/22 1049   HYDROmorphone (DILAUDID) tablet 4 mg  4 mg Oral Q6H PRN Pricilla Loveless, MD       morphine (MS CONTIN) 12 hr tablet 30 mg  30 mg Oral Q12H Pricilla Loveless, MD   30 mg at 04/19/22 0935   nystatin (MYCOSTATIN/NYSTOP) topical powder   Topical BID  Pricilla Loveless, MD   Given at 04/19/22 0934   ondansetron (ZOFRAN-ODT) disintegrating tablet 4 mg  4 mg Oral Q8H PRN Pricilla Loveless, MD       pantoprazole (PROTONIX) EC tablet 40 mg  40 mg Oral BID AC Pricilla Loveless, MD   40 mg at 04/19/22 0935   polyethylene glycol (MIRALAX / GLYCOLAX) packet 17 g  17 g Oral Daily PRN Pricilla Loveless, MD       pregabalin (LYRICA) capsule 150 mg  150 mg Oral TID Pricilla Loveless, MD   150 mg at 04/19/22 1049   promethazine (PHENERGAN) tablet 25-50 mg  25-50 mg Oral Q6H PRN Pricilla Loveless, MD       tiZANidine (ZANAFLEX) tablet 4 mg  4 mg Oral Q8H PRN  Pricilla Loveless, MD       Current Outpatient Medications  Medication Sig Dispense Refill   acetaminophen (TYLENOL) 500 MG tablet Take 1,000 mg by mouth every 6 (six) hours as needed for mild pain or moderate pain.      aspirin EC 81 MG tablet Take 81 mg by mouth daily.     cephALEXin (KEFLEX) 500 MG capsule Take 500 mg by mouth every 12 (twelve) hours.     clotrimazole-betamethasone (LOTRISONE) cream Apply to affected area 2 times day for one week (Patient taking differently: Apply 1 Application topically 2 (two) times daily as needed (rash).) 45 g 0   furosemide (LASIX) 40 MG tablet Take 40 mg by mouth daily as needed for fluid or edema.     HYDROmorphone (DILAUDID) 4 MG tablet Take 1 tablet (4 mg) by mouth 4 times a day as needed (Patient taking differently: Take 4 mg by mouth every 6 (six) hours as needed for moderate pain or severe pain.) 12 tablet 0   morphine (MS CONTIN) 30 MG 12 hr tablet Take 30 mg by mouth every 12 (twelve) hours.     naloxone (NARCAN) nasal spray 4 mg/0.1 mL Place 1 spray into the nose as needed for up to 365 doses (for opioid-induced respiratory depresssion). In case of emergency (overdose), spray once into each nostril. If no response within 3 minutes, repeat application and call 911. 1 each 0   nystatin (MYCOSTATIN/NYSTOP) powder Apply topically 2 (two) times daily. 15 g 0   pregabalin (LYRICA) 150 MG capsule Take 150 mg by mouth 3 (three) times daily.     promethazine (PHENERGAN) 25 MG tablet Take 25-50 mg by mouth every 6 (six) hours as needed for nausea or vomiting.      Skin Protectants, Misc. (INTERDRY 27"O536") SHEE Apply 1 application  topically 2 (two) times daily. 60 each 0   tiZANidine (ZANAFLEX) 4 MG tablet Take 4 mg by mouth every 8 (eight) hours as needed for muscle spasms.     calcium carbonate (CALCIUM 600) 600 MG TABS tablet Take 1 tablet (600 mg total) by mouth 2 (two) times daily with a meal. (Patient not taking: Reported on 04/01/2022) 180 tablet 3    Cholecalciferol (VITAMIN D3) 125 MCG (5000 UT) CAPS Take 1 capsule (5,000 Units total) by mouth daily with breakfast. Take along with calcium and magnesium. (Patient taking differently: Take 5,000 Units by mouth daily with breakfast. Take along with calcium and magnesium.) 90 capsule 3   cyclobenzaprine (FLEXERIL) 10 MG tablet Take 1 tablet (10 mg total) by mouth at bedtime. (Patient taking differently: Take 10 mg by mouth daily as needed for muscle spasms.) 90 tablet 1   esomeprazole (NEXIUM) 40 MG capsule  Take 1 capsule (40 mg total) by mouth daily. (Patient taking differently: Take 40 mg by mouth 2 (two) times daily before a meal.) 90 capsule 3   HYDROmorphone (DILAUDID) 4 MG tablet Take 1 tablet (4 mg total) by mouth every 6 (six) hours as needed for severe pain. Must last 30 days. 120 tablet 0   morphine (MS CONTIN) 30 MG 12 hr tablet Take 1 tablet (30 mg total) by mouth every 12 (twelve) hours. Must last 30 days. Do not break tablet 60 tablet 0   ondansetron (ZOFRAN-ODT) 4 MG disintegrating tablet 4mg  ODT q4 hours prn nausea/vomit (Patient not taking: Reported on 04/01/2022) 20 tablet 1   tiZANidine (ZANAFLEX) 4 MG tablet Take 1 tablet (4 mg total) by mouth every 8 (eight) hours as needed for muscle spasms. 90 tablet 5   torsemide (DEMADEX) 20 MG tablet Take 2 tablets (40 mg total) by mouth daily. (Patient not taking: Reported on 04/01/2022) 180 tablet 3     Discharge Medications: Please see discharge summary for a list of discharge medications.  Relevant Imaging Results:  Relevant Lab Results:   Additional Information SSN:  [id number]           HT:  5\' 11"          WT:  355 lbs  Kaliyan Osbourn C Tarpley-Carter, LCSWA

## 2022-04-19 NOTE — ED Notes (Signed)
Pt awake with no complaints at this.

## 2022-04-20 NOTE — Progress Notes (Signed)
TOC CSW received a call from [place], pts wife.  Debbie stated if [name] does not accept pt tomorrow they will accept [facility] bed offer.  But continue to inquire to [name].  Also, keep Debbie updated on [name]'s decision.  Alexandr Yaworski Tarpley-Carter, MSW, LCSW-A Pronouns:  She/Her/Hers [facility] Clinical Social Worker Direct Number:  [phone] [email or web address]

## 2022-04-20 NOTE — ED Provider Notes (Signed)
Emergency Medicine Observation Re-evaluation Note  [name] is a 57 y.o. male, seen on rounds today.  Pt initially presented to the ED for complaints of Weakness He had been discharged from the hospital after having a complicated urinary tract infection with 6 mm stone, toxic metabolic encephalopathy, and after returning home family was having a difficult time caring for him and brought him here with desire for skilled nursing placement.  IS sleeping at time of my evaluation. No concerns per nursing staff.  Physical Exam  BP (!) 115/52   Pulse 100   Temp 98.2 F (36.8 C) (Oral)   Resp 18   SpO2 100%  Physical Exam General: NAD Cardiac: RR Lungs: even,unlaored, on home 2L O2 Psych: n/a  ED Course / MDM  EKG:EKG Interpretation  Date/Time:  Friday April 18 2022 19:39:57 EST Ventricular Rate:  102 PR Interval:  125 QRS Duration: 100 QT Interval:  337 QTC Calculation: 439 R Axis:   44 Text Interpretation: Sinus tachycardia Left atrial enlargement Low voltage, precordial leads Abnormal R-wave progression, early transition Interpretation limited secondary to artifact Confirmed by Pricilla Loveless [phone]) on 04/18/2022 8:29:15 PM  I have reviewed the labs performed to date as well as medications administered while in observation.  Recent changes in the last 24 hours include none.  Plan  Current plan is for placement into SNF.    Alvira Monday, MD 04/21/22 1041

## 2022-04-20 NOTE — Progress Notes (Signed)
TOC CSW attempted to contact Elyse Hsu @ [name].  CSW left HIPPA compliant message with both Corey Skains and my contact information.  Jamia Hoban Tarpley-Carter, MSW, LCSW-A Pronouns:  She/Her/Hers [facility] Clinical Social Worker Direct Number:  [phone] [email or web address]

## 2022-04-20 NOTE — Progress Notes (Signed)
TOC CSW spoke with pts wife, Jackelyn Poling.  Debbie declined [facility], and will look at [facility].  Debbie asked that someone contact admissions at [facility] on Monday (04/21/22) to inquire about acceptance.  Deola Rewis Tarpley-Carter, MSW, LCSW-A Pronouns:  She/Her/Hers [facility] Clinical Social Worker Direct Number:  [phone] [email or web address]

## 2022-04-21 NOTE — ED Provider Notes (Signed)
Emergency Medicine Observation Re-evaluation Note  [name] is a 57 y.o. male, seen on rounds today.  Pt initially presented to the ED for complaints of Weakness Currently, the patient is resting comfortably.  Physical Exam  BP 117/64   Pulse 90   Temp 98.3 F (36.8 C)   Resp 20   SpO2 100%  Physical Exam General: NAD  ED Course / MDM  EKG:EKG Interpretation  Date/Time:  Friday April 18 2022 19:39:57 EST Ventricular Rate:  102 PR Interval:  125 QRS Duration: 100 QT Interval:  337 QTC Calculation: 439 R Axis:   44 Text Interpretation: Sinus tachycardia Left atrial enlargement Low voltage, precordial leads Abnormal R-wave progression, early transition Interpretation limited secondary to artifact Confirmed by Sherwood Gambler [phone]) on 04/18/2022 8:29:15 PM  I have reviewed the labs performed to date as well as medications administered while in observation.  Recent changes in the last 24 hours include no acute events reported.  Plan  Current plan is for placement - [name] vs [facility]?Marland Kitchen    Valarie Merino, MD 04/21/22 [phone]

## 2022-04-21 NOTE — Progress Notes (Signed)
Auth submitted. Pending. 

## 2022-04-21 NOTE — ED Notes (Signed)
Patient alert this shift. Patient cooperative. Patient medication compliant . Patient needs assist with ADLs.

## 2022-04-21 NOTE — Progress Notes (Signed)
8:23 AUTH still pending TOC will continue to follow.

## 2022-04-21 NOTE — Progress Notes (Signed)
AUTH still pending as of 6:48 pm

## 2022-04-21 NOTE — Progress Notes (Addendum)
This CSW contacted [name], pt's wife, to inform that [name] is not in network with [name] per [place], admissions rep. [name] states his rehab stay will be under workers comp. This CSW inquired about workers comp company and Science writer. [name] provided [name] as the caseworker and the company is [employer] [phone]), claim ID [id number]. This CSW left [name] a Set designer. This CSW called [name] and spoke with Admissions rep, [name] to inquire about whether they accept pt's under worker's comp. [name] reported they never have and stated they could not extend a bed offer. This CSW called [name] to inform and she requested contact information for [name]. TOC following for [name]'s call back.   Addend @ 10:44 AM [name], the pt's wife, requested that TOC move forward with [name]. Thsi CSW informed [name] that the pt would be coming under his worker's comp and [name] informed she needed to check with her Scientist, physiological. She is still awaiting a response. This CSW left another Set designer for [name].   Addend @ 12:24 PM This CSW left another HIPAA Compliant voicemail.   Addend @ 2:16 PM Pt's wife called and requests Auth be submitted through [facility]. TOC will submit auth.

## 2022-04-22 NOTE — ED Notes (Signed)
Patient alert oriented and medicated.

## 2022-04-22 NOTE — Progress Notes (Signed)
Josem Kaufmann has been approved. Pt can d/c to SNF on 1/10 at 10 AM.

## 2022-04-22 NOTE — Progress Notes (Signed)
Physical Therapy Treatment Patient Details Name: [name] MRN: [id number] DOB: [birth year] Today's Date: 04/22/2022   History of Present Illness 57 year old male with multiple comorbidities which include CHF, COPD, OSA, pulmonary hypertension, chronic pain presents with generalized weakness.  He was discharged from the hospital after being treated for a septic ureteral stone on 12/22.  He is chronically in a wheelchair but normally is able to stand up and pivot.  However, since getting home he has been having more weakness than typical and is barely able to stand.  Due to this he is pretty much been in his bed the whole time.  He has been compliant with his medications including his antibiotics he was discharged on.  No fevers.  He feels lightheaded and generally weak. His wife is currently in the hospital herself.    PT Comments    Pt pleasant, conversational, declines sitting EOB or transfers due to wanting to save energy for getting dressed and transferring to SNF facility today. Pt engages in supine BLE exercises, cues to avoid breath holding with exercises to decrease abdominal pressure and low back pain. Pt able to perform isometric abdominal exercises with cues and time, again using breathwork to decrease back pain. Pt with 7/10 pain that didn't increase with exercises.   Recommendations for follow up therapy are one component of a multi-disciplinary discharge planning process, led by the attending physician.  Recommendations may be updated based on patient status, additional functional criteria and insurance authorization.  Follow Up Recommendations  Skilled nursing-short term rehab (<3 hours/day) Can patient physically be transported by private vehicle: No   Assistance Recommended at Discharge Intermittent Supervision/Assistance  Patient can return home with the following A lot of help with walking and/or transfers;A lot of help with bathing/dressing/bathroom   Equipment  Recommendations  None recommended by PT    Recommendations for Other Services       Precautions / Restrictions Precautions Precautions: Fall Precaution Comments: History of Falls Restrictions Weight Bearing Restrictions: No     Mobility  Bed Mobility  General bed mobility comments: pt declines due to high pain; states he thinks he is d/c today and wants to save energy for getting dressed and OOB for transfer to facility    Transfers  General transfer comment: pt declines due to high pain; states he thinks he is d/c today and wants to save energy for getting dressed and OOB for transfer to facility    Ambulation/Gait    Stairs             Wheelchair Mobility    Modified Rankin (Stroke Patients Only)       Balance       Cognition Arousal/Alertness: Awake/alert Behavior During Therapy: WFL for tasks assessed/performed Overall Cognitive Status: Within Functional Limits for tasks assessed  General Comments: pt pleasant, conversational        Exercises General Exercises - Lower Extremity Ankle Circles/Pumps: Supine, AROM, Both, 10 reps Quad Sets: Supine, Strengthening, Both, 10 reps Gluteal Sets: Supine, Strengthening, 5 reps Short Arc Quad: Supine, Strengthening, Both, 10 reps Other Exercises Other Exercises: isometric abdominal activation with exhale, avoiding breath holding, 3 sec isometric hold, 5 reps    General Comments        Pertinent Vitals/Pain Pain Assessment Pain Assessment: 0-10 Pain Score: 7  Pain Location: "low back along my spine" Pain Descriptors / Indicators: Discomfort, Guarding, Grimacing Pain Intervention(s): Limited activity within patient's tolerance, Monitored during session, Repositioned    Home Living  Prior Function            PT Goals (current goals can now be found in the care plan section) Acute Rehab PT Goals Patient Stated Goal: To go to rehab and get stronger so he can be  independent with his transfers again. PT Goal Formulation: With patient Time For Goal Achievement: 05/03/22 Potential to Achieve Goals: Good Progress towards PT goals: Progressing toward goals    Frequency    Min 2X/week      PT Plan Current plan remains appropriate    Co-evaluation              AM-PAC PT "6 Clicks" Mobility   Outcome Measure  Help needed turning from your back to your side while in a flat bed without using bedrails?: A Lot Help needed moving from lying on your back to sitting on the side of a flat bed without using bedrails?: A Lot Help needed moving to and from a bed to a chair (including a wheelchair)?: Total Help needed standing up from a chair using your arms (e.g., wheelchair or bedside chair)?: Total Help needed to walk in hospital room?: Total Help needed climbing 3-5 steps with a railing? : Total 6 Click Score: 8    End of Session Equipment Utilized During Treatment: Oxygen Activity Tolerance: Patient limited by pain Patient left: in bed;with call bell/phone within reach Nurse Communication: Mobility status PT Visit Diagnosis: Other abnormalities of gait and mobility (R26.89);Muscle weakness (generalized) (M62.81)     Time: 0623-7628 PT Time Calculation (min) (ACUTE ONLY): 15 min  Charges:  $Therapeutic Exercise: 8-22 mins                      Tori Ashaunti Treptow PT, DPT 04/22/22, 11:15 AM

## 2022-04-22 NOTE — ED Notes (Signed)
Pt's breakfast has arrived, sitting on his bedside table.

## 2022-04-22 NOTE — Progress Notes (Addendum)
Pt's Auth is still pending.   Addend @ 1:09 PM

## 2022-04-22 NOTE — ED Provider Notes (Signed)
Emergency Medicine Observation Re-evaluation Note  DIOR STEPTER is a 57 y.o. male, seen on rounds today.  Pt initially presented to the ED for complaints of Weakness Currently, the patient is resting comfortably.  Physical Exam  BP (!) 113/51 (BP Location: Left Arm)   Pulse 86   Temp 98.6 F (37 C) (Oral)   Resp 18   SpO2 100%  Physical Exam General: NAD  ED Course / MDM  EKG:EKG Interpretation  Date/Time:  Friday April 18 2022 19:39:57 EST Ventricular Rate:  102 PR Interval:  125 QRS Duration: 100 QT Interval:  337 QTC Calculation: 439 R Axis:   44 Text Interpretation: Sinus tachycardia Left atrial enlargement Low voltage, precordial leads Abnormal R-wave progression, early transition Interpretation limited secondary to artifact Confirmed by Sherwood Gambler [phone]) on 04/18/2022 8:29:15 PM  I have reviewed the labs performed to date as well as medications administered while in observation.  Recent changes in the last 24 hours include no acute events reported.  Plan  Current plan is for placement.       Regan Lemming, MD 04/22/22 1000

## 2022-04-22 NOTE — Progress Notes (Addendum)
Patient was approved, the patient will be able to DC tomorrow at 10am. The report number is [phone] RM number [place].    This CSW has informed the wife.

## 2022-04-23 DIAGNOSIS — M545 Low back pain, unspecified: Secondary | ICD-10-CM | POA: Diagnosis not present

## 2022-04-23 DIAGNOSIS — J9611 Chronic respiratory failure with hypoxia: Secondary | ICD-10-CM | POA: Diagnosis not present

## 2022-04-23 DIAGNOSIS — G8929 Other chronic pain: Secondary | ICD-10-CM | POA: Diagnosis not present

## 2022-04-23 DIAGNOSIS — R000 Tachycardia, unspecified: Secondary | ICD-10-CM | POA: Diagnosis not present

## 2022-04-23 DIAGNOSIS — I509 Heart failure, unspecified: Secondary | ICD-10-CM | POA: Diagnosis not present

## 2022-04-23 DIAGNOSIS — Z7401 Bed confinement status: Secondary | ICD-10-CM | POA: Diagnosis not present

## 2022-04-23 DIAGNOSIS — K580 Irritable bowel syndrome with diarrhea: Secondary | ICD-10-CM | POA: Diagnosis not present

## 2022-04-23 DIAGNOSIS — E119 Type 2 diabetes mellitus without complications: Secondary | ICD-10-CM | POA: Diagnosis not present

## 2022-04-23 DIAGNOSIS — J449 Chronic obstructive pulmonary disease, unspecified: Secondary | ICD-10-CM | POA: Diagnosis not present

## 2022-04-23 DIAGNOSIS — R531 Weakness: Secondary | ICD-10-CM | POA: Diagnosis not present

## 2022-04-23 DIAGNOSIS — B372 Candidiasis of skin and nail: Secondary | ICD-10-CM | POA: Diagnosis not present

## 2022-04-23 DIAGNOSIS — E1169 Type 2 diabetes mellitus with other specified complication: Secondary | ICD-10-CM | POA: Diagnosis not present

## 2022-04-23 DIAGNOSIS — E7800 Pure hypercholesterolemia, unspecified: Secondary | ICD-10-CM | POA: Diagnosis not present

## 2022-04-23 DIAGNOSIS — K0889 Other specified disorders of teeth and supporting structures: Secondary | ICD-10-CM | POA: Diagnosis not present

## 2022-04-23 DIAGNOSIS — G894 Chronic pain syndrome: Secondary | ICD-10-CM | POA: Diagnosis not present

## 2022-04-23 DIAGNOSIS — M6281 Muscle weakness (generalized): Secondary | ICD-10-CM | POA: Diagnosis not present

## 2022-04-23 DIAGNOSIS — Z7982 Long term (current) use of aspirin: Secondary | ICD-10-CM | POA: Diagnosis not present

## 2022-04-23 DIAGNOSIS — I5032 Chronic diastolic (congestive) heart failure: Secondary | ICD-10-CM | POA: Diagnosis not present

## 2022-04-23 DIAGNOSIS — A419 Sepsis, unspecified organism: Secondary | ICD-10-CM | POA: Diagnosis not present

## 2022-04-23 DIAGNOSIS — N390 Urinary tract infection, site not specified: Secondary | ICD-10-CM | POA: Diagnosis not present

## 2022-04-23 DIAGNOSIS — N179 Acute kidney failure, unspecified: Secondary | ICD-10-CM | POA: Diagnosis not present

## 2022-04-23 DIAGNOSIS — M4727 Other spondylosis with radiculopathy, lumbosacral region: Secondary | ICD-10-CM | POA: Diagnosis not present

## 2022-04-23 DIAGNOSIS — I270 Primary pulmonary hypertension: Secondary | ICD-10-CM | POA: Diagnosis not present

## 2022-04-23 DIAGNOSIS — D649 Anemia, unspecified: Secondary | ICD-10-CM | POA: Diagnosis not present

## 2022-04-23 DIAGNOSIS — R55 Syncope and collapse: Secondary | ICD-10-CM | POA: Diagnosis not present

## 2022-04-23 NOTE — ED Notes (Signed)
Patient discharged of unit to facility per provider. Patient alert, calm, cooperative, no s/s of distress. Discharge information and belongings given to [facility] for transport. Patient off unit on stretcher, escorted and transported by [place].

## 2022-04-23 NOTE — Progress Notes (Addendum)
This CSW notified EDP and RN that pt is d/c to [name]. RN to call PTAR. Wife notified. TOC signing off.

## 2022-04-23 NOTE — ED Provider Notes (Signed)
Emergency Medicine Observation Re-evaluation Note  [name] is a 57 y.o. male, seen on rounds today.  Pt initially presented to the ED for complaints of Weakness Currently, the patient is resting comfortably.  Physical Exam  BP (!) 88/54 (BP Location: Left Arm)   Pulse 62   Temp 98.5 F (36.9 C) (Oral)   Resp 17   SpO2 100%  Physical Exam Vitals and nursing note reviewed.  Constitutional:      General: He is not in acute distress.    Appearance: He is well-developed.  HENT:     Head: Normocephalic and atraumatic.  Eyes:     Conjunctiva/sclera: Conjunctivae normal.  Cardiovascular:     Rate and Rhythm: Normal rate and regular rhythm.     Heart sounds: No murmur heard. Pulmonary:     Effort: Pulmonary effort is normal. No respiratory distress.  Musculoskeletal:        General: No swelling.     Cervical back: Neck supple.  Skin:    General: Skin is warm and dry.  Neurological:     Mental Status: He is alert.  Psychiatric:        Mood and Affect: Mood normal.      ED Course / MDM  EKG:EKG Interpretation  Date/Time:  Friday April 18 2022 19:39:57 EST Ventricular Rate:  102 PR Interval:  125 QRS Duration: 100 QT Interval:  337 QTC Calculation: 439 R Axis:   44 Text Interpretation: Sinus tachycardia Left atrial enlargement Low voltage, precordial leads Abnormal R-wave progression, early transition Interpretation limited secondary to artifact Confirmed by Sherwood Gambler [phone]) on 04/18/2022 8:29:15 PM  I have reviewed the labs performed to date as well as medications administered while in observation.  Recent changes in the last 24 hours include social work evaluation and plan for discharge to SNF today.  Plan  Current plan is for discharge to SNF.    [name], MD 04/23/22 [phone]

## 2022-04-24 DIAGNOSIS — B372 Candidiasis of skin and nail: Secondary | ICD-10-CM | POA: Diagnosis not present

## 2022-04-24 DIAGNOSIS — G894 Chronic pain syndrome: Secondary | ICD-10-CM | POA: Diagnosis not present

## 2022-04-25 DIAGNOSIS — G8929 Other chronic pain: Secondary | ICD-10-CM | POA: Diagnosis not present

## 2022-04-25 DIAGNOSIS — A419 Sepsis, unspecified organism: Secondary | ICD-10-CM | POA: Diagnosis not present

## 2022-04-25 DIAGNOSIS — M545 Low back pain, unspecified: Secondary | ICD-10-CM | POA: Diagnosis not present

## 2022-04-25 DIAGNOSIS — E119 Type 2 diabetes mellitus without complications: Secondary | ICD-10-CM | POA: Diagnosis not present

## 2022-04-25 DIAGNOSIS — E7800 Pure hypercholesterolemia, unspecified: Secondary | ICD-10-CM | POA: Diagnosis not present

## 2022-04-26 DIAGNOSIS — E1169 Type 2 diabetes mellitus with other specified complication: Secondary | ICD-10-CM | POA: Diagnosis not present

## 2022-04-26 DIAGNOSIS — M545 Low back pain, unspecified: Secondary | ICD-10-CM | POA: Diagnosis not present

## 2022-04-26 DIAGNOSIS — N179 Acute kidney failure, unspecified: Secondary | ICD-10-CM | POA: Diagnosis not present

## 2022-04-26 DIAGNOSIS — G894 Chronic pain syndrome: Secondary | ICD-10-CM | POA: Diagnosis not present

## 2022-04-26 DIAGNOSIS — G8929 Other chronic pain: Secondary | ICD-10-CM | POA: Diagnosis not present

## 2022-04-26 DIAGNOSIS — J449 Chronic obstructive pulmonary disease, unspecified: Secondary | ICD-10-CM | POA: Diagnosis not present

## 2022-04-26 DIAGNOSIS — I5032 Chronic diastolic (congestive) heart failure: Secondary | ICD-10-CM | POA: Diagnosis not present

## 2022-04-26 DIAGNOSIS — B372 Candidiasis of skin and nail: Secondary | ICD-10-CM | POA: Diagnosis not present

## 2022-04-28 DIAGNOSIS — N179 Acute kidney failure, unspecified: Secondary | ICD-10-CM | POA: Diagnosis not present

## 2022-04-28 DIAGNOSIS — G894 Chronic pain syndrome: Secondary | ICD-10-CM | POA: Diagnosis not present

## 2022-04-30 DIAGNOSIS — R55 Syncope and collapse: Secondary | ICD-10-CM | POA: Diagnosis not present

## 2022-04-30 DIAGNOSIS — K0889 Other specified disorders of teeth and supporting structures: Secondary | ICD-10-CM | POA: Diagnosis not present

## 2022-05-10 DIAGNOSIS — B372 Candidiasis of skin and nail: Secondary | ICD-10-CM | POA: Diagnosis not present

## 2022-05-10 DIAGNOSIS — N200 Calculus of kidney: Secondary | ICD-10-CM | POA: Diagnosis not present

## 2022-05-10 DIAGNOSIS — I5032 Chronic diastolic (congestive) heart failure: Secondary | ICD-10-CM | POA: Diagnosis not present

## 2022-05-10 DIAGNOSIS — J449 Chronic obstructive pulmonary disease, unspecified: Secondary | ICD-10-CM | POA: Diagnosis not present

## 2022-05-10 DIAGNOSIS — N390 Urinary tract infection, site not specified: Secondary | ICD-10-CM | POA: Diagnosis not present

## 2022-05-10 DIAGNOSIS — J45909 Unspecified asthma, uncomplicated: Secondary | ICD-10-CM | POA: Diagnosis not present

## 2022-05-10 DIAGNOSIS — M1990 Unspecified osteoarthritis, unspecified site: Secondary | ICD-10-CM | POA: Diagnosis not present

## 2022-05-10 DIAGNOSIS — G894 Chronic pain syndrome: Secondary | ICD-10-CM | POA: Diagnosis not present

## 2022-05-10 DIAGNOSIS — G9341 Metabolic encephalopathy: Secondary | ICD-10-CM | POA: Diagnosis not present

## 2022-05-13 DIAGNOSIS — J449 Chronic obstructive pulmonary disease, unspecified: Secondary | ICD-10-CM | POA: Diagnosis not present

## 2022-05-13 DIAGNOSIS — I5032 Chronic diastolic (congestive) heart failure: Secondary | ICD-10-CM | POA: Diagnosis not present

## 2022-05-13 DIAGNOSIS — N390 Urinary tract infection, site not specified: Secondary | ICD-10-CM | POA: Diagnosis not present

## 2022-05-13 DIAGNOSIS — J45909 Unspecified asthma, uncomplicated: Secondary | ICD-10-CM | POA: Diagnosis not present

## 2022-05-13 DIAGNOSIS — B372 Candidiasis of skin and nail: Secondary | ICD-10-CM | POA: Diagnosis not present

## 2022-05-13 DIAGNOSIS — N200 Calculus of kidney: Secondary | ICD-10-CM | POA: Diagnosis not present

## 2022-05-13 DIAGNOSIS — G894 Chronic pain syndrome: Secondary | ICD-10-CM | POA: Diagnosis not present

## 2022-05-13 DIAGNOSIS — G9341 Metabolic encephalopathy: Secondary | ICD-10-CM | POA: Diagnosis not present

## 2022-05-13 DIAGNOSIS — M1990 Unspecified osteoarthritis, unspecified site: Secondary | ICD-10-CM | POA: Diagnosis not present

## 2022-05-15 DIAGNOSIS — J45909 Unspecified asthma, uncomplicated: Secondary | ICD-10-CM | POA: Diagnosis not present

## 2022-05-15 DIAGNOSIS — G894 Chronic pain syndrome: Secondary | ICD-10-CM | POA: Diagnosis not present

## 2022-05-15 DIAGNOSIS — G9341 Metabolic encephalopathy: Secondary | ICD-10-CM | POA: Diagnosis not present

## 2022-05-15 DIAGNOSIS — M1990 Unspecified osteoarthritis, unspecified site: Secondary | ICD-10-CM | POA: Diagnosis not present

## 2022-05-15 DIAGNOSIS — N390 Urinary tract infection, site not specified: Secondary | ICD-10-CM | POA: Diagnosis not present

## 2022-05-15 DIAGNOSIS — B372 Candidiasis of skin and nail: Secondary | ICD-10-CM | POA: Diagnosis not present

## 2022-05-15 DIAGNOSIS — N200 Calculus of kidney: Secondary | ICD-10-CM | POA: Diagnosis not present

## 2022-05-15 DIAGNOSIS — J449 Chronic obstructive pulmonary disease, unspecified: Secondary | ICD-10-CM | POA: Diagnosis not present

## 2022-05-15 DIAGNOSIS — I5032 Chronic diastolic (congestive) heart failure: Secondary | ICD-10-CM | POA: Diagnosis not present

## 2022-05-19 DIAGNOSIS — J45909 Unspecified asthma, uncomplicated: Secondary | ICD-10-CM | POA: Diagnosis not present

## 2022-05-19 DIAGNOSIS — N200 Calculus of kidney: Secondary | ICD-10-CM | POA: Diagnosis not present

## 2022-05-19 DIAGNOSIS — G9341 Metabolic encephalopathy: Secondary | ICD-10-CM | POA: Diagnosis not present

## 2022-05-19 DIAGNOSIS — B372 Candidiasis of skin and nail: Secondary | ICD-10-CM | POA: Diagnosis not present

## 2022-05-19 DIAGNOSIS — M1990 Unspecified osteoarthritis, unspecified site: Secondary | ICD-10-CM | POA: Diagnosis not present

## 2022-05-19 DIAGNOSIS — G894 Chronic pain syndrome: Secondary | ICD-10-CM | POA: Diagnosis not present

## 2022-05-19 DIAGNOSIS — N390 Urinary tract infection, site not specified: Secondary | ICD-10-CM | POA: Diagnosis not present

## 2022-05-19 DIAGNOSIS — I5032 Chronic diastolic (congestive) heart failure: Secondary | ICD-10-CM | POA: Diagnosis not present

## 2022-05-19 DIAGNOSIS — J449 Chronic obstructive pulmonary disease, unspecified: Secondary | ICD-10-CM | POA: Diagnosis not present

## 2022-05-21 DIAGNOSIS — G894 Chronic pain syndrome: Secondary | ICD-10-CM | POA: Diagnosis not present

## 2022-05-21 DIAGNOSIS — B372 Candidiasis of skin and nail: Secondary | ICD-10-CM | POA: Diagnosis not present

## 2022-05-21 DIAGNOSIS — J45909 Unspecified asthma, uncomplicated: Secondary | ICD-10-CM | POA: Diagnosis not present

## 2022-05-21 DIAGNOSIS — G9341 Metabolic encephalopathy: Secondary | ICD-10-CM | POA: Diagnosis not present

## 2022-05-21 DIAGNOSIS — M1990 Unspecified osteoarthritis, unspecified site: Secondary | ICD-10-CM | POA: Diagnosis not present

## 2022-05-21 DIAGNOSIS — N200 Calculus of kidney: Secondary | ICD-10-CM | POA: Diagnosis not present

## 2022-05-21 DIAGNOSIS — N390 Urinary tract infection, site not specified: Secondary | ICD-10-CM | POA: Diagnosis not present

## 2022-05-21 DIAGNOSIS — J449 Chronic obstructive pulmonary disease, unspecified: Secondary | ICD-10-CM | POA: Diagnosis not present

## 2022-05-21 DIAGNOSIS — I5032 Chronic diastolic (congestive) heart failure: Secondary | ICD-10-CM | POA: Diagnosis not present

## 2022-05-22 DIAGNOSIS — J45909 Unspecified asthma, uncomplicated: Secondary | ICD-10-CM | POA: Diagnosis not present

## 2022-05-22 DIAGNOSIS — M1990 Unspecified osteoarthritis, unspecified site: Secondary | ICD-10-CM | POA: Diagnosis not present

## 2022-05-22 DIAGNOSIS — I5032 Chronic diastolic (congestive) heart failure: Secondary | ICD-10-CM | POA: Diagnosis not present

## 2022-05-22 DIAGNOSIS — G894 Chronic pain syndrome: Secondary | ICD-10-CM | POA: Diagnosis not present

## 2022-05-22 DIAGNOSIS — N390 Urinary tract infection, site not specified: Secondary | ICD-10-CM | POA: Diagnosis not present

## 2022-05-22 DIAGNOSIS — B372 Candidiasis of skin and nail: Secondary | ICD-10-CM | POA: Diagnosis not present

## 2022-05-22 DIAGNOSIS — G9341 Metabolic encephalopathy: Secondary | ICD-10-CM | POA: Diagnosis not present

## 2022-05-22 DIAGNOSIS — J449 Chronic obstructive pulmonary disease, unspecified: Secondary | ICD-10-CM | POA: Diagnosis not present

## 2022-05-22 DIAGNOSIS — N200 Calculus of kidney: Secondary | ICD-10-CM | POA: Diagnosis not present

## 2022-05-27 DIAGNOSIS — B372 Candidiasis of skin and nail: Secondary | ICD-10-CM | POA: Diagnosis not present

## 2022-05-27 DIAGNOSIS — N390 Urinary tract infection, site not specified: Secondary | ICD-10-CM | POA: Diagnosis not present

## 2022-05-27 DIAGNOSIS — G9341 Metabolic encephalopathy: Secondary | ICD-10-CM | POA: Diagnosis not present

## 2022-05-27 DIAGNOSIS — G894 Chronic pain syndrome: Secondary | ICD-10-CM | POA: Diagnosis not present

## 2022-05-27 DIAGNOSIS — I5032 Chronic diastolic (congestive) heart failure: Secondary | ICD-10-CM | POA: Diagnosis not present

## 2022-05-27 DIAGNOSIS — M1990 Unspecified osteoarthritis, unspecified site: Secondary | ICD-10-CM | POA: Diagnosis not present

## 2022-05-27 DIAGNOSIS — J449 Chronic obstructive pulmonary disease, unspecified: Secondary | ICD-10-CM | POA: Diagnosis not present

## 2022-05-27 DIAGNOSIS — J45909 Unspecified asthma, uncomplicated: Secondary | ICD-10-CM | POA: Diagnosis not present

## 2022-05-27 DIAGNOSIS — N200 Calculus of kidney: Secondary | ICD-10-CM | POA: Diagnosis not present

## 2022-05-28 DIAGNOSIS — I272 Pulmonary hypertension, unspecified: Secondary | ICD-10-CM | POA: Diagnosis not present

## 2022-05-30 DIAGNOSIS — I5032 Chronic diastolic (congestive) heart failure: Secondary | ICD-10-CM | POA: Diagnosis not present

## 2022-05-30 DIAGNOSIS — G9341 Metabolic encephalopathy: Secondary | ICD-10-CM | POA: Diagnosis not present

## 2022-05-30 DIAGNOSIS — J449 Chronic obstructive pulmonary disease, unspecified: Secondary | ICD-10-CM | POA: Diagnosis not present

## 2022-05-30 DIAGNOSIS — N390 Urinary tract infection, site not specified: Secondary | ICD-10-CM | POA: Diagnosis not present

## 2022-05-30 DIAGNOSIS — M1990 Unspecified osteoarthritis, unspecified site: Secondary | ICD-10-CM | POA: Diagnosis not present

## 2022-05-30 DIAGNOSIS — N200 Calculus of kidney: Secondary | ICD-10-CM | POA: Diagnosis not present

## 2022-05-30 DIAGNOSIS — B372 Candidiasis of skin and nail: Secondary | ICD-10-CM | POA: Diagnosis not present

## 2022-05-30 DIAGNOSIS — G894 Chronic pain syndrome: Secondary | ICD-10-CM | POA: Diagnosis not present

## 2022-05-30 DIAGNOSIS — J45909 Unspecified asthma, uncomplicated: Secondary | ICD-10-CM | POA: Diagnosis not present

## 2022-06-02 DIAGNOSIS — N390 Urinary tract infection, site not specified: Secondary | ICD-10-CM | POA: Diagnosis not present

## 2022-06-02 DIAGNOSIS — G9341 Metabolic encephalopathy: Secondary | ICD-10-CM | POA: Diagnosis not present

## 2022-06-02 DIAGNOSIS — G894 Chronic pain syndrome: Secondary | ICD-10-CM | POA: Diagnosis not present

## 2022-06-02 DIAGNOSIS — J45909 Unspecified asthma, uncomplicated: Secondary | ICD-10-CM | POA: Diagnosis not present

## 2022-06-02 DIAGNOSIS — J449 Chronic obstructive pulmonary disease, unspecified: Secondary | ICD-10-CM | POA: Diagnosis not present

## 2022-06-02 DIAGNOSIS — B372 Candidiasis of skin and nail: Secondary | ICD-10-CM | POA: Diagnosis not present

## 2022-06-02 DIAGNOSIS — M1990 Unspecified osteoarthritis, unspecified site: Secondary | ICD-10-CM | POA: Diagnosis not present

## 2022-06-02 DIAGNOSIS — N200 Calculus of kidney: Secondary | ICD-10-CM | POA: Diagnosis not present

## 2022-06-02 DIAGNOSIS — I5032 Chronic diastolic (congestive) heart failure: Secondary | ICD-10-CM | POA: Diagnosis not present

## 2022-06-12 DIAGNOSIS — B372 Candidiasis of skin and nail: Secondary | ICD-10-CM | POA: Diagnosis not present

## 2022-06-12 DIAGNOSIS — N200 Calculus of kidney: Secondary | ICD-10-CM | POA: Diagnosis not present

## 2022-06-12 DIAGNOSIS — I5032 Chronic diastolic (congestive) heart failure: Secondary | ICD-10-CM | POA: Diagnosis not present

## 2022-06-12 DIAGNOSIS — J45909 Unspecified asthma, uncomplicated: Secondary | ICD-10-CM | POA: Diagnosis not present

## 2022-06-12 DIAGNOSIS — G894 Chronic pain syndrome: Secondary | ICD-10-CM | POA: Diagnosis not present

## 2022-06-12 DIAGNOSIS — J449 Chronic obstructive pulmonary disease, unspecified: Secondary | ICD-10-CM | POA: Diagnosis not present

## 2022-06-12 DIAGNOSIS — G9341 Metabolic encephalopathy: Secondary | ICD-10-CM | POA: Diagnosis not present

## 2022-06-12 DIAGNOSIS — N390 Urinary tract infection, site not specified: Secondary | ICD-10-CM | POA: Diagnosis not present

## 2022-06-12 DIAGNOSIS — M1990 Unspecified osteoarthritis, unspecified site: Secondary | ICD-10-CM | POA: Diagnosis not present

## 2022-06-13 DIAGNOSIS — N202 Calculus of kidney with calculus of ureter: Secondary | ICD-10-CM | POA: Diagnosis not present

## 2022-06-16 ENCOUNTER — Other Ambulatory Visit: Payer: Self-pay | Admitting: Urology

## 2022-06-16 DIAGNOSIS — I5032 Chronic diastolic (congestive) heart failure: Secondary | ICD-10-CM | POA: Diagnosis not present

## 2022-06-16 DIAGNOSIS — N200 Calculus of kidney: Secondary | ICD-10-CM | POA: Diagnosis not present

## 2022-06-16 DIAGNOSIS — J449 Chronic obstructive pulmonary disease, unspecified: Secondary | ICD-10-CM | POA: Diagnosis not present

## 2022-06-16 DIAGNOSIS — G894 Chronic pain syndrome: Secondary | ICD-10-CM | POA: Diagnosis not present

## 2022-06-16 DIAGNOSIS — M1990 Unspecified osteoarthritis, unspecified site: Secondary | ICD-10-CM | POA: Diagnosis not present

## 2022-06-16 DIAGNOSIS — B372 Candidiasis of skin and nail: Secondary | ICD-10-CM | POA: Diagnosis not present

## 2022-06-16 DIAGNOSIS — J45909 Unspecified asthma, uncomplicated: Secondary | ICD-10-CM | POA: Diagnosis not present

## 2022-06-16 DIAGNOSIS — N390 Urinary tract infection, site not specified: Secondary | ICD-10-CM | POA: Diagnosis not present

## 2022-06-16 DIAGNOSIS — G9341 Metabolic encephalopathy: Secondary | ICD-10-CM | POA: Diagnosis not present

## 2022-06-23 NOTE — Progress Notes (Signed)
COVID Vaccine Completed:  Yes  Date of COVID positive in last 90 days:  PCP - Antony Contras, MD Cardiologist - Valentino Hue, MD/Daniel Haroldine Laws, MD Pulmonologist - Sherrilyn Rist, MD  Chest x-ray - 03-31-22 Epic EKG - 04-18-22 Epic Stress Test -  ECHO - 05-28-22 [facility] Cardiac Cath - 09-22-19 Epic Pacemaker/ICD device last checked: Spinal Cord Stimulator: Pain Pump  Bowel Prep -   Sleep Study - Yes, +sleep apnea CPAP -   Fasting Blood Sugar -  Checks Blood Sugar _____ times a day  Last dose of GLP1 agonist-  N/A GLP1 instructions:  N/A   Last dose of SGLT-2 inhibitors-  N/A SGLT-2 instructions: N/A   Blood Thinner Instructions: Aspirin Instructions: Last Dose:  Activity level:  Can go up a flight of stairs and perform activities of daily living without stopping and without symptoms of chest pain or shortness of breath.  Able to exercise without symptoms  Unable to go up a flight of stairs without symptoms of     Anesthesia review:  CHF, COPD,  pulmonary HTN, DM, OSA.  Poor R wave progression on EKG  Patient denies shortness of breath, fever, cough and chest pain at PAT appointment  Patient verbalized understanding of instructions that were given to them at the PAT appointment. Patient was also instructed that they will need to review over the PAT instructions again at home before surgery.

## 2022-06-23 NOTE — Patient Instructions (Signed)
SURGICAL WAITING ROOM VISITATION Patients having surgery or a procedure may have no more than 2 support people in the waiting area - these visitors may rotate.    If the patient needs to stay at the hospital during part of their recovery, the visitor guidelines for inpatient rooms apply. Pre-op nurse will coordinate an appropriate time for 1 support person to accompany patient in pre-op.  This support person may not rotate.    Please refer to the [facility] website for the visitor guidelines for Inpatients (after your surgery is over and you are in a regular room).   Due to an increase in RSV and influenza rates and associated hospitalizations, children ages 41 and under may not visit patients in [place].     Your procedure is scheduled on: 07-02-22   Report to [facility] Main Entrance    Report to admitting at 10:00 AM   Call this number if you have problems the morning of surgery [phone]   Do not eat food or drink liquids :After Midnight.           If you have questions, please contact your surgeon's office.   FOLLOW  ANY ADDITIONAL PRE OP INSTRUCTIONS YOU RECEIVED FROM YOUR SURGEON'S OFFICE!!!     Oral Hygiene is also important to reduce your risk of infection.                                    Remember - BRUSH YOUR TEETH THE MORNING OF SURGERY WITH YOUR REGULAR TOOTHPASTE   Do NOT smoke after Midnight   Take these medicines the morning of surgery with A SIP OF WATER:   Esomeprazole  Tylenol if needed  DO NOT TAKE ANY ORAL DIABETIC MEDICATIONS DAY OF YOUR SURGERY  Bring CPAP mask and tubing day of surgery.                              You may not have any metal on your body including  jewelry, and body piercing             Do not wear, lotions, powders, cologne, or deodorant              Men may shave face and neck.   Do not bring valuables to the hospital. [place].   Contacts, dentures or  bridgework may not be worn into surgery.  DO NOT [place]. PHARMACY WILL DISPENSE MEDICATIONS LISTED ON YOUR MEDICATION LIST TO YOU DURING YOUR ADMISSION [place]!    Patients discharged on the day of surgery will not be allowed to drive home.  Someone NEEDS to stay with you for the first 24 hours after anesthesia.   Special Instructions: Bring a copy of your healthcare power of attorney and living will documents the day of surgery if you haven't scanned them before.              Please read over the following fact sheets you were given: IF [place]  If you received a COVID test during your pre-op visit  it is requested that you wear a mask when out in public, stay away from anyone that may not be feeling well and notify your surgeon if  you develop symptoms. If you test positive for Covid or have been in contact with anyone that has tested positive in the last 10 days please notify you surgeon.  [place] - Preparing for Surgery Before surgery, you can play an important role.  Because skin is not sterile, your skin needs to be as free of germs as possible.  You can reduce the number of germs on your skin by washing with CHG (chlorahexidine gluconate) soap before surgery.  CHG is an antiseptic cleaner which kills germs and bonds with the skin to continue killing germs even after washing. Please DO NOT use if you have an allergy to CHG or antibacterial soaps.  If your skin becomes reddened/irritated stop using the CHG and inform your nurse when you arrive at Short Stay. Do not shave (including legs and underarms) for at least 48 hours prior to the first CHG shower.  You may shave your face/neck.  Please follow these instructions carefully:  1.  Shower with CHG Soap the night before surgery and the  morning of surgery.  2.  If you choose to wash your hair, wash your hair first as usual  with your normal  shampoo.  3.  After you shampoo, rinse your hair and body thoroughly to remove the shampoo.                             4.  Use CHG as you would any other liquid soap.  You can apply chg directly to the skin and wash.  Gently with a scrungie or clean washcloth.  5.  Apply the CHG Soap to your body ONLY FROM THE NECK DOWN.   Do   not use on face/ open                           Wound or open sores. Avoid contact with eyes, ears mouth and   genitals (private parts).                       Wash face,  Genitals (private parts) with your normal soap.             6.  Wash thoroughly, paying special attention to the area where your    surgery  will be performed.  7.  Thoroughly rinse your body with warm water from the neck down.  8.  DO NOT shower/wash with your normal soap after using and rinsing off the CHG Soap.                9.  Pat yourself dry with a clean towel.            10.  Wear clean pajamas.            11.  Place clean sheets on your bed the night of your first shower and do not  sleep with pets. Day of Surgery : Do not apply any lotions/deodorants the morning of surgery.  Please wear clean clothes to the hospital/surgery center.  FAILURE TO FOLLOW THESE INSTRUCTIONS MAY RESULT IN THE CANCELLATION OF YOUR SURGERY  PATIENT SIGNATURE_________________________________  NURSE SIGNATURE__________________________________  ________________________________________________________________________

## 2022-06-25 ENCOUNTER — Encounter (HOSPITAL_COMMUNITY)
Admission: RE | Admit: 2022-06-25 | Discharge: 2022-06-25 | Disposition: A | Discharge status: Home / Self Care | Payer: Medicare HMO | Source: Ambulatory Visit | Attending: Anesthesiology | Admitting: Anesthesiology

## 2022-06-25 DIAGNOSIS — E119 Type 2 diabetes mellitus without complications: Secondary | ICD-10-CM

## 2022-06-25 DIAGNOSIS — K769 Liver disease, unspecified: Secondary | ICD-10-CM

## 2022-06-27 NOTE — Patient Instructions (Signed)
SURGICAL WAITING ROOM VISITATION Patients having surgery or a procedure may have no more than 2 support people in the waiting area - these visitors may rotate.    If the patient needs to stay at the hospital during part of their recovery, the visitor guidelines for inpatient rooms apply. Pre-op nurse will coordinate an appropriate time for 1 support person to accompany patient in pre-op.  This support person may not rotate.    Please refer to the [facility] website for the visitor guidelines for Inpatients (after your surgery is over and you are in a regular room).   Due to an increase in RSV and influenza rates and associated hospitalizations, children ages 57 and under may not visit patients in [place].     Your procedure is scheduled on: 07-02-22   Report to [facility] Main Entrance    Report to admitting at 10:00 AM   Call this number if you have problems the morning of surgery [phone]   Do not eat food or drink liquids :After Midnight.          If you have questions, please contact your surgeon's office.   FOLLOW  ANY ADDITIONAL PRE OP INSTRUCTIONS YOU RECEIVED FROM YOUR SURGEON'S OFFICE!!!     Oral Hygiene is also important to reduce your risk of infection.                                    Remember - BRUSH YOUR TEETH THE MORNING OF SURGERY WITH YOUR REGULAR TOOTHPASTE   Do NOT smoke after Midnight   Take these medicines the morning of surgery with A SIP OF WATER:   Esomeprazole  Cephalexin  Duloxetine  Pregabalin  Promethazine  MS Contin  Tylenol if needed  DO NOT TAKE ANY ORAL DIABETIC MEDICATIONS DAY OF YOUR SURGERY  Bring CPAP mask and tubing day of surgery.                              You may not have any metal on your body including jewelry, and body piercing             Do not wear  lotions, powders, cologne, or deodorant              Men may shave face and neck.   Do not bring valuables to the hospital. [place].   Contacts, dentures or bridgework may not be worn into surgery.   DO NOT [place]. PHARMACY WILL DISPENSE MEDICATIONS LISTED ON YOUR MEDICATION LIST TO YOU DURING YOUR ADMISSION [place]!    Patients discharged on the day of surgery will not be allowed to drive home.  Someone NEEDS to stay with you for the first 24 hours after anesthesia.   Special Instructions: Bring a copy of your healthcare power of attorney and living will documents the day of surgery if you haven't scanned them before.              Please read over the following fact sheets you were given: IF YOU HAVE QUESTIONS ABOUT YOUR PRE-OP INSTRUCTIONS PLEASE CALL [phone] [place]  If you received a COVID test during your pre-op visit  it is requested that you wear a mask when out in public, stay away from anyone  that may not be feeling well and notify your surgeon if you develop symptoms. If you test positive for Covid or have been in contact with anyone that has tested positive in the last 10 days please notify you surgeon.  [place] - Preparing for Surgery Before surgery, you can play an important role.  Because skin is not sterile, your skin needs to be as free of germs as possible.  You can reduce the number of germs on your skin by washing with CHG (chlorahexidine gluconate) soap before surgery.  CHG is an antiseptic cleaner which kills germs and bonds with the skin to continue killing germs even after washing. Please DO NOT use if you have an allergy to CHG or antibacterial soaps.  If your skin becomes reddened/irritated stop using the CHG and inform your nurse when you arrive at Short Stay. Do not shave (including legs and underarms) for at least 48 hours prior to the first CHG shower.  You may shave your face/neck.  Please follow these instructions carefully:  1.  Shower with CHG Soap the night before surgery and the  morning of surgery.  2.  If you  choose to wash your hair, wash your hair first as usual with your normal  shampoo.  3.  After you shampoo, rinse your hair and body thoroughly to remove the shampoo.                             4.  Use CHG as you would any other liquid soap.  You can apply chg directly to the skin and wash.  Gently with a scrungie or clean washcloth.  5.  Apply the CHG Soap to your body ONLY FROM THE NECK DOWN.   Do   not use on face/ open                           Wound or open sores. Avoid contact with eyes, ears mouth and   genitals (private parts).                       Wash face,  Genitals (private parts) with your normal soap.             6.  Wash thoroughly, paying special attention to the area where your    surgery  will be performed.  7.  Thoroughly rinse your body with warm water from the neck down.  8.  DO NOT shower/wash with your normal soap after using and rinsing off the CHG Soap.                9.  Pat yourself dry with a clean towel.            10.  Wear clean pajamas.            11.  Place clean sheets on your bed the night of your first shower and do not  sleep with pets. Day of Surgery : Do not apply any lotions/deodorants the morning of surgery.  Please wear clean clothes to the hospital/surgery center.  FAILURE TO FOLLOW THESE INSTRUCTIONS MAY RESULT IN THE CANCELLATION OF YOUR SURGERY  PATIENT SIGNATURE_________________________________  NURSE SIGNATURE__________________________________  ________________________________________________________________________

## 2022-06-30 ENCOUNTER — Encounter (HOSPITAL_COMMUNITY): Payer: Self-pay

## 2022-06-30 ENCOUNTER — Other Ambulatory Visit: Payer: Self-pay

## 2022-06-30 ENCOUNTER — Encounter (HOSPITAL_COMMUNITY)
Admission: RE | Admit: 2022-06-30 | Discharge: 2022-06-30 | Disposition: A | Discharge status: Home / Self Care | Payer: Medicare HMO | Source: Ambulatory Visit | Attending: Urology | Admitting: Urology

## 2022-06-30 DIAGNOSIS — K769 Liver disease, unspecified: Secondary | ICD-10-CM | POA: Insufficient documentation

## 2022-06-30 DIAGNOSIS — E119 Type 2 diabetes mellitus without complications: Secondary | ICD-10-CM | POA: Diagnosis not present

## 2022-06-30 DIAGNOSIS — Z01812 Encounter for preprocedural laboratory examination: Secondary | ICD-10-CM | POA: Insufficient documentation

## 2022-06-30 HISTORY — DX: Headache, unspecified: R51.9

## 2022-06-30 HISTORY — DX: Personal history of urinary calculi: Z87.442

## 2022-06-30 HISTORY — DX: Dependence on supplemental oxygen: Z99.81

## 2022-06-30 HISTORY — DX: Prediabetes: R73.03

## 2022-06-30 LAB — COMPREHENSIVE METABOLIC PANEL
ALT: 12 U/L (ref 0–44)
AST: 16 U/L (ref 15–41)
Albumin: 3.3 g/dL — ABNORMAL LOW (ref 3.5–5.0)
Alkaline Phosphatase: 67 U/L (ref 38–126)
Anion gap: 9 (ref 5–15)
BUN: 10 mg/dL (ref 6–20)
CO2: 27 mmol/L (ref 22–32)
Calcium: 8.8 mg/dL — ABNORMAL LOW (ref 8.9–10.3)
Chloride: 98 mmol/L (ref 98–111)
Creatinine, Ser: 0.83 mg/dL (ref 0.61–1.24)
GFR, Estimated: >60 mL/min (ref >60)
Glucose, Bld: 143 mg/dL — ABNORMAL HIGH (ref 70–99)
Potassium: 4.5 mmol/L (ref 3.5–5.1)
Sodium: 134 mmol/L — ABNORMAL LOW (ref 135–145)
Total Bilirubin: 0.4 mg/dL (ref 0.3–1.2)
Total Protein: 7.9 g/dL (ref 6.5–8.1)

## 2022-06-30 LAB — GLUCOSE, CAPILLARY: Glucose-Capillary: 158 mg/dL — ABNORMAL HIGH (ref 70–99)

## 2022-06-30 NOTE — Progress Notes (Signed)
Covid Vaccine Completed:  Yes  Date of COVID positive in last 90 days:  No   PCP - Antony Contras, MD Cardiologist - Valentino Hue, MD/Daniel Bensimhon, MD Pulmonologist - Sherrilyn Rist, MD   Chest x-ray - 03-31-22 Epic EKG - 04-18-22 Epic Stress Test - N/A ECHO - 05-28-22 [facility] Cardiac Cath - 09-22-19 Epic Pacemaker/ICD device last checked: Spinal Cord Stimulator: Pain Pump L abdomen   Bowel Prep - N/A   Sleep Study - Yes, +sleep apnea CPAP - No, uses oxygen   Fasting Blood Sugar -  Checks Blood Sugar - does not check    Last dose of GLP1 agonist-  N/A GLP1 instructions:  N/A   Last dose of SGLT-2 inhibitors-  N/A SGLT-2 instructions: N/A    Blood Thinner Instructions: Aspirin Instructions:  ASA 81.  Last Dose:  06-25-22   Activity level:   Unable to climb stairs, patient is disabled and uses a wheelchair.  Able to perform activities of daily living without symptoms of chest pain.  Patient has shortness of breath that requires O2 at 2L.            Anesthesia review:  CHF, COPD,  pulmonary HTN, DM, OSA.  Poor R wave progression on EKG  O2 2L at all times.  Patient did not wear to PAT appt and sats 94% on room air. No shortness of breath noted during visit     Patient Patient denies shortness of breath, fever, cough and chest pain at PAT appointment  Verbalized understanding of instructions that were given to them at the PAT appointment. Patient was also instructed that they will need to review over the PAT instructions again at home before surgery.

## 2022-07-01 LAB — HEMOGLOBIN A1C
Hgb A1c MFr Bld: 5.5 % (ref 4.8–5.6)
Mean Plasma Glucose: 111 mg/dL

## 2022-07-02 ENCOUNTER — Ambulatory Visit (HOSPITAL_COMMUNITY): Payer: Medicare HMO | Admitting: Physician Assistant

## 2022-07-02 ENCOUNTER — Encounter (HOSPITAL_COMMUNITY)
Admission: RE | Disposition: A | Discharge status: Home / Self Care | Payer: Self-pay | Source: Home / Self Care | Attending: Urology

## 2022-07-02 ENCOUNTER — Ambulatory Visit (HOSPITAL_BASED_OUTPATIENT_CLINIC_OR_DEPARTMENT_OTHER): Payer: Medicare HMO | Admitting: Certified Registered Nurse Anesthetist

## 2022-07-02 ENCOUNTER — Ambulatory Visit (HOSPITAL_COMMUNITY)
Admission: RE | Admit: 2022-07-02 | Discharge: 2022-07-02 | Disposition: A | Discharge status: Home / Self Care | Payer: Medicare HMO | Attending: Urology | Admitting: Urology

## 2022-07-02 ENCOUNTER — Encounter (HOSPITAL_COMMUNITY): Payer: Self-pay | Admitting: Urology

## 2022-07-02 ENCOUNTER — Ambulatory Visit (HOSPITAL_COMMUNITY): Payer: Medicare HMO

## 2022-07-02 DIAGNOSIS — K219 Gastro-esophageal reflux disease without esophagitis: Secondary | ICD-10-CM | POA: Diagnosis not present

## 2022-07-02 DIAGNOSIS — Z9981 Dependence on supplemental oxygen: Secondary | ICD-10-CM | POA: Insufficient documentation

## 2022-07-02 DIAGNOSIS — N201 Calculus of ureter: Secondary | ICD-10-CM | POA: Diagnosis not present

## 2022-07-02 DIAGNOSIS — K449 Diaphragmatic hernia without obstruction or gangrene: Secondary | ICD-10-CM | POA: Insufficient documentation

## 2022-07-02 DIAGNOSIS — J449 Chronic obstructive pulmonary disease, unspecified: Secondary | ICD-10-CM | POA: Diagnosis not present

## 2022-07-02 DIAGNOSIS — I272 Pulmonary hypertension, unspecified: Secondary | ICD-10-CM | POA: Diagnosis not present

## 2022-07-02 DIAGNOSIS — N202 Calculus of kidney with calculus of ureter: Secondary | ICD-10-CM

## 2022-07-02 DIAGNOSIS — F32A Depression, unspecified: Secondary | ICD-10-CM | POA: Diagnosis not present

## 2022-07-02 DIAGNOSIS — G4730 Sleep apnea, unspecified: Secondary | ICD-10-CM | POA: Diagnosis not present

## 2022-07-02 DIAGNOSIS — E119 Type 2 diabetes mellitus without complications: Secondary | ICD-10-CM | POA: Diagnosis not present

## 2022-07-02 DIAGNOSIS — I509 Heart failure, unspecified: Secondary | ICD-10-CM

## 2022-07-02 DIAGNOSIS — I5032 Chronic diastolic (congestive) heart failure: Secondary | ICD-10-CM | POA: Diagnosis not present

## 2022-07-02 DIAGNOSIS — I110 Hypertensive heart disease with heart failure: Secondary | ICD-10-CM

## 2022-07-02 DIAGNOSIS — N200 Calculus of kidney: Secondary | ICD-10-CM | POA: Diagnosis not present

## 2022-07-02 HISTORY — PX: CYSTOSCOPY/URETEROSCOPY/HOLMIUM LASER/STENT PLACEMENT: SHX6546

## 2022-07-02 SURGERY — CYSTOSCOPY/URETEROSCOPY/HOLMIUM LASER/STENT PLACEMENT
Anesthesia: General | Laterality: Bilateral

## 2022-07-02 MED ORDER — PHENYLEPHRINE 80 MCG/ML (10ML) SYRINGE FOR IV PUSH (FOR BLOOD PRESSURE SUPPORT)
PREFILLED_SYRINGE | INTRAVENOUS | Status: DC | PRN
  Administered 2022-07-02 (×4): 80 ug via INTRAVENOUS

## 2022-07-02 MED ORDER — MIDAZOLAM HCL 5 MG/5ML IJ SOLN
INTRAMUSCULAR | Status: DC | PRN
  Administered 2022-07-02: 2 mg via INTRAVENOUS

## 2022-07-02 MED ORDER — OXYCODONE HCL 5 MG/5ML PO SOLN: 5.0000 mg | Freq: Once | ORAL | Status: AC | PRN

## 2022-07-02 MED ORDER — LIDOCAINE 2% (20 MG/ML) 5 ML SYRINGE
INTRAMUSCULAR | Status: DC | PRN
  Administered 2022-07-02: 100 mg via INTRAVENOUS

## 2022-07-02 MED ORDER — PHENYLEPHRINE 80 MCG/ML (10ML) SYRINGE FOR IV PUSH (FOR BLOOD PRESSURE SUPPORT)
PREFILLED_SYRINGE | INTRAVENOUS | Status: AC
  Filled 2022-07-02: qty 10

## 2022-07-02 MED ORDER — OXYCODONE HCL 5 MG PO TABS
ORAL_TABLET | ORAL | Status: AC
  Filled 2022-07-02: qty 1

## 2022-07-02 MED ORDER — PROMETHAZINE HCL 25 MG/ML IJ SOLN: 6.2500 mg | INTRAMUSCULAR | Status: DC | PRN

## 2022-07-02 MED ORDER — FENTANYL CITRATE (PF) 100 MCG/2ML IJ SOLN
INTRAMUSCULAR | Status: DC | PRN
  Administered 2022-07-02 (×4): 25 ug via INTRAVENOUS

## 2022-07-02 MED ORDER — HYDROMORPHONE HCL 1 MG/ML IJ SOLN
0.2500 mg | INTRAMUSCULAR | Status: DC | PRN
  Administered 2022-07-02: 0.5 mg via INTRAVENOUS

## 2022-07-02 MED ORDER — HYDROMORPHONE HCL 1 MG/ML IJ SOLN
INTRAMUSCULAR | Status: AC
  Filled 2022-07-02: qty 1

## 2022-07-02 MED ORDER — FENTANYL CITRATE (PF) 100 MCG/2ML IJ SOLN
INTRAMUSCULAR | Status: AC
  Filled 2022-07-02: qty 2

## 2022-07-02 MED ORDER — CEPHALEXIN 500 MG PO CAPS: 500.0000 mg | ORAL_CAPSULE | Freq: Two times a day (BID) | ORAL | 0 refills | Status: AC

## 2022-07-02 MED ORDER — CHLORHEXIDINE GLUCONATE 0.12 % MT SOLN
15.0000 mL | Freq: Once | OROMUCOSAL | Status: AC
  Administered 2022-07-02: 15 mL via OROMUCOSAL

## 2022-07-02 MED ORDER — DEXAMETHASONE SODIUM PHOSPHATE 10 MG/ML IJ SOLN
INTRAMUSCULAR | Status: DC | PRN
  Administered 2022-07-02: 10 mg via INTRAVENOUS

## 2022-07-02 MED ORDER — ONDANSETRON HCL 4 MG/2ML IJ SOLN
INTRAMUSCULAR | Status: AC
  Filled 2022-07-02: qty 2

## 2022-07-02 MED ORDER — ONDANSETRON HCL 4 MG/2ML IJ SOLN
INTRAMUSCULAR | Status: DC | PRN
  Administered 2022-07-02: 4 mg via INTRAVENOUS

## 2022-07-02 MED ORDER — DEXAMETHASONE SODIUM PHOSPHATE 10 MG/ML IJ SOLN
INTRAMUSCULAR | Status: AC
  Filled 2022-07-02: qty 1

## 2022-07-02 MED ORDER — LIDOCAINE HCL (PF) 2 % IJ SOLN
INTRAMUSCULAR | Status: AC
  Filled 2022-07-02: qty 5

## 2022-07-02 MED ORDER — IOHEXOL 300 MG/ML  SOLN
INTRAMUSCULAR | Status: DC | PRN
  Administered 2022-07-02: 20 mL via URETHRAL

## 2022-07-02 MED ORDER — LACTATED RINGERS IV SOLN: INTRAVENOUS | Status: DC

## 2022-07-02 MED ORDER — MIDAZOLAM HCL 2 MG/2ML IJ SOLN
INTRAMUSCULAR | Status: AC
  Filled 2022-07-02: qty 2

## 2022-07-02 MED ORDER — ORAL CARE MOUTH RINSE: 15.0000 mL | Freq: Once | OROMUCOSAL | Status: AC

## 2022-07-02 MED ORDER — CEFAZOLIN IN SODIUM CHLORIDE 3-0.9 GM/100ML-% IV SOLN
3.0000 g | INTRAVENOUS | Status: AC
  Administered 2022-07-02: 3 g via INTRAVENOUS
  Filled 2022-07-02: qty 100

## 2022-07-02 MED ORDER — PROPOFOL 10 MG/ML IV BOLUS
INTRAVENOUS | Status: DC | PRN
  Administered 2022-07-02: 20 mg via INTRAVENOUS
  Administered 2022-07-02: 200 mg via INTRAVENOUS

## 2022-07-02 MED ORDER — OXYCODONE HCL 5 MG PO TABS
5.0000 mg | ORAL_TABLET | Freq: Once | ORAL | Status: AC | PRN
  Administered 2022-07-02: 5 mg via ORAL

## 2022-07-02 MED ORDER — AMISULPRIDE (ANTIEMETIC) 5 MG/2ML IV SOLN: 10.0000 mg | Freq: Once | INTRAVENOUS | Status: DC | PRN

## 2022-07-02 MED ORDER — PROPOFOL 10 MG/ML IV BOLUS
INTRAVENOUS | Status: AC
  Filled 2022-07-02: qty 20

## 2022-07-02 SURGICAL SUPPLY — 22 items
BAG URO CATCHER STRL LF (MISCELLANEOUS) ×1 IMPLANT
BASKET ZERO TIP NITINOL 2.4FR (BASKET) IMPLANT
BSKT STON RTRVL ZERO TP 2.4FR (BASKET) ×1
CATH URETL OPEN 5X70 (CATHETERS) ×1 IMPLANT
CLOTH BEACON ORANGE TIMEOUT ST (SAFETY) ×1 IMPLANT
EXTRACTOR STONE NITINOL NGAGE (UROLOGICAL SUPPLIES) IMPLANT
GLOVE SURG LX STRL 7.5 STRW (GLOVE) ×1 IMPLANT
GOWN SRG XL LVL 4 BRTHBL STRL (GOWNS) ×1 IMPLANT
GOWN STRL NON-REIN XL LVL4 (GOWNS) ×2
GUIDEWIRE STR DUAL SENSOR (WIRE) IMPLANT
GUIDEWIRE ZIPWRE .038 STRAIGHT (WIRE) ×1 IMPLANT
KIT TURNOVER KIT A (KITS) IMPLANT
LASER FIB FLEXIVA PULSE ID 365 (Laser) IMPLANT
MANIFOLD NEPTUNE II (INSTRUMENTS) ×1 IMPLANT
MAT PREVALON FULL STRYKER (MISCELLANEOUS) IMPLANT
PACK CYSTO (CUSTOM PROCEDURE TRAY) ×1 IMPLANT
SHEATH NAVIGATOR HD 11/13X36 (SHEATH) IMPLANT
STENT URET 6FRX26 CONTOUR (STENTS) IMPLANT
TRACTIP FLEXIVA PULS ID 200XHI (Laser) IMPLANT
TRACTIP FLEXIVA PULSE ID 200 (Laser) ×1
TUBING CONNECTING 10 (TUBING) ×1 IMPLANT
TUBING UROLOGY SET (TUBING) ×1 IMPLANT

## 2022-07-02 NOTE — Anesthesia Preprocedure Evaluation (Signed)
Anesthesia Evaluation  Patient identified by MRN, date of birth, ID band Patient awake    Reviewed: Allergy & Precautions, NPO status , Patient's Chart, lab work & pertinent test results  History of Anesthesia Complications (+) PONV and history of anesthetic complications  Airway Mallampati: II  TM Distance: >3 FB Neck ROM: Full    Dental no notable dental hx. (+) Teeth Intact, Dental Advisory Given   Pulmonary sleep apnea and Continuous Positive Airway Pressure Ventilation , COPD,  oxygen dependent On 4L [place]   Pulmonary exam normal breath sounds clear to auscultation       Cardiovascular pulmonary hypertension+CHF  Normal cardiovascular exam Rhythm:Regular Rate:Normal  TTE 05/2019 1. Left ventricular ejection fraction, by estimation, is 60 to 65%. The  left ventricle has normal function. The left ventricle has no regional  wall motion abnormalities. Left ventricular diastolic parameters were  normal.   2. Right ventricular systolic function is normal. The right ventricular  size is normal.   3. The mitral valve is normal in structure and function. No evidence of  mitral valve regurgitation. No evidence of mitral stenosis.   4. The aortic valve is normal in structure and function. Aortic valve  regurgitation is not visualized. No aortic stenosis is present.   5. The inferior vena cava is normal in size with greater than 50%  respiratory variability, suggesting right atrial pressure of 3 mmHg.      Neuro/Psych  Headaches   Depression       GI/Hepatic hiatal hernia,GERD  ,,  Endo/Other  diabetes  Morbid obesity  Renal/GU Lab Results      Component                Value               Date                      CREATININE               1.00                03/31/2022                BUN                      15                  03/31/2022                NA                       134 (L)             03/31/2022                K                         3.9                 03/31/2022                CL                       95 (L)              03/31/2022  CO2                      26                  03/31/2022                Musculoskeletal  (+) Arthritis ,    Abdominal  (+) + obese  Peds  Hematology Lab Results      Component                Value               Date                      WBC                      11.5 (H)            03/31/2022                HGB                      13.3                03/31/2022                HCT                      39.0                03/31/2022                MCV                      93.6                03/31/2022                PLT                      271                 03/31/2022              Anesthesia Other Findings All Tape  Reproductive/Obstetrics                             Anesthesia Physical Anesthesia Plan  ASA: 3  Anesthesia Plan: General   Post-op Pain Management: Dilaudid IV   Induction: Intravenous  PONV Risk Score and Plan: 3 and Treatment may vary due to age or medical condition, Ondansetron, Dexamethasone and Midazolam  Airway Management Planned: LMA  Additional Equipment: None  Intra-op Plan:   Post-operative Plan: Extubation in OR  Informed Consent: I have reviewed the patients History and Physical, chart, labs and discussed the procedure including the risks, benefits and alternatives for the proposed anesthesia with the patient or authorized representative who has indicated his/her understanding and acceptance.     Dental advisory given  Plan Discussed with: CRNA, Anesthesiologist and Surgeon  Anesthesia Plan Comments:        Anesthesia Quick Evaluation

## 2022-07-02 NOTE — Anesthesia Procedure Notes (Signed)
Procedure Name: LMA Insertion Date/Time: 07/02/2022 10:49 AM  Performed by: Maxwell Caul, CRNAPre-anesthesia Checklist: Patient identified, Emergency Drugs available, Suction available and Patient being monitored Patient Re-evaluated:Patient Re-evaluated prior to induction Oxygen Delivery Method: Circle system utilized Preoxygenation: Pre-oxygenation with 100% oxygen Induction Type: IV induction LMA: LMA with gastric port inserted LMA Size: 5.0 Number of attempts: 1 Placement Confirmation: positive ETCO2 and breath sounds checked- equal and bilateral Tube secured with: Tape Dental Injury: Teeth and Oropharynx as per pre-operative assessment

## 2022-07-02 NOTE — Anesthesia Postprocedure Evaluation (Signed)
Anesthesia Post Note  Patient: [name]  Procedure(s) Performed: CYSTOSCOPY, LEFT STENT REMOVAL, BILATERAL URETEROSCOPY, HOLMIUM LASER, AND RIGHT STENT PLACEMENT (Bilateral)     Patient location during evaluation: PACU Anesthesia Type: General Level of consciousness: awake and alert Pain management: pain level controlled Vital Signs Assessment: post-procedure vital signs reviewed and stable Respiratory status: spontaneous breathing, nonlabored ventilation and respiratory function stable Cardiovascular status: blood pressure returned to baseline and stable Postop Assessment: no apparent nausea or vomiting Anesthetic complications: no   No notable events documented.  Last Vitals:  Vitals:   07/02/22 1245 07/02/22 1315  BP: 134/89 125/79  Pulse: 73 73  Resp: 12 16  Temp:  36.7 C  SpO2: 98% 97%    Last Pain:  Vitals:   07/02/22 1315  TempSrc: Oral  PainSc: 5                  Lynda Rainwater

## 2022-07-02 NOTE — H&P (Signed)
Office Visit Report     06/13/2022   --------------------------------------------------------------------------------   [name]  MRN: [id number]  DOB: 24-Apr-1965, 57 year old Male  PRIMARY CARE:  Antony Contras, MD  PRIMARY CARE FAX:  [phone]  REFERRING:    PROVIDER:  Franchot Gallo, M.D.  TREATING:  Ellison Hughs, M.D.  LOCATION:  [facility] [phone]     --------------------------------------------------------------------------------   CC/HPI: Bilateral kidney stones   [name] is a 57 year old male with multiple medical comorbidities who required urgent left ureteral stent placement in December 2023 due to urosepsis from an obstructing ureteral stone. He was also noted to have bilateral renal stone burden on CT at that time. He presents today for preoperative appointment leading up to definitive stone treatment. He has done well since his last encounter and notes no significant flank pain, dysuria or hematuria.   Recently had echo and cardiac clearance for pain pump re-implantation from Dr. Ricardo Jericho at [place]: No Allergies Tape    MEDICATIONS: Albuterol 90 mcg aerosol prn but hasn't used in years  Alprazolam 0.5 mg tablet Oral  Duloxetine Hcl 30 mg capsule,delayed release  Fiber TABS Oral  Furosemide  K-Y Lubricating Jelly jelly 0 External  Methadone Hcl 5 mg tablet 2 Oral Every twelve hours  MiraLax POWD Oral  Morphine Sulfate ER 60 MG Oral Capsule Extended Release 24 Hour Oral  Neurontin 600 MG Oral Tablet Oral  Nexium 40 mg capsule,delayed release 0 Oral Twice daily  Pregabalin  Promethazine Hcl 25 mg tablet 0 Oral 3 times daily  Tizanidine Hcl 4 mg capsule     GU PSH: None     PSH Notes: Rotator Cuff Repair, Rotator Cuff Repair, Knee Replacement, Arthroscopy Knee Left, Abdominal Surgery, Neurol Drug Infusion Implantation Of Programmable Pump, Back Surgery, Eye Surgery, Knee Surgery Left   NON-GU PSH:  Implant Spine Infusion Pump - 2009 Knee Arthroscopy; Dx - 2010 Revise Knee Joint - 2011     GU PMH: Ureteral calculus - 04/29/2022 ED due to arterial insufficiency - 2019, (Stable), - 2017, Erectile dysfunction due to arterial insufficiency, - 2016 Overactive bladder (Stable), Stable on oxybutynin twice a day - 2019, (Stable), He still uses depends for men-2-3 a day. Symptoms of been stable on oxybutynin., - 2017, Detrusor instability, - 2016 Primary hypogonadism (Worsening), Currently not on treatment, but he has a very low testosterone. I worry about him having long-term bone issues because of this. He is also symptomatic with erectile dysfunction and low libido/energy - 2019, (Worsening), He would like to consider testosterone repletion again-he does not have recent laboratory testing, - 2017, Hypogonadism, testicular, - 2016 Urinary Tract Inf, Unspec site, Pyuria - 2014      PMH Notes: ardiovascular and Mediastinum  Chronic diastolic heart failure (HCC)   Respiratory   Restrictive airway disease   Chronic respiratory failure (HCC)  OSA complicated by mild polycythemia  Mild persistent asthma  Sleep apnea   Digestive   Drug-induced nausea and vomiting   Therapeutic opioid-induced constipation (OIC)  Gastroesophageal reflux disease  Acute alcoholic pancreatitis  Alcohol-induced acute pancreatitis without infection or necrosis  Nausea and vomiting  Irritable bowel syndrome   Endocrine   Type 2 diabetes mellitus with morbid obesity ([place])  Male hypogonadism  Type 2 diabetes mellitus with diabetic polyneuropathy, without long-term current use of insulin (HCC)  Type 2 diabetes mellitus with other specified complication (HCC)   Nervous and Auditory   Chronic low back  pain (Bilateral) w/ sciatica (Bilateral)   Epidural fibrosis   Lumbosacral spondylosis with radiculopathy  Acute metabolic encephalopathy   Musculoskeletal and Integument   DDD (degenerative disc  disease), lumbosacral   Disorder of skeletal system   Old tear of medial meniscus of knee (Left)   Spondylosis without myelopathy or radiculopathy, lumbosacral region   Lumbar spondylosis   Secondary osteoarthritis of multiple sites   Osteoarthritis of hip (Right)   DDD (degenerative disc disease), thoracic   Osteoarthritis of lumbar spine  Candidal intertrigo   Genitourinary  Complicated UTI (urinary tract infection)   Other   Chronic pain syndrome   Long term prescription benzodiazepine use   Long term prescription opiate use   Opiate use (140 MME/day) (oral)   Long-term use of high-risk medication   Pharmacologic therapy   Problems influencing health status   Chronic nausea s/p bariatric surgery   Presence of intrathecal pump ([employer])   Chronic foot pain (Right)   Chronic hip pain (Right)   Failed back surgical syndrome   Chronic lower extremity pain (2ry area of Pain) (Bilateral) (L>R)   Chronic lower extremity radicular pain   Chronic shoulder pain (Bilateral)   Chronic musculoskeletal pain   Muscle spasticity   Neurogenic pain   Chronic knee pain after total replacement (Left)   Lumbar facet syndrome (Bilateral) (R>L)   Chronic low back pain (1ry area of Pain) (Bilateral) (R>L) w/o sciatica   Morbid obesity with BMI of 45.0-49.9, adult (HCC)   Obesity, morbid (more than 100 lbs over ideal weight or BMI > 40) (HCC)   Chronic hyperglycemia   Chronic use of opiate for therapeutic purpose   Weakness of lower extremities (Bilateral)   Weakness of back   Physical deconditioning   Physical tolerance to opiate drug   Unable to ambulate   Displacement of spinal infusion catheter   Genetic testing (CPY450 2D6: Normal activity)   Opioid use disorder  S/P rotator cuff repair (Bilateral)  Dyspnea  Polycythemia, secondary  History of bariatric surgery (September 2016)  Encounter for interrogation of infusion pump  History of  total knee replacement (Left)  History of arthroscopic surgery of shoulder (Bilateral)  Encounter for adjustment or management of infusion pump  Hypokalemia  Hyponatremia  Alcoholism ([place])  Abdominal pain  Encounter for screening colonoscopy  History of acute alcoholic pancreatitis  Vitamin D deficiency  Altered mental status  Fever  Myoclonic jerking  Preoperative testing  Uncomplicated opioid dependence ([place])  Elevated blood-pressure reading, without diagnosis of hypertension  Moderate major depression, single episode ([place])  Pure hypercholesterolemia  Urinary incontinence  Constipation  Malfunction of intrathecal infusion pump (HCC)  End of battery life of intrathecal infusion pump  Abnormal MRI, lumbar spine (11/21/2021)  Sepsis secondary urosepsis/stone     NON-GU PMH: Encounter for general adult medical examination without abnormal findings, Encounter for preventive health examination - 2016 Anal abscess, Perianal abscess - 2014 Asthma, Asthma - 2014 Personal history of other diseases of the digestive system, History of esophageal reflux - 2014 Personal history of other diseases of the nervous system and sense organs, History of sleep apnea - 2014 Tinea cruris, Tinea cruris - 2014    FAMILY HISTORY: Bladder Cancer - Grandmother Diabetes - Grandmother Family Health Status Number - Runs In Family Glaucoma - Grandmother No pertinent family history - Runs In Family   SOCIAL HISTORY: Marital Status: Married Preferred Language: English; Ethnicity: Not Hispanic Or Latino; Race: White Current Smoking Status: Patient has never smoked.  Tobacco Use Assessment Completed: Used Tobacco in last 30 days? Does drink.  Drinks 4+ caffeinated drinks per day.    REVIEW OF SYSTEMS:    GU Review Male:   Patient denies frequent urination, hard to postpone urination, burning/ pain with urination, get up at night to urinate, leakage of urine, stream starts and stops, trouble starting  your stream, have to strain to urinate , erection problems, and penile pain.  Gastrointestinal (Upper):   Patient denies nausea, vomiting, and indigestion/ heartburn.  Gastrointestinal (Lower):   Patient denies diarrhea and constipation.  Constitutional:   Patient denies fever, night sweats, weight loss, and fatigue.  Skin:   Patient denies skin rash/ lesion and itching.  Eyes:   Patient denies blurred vision and double vision.  Ears/ Nose/ Throat:   Patient denies sore throat and sinus problems.  Hematologic/Lymphatic:   Patient denies swollen glands and easy bruising.  Cardiovascular:   Patient denies leg swelling and chest pains.  Respiratory:   Patient denies cough and shortness of breath.  Endocrine:   Patient denies excessive thirst.  Musculoskeletal:   Patient denies back pain and joint pain.  Neurological:   Patient denies headaches and dizziness.  Psychologic:   Patient denies depression and anxiety.   VITAL SIGNS: None   MULTI-SYSTEM PHYSICAL EXAMINATION:    Constitutional: Well-nourished. No physical deformities. Normally developed. Good grooming.  Neurologic / Psychiatric: Oriented to time, oriented to place, oriented to person. No depression, no anxiety, no agitation.  Gastrointestinal: Obese abdomen. No mass, no tenderness, no rigidity.   Musculoskeletal: Wheelchair bound      Complexity of Data:  Records Review:   Previous Hospital Records, Previous Patient Records  Urine Test Review:   Urine Culture  X-Ray Review: C.T. Abdomen/Pelvis: Reviewed Films. Reviewed Report. Discussed With Patient.     07/09/17 02/19/16 10/06/13 09/03/12 05/16/11 03/29/10 03/23/09 09/15/08  PSA  Total PSA 0.071 ng/mL 0.131  0.40  0.20  0.23  0.41  1.01  0.90     02/19/16 02/19/16 07/20/06 03/26/05  Hormones  Testosterone, Total 35.7 pg/dL 35.7 pg/dL 1.75  0.74    Notes:                     CLINICAL DATA: Abdominal pain.   EXAM:  CT ABDOMEN AND PELVIS WITH CONTRAST   TECHNIQUE:   Multidetector CT imaging of the abdomen and pelvis was performed  using the standard protocol following bolus administration of  intravenous contrast.   RADIATION DOSE REDUCTION: This exam was performed according to the  departmental dose-optimization program which includes automated  exposure control, adjustment of the mA and/or kV according to  patient size and/or use of iterative reconstruction technique.   CONTRAST: 18mL OMNIPAQUE IOHEXOL 300 MG/ML SOLN   COMPARISON: CT abdomen and pelvis 06/10/2018   FINDINGS:  Lower chest: No acute abnormality.   Hepatobiliary: The liver is enlarged. There is diffuse fatty  infiltration of the liver. The gallbladder bile ducts are within  normal limits.   Pancreas: Unremarkable. No pancreatic ductal dilatation or  surrounding inflammatory changes.   Spleen: Normal in size without focal abnormality.   Adrenals/Urinary Tract: There is a 6 mm calculus in the distal left  ureter. There is mild left-sided hydroureteronephrosis and mild left  perinephric fat stranding. There are additional nonobstructing  bilateral renal calculi; the largest is in the right kidney  measuring 11 mm. There is no right-sided hydronephrosis.   There is mild bladder wall thickening with surrounding  inflammation.  There is also mild inflammatory stranding surrounding the bilateral  distal ureters.   The adrenal glands are within normal limits.   Stomach/Bowel: Stomach is within normal limits. No evidence of bowel  wall thickening, distention, or inflammatory changes. There are  postsurgical changes in the stomach. Appendix is not visualized.   Vascular/Lymphatic: No significant vascular findings are present. No  enlarged abdominal or pelvic lymph nodes.   Reproductive: Prostate is unremarkable.   Other: No abdominal wall hernia or abnormality. No abdominopelvic  ascites.   Musculoskeletal: Postsurgical changes/disc spaces are seen at L3-L4  and L4-L5.  thoracic spinal cord stimulator device again seen.   IMPRESSION:  1. 6 mm calculus in the distal left ureter with mild obstructive  uropathy.  2. Additional nonobstructing bilateral renal calculi.  3. Mild bladder wall thickening with surrounding inflammation  concerning for cystitis. There is also mild inflammatory stranding  surrounding the distal ureters bilaterally which may be related to  infection.  4. Hepatomegaly and hepatic steatosis.    Electronically Signed  By: Ronney Asters M.D.  On: 03/31/2022 20:59     PROCEDURES: None   ASSESSMENT:      ICD-10 Details  1 GU:   Renal and ureteral calculus - N20.2 Chronic, Stable   PLAN:           Schedule Return Visit/Planned Activity: Next Available Appointment - Schedule Surgery          Document Letter(s):  Created for Patient: Clinical Summary         Notes:   The risks, benefits and alternatives of cystoscopy with BILATERAL ureteroscopy, laser lithotripsy and ureteral stent placement was discussed the patient. Risks included, but are not limited to: bleeding, urinary tract infection, ureteral injury/avulsion, ureteral stricture formation, retained stone fragments, the possibility that multiple surgeries may be required to treat the stone(s), MI, stroke, PE and the inherent risks of general anesthesia. The patient voices understanding and wishes to proceed.

## 2022-07-02 NOTE — Transfer of Care (Signed)
Immediate Anesthesia Transfer of Care Note  Patient: [name]  Procedure(s) Performed: CYSTOSCOPY, LEFT STENT REMOVAL, BILATERAL URETEROSCOPY, HOLMIUM LASER, AND RIGHT STENT PLACEMENT (Bilateral)  Patient Location: PACU  Anesthesia Type:General  Level of Consciousness: awake, alert , and oriented  Airway & Oxygen Therapy: Patient Spontanous Breathing and Patient connected to face mask oxygen  Post-op Assessment: Report given to RN and Post -op Vital signs reviewed and stable  Post vital signs: Reviewed and stable  Last Vitals:  Vitals Value Taken Time  BP 116/80 07/02/22 1216  Temp    Pulse 77 07/02/22 1217  Resp 23 07/02/22 1217  SpO2 100 % 07/02/22 1217  Vitals shown include unvalidated device data.  Last Pain:  Vitals:   07/02/22 0911  TempSrc: Oral         Complications: No notable events documented.

## 2022-07-02 NOTE — Op Note (Signed)
Operative Note  Preoperative diagnosis:  1.  Left ureteral stone 2.  Bilateral renal stones  Postoperative diagnosis: Same  Procedure(s): 1.  Cystoscopy with bilateral ureteroscopy, basket stone extraction of left ureteral stone and holmium laser lithotripsy of right renal stones 2.  Left ureteral stent removal 3.  Right ureteral stent placement (with tether) 4.  Bilateral retrograde pyelograms with intraoperative interpretation of fluoroscopic imaging  Surgeon: Ellison Hughs, MD  Assistants:  None  Anesthesia:  General  Complications:  None  EBL: Less than 5 mL  Specimens: 1.  Left ureteral stone 2.  Previously placed left ureteral stent was removed intact, inspected and discarded  Drains/Catheters: 1.  Right 6 French, 26 cm JJ stent with tether  Intraoperative findings:   Left ureteral stone was seen at the left UO and easily extracted with a 0 tip basket.  No appreciable stone burden within the left renal pelvis. Solitary left collecting system with no filling defects or dilation involving the left ureter or left renal pelvis seen on retrograde pyelogram Solitary right collecting system with no filling defects or dilation involving the right ureter or right renal pelvis seen on retrograde pyelogram Multiple 5 to 10 mm right renal stones  Indication:  [name] is a 57 y.o. male with a history of kidney stones.  He required urgent left ureteral stent placement in December 2023 due to urosepsis from an obstructing left ureteral stone.  He is here today to address [name] bilateral stone burden.  He has been consented for the above procedures, voices understanding and wishes to proceed.  Description of procedure:  After informed consent was obtained, the patient was brought to the operating room and general LMA anesthesia was administered. The patient was then placed in the dorsolithotomy position and prepped and draped in the usual sterile fashion. A timeout was  performed. A 23 French rigid cystoscope was then inserted into the urethral meatus and advanced into the bladder under direct vision. A complete bladder survey revealed no intravesical pathology.  [name] previously placed left ureteral stent was grasped at its distal curl and removed intact.  A 5 French ureteral catheter was then inserted into the left ureteral orifice and a retrograde pyelogram was obtained, with the findings listed above.  A Glidewire was then used to intubate the lumen of the ureteral catheter and was advanced up to the left renal pelvis, under fluoroscopic guidance.  The catheter was then removed, leaving the wire in place.  The stone was found to be protruding from the left ureteral orifice.  A 0 tip basket was then used to atraumatically extract the stone.  The flexible ureteroscope was then advanced alongside the wire and up to the left renal pelvis where no appreciable stone burden was identified.  Due to the atraumatic nature of [name] ureteral stone extraction, I made the decision not to place a ureteral stent.  A 5 French ureteral catheter was then inserted into the right ureteral orifice and a retrograde pyelogram was obtained, with the findings listed above.  A Glidewire was then used to intubate the lumen of the ureteral catheter and was advanced up to the right renal pelvis, under fluoroscopic guidance.  The catheter was then removed, leaving the wire in place.  A flexible ureteroscope was then advanced alongside the wire and up to the right renal pelvis.  Numerous 5 to 10 mm stones were identified within the upper urinary tract.  A 200 m holmium laser was then used to dust the  stones into 2 mm or less fragments.  The flexible ureteroscope was then removed under direct vision, identifying no critical ureteral stone burden or evidence of trauma.  A 6 French, 26 cm JJ stent was then placed over the wire and into the position within the right collecting system, confirming placement  via fluoroscopy.  The tether the stent was left intact and secured to the dorsum of the penis with Steri-Strips and benzoin.  The patient's bladder was drained.  He tolerated the procedure well and was transferred to the postanesthesia in stable condition.  Plan: The patient has been instructed to remove [name] ureteral stent in 1 week and follow-up in 6 weeks for renal ultrasound.

## 2022-07-03 ENCOUNTER — Encounter (HOSPITAL_COMMUNITY): Payer: Self-pay | Admitting: Urology

## 2022-08-04 ENCOUNTER — Telehealth: Payer: Self-pay | Admitting: Primary Care

## 2022-08-04 NOTE — Telephone Encounter (Signed)
Received a call from Renaldo Reel, case manager for pt who is working with him due to workers' comp. She stated after pt's visit with [facility] on 4/24, the office visit notes need to be sent to her at fax number [phone].  Routing to [employer] as an FYI so she can have pt's OV notes sent to provided fax after pt's visit.

## 2022-08-05 ENCOUNTER — Telehealth: Payer: Self-pay

## 2022-08-05 NOTE — Telephone Encounter (Signed)
Attempted to call patient.  Patient has a surgical clearance OV with Ames Dura, NP tomorrow, 08/06/2022 at 3:30 pm.  Left message for patient to call back regarding if he is currently using a CPAP as we need a CPAP compliance report for his visit tomorrow.  Unable to locate in [place].

## 2022-08-06 ENCOUNTER — Ambulatory Visit: Payer: Self-pay | Admitting: Primary Care

## 2022-08-06 NOTE — Telephone Encounter (Signed)
Spoke with patient he advises he hasn't used cpap machine in years. Pt had to re-schedule apt today till May 7th. Routing to [employer] as an Financial planner

## 2022-08-11 DIAGNOSIS — M545 Low back pain, unspecified: Secondary | ICD-10-CM | POA: Diagnosis not present

## 2022-08-11 DIAGNOSIS — I5032 Chronic diastolic (congestive) heart failure: Secondary | ICD-10-CM | POA: Diagnosis not present

## 2022-08-11 DIAGNOSIS — M549 Dorsalgia, unspecified: Secondary | ICD-10-CM | POA: Diagnosis not present

## 2022-08-11 DIAGNOSIS — L03115 Cellulitis of right lower limb: Secondary | ICD-10-CM | POA: Diagnosis not present

## 2022-08-11 DIAGNOSIS — G8911 Acute pain due to trauma: Secondary | ICD-10-CM | POA: Diagnosis not present

## 2022-08-11 DIAGNOSIS — A419 Sepsis, unspecified organism: Secondary | ICD-10-CM | POA: Diagnosis not present

## 2022-08-11 DIAGNOSIS — R7303 Prediabetes: Secondary | ICD-10-CM | POA: Diagnosis not present

## 2022-08-11 DIAGNOSIS — E662 Morbid (severe) obesity with alveolar hypoventilation: Secondary | ICD-10-CM | POA: Diagnosis not present

## 2022-08-11 DIAGNOSIS — Z6841 Body Mass Index (BMI) 40.0 and over, adult: Secondary | ICD-10-CM | POA: Diagnosis not present

## 2022-08-11 DIAGNOSIS — M5459 Other low back pain: Secondary | ICD-10-CM | POA: Diagnosis not present

## 2022-08-11 DIAGNOSIS — M25551 Pain in right hip: Secondary | ICD-10-CM | POA: Diagnosis not present

## 2022-08-11 DIAGNOSIS — J9611 Chronic respiratory failure with hypoxia: Secondary | ICD-10-CM | POA: Diagnosis not present

## 2022-08-11 DIAGNOSIS — L03317 Cellulitis of buttock: Secondary | ICD-10-CM | POA: Diagnosis not present

## 2022-08-11 DIAGNOSIS — L304 Erythema intertrigo: Secondary | ICD-10-CM | POA: Diagnosis not present

## 2022-08-19 ENCOUNTER — Ambulatory Visit: Payer: Self-pay | Admitting: Primary Care

## 2022-08-22 ENCOUNTER — Inpatient Hospital Stay (HOSPITAL_COMMUNITY)
Admission: EM | Admit: 2022-08-22 | Discharge: 2022-08-25 | DRG: 602 | Disposition: A | Discharge status: Home Health | Payer: Medicare HMO | Attending: Internal Medicine | Admitting: Internal Medicine

## 2022-08-22 ENCOUNTER — Encounter (HOSPITAL_COMMUNITY): Payer: Self-pay | Admitting: Emergency Medicine

## 2022-08-22 ENCOUNTER — Emergency Department (HOSPITAL_COMMUNITY): Payer: Medicare HMO

## 2022-08-22 ENCOUNTER — Other Ambulatory Visit: Payer: Self-pay

## 2022-08-22 DIAGNOSIS — Z993 Dependence on wheelchair: Secondary | ICD-10-CM

## 2022-08-22 DIAGNOSIS — G8929 Other chronic pain: Secondary | ICD-10-CM | POA: Diagnosis present

## 2022-08-22 DIAGNOSIS — L03317 Cellulitis of buttock: Secondary | ICD-10-CM | POA: Diagnosis not present

## 2022-08-22 DIAGNOSIS — L304 Erythema intertrigo: Secondary | ICD-10-CM | POA: Diagnosis present

## 2022-08-22 DIAGNOSIS — R0902 Hypoxemia: Secondary | ICD-10-CM | POA: Diagnosis not present

## 2022-08-22 DIAGNOSIS — Z96652 Presence of left artificial knee joint: Secondary | ICD-10-CM | POA: Diagnosis present

## 2022-08-22 DIAGNOSIS — E1169 Type 2 diabetes mellitus with other specified complication: Secondary | ICD-10-CM | POA: Diagnosis present

## 2022-08-22 DIAGNOSIS — E861 Hypovolemia: Secondary | ICD-10-CM | POA: Diagnosis present

## 2022-08-22 DIAGNOSIS — E872 Acidosis, unspecified: Secondary | ICD-10-CM | POA: Diagnosis present

## 2022-08-22 DIAGNOSIS — R4781 Slurred speech: Secondary | ICD-10-CM | POA: Diagnosis not present

## 2022-08-22 DIAGNOSIS — G9341 Metabolic encephalopathy: Secondary | ICD-10-CM | POA: Diagnosis not present

## 2022-08-22 DIAGNOSIS — I5032 Chronic diastolic (congestive) heart failure: Secondary | ICD-10-CM | POA: Diagnosis not present

## 2022-08-22 DIAGNOSIS — Z7982 Long term (current) use of aspirin: Secondary | ICD-10-CM

## 2022-08-22 DIAGNOSIS — Z9884 Bariatric surgery status: Secondary | ICD-10-CM | POA: Diagnosis not present

## 2022-08-22 DIAGNOSIS — K8020 Calculus of gallbladder without cholecystitis without obstruction: Secondary | ICD-10-CM | POA: Diagnosis not present

## 2022-08-22 DIAGNOSIS — K219 Gastro-esophageal reflux disease without esophagitis: Secondary | ICD-10-CM | POA: Diagnosis present

## 2022-08-22 DIAGNOSIS — Z0389 Encounter for observation for other suspected diseases and conditions ruled out: Secondary | ICD-10-CM | POA: Diagnosis not present

## 2022-08-22 DIAGNOSIS — N200 Calculus of kidney: Secondary | ICD-10-CM | POA: Diagnosis not present

## 2022-08-22 DIAGNOSIS — Z6841 Body Mass Index (BMI) 40.0 and over, adult: Secondary | ICD-10-CM

## 2022-08-22 DIAGNOSIS — Z833 Family history of diabetes mellitus: Secondary | ICD-10-CM | POA: Diagnosis not present

## 2022-08-22 DIAGNOSIS — E119 Type 2 diabetes mellitus without complications: Secondary | ICD-10-CM | POA: Diagnosis not present

## 2022-08-22 DIAGNOSIS — I10 Essential (primary) hypertension: Secondary | ICD-10-CM | POA: Diagnosis not present

## 2022-08-22 DIAGNOSIS — A419 Sepsis, unspecified organism: Secondary | ICD-10-CM | POA: Insufficient documentation

## 2022-08-22 DIAGNOSIS — E871 Hypo-osmolality and hyponatremia: Secondary | ICD-10-CM | POA: Diagnosis not present

## 2022-08-22 DIAGNOSIS — Z79899 Other long term (current) drug therapy: Secondary | ICD-10-CM | POA: Diagnosis not present

## 2022-08-22 DIAGNOSIS — L03312 Cellulitis of back [any part except buttock]: Principal | ICD-10-CM | POA: Diagnosis present

## 2022-08-22 DIAGNOSIS — R4182 Altered mental status, unspecified: Principal | ICD-10-CM

## 2022-08-22 DIAGNOSIS — J4489 Other specified chronic obstructive pulmonary disease: Secondary | ICD-10-CM | POA: Diagnosis present

## 2022-08-22 DIAGNOSIS — Z720 Tobacco use: Secondary | ICD-10-CM

## 2022-08-22 DIAGNOSIS — R0689 Other abnormalities of breathing: Secondary | ICD-10-CM | POA: Diagnosis not present

## 2022-08-22 DIAGNOSIS — G4730 Sleep apnea, unspecified: Secondary | ICD-10-CM | POA: Diagnosis present

## 2022-08-22 DIAGNOSIS — Z91048 Other nonmedicinal substance allergy status: Secondary | ICD-10-CM

## 2022-08-22 DIAGNOSIS — L0390 Cellulitis, unspecified: Secondary | ICD-10-CM | POA: Diagnosis present

## 2022-08-22 DIAGNOSIS — Z7401 Bed confinement status: Secondary | ICD-10-CM | POA: Diagnosis not present

## 2022-08-22 DIAGNOSIS — Z79891 Long term (current) use of opiate analgesic: Secondary | ICD-10-CM

## 2022-08-22 DIAGNOSIS — R000 Tachycardia, unspecified: Secondary | ICD-10-CM | POA: Diagnosis not present

## 2022-08-22 DIAGNOSIS — N3289 Other specified disorders of bladder: Secondary | ICD-10-CM | POA: Diagnosis not present

## 2022-08-22 NOTE — ED Triage Notes (Signed)
Pt bib [facility] from home for altered mental status. Pt's wife reports that at approx 9pm pt suddenly began talking about "seeing kids in the house" when there was no children in the house. EMS reports patient is a&ox1. Pt admits to drinking 10 beers tonight and taking prescribed narcotic medication at 5pm. EMS reports slurred speech but no weakness or aphasia. Pt hypotensive in the 60's. ns given PTA.   HR 60's, RR 10, CBG 172

## 2022-08-23 ENCOUNTER — Emergency Department (HOSPITAL_COMMUNITY): Payer: Medicare HMO

## 2022-08-23 DIAGNOSIS — K219 Gastro-esophageal reflux disease without esophagitis: Secondary | ICD-10-CM | POA: Diagnosis present

## 2022-08-23 DIAGNOSIS — Z6841 Body Mass Index (BMI) 40.0 and over, adult: Secondary | ICD-10-CM | POA: Diagnosis not present

## 2022-08-23 DIAGNOSIS — R4781 Slurred speech: Secondary | ICD-10-CM | POA: Diagnosis present

## 2022-08-23 DIAGNOSIS — G8929 Other chronic pain: Secondary | ICD-10-CM | POA: Diagnosis present

## 2022-08-23 DIAGNOSIS — E119 Type 2 diabetes mellitus without complications: Secondary | ICD-10-CM | POA: Diagnosis present

## 2022-08-23 DIAGNOSIS — J4489 Other specified chronic obstructive pulmonary disease: Secondary | ICD-10-CM | POA: Diagnosis present

## 2022-08-23 DIAGNOSIS — E872 Acidosis, unspecified: Secondary | ICD-10-CM | POA: Diagnosis present

## 2022-08-23 DIAGNOSIS — G4730 Sleep apnea, unspecified: Secondary | ICD-10-CM | POA: Diagnosis present

## 2022-08-23 DIAGNOSIS — L0390 Cellulitis, unspecified: Secondary | ICD-10-CM | POA: Diagnosis present

## 2022-08-23 DIAGNOSIS — E871 Hypo-osmolality and hyponatremia: Secondary | ICD-10-CM | POA: Diagnosis present

## 2022-08-23 DIAGNOSIS — Z96652 Presence of left artificial knee joint: Secondary | ICD-10-CM | POA: Diagnosis present

## 2022-08-23 DIAGNOSIS — L03317 Cellulitis of buttock: Secondary | ICD-10-CM | POA: Diagnosis present

## 2022-08-23 DIAGNOSIS — E861 Hypovolemia: Secondary | ICD-10-CM | POA: Diagnosis present

## 2022-08-23 DIAGNOSIS — G9341 Metabolic encephalopathy: Secondary | ICD-10-CM | POA: Diagnosis present

## 2022-08-23 DIAGNOSIS — L304 Erythema intertrigo: Secondary | ICD-10-CM | POA: Diagnosis present

## 2022-08-23 DIAGNOSIS — I5032 Chronic diastolic (congestive) heart failure: Secondary | ICD-10-CM | POA: Diagnosis present

## 2022-08-23 DIAGNOSIS — Z720 Tobacco use: Secondary | ICD-10-CM | POA: Diagnosis not present

## 2022-08-23 DIAGNOSIS — Z91048 Other nonmedicinal substance allergy status: Secondary | ICD-10-CM | POA: Diagnosis not present

## 2022-08-23 DIAGNOSIS — Z9884 Bariatric surgery status: Secondary | ICD-10-CM | POA: Diagnosis not present

## 2022-08-23 DIAGNOSIS — Z7401 Bed confinement status: Secondary | ICD-10-CM | POA: Diagnosis not present

## 2022-08-23 DIAGNOSIS — A419 Sepsis, unspecified organism: Secondary | ICD-10-CM | POA: Diagnosis not present

## 2022-08-23 DIAGNOSIS — Z833 Family history of diabetes mellitus: Secondary | ICD-10-CM | POA: Diagnosis not present

## 2022-08-23 DIAGNOSIS — Z79899 Other long term (current) drug therapy: Secondary | ICD-10-CM | POA: Diagnosis not present

## 2022-08-23 DIAGNOSIS — Z993 Dependence on wheelchair: Secondary | ICD-10-CM | POA: Diagnosis not present

## 2022-08-23 DIAGNOSIS — L03312 Cellulitis of back [any part except buttock]: Secondary | ICD-10-CM | POA: Diagnosis present

## 2022-08-23 LAB — COMPREHENSIVE METABOLIC PANEL
ALT: 21 U/L (ref 0–44)
AST: 22 U/L (ref 15–41)
Albumin: 3.2 g/dL — ABNORMAL LOW (ref 3.5–5.0)
Alkaline Phosphatase: 65 U/L (ref 38–126)
Anion gap: 11 (ref 5–15)
BUN: 12 mg/dL (ref 6–20)
CO2: 24 mmol/L (ref 22–32)
Calcium: 8.4 mg/dL — ABNORMAL LOW (ref 8.9–10.3)
Chloride: 99 mmol/L (ref 98–111)
Creatinine, Ser: 0.76 mg/dL (ref 0.61–1.24)
GFR, Estimated: >60 mL/min (ref >60)
Glucose, Bld: 142 mg/dL — ABNORMAL HIGH (ref 70–99)
Potassium: 4.7 mmol/L (ref 3.5–5.1)
Sodium: 134 mmol/L — ABNORMAL LOW (ref 135–145)
Total Bilirubin: 0.4 mg/dL (ref 0.3–1.2)
Total Protein: 7.5 g/dL (ref 6.5–8.1)

## 2022-08-23 LAB — URINALYSIS, W/ REFLEX TO CULTURE (INFECTION SUSPECTED)
Bilirubin Urine: NEGATIVE
Glucose, UA: NEGATIVE mg/dL
Hgb urine dipstick: NEGATIVE
Ketones, ur: NEGATIVE mg/dL
Nitrite: NEGATIVE
Protein, ur: NEGATIVE mg/dL
Specific Gravity, Urine: 1.01 (ref 1.005–1.030)
pH: 5 (ref 5.0–8.0)

## 2022-08-23 LAB — CBC WITH DIFFERENTIAL/PLATELET
Abs Immature Granulocytes: 0.05 10*3/uL (ref 0.00–0.07)
Basophils Absolute: 0.1 10*3/uL (ref 0.0–0.1)
Basophils Relative: 0 %
Eosinophils Absolute: 0.3 10*3/uL (ref 0.0–0.5)
Eosinophils Relative: 2 %
HCT: 38.6 % — ABNORMAL LOW (ref 39.0–52.0)
Hemoglobin: 11.5 g/dL — ABNORMAL LOW (ref 13.0–17.0)
Immature Granulocytes: 0 %
Lymphocytes Relative: 17 %
Lymphs Abs: 2.3 10*3/uL (ref 0.7–4.0)
MCH: 28.4 pg (ref 26.0–34.0)
MCHC: 29.8 g/dL — ABNORMAL LOW (ref 30.0–36.0)
MCV: 95.3 fL (ref 80.0–100.0)
Monocytes Absolute: 0.6 10*3/uL (ref 0.1–1.0)
Monocytes Relative: 4 %
Neutro Abs: 10.1 10*3/uL — ABNORMAL HIGH (ref 1.7–7.7)
Neutrophils Relative %: 77 %
Platelets: 259 10*3/uL (ref 150–400)
RBC: 4.05 MIL/uL — ABNORMAL LOW (ref 4.22–5.81)
RDW: 15 % (ref 11.5–15.5)
WBC: 13.3 10*3/uL — ABNORMAL HIGH (ref 4.0–10.5)
nRBC: 0 % (ref 0.0–0.2)

## 2022-08-23 LAB — RAPID URINE DRUG SCREEN, HOSP PERFORMED
Amphetamines: NOT DETECTED
Barbiturates: NOT DETECTED
Benzodiazepines: NOT DETECTED
Cocaine: NOT DETECTED
Opiates: POSITIVE — AB
Tetrahydrocannabinol: NOT DETECTED

## 2022-08-23 LAB — CBG MONITORING, ED: Glucose-Capillary: 100 mg/dL — ABNORMAL HIGH (ref 70–99)

## 2022-08-23 LAB — PROTIME-INR
INR: 1 (ref 0.8–1.2)
Prothrombin Time: 13.1 seconds (ref 11.4–15.2)

## 2022-08-23 LAB — GLUCOSE, CAPILLARY
Glucose-Capillary: 97 mg/dL (ref 70–99)
Glucose-Capillary: 130 mg/dL — ABNORMAL HIGH (ref 70–99)
Glucose-Capillary: 112 mg/dL — ABNORMAL HIGH (ref 70–99)

## 2022-08-23 LAB — LACTIC ACID, PLASMA
Lactic Acid, Venous: 2 mmol/L (ref 0.5–1.9)
Lactic Acid, Venous: 2.7 mmol/L (ref 0.5–1.9)

## 2022-08-23 LAB — CULTURE, BLOOD (ROUTINE X 2): Culture: NO GROWTH

## 2022-08-23 LAB — APTT: aPTT: 26 seconds (ref 24–36)

## 2022-08-23 LAB — ETHANOL: Alcohol, Ethyl (B): <10 mg/dL (ref <10)

## 2022-08-23 MED ORDER — LACTATED RINGERS IV BOLUS (SEPSIS)
400.0000 mL | Freq: Once | INTRAVENOUS | Status: AC
  Administered 2022-08-23: 400 mL via INTRAVENOUS

## 2022-08-23 MED ORDER — ACETAMINOPHEN 650 MG RE SUPP: 650.0000 mg | Freq: Four times a day (QID) | RECTAL | Status: DC | PRN

## 2022-08-23 MED ORDER — SODIUM CHLORIDE 0.9 % IV SOLN
2.0000 g | INTRAVENOUS | Status: DC
  Administered 2022-08-23 – 2022-08-24 (×2): 2 g via INTRAVENOUS
  Filled 2022-08-23 (×2): qty 20

## 2022-08-23 MED ORDER — INSULIN ASPART 100 UNIT/ML IJ SOLN
0.0000 [IU] | Freq: Every day | INTRAMUSCULAR | Status: DC
  Filled 2022-08-23: qty 0.05

## 2022-08-23 MED ORDER — SODIUM CHLORIDE 0.9 % IV SOLN: 1.0000 g | INTRAVENOUS | Status: DC

## 2022-08-23 MED ORDER — ACETAMINOPHEN 325 MG PO TABS: 650.0000 mg | ORAL_TABLET | Freq: Four times a day (QID) | ORAL | Status: DC | PRN

## 2022-08-23 MED ORDER — SODIUM CHLORIDE 0.9 % IV SOLN
2.0000 g | Freq: Once | INTRAVENOUS | Status: AC
  Administered 2022-08-23: 2 g via INTRAVENOUS
  Filled 2022-08-23: qty 20

## 2022-08-23 MED ORDER — ALBUTEROL SULFATE (2.5 MG/3ML) 0.083% IN NEBU: 2.5000 mg | INHALATION_SOLUTION | RESPIRATORY_TRACT | Status: DC | PRN

## 2022-08-23 MED ORDER — METOPROLOL TARTRATE 5 MG/5ML IV SOLN: 5.0000 mg | Freq: Four times a day (QID) | INTRAVENOUS | Status: DC | PRN

## 2022-08-23 MED ORDER — ENOXAPARIN SODIUM 80 MG/0.8ML IJ SOSY
0.5000 mg/kg | PREFILLED_SYRINGE | INTRAMUSCULAR | Status: DC
  Administered 2022-08-23 – 2022-08-24 (×2): 80 mg via SUBCUTANEOUS
  Filled 2022-08-23 (×3): qty 0.8

## 2022-08-23 MED ORDER — VANCOMYCIN HCL IN DEXTROSE 1-5 GM/200ML-% IV SOLN
1000.0000 mg | Freq: Once | INTRAVENOUS | Status: DC
  Filled 2022-08-23: qty 200

## 2022-08-23 MED ORDER — INSULIN ASPART 100 UNIT/ML IJ SOLN
0.0000 [IU] | Freq: Three times a day (TID) | INTRAMUSCULAR | Status: DC
  Administered 2022-08-23: 2 [IU] via SUBCUTANEOUS
  Filled 2022-08-23: qty 0.15

## 2022-08-23 MED ORDER — ONDANSETRON HCL 4 MG/2ML IJ SOLN: 4.0000 mg | Freq: Four times a day (QID) | INTRAMUSCULAR | Status: DC | PRN

## 2022-08-23 MED ORDER — LACTATED RINGERS IV SOLN: INTRAVENOUS | Status: AC

## 2022-08-23 MED ORDER — TRAZODONE HCL 50 MG PO TABS
25.0000 mg | ORAL_TABLET | Freq: Every evening | ORAL | Status: DC | PRN
  Administered 2022-08-24: 25 mg via ORAL
  Filled 2022-08-23: qty 1

## 2022-08-23 MED ORDER — LACTATED RINGERS IV BOLUS (SEPSIS)
1000.0000 mL | Freq: Once | INTRAVENOUS | Status: AC
  Administered 2022-08-23: 1000 mL via INTRAVENOUS

## 2022-08-23 MED ORDER — VANCOMYCIN HCL 2000 MG/400ML IV SOLN
2000.0000 mg | Freq: Once | INTRAVENOUS | Status: AC
  Administered 2022-08-23: 2000 mg via INTRAVENOUS
  Filled 2022-08-23: qty 400

## 2022-08-23 MED ORDER — VANCOMYCIN HCL 2000 MG/400ML IV SOLN
2000.0000 mg | Freq: Two times a day (BID) | INTRAVENOUS | Status: DC
  Administered 2022-08-23 (×2): 2000 mg via INTRAVENOUS
  Filled 2022-08-23 (×3): qty 400

## 2022-08-23 MED ORDER — ONDANSETRON HCL 4 MG PO TABS: 4.0000 mg | ORAL_TABLET | Freq: Four times a day (QID) | ORAL | Status: DC | PRN

## 2022-08-23 MED ORDER — OXYCODONE HCL 5 MG PO TABS
10.0000 mg | ORAL_TABLET | Freq: Four times a day (QID) | ORAL | Status: DC | PRN
  Administered 2022-08-23 – 2022-08-24 (×3): 10 mg via ORAL
  Filled 2022-08-23 (×3): qty 2

## 2022-08-23 NOTE — Progress Notes (Signed)
Notified bedside nurse of need to draw repeat lactic acid. 

## 2022-08-23 NOTE — Progress Notes (Signed)
Elink monitoring for the code sepsis protocol.  

## 2022-08-23 NOTE — ED Notes (Signed)
ED TO INPATIENT HANDOFF REPORT  ED Nurse Name and Phone #: Tommye Standard Name/Age/Gender [name] 57 y.o. male Room/Bed: WA23/WA23  Code Status   Code Status: Full Code  Home/SNF/Other Home Patient oriented to: self, place, time, and situation Is this baseline? Yes   Triage Complete: Triage complete  Chief Complaint Cellulitis [L03.90]  Triage Note Pt bib [facility] from home for altered mental status. Pt's wife reports that at approx 9pm pt suddenly began talking about "seeing kids in the house" when there was no children in the house. EMS reports patient is a&ox1. Pt admits to drinking 10 beers tonight and taking prescribed narcotic medication at 5pm. EMS reports slurred speech but no weakness or aphasia. Pt hypotensive in the 60's. ns given PTA.   HR 60's, RR 10, CBG 172   Allergies Allergies  Allergen Reactions   Tape Other (See Comments)    Blisters - tolerates paper tape well    Level of Care/Admitting Diagnosis ED Disposition     ED Disposition  Admit   Condition  --   Comment  Hospital Area: [facility] [[id number]]  Level of Care: Med-Surg [16]  May admit patient to [facility] or [facility] if equivalent level of care is available:: Yes  Covid Evaluation: Asymptomatic - no recent exposure (last 10 days) testing not required  Diagnosis: Cellulitis [192319]  Admitting Physician: Maryln Gottron [[id number]]  Attending Physician: [facility], MIR [email or web address] [[id number]]  Certification:: I certify this patient will need inpatient services for at least 2 midnights  Estimated Length of Stay: 2          B Medical/Surgery History Past Medical History:  Diagnosis Date   Arthritis    Asthma    as child   Back pain    CHF (congestive heart failure) (HCC)    COPD (chronic obstructive pulmonary disease) (HCC)    GERD (gastroesophageal reflux disease)    Headache    Hernia    History of hiatal hernia    History of kidney stones    Hypogonadism  in male    Neuromuscular disorder (HCC)    back injury with surgery   On home oxygen therapy    PONV (postoperative nausea and vomiting)    gets nausea   Pre-diabetes    Shortness of breath dyspnea    Sleep apnea    Does not use CPAP, new equipment 12/2014   Ulcer    Wears glasses    Past Surgical History:  Procedure Laterality Date   BACK SURGERY     CYSTOSCOPY W/ URETERAL STENT PLACEMENT Left 03/31/2022   Procedure: CYSTOSCOPY WITH RETROGRADE PYELOGRAM/URETERAL STENT PLACEMENT;  Surgeon: Rene Paci, MD;  Location: [facility];  Service: Urology;  Laterality: Left;   CYSTOSCOPY/URETEROSCOPY/HOLMIUM LASER/STENT PLACEMENT Bilateral 07/02/2022   Procedure: CYSTOSCOPY, LEFT STENT REMOVAL, BILATERAL URETEROSCOPY, HOLMIUM LASER, AND RIGHT STENT PLACEMENT;  Surgeon: Rene Paci, MD;  Location: [facility];  Service: Urology;  Laterality: Bilateral;  90 MINUTES   JOINT REPLACEMENT     Left knee replacement   LAPAROSCOPIC GASTRIC SLEEVE RESECTION N/A 01/08/2015   Procedure: LAPAROSCOPIC GASTRIC SLEEVE RESECTION;  Surgeon: Luretha Murphy, MD;  Location: [facility];  Service: General;  Laterality: N/A;   morphine pump     RIGHT HEART CATH N/A 09/22/2019   Procedure: RIGHT HEART CATH;  Surgeon: Dolores Patty, MD;  Location: [facility];  Service: Cardiovascular;  Laterality: N/A;   SHOULDER ARTHROSCOPY WITH SUBACROMIAL  DECOMPRESSION AND BICEP TENDON REPAIR Left 10/06/2013   Procedure: LEFT SHOULDER ARTHROSCOPY DEBRIDEMENT EXTENTSIVE,DISTAL CLAVICULECTOMY,DECOMPRESSION SUBACROMIAL PARTIAL ACROMIOPLASTY WITH ROTATOR CUFF REPAIR, and excision of CALCIUM DEPOSIT;  Surgeon: Loreta Ave, MD;  Location: [facility];  Service: Orthopedics;  Laterality: Left;   SHOULDER ARTHROSCOPY WITH SUBACROMIAL DECOMPRESSION, ROTATOR CUFF REPAIR AND BICEP TENDON REPAIR Right 08/18/2013   Procedure: RIGHT SHOULDER ARTHROSCOPY WITH SUBACROMIAL DECOMPRESSION/PARTIAL ACROMIOPLASTY  WITH CORACOACROMIAL RELEASE/DISTAL CLAVICULECTOMY/ ROTATOR CUFF REPAIR/DEBRIDEMENT EXTENTSIVE;  Surgeon: Loreta Ave, MD;  Location: [facility];  Service: Orthopedics;  Laterality: Right;  ANESTHESIA: GENERAL, PRE/POST OP SCALENE   TONSILLECTOMY       A IV Location/Drains/Wounds Patient Lines/Drains/Airways Status     Active Line/Drains/Airways     Name Placement date Placement time Site Days   Peripheral IV 08/22/22 18 G Left Antecubital 08/22/22  --  Antecubital  1   Peripheral IV 08/22/22 20 G 1" Left Hand 08/22/22  2348  Hand  1   Ureteral Drain/Stent Right ureter 6 Fr. 07/02/22  1154  Right ureter  52            Intake/Output Last 24 hours  Intake/Output Summary (Last 24 hours) at 08/23/2022 1045 Last data filed at 08/23/2022 0518 Gross per 24 hour  Intake 2900 ml  Output --  Net 2900 ml    Labs/Imaging Results for orders placed or performed during the hospital encounter of 08/22/22 (from the past 48 hour(s))  Rapid urine drug screen (hospital performed)     Status: Abnormal   Collection Time: 08/22/22  1:10 AM  Result Value Ref Range   Opiates POSITIVE (A) NONE DETECTED   Cocaine NONE DETECTED NONE DETECTED   Benzodiazepines NONE DETECTED NONE DETECTED   Amphetamines NONE DETECTED NONE DETECTED   Tetrahydrocannabinol NONE DETECTED NONE DETECTED   Barbiturates NONE DETECTED NONE DETECTED    Comment: (NOTE) DRUG SCREEN FOR MEDICAL PURPOSES ONLY.  IF CONFIRMATION IS NEEDED FOR ANY PURPOSE, NOTIFY LAB WITHIN 5 DAYS.  LOWEST DETECTABLE LIMITS FOR URINE DRUG SCREEN Drug Class                     Cutoff (ng/mL) Amphetamine and metabolites    1000 Barbiturate and metabolites    200 Benzodiazepine                 200 Opiates and metabolites        300 Cocaine and metabolites        300 THC                            50 Performed at [facility], [address] [place], [place] [address]   Urinalysis, w/ Reflex to Culture  (Infection Suspected) -Urine, Catheterized     Status: Abnormal   Collection Time: 08/22/22  1:11 AM  Result Value Ref Range   Specimen Source URINE, CATHETERIZED    Color, Urine YELLOW YELLOW   APPearance HAZY (A) CLEAR   Specific Gravity, Urine 1.010 1.005 - 1.030   pH 5.0 5.0 - 8.0   Glucose, UA NEGATIVE NEGATIVE mg/dL   Hgb urine dipstick NEGATIVE NEGATIVE   Bilirubin Urine NEGATIVE NEGATIVE   Ketones, ur NEGATIVE NEGATIVE mg/dL   Protein, ur NEGATIVE NEGATIVE mg/dL   Nitrite NEGATIVE NEGATIVE   Leukocytes,Ua MODERATE (A) NEGATIVE   RBC / HPF 0-5 0 - 5 RBC/hpf   WBC, UA 11-20 0 -  5 WBC/hpf    Comment:        Reflex urine culture not performed if WBC <=10, OR if Squamous epithelial cells >5. If Squamous epithelial cells >5 suggest recollection.    Bacteria, UA RARE (A) NONE SEEN   Squamous Epithelial / HPF 0-5 0 - 5 /HPF    Comment: Performed at [facility], [address] [place], [place] [address]  Comprehensive metabolic panel     Status: Abnormal   Collection Time: 08/22/22 11:41 PM  Result Value Ref Range   Sodium 134 (L) 135 - 145 mmol/L   Potassium 4.7 3.5 - 5.1 mmol/L   Chloride 99 98 - 111 mmol/L   CO2 24 22 - 32 mmol/L   Glucose, Bld 142 (H) 70 - 99 mg/dL    Comment: Glucose reference range applies only to samples taken after fasting for at least 8 hours.   BUN 12 6 - 20 mg/dL   Creatinine, Ser 1.30 0.61 - 1.24 mg/dL   Calcium 8.4 (L) 8.9 - 10.3 mg/dL   Total Protein 7.5 6.5 - 8.1 g/dL   Albumin 3.2 (L) 3.5 - 5.0 g/dL   AST 22 15 - 41 U/L   ALT 21 0 - 44 U/L   Alkaline Phosphatase 65 38 - 126 U/L   Total Bilirubin 0.4 0.3 - 1.2 mg/dL   GFR, Estimated >86 >57 mL/min    Comment: (NOTE) Calculated using the CKD-EPI Creatinine Equation (2021)    Anion gap 11 5 - 15    Comment: Performed at [facility], [address] [place], [place] [address]  CBC with Differential     Status: Abnormal   Collection Time: 08/22/22  11:41 PM  Result Value Ref Range   WBC 13.3 (H) 4.0 - 10.5 K/uL   RBC 4.05 (L) 4.22 - 5.81 MIL/uL   Hemoglobin 11.5 (L) 13.0 - 17.0 g/dL   HCT 29.5 (L) 28.4 - 13.2 %   MCV 95.3 80.0 - 100.0 fL   MCH 28.4 26.0 - 34.0 pg   MCHC 29.8 (L) 30.0 - 36.0 g/dL   RDW 44.0 10.2 - 72.5 %   Platelets 259 150 - 400 K/uL   nRBC 0.0 0.0 - 0.2 %   Neutrophils Relative % 77 %   Neutro Abs 10.1 (H) 1.7 - 7.7 K/uL   Lymphocytes Relative 17 %   Lymphs Abs 2.3 0.7 - 4.0 K/uL   Monocytes Relative 4 %   Monocytes Absolute 0.6 0.1 - 1.0 K/uL   Eosinophils Relative 2 %   Eosinophils Absolute 0.3 0.0 - 0.5 K/uL   Basophils Relative 0 %   Basophils Absolute 0.1 0.0 - 0.1 K/uL   Immature Granulocytes 0 %   Abs Immature Granulocytes 0.05 0.00 - 0.07 K/uL    Comment: Performed at [facility], [address] [place], [place] [address]  Protime-INR     Status: None   Collection Time: 08/22/22 11:41 PM  Result Value Ref Range   Prothrombin Time 13.1 11.4 - 15.2 seconds   INR 1.0 0.8 - 1.2    Comment: (NOTE) INR goal varies based on device and disease states. Performed at [facility], [address], [place], [place] [address]   APTT     Status: None   Collection Time: 08/22/22 11:41 PM  Result Value Ref Range   aPTT 26 24 - 36 seconds    Comment: Performed at [facility], [address] [place], [place] [address]  Lactic acid, plasma     Status: Abnormal   Collection Time: 08/22/22 11:45 PM  Result Value Ref Range   Lactic Acid, Venous 2.7 (HH) 0.5 - 1.9 mmol/L    Comment: CRITICAL RESULT CALLED TO, READ BACK BY AND VERIFIED WITH GRAY,S AT 0046 ON 08/23/2022 BY LUZOLOP Performed at [facility], [address] [place], [place] [address]   Ethanol     Status: None   Collection Time: 08/22/22 11:45 PM  Result Value Ref Range   Alcohol, Ethyl (B) <10 <10 mg/dL    Comment: (NOTE) Lowest detectable limit for serum alcohol  is 10 mg/dL.  For medical purposes only. Performed at [facility], [address], [place], [place] [address]   Lactic acid, plasma     Status: Abnormal   Collection Time: 08/23/22  2:50 AM  Result Value Ref Range   Lactic Acid, Venous 2.0 (HH) 0.5 - 1.9 mmol/L    Comment: CRITICAL VALUE NOTED. VALUE IS CONSISTENT WITH PREVIOUSLY REPORTED/CALLED VALUE Performed at [facility], [address], [place], [place] [address]   CBG monitoring, ED     Status: Abnormal   Collection Time: 08/23/22  8:31 AM  Result Value Ref Range   Glucose-Capillary 100 (H) 70 - 99 mg/dL    Comment: Glucose reference range applies only to samples taken after fasting for at least 8 hours.   CT Renal Stone Study  Result Date: 08/23/2022 CLINICAL DATA:  Abdominal/flank pain with stone suspected EXAM: CT ABDOMEN AND PELVIS WITHOUT CONTRAST TECHNIQUE: Multidetector CT imaging of the abdomen and pelvis was performed following the standard protocol without IV contrast. RADIATION DOSE REDUCTION: This exam was performed according to the departmental dose-optimization program which includes automated exposure control, adjustment of the mA and/or kV according to patient size and/or use of iterative reconstruction technique. COMPARISON:  03/31/2022 FINDINGS: Lower chest: Symmetric gynecomastia. Volume loss and opacity in the right lower lobe with some superimposed high-density that is likely calcification when compared to prior. Coronary atheromatous calcification Hepatobiliary: No focal liver abnormality.Layering gallstones. No evidence of biliary inflammation or biliary obstruction Pancreas: Diffuse fatty infiltration Spleen: Unremarkable. Adrenals/Urinary Tract: Negative adrenals. No hydronephrosis. Multiple renal calculi measuring up to 14 mm on the right. Two left renal calculi measuring up to 3 mm. Distended bladder but improved wall thickening. Small dependent bladder calculi.  Stomach/Bowel: Postoperative stomach. Stool distended rectum measuring nearly 9 cm, but no generalized stool retention. Vascular/Lymphatic: No acute vascular abnormality. No mass or adenopathy. Reproductive:No pathologic findings. Other: No ascites or pneumoperitoneum.  Right abdominal wall laxity. Musculoskeletal: L3-4 and L4-5 fusion with solid arthrodesis. Intrathecalcatheter with pump in the left abdominal wall. IMPRESSION: 1. Bilateral nonobstructing nephrolithiasis.  No ureteral calculus. 2. Distended bladder with calculi, correlate for urinary retention. 3. Right lower lobe atelectasis. 4. Cholelithiasis. 5. Stool distended rectum. Electronically Signed   By: Tiburcio Pea M.D.   On: 08/23/2022 05:25   DG Chest Port 1 View  Result Date: 08/23/2022 CLINICAL DATA:  Possible sepsis EXAM: PORTABLE CHEST 1 VIEW COMPARISON:  03/31/2022 FINDINGS: Cardiac shadow is again enlarged. Patient is rotated to the right accentuating the mediastinal markings. No focal infiltrate or effusion is seen. No bony abnormality is noted. IMPRESSION: No acute abnormality noted. Electronically Signed   By: Alcide Clever M.D.   On: 08/23/2022 00:12   CT HEAD WO CONTRAST ( )  Result Date: 08/23/2022 CLINICAL DATA:  Altered mental status EXAM: CT  HEAD WITHOUT CONTRAST TECHNIQUE: Contiguous axial images were obtained from the base of the skull through the vertex without intravenous contrast. RADIATION DOSE REDUCTION: This exam was performed according to the departmental dose-optimization program which includes automated exposure control, adjustment of the mA and/or kV according to patient size and/or use of iterative reconstruction technique. COMPARISON:  03/31/2022 FINDINGS: Brain: No evidence of acute infarction, hemorrhage, hydrocephalus, extra-axial collection or mass lesion/mass effect. Vascular: No hyperdense vessel or unexpected calcification. Skull: Normal. Negative for fracture or focal lesion. Sinuses/Orbits: No acute  finding. Other: None. IMPRESSION: No acute intracranial abnormality noted. Electronically Signed   By: Alcide Clever M.D.   On: 08/23/2022 00:11    Pending Labs Unresulted Labs (From admission, onward)     Start     Ordered   08/24/22 0500  Basic metabolic panel  Tomorrow morning,   R        08/23/22 0724   08/24/22 0500  CBC  Tomorrow morning,   R        08/23/22 0724   08/22/22 2336  Blood Culture (routine x 2)  (Undifferentiated presentation (screening labs and basic nursing orders))  BLOOD CULTURE X 2,   STAT      08/22/22 2336   08/22/22 0111  Urine Culture  Once,   R        08/22/22 0111            Vitals/Pain Today's Vitals   08/23/22 0700 08/23/22 0830 08/23/22 0930 08/23/22 0945  BP: 117/60 97/64 116/67 121/66  Pulse: (!) 57 71 67 69  Resp: 13 20 15 13   Temp:  97.8 F (36.6 C)    TempSrc:  Oral    SpO2: 95% 98% 99% 94%  Weight:      Height:      PainSc:        Isolation Precautions No active isolations  Medications Medications  lactated ringers infusion ( Intravenous New Bag/Given 08/23/22 0617)  enoxaparin (LOVENOX) injection 80 mg (has no administration in time range)  acetaminophen (TYLENOL) tablet 650 mg (has no administration in time range)    Or  acetaminophen (TYLENOL) suppository 650 mg (has no administration in time range)  traZODone (DESYREL) tablet 25 mg (has no administration in time range)  ondansetron (ZOFRAN) tablet 4 mg (has no administration in time range)    Or  ondansetron (ZOFRAN) injection 4 mg (has no administration in time range)  albuterol (PROVENTIL) (2.5 MG/3ML) 0.083% nebulizer solution 2.5 mg (has no administration in time range)  metoprolol tartrate (LOPRESSOR) injection 5 mg (has no administration in time range)  insulin aspart (novoLOG) injection 0-15 Units ( Subcutaneous Not Given 08/23/22 0839)  insulin aspart (novoLOG) injection 0-5 Units (has no administration in time range)  cefTRIAXone (ROCEPHIN) 2 g in sodium chloride  0.9 % 100 mL IVPB (has no administration in time range)  vancomycin (VANCOREADY) IVPB 2000 mg/400 mL (has no administration in time range)  lactated ringers bolus 1,000 mL (0 mLs Intravenous Stopped 08/23/22 0341)    And  lactated ringers bolus 1,000 mL (0 mLs Intravenous Stopped 08/23/22 0317)    And  lactated ringers bolus 400 mL (0 mLs Intravenous Stopped 08/23/22 0518)  cefTRIAXone (ROCEPHIN) 2 g in sodium chloride 0.9 % 100 mL IVPB (0 g Intravenous Stopped 08/23/22 0200)  vancomycin (VANCOREADY) IVPB 2000 mg/400 mL (0 mg Intravenous Stopped 08/23/22 0405)    Mobility walks with person assist     Focused Assessments AMS   R Recommendations:  See Admitting Provider Note  Report given to:   Additional Notes: .

## 2022-08-23 NOTE — Progress Notes (Signed)
A consult was received from an ED physician for Vancomycin per pharmacy dosing.  The patient's profile has been reviewed for ht/wt/allergies/indication/available labs.    A one time order has been placed for Vancomycin 2gm IV.    Further antibiotics/pharmacy consults should be ordered by admitting physician if indicated.                       Thank you, Maryellen Pile, PharmD 08/23/2022  1:21 AM

## 2022-08-23 NOTE — ED Provider Notes (Signed)
[place] EMERGENCY DEPARTMENT AT [facility] Provider Note   CSN: [id number] Arrival date & time: 08/22/22  2256     History  Chief Complaint  Patient presents with   Altered Mental Status    [name] is a 57 y.o. male.  Patient presents to the emergency department with altered mental status.  Patient sent to the emergency department from home.  Significant other called EMS because he suddenly became altered around 9 PM.  Wife reported that he started to see kids in the house that were not there.  Patient somnolent, somewhat lethargic at arrival.  He does awaken to shaking and answers basic questions appropriately but falls back asleep.       Home Medications Prior to Admission medications   Medication Sig Start Date End Date Taking? Authorizing Provider  acetaminophen (TYLENOL) 500 MG tablet Take 1,000 mg by mouth every 6 (six) hours as needed for mild pain or moderate pain.     [provider]  aspirin EC 81 MG tablet Take 81 mg by mouth daily.    [provider]  calcium carbonate (CALCIUM 600) 600 MG TABS tablet Take 1 tablet (600 mg total) by mouth 2 (two) times daily with a meal. Patient not taking: Reported on 04/01/2022 12/27/18 04/01/22  [name], MD  cephALEXin (KEFLEX) 500 MG capsule Take 500 mg by mouth 2 (two) times daily.    [provider]  Cholecalciferol (VITAMIN D3) 125 MCG (5000 UT) CAPS Take 1 capsule (5,000 Units total) by mouth daily with breakfast. Take along with calcium and magnesium. Patient not taking: Reported on 06/24/2022 12/27/18 05/14/21  [name], MD  clotrimazole-betamethasone (LOTRISONE) cream Apply to affected area 2 times day for one week Patient taking differently: Apply 1 Application topically 2 (two) times daily as needed (rash). 10/31/21   Bethann Berkshire, MD  cyclobenzaprine (FLEXERIL) 10 MG tablet Take 1 tablet (10 mg total) by mouth at bedtime. Patient not taking: Reported on  06/24/2022 10/20/19 04/01/22  [name], MD  DULoxetine (CYMBALTA) 30 MG capsule Take 90 mg by mouth daily.    [provider]  esomeprazole (NEXIUM) 40 MG capsule Take 1 capsule (40 mg total) by mouth daily. Patient taking differently: Take 40 mg by mouth daily as needed (heart burn). 12/27/18 06/24/22  [name], MD  furosemide (LASIX) 40 MG tablet Take 40 mg by mouth daily as needed for fluid or edema.    [provider]  HYDROmorphone (DILAUDID) 4 MG tablet Take 1 tablet (4 mg total) by mouth every 6 (six) hours as needed for severe pain. Must last 30 days. Patient not taking: Reported on 06/24/2022 01/14/22 04/01/22  [name], MD  HYDROmorphone (DILAUDID) 4 MG tablet Take 1 tablet (4 mg) by mouth 4 times a day as needed Patient taking differently: Take 4 mg by mouth every 6 (six) hours as needed for moderate pain or severe pain. 04/18/22     morphine (MS CONTIN) 30 MG 12 hr tablet Take 1 tablet (30 mg total) by mouth every 12 (twelve) hours. Must last 30 days. Do not break tablet 01/14/22 06/24/22  [name], MD  naloxone [facility]) nasal spray 4 mg/0.1 mL Place 1 spray into the nose as needed for up to 365 doses (for opioid-induced respiratory depresssion). In case of emergency (overdose), spray once into each nostril. If no response within 3 minutes, repeat application and call 911. 11/21/21 11/21/22  [name], MD  nystatin (MYCOSTATIN/NYSTOP) powder Apply topically  2 (two) times daily. 04/04/22   Rhetta Mura, MD  ondansetron (ZOFRAN-ODT) 4 MG disintegrating tablet 4mg  ODT q4 hours prn nausea/vomit Patient not taking: Reported on 04/01/2022 10/31/21   Bethann Berkshire, MD  pregabalin (LYRICA) 150 MG capsule Take 150 mg by mouth in the morning, at noon, and at bedtime.    [provider]  promethazine (PHENERGAN) 25 MG tablet Take 25-50 mg by mouth every 6 (six) hours as needed for nausea or vomiting.  05/26/19   [provider]  Skin Protectants, Misc. (INTERDRY 16"X096") SHEE Apply 1 application  topically 2 (two) times daily. 04/04/22   Rhetta Mura, MD  tiZANidine (ZANAFLEX) 4 MG tablet Take 1 tablet (4 mg total) by mouth every 8 (eight) hours as needed for muscle spasms. 10/20/19 06/24/22  [name], MD  torsemide (DEMADEX) 20 MG tablet Take 2 tablets (40 mg total) by mouth daily. Patient not taking: Reported on 04/01/2022 09/14/19   Allayne Butcher, PA-C      Allergies    Tape    Review of Systems   Review of Systems  Physical Exam Updated Vital Signs BP 108/62   Pulse (!) 59   Temp (!) 97.5 F (36.4 C) (Oral)   Resp 13   Ht 5\' 11"  (1.803 m)   Wt (!) 158.8 kg   SpO2 97%   BMI 48.82 kg/m  Physical Exam Vitals and nursing note reviewed.  Constitutional:      General: He is not in acute distress.    Appearance: He is well-developed. He is obese.  HENT:     Head: Normocephalic and atraumatic.     Mouth/Throat:     Mouth: Mucous membranes are moist.  Eyes:     General: Vision grossly intact. Gaze aligned appropriately.     Extraocular Movements: Extraocular movements intact.     Conjunctiva/sclera: Conjunctivae normal.  Cardiovascular:     Rate and Rhythm: Normal rate and regular rhythm.     Pulses: Normal pulses.     Heart sounds: Normal heart sounds, S1 normal and S2 normal. No murmur heard.    No friction rub. No gallop.  Pulmonary:     Effort: Pulmonary effort is normal. No respiratory distress.     Breath sounds: Normal breath sounds.  Abdominal:     Palpations: Abdomen is soft.     Tenderness: There is no abdominal tenderness. There is no guarding or rebound.     Hernia: No hernia is present.  Musculoskeletal:        General: No swelling.     Cervical back: Full passive range of motion without pain, normal range of motion and neck supple. No pain with movement, spinous process tenderness or muscular tenderness. Normal range of motion.     Right lower  leg: No edema.     Left lower leg: No edema.  Skin:    Capillary Refill: Capillary refill takes less than 2 seconds.     Findings: Erythema (Diffuse erythema and excoriations on lower back, bilateral buttocks and upper legs) and wound (Right flank, some purulent drainage) present. No ecchymosis or lesion.  Neurological:     Mental Status: He is oriented to person, place, and time.     GCS: GCS eye subscore is 4. GCS verbal subscore is 5. GCS motor subscore is 6.     Cranial Nerves: Cranial nerves 2-12 are intact.     Sensory: Sensation is intact.     Motor: Motor function is intact. No  weakness or abnormal muscle tone.     Coordination: Coordination is intact.  Psychiatric:        Mood and Affect: Mood normal.        Speech: Speech normal.        Behavior: Behavior normal.     ED Results / Procedures / Treatments   Labs (all labs ordered are listed, but only abnormal results are displayed) Labs Reviewed  LACTIC ACID, PLASMA - Abnormal; Notable for the following components:      Result Value   Lactic Acid, Venous 2.7 (*)    All other components within normal limits  LACTIC ACID, PLASMA - Abnormal; Notable for the following components:   Lactic Acid, Venous 2.0 (*)    All other components within normal limits  COMPREHENSIVE METABOLIC PANEL - Abnormal; Notable for the following components:   Sodium 134 (*)    Glucose, Bld 142 (*)    Calcium 8.4 (*)    Albumin 3.2 (*)    All other components within normal limits  CBC WITH DIFFERENTIAL/PLATELET - Abnormal; Notable for the following components:   WBC 13.3 (*)    RBC 4.05 (*)    Hemoglobin 11.5 (*)    HCT 38.6 (*)    MCHC 29.8 (*)    Neutro Abs 10.1 (*)    All other components within normal limits  URINALYSIS, W/ REFLEX TO CULTURE (INFECTION SUSPECTED) - Abnormal; Notable for the following components:   APPearance HAZY (*)    Leukocytes,Ua MODERATE (*)    Bacteria, UA RARE (*)    All other components within normal limits   RAPID URINE DRUG SCREEN, HOSP PERFORMED - Abnormal; Notable for the following components:   Opiates POSITIVE (*)    All other components within normal limits  CULTURE, BLOOD (ROUTINE X 2)  CULTURE, BLOOD (ROUTINE X 2)  URINE CULTURE  PROTIME-INR  APTT  ETHANOL    EKG EKG Interpretation  Date/Time:  Saturday Aug 23 2022 00:12:56 EDT Ventricular Rate:  69 PR Interval:  135 QRS Duration: 114 QT Interval:  402 QTC Calculation: 431 R Axis:   67 Text Interpretation: Sinus rhythm Borderline intraventricular conduction delay Abnormal R-wave progression, early transition Confirmed by Gilda Crease [phone]) on 08/23/2022 1:16:52 AM  Radiology CT Renal Stone Study  Result Date: 08/23/2022 CLINICAL DATA:  Abdominal/flank pain with stone suspected EXAM: CT ABDOMEN AND PELVIS WITHOUT CONTRAST TECHNIQUE: Multidetector CT imaging of the abdomen and pelvis was performed following the standard protocol without IV contrast. RADIATION DOSE REDUCTION: This exam was performed according to the departmental dose-optimization program which includes automated exposure control, adjustment of the mA and/or kV according to patient size and/or use of iterative reconstruction technique. COMPARISON:  03/31/2022 FINDINGS: Lower chest: Symmetric gynecomastia. Volume loss and opacity in the right lower lobe with some superimposed high-density that is likely calcification when compared to prior. Coronary atheromatous calcification Hepatobiliary: No focal liver abnormality.Layering gallstones. No evidence of biliary inflammation or biliary obstruction Pancreas: Diffuse fatty infiltration Spleen: Unremarkable. Adrenals/Urinary Tract: Negative adrenals. No hydronephrosis. Multiple renal calculi measuring up to 14 mm on the right. Two left renal calculi measuring up to 3 mm. Distended bladder but improved wall thickening. Small dependent bladder calculi. Stomach/Bowel: Postoperative stomach. Stool distended rectum  measuring nearly 9 cm, but no generalized stool retention. Vascular/Lymphatic: No acute vascular abnormality. No mass or adenopathy. Reproductive:No pathologic findings. Other: No ascites or pneumoperitoneum.  Right abdominal wall laxity. Musculoskeletal: L3-4 and L4-5 fusion with solid arthrodesis. Intrathecalcatheter with  pump in the left abdominal wall. IMPRESSION: 1. Bilateral nonobstructing nephrolithiasis.  No ureteral calculus. 2. Distended bladder with calculi, correlate for urinary retention. 3. Right lower lobe atelectasis. 4. Cholelithiasis. 5. Stool distended rectum. Electronically Signed   By: Tiburcio Pea M.D.   On: 08/23/2022 05:25   DG Chest Port 1 View  Result Date: 08/23/2022 CLINICAL DATA:  Possible sepsis EXAM: PORTABLE CHEST 1 VIEW COMPARISON:  03/31/2022 FINDINGS: Cardiac shadow is again enlarged. Patient is rotated to the right accentuating the mediastinal markings. No focal infiltrate or effusion is seen. No bony abnormality is noted. IMPRESSION: No acute abnormality noted. Electronically Signed   By: Alcide Clever M.D.   On: 08/23/2022 00:12   CT HEAD WO CONTRAST ( )  Result Date: 08/23/2022 CLINICAL DATA:  Altered mental status EXAM: CT HEAD WITHOUT CONTRAST TECHNIQUE: Contiguous axial images were obtained from the base of the skull through the vertex without intravenous contrast. RADIATION DOSE REDUCTION: This exam was performed according to the departmental dose-optimization program which includes automated exposure control, adjustment of the mA and/or kV according to patient size and/or use of iterative reconstruction technique. COMPARISON:  03/31/2022 FINDINGS: Brain: No evidence of acute infarction, hemorrhage, hydrocephalus, extra-axial collection or mass lesion/mass effect. Vascular: No hyperdense vessel or unexpected calcification. Skull: Normal. Negative for fracture or focal lesion. Sinuses/Orbits: No acute finding. Other: None. IMPRESSION: No acute intracranial  abnormality noted. Electronically Signed   By: Alcide Clever M.D.   On: 08/23/2022 00:11    Procedures Procedures    Medications Ordered in ED Medications  lactated ringers infusion ( Intravenous New Bag/Given 08/23/22 0617)  lactated ringers bolus 1,000 mL (0 mLs Intravenous Stopped 08/23/22 0341)    And  lactated ringers bolus 1,000 mL (0 mLs Intravenous Stopped 08/23/22 0317)    And  lactated ringers bolus 400 mL (0 mLs Intravenous Stopped 08/23/22 0518)  cefTRIAXone (ROCEPHIN) 2 g in sodium chloride 0.9 % 100 mL IVPB (0 g Intravenous Stopped 08/23/22 0200)  vancomycin (VANCOREADY) IVPB 2000 mg/400 mL (0 mg Intravenous Stopped 08/23/22 0405)    ED Course/ Medical Decision Making/ A&P                             Medical Decision Making Amount and/or Complexity of Data Reviewed Independent Historian: EMS External Data Reviewed: labs, radiology, ECG and notes. Labs: ordered. Decision-making details documented in ED Course. Radiology: ordered and independent interpretation performed. Decision-making details documented in ED Course. ECG/medicine tests: ordered and independent interpretation performed. Decision-making details documented in ED Course.  Risk Prescription drug management. Decision regarding hospitalization.   Differential diagnosis considered includes, but not limited to: TIA; Stroke; ICH; Seizure; electrolyte abnormality; hypoglycemia; toxic/pharmacologic causes; CNS infection; psychiatric disorder   Patient somnolent, slightly lethargic arrival.  He does awaken to being shaken slightly and does answer questions appropriately.  No focal neurologic findings.  Head CT does not show any acute abnormality.  He did not have any falls or injury.  Reviewing his records reveals a significant alcohol and drug abuse history.  I suspect that this is part of his confusion.  He is not acutely intoxicated, despite having claimed to have drank 10 beers earlier tonight.  No signs of  withdrawal at this time.  Was mildly hypotensive at arrival.  Looking through his records, blood pressure ranges from 90s to 120 systolic at all of his previous visits.  His lactic acid was slightly elevated.  Was given broad-spectrum  antibiotics for possible sepsis.  He has a history of urosepsis as well as ureterolithiasis.  Renal stone study, however, does not show any ureteral stones or other acute abnormality.  Patient's evaluation at arrival did reveal significant excoriations on his buttocks and lower back.  He was sitting in wet underwear with feces that was adding to some of the excoriations.  Patient did have a draining ulcerated area on the right side of his back/abdomen.  Covered with vancomycin for possible sepsis secondary to skin infection.  CRITICAL CARE Performed by: Gilda Crease   Total critical care time: 35 minutes  Critical care time was exclusive of separately billable procedures and treating other patients.  Critical care was necessary to treat or prevent imminent or life-threatening deterioration.  Critical care was time spent personally by me on the following activities: development of treatment plan with patient and/or surrogate as well as nursing, discussions with consultants, evaluation of patient's response to treatment, examination of patient, obtaining history from patient or surrogate, ordering and performing treatments and interventions, ordering and review of laboratory studies, ordering and review of radiographic studies, pulse oximetry and re-evaluation of patient's condition.         Final Clinical Impression(s) / ED Diagnoses Final diagnoses:  Altered mental status, unspecified altered mental status type    Rx / DC Orders ED Discharge Orders     None         Markiyah Gahm, Canary Brim, MD 08/23/22 [phone]

## 2022-08-23 NOTE — Progress Notes (Signed)
Pharmacy Antibiotic Note  [name] is a 57 y.o. male admitted on 08/22/2022 with sepsis and cellulitis of lower back/buttocks.  Pharmacy has been consulted for vancomycin dosing.  He was recently discharged from [facility] on 5/5 for buttock cellulitis and intertrigo and was prescribed clotrimazole, oral fluconazole, and linezolid.  He has NOT had them filled due to insurance issues.    Plan: Ceftriaxone per MD Vancomycin 2000 mg IV q12h  (SCr 0.8, Vd 0.5, est AUC 520) Measure Vanc levels as needed.  Goal AUC = 400 - 550 Follow up renal function, culture results, and clinical course.   Height: 5\' 11"  (180.3 cm) Weight: (!) 158.8 kg (350 lb) IBW/kg (Calculated) : 75.3  Temp (24hrs), Avg:98.3 F (36.8 C), Min:97.5 F (36.4 C), Max:99.6 F (37.6 C)  Recent Labs  Lab 08/22/22 2341 08/22/22 2345 08/23/22 0250  WBC 13.3*  --   --   CREATININE 0.76  --   --   LATICACIDVEN  --  2.7* 2.0*    Estimated Creatinine Clearance: 160.4 mL/min (by C-G formula based on SCr of 0.76 mg/dL).    Allergies  Allergen Reactions   Tape Other (See Comments)    Blisters - tolerates paper tape well    Antimicrobials this admission: 5/11 Ceftriaxone >>  5/11 Vancomycin >>   Dose adjustments this admission:   Microbiology results: 5/11 BCx:  5/11 UCx:    Thank you for allowing pharmacy to be a part of this patient's care.  Lynann Beaver PharmD, BCPS [facility] main pharmacy [phone] 08/23/2022 8:50 AM

## 2022-08-23 NOTE — H&P (Signed)
History and Physical  [name]:[id number] DOB: Mar 16, 1966 DOA: 08/22/2022  PCP: [name], MD   Chief Complaint: Confusion  HPI: [name] is a 57 y.o. male with medical history significant for type 2 diabetes, morbid obesity, chronic diastolic congestive heart failure, COPD on room air history of prescription drug misuse admitted to the hospital with confusion found to have sepsis from cellulitis.  History is provided by the medical record as well as my discussion with the patient this morning.  Patient was brought in by EMS for altered mental status, wife reports at approximately 9 PM patient suddenly began talking about seeing people in his house that were not there.  At that time, patient stated that he drank 10 beers last evening.  Upon EMS evaluation, patient was noted to have slightly slurred speech, but no focal neurologic deficits.  He was found to be hypotensive in the 60s and half a liter of normal saline was given prior to arrival.  While the patient does not remember the details of last evening, he tells me that prior to yesterday evening he was feeling well and has no complaints.  Denies any recent fevers, chills, nausea, vomiting, chest pain.  He does state that he has had a yeast infection on his bottom and has been taking something to treat that.  Reviewing patient's medical record, he was just discharged from [facility] on 5/5 after a short stay for buttock cellulitis and intertrigo.  He was discharged with clotrimazole, oral fluconazole, and linezolid, which were to be completed on 5/12.  However, [name] tells me he has not been taking this prescribed medications since he could not get them filled at the pharmacy due to an insurance issue.  ED Course: On evaluation in the emergency department, patient was found to be hypotensive, lactate elevated at 2.7, WBC 13.3.  Code sepsis was activated, he was given 30 cc/kg of IV fluid, remains on LR at 150 cc/h.   His lactic acid improved from 2.7-2.0.  Currently he is resting comfortably, and has no complaints.  Review of Systems: Please see HPI for pertinent positives and negatives. A complete 10 system review of systems are otherwise negative.  Past Medical History:  Diagnosis Date   Arthritis    Asthma    as child   Back pain    CHF (congestive heart failure) (HCC)    COPD (chronic obstructive pulmonary disease) (HCC)    GERD (gastroesophageal reflux disease)    Headache    Hernia    History of hiatal hernia    History of kidney stones    Hypogonadism in male    Neuromuscular disorder (HCC)    back injury with surgery   On home oxygen therapy    PONV (postoperative nausea and vomiting)    gets nausea   Pre-diabetes    Shortness of breath dyspnea    Sleep apnea    Does not use CPAP, new equipment 12/2014   Ulcer    Wears glasses    Past Surgical History:  Procedure Laterality Date   BACK SURGERY     CYSTOSCOPY W/ URETERAL STENT PLACEMENT Left 03/31/2022   Procedure: CYSTOSCOPY WITH RETROGRADE PYELOGRAM/URETERAL STENT PLACEMENT;  Surgeon: Rene Paci, MD;  Location: [facility];  Service: Urology;  Laterality: Left;   CYSTOSCOPY/URETEROSCOPY/HOLMIUM LASER/STENT PLACEMENT Bilateral 07/02/2022   Procedure: CYSTOSCOPY, LEFT STENT REMOVAL, BILATERAL URETEROSCOPY, HOLMIUM LASER, AND RIGHT STENT PLACEMENT;  Surgeon: Rene Paci, MD;  Location: [facility];  Service: Urology;  Laterality: Bilateral;  90 MINUTES   JOINT REPLACEMENT     Left knee replacement   LAPAROSCOPIC GASTRIC SLEEVE RESECTION N/A 01/08/2015   Procedure: LAPAROSCOPIC GASTRIC SLEEVE RESECTION;  Surgeon: Luretha Murphy, MD;  Location: [facility];  Service: General;  Laterality: N/A;   morphine pump     RIGHT HEART CATH N/A 09/22/2019   Procedure: RIGHT HEART CATH;  Surgeon: Dolores Patty, MD;  Location: [facility];  Service: Cardiovascular;  Laterality: N/A;   SHOULDER ARTHROSCOPY WITH  SUBACROMIAL DECOMPRESSION AND BICEP TENDON REPAIR Left 10/06/2013   Procedure: LEFT SHOULDER ARTHROSCOPY DEBRIDEMENT EXTENTSIVE,DISTAL CLAVICULECTOMY,DECOMPRESSION SUBACROMIAL PARTIAL ACROMIOPLASTY WITH ROTATOR CUFF REPAIR, and excision of CALCIUM DEPOSIT;  Surgeon: Loreta Ave, MD;  Location: [facility];  Service: Orthopedics;  Laterality: Left;   SHOULDER ARTHROSCOPY WITH SUBACROMIAL DECOMPRESSION, ROTATOR CUFF REPAIR AND BICEP TENDON REPAIR Right 08/18/2013   Procedure: RIGHT SHOULDER ARTHROSCOPY WITH SUBACROMIAL DECOMPRESSION/PARTIAL ACROMIOPLASTY WITH CORACOACROMIAL RELEASE/DISTAL CLAVICULECTOMY/ ROTATOR CUFF REPAIR/DEBRIDEMENT EXTENTSIVE;  Surgeon: Loreta Ave, MD;  Location: [facility];  Service: Orthopedics;  Laterality: Right;  ANESTHESIA: GENERAL, PRE/POST OP SCALENE   TONSILLECTOMY      Social History:  reports that he has never smoked. His smokeless tobacco use includes snuff and chew. He reports current alcohol use. He reports that he does not use drugs.   Allergies  Allergen Reactions   Tape Other (See Comments)    Blisters - tolerates paper tape well    Family History  Problem Relation Age of Onset   Diabetes Mother    Pneumonia Father      Prior to Admission medications   Medication Sig Start Date End Date Taking? Authorizing Provider  acetaminophen (TYLENOL) 500 MG tablet Take 1,000 mg by mouth every 6 (six) hours as needed for mild pain or moderate pain.     [provider]  aspirin EC 81 MG tablet Take 81 mg by mouth daily.    [provider]  calcium carbonate (CALCIUM 600) 600 MG TABS tablet Take 1 tablet (600 mg total) by mouth 2 (two) times daily with a meal. Patient not taking: Reported on 04/01/2022 12/27/18 04/01/22  [name], MD  cephALEXin (KEFLEX) 500 MG capsule Take 500 mg by mouth 2 (two) times daily.    [provider]  Cholecalciferol (VITAMIN D3) 125 MCG (5000 UT) CAPS Take 1 capsule  (5,000 Units total) by mouth daily with breakfast. Take along with calcium and magnesium. Patient not taking: Reported on 06/24/2022 12/27/18 05/14/21  [name], MD  clotrimazole-betamethasone (LOTRISONE) cream Apply to affected area 2 times day for one week Patient taking differently: Apply 1 Application topically 2 (two) times daily as needed (rash). 10/31/21   Bethann Berkshire, MD  cyclobenzaprine (FLEXERIL) 10 MG tablet Take 1 tablet (10 mg total) by mouth at bedtime. Patient not taking: Reported on 06/24/2022 10/20/19 04/01/22  [name], MD  DULoxetine (CYMBALTA) 30 MG capsule Take 90 mg by mouth daily.    [provider]  esomeprazole (NEXIUM) 40 MG capsule Take 1 capsule (40 mg total) by mouth daily. Patient taking differently: Take 40 mg by mouth daily as needed (heart burn). 12/27/18 06/24/22  [name], MD  furosemide (LASIX) 40 MG tablet Take 40 mg by mouth daily as needed for fluid or edema.    [provider]  HYDROmorphone (DILAUDID) 4 MG tablet Take 1 tablet (4 mg total) by mouth every 6 (six) hours as needed for severe  pain. Must last 30 days. Patient not taking: Reported on 06/24/2022 01/14/22 04/01/22  [name], MD  HYDROmorphone (DILAUDID) 4 MG tablet Take 1 tablet (4 mg) by mouth 4 times a day as needed Patient taking differently: Take 4 mg by mouth every 6 (six) hours as needed for moderate pain or severe pain. 04/18/22     morphine (MS CONTIN) 30 MG 12 hr tablet Take 1 tablet (30 mg total) by mouth every 12 (twelve) hours. Must last 30 days. Do not break tablet 01/14/22 06/24/22  [name], MD  naloxone [facility]) nasal spray 4 mg/0.1 mL Place 1 spray into the nose as needed for up to 365 doses (for opioid-induced respiratory depresssion). In case of emergency (overdose), spray once into each nostril. If no response within 3 minutes, repeat application and call 911. 11/21/21 11/21/22  [name], MD  nystatin  (MYCOSTATIN/NYSTOP) powder Apply topically 2 (two) times daily. 04/04/22   Rhetta Mura, MD  ondansetron (ZOFRAN-ODT) 4 MG disintegrating tablet 4mg  ODT q4 hours prn nausea/vomit Patient not taking: Reported on 04/01/2022 10/31/21   Bethann Berkshire, MD  pregabalin (LYRICA) 150 MG capsule Take 150 mg by mouth in the morning, at noon, and at bedtime.    [provider]  promethazine (PHENERGAN) 25 MG tablet Take 25-50 mg by mouth every 6 (six) hours as needed for nausea or vomiting.  05/26/19   [provider]  Skin Protectants, Misc. (INTERDRY 04"V409") SHEE Apply 1 application  topically 2 (two) times daily. 04/04/22   Rhetta Mura, MD  tiZANidine (ZANAFLEX) 4 MG tablet Take 1 tablet (4 mg total) by mouth every 8 (eight) hours as needed for muscle spasms. 10/20/19 06/24/22  [name], MD  torsemide (DEMADEX) 20 MG tablet Take 2 tablets (40 mg total) by mouth daily. Patient not taking: Reported on 04/01/2022 09/14/19   Allayne Butcher, PA-C    Physical Exam: BP 117/60   Pulse (!) 57   Temp (!) 97.5 F (36.4 C) (Oral)   Resp 13   Ht 5\' 11"  (1.803 m)   Wt (!) 158.8 kg   SpO2 95%   BMI 48.82 kg/m   General: Gentleman lying on a stretcher in the ER, napping, easily arousable, pleasant and cooperative.  He is alert and oriented x 4. Neck: supple, no masses, trachea mildline  Cardiovascular: RRR, no murmurs or rubs, no peripheral edema  Respiratory: clear to auscultation bilaterally, no wheezes, no crackles  Abdomen: soft, nontender, nondistended, normal bowel tones heard  Skin: Skin on upper bilateral buttock, and low back, warm, erythematous, with multiple excoriations.  No open areas or areas of drainage seen. Musculoskeletal: no joint effusions, normal range of motion  Psychiatric: appropriate affect, normal speech  Neurologic: extraocular muscles intact, clear speech, moving all extremities with intact sensorium          Labs on Admission:   Basic Metabolic Panel: Recent Labs  Lab 08/22/22 2341  NA 134*  K 4.7  CL 99  CO2 24  GLUCOSE 142*  BUN 12  CREATININE 0.76  CALCIUM 8.4*   Liver Function Tests: Recent Labs  Lab 08/22/22 2341  AST 22  ALT 21  ALKPHOS 65  BILITOT 0.4  PROT 7.5  ALBUMIN 3.2*   No results for input(s): "LIPASE", "AMYLASE" in the last 168 hours. No results for input(s): "AMMONIA" in the last 168 hours. CBC: Recent Labs  Lab 08/22/22 2341  WBC 13.3*  NEUTROABS 10.1*  HGB 11.5*  HCT 38.6*  MCV 95.3  PLT 259   Cardiac Enzymes: No results for input(s): "CKTOTAL", "CKMB", "CKMBINDEX", "TROPONINI" in the last 168 hours.  BNP (last 3 results) No results for input(s): "BNP" in the last 8760 hours.  ProBNP (last 3 results) No results for input(s): "PROBNP" in the last 8760 hours.  CBG: No results for input(s): "GLUCAP" in the last 168 hours.  Radiological Exams on Admission: CT Renal Stone Study  Result Date: 08/23/2022 CLINICAL DATA:  Abdominal/flank pain with stone suspected EXAM: CT ABDOMEN AND PELVIS WITHOUT CONTRAST TECHNIQUE: Multidetector CT imaging of the abdomen and pelvis was performed following the standard protocol without IV contrast. RADIATION DOSE REDUCTION: This exam was performed according to the departmental dose-optimization program which includes automated exposure control, adjustment of the mA and/or kV according to patient size and/or use of iterative reconstruction technique. COMPARISON:  03/31/2022 FINDINGS: Lower chest: Symmetric gynecomastia. Volume loss and opacity in the right lower lobe with some superimposed high-density that is likely calcification when compared to prior. Coronary atheromatous calcification Hepatobiliary: No focal liver abnormality.Layering gallstones. No evidence of biliary inflammation or biliary obstruction Pancreas: Diffuse fatty infiltration Spleen: Unremarkable. Adrenals/Urinary Tract: Negative adrenals. No hydronephrosis. Multiple renal  calculi measuring up to 14 mm on the right. Two left renal calculi measuring up to 3 mm. Distended bladder but improved wall thickening. Small dependent bladder calculi. Stomach/Bowel: Postoperative stomach. Stool distended rectum measuring nearly 9 cm, but no generalized stool retention. Vascular/Lymphatic: No acute vascular abnormality. No mass or adenopathy. Reproductive:No pathologic findings. Other: No ascites or pneumoperitoneum.  Right abdominal wall laxity. Musculoskeletal: L3-4 and L4-5 fusion with solid arthrodesis. Intrathecalcatheter with pump in the left abdominal wall. IMPRESSION: 1. Bilateral nonobstructing nephrolithiasis.  No ureteral calculus. 2. Distended bladder with calculi, correlate for urinary retention. 3. Right lower lobe atelectasis. 4. Cholelithiasis. 5. Stool distended rectum. Electronically Signed   By: Tiburcio Pea M.D.   On: 08/23/2022 05:25   DG Chest Port 1 View  Result Date: 08/23/2022 CLINICAL DATA:  Possible sepsis EXAM: PORTABLE CHEST 1 VIEW COMPARISON:  03/31/2022 FINDINGS: Cardiac shadow is again enlarged. Patient is rotated to the right accentuating the mediastinal markings. No focal infiltrate or effusion is seen. No bony abnormality is noted. IMPRESSION: No acute abnormality noted. Electronically Signed   By: Alcide Clever M.D.   On: 08/23/2022 00:12   CT HEAD WO CONTRAST ( )  Result Date: 08/23/2022 CLINICAL DATA:  Altered mental status EXAM: CT HEAD WITHOUT CONTRAST TECHNIQUE: Contiguous axial images were obtained from the base of the skull through the vertex without intravenous contrast. RADIATION DOSE REDUCTION: This exam was performed according to the departmental dose-optimization program which includes automated exposure control, adjustment of the mA and/or kV according to patient size and/or use of iterative reconstruction technique. COMPARISON:  03/31/2022 FINDINGS: Brain: No evidence of acute infarction, hemorrhage, hydrocephalus, extra-axial  collection or mass lesion/mass effect. Vascular: No hyperdense vessel or unexpected calcification. Skull: Normal. Negative for fracture or focal lesion. Sinuses/Orbits: No acute finding. Other: None. IMPRESSION: No acute intracranial abnormality noted. Electronically Signed   By: Alcide Clever M.D.   On: 08/23/2022 00:11    Assessment/Plan This is a pleasant 57 year old gentleman with a history of morbid obesity, prediabetes, recent hospital stay discharged about 1 week ago after being treated for buttock cellulitis returns with confusion found to have sepsis from buttock/low back cellulitis. Unfortunately was unable to fill prescription for Linezolid after recent discharge. Meeting sepsis criteria with hypotension, leukocytosis, metabolic encephalopathy, lactic acidosis. -Observation admission -Follow blood cultures -Continue  empiric IV vancomycin and Rocephin -Recheck lactate now -Continue LR  Metabolic encephalopathy due to sepsis from cellulitis.  History of prediabetes-last hemoglobin A1c 5.5 -Will place on regular diet -Start sliding scale insulin and changed to diabetic diet in case of hyperglycemia    Chronic diastolic heart failure (HCC)-patient currently not showing overt signs of heart failure/fluid overload, however continue fluids with caution    Hyponatremia-this is mild and asymptomatic, will follow with daily labs, continue hydration  DVT prophylaxis: Lovenox     Code Status: Full Code  Consults called: None  Admission status: Observation  Time spent: 43 minutes  Tamre Cass Sharlette Dense MD [facility] Pager [phone]  If 7PM-7AM, please contact night-coverage [email or web address] Password [facility]  08/23/2022, 7:24 AM

## 2022-08-24 DIAGNOSIS — L0390 Cellulitis, unspecified: Secondary | ICD-10-CM | POA: Diagnosis not present

## 2022-08-24 DIAGNOSIS — A419 Sepsis, unspecified organism: Secondary | ICD-10-CM

## 2022-08-24 LAB — CBC
HCT: 39.4 % (ref 39.0–52.0)
Hemoglobin: 12.1 g/dL — ABNORMAL LOW (ref 13.0–17.0)
MCH: 28.8 pg (ref 26.0–34.0)
MCHC: 30.7 g/dL (ref 30.0–36.0)
MCV: 93.8 fL (ref 80.0–100.0)
Platelets: 231 10*3/uL (ref 150–400)
RBC: 4.2 MIL/uL — ABNORMAL LOW (ref 4.22–5.81)
RDW: 15.5 % (ref 11.5–15.5)
WBC: 7.2 10*3/uL (ref 4.0–10.5)
nRBC: 0 % (ref 0.0–0.2)

## 2022-08-24 LAB — GLUCOSE, CAPILLARY
Glucose-Capillary: 109 mg/dL — ABNORMAL HIGH (ref 70–99)
Glucose-Capillary: 109 mg/dL — ABNORMAL HIGH (ref 70–99)
Glucose-Capillary: 98 mg/dL (ref 70–99)
Glucose-Capillary: 113 mg/dL — ABNORMAL HIGH (ref 70–99)

## 2022-08-24 LAB — URINE CULTURE: Culture: NO GROWTH

## 2022-08-24 LAB — BASIC METABOLIC PANEL
Anion gap: 12 (ref 5–15)
BUN: 9 mg/dL (ref 6–20)
CO2: 27 mmol/L (ref 22–32)
Calcium: 8.7 mg/dL — ABNORMAL LOW (ref 8.9–10.3)
Chloride: 100 mmol/L (ref 98–111)
Creatinine, Ser: 0.62 mg/dL (ref 0.61–1.24)
GFR, Estimated: >60 mL/min (ref >60)
Glucose, Bld: 138 mg/dL — ABNORMAL HIGH (ref 70–99)
Potassium: 3.8 mmol/L (ref 3.5–5.1)
Sodium: 139 mmol/L (ref 135–145)

## 2022-08-24 LAB — CULTURE, BLOOD (ROUTINE X 2)
Special Requests: ADEQUATE
Special Requests: ADEQUATE

## 2022-08-24 MED ORDER — HYDROMORPHONE HCL 4 MG PO TABS
4.0000 mg | ORAL_TABLET | Freq: Four times a day (QID) | ORAL | Status: DC | PRN
  Administered 2022-08-24 – 2022-08-25 (×5): 4 mg via ORAL
  Filled 2022-08-24 (×5): qty 1

## 2022-08-24 MED ORDER — NYSTATIN 100000 UNIT/GM EX POWD
Freq: Two times a day (BID) | CUTANEOUS | Status: DC
  Filled 2022-08-24: qty 15

## 2022-08-24 MED ORDER — POLYETHYLENE GLYCOL 3350 17 G PO PACK
17.0000 g | PACK | Freq: Every day | ORAL | Status: DC
  Filled 2022-08-24: qty 1

## 2022-08-24 NOTE — Evaluation (Signed)
Physical Therapy Evaluation Patient Details Name: [name] MRN: [id number] DOB: 15-Aug-1965 Today's Date: 08/24/2022  History of Present Illness  57 year old brought from home with altered mental status and suspected sepsis present on admission due to hypotension, hypovolemia and lactic acidosis.  PMHx: type 2 diabetes, morbid obesity, chronic diastolic dysfunction, COPD, spine injury and chronic pain management, bedbound status but typically able to squat pivot transfer to w/c  Clinical Impression  Pt admitted with above diagnosis.  Pt currently with functional limitations due to the deficits listed below (see PT Problem List). Pt will benefit from acute skilled PT to increase their independence and safety with mobility to allow discharge.  Pt was able to sit EOB, scoot up towards [facility] and return to supine without physical assist.  Pt states his bed mattress is difficulty to perform transfers with and he has purchased a new bed however there has been great difficulty with delivery.  Pt could benefit from transfer/sliding board upon d/c.  Spouse arrived during evaluation as well and reports she does assist with some tasks however she is not in good health herself and appears to have some caregiver burnout.         Recommendations for follow up therapy are one component of a multi-disciplinary discharge planning process, led by the attending physician.  Recommendations may be updated based on patient status, additional functional criteria and insurance authorization.  Follow Up Recommendations       Assistance Recommended at Discharge PRN  Patient can return home with the following  A little help with walking and/or transfers;A little help with bathing/dressing/bathroom;Help with stairs or ramp for entrance    Equipment Recommendations Other (comment) (sliding/transfer board)  Recommendations for Other Services       Functional Status Assessment Patient has had a recent decline in their  functional status and demonstrates the ability to make significant improvements in function in a reasonable and predictable amount of time.     Precautions / Restrictions Precautions Precautions: Fall      Mobility  Bed Mobility Overal bed mobility: Modified Independent             General bed mobility comments: pt able to sit EOB and return to supine without any physical assist    Transfers                   General transfer comment: declined transfer out of bed however was able to scoot up [facility] without assist    Ambulation/Gait                  Stairs            Wheelchair Mobility    Modified Rankin (Stroke Patients Only)       Balance Overall balance assessment: Needs assistance Sitting-balance support: No upper extremity supported, Feet supported Sitting balance-Leahy Scale: Good         Standing balance comment: transfers only                             Pertinent Vitals/Pain Pain Assessment Pain Assessment: Faces Faces Pain Scale: Hurts even more Pain Location: back Pain Descriptors / Indicators: Aching, Radiating Pain Intervention(s): Repositioned, Monitored during session    Home Living Family/patient expects to be discharged to:: Private residence Living Arrangements: Spouse/significant other;Children Available Help at Discharge: Family Type of Home: House         Home Layout: One level Home  Equipment: Art gallery manager;Shower seat;Grab bars - tub/shower Additional Comments: has new bed being ordered with bed rails. working on making ramp more accessable with having to go through "moist area" to get to ramp entrance    Prior Function Prior Level of Function : Needs assist       Physical Assist : Mobility (physical);ADLs (physical) Mobility (physical): Transfers;Bed mobility ADLs (physical): Bathing;Dressing;Toileting;IADLs Mobility Comments: squat-pivot to motorized w/c and to shower seat. extra  time/effort, painful but no  physical assist. last reported fall was 2 months ago. ADLs Comments: Pt endorses struggling with basic ADLs such as toileting, pericare, bathing/dressing. mod A for LB     Hand Dominance        Extremity/Trunk Assessment        Lower Extremity Assessment Lower Extremity Assessment: Generalized weakness;RLE deficits/detail;LLE deficits/detail RLE Sensation: history of peripheral neuropathy LLE Sensation: history of peripheral neuropathy       Communication   Communication: No difficulties  Cognition Arousal/Alertness: Awake/alert Behavior During Therapy: Flat affect                                   General Comments: uncertain if HOH at times or still confused but did have out of context answers for questions but provided more appropriate responses with repeated question        General Comments      Exercises     Assessment/Plan    PT Assessment Patient needs continued PT services  PT Problem List Decreased strength;Decreased activity tolerance;Decreased mobility;Obesity       PT Treatment Interventions DME instruction;Therapeutic exercise;Wheelchair mobility training;Functional mobility training;Therapeutic activities;Patient/family education;Balance training    PT Goals (Current goals can be found in the Care Plan section)  Acute Rehab PT Goals PT Goal Formulation: With patient/family Time For Goal Achievement: 09/07/22 Potential to Achieve Goals: Good    Frequency Min 1X/week     Co-evaluation PT/OT/SLP Co-Evaluation/Treatment: Yes Reason for Co-Treatment: For patient/therapist safety;To address functional/ADL transfers PT goals addressed during session: Mobility/safety with mobility OT goals addressed during session: ADL's and self-care       AM-PAC PT "6 Clicks" Mobility  Outcome Measure Help needed turning from your back to your side while in a flat bed without using bedrails?: None Help needed moving  from lying on your back to sitting on the side of a flat bed without using bedrails?: None Help needed moving to and from a bed to a chair (including a wheelchair)?: A Lot Help needed standing up from a chair using your arms (e.g., wheelchair or bedside chair)?: A Lot Help needed to walk in hospital room?: Total Help needed climbing 3-5 steps with a railing? : Total 6 Click Score: 14    End of Session   Activity Tolerance: Patient tolerated treatment well Patient left: in bed;with call bell/phone within reach;with bed alarm set;with family/visitor present Nurse Communication: Mobility status PT Visit Diagnosis: Other abnormalities of gait and mobility (R26.89)    Time: 1610-9604 PT Time Calculation (min) (ACUTE ONLY): 16 min   Charges:   PT Evaluation $PT Eval Low Complexity: 1 Low        Kati PT, DPT Physical Therapist Acute Rehabilitation Services Office: [phone]   Janan Halter [place] 08/24/2022, 4:14 PM

## 2022-08-24 NOTE — Evaluation (Signed)
Occupational Therapy Evaluation Patient Details Name: [name] MRN: [id number] DOB: Mar 22, 1966 Today's Date: 08/24/2022   History of Present Illness Patient is a 57 year old male who presented with AMS and reports of hallucinations. Patient was admitted with suspected sepsis, hypotension, hypovolemia, lactic acidosis, slurred speech, acute metabolic encephalopathy, PMH: chronic pain management, type II DM, morbid obesity.   Clinical Impression   Patient is a 57 year old male who was admitted for above. Patient evaluated by Occupational Therapy with no further acute OT needs identified. All education has been completed and the patient has no further questions. Patient was able to complete bed mobility and scooting up to head of bed on soft surface with MI. Patient limited with chronic back pain. Patient reported wife assists for all ADLs at home at baseline.  See below for any follow-up Occupational Therapy or equipment needs. OT is signing off. Thank you for this referral.      Recommendations for follow up therapy are one component of a multi-disciplinary discharge planning process, led by the attending physician.  Recommendations may be updated based on patient status, additional functional criteria and insurance authorization.   Assistance Recommended at Discharge Frequent or constant Supervision/Assistance  Patient can return home with the following A little help with walking and/or transfers;A lot of help with bathing/dressing/bathroom;Assistance with cooking/housework;Direct supervision/assist for medications management;Assist for transportation;Help with stairs or ramp for entrance;Direct supervision/assist for financial management       Equipment Recommendations  None recommended by OT       Precautions / Restrictions Precautions Precautions: Fall Restrictions Weight Bearing Restrictions: No      Mobility Bed Mobility Overal bed mobility: Modified Independent              General bed mobility comments: pt able to sit EOB and return to supine without any physical assist    Transfers       General transfer comment: declined transfer out of bed however was able to scoot up [facility] without assist      Balance Overall balance assessment: Needs assistance Sitting-balance support: No upper extremity supported, Feet supported Sitting balance-Leahy Scale: Good               ADL either performed or assessed with clinical judgement   ADL Overall ADL's : At baseline       General ADL Comments: patient reported having wife support at home for ADL tasks such as bathing, dressing and clothing managment at toilet. patient declined to engage in transfers on this date. patient was able to transition to EOB with supervision on R side of bed and scoot down without any physical assistance. patient's wife in room reported they are waiting for a bed to arrive from the state that has bed rails. patient returned to bed himself with reports of pain in lumbar area of back and radiating down.     Vision   Vision Assessment?: No apparent visual deficits            Pertinent Vitals/Pain Pain Assessment Pain Assessment: Faces Faces Pain Scale: Hurts even more Pain Location: back Pain Descriptors / Indicators: Aching, Radiating Pain Intervention(s): Monitored during session, Repositioned        Extremity/Trunk Assessment Upper Extremity Assessment Upper Extremity Assessment: Overall WFL for tasks assessed (patient reported history of shoulder injuries but ROM and strength WFL. patient reported numbness on pinky ring and middle of middle digit bilaterally.)   Lower Extremity Assessment Lower Extremity Assessment: Defer to PT  evaluation RLE Sensation: history of peripheral neuropathy LLE Sensation: history of peripheral neuropathy   Cervical / Trunk Assessment Cervical / Trunk Assessment: Normal   Communication Communication Communication: No  difficulties   Cognition Arousal/Alertness: Awake/alert Behavior During Therapy: Flat affect Overall Cognitive Status: Difficult to assess         General Comments: patients wife entered room during session with patient and wife answering questions asked.                Home Living Family/patient expects to be discharged to:: Private residence Living Arrangements: Spouse/significant other;Children Available Help at Discharge: Family Type of Home: House       Home Layout: One level     Bathroom Shower/Tub: Runner, broadcasting/film/video: Art gallery manager;Shower seat;Grab bars - tub/shower   Additional Comments: has new bed being ordered with bed rails. working on making ramp more accessable with having to go through "moist area" to get to ramp entrance      Prior Functioning/Environment Prior Level of Function : Needs assist       Physical Assist : Mobility (physical);ADLs (physical) Mobility (physical): Transfers;Bed mobility ADLs (physical): Bathing;Dressing;Toileting;IADLs Mobility Comments: squat-pivot to motorized w/c and to shower seat. extra time/effort, painful but no  physical assist. last reported fall was 2 months ago. ADLs Comments: Pt endorses struggling with basic ADLs such as toileting, pericare, bathing/dressing.              OT Treatment/Interventions:      OT Goals(Current goals can be found in the care plan section) Acute Rehab OT Goals Patient Stated Goal: to go home tomorrow OT Goal Formulation: With patient/family Time For Goal Achievement: 09/07/22 Potential to Achieve Goals: Fair  OT Frequency:      Co-evaluation PT/OT/SLP Co-Evaluation/Treatment: Yes Reason for Co-Treatment: For patient/therapist safety;To address functional/ADL transfers PT goals addressed during session: Mobility/safety with mobility OT goals addressed during session: ADL's and self-care      AM-PAC OT "6 Clicks" Daily Activity     Outcome Measure  Help from another person eating meals?: None Help from another person taking care of personal grooming?: None Help from another person toileting, which includes using toliet, bedpan, or urinal?: A Lot Help from another person bathing (including washing, rinsing, drying)?: A Lot Help from another person to put on and taking off regular upper body clothing?: A Lot Help from another person to put on and taking off regular lower body clothing?: A Lot 6 Click Score: 16   End of Session Nurse Communication: Other (comment) (ok to participate in session)  Activity Tolerance: Patient limited by pain Patient left: in bed;with call bell/phone within reach;with bed alarm set;with family/visitor present  OT Visit Diagnosis: Unsteadiness on feet (R26.81)                Time: 1610-9604 OT Time Calculation (min): 16 min Charges:  OT General Charges $OT Visit: 1 Visit OT Evaluation $OT Eval Low Complexity: 1 Low  Davianna Deutschman OTR/L, MS Acute Rehabilitation Department Office# [phone]   Selinda Flavin 08/24/2022, 4:38 PM

## 2022-08-24 NOTE — Progress Notes (Signed)
PROGRESS NOTE    [name]  ZOX:[id number] DOB: 22-Jan-1966 DOA: 08/22/2022 PCP: [name], MD    Brief Narrative:  57 year old with type 2 diabetes, morbid obesity, chronic diastolic dysfunction, COPD, spine injury and chronic pain management, bedbound status brought from home with altered mental status.  According to the family, he was confused and was complaining of seeing people in his house that were not there. Patient took morphine 15 mg, Dilaudid 4 mg and according to the reports he had 10 beers to drink.  In the emergency room his blood pressure was 60s responded to IV fluids.  Patient does not remember details. Recently admitted at [facility] for buttock cellulitis and intertrigo discharged on clotrimazole, fluconazole and linezolid however did not complete.  In the ER code sepsis was called due to low blood pressures, resuscitated, stabilized and transferred to hospital for admission.   Assessment & Plan:   Suspected sepsis present on admission due to hypotension, hypovolemia and lactic acidosis.  Presentation is likely related to hypovolemia than sepsis.  Given significant findings on admission we will continue IV antibiotics today with vancomycin and Rocephin.  Will discontinue antibiotics by tomorrow if no source identified. Responded adequately to IV fluids, discontinue further fluids.  Encourage oral intake.  Slurred speech/confusion and hypovolemia, acute metabolic encephalopathy: This is likely related to multiple doses of narcotics, he takes morphine and Dilaudid both from pain management clinic.  Patient is also on Lyrica and Zanaflex.  On top of this he drank 10 cans of beer. Mental status has improved now.  Blood pressure has improved. Start Dilaudid 4 mg every 4 hours as needed.  Continue to hold morphine, Lyrica and Zanaflex.  Chronic pain management, as above.  Type 2 diabetes: Diet controlled at home.  Not on treatment.  Remains on sliding scale  insulin.  Morbid obesity/diastolic dysfunction/COPD: Chronic and stable.  Ambulatory dysfunction/bedbound status: He is bedbound and wheelchair-bound.  May need to continue to work with PT OT at home.  Will consult PT OT.   DVT prophylaxis: SCDs Start: 08/23/22 8119   Code Status: Full code Family Communication: Called patient's wife and daughter, unable Disposition Plan: Status is: Inpatient Remains inpatient appropriate because: Treated for sepsis     Consultants:  None  Procedures:  None  Antimicrobials:  Vancomycin Rocephin 5/11----   Subjective: Patient seen and examined.  No overnight events.  He tells me he is alert awake and he is back to his baseline.  He is worried about excoriations on his back.  Examined in details with the help of nursing staff and pictures below.  Remains afebrile.  Blood pressures are stable.  Patient tells me that he did not get to use any antibiotics after discharge.  Objective: Vitals:   08/23/22 1622 08/23/22 2015 08/23/22 2353 08/24/22 0526  BP: 133/66 119/63 139/66 120/61  Pulse: 84 69 69 71  Resp:  16 18   Temp: 98.1 F (36.7 C) 97.8 F (36.6 C) 97.6 F (36.4 C) 98 F (36.7 C)  TempSrc: Oral Oral Oral Oral  SpO2: 98% 99% 99% 100%  Weight:      Height:        Intake/Output Summary (Last 24 hours) at 08/24/2022 1112 Last data filed at 08/24/2022 0526 Gross per 24 hour  Intake 900 ml  Output 1600 ml  Net -700 ml   Filed Weights   08/22/22 2317  Weight: (!) 158.8 kg    Examination:  General exam: Appears calm and comfortable  Morbidly obese and chronically sick looking gentleman laying in bed and soaked with urine. Respiratory system: Clear to auscultation. Respiratory effort normal. Cardiovascular system: S1 & S2 heard, RRR. No pedal edema. Gastrointestinal system: Soft.  Nontender.  Obese and pendulous. Central nervous system: Alert and oriented.  Unable to move lower extremities.  Extremely debilitated. Skin: No  rashes, lesions or ulcers  A skin tear at right flank , excoriations but no evidence of cellulitis or abscess     Excoriations with some blanching left buttock, excoriations of the folds of skins.        Data Reviewed: I have personally reviewed following labs and imaging studies  CBC: Recent Labs  Lab 08/22/22 2341 08/24/22 0600  WBC 13.3* 7.2  NEUTROABS 10.1*  --   HGB 11.5* 12.1*  HCT 38.6* 39.4  MCV 95.3 93.8  PLT 259 231   Basic Metabolic Panel: Recent Labs  Lab 08/22/22 2341 08/24/22 0600  NA 134* 139  K 4.7 3.8  CL 99 100  CO2 24 27  GLUCOSE 142* 138*  BUN 12 9  CREATININE 0.76 0.62  CALCIUM 8.4* 8.7*   GFR: Estimated Creatinine Clearance: 160.4 mL/min (by C-G formula based on SCr of 0.62 mg/dL). Liver Function Tests: Recent Labs  Lab 08/22/22 2341  AST 22  ALT 21  ALKPHOS 65  BILITOT 0.4  PROT 7.5  ALBUMIN 3.2*   No results for input(s): "LIPASE", "AMYLASE" in the last 168 hours. No results for input(s): "AMMONIA" in the last 168 hours. Coagulation Profile: Recent Labs  Lab 08/22/22 2341  INR 1.0   Cardiac Enzymes: No results for input(s): "CKTOTAL", "CKMB", "CKMBINDEX", "TROPONINI" in the last 168 hours. BNP (last 3 results) No results for input(s): "PROBNP" in the last 8760 hours. HbA1C: No results for input(s): "HGBA1C" in the last 72 hours. CBG: Recent Labs  Lab 08/23/22 0831 08/23/22 1221 08/23/22 1617 08/23/22 2017 08/24/22 0747  GLUCAP 100* 97 130* 112* 109*   Lipid Profile: No results for input(s): "CHOL", "HDL", "LDLCALC", "TRIG", "CHOLHDL", "LDLDIRECT" in the last 72 hours. Thyroid Function Tests: No results for input(s): "TSH", "T4TOTAL", "FREET4", "T3FREE", "THYROIDAB" in the last 72 hours. Anemia Panel: No results for input(s): "VITAMINB12", "FOLATE", "FERRITIN", "TIBC", "IRON", "RETICCTPCT" in the last 72 hours. Sepsis Labs: Recent Labs  Lab 08/22/22 2345 08/23/22 0250  LATICACIDVEN 2.7* 2.0*    Recent  Results (from the past 240 hour(s))  Blood Culture (routine x 2)     Status: None (Preliminary result)   Collection Time: 08/22/22 11:45 PM   Specimen: BLOOD LEFT HAND  Result Value Ref Range Status   Specimen Description   Final    BLOOD LEFT HAND BOTTLES DRAWN AEROBIC AND ANAEROBIC Performed at [facility], [address], [place], [place] [address]    Special Requests   Final    Blood Culture adequate volume Performed at [facility], [address], [place], [place] [address]    Culture   Final    NO GROWTH < 24 HOURS Performed at [facility], [address] [place], [place] [address]    Report Status PENDING  Incomplete  Blood Culture (routine x 2)     Status: None (Preliminary result)   Collection Time: 08/23/22  1:35 AM   Specimen: BLOOD RIGHT FOREARM  Result Value Ref Range Status   Specimen Description   Final    BLOOD RIGHT FOREARM BOTTLES DRAWN AEROBIC AND ANAEROBIC Performed at [facility],  [address], [place], [place] [address]    Special Requests   Final    Blood Culture adequate volume Performed at [facility], [address], [place], [place] [address]    Culture   Final    NO GROWTH < 12 HOURS Performed at [facility], [address] [place], [place] [address]    Report Status PENDING  Incomplete         Radiology Studies: CT Renal Stone Study  Result Date: 08/23/2022 CLINICAL DATA:  Abdominal/flank pain with stone suspected EXAM: CT ABDOMEN AND PELVIS WITHOUT CONTRAST TECHNIQUE: Multidetector CT imaging of the abdomen and pelvis was performed following the standard protocol without IV contrast. RADIATION DOSE REDUCTION: This exam was performed according to the departmental dose-optimization program which includes automated exposure control, adjustment of the mA and/or kV according to patient size and/or use of iterative reconstruction technique. COMPARISON:   03/31/2022 FINDINGS: Lower chest: Symmetric gynecomastia. Volume loss and opacity in the right lower lobe with some superimposed high-density that is likely calcification when compared to prior. Coronary atheromatous calcification Hepatobiliary: No focal liver abnormality.Layering gallstones. No evidence of biliary inflammation or biliary obstruction Pancreas: Diffuse fatty infiltration Spleen: Unremarkable. Adrenals/Urinary Tract: Negative adrenals. No hydronephrosis. Multiple renal calculi measuring up to 14 mm on the right. Two left renal calculi measuring up to 3 mm. Distended bladder but improved wall thickening. Small dependent bladder calculi. Stomach/Bowel: Postoperative stomach. Stool distended rectum measuring nearly 9 cm, but no generalized stool retention. Vascular/Lymphatic: No acute vascular abnormality. No mass or adenopathy. Reproductive:No pathologic findings. Other: No ascites or pneumoperitoneum.  Right abdominal wall laxity. Musculoskeletal: L3-4 and L4-5 fusion with solid arthrodesis. Intrathecalcatheter with pump in the left abdominal wall. IMPRESSION: 1. Bilateral nonobstructing nephrolithiasis.  No ureteral calculus. 2. Distended bladder with calculi, correlate for urinary retention. 3. Right lower lobe atelectasis. 4. Cholelithiasis. 5. Stool distended rectum. Electronically Signed   By: Tiburcio Pea M.D.   On: 08/23/2022 05:25   DG Chest Port 1 View  Result Date: 08/23/2022 CLINICAL DATA:  Possible sepsis EXAM: PORTABLE CHEST 1 VIEW COMPARISON:  03/31/2022 FINDINGS: Cardiac shadow is again enlarged. Patient is rotated to the right accentuating the mediastinal markings. No focal infiltrate or effusion is seen. No bony abnormality is noted. IMPRESSION: No acute abnormality noted. Electronically Signed   By: Alcide Clever M.D.   On: 08/23/2022 00:12   CT HEAD WO CONTRAST ( )  Result Date: 08/23/2022 CLINICAL DATA:  Altered mental status EXAM: CT HEAD WITHOUT CONTRAST TECHNIQUE:  Contiguous axial images were obtained from the base of the skull through the vertex without intravenous contrast. RADIATION DOSE REDUCTION: This exam was performed according to the departmental dose-optimization program which includes automated exposure control, adjustment of the mA and/or kV according to patient size and/or use of iterative reconstruction technique. COMPARISON:  03/31/2022 FINDINGS: Brain: No evidence of acute infarction, hemorrhage, hydrocephalus, extra-axial collection or mass lesion/mass effect. Vascular: No hyperdense vessel or unexpected calcification. Skull: Normal. Negative for fracture or focal lesion. Sinuses/Orbits: No acute finding. Other: None. IMPRESSION: No acute intracranial abnormality noted. Electronically Signed   By: Alcide Clever M.D.   On: 08/23/2022 00:11        Scheduled Meds:  enoxaparin (LOVENOX) injection  0.5 mg/kg Subcutaneous Q24H   insulin aspart  0-15 Units Subcutaneous TID WC   insulin aspart  0-5 Units Subcutaneous QHS   nystatin   Topical BID   Continuous Infusions:  cefTRIAXone (ROCEPHIN)  IV 2  g (08/23/22 2139)   vancomycin 2,000 mg (08/23/22 2326)     LOS: 1 day    Time spent: 35 minutes    Dorcas Carrow, MD [facility] Pager [phone]

## 2022-08-25 DIAGNOSIS — E861 Hypovolemia: Secondary | ICD-10-CM | POA: Diagnosis not present

## 2022-08-25 LAB — GLUCOSE, CAPILLARY
Glucose-Capillary: 110 mg/dL — ABNORMAL HIGH (ref 70–99)
Glucose-Capillary: 95 mg/dL (ref 70–99)

## 2022-08-25 LAB — CULTURE, BLOOD (ROUTINE X 2)

## 2022-08-25 NOTE — Progress Notes (Signed)
Patient being discharged from home. All discharge instructions reviewed with patient and wife Berton Dresser who is patient's Health Care POA. Both verbalized a full understanding. Patient's wife is his ride home.

## 2022-08-25 NOTE — Discharge Summary (Signed)
Physician Discharge Summary  DOYNE BARCROFT UJW:[id number] DOB: [birth year] DOA: 08/22/2022  PCP: [name], MD  Admit date: 08/22/2022 Discharge date: 08/25/2022  Admitted From: Home Disposition: Home with home health  Recommendations for Outpatient Follow-up:  Follow up with PCP in 1-2 weeks Follow-up with your pain management clinic  Home Health: PT Equipment/Devices: Present at home  Discharge Condition: Stable CODE STATUS: Full code Diet recommendation: Low-salt diet  Discharge summary: 57 year old with type 2 diabetes, morbid obesity, chronic diastolic dysfunction, COPD, spine injury and chronic pain management, bedbound status brought from home with altered mental status.  According to the family, he was confused and was complaining of seeing people in his house that were not there. Patient took morphine 15 mg, Dilaudid 4 mg and according to the reports he had 10 beers to drink.  In the emergency room his blood pressure was 60s responded to IV fluids.  Patient does not remember details. Recently admitted at [facility] for buttock cellulitis and intertrigo discharged on clotrimazole, fluconazole and linezolid however did not complete.  In the ER code sepsis was called due to low blood pressures, resuscitated, stabilized and transferred to hospital for admission.  # Suspected sepsis present on admission due to hypotension, hypovolemia and lactic acidosis. Sepsis ruled out.  Presentation is likely related to hypovolemia than sepsis.  Received vancomycin and Rocephin.  Cultures are negative.  No source of infection.  Antibiotics discontinued.  Currently stabilized.   # Slurred speech/confusion and hypovolemia, acute metabolic encephalopathy: This is likely related to multiple doses of narcotics, he takes morphine and Dilaudid both from pain management clinic.  Patient is also on Zanaflex.  On top of this he drank 10 cans of beer. Mental status has improved now.  Blood  pressure has improved. Gradually resume his pain medications, avoid polypharmacy.  Avoid alcohol and this was discussed in detail.  # Chronic pain management, as above.   # Type 2 diabetes: Diet controlled at home.  Not on treatment.     # Morbid obesity/diastolic dysfunction/COPD: Chronic and stable.   # Ambulatory dysfunction/bedbound status: He is bedbound and wheelchair-bound.   He will benefit with physical therapy at home to continue to learn transfers, safe mobility.  Intertrigo, skin laceration: Patient has significant moisture related injury on his back and abdominal folds.  There is no evidence of cellulitis or abscess.  Right flank has a small skin tear. Detailed wound care instructions attached.  No need for antibiotics.  Topical antifungal therapy, barrier cream and protection.  Stable for discharge.  Discharge Diagnoses:  Principal Problem:   Cellulitis Active Problems:   Type 2 diabetes mellitus with morbid obesity (HCC)   Sepsis due to cellulitis (HCC)   Chronic diastolic heart failure (HCC)   Hyponatremia    Discharge Instructions  Discharge Instructions     Diet - low sodium heart healthy   Complete by: As directed    Discharge instructions   Complete by: As directed    Do not take any non prescription medications or alcohol with your pain medicines   Discharge wound care:   Complete by: As directed    1. Measure and cut length of InterDry to fit in skin folds that have skin breakdown  Tuck InterDry fabric into skin folds in a single layer, allow for 2 inches of overhang from skin edges to allow for wicking to occur May remove to bathe; dry area thoroughly and then tuck into affected areas again  Do not apply any  creams or ointments when using InterDry DO NOT THROW AWAY FOR 5 DAYS unless soiled with stool DO NOT [facility] product, this will inactivate the silver in the material  New sheet of Interdry should be applied after 5 days of use if patient continues  to have skin breakdown Discontinue use of current sheet after 5/18 2. Apply barrier cream and antifungal powder to buttocks PRN with each turning and cleaning session  08/25/22 0810   Increase activity slowly   Complete by: As directed       Allergies as of 08/25/2022       Reactions   Tape Other (See Comments)   Blisters - tolerates paper tape well        Medication List     STOP taking these medications    calcium carbonate 600 MG Tabs tablet Commonly known as: Calcium 600   cephALEXin 500 MG capsule Commonly known as: KEFLEX   cyclobenzaprine 10 MG tablet Commonly known as: FLEXERIL   ondansetron 4 MG disintegrating tablet Commonly known as: ZOFRAN-ODT   pregabalin 150 MG capsule Commonly known as: LYRICA   torsemide 20 MG tablet Commonly known as: DEMADEX   Vitamin D3 125 MCG (5000 UT) Caps       TAKE these medications    acetaminophen 500 MG tablet Commonly known as: TYLENOL Take 1,000 mg by mouth every 6 (six) hours as needed for mild pain or moderate pain.   aspirin EC 81 MG tablet Take 81 mg by mouth daily.   clotrimazole-betamethasone cream Commonly known as: LOTRISONE Apply to affected area 2 times day for one week What changed:  how much to take how to take this when to take this reasons to take this additional instructions   DULoxetine 30 MG capsule Commonly known as: CYMBALTA Take 90 mg by mouth daily.   esomeprazole 40 MG capsule Commonly known as: NEXIUM Take 1 capsule (40 mg total) by mouth daily. What changed:  when to take this reasons to take this   furosemide 40 MG tablet Commonly known as: LASIX Take 40 mg by mouth daily as needed for fluid or edema.   HYDROmorphone 4 MG tablet Commonly known as: DILAUDID Take 1 tablet (4 mg) by mouth 4 times a day as needed What changed:  how much to take how to take this when to take this reasons to take this Another medication with the same name was removed. Continue taking  this medication, and follow the directions you see here.   InterDry 96"[id number]" Shee Apply 1 application  topically 2 (two) times daily.   morphine 30 MG 12 hr tablet Commonly known as: MS CONTIN Take 1 tablet (30 mg total) by mouth every 12 (twelve) hours. Must last 30 days. Do not break tablet   naloxone 4 MG/0.1ML Liqd nasal spray kit Commonly known as: NARCAN Place 1 spray into the nose as needed for up to 365 doses (for opioid-induced respiratory depresssion). In case of emergency (overdose), spray once into each nostril. If no response within 3 minutes, repeat application and call 911.   nystatin powder Commonly known as: MYCOSTATIN/NYSTOP Apply topically 2 (two) times daily.   promethazine 25 MG tablet Commonly known as: PHENERGAN Take 25-50 mg by mouth every 6 (six) hours as needed for nausea or vomiting.   tiZANidine 4 MG tablet Commonly known as: Zanaflex Take 1 tablet (4 mg total) by mouth every 8 (eight) hours as needed for muscle spasms.  Discharge Care Instructions  (From admission, onward)           Start     Ordered   08/25/22 0000  Discharge wound care:       Comments: 1. Measure and cut length of InterDry to fit in skin folds that have skin breakdown  Tuck InterDry fabric into skin folds in a single layer, allow for 2 inches of overhang from skin edges to allow for wicking to occur May remove to bathe; dry area thoroughly and then tuck into affected areas again  Do not apply any creams or ointments when using InterDry DO NOT THROW AWAY FOR 5 DAYS unless soiled with stool DO NOT [facility] product, this will inactivate the silver in the material  New sheet of Interdry should be applied after 5 days of use if patient continues to have skin breakdown Discontinue use of current sheet after 5/18 2. Apply barrier cream and antifungal powder to buttocks PRN with each turning and cleaning session  08/25/22 0810   08/25/22 0933             Allergies  Allergen Reactions   Tape Other (See Comments)    Blisters - tolerates paper tape well    Consultations: None   Procedures/Studies: CT Renal Stone Study  Result Date: 08/23/2022 CLINICAL DATA:  Abdominal/flank pain with stone suspected EXAM: CT ABDOMEN AND PELVIS WITHOUT CONTRAST TECHNIQUE: Multidetector CT imaging of the abdomen and pelvis was performed following the standard protocol without IV contrast. RADIATION DOSE REDUCTION: This exam was performed according to the departmental dose-optimization program which includes automated exposure control, adjustment of the mA and/or kV according to patient size and/or use of iterative reconstruction technique. COMPARISON:  03/31/2022 FINDINGS: Lower chest: Symmetric gynecomastia. Volume loss and opacity in the right lower lobe with some superimposed high-density that is likely calcification when compared to prior. Coronary atheromatous calcification Hepatobiliary: No focal liver abnormality.Layering gallstones. No evidence of biliary inflammation or biliary obstruction Pancreas: Diffuse fatty infiltration Spleen: Unremarkable. Adrenals/Urinary Tract: Negative adrenals. No hydronephrosis. Multiple renal calculi measuring up to 14 mm on the right. Two left renal calculi measuring up to 3 mm. Distended bladder but improved wall thickening. Small dependent bladder calculi. Stomach/Bowel: Postoperative stomach. Stool distended rectum measuring nearly 9 cm, but no generalized stool retention. Vascular/Lymphatic: No acute vascular abnormality. No mass or adenopathy. Reproductive:No pathologic findings. Other: No ascites or pneumoperitoneum.  Right abdominal wall laxity. Musculoskeletal: L3-4 and L4-5 fusion with solid arthrodesis. Intrathecalcatheter with pump in the left abdominal wall. IMPRESSION: 1. Bilateral nonobstructing nephrolithiasis.  No ureteral calculus. 2. Distended bladder with calculi, correlate for urinary retention. 3. Right  lower lobe atelectasis. 4. Cholelithiasis. 5. Stool distended rectum. Electronically Signed   By: Tiburcio Pea M.D.   On: 08/23/2022 05:25   DG Chest Port 1 View  Result Date: 08/23/2022 CLINICAL DATA:  Possible sepsis EXAM: PORTABLE CHEST 1 VIEW COMPARISON:  03/31/2022 FINDINGS: Cardiac shadow is again enlarged. Patient is rotated to the right accentuating the mediastinal markings. No focal infiltrate or effusion is seen. No bony abnormality is noted. IMPRESSION: No acute abnormality noted. Electronically Signed   By: Alcide Clever M.D.   On: 08/23/2022 00:12   CT HEAD WO CONTRAST ( )  Result Date: 08/23/2022 CLINICAL DATA:  Altered mental status EXAM: CT HEAD WITHOUT CONTRAST TECHNIQUE: Contiguous axial images were obtained from the base of the skull through the vertex without intravenous contrast. RADIATION DOSE REDUCTION: This exam was performed according to the departmental dose-optimization  program which includes automated exposure control, adjustment of the mA and/or kV according to patient size and/or use of iterative reconstruction technique. COMPARISON:  03/31/2022 FINDINGS: Brain: No evidence of acute infarction, hemorrhage, hydrocephalus, extra-axial collection or mass lesion/mass effect. Vascular: No hyperdense vessel or unexpected calcification. Skull: Normal. Negative for fracture or focal lesion. Sinuses/Orbits: No acute finding. Other: None. IMPRESSION: No acute intracranial abnormality noted. Electronically Signed   By: Alcide Clever M.D.   On: 08/23/2022 00:11   (Echo, Carotid, EGD, Colonoscopy, ERCP)    Subjective: Patient seen and examined.  No overnight events.  Denies any complaints.  Remains afebrile and normal mentation since admission.   Discharge Exam: Vitals:   08/24/22 2056 08/25/22 0504  BP: 111/68 113/65  Pulse: 73 73  Resp: 16 18  Temp: 98 F (36.7 C) 98.4 F (36.9 C)  SpO2: 100% 100%   Vitals:   08/24/22 0526 08/24/22 1430 08/24/22 2056 08/25/22 0504   BP: 120/61 130/68 111/68 113/65  Pulse: 71 73 73 73  Resp:   16 18  Temp: 98 F (36.7 C) 98.2 F (36.8 C) 98 F (36.7 C) 98.4 F (36.9 C)  TempSrc: Oral Oral Oral Oral  SpO2: 100% (!) 89% 100% 100%  Weight:      Height:        General: Pt is alert, awake, not in acute distress, on room air today. Cardiovascular: RRR, S1/S2 +, no rubs, no gallops Respiratory: CTA bilaterally, no wheezing, no rhonchi Abdominal: Soft, NT, ND, bowel sounds +, obese and pendulous. Extremities: no edema, no cyanosis         The results of significant diagnostics from this hospitalization (including imaging, microbiology, ancillary and laboratory) are listed below for reference.     Microbiology: Recent Results (from the past 240 hour(s))  Urine Culture     Status: None   Collection Time: 08/22/22  1:11 AM   Specimen: Urine, Random  Result Value Ref Range Status   Specimen Description   Final    URINE, RANDOM Performed at [facility], [address] [place], [place] [address]    Special Requests   Final    NONE Reflexed from [id number] Performed at [facility], [address], [place], [place] [address]    Culture   Final    NO GROWTH Performed at [facility], [address] [place], [place] [address]    Report Status 08/24/2022 FINAL  Final  Blood Culture (routine x 2)     Status: None (Preliminary result)   Collection Time: 08/22/22 11:45 PM   Specimen: BLOOD LEFT HAND  Result Value Ref Range Status   Specimen Description   Final    BLOOD LEFT HAND BOTTLES DRAWN AEROBIC AND ANAEROBIC Performed at [facility], [address] [place], [place] [address]    Special Requests   Final    Blood Culture adequate volume Performed at [facility], [address] [place], [place] [address]    Culture   Final    NO GROWTH 2 DAYS Performed at [facility], [address] [place], [place]  [address]    Report Status PENDING  Incomplete  Blood Culture (routine x 2)     Status: None (Preliminary result)   Collection Time: 08/23/22  1:35 AM   Specimen: BLOOD RIGHT FOREARM  Result Value Ref Range Status   Specimen Description   Final    BLOOD RIGHT FOREARM BOTTLES  DRAWN AEROBIC AND ANAEROBIC Performed at [facility], [address] [place], [place] [address]    Special Requests   Final    Blood Culture adequate volume Performed at [facility], [address], [place], [place] [address]    Culture   Final    NO GROWTH 2 DAYS Performed at [facility], [address] [place], [place] [address]    Report Status PENDING  Incomplete     Labs: BNP (last 3 results) No results for input(s): "BNP" in the last 8760 hours. Basic Metabolic Panel: Recent Labs  Lab 08/22/22 2341 08/24/22 0600  NA 134* 139  K 4.7 3.8  CL 99 100  CO2 24 27  GLUCOSE 142* 138*  BUN 12 9  CREATININE 0.76 0.62  CALCIUM 8.4* 8.7*   Liver Function Tests: Recent Labs  Lab 08/22/22 2341  AST 22  ALT 21  ALKPHOS 65  BILITOT 0.4  PROT 7.5  ALBUMIN 3.2*   No results for input(s): "LIPASE", "AMYLASE" in the last 168 hours. No results for input(s): "AMMONIA" in the last 168 hours. CBC: Recent Labs  Lab 08/22/22 2341 08/24/22 0600  WBC 13.3* 7.2  NEUTROABS 10.1*  --   HGB 11.5* 12.1*  HCT 38.6* 39.4  MCV 95.3 93.8  PLT 259 231   Cardiac Enzymes: No results for input(s): "CKTOTAL", "CKMB", "CKMBINDEX", "TROPONINI" in the last 168 hours. BNP: Invalid input(s): "POCBNP" CBG: Recent Labs  Lab 08/23/22 2017 08/24/22 0747 08/24/22 1201 08/24/22 1653 08/24/22 2058  GLUCAP 112* 109* 109* 98 113*   D-Dimer No results for input(s): "DDIMER" in the last 72 hours. Hgb A1c No results for input(s): "HGBA1C" in the last 72 hours. Lipid Profile No results for input(s): "CHOL", "HDL", "LDLCALC", "TRIG", "CHOLHDL", "LDLDIRECT" in the last 72  hours. Thyroid function studies No results for input(s): "TSH", "T4TOTAL", "T3FREE", "THYROIDAB" in the last 72 hours.  Invalid input(s): "FREET3" Anemia work up No results for input(s): "VITAMINB12", "FOLATE", "FERRITIN", "TIBC", "IRON", "RETICCTPCT" in the last 72 hours. Urinalysis    Component Value Date/Time   COLORURINE YELLOW 08/22/2022 0111   APPEARANCEUR HAZY (A) 08/22/2022 0111   LABSPEC 1.010 08/22/2022 0111   PHURINE 5.0 08/22/2022 0111   GLUCOSEU NEGATIVE 08/22/2022 0111   HGBUR NEGATIVE 08/22/2022 0111   BILIRUBINUR NEGATIVE 08/22/2022 0111   KETONESUR NEGATIVE 08/22/2022 0111   PROTEINUR NEGATIVE 08/22/2022 0111   UROBILINOGEN 0.2 08/13/2010 1206   NITRITE NEGATIVE 08/22/2022 0111   LEUKOCYTESUR MODERATE (A) 08/22/2022 0111   Sepsis Labs Recent Labs  Lab 08/22/22 2341 08/24/22 0600  WBC 13.3* 7.2   Microbiology Recent Results (from the past 240 hour(s))  Urine Culture     Status: None   Collection Time: 08/22/22  1:11 AM   Specimen: Urine, Random  Result Value Ref Range Status   Specimen Description   Final    URINE, RANDOM Performed at [facility], [address] [place], [place] [address]    Special Requests   Final    NONE Reflexed from [id number] Performed at [facility], [address], [place], [place] [address]    Culture   Final    NO GROWTH Performed at [facility], [address] [place], [place] [address]    Report Status 08/24/2022 FINAL  Final  Blood Culture (routine x 2)     Status: None (Preliminary result)   Collection Time: 08/22/22 11:45 PM   Specimen: BLOOD  LEFT HAND  Result Value Ref Range Status   Specimen Description   Final    BLOOD LEFT HAND BOTTLES DRAWN AEROBIC AND ANAEROBIC Performed at [facility], [address], [place], [place] [address]    Special Requests   Final    Blood Culture adequate volume Performed at [facility], [address], [place], [place] [address]    Culture   Final    NO GROWTH 2 DAYS Performed at [facility], [address], [place], [place] [address]    Report Status PENDING  Incomplete  Blood Culture (routine x 2)     Status: None (Preliminary result)   Collection Time: 08/23/22  1:35 AM   Specimen: BLOOD RIGHT FOREARM  Result Value Ref Range Status   Specimen Description   Final    BLOOD RIGHT FOREARM BOTTLES DRAWN AEROBIC AND ANAEROBIC Performed at [facility], [address], [place], [place] [address]    Special Requests   Final    Blood Culture adequate volume Performed at [facility], [address] [place], [place] [address]    Culture   Final    NO GROWTH 2 DAYS Performed at [facility], [address], [place], [place] [address]    Report Status PENDING  Incomplete     Time coordinating discharge: 35 minutes  SIGNED:   Dorcas Carrow, MD  [facility] 08/25/2022, 9:33 AM

## 2022-08-25 NOTE — Consult Note (Addendum)
WOC Nurse Consult Note: Reason for Consult: Consult requested for rash and moisture associated skin damage to bilat buttocks, lower back, and abd skin folds. Performed remotely after review of progress notes and photos in the EMR.  Wound type: Bialt buittocks with scattered red macular papular rash; abd skin folds with partial thickness skin loss and red moist macerated skin. Appearance is consistent with probable moisture associated skin damage and intertrigo.   ICD-10 CM Codes for Irritant Dermatitis L24A9 - Due to friction or contact with other specified body fluids  Dressing procedure/placement/frequency: Topical treatment orders provided for bedside nurses to absorb drainage and promote drying and healing as follows:   1. Measure and cut length of InterDry to fit in skin folds that have skin breakdown  Tuck InterDry fabricHart [place] # [phone])  into skin folds in a single layer, allow for 2 inches of overhang from skin edges to allow for wicking to occur May remove to bathe; dry area thoroughly and then tuck into affected areas again  Do not apply any creams or ointments when using InterDry DO NOT THROW AWAY FOR 5 DAYS unless soiled with stool DO NOT [facility] product, this will inactivate the silver in the material  New sheet of Interdry should be applied after 5 days of use if patient continues to have skin breakdown Discontinue use of current sheet after 5/18  2. Apply barrier cream and antifungal powder to buttocks PRN with each turning and cleaning session  Please re-consult if further assistance is needed.  Thank-you,  Cammie Mcgee MSN, RN, CWOCN, [place], CNS [phone]

## 2022-08-25 NOTE — Progress Notes (Signed)
Patient states his wife will come over here at noon to pick him up.

## 2022-08-25 NOTE — TOC Initial Note (Signed)
Transition of Care [facility]) - Initial/Assessment Note    Patient Details  Name: [name] MRN: [id number] Date of Birth: 1965-10-06  [facility] CM/SW Contact:    Beckie Busing, RN Phone Number:[phone]  08/25/2022, 11:54 AM  Clinical Narrative:                 [facility] consulted for patient for home health needs. Patient has recommendation for PT. Cm at bedside introduced self and explained consult for [facility] PT. Patient declines PT at this time stating that this will most likely be a workers comp case ans that he doesn't want to file this on his insurance.. Patient states that he will wait. Cm offered to set up outpatient therapy but patient also declines outpatient therapy. There are no other TOC needs noted. TOC will sign off.     Expected Discharge Plan: Home w Home Health Services Barriers to Discharge: No Barriers Identified   Patient Goals and CMS Choice Patient states their goals for this hospitalization and ongoing recovery are:: Wants to get better CMS Medicare.gov Compare Post Acute Care list provided to:: Patient Choice offered to / list presented to : Patient [place] ownership interest in [facility].provided to::  (n/a)    Expected Discharge Plan and Services In-house Referral: NA Discharge Planning Services: CM Consult Post Acute Care Choice: Home Health (refused) Living arrangements for the past 2 months: Single Family Home Expected Discharge Date: 08/25/22               DME Arranged: N/A DME Agency: NA       HH Arranged: Refused HH HH Agency: NA        Prior Living Arrangements/Services Living arrangements for the past 2 months: Single Family Home Lives with:: Spouse Patient language and need for interpreter reviewed:: Yes Do you feel safe going back to the place where you live?: Yes      Need for Family Participation in Patient Care: Yes (Comment) Care giver support system in place?: Yes (comment) Current home services:  Other (comment) (none) Criminal Activity/Legal Involvement Pertinent to Current Situation/Hospitalization: No - Comment as needed  Activities of Daily Living      Permission Sought/Granted Permission sought to share information with : Family Supports Permission granted to share information with : No              Emotional Assessment Appearance:: Appears stated age Attitude/Demeanor/Rapport: Gracious Affect (typically observed): Pleasant Orientation: : Oriented to Self, Oriented to Place, Oriented to  Time, Oriented to Situation Alcohol / Substance Use: Not Applicable Psych Involvement: No (comment)  Admission diagnosis:  Cellulitis [L03.90] Altered mental status, unspecified altered mental status type [R41.82] Patient Active Problem List   Diagnosis Date Noted   Cellulitis 08/23/2022   Acute metabolic encephalopathy 04/01/2022   Complicated UTI (urinary tract infection) 04/01/2022   Sepsis due to cellulitis (HCC) 03/31/2022   Candidal intertrigo 03/31/2022   Opioid use disorder 01/06/2022   Genetic testing (CPY450 2D6: Normal activity) 11/21/2021   Abnormal MRI, lumbar spine (11/21/2021) 11/21/2021   Displacement of spinal infusion catheter 09/19/2021   Malfunction of intrathecal infusion pump (HCC) 09/19/2021   End of battery life of intrathecal infusion pump 09/19/2021   Irritable bowel syndrome 07/16/2021   Mild persistent asthma 07/16/2021   Moderate major depression, single episode (HCC) 07/16/2021   Pure hypercholesterolemia 07/16/2021   Urinary incontinence 07/16/2021   Chronic respiratory failure (HCC) 07/16/2021   Constipation 07/16/2021   Sleep apnea  07/16/2021   Type 2 diabetes mellitus with other specified complication (HCC) 07/16/2021   Epidural fibrosis 11/22/2020   Weakness of lower extremities (Bilateral) 11/22/2020   Weakness of back 11/22/2020   Lumbosacral spondylosis with radiculopathy 11/22/2020   Physical deconditioning 11/22/2020   Type 2  diabetes mellitus with diabetic polyneuropathy, without long-term current use of insulin (HCC) 11/22/2020   Type 2 diabetes mellitus with morbid obesity (HCC) 11/22/2020   Physical tolerance to opiate drug 11/22/2020   Unable to ambulate 11/22/2020   Chronic use of opiate for therapeutic purpose 07/19/2020   Uncomplicated opioid dependence (HCC) 03/15/2020   Restrictive airway disease 01/24/2020   Elevated blood-pressure reading, without diagnosis of hypertension 04/19/2019   Preoperative testing 09/13/2018   Altered mental status 07/11/2018   Fever    Myoclonic jerking    Obesity, morbid (more than 100 lbs over ideal weight or BMI > 40) (HCC) 06/30/2018   DDD (degenerative disc disease), thoracic 06/30/2018   Osteoarthritis of lumbar spine 06/30/2018   History of acute alcoholic pancreatitis 06/30/2018   Male hypogonadism 06/30/2018   Vitamin D deficiency 06/30/2018   Chronic hyperglycemia 06/30/2018   Alcoholism (HCC) 06/18/2018   Alcohol-induced acute pancreatitis without infection or necrosis 06/18/2018   Acute alcoholic pancreatitis 06/11/2018   Hypokalemia 06/11/2018   Hyponatremia 06/11/2018   Chronic diastolic heart failure (HCC) 06/11/2018   Nausea and vomiting 06/10/2018   Abdominal pain 06/10/2018   Encounter for screening colonoscopy 06/10/2018   Morbid obesity with BMI of 45.0-49.9, adult (HCC) 05/25/2018   Secondary osteoarthritis of multiple sites 05/25/2018   Osteoarthritis of hip (Right) 05/25/2018   Encounter for adjustment or management of infusion pump 04/01/2018   Lumbar spondylosis 01/26/2018   Spondylosis without myelopathy or radiculopathy, lumbosacral region 12/02/2017   Lumbar facet syndrome (Bilateral) (R>L) 12/02/2017   Chronic low back pain (1ry area of Pain) (Bilateral) (R>L) w/o sciatica 12/02/2017   Chronic knee pain after total replacement (Left) 12/01/2017   Chronic musculoskeletal pain 11/17/2017   Muscle spasticity 11/17/2017    Gastroesophageal reflux disease 11/17/2017   Neurogenic pain 11/17/2017   Drug-induced nausea and vomiting 11/17/2017   Therapeutic opioid-induced constipation (OIC) 11/17/2017   Chronic pain syndrome 11/09/2017   Long term prescription benzodiazepine use 11/09/2017   Long term prescription opiate use 11/09/2017   Opiate use (140 MME/day) (oral) 11/09/2017   Long-term use of high-risk medication 11/09/2017   Pharmacologic therapy 11/09/2017   Disorder of skeletal system 11/09/2017   Problems influencing health status 11/09/2017   Chronic nausea s/p bariatric surgery 11/09/2017   Presence of intrathecal pump ([employer]) 11/09/2017   Encounter for interrogation of infusion pump 11/09/2017   Chronic foot pain (Right) 11/09/2017   Chronic hip pain (Right) 11/09/2017   Old tear of medial meniscus of knee (Left) 11/09/2017   Failed back surgical syndrome 11/09/2017   Chronic low back pain (Bilateral) w/ sciatica (Bilateral) 11/09/2017   Chronic lower extremity pain (2ry area of Pain) (Bilateral) (L>R) 11/09/2017   Chronic lower extremity radicular pain 11/09/2017   Chronic shoulder pain (Bilateral) 11/09/2017   History of total knee replacement (Left) 11/09/2017   History of arthroscopic surgery of shoulder (Bilateral) 11/09/2017   History of bariatric surgery (September 2016) 01/08/2015   OSA complicated by mild polycythemia  10/21/2014   Polycythemia, secondary 10/21/2014   Dyspnea 10/20/2014   S/P rotator cuff repair (Bilateral) 08/18/2013   DDD (degenerative disc disease), lumbosacral 10/20/2006   PCP:  [name], MD Pharmacy:   [facility] -  [place], [place] - [address] [place] [address] Phone: [phone] Fax: [phone]     Social Determinants of Health (SDOH) Social History: SDOH Screenings   Food Insecurity: Food Insecurity Present (04/01/2022)  Housing: Low Risk  (04/01/2022)  Transportation Needs: No Transportation Needs  (04/01/2022)  Utilities: Not At Risk (04/01/2022)  Depression (PHQ2-9): Low Risk  (03/19/2021)  Tobacco Use: High Risk (08/22/2022)   SDOH Interventions:     Readmission Risk Interventions    08/25/2022   11:46 AM  Readmission Risk Prevention Plan  Transportation Screening Complete  PCP or Specialist Appt within 3-5 Days Complete  HRI or Home Care Consult Complete  Social Work Consult for Recovery Care Planning/Counseling Complete  Palliative Care Screening Not Applicable  Medication Review Oceanographer) Complete

## 2022-08-26 LAB — CULTURE, BLOOD (ROUTINE X 2): Culture: NO GROWTH

## 2022-09-05 DIAGNOSIS — M545 Low back pain, unspecified: Secondary | ICD-10-CM | POA: Diagnosis not present

## 2022-09-05 DIAGNOSIS — I5032 Chronic diastolic (congestive) heart failure: Secondary | ICD-10-CM | POA: Diagnosis not present

## 2022-09-05 DIAGNOSIS — I110 Hypertensive heart disease with heart failure: Secondary | ICD-10-CM | POA: Diagnosis not present

## 2022-09-05 DIAGNOSIS — R531 Weakness: Secondary | ICD-10-CM | POA: Diagnosis not present

## 2022-09-05 DIAGNOSIS — G894 Chronic pain syndrome: Secondary | ICD-10-CM | POA: Diagnosis not present

## 2022-09-05 DIAGNOSIS — J4489 Other specified chronic obstructive pulmonary disease: Secondary | ICD-10-CM | POA: Diagnosis not present

## 2022-09-05 DIAGNOSIS — E1140 Type 2 diabetes mellitus with diabetic neuropathy, unspecified: Secondary | ICD-10-CM | POA: Diagnosis not present

## 2022-09-05 DIAGNOSIS — J439 Emphysema, unspecified: Secondary | ICD-10-CM | POA: Diagnosis not present

## 2022-09-05 DIAGNOSIS — S31113D Laceration without foreign body of abdominal wall, right lower quadrant without penetration into peritoneal cavity, subsequent encounter: Secondary | ICD-10-CM | POA: Diagnosis not present

## 2022-09-13 ENCOUNTER — Other Ambulatory Visit: Payer: Self-pay

## 2022-09-13 ENCOUNTER — Encounter (HOSPITAL_COMMUNITY): Payer: Self-pay

## 2022-09-13 ENCOUNTER — Emergency Department (HOSPITAL_COMMUNITY)
Admission: EM | Admit: 2022-09-13 | Discharge: 2022-09-14 | Disposition: A | Discharge status: Home / Self Care | Payer: No Typology Code available for payment source | Attending: Emergency Medicine | Admitting: Emergency Medicine

## 2022-09-13 DIAGNOSIS — I509 Heart failure, unspecified: Secondary | ICD-10-CM | POA: Insufficient documentation

## 2022-09-13 DIAGNOSIS — M545 Low back pain, unspecified: Secondary | ICD-10-CM | POA: Insufficient documentation

## 2022-09-13 DIAGNOSIS — W06XXXA Fall from bed, initial encounter: Secondary | ICD-10-CM | POA: Insufficient documentation

## 2022-09-13 DIAGNOSIS — G8929 Other chronic pain: Secondary | ICD-10-CM | POA: Diagnosis not present

## 2022-09-13 DIAGNOSIS — Y990 Civilian activity done for income or pay: Secondary | ICD-10-CM | POA: Insufficient documentation

## 2022-09-13 DIAGNOSIS — J45909 Unspecified asthma, uncomplicated: Secondary | ICD-10-CM | POA: Insufficient documentation

## 2022-09-13 DIAGNOSIS — J449 Chronic obstructive pulmonary disease, unspecified: Secondary | ICD-10-CM | POA: Diagnosis not present

## 2022-09-13 DIAGNOSIS — Z7982 Long term (current) use of aspirin: Secondary | ICD-10-CM | POA: Insufficient documentation

## 2022-09-13 DIAGNOSIS — M549 Dorsalgia, unspecified: Secondary | ICD-10-CM | POA: Diagnosis not present

## 2022-09-13 DIAGNOSIS — R21 Rash and other nonspecific skin eruption: Secondary | ICD-10-CM | POA: Insufficient documentation

## 2022-09-13 NOTE — ED Provider Notes (Signed)
[place] EMERGENCY DEPARTMENT AT [facility] Provider Note   CSN: [id number] Arrival date & time: 09/13/22  2207     History {Add pertinent medical, surgical, social history, OB history to HPI:1} Chief Complaint  Patient presents with   Back Pain    [name] is a 57 y.o. male.  HPI     This is a 57 year old male who presents from home after falling out of bed.  Patient reports that he is mostly bedbound.  He was attempting to transfer from bed to his wheelchair after defecating on himself in bed.  He slid out of the bed onto the floor.  He states that he did not sustain any injury but was unable to get up on his own.  At baseline he has chronic back pain which is unchanged from prior.  He also is being treated for a skin yeast infection.  He states that EMS was called and that his wife requested transport because of him having defecated on himself and "needing to be cleaned up."  He does not have any requests for further evaluation at this time.  Home Medications Prior to Admission medications   Medication Sig Start Date End Date Taking? Authorizing Provider  acetaminophen (TYLENOL) 500 MG tablet Take 1,000 mg by mouth every 6 (six) hours as needed for mild pain or moderate pain.     [provider]  aspirin EC 81 MG tablet Take 81 mg by mouth daily.    [provider]  clotrimazole-betamethasone (LOTRISONE) cream Apply to affected area 2 times day for one week Patient taking differently: Apply 1 Application topically 2 (two) times daily as needed (rash). 10/31/21   Bethann Berkshire, MD  DULoxetine (CYMBALTA) 30 MG capsule Take 90 mg by mouth daily.    [provider]  esomeprazole (NEXIUM) 40 MG capsule Take 1 capsule (40 mg total) by mouth daily. Patient taking differently: Take 40 mg by mouth daily as needed (heart burn). 12/27/18 08/23/22  [name], MD  furosemide (LASIX) 40 MG tablet Take 40 mg by mouth daily as needed for fluid  or edema. Patient not taking: Reported on 08/23/2022    [provider]  HYDROmorphone (DILAUDID) 4 MG tablet Take 1 tablet (4 mg) by mouth 4 times a day as needed Patient taking differently: Take 4 mg by mouth every 6 (six) hours as needed for moderate pain or severe pain. 04/18/22     morphine (MS CONTIN) 30 MG 12 hr tablet Take 1 tablet (30 mg total) by mouth every 12 (twelve) hours. Must last 30 days. Do not break tablet 01/14/22 08/23/22  [name], MD  naloxone [facility]) nasal spray 4 mg/0.1 mL Place 1 spray into the nose as needed for up to 365 doses (for opioid-induced respiratory depresssion). In case of emergency (overdose), spray once into each nostril. If no response within 3 minutes, repeat application and call 911. 11/21/21 11/21/22  [name], MD  nystatin (MYCOSTATIN/NYSTOP) powder Apply topically 2 (two) times daily. Patient not taking: Reported on 08/23/2022 04/04/22   Rhetta Mura, MD  promethazine (PHENERGAN) 25 MG tablet Take 25-50 mg by mouth every 6 (six) hours as needed for nausea or vomiting.  05/26/19   [provider]  Skin Protectants, Misc. (INTERDRY 40"J811") SHEE Apply 1 application  topically 2 (two) times daily. Patient not taking: Reported on 08/23/2022 04/04/22   Rhetta Mura, MD  tiZANidine (ZANAFLEX) 4 MG tablet Take 1 tablet (4 mg total) by mouth every  8 (eight) hours as needed for muscle spasms. 10/20/19 08/23/22  [name], MD      Allergies    Tape    Review of Systems   Review of Systems  Constitutional:  Negative for fever.  Skin:  Positive for rash.  All other systems reviewed and are negative.   Physical Exam Updated Vital Signs BP 109/78   Pulse 100   Temp 98.7 F (37.1 C) (Oral)   Resp 18   Ht 1.803 m (5\' 11" )   Wt (!) 158.8 kg   SpO2 97%   BMI 48.82 kg/m  Physical Exam Vitals and nursing note reviewed.  Constitutional:      Appearance: He is well-developed.     Comments: Morbidly  obese  HENT:     Head: Normocephalic and atraumatic.  Eyes:     Pupils: Pupils are equal, round, and reactive to light.  Cardiovascular:     Rate and Rhythm: Normal rate and regular rhythm.     Heart sounds: Normal heart sounds. No murmur heard. Pulmonary:     Effort: Pulmonary effort is normal. No respiratory distress.     Breath sounds: Normal breath sounds. No wheezing.     Comments: Nasal cannula in place Abdominal:     Palpations: Abdomen is soft.     Tenderness: There is no abdominal tenderness. There is no rebound.  Musculoskeletal:     Cervical back: Neck supple.  Lymphadenopathy:     Cervical: No cervical adenopathy.  Skin:    Comments: Erythematous weeping rash under the right pannus, redness extends across the back, nontender  Neurological:     Mental Status: He is alert and oriented to person, place, and time.  Psychiatric:        Mood and Affect: Mood normal.     ED Results / Procedures / Treatments   Labs (all labs ordered are listed, but only abnormal results are displayed) Labs Reviewed - No data to display  EKG None  Radiology No results found.  Procedures Procedures  {Document cardiac monitor, telemetry assessment procedure when appropriate:1}  Medications Ordered in ED Medications - No data to display  ED Course/ Medical Decision Making/ A&P   {   Click here for ABCD2, HEART and other calculatorsREFRESH Note before signing :1}                          Medical Decision Making  ***  {Document critical care time when appropriate:1} {Document review of labs and clinical decision tools ie heart score, Chads2Vasc2 etc:1}  {Document your independent review of radiology images, and any outside records:1} {Document your discussion with family members, caretakers, and with consultants:1} {Document social determinants of health affecting pt's care:1} {Document your decision making why or why not admission, treatments were needed:1} Final Clinical  Impression(s) / ED Diagnoses Final diagnoses:  None    Rx / DC Orders ED Discharge Orders     None

## 2022-09-13 NOTE — ED Triage Notes (Signed)
BIBA from home for back pain. Pt reports that he slid down out off of bed trying to get up after defecating on himself. Hx of chronic back pain, CHF.

## 2022-09-14 NOTE — Discharge Instructions (Signed)
You were seen today after falling out of bed.  You had no acute complaints.  You were cleaned up and will be sent back home.

## 2022-09-16 DIAGNOSIS — R531 Weakness: Secondary | ICD-10-CM | POA: Diagnosis not present

## 2022-09-16 DIAGNOSIS — S31113D Laceration without foreign body of abdominal wall, right lower quadrant without penetration into peritoneal cavity, subsequent encounter: Secondary | ICD-10-CM | POA: Diagnosis not present

## 2022-09-16 DIAGNOSIS — I110 Hypertensive heart disease with heart failure: Secondary | ICD-10-CM | POA: Diagnosis not present

## 2022-09-16 DIAGNOSIS — G894 Chronic pain syndrome: Secondary | ICD-10-CM | POA: Diagnosis not present

## 2022-09-16 DIAGNOSIS — M545 Low back pain, unspecified: Secondary | ICD-10-CM | POA: Diagnosis not present

## 2022-09-16 DIAGNOSIS — J4489 Other specified chronic obstructive pulmonary disease: Secondary | ICD-10-CM | POA: Diagnosis not present

## 2022-09-16 DIAGNOSIS — J439 Emphysema, unspecified: Secondary | ICD-10-CM | POA: Diagnosis not present

## 2022-09-16 DIAGNOSIS — I5032 Chronic diastolic (congestive) heart failure: Secondary | ICD-10-CM | POA: Diagnosis not present

## 2022-09-16 DIAGNOSIS — E1140 Type 2 diabetes mellitus with diabetic neuropathy, unspecified: Secondary | ICD-10-CM | POA: Diagnosis not present

## 2022-09-17 DIAGNOSIS — E1140 Type 2 diabetes mellitus with diabetic neuropathy, unspecified: Secondary | ICD-10-CM | POA: Diagnosis not present

## 2022-09-17 DIAGNOSIS — M545 Low back pain, unspecified: Secondary | ICD-10-CM | POA: Diagnosis not present

## 2022-09-17 DIAGNOSIS — R531 Weakness: Secondary | ICD-10-CM | POA: Diagnosis not present

## 2022-09-17 DIAGNOSIS — I5032 Chronic diastolic (congestive) heart failure: Secondary | ICD-10-CM | POA: Diagnosis not present

## 2022-09-17 DIAGNOSIS — G894 Chronic pain syndrome: Secondary | ICD-10-CM | POA: Diagnosis not present

## 2022-09-17 DIAGNOSIS — S31113D Laceration without foreign body of abdominal wall, right lower quadrant without penetration into peritoneal cavity, subsequent encounter: Secondary | ICD-10-CM | POA: Diagnosis not present

## 2022-09-17 DIAGNOSIS — J4489 Other specified chronic obstructive pulmonary disease: Secondary | ICD-10-CM | POA: Diagnosis not present

## 2022-09-17 DIAGNOSIS — J439 Emphysema, unspecified: Secondary | ICD-10-CM | POA: Diagnosis not present

## 2022-09-17 DIAGNOSIS — I110 Hypertensive heart disease with heart failure: Secondary | ICD-10-CM | POA: Diagnosis not present

## 2022-09-20 DIAGNOSIS — I110 Hypertensive heart disease with heart failure: Secondary | ICD-10-CM | POA: Diagnosis not present

## 2022-09-20 DIAGNOSIS — M545 Low back pain, unspecified: Secondary | ICD-10-CM | POA: Diagnosis not present

## 2022-09-20 DIAGNOSIS — I5032 Chronic diastolic (congestive) heart failure: Secondary | ICD-10-CM | POA: Diagnosis not present

## 2022-09-20 DIAGNOSIS — G894 Chronic pain syndrome: Secondary | ICD-10-CM | POA: Diagnosis not present

## 2022-09-20 DIAGNOSIS — S31113D Laceration without foreign body of abdominal wall, right lower quadrant without penetration into peritoneal cavity, subsequent encounter: Secondary | ICD-10-CM | POA: Diagnosis not present

## 2022-09-20 DIAGNOSIS — R531 Weakness: Secondary | ICD-10-CM | POA: Diagnosis not present

## 2022-09-20 DIAGNOSIS — E1140 Type 2 diabetes mellitus with diabetic neuropathy, unspecified: Secondary | ICD-10-CM | POA: Diagnosis not present

## 2022-09-20 DIAGNOSIS — J439 Emphysema, unspecified: Secondary | ICD-10-CM | POA: Diagnosis not present

## 2022-09-20 DIAGNOSIS — J4489 Other specified chronic obstructive pulmonary disease: Secondary | ICD-10-CM | POA: Diagnosis not present

## 2022-09-22 DIAGNOSIS — I110 Hypertensive heart disease with heart failure: Secondary | ICD-10-CM | POA: Diagnosis not present

## 2022-09-22 DIAGNOSIS — S31113D Laceration without foreign body of abdominal wall, right lower quadrant without penetration into peritoneal cavity, subsequent encounter: Secondary | ICD-10-CM | POA: Diagnosis not present

## 2022-09-22 DIAGNOSIS — J439 Emphysema, unspecified: Secondary | ICD-10-CM | POA: Diagnosis not present

## 2022-09-22 DIAGNOSIS — J4489 Other specified chronic obstructive pulmonary disease: Secondary | ICD-10-CM | POA: Diagnosis not present

## 2022-09-22 DIAGNOSIS — E1140 Type 2 diabetes mellitus with diabetic neuropathy, unspecified: Secondary | ICD-10-CM | POA: Diagnosis not present

## 2022-09-22 DIAGNOSIS — G894 Chronic pain syndrome: Secondary | ICD-10-CM | POA: Diagnosis not present

## 2022-09-22 DIAGNOSIS — I5032 Chronic diastolic (congestive) heart failure: Secondary | ICD-10-CM | POA: Diagnosis not present

## 2022-09-22 DIAGNOSIS — M545 Low back pain, unspecified: Secondary | ICD-10-CM | POA: Diagnosis not present

## 2022-09-22 DIAGNOSIS — R531 Weakness: Secondary | ICD-10-CM | POA: Diagnosis not present

## 2022-09-24 DIAGNOSIS — G894 Chronic pain syndrome: Secondary | ICD-10-CM | POA: Diagnosis not present

## 2022-09-24 DIAGNOSIS — E1140 Type 2 diabetes mellitus with diabetic neuropathy, unspecified: Secondary | ICD-10-CM | POA: Diagnosis not present

## 2022-09-24 DIAGNOSIS — J4489 Other specified chronic obstructive pulmonary disease: Secondary | ICD-10-CM | POA: Diagnosis not present

## 2022-09-24 DIAGNOSIS — J439 Emphysema, unspecified: Secondary | ICD-10-CM | POA: Diagnosis not present

## 2022-09-24 DIAGNOSIS — I5032 Chronic diastolic (congestive) heart failure: Secondary | ICD-10-CM | POA: Diagnosis not present

## 2022-09-24 DIAGNOSIS — R531 Weakness: Secondary | ICD-10-CM | POA: Diagnosis not present

## 2022-09-24 DIAGNOSIS — M545 Low back pain, unspecified: Secondary | ICD-10-CM | POA: Diagnosis not present

## 2022-09-24 DIAGNOSIS — S31113D Laceration without foreign body of abdominal wall, right lower quadrant without penetration into peritoneal cavity, subsequent encounter: Secondary | ICD-10-CM | POA: Diagnosis not present

## 2022-09-24 DIAGNOSIS — I110 Hypertensive heart disease with heart failure: Secondary | ICD-10-CM | POA: Diagnosis not present

## 2022-10-01 DIAGNOSIS — S31113D Laceration without foreign body of abdominal wall, right lower quadrant without penetration into peritoneal cavity, subsequent encounter: Secondary | ICD-10-CM | POA: Diagnosis not present

## 2022-10-01 DIAGNOSIS — J439 Emphysema, unspecified: Secondary | ICD-10-CM | POA: Diagnosis not present

## 2022-10-01 DIAGNOSIS — I5032 Chronic diastolic (congestive) heart failure: Secondary | ICD-10-CM | POA: Diagnosis not present

## 2022-10-01 DIAGNOSIS — E1140 Type 2 diabetes mellitus with diabetic neuropathy, unspecified: Secondary | ICD-10-CM | POA: Diagnosis not present

## 2022-10-01 DIAGNOSIS — R531 Weakness: Secondary | ICD-10-CM | POA: Diagnosis not present

## 2022-10-01 DIAGNOSIS — J4489 Other specified chronic obstructive pulmonary disease: Secondary | ICD-10-CM | POA: Diagnosis not present

## 2022-10-01 DIAGNOSIS — G894 Chronic pain syndrome: Secondary | ICD-10-CM | POA: Diagnosis not present

## 2022-10-01 DIAGNOSIS — I110 Hypertensive heart disease with heart failure: Secondary | ICD-10-CM | POA: Diagnosis not present

## 2022-10-01 DIAGNOSIS — M545 Low back pain, unspecified: Secondary | ICD-10-CM | POA: Diagnosis not present

## 2022-10-03 DIAGNOSIS — M545 Low back pain, unspecified: Secondary | ICD-10-CM | POA: Diagnosis not present

## 2022-10-03 DIAGNOSIS — B372 Candidiasis of skin and nail: Secondary | ICD-10-CM | POA: Diagnosis not present

## 2022-10-03 DIAGNOSIS — R5381 Other malaise: Secondary | ICD-10-CM | POA: Diagnosis not present

## 2022-10-03 DIAGNOSIS — R7303 Prediabetes: Secondary | ICD-10-CM | POA: Diagnosis not present

## 2022-10-03 DIAGNOSIS — F321 Major depressive disorder, single episode, moderate: Secondary | ICD-10-CM | POA: Diagnosis not present

## 2022-10-03 DIAGNOSIS — R531 Weakness: Secondary | ICD-10-CM | POA: Diagnosis not present

## 2022-10-03 DIAGNOSIS — G894 Chronic pain syndrome: Secondary | ICD-10-CM | POA: Diagnosis not present

## 2022-10-03 DIAGNOSIS — E7800 Pure hypercholesterolemia, unspecified: Secondary | ICD-10-CM | POA: Diagnosis not present

## 2022-10-03 DIAGNOSIS — J439 Emphysema, unspecified: Secondary | ICD-10-CM | POA: Diagnosis not present

## 2022-10-03 DIAGNOSIS — Z8719 Personal history of other diseases of the digestive system: Secondary | ICD-10-CM | POA: Diagnosis not present

## 2022-10-03 DIAGNOSIS — I110 Hypertensive heart disease with heart failure: Secondary | ICD-10-CM | POA: Diagnosis not present

## 2022-10-03 DIAGNOSIS — G4730 Sleep apnea, unspecified: Secondary | ICD-10-CM | POA: Diagnosis not present

## 2022-10-03 DIAGNOSIS — J4489 Other specified chronic obstructive pulmonary disease: Secondary | ICD-10-CM | POA: Diagnosis not present

## 2022-10-03 DIAGNOSIS — E1140 Type 2 diabetes mellitus with diabetic neuropathy, unspecified: Secondary | ICD-10-CM | POA: Diagnosis not present

## 2022-10-03 DIAGNOSIS — I5032 Chronic diastolic (congestive) heart failure: Secondary | ICD-10-CM | POA: Diagnosis not present

## 2022-10-03 DIAGNOSIS — S31113D Laceration without foreign body of abdominal wall, right lower quadrant without penetration into peritoneal cavity, subsequent encounter: Secondary | ICD-10-CM | POA: Diagnosis not present

## 2022-10-09 DIAGNOSIS — M545 Low back pain, unspecified: Secondary | ICD-10-CM | POA: Diagnosis not present

## 2022-10-09 DIAGNOSIS — I110 Hypertensive heart disease with heart failure: Secondary | ICD-10-CM | POA: Diagnosis not present

## 2022-10-09 DIAGNOSIS — G894 Chronic pain syndrome: Secondary | ICD-10-CM | POA: Diagnosis not present

## 2022-10-09 DIAGNOSIS — J4489 Other specified chronic obstructive pulmonary disease: Secondary | ICD-10-CM | POA: Diagnosis not present

## 2022-10-09 DIAGNOSIS — R531 Weakness: Secondary | ICD-10-CM | POA: Diagnosis not present

## 2022-10-09 DIAGNOSIS — S31113D Laceration without foreign body of abdominal wall, right lower quadrant without penetration into peritoneal cavity, subsequent encounter: Secondary | ICD-10-CM | POA: Diagnosis not present

## 2022-10-09 DIAGNOSIS — I5032 Chronic diastolic (congestive) heart failure: Secondary | ICD-10-CM | POA: Diagnosis not present

## 2022-10-09 DIAGNOSIS — E1140 Type 2 diabetes mellitus with diabetic neuropathy, unspecified: Secondary | ICD-10-CM | POA: Diagnosis not present

## 2022-10-09 DIAGNOSIS — J439 Emphysema, unspecified: Secondary | ICD-10-CM | POA: Diagnosis not present

## 2022-10-10 ENCOUNTER — Ambulatory Visit: Payer: Medicare HMO | Admitting: Podiatry

## 2022-10-13 ENCOUNTER — Ambulatory Visit (INDEPENDENT_AMBULATORY_CARE_PROVIDER_SITE_OTHER): Payer: No Typology Code available for payment source | Admitting: Primary Care

## 2022-10-13 ENCOUNTER — Encounter: Payer: Self-pay | Admitting: Primary Care

## 2022-10-13 VITALS — BP 128/74 | HR 79 | Temp 98.1°F | Ht 71.0 in | Wt 335.0 lb

## 2022-10-13 DIAGNOSIS — Z01811 Encounter for preprocedural respiratory examination: Secondary | ICD-10-CM

## 2022-10-13 DIAGNOSIS — I5032 Chronic diastolic (congestive) heart failure: Secondary | ICD-10-CM | POA: Diagnosis not present

## 2022-10-13 DIAGNOSIS — J9611 Chronic respiratory failure with hypoxia: Secondary | ICD-10-CM | POA: Diagnosis not present

## 2022-10-13 DIAGNOSIS — J984 Other disorders of lung: Secondary | ICD-10-CM | POA: Diagnosis not present

## 2022-10-13 NOTE — Assessment & Plan Note (Signed)
-   Respiratory exam benign. VSS. Patient will be considered moderate risk for prolonged mechanical ventilation and/or post op pulmonary complications due to obesity, restrictive lung disease, mild OSA and chronic respiratory failure. He is optimized for surgery from pulmonary standpoint.  Ultimate clearance will be decided by surgeon and anesthesiology.  Major Pulmonary risks identified in the multifactorial risk analysis are but not limited to a) pneumonia; b) recurrent intubation risk; c) prolonged or recurrent acute respiratory failure needing mechanical ventilation; d) prolonged hospitalization; e) DVT/Pulmonary embolism; f) Acute Pulmonary edema  Recommend 1. Short duration of surgery as much as possible and avoid paralytic if possible 2. Recovery in step down or ICU with Pulmonary consultation if needed 3. DVT prophylaxis 4. Aggressive pulmonary toilet with o2, bronchodilatation, and incentive spirometry and early ambulation

## 2022-10-13 NOTE — Progress Notes (Signed)
@Patient  ID: [name], male    DOB: 05-09-1965, 57 y.o.   MRN: [id number]  Chief Complaint  Patient presents with   Follow-up    SOB with transfer and prod cough with white sputum    Referring provider: [name], MD  HPI: 57 year old male, never smoked. PMH significant for CHF, OSA, chronic respiratory failure, restrictive lung disease, type 2 diabetes, polycythemia anemia, osteoarthritis, chronic pain syndrome, long-term opioid use, morbid obesity. Patient of Dr. Wynona Neat.   10/13/2022 Patient presents today for surgical risk assessment/morphine pump exchange. History of OSA and chronic respiratory failure. PSG 12/05/19 showed mild OSA with severe oxygen desaturations. CPAP titration on 01/01/20>> OSA adequately treated with CPAP 8cm h20 with medium size resmed nasal pillow mask airfit P10. Declined restarting CPAP. He has lost 50-70 lbs since 2021. He is sleeping better. Wife reports that patient is no longer snoring. He has a upper airway cough/congestion with clear mucus production. He gets winded with transfers. He wears 2L oxygen at night. He is wheelchair bound, sleeps in hospital bed.  No acute shortness of breath, chest tightness or wheezing.   Allergies  Allergen Reactions   Tape Other (See Comments)    Blisters - tolerates paper tape well    Immunization History  Administered Date(s) Administered   Influenza,inj,Quad PF,6+ Mos 06/03/2018, 04/02/2022   Influenza-Unspecified 06/20/2019   PFIZER(Purple Top)SARS-COV-2 Vaccination 07/08/2019, 07/29/2019   PNEUMOCOCCAL CONJUGATE-20 04/02/2022   Tdap 08/25/2019    Past Medical History:  Diagnosis Date   Arthritis    Asthma    as child   Back pain    CHF (congestive heart failure) (HCC)    COPD (chronic obstructive pulmonary disease) (HCC)    GERD (gastroesophageal reflux disease)    Headache    Hernia    History of hiatal hernia    History of kidney stones    Hypogonadism in male    Neuromuscular disorder  (HCC)    back injury with surgery   On home oxygen therapy    PONV (postoperative nausea and vomiting)    gets nausea   Pre-diabetes    Shortness of breath dyspnea    Sleep apnea    Does not use CPAP, new equipment 12/2014   Ulcer    Wears glasses     Tobacco History: Social History   Tobacco Use  Smoking Status Never  Smokeless Tobacco Current   Types: Snuff, Chew   Ready to quit: Not Answered Counseling given: Not Answered   Outpatient Medications Prior to Visit  Medication Sig Dispense Refill   pregabalin (LYRICA) 150 MG capsule Take 150 mg by mouth in the morning, at noon, and at bedtime.     acetaminophen (TYLENOL) 500 MG tablet Take 1,000 mg by mouth every 6 (six) hours as needed for mild pain or moderate pain.      aspirin EC 81 MG tablet Take 81 mg by mouth daily.     clotrimazole-betamethasone (LOTRISONE) cream Apply to affected area 2 times day for one week (Patient taking differently: Apply 1 Application topically 2 (two) times daily as needed (rash).) 45 g 0   DULoxetine (CYMBALTA) 30 MG capsule Take 90 mg by mouth daily.     esomeprazole (NEXIUM) 40 MG capsule Take 1 capsule (40 mg total) by mouth daily. (Patient taking differently: Take 40 mg by mouth daily as needed (heart burn).) 90 capsule 3   furosemide (LASIX) 40 MG tablet Take 40 mg by mouth daily as needed  for fluid or edema. (Patient not taking: Reported on 08/23/2022)     HYDROmorphone (DILAUDID) 4 MG tablet Take 1 tablet (4 mg) by mouth 4 times a day as needed (Patient taking differently: Take 4 mg by mouth every 6 (six) hours as needed for moderate pain or severe pain.) 12 tablet 0   morphine (MS CONTIN) 30 MG 12 hr tablet Take 1 tablet (30 mg total) by mouth every 12 (twelve) hours. Must last 30 days. Do not break tablet 60 tablet 0   naloxone (NARCAN) nasal spray 4 mg/0.1 mL Place 1 spray into the nose as needed for up to 365 doses (for opioid-induced respiratory depresssion). In case of emergency  (overdose), spray once into each nostril. If no response within 3 minutes, repeat application and call 911. 1 each 0   nystatin (MYCOSTATIN/NYSTOP) powder Apply topically 2 (two) times daily. (Patient not taking: Reported on 08/23/2022) 15 g 0   promethazine (PHENERGAN) 25 MG tablet Take 25-50 mg by mouth every 6 (six) hours as needed for nausea or vomiting.      Skin Protectants, Misc. (INTERDRY 04"V409") SHEE Apply 1 application  topically 2 (two) times daily. (Patient not taking: Reported on 08/23/2022) 60 each 0   tiZANidine (ZANAFLEX) 4 MG tablet Take 1 tablet (4 mg total) by mouth every 8 (eight) hours as needed for muscle spasms. 90 tablet 5   No facility-administered medications prior to visit.   Review of Systems  Review of Systems  Constitutional: Negative.   Respiratory: Negative.     Physical Exam  BP 128/74 (BP Location: Left Arm, Cuff Size: Large)   Pulse 79   Temp 98.1 F (36.7 C) (Temporal)   Ht 5\' 11"  (1.803 m)   Wt (!) 335 lb (152 kg) Comment: weight is per pt  SpO2 95%   BMI 46.72 kg/m  Physical Exam Constitutional:      General: He is not in acute distress.    Appearance: Normal appearance. He is not ill-appearing.  HENT:     Head: Normocephalic and atraumatic.     Mouth/Throat:     Mouth: Mucous membranes are moist.     Pharynx: Oropharynx is clear.  Cardiovascular:     Rate and Rhythm: Normal rate and regular rhythm.     Comments: No LE edema  Pulmonary:     Effort: Pulmonary effort is normal.     Breath sounds: Normal breath sounds. No wheezing, rhonchi or rales.     Comments: CTA; RA Musculoskeletal:     Comments: Wheelchair boung  Skin:    General: Skin is warm and dry.  Neurological:     General: No focal deficit present.     Mental Status: He is alert and oriented to person, place, and time. Mental status is at baseline.  Psychiatric:        Mood and Affect: Mood normal.        Behavior: Behavior normal.        Thought Content: Thought  content normal.        Judgment: Judgment normal.      Lab Results:  CBC    Component Value Date/Time   WBC 7.2 08/24/2022 0600   RBC 4.20 (L) 08/24/2022 0600   HGB 12.1 (L) 08/24/2022 0600   HCT 39.4 08/24/2022 0600   PLT 231 08/24/2022 0600   MCV 93.8 08/24/2022 0600   MCH 28.8 08/24/2022 0600   MCHC 30.7 08/24/2022 0600   RDW 15.5 08/24/2022 0600  LYMPHSABS 2.3 08/22/2022 2341   MONOABS 0.6 08/22/2022 2341   EOSABS 0.3 08/22/2022 2341   BASOSABS 0.1 08/22/2022 2341    BMET    Component Value Date/Time   NA 139 08/24/2022 0600   NA 140 11/17/2017 1255   K 3.8 08/24/2022 0600   CL 100 08/24/2022 0600   CO2 27 08/24/2022 0600   GLUCOSE 138 (H) 08/24/2022 0600   BUN 9 08/24/2022 0600   BUN 10 11/17/2017 1255   CREATININE 0.62 08/24/2022 0600   CALCIUM 8.7 (L) 08/24/2022 0600   GFRNONAA >60 08/24/2022 0600   GFRAA >60 12/29/2019 1031    BNP    Component Value Date/Time   BNP 72.6 09/14/2019 1157    ProBNP    Component Value Date/Time   PROBNP 23.0 10/20/2014 1247    Imaging: No results found.   Assessment & Plan:   Sleep apnea - PSG 12/05/19 showed mild OSA with severe oxygen desaturations. CPAP titration on 01/01/20>> OSA adequately treated with CPAP 8cm h20. Patient intolerant to CPAP.  Recently lost more than 70 pounds.  He is sleeping better and no longer snoring.  Encourage patient elevate head of bed 30 degrees.  Chronic respiratory failure (HCC) - Due to obesity, restrictive lung disease and heart failure - CXR in May 2024>> no acute cardiopulmonary disease, enlarged cardiac silhouette - Continue 2 L of oxygen at bedtime, will check an overnight oximetry test on oxygen to evaluate nocturnal hypoxia   Restrictive airway disease - No known underlying lung disease - Encourage weight loss   Encounter for pre-operative respiratory clearance - Respiratory exam benign. VSS. Patient will be considered moderate risk for prolonged mechanical  ventilation and/or post op pulmonary complications due to obesity, restrictive lung disease, mild OSA and chronic respiratory failure. He is optimized for surgery from pulmonary standpoint.  Ultimate clearance will be decided by surgeon and anesthesiology.  Major Pulmonary risks identified in the multifactorial risk analysis are but not limited to a) pneumonia; b) recurrent intubation risk; c) prolonged or recurrent acute respiratory failure needing mechanical ventilation; d) prolonged hospitalization; e) DVT/Pulmonary embolism; f) Acute Pulmonary edema  Recommend 1. Short duration of surgery as much as possible and avoid paralytic if possible 2. Recovery in step down or ICU with Pulmonary consultation if needed 3. DVT prophylaxis 4. Aggressive pulmonary toilet with o2, bronchodilatation, and incentive spirometry and early ambulation      1) RISK FOR PROLONGED MECHANICAL VENTILAION - > 48h  1A) Arozullah - Prolonged mech ventilation risk Arozullah Postperative Pulmonary Risk Score - for mech ventilation dependence >48h USAA, Ann Surg 2000, major non-cardiac surgery) Comment Score  Type of surgery - abd ao aneurysm (27), thoracic (21), neurosurgery / upper abdominal / vascular (21), neck (11) Morphine pump exchange 21  Emergency Surgery - (11)  0  ALbumin < 3 or poor nutritional state - (9)  0  BUN > 30 -  (8)  0  Partial or completely dependent functional status - (7)  7  COPD -  (6)  0  Age - 60 to 69 (4), > 70  (6)  4  TOTAL  32  Risk Stratifcation scores  - < 10 (0.5%), 11-19 (1.8%), 20-27 (4.2%), 28-40 (10.1%), >40 (26.6%)  10.1% risk       1B) GUPTA - Prolonged Mech Vent Risk Score source Risk  Guptal post op prolonged mech ventilation > 48h or reintubation < 30 days - ACS 2007-2008 dataset - [email or web address] 1.5 % Risk of mechanical  ventilation for >48 hrs after surgery, or unplanned intubation ?30 days of surgery     2) RISK FOR POST OP PNEUMONIA Score source Risk  Chales Abrahams - Post Op Pnemounia risk  [email or web address] 0.7 % Risk of postoperative pneumonia    R3) ISK FOR ANY POST-OP PULMONARY COMPLICATION Score source Risk  CANET/ARISCAT Score - risk for ANY/ALl pulmonary complications - > risk of in-hospital post-op pulmonary complications (composite including respiratory failure, respiratory infection, pleural effusion, atelectasis, pneumothorax, bronchospasm, aspiration pneumonitis) [employer] - based on age, anemia, pulse ox, resp infection prior 30d, incision site, duration of surgery, and emergency v elective surgery Intermediate risk 13.3% risk of in-hospital post-op pulmonary complications (composite including respiratory failure, respiratory infection, pleural effusion, atelectasis, pneumothorax, bronchospasm, aspiration pneumonitis)     Glenford Bayley, NP 10/13/2022

## 2022-10-13 NOTE — Assessment & Plan Note (Addendum)
-   Due to obesity, restrictive lung disease, opioid use and heart failure - CXR in May 2024>> no acute cardiopulmonary disease, enlarged cardiac silhouette - Continue 2 L of oxygen at bedtime, will check an overnight oximetry test on oxygen to evaluate nocturnal hypoxia

## 2022-10-13 NOTE — Patient Instructions (Addendum)
Optimized for surgery from pulmonary standpoint  Moderate risk for prolonged mechanical ventilation and/or post op pulmonary complications due to chronic respiratory failure and nature of the surgery.   Encourage early mobilization/sit up in chair, use incentive spirometer 10x/hour and wear compression stockings post op   Continue to wear 2L oxygen at bedtime and with activity to maintain O2 >88-90% Elevate head of bed 30 degrees while sleeping at night  Notify office if you develop colored sputum, fevers or increased shortness of breath   Orders: ONO on 2L   Follow-up:  6 months with Dr. Wynona Neat   Chronic Respiratory Failure Chronic respiratory failure is a long-term type of respiratory failure. Respiratory failure is when one or both of these things happen: Oxygen cannot pass from the lungs into the blood, causing the blood oxygen level to drop. Loss of blood oxygen means tissues and organs may not work well. A gas called carbon dioxide cannot pass from the blood into the lungs so the body can get rid of it. The buildup of carbon dioxide can damage the tissues and organs in the body. Chronic respiratory failure may develop over weeks, months, or years. It is usually the body's response to another long-term (chronic) condition that affects breathing. You may also get sudden shortness of breath (acute respiratory failure) when you have chronic respiratory failure. Sudden shortness of breath needs to be treated right away. What increases the risk? You are more likely to develop this condition if you have another chronic condition, such as: Chronic obstructive pulmonary disease (COPD). Cystic fibrosis. Neuromuscular weakness from a stroke, spinal cord injury, or myasthenia gravis. Pulmonary fibrosis. This is scarring of the lung tissue. Sleep apnea. Obesity-hypoventilation syndrome. Congestive heart failure. What are the signs or symptoms? Chronic respiratory failure may not have clear  symptoms when it happens slowly over time and the body gets used to it. Symptoms of this condition may include: Shortness of breath or trouble breathing. Feeling very tired, sleepy, or low energy. Blurred vision or headaches. Fast breathing. Confusion. Fingernail beds or toenail beds that look bluish. Fingernails or toenails that look wide, swollen, spongy, or bulging. How is this diagnosed? This condition may be diagnosed based on: Your medical history. A physical exam. Other tests. These may include: Blood tests, such as an arterial blood gas test to check oxygen and carbon dioxide levels. Imaging tests, such as a chest X-ray or CT scan. Pulmonary function tests. These help to find out if you have chronic lung disease. An echocardiogram. This test uses sound waves to make pictures of your heart. How is this treated? Treatment for this condition depends on the cause. Treatment may include: Oxygen given through a tube with prongs that sit in your nose (nasal cannula) or through a mask that fits over your face. Noninvasive positive pressure ventilation. This is breathing support using a machine to blow air into your lungs through a mask. Examples of these machines are: Continuous positive airway pressure (CPAP) machine. Bilevel positive airway pressure (BIPAP) machine. Medicines to help with breathing, such as: Medicines that open up and relax air passages, such as bronchodilators. These may be given through a device that turns liquid medicines into a mist you can breathe in (nebulizer). Diuretics. These medicines get rid of extra fluid in your lungs. Steroid medicines. These decrease inflammation in the lungs. Pulmonary rehabilitation. This is an exercise program that strengthens the muscles in your chest and helps you learn breathing methods to manage your condition. A ventilator. This  is a breathing machine that sends oxygen to your lungs through a tube that is put into your windpipe  (trachea). This machine is used when you can no longer breathe well enough on your own. Follow these instructions at home: Medicines Take over-the-counter and prescription medicines only as told by your health care provider. If you were prescribed antibiotics, take them as told by your health care provider. Do not stop using the antibiotic even if you start to feel better. General instructions  Do not use any products that contain nicotine or tobacco. These products include cigarettes, chewing tobacco, and vaping devices, such as e-cigarettes. If you need help quitting, ask your health care provider. If you were told to use oxygen therapy at home, follow the instructions from your health care provider. Do not use more oxygen than you were told. Getting too much oxygen may be harmful when you have this condition. If you were given a CPAP or BIPAP machine, follow the instructions for use, cleaning, and care as told by your health care provider. Return to your normal activities as told by your health care provider. Ask your health care provider what activities are safe for you. Stay up to date on all vaccines, especially pneumonia and yearly flu (influenza) vaccines. Stay away from people who are sick, and avoid crowded places, especially during the cold and flu season. Keep all follow-up visits. Contact a health care provider if: Your shortness of breath gets worse. You have more mucus from your lungs (sputum), high-pitched whistling sounds when you breathe (wheezing), coughing, or loss of energy. You get oxygen therapy and you still have trouble breathing. You have a fever of 100.40F (38C) or higher. Get help right away if: Your shortness of breath gets much worse or gets worse very fast. You get pain, tightness, or pressure in your chest. You cannot say more than a few words without having to catch your breath. Your lips, toenails, or fingernails look bluish. You become confused or difficult  to wake up. These symptoms may be an emergency. Get help right away. Call 911. Do not wait to see if the symptoms will go away. Do not drive yourself to the hospital. Summary Chronic respiratory failure is a long-term breathing problem that may develop over weeks, months, or years. It is usually the body's response to another long-term condition that affects breathing. This condition may not have clear symptoms when it happens slowly over time and the body gets used to it. Shortness of breath and trouble breathing are the most common symptoms of this condition. If you were told to use oxygen therapy at home, do not use more oxygen than you were told. Getting too much oxygen may be harmful when you have this condition. This information is not intended to replace advice given to you by your health care provider. Make sure you discuss any questions you have with your health care provider. Document Revised: 06/07/2021 Document Reviewed: 06/07/2021 Elsevier Patient Education  2024 [employer].

## 2022-10-13 NOTE — Assessment & Plan Note (Addendum)
-   PSG 12/05/19 showed mild OSA with severe oxygen desaturations. CPAP titration on 01/01/20>> OSA adequately treated with CPAP 8cm h20. Patient intolerant to CPAP.  Recently lost more than 70 pounds.  He is sleeping better and no longer snoring.  Encourage patient elevate head of bed 30 degrees.

## 2022-10-13 NOTE — Assessment & Plan Note (Addendum)
-   No known underlying lung disease - Encourage weight loss

## 2022-10-23 DIAGNOSIS — J9611 Chronic respiratory failure with hypoxia: Secondary | ICD-10-CM | POA: Diagnosis not present

## 2022-10-23 DIAGNOSIS — I5032 Chronic diastolic (congestive) heart failure: Secondary | ICD-10-CM | POA: Diagnosis not present

## 2022-10-31 ENCOUNTER — Ambulatory Visit: Payer: Medicare HMO | Admitting: Podiatry

## 2022-11-05 DIAGNOSIS — T85695A Other mechanical complication of other nervous system device, implant or graft, initial encounter: Secondary | ICD-10-CM | POA: Diagnosis not present

## 2022-11-05 DIAGNOSIS — G894 Chronic pain syndrome: Secondary | ICD-10-CM | POA: Diagnosis not present

## 2023-04-17 ENCOUNTER — Ambulatory Visit: Payer: Medicare HMO | Admitting: Primary Care

## 2023-04-20 ENCOUNTER — Telehealth: Payer: Self-pay | Admitting: Primary Care

## 2023-04-20 NOTE — Telephone Encounter (Signed)
 ONO 10/23/2022 on 2L showed SpO2 low 68% with baseline 96%.  Patient spent 1 minute 57 seconds with oxygen level less than 88%.  No changes recommended.

## 2023-04-28 ENCOUNTER — Encounter: Payer: Self-pay | Admitting: Primary Care

## 2023-06-09 DIAGNOSIS — S99921A Unspecified injury of right foot, initial encounter: Secondary | ICD-10-CM | POA: Diagnosis not present

## 2023-06-09 DIAGNOSIS — R197 Diarrhea, unspecified: Secondary | ICD-10-CM | POA: Diagnosis not present

## 2023-06-09 DIAGNOSIS — R600 Localized edema: Secondary | ICD-10-CM | POA: Diagnosis not present

## 2023-06-09 DIAGNOSIS — B372 Candidiasis of skin and nail: Secondary | ICD-10-CM | POA: Diagnosis not present

## 2023-06-09 DIAGNOSIS — J9611 Chronic respiratory failure with hypoxia: Secondary | ICD-10-CM | POA: Diagnosis not present

## 2023-06-09 DIAGNOSIS — M51369 Other intervertebral disc degeneration, lumbar region without mention of lumbar back pain or lower extremity pain: Secondary | ICD-10-CM | POA: Diagnosis not present

## 2023-06-09 DIAGNOSIS — I272 Pulmonary hypertension, unspecified: Secondary | ICD-10-CM | POA: Diagnosis not present

## 2023-06-09 DIAGNOSIS — I509 Heart failure, unspecified: Secondary | ICD-10-CM | POA: Diagnosis not present

## 2023-08-06 DIAGNOSIS — R609 Edema, unspecified: Secondary | ICD-10-CM | POA: Diagnosis not present

## 2023-08-06 DIAGNOSIS — Z1330 Encounter for screening examination for mental health and behavioral disorders, unspecified: Secondary | ICD-10-CM | POA: Diagnosis not present

## 2023-08-06 DIAGNOSIS — M549 Dorsalgia, unspecified: Secondary | ICD-10-CM | POA: Diagnosis not present

## 2023-08-06 DIAGNOSIS — I272 Pulmonary hypertension, unspecified: Secondary | ICD-10-CM | POA: Diagnosis not present

## 2023-08-13 ENCOUNTER — Ambulatory Visit: Admitting: Podiatry

## 2023-10-29 ENCOUNTER — Encounter (HOSPITAL_COMMUNITY): Payer: Self-pay

## 2023-10-29 ENCOUNTER — Inpatient Hospital Stay (HOSPITAL_COMMUNITY)
Admission: EM | Admit: 2023-10-29 | Discharge: 2023-11-09 | DRG: 871 | Disposition: A | Discharge status: Home Health | Attending: Internal Medicine | Admitting: Internal Medicine

## 2023-10-29 ENCOUNTER — Other Ambulatory Visit: Payer: Self-pay

## 2023-10-29 DIAGNOSIS — J440 Chronic obstructive pulmonary disease with acute lower respiratory infection: Secondary | ICD-10-CM | POA: Diagnosis present

## 2023-10-29 DIAGNOSIS — F17290 Nicotine dependence, other tobacco product, uncomplicated: Secondary | ICD-10-CM | POA: Diagnosis present

## 2023-10-29 DIAGNOSIS — Z833 Family history of diabetes mellitus: Secondary | ICD-10-CM | POA: Diagnosis not present

## 2023-10-29 DIAGNOSIS — Z993 Dependence on wheelchair: Secondary | ICD-10-CM

## 2023-10-29 DIAGNOSIS — J9621 Acute and chronic respiratory failure with hypoxia: Secondary | ICD-10-CM | POA: Diagnosis present

## 2023-10-29 DIAGNOSIS — R197 Diarrhea, unspecified: Secondary | ICD-10-CM | POA: Diagnosis not present

## 2023-10-29 DIAGNOSIS — J9622 Acute and chronic respiratory failure with hypercapnia: Secondary | ICD-10-CM | POA: Diagnosis not present

## 2023-10-29 DIAGNOSIS — I509 Heart failure, unspecified: Secondary | ICD-10-CM | POA: Diagnosis not present

## 2023-10-29 DIAGNOSIS — W19XXXA Unspecified fall, initial encounter: Secondary | ICD-10-CM | POA: Diagnosis not present

## 2023-10-29 DIAGNOSIS — G894 Chronic pain syndrome: Secondary | ICD-10-CM | POA: Diagnosis present

## 2023-10-29 DIAGNOSIS — R4182 Altered mental status, unspecified: Secondary | ICD-10-CM | POA: Diagnosis not present

## 2023-10-29 DIAGNOSIS — E878 Other disorders of electrolyte and fluid balance, not elsewhere classified: Secondary | ICD-10-CM | POA: Diagnosis present

## 2023-10-29 DIAGNOSIS — R6521 Severe sepsis with septic shock: Secondary | ICD-10-CM | POA: Diagnosis not present

## 2023-10-29 DIAGNOSIS — Z5320 Procedure and treatment not carried out because of patient's decision for unspecified reasons: Secondary | ICD-10-CM | POA: Diagnosis present

## 2023-10-29 DIAGNOSIS — J9612 Chronic respiratory failure with hypercapnia: Secondary | ICD-10-CM | POA: Diagnosis not present

## 2023-10-29 DIAGNOSIS — Z7982 Long term (current) use of aspirin: Secondary | ICD-10-CM | POA: Diagnosis not present

## 2023-10-29 DIAGNOSIS — J9601 Acute respiratory failure with hypoxia: Secondary | ICD-10-CM | POA: Diagnosis not present

## 2023-10-29 DIAGNOSIS — Z9981 Dependence on supplemental oxygen: Secondary | ICD-10-CM | POA: Diagnosis not present

## 2023-10-29 DIAGNOSIS — E874 Mixed disorder of acid-base balance: Secondary | ICD-10-CM | POA: Diagnosis present

## 2023-10-29 DIAGNOSIS — I952 Hypotension due to drugs: Secondary | ICD-10-CM | POA: Diagnosis not present

## 2023-10-29 DIAGNOSIS — R918 Other nonspecific abnormal finding of lung field: Secondary | ICD-10-CM | POA: Diagnosis not present

## 2023-10-29 DIAGNOSIS — Z7401 Bed confinement status: Secondary | ICD-10-CM

## 2023-10-29 DIAGNOSIS — B9620 Unspecified Escherichia coli [E. coli] as the cause of diseases classified elsewhere: Secondary | ICD-10-CM | POA: Diagnosis not present

## 2023-10-29 DIAGNOSIS — I10 Essential (primary) hypertension: Secondary | ICD-10-CM | POA: Diagnosis not present

## 2023-10-29 DIAGNOSIS — Z79899 Other long term (current) drug therapy: Secondary | ICD-10-CM

## 2023-10-29 DIAGNOSIS — T426X5A Adverse effect of other antiepileptic and sedative-hypnotic drugs, initial encounter: Secondary | ICD-10-CM | POA: Diagnosis not present

## 2023-10-29 DIAGNOSIS — N390 Urinary tract infection, site not specified: Secondary | ICD-10-CM | POA: Diagnosis present

## 2023-10-29 DIAGNOSIS — R739 Hyperglycemia, unspecified: Secondary | ICD-10-CM | POA: Diagnosis not present

## 2023-10-29 DIAGNOSIS — A419 Sepsis, unspecified organism: Secondary | ICD-10-CM | POA: Diagnosis not present

## 2023-10-29 DIAGNOSIS — R6520 Severe sepsis without septic shock: Secondary | ICD-10-CM | POA: Diagnosis not present

## 2023-10-29 DIAGNOSIS — Z91048 Other nonmedicinal substance allergy status: Secondary | ICD-10-CM

## 2023-10-29 DIAGNOSIS — G4733 Obstructive sleep apnea (adult) (pediatric): Secondary | ICD-10-CM | POA: Diagnosis not present

## 2023-10-29 DIAGNOSIS — Z96652 Presence of left artificial knee joint: Secondary | ICD-10-CM | POA: Diagnosis present

## 2023-10-29 DIAGNOSIS — I272 Pulmonary hypertension, unspecified: Secondary | ICD-10-CM | POA: Diagnosis not present

## 2023-10-29 DIAGNOSIS — Z79891 Long term (current) use of opiate analgesic: Secondary | ICD-10-CM

## 2023-10-29 DIAGNOSIS — I5031 Acute diastolic (congestive) heart failure: Secondary | ICD-10-CM | POA: Diagnosis not present

## 2023-10-29 DIAGNOSIS — E7220 Disorder of urea cycle metabolism, unspecified: Secondary | ICD-10-CM | POA: Diagnosis present

## 2023-10-29 DIAGNOSIS — A4151 Sepsis due to Escherichia coli [E. coli]: Secondary | ICD-10-CM | POA: Diagnosis present

## 2023-10-29 DIAGNOSIS — R001 Bradycardia, unspecified: Secondary | ICD-10-CM | POA: Diagnosis not present

## 2023-10-29 DIAGNOSIS — K219 Gastro-esophageal reflux disease without esophagitis: Secondary | ICD-10-CM | POA: Diagnosis present

## 2023-10-29 DIAGNOSIS — E662 Morbid (severe) obesity with alveolar hypoventilation: Secondary | ICD-10-CM | POA: Diagnosis present

## 2023-10-29 DIAGNOSIS — J984 Other disorders of lung: Secondary | ICD-10-CM | POA: Diagnosis present

## 2023-10-29 DIAGNOSIS — G9341 Metabolic encephalopathy: Secondary | ICD-10-CM | POA: Diagnosis present

## 2023-10-29 DIAGNOSIS — I2723 Pulmonary hypertension due to lung diseases and hypoxia: Secondary | ICD-10-CM | POA: Diagnosis present

## 2023-10-29 DIAGNOSIS — I5033 Acute on chronic diastolic (congestive) heart failure: Secondary | ICD-10-CM | POA: Diagnosis present

## 2023-10-29 DIAGNOSIS — Z6841 Body Mass Index (BMI) 40.0 and over, adult: Secondary | ICD-10-CM | POA: Diagnosis not present

## 2023-10-29 DIAGNOSIS — J189 Pneumonia, unspecified organism: Secondary | ICD-10-CM | POA: Diagnosis present

## 2023-10-29 DIAGNOSIS — E876 Hypokalemia: Secondary | ICD-10-CM | POA: Diagnosis present

## 2023-10-29 DIAGNOSIS — Y92008 Other place in unspecified non-institutional (private) residence as the place of occurrence of the external cause: Secondary | ICD-10-CM | POA: Diagnosis not present

## 2023-10-29 DIAGNOSIS — R0689 Other abnormalities of breathing: Principal | ICD-10-CM

## 2023-10-29 DIAGNOSIS — F17220 Nicotine dependence, chewing tobacco, uncomplicated: Secondary | ICD-10-CM | POA: Diagnosis present

## 2023-10-29 DIAGNOSIS — I959 Hypotension, unspecified: Secondary | ICD-10-CM | POA: Diagnosis not present

## 2023-10-29 DIAGNOSIS — R0989 Other specified symptoms and signs involving the circulatory and respiratory systems: Secondary | ICD-10-CM | POA: Diagnosis not present

## 2023-10-29 DIAGNOSIS — Z4682 Encounter for fitting and adjustment of non-vascular catheter: Secondary | ICD-10-CM | POA: Diagnosis not present

## 2023-10-29 DIAGNOSIS — J9690 Respiratory failure, unspecified, unspecified whether with hypoxia or hypercapnia: Secondary | ICD-10-CM | POA: Diagnosis not present

## 2023-10-29 DIAGNOSIS — R000 Tachycardia, unspecified: Secondary | ICD-10-CM | POA: Diagnosis not present

## 2023-10-29 DIAGNOSIS — J811 Chronic pulmonary edema: Secondary | ICD-10-CM | POA: Diagnosis not present

## 2023-10-29 DIAGNOSIS — R0902 Hypoxemia: Secondary | ICD-10-CM | POA: Diagnosis not present

## 2023-10-29 DIAGNOSIS — I517 Cardiomegaly: Secondary | ICD-10-CM | POA: Diagnosis not present

## 2023-10-29 LAB — CBG MONITORING, ED: Glucose-Capillary: 137 mg/dL — ABNORMAL HIGH (ref 70–99)

## 2023-10-29 NOTE — ED Triage Notes (Signed)
 PT BIB [facility]. PT has Hx of UTI  PT's wife told EMS this is how he usually acts when he becomes septic. PT was found by EMS laying in his own urine and feces from several days ago. PT had home aid that came yesterday but PT wouldn't let her clean him up. PT was found on the floor by FD and was back in bed by the time EMS arrived. PT apparently fell out of his powerchair.  EMS vitals:  BP: 176/120 HR: 105 SpO2: 95% on 12 lpm via NRB RR: 35 CBG: 179

## 2023-10-30 ENCOUNTER — Inpatient Hospital Stay (HOSPITAL_COMMUNITY)

## 2023-10-30 ENCOUNTER — Emergency Department (HOSPITAL_COMMUNITY)

## 2023-10-30 DIAGNOSIS — J189 Pneumonia, unspecified organism: Secondary | ICD-10-CM | POA: Diagnosis present

## 2023-10-30 DIAGNOSIS — A419 Sepsis, unspecified organism: Secondary | ICD-10-CM | POA: Diagnosis not present

## 2023-10-30 DIAGNOSIS — R4182 Altered mental status, unspecified: Secondary | ICD-10-CM | POA: Diagnosis not present

## 2023-10-30 DIAGNOSIS — Z9981 Dependence on supplemental oxygen: Secondary | ICD-10-CM | POA: Diagnosis not present

## 2023-10-30 DIAGNOSIS — I5031 Acute diastolic (congestive) heart failure: Secondary | ICD-10-CM | POA: Diagnosis not present

## 2023-10-30 DIAGNOSIS — N390 Urinary tract infection, site not specified: Secondary | ICD-10-CM

## 2023-10-30 DIAGNOSIS — E876 Hypokalemia: Secondary | ICD-10-CM | POA: Diagnosis present

## 2023-10-30 DIAGNOSIS — I5033 Acute on chronic diastolic (congestive) heart failure: Secondary | ICD-10-CM

## 2023-10-30 DIAGNOSIS — B9620 Unspecified Escherichia coli [E. coli] as the cause of diseases classified elsewhere: Secondary | ICD-10-CM | POA: Diagnosis not present

## 2023-10-30 DIAGNOSIS — J9612 Chronic respiratory failure with hypercapnia: Secondary | ICD-10-CM | POA: Diagnosis not present

## 2023-10-30 DIAGNOSIS — J9622 Acute and chronic respiratory failure with hypercapnia: Secondary | ICD-10-CM

## 2023-10-30 DIAGNOSIS — Z6841 Body Mass Index (BMI) 40.0 and over, adult: Secondary | ICD-10-CM | POA: Diagnosis not present

## 2023-10-30 DIAGNOSIS — E874 Mixed disorder of acid-base balance: Secondary | ICD-10-CM | POA: Diagnosis present

## 2023-10-30 DIAGNOSIS — I2723 Pulmonary hypertension due to lung diseases and hypoxia: Secondary | ICD-10-CM | POA: Diagnosis present

## 2023-10-30 DIAGNOSIS — E7220 Disorder of urea cycle metabolism, unspecified: Secondary | ICD-10-CM | POA: Diagnosis present

## 2023-10-30 DIAGNOSIS — K219 Gastro-esophageal reflux disease without esophagitis: Secondary | ICD-10-CM | POA: Diagnosis present

## 2023-10-30 DIAGNOSIS — Y92008 Other place in unspecified non-institutional (private) residence as the place of occurrence of the external cause: Secondary | ICD-10-CM | POA: Diagnosis not present

## 2023-10-30 DIAGNOSIS — J9621 Acute and chronic respiratory failure with hypoxia: Secondary | ICD-10-CM | POA: Diagnosis present

## 2023-10-30 DIAGNOSIS — E662 Morbid (severe) obesity with alveolar hypoventilation: Secondary | ICD-10-CM | POA: Diagnosis present

## 2023-10-30 DIAGNOSIS — J9601 Acute respiratory failure with hypoxia: Secondary | ICD-10-CM

## 2023-10-30 DIAGNOSIS — Z833 Family history of diabetes mellitus: Secondary | ICD-10-CM | POA: Diagnosis not present

## 2023-10-30 DIAGNOSIS — G9341 Metabolic encephalopathy: Secondary | ICD-10-CM

## 2023-10-30 DIAGNOSIS — E878 Other disorders of electrolyte and fluid balance, not elsewhere classified: Secondary | ICD-10-CM | POA: Diagnosis present

## 2023-10-30 DIAGNOSIS — R6521 Severe sepsis with septic shock: Secondary | ICD-10-CM | POA: Diagnosis not present

## 2023-10-30 DIAGNOSIS — G4733 Obstructive sleep apnea (adult) (pediatric): Secondary | ICD-10-CM | POA: Diagnosis not present

## 2023-10-30 DIAGNOSIS — Z7982 Long term (current) use of aspirin: Secondary | ICD-10-CM | POA: Diagnosis not present

## 2023-10-30 DIAGNOSIS — J440 Chronic obstructive pulmonary disease with acute lower respiratory infection: Secondary | ICD-10-CM | POA: Diagnosis present

## 2023-10-30 DIAGNOSIS — A4151 Sepsis due to Escherichia coli [E. coli]: Secondary | ICD-10-CM | POA: Diagnosis present

## 2023-10-30 DIAGNOSIS — Z96652 Presence of left artificial knee joint: Secondary | ICD-10-CM | POA: Diagnosis present

## 2023-10-30 DIAGNOSIS — R0689 Other abnormalities of breathing: Secondary | ICD-10-CM | POA: Diagnosis present

## 2023-10-30 DIAGNOSIS — Z79899 Other long term (current) drug therapy: Secondary | ICD-10-CM | POA: Diagnosis not present

## 2023-10-30 DIAGNOSIS — R6520 Severe sepsis without septic shock: Secondary | ICD-10-CM | POA: Diagnosis not present

## 2023-10-30 DIAGNOSIS — G894 Chronic pain syndrome: Secondary | ICD-10-CM | POA: Diagnosis present

## 2023-10-30 DIAGNOSIS — Z993 Dependence on wheelchair: Secondary | ICD-10-CM | POA: Diagnosis not present

## 2023-10-30 LAB — BLOOD GAS, ARTERIAL
Acid-Base Excess: 25.2 mmol/L — ABNORMAL HIGH (ref 0.0–2.0)
Acid-Base Excess: 26.1 mmol/L — ABNORMAL HIGH (ref 0.0–2.0)
Bicarbonate: 56.7 mmol/L — ABNORMAL HIGH (ref 20.0–28.0)
Bicarbonate: 50.5 mmol/L — ABNORMAL HIGH (ref 20.0–28.0)
Delivery systems: POSITIVE
Drawn by: 23532
Drawn by: 20012
Expiratory PAP: 8 cmH2O
FIO2: 1 %
FIO2: 40 %
Inspiratory PAP: 23 cmH2O
O2 Content: 40 L/min
O2 Saturation: 98.2 %
O2 Saturation: 100 %
Patient temperature: 36.3
Patient temperature: 37.9
pCO2 arterial: 95 mmHg (ref 32–48)
pCO2 arterial: 50 mmHg — ABNORMAL HIGH (ref 32–48)
pH, Arterial: 7.38 (ref 7.35–7.45)
pH, Arterial: 7.62 (ref 7.35–7.45)
pO2, Arterial: 86 mmHg (ref 83–108)
pO2, Arterial: 149 mmHg — ABNORMAL HIGH (ref 83–108)

## 2023-10-30 LAB — GLUCOSE, CAPILLARY
Glucose-Capillary: 104 mg/dL — ABNORMAL HIGH (ref 70–99)
Glucose-Capillary: 114 mg/dL — ABNORMAL HIGH (ref 70–99)
Glucose-Capillary: 172 mg/dL — ABNORMAL HIGH (ref 70–99)
Glucose-Capillary: 111 mg/dL — ABNORMAL HIGH (ref 70–99)
Glucose-Capillary: 164 mg/dL — ABNORMAL HIGH (ref 70–99)
Glucose-Capillary: 154 mg/dL — ABNORMAL HIGH (ref 70–99)

## 2023-10-30 LAB — URINALYSIS, W/ REFLEX TO CULTURE (INFECTION SUSPECTED)
Bilirubin Urine: NEGATIVE
Glucose, UA: NEGATIVE mg/dL
Ketones, ur: NEGATIVE mg/dL
Nitrite: POSITIVE — AB
Protein, ur: 30 mg/dL — AB
Specific Gravity, Urine: 1.018 (ref 1.005–1.030)
pH: 5 (ref 5.0–8.0)

## 2023-10-30 LAB — CBC WITH DIFFERENTIAL/PLATELET
Abs Immature Granulocytes: 0.04 K/uL (ref 0.00–0.07)
Basophils Absolute: 0 K/uL (ref 0.0–0.1)
Basophils Relative: 0 %
Eosinophils Absolute: 0.1 K/uL (ref 0.0–0.5)
Eosinophils Relative: 1 %
HCT: 43.8 % (ref 39.0–52.0)
Hemoglobin: 12.3 g/dL — ABNORMAL LOW (ref 13.0–17.0)
Immature Granulocytes: 0 %
Lymphocytes Relative: 9 %
Lymphs Abs: 0.9 K/uL (ref 0.7–4.0)
MCH: 28.1 pg (ref 26.0–34.0)
MCHC: 28.1 g/dL — ABNORMAL LOW (ref 30.0–36.0)
MCV: 100.2 fL — ABNORMAL HIGH (ref 80.0–100.0)
Monocytes Absolute: 0.4 K/uL (ref 0.1–1.0)
Monocytes Relative: 4 %
Neutro Abs: 8.6 K/uL — ABNORMAL HIGH (ref 1.7–7.7)
Neutrophils Relative %: 86 %
Platelets: 295 K/uL (ref 150–400)
RBC: 4.37 MIL/uL (ref 4.22–5.81)
RDW: 13.2 % (ref 11.5–15.5)
WBC: 10.1 K/uL (ref 4.0–10.5)
nRBC: 0.2 % (ref 0.0–0.2)

## 2023-10-30 LAB — COMPREHENSIVE METABOLIC PANEL WITH GFR
ALT: 19 U/L (ref 0–44)
AST: 18 U/L (ref 15–41)
Albumin: 3 g/dL — ABNORMAL LOW (ref 3.5–5.0)
Alkaline Phosphatase: 71 U/L (ref 38–126)
Anion gap: 11 (ref 5–15)
BUN: 17 mg/dL (ref 6–20)
CO2: 42 mmol/L — ABNORMAL HIGH (ref 22–32)
Calcium: 8.9 mg/dL (ref 8.9–10.3)
Chloride: 87 mmol/L — ABNORMAL LOW (ref 98–111)
Creatinine, Ser: 0.53 mg/dL — ABNORMAL LOW (ref 0.61–1.24)
GFR, Estimated: >60 mL/min (ref >60)
Glucose, Bld: 131 mg/dL — ABNORMAL HIGH (ref 70–99)
Potassium: 4.2 mmol/L (ref 3.5–5.1)
Sodium: 140 mmol/L (ref 135–145)
Total Bilirubin: 0.6 mg/dL (ref 0.0–1.2)
Total Protein: 7.7 g/dL (ref 6.5–8.1)

## 2023-10-30 LAB — AMMONIA: Ammonia: 49 umol/L — ABNORMAL HIGH (ref 9–35)

## 2023-10-30 LAB — ECHOCARDIOGRAM COMPLETE
Area-P 1/2: 3.63 cm2
Height: 71 in
S' Lateral: 3.4 cm
Weight: 5975.35 [oz_av]

## 2023-10-30 LAB — PROTIME-INR
INR: 1 (ref 0.8–1.2)
Prothrombin Time: 13.9 s (ref 11.4–15.2)

## 2023-10-30 LAB — RESP PANEL BY RT-PCR (RSV, FLU A&B, COVID)  RVPGX2
Influenza A by PCR: NEGATIVE
Influenza B by PCR: NEGATIVE
Resp Syncytial Virus by PCR: NEGATIVE
SARS Coronavirus 2 by RT PCR: NEGATIVE

## 2023-10-30 LAB — HIV ANTIBODY (ROUTINE TESTING W REFLEX): HIV Screen 4th Generation wRfx: NONREACTIVE

## 2023-10-30 LAB — MRSA NEXT GEN BY PCR, NASAL: MRSA by PCR Next Gen: NOT DETECTED

## 2023-10-30 LAB — CK: Total CK: 12 U/L — ABNORMAL LOW (ref 49–397)

## 2023-10-30 LAB — T4, FREE: Free T4: 0.8 ng/dL (ref 0.61–1.12)

## 2023-10-30 LAB — I-STAT CG4 LACTIC ACID, ED: Lactic Acid, Venous: 1 mmol/L (ref 0.5–1.9)

## 2023-10-30 LAB — TROPONIN I (HIGH SENSITIVITY): Troponin I (High Sensitivity): 15 ng/L (ref <18)

## 2023-10-30 LAB — TSH: TSH: 1.801 u[IU]/mL (ref 0.350–4.500)

## 2023-10-30 LAB — HEMOGLOBIN A1C
Hgb A1c MFr Bld: 5.2 % (ref 4.8–5.6)
Mean Plasma Glucose: 102.54 mg/dL

## 2023-10-30 LAB — MAGNESIUM: Magnesium: 2.2 mg/dL (ref 1.7–2.4)

## 2023-10-30 MED ORDER — NONFORMULARY OR COMPOUNDED ITEM: 1300.0000 ug/d | Status: DC

## 2023-10-30 MED ORDER — DOCUSATE SODIUM 100 MG PO CAPS: 100.0000 mg | ORAL_CAPSULE | Freq: Two times a day (BID) | ORAL | Status: DC | PRN

## 2023-10-30 MED ORDER — LACTATED RINGERS IV BOLUS
30.0000 mL/kg | Freq: Once | INTRAVENOUS | Status: AC
  Administered 2023-10-30: 1000 mL via INTRAVENOUS

## 2023-10-30 MED ORDER — NONFORMULARY OR COMPOUNDED ITEM
Status: DC
  Filled 2023-10-30: qty 1

## 2023-10-30 MED ORDER — ENOXAPARIN SODIUM 40 MG/0.4ML IJ SOSY: 40.0000 mg | PREFILLED_SYRINGE | INTRAMUSCULAR | Status: DC

## 2023-10-30 MED ORDER — METRONIDAZOLE 500 MG/100ML IV SOLN
500.0000 mg | Freq: Once | INTRAVENOUS | Status: AC
  Administered 2023-10-30: 500 mg via INTRAVENOUS
  Filled 2023-10-30: qty 100

## 2023-10-30 MED ORDER — ENOXAPARIN SODIUM 30 MG/0.3ML IJ SOSY
30.0000 mg | PREFILLED_SYRINGE | Freq: Two times a day (BID) | INTRAMUSCULAR | Status: DC
  Administered 2023-10-30 – 2023-11-09 (×21): 30 mg via SUBCUTANEOUS
  Filled 2023-10-30 (×21): qty 0.3

## 2023-10-30 MED ORDER — VANCOMYCIN HCL IN DEXTROSE 1-5 GM/200ML-% IV SOLN: 1000.0000 mg | Freq: Once | INTRAVENOUS | Status: DC

## 2023-10-30 MED ORDER — LACTATED RINGERS IV SOLN: INTRAVENOUS | Status: DC

## 2023-10-30 MED ORDER — INSULIN ASPART 100 UNIT/ML IJ SOLN
0.0000 [IU] | INTRAMUSCULAR | Status: DC
  Administered 2023-10-30: 2 [IU] via SUBCUTANEOUS
  Administered 2023-10-31: 1 [IU] via SUBCUTANEOUS
  Administered 2023-10-31: 2 [IU] via SUBCUTANEOUS
  Administered 2023-10-31 – 2023-11-04 (×12): 1 [IU] via SUBCUTANEOUS
  Filled 2023-10-30: qty 0.09

## 2023-10-30 MED ORDER — SODIUM CHLORIDE 0.9 % IV SOLN
2.0000 g | Freq: Once | INTRAVENOUS | Status: AC
  Administered 2023-10-30: 2 g via INTRAVENOUS
  Filled 2023-10-30: qty 12.5

## 2023-10-30 MED ORDER — NYSTATIN 100000 UNIT/GM EX POWD
Freq: Three times a day (TID) | CUTANEOUS | Status: DC
  Filled 2023-10-30 (×3): qty 15
  Filled 2023-10-30: qty 30

## 2023-10-30 MED ORDER — PANTOPRAZOLE SODIUM 40 MG IV SOLR
40.0000 mg | INTRAVENOUS | Status: DC
  Administered 2023-10-30 – 2023-11-01 (×2): 40 mg via INTRAVENOUS
  Filled 2023-10-30 (×2): qty 10

## 2023-10-30 MED ORDER — LACTULOSE 10 GM/15ML PO SOLN
10.0000 g | Freq: Two times a day (BID) | ORAL | Status: AC
  Administered 2023-10-30 – 2023-10-31 (×2): 10 g via ORAL
  Filled 2023-10-30 (×2): qty 15

## 2023-10-30 MED ORDER — CHLORHEXIDINE GLUCONATE CLOTH 2 % EX PADS
6.0000 | MEDICATED_PAD | Freq: Every day | CUTANEOUS | Status: DC
  Administered 2023-10-30 – 2023-11-09 (×11): 6 via TOPICAL

## 2023-10-30 MED ORDER — SODIUM CHLORIDE 0.9 % IV SOLN
2.0000 g | INTRAVENOUS | Status: DC
  Administered 2023-10-30 – 2023-11-02 (×4): 2 g via INTRAVENOUS
  Filled 2023-10-30 (×4): qty 20

## 2023-10-30 MED ORDER — NONFORMULARY OR COMPOUNDED ITEM
1.0000 | Status: DC
  Filled 2023-10-30: qty 1

## 2023-10-30 MED ORDER — VANCOMYCIN HCL 2000 MG/400ML IV SOLN
2000.0000 mg | Freq: Once | INTRAVENOUS | Status: AC
  Administered 2023-10-30: 2000 mg via INTRAVENOUS
  Filled 2023-10-30: qty 400

## 2023-10-30 MED ORDER — PERFLUTREN LIPID MICROSPHERE
1.0000 mL | INTRAVENOUS | Status: AC | PRN
  Administered 2023-10-30: 3 mL via INTRAVENOUS

## 2023-10-30 MED ORDER — POLYETHYLENE GLYCOL 3350 17 G PO PACK: 17.0000 g | PACK | Freq: Every day | ORAL | Status: DC | PRN

## 2023-10-30 MED ORDER — ACETAZOLAMIDE 250 MG PO TABS
500.0000 mg | ORAL_TABLET | Freq: Once | ORAL | Status: AC
  Administered 2023-10-30: 500 mg via ORAL
  Filled 2023-10-30: qty 2

## 2023-10-30 NOTE — Plan of Care (Signed)

## 2023-10-30 NOTE — ED Provider Notes (Signed)
  EMERGENCY DEPARTMENT AT [facility] Provider Note   CSN: [id number] Arrival date & time: 10/29/23  2256     Patient presents with: Altered Mental Status (PT BIB [facility]. PT has Hx of UTI  PT's wife told EMS this is how he usually acts when he becomes septic. PT was found by EMS laying in his own urine and feces from several days ago. PT had home aid that came yesterday but PT wouldn't let her clean him up. PT was found on the floor by FD and was back in bed by the time EMS arrived. PT apparently fell out of his powerchair.)   [name] is a 58 y.o. male.   The history is provided by the EMS personnel. The history is limited by the condition of the patient.  Altered Mental Status Presenting symptoms: confusion and partial responsiveness   Severity:  Severe Most recent episode:  Today Episode history:  Single Timing:  Constant Progression:  Unchanged Chronicity:  New Context: not dementia, not drug use, not head injury and not homeless   Associated symptoms: no abdominal pain and no fever   Associated symptoms comment:  Hypoxia to the 70s      Prior to Admission medications   Medication Sig Start Date End Date Taking? Authorizing Provider  acetaminophen  (TYLENOL ) 500 MG tablet Take 1,000 mg by mouth every 6 (six) hours as needed for mild pain or moderate pain.     [provider]  aspirin  EC 81 MG tablet Take 81 mg by mouth daily.    [provider]  clotrimazole -betamethasone  (LOTRISONE ) cream Apply to affected area 2 times day for one week Patient taking differently: Apply 1 Application topically 2 (two) times daily as needed (rash). 10/31/21   Suzette Pac, MD  DULoxetine  (CYMBALTA ) 30 MG capsule Take 90 mg by mouth daily.    [provider]  esomeprazole  (NEXIUM ) 40 MG capsule Take 1 capsule (40 mg total) by mouth daily. Patient taking differently: Take 40 mg by mouth daily as needed (heart burn). 12/27/18 08/23/22  Tanya Glisson, MD  furosemide  (LASIX ) 40 MG tablet Take 40 mg by mouth daily as needed for fluid or edema. Patient not taking: Reported on 08/23/2022    [provider]  HYDROmorphone  (DILAUDID ) 4 MG tablet Take 1 tablet (4 mg) by mouth 4 times a day as needed Patient taking differently: Take 4 mg by mouth every 6 (six) hours as needed for moderate pain or severe pain. 04/18/22     morphine  (MS CONTIN ) 30 MG 12 hr tablet Take 1 tablet (30 mg total) by mouth every 12 (twelve) hours. Must last 30 days. Do not break tablet 01/14/22 08/23/22  Tanya Glisson, MD  naloxone  (NARCAN ) nasal spray 4 mg/0.1 mL Place 1 spray into the nose as needed for up to 365 doses (for opioid-induced respiratory depresssion). In case of emergency (overdose), spray once into each nostril. If no response within 3 minutes, repeat application and call 911. 11/21/21 11/21/22  Tanya Glisson, MD  nystatin  (MYCOSTATIN /NYSTOP ) powder Apply topically 2 (two) times daily. Patient not taking: Reported on 08/23/2022 04/04/22   Samtani, Jai-Gurmukh, MD  promethazine  (PHENERGAN ) 25 MG tablet Take 25-50 mg by mouth every 6 (six) hours as needed for nausea or vomiting.  05/26/19   [provider]  Skin Protectants, Misc. (INTERDRY 10X144) SHEE Apply 1 application  topically 2 (two) times daily. Patient not taking: Reported on 08/23/2022 04/04/22   Samtani, Jai-Gurmukh, MD  tiZANidine  (  ZANAFLEX ) 4 MG tablet Take 1 tablet (4 mg total) by mouth every 8 (eight) hours as needed for muscle spasms. 10/20/19 08/23/22  Tanya Glisson, MD    Allergies: Tape    Review of Systems  Unable to perform ROS: Acuity of condition  Constitutional:  Negative for fever.  Respiratory:  Negative for wheezing and stridor.   Gastrointestinal:  Negative for abdominal pain.  Psychiatric/Behavioral:  Positive for confusion and decreased concentration.     Updated Vital Signs BP 125/81   Pulse 100   Temp 98 F (36.7 C)   Resp (!) 25   Wt (!)  155 kg   SpO2 94%   BMI 47.66 kg/m   Physical Exam Vitals and nursing note reviewed. Exam conducted with a chaperone present.  Constitutional:      General: He is not in acute distress.    Appearance: Normal appearance. He is well-developed. He is not diaphoretic.  HENT:     Head: Normocephalic and atraumatic.     Nose: Nose normal.  Eyes:     Conjunctiva/sclera: Conjunctivae normal.     Pupils: Pupils are equal, round, and reactive to light.  Cardiovascular:     Rate and Rhythm: Regular rhythm. Tachycardia present.     Pulses: Normal pulses.     Heart sounds: Normal heart sounds.  Pulmonary:     Effort: Pulmonary effort is normal.     Breath sounds: Rhonchi present. No wheezing or rales.  Abdominal:     General: Bowel sounds are normal.     Palpations: Abdomen is soft.     Tenderness: There is no abdominal tenderness. There is no guarding or rebound.  Musculoskeletal:        General: Normal range of motion.     Cervical back: Normal range of motion and neck supple.  Skin:    General: Skin is warm and dry.     Capillary Refill: Capillary refill takes less than 2 seconds.  Neurological:     Mental Status: He is alert.     GCS: GCS eye subscore is 3. GCS verbal subscore is 3. GCS motor subscore is 6.     Deep Tendon Reflexes: Reflexes normal.     (all labs ordered are listed, but only abnormal results are displayed) Results for orders placed or performed during the hospital encounter of 10/29/23  CBG monitoring, ED   Collection Time: 10/29/23 11:05 PM  Result Value Ref Range   Glucose-Capillary 137 (H) 70 - 99 mg/dL  CBC with Differential   Collection Time: 10/30/23 12:00 AM  Result Value Ref Range   WBC 10.1 4.0 - 10.5 K/uL   RBC 4.37 4.22 - 5.81 MIL/uL   Hemoglobin 12.3 (L) 13.0 - 17.0 g/dL   HCT 56.1 60.9 - 47.9 %   MCV 100.2 (H) 80.0 - 100.0 fL   MCH 28.1 26.0 - 34.0 pg   MCHC 28.1 (L) 30.0 - 36.0 g/dL   RDW 86.7 88.4 - 84.4 %   Platelets 295 150 - 400 K/uL    nRBC 0.2 0.0 - 0.2 %   Neutrophils Relative % 86 %   Neutro Abs 8.6 (H) 1.7 - 7.7 K/uL   Lymphocytes Relative 9 %   Lymphs Abs 0.9 0.7 - 4.0 K/uL   Monocytes Relative 4 %   Monocytes Absolute 0.4 0.1 - 1.0 K/uL   Eosinophils Relative 1 %   Eosinophils Absolute 0.1 0.0 - 0.5 K/uL   Basophils Relative 0 %   Basophils  Absolute 0.0 0.0 - 0.1 K/uL   Immature Granulocytes 0 %   Abs Immature Granulocytes 0.04 0.00 - 0.07 K/uL  Blood gas, arterial (at [facility])   Collection Time: 10/30/23 12:40 AM  Result Value Ref Range   FIO2 1.0 %   Delivery systems NON-REBREATHER OXYGEN  MASK    pH, Arterial 7.38 7.35 - 7.45   pCO2 arterial 95 (HH) 32 - 48 mmHg   pO2, Arterial 86 83 - 108 mmHg   Bicarbonate 56.7 (H) 20.0 - 28.0 mmol/L   Acid-Base Excess 25.2 (H) 0.0 - 2.0 mmol/L   O2 Saturation 98.2 %   Patient temperature 36.3    Collection site RIGHT RADIAL    Drawn by [id number]    Allens test (pass/fail) PASS PASS  Urinalysis, w/ Reflex to Culture (Infection Suspected) -Urine, Clean Catch   Collection Time: 10/30/23 12:52 AM  Result Value Ref Range   Specimen Source URINE, CLEAN CATCH    Color, Urine AMBER (A) YELLOW   APPearance HAZY (A) CLEAR   Specific Gravity, Urine 1.018 1.005 - 1.030   pH 5.0 5.0 - 8.0   Glucose, UA NEGATIVE NEGATIVE mg/dL   Hgb urine dipstick SMALL (A) NEGATIVE   Bilirubin Urine NEGATIVE NEGATIVE   Ketones, ur NEGATIVE NEGATIVE mg/dL   Protein, ur 30 (A) NEGATIVE mg/dL   Nitrite POSITIVE (A) NEGATIVE   Leukocytes,Ua MODERATE (A) NEGATIVE   RBC / HPF 0-5 0 - 5 RBC/hpf   WBC, UA 21-50 0 - 5 WBC/hpf   Bacteria, UA MANY (A) NONE SEEN   Squamous Epithelial / HPF 0-5 0 - 5 /HPF   Mucus PRESENT    Hyaline Casts, UA PRESENT   Resp panel by RT-PCR (RSV, Flu A&B, Covid) Anterior Nasal Swab   Collection Time: 10/30/23  1:00 AM   Specimen: Anterior Nasal Swab  Result Value Ref Range   SARS Coronavirus 2 by RT PCR NEGATIVE NEGATIVE   Influenza A by PCR NEGATIVE NEGATIVE    Influenza B by PCR NEGATIVE NEGATIVE   Resp Syncytial Virus by PCR NEGATIVE NEGATIVE  Comprehensive metabolic panel with GFR   Collection Time: 10/30/23  1:00 AM  Result Value Ref Range   Sodium 140 135 - 145 mmol/L   Potassium 4.2 3.5 - 5.1 mmol/L   Chloride 87 (L) 98 - 111 mmol/L   CO2 42 (H) 22 - 32 mmol/L   Glucose, Bld 131 (H) 70 - 99 mg/dL   BUN 17 6 - 20 mg/dL   Creatinine, Ser 9.46 (L) 0.61 - 1.24 mg/dL   Calcium  8.9 8.9 - 10.3 mg/dL   Total Protein 7.7 6.5 - 8.1 g/dL   Albumin 3.0 (L) 3.5 - 5.0 g/dL   AST 18 15 - 41 U/L   ALT 19 0 - 44 U/L   Alkaline Phosphatase 71 38 - 126 U/L   Total Bilirubin 0.6 0.0 - 1.2 mg/dL   GFR, Estimated >39 >39 mL/min   Anion gap 11 5 - 15  I-Stat Lactic Acid, ED   Collection Time: 10/30/23  1:12 AM  Result Value Ref Range   Lactic Acid, Venous 1.0 0.5 - 1.9 mmol/L  Protime-INR   Collection Time: 10/30/23  2:43 AM  Result Value Ref Range   Prothrombin Time 13.9 11.4 - 15.2 seconds   INR 1.0 0.8 - 1.2   DG Chest Port 1 View Result Date: 10/30/2023 CLINICAL DATA:  Possible sepsis EXAM: PORTABLE CHEST 1 VIEW COMPARISON:  08/22/2022 FINDINGS: Patient rotation to  the right accentuates the mediastinal markings. Cardiac shadow is enlarged. Increased central vascular congestion with edema is noted. No effusion is seen. No bony abnormality is noted. IMPRESSION: Changes of mild CHF. Electronically Signed   By: Oneil Devonshire M.D.   On: 10/30/2023 00:25    None  Radiology: [facility] Chest Port 1 View Result Date: 10/30/2023 CLINICAL DATA:  Possible sepsis EXAM: PORTABLE CHEST 1 VIEW COMPARISON:  08/22/2022 FINDINGS: Patient rotation to the right accentuates the mediastinal markings. Cardiac shadow is enlarged. Increased central vascular congestion with edema is noted. No effusion is seen. No bony abnormality is noted. IMPRESSION: Changes of mild CHF. Electronically Signed   By: Oneil Devonshire M.D.   On: 10/30/2023 00:25     .Critical Care  Performed by:  Nettie Earing, MD Authorized by: Nettie Earing, MD   Critical care provider statement:    Critical care time (minutes):  60   Critical care end time:  10/30/2023 5:25 AM   Critical care was necessary to treat or prevent imminent or life-threatening deterioration of the following conditions:  Respiratory failure and sepsis   Critical care was time spent personally by me on the following activities:  Development of treatment plan with patient or surrogate, discussions with consultants, evaluation of patient's response to treatment, examination of patient, ordering and review of laboratory studies, ordering and review of radiographic studies, ordering and performing treatments and interventions, pulse oximetry, re-evaluation of patient's condition and review of old charts   I assumed direction of critical care for this patient from another provider in my specialty: no     Care discussed with: admitting provider      Medications Ordered in the ED  docusate sodium  (COLACE) capsule 100 mg (has no administration in time range)  polyethylene glycol (MIRALAX  / GLYCOLAX ) packet 17 g (has no administration in time range)  insulin  aspart (novoLOG ) injection 0-9 Units (has no administration in time range)  cefTRIAXone  (ROCEPHIN ) 2 g in sodium chloride  0.9 % 100 mL IVPB (has no administration in time range)  nystatin  (MYCOSTATIN /NYSTOP ) topical powder (has no administration in time range)  Chlorhexidine  Gluconate Cloth 2 % PADS 6 each (has no administration in time range)  enoxaparin  (LOVENOX ) injection 30 mg (has no administration in time range)  pantoprazole  (PROTONIX ) injection 40 mg (has no administration in time range)  ceFEPIme  (MAXIPIME ) 2 g in sodium chloride  0.9 % 100 mL IVPB (0 g Intravenous Stopped 10/30/23 0150)  metroNIDAZOLE  (FLAGYL ) IVPB 500 mg (0 mg Intravenous Stopped 10/30/23 0220)  vancomycin  (VANCOREADY) IVPB 2000 mg/400 mL (2,000 mg Intravenous New Bag/Given 10/30/23 0112)  lactated  ringers  bolus 4,650 mL (1,000 mLs Intravenous New Bag/Given 10/30/23 0235)                                    Medical Decision Making Found down with a h/o sepsis from urine  Amount and/or Complexity of Data Reviewed Independent Historian: EMS    Details: See above  External Data Reviewed: notes.    Details: Previous notes reviewed  Labs: ordered.    Details: Urine is consistent with UTI.  CO2 on ABG > 95 consistent with compensated hypercapnia.  Lactate normal 1, white count normal 12.3, hemoglobin low 10.3, normal platelets. Normal sodium 140, normal potassium 4.2, normal creatinine 0.53  Radiology: ordered and independent interpretation performed.    Details: Mild cephalization by me   Risk Prescription drug management. Decision regarding  hospitalization.     Final diagnoses:  Hypercapnia  Sepsis, due to unspecified organism, unspecified whether acute organ dysfunction present [facility])   The patient appears reasonably stabilized for admission considering the current resources, flow, and capabilities available in the ED at this time, and I doubt any other [facility] requiring further screening and/or treatment in the ED prior to admission.  ED Discharge Orders     None          Ananiah Maciolek, MD 10/30/23 [id number]

## 2023-10-30 NOTE — Progress Notes (Signed)
 eLink Physician-Brief Progress Note Patient Name: [name] DOB: [birth year] MRN: [id number]   Date of Service  10/30/2023  HPI/Events of Note  Pt pulled out his IV, refusing to have it reinserted.  Also refusing insulin  [place].   eICU Interventions  Noted.  Pt alert, awake, oriented.  Aware of the reasons and consequences of his  actions.         Snigdha Howser M DELA CRUZ 10/30/2023, 8:08 PM

## 2023-10-30 NOTE — Progress Notes (Signed)
 Patient is now awake alert and interactive  Will take him off BiPAP  ABG noted  Will give dose of Diamox, start lactulose  orally Diet placed

## 2023-10-30 NOTE — Progress Notes (Signed)
 eLink Physician-Brief Progress Note Patient Name: [name] DOB: 1965/09/22 MRN: [id number]   Date of Service  10/30/2023  HPI/Events of Note  58 yr old male patient with OSA/OHS, morbid obesity, D-CHF, and GERD who had UTI and sepsis before leading to AMS.  Vital signs within normal limits, patient is saturating 99% on BiPAP therapy with 50% FiO2.  He is status post crystalloid resuscitation and broad-spectrum antibiotics.  Laboratory studies show compensated respiratory acidosis with metabolic alkalosis and grossly positive urinalysis with bacteriuria.  Cultures have been obtained.  Radiograph concerning for mild fluid overload  eICU Interventions  Continue BiPAP therapy and transferred from antibiotics  Echo pending, maintain euvolemia  There is a component of polypharmacy with tizanidine  and large doses of opiates at home, will need to monitor for withdrawal  DVT prophylaxis with enoxaparin  GI prophylaxis not indicated, consider discontinuation of pantoprazole      Intervention Category Evaluation Type: New Patient Evaluation  [name] 10/30/2023, 6:18 AM

## 2023-10-30 NOTE — ED Notes (Signed)
 Date and time results received: 10/30/23 1:07 AM  (use smartphrase .now to insert current time)  Test: PCO2 Critical Value: 95  Name of Provider Notified: Palumbo  Orders Received? Or Actions Taken?: MAR

## 2023-10-30 NOTE — H&P (Signed)
 NAME:  [name], MRN:  [id number], DOB:  [birth year], LOS: 0 ADMISSION DATE:  10/29/2023, CONSULTATION DATE:  10/30/2023 REFERRING MD: Nettie Earing, MD, CHIEF COMPLAINT:  AMS for 2 days   History of Present Illness:  A 58 yr old male patient with OSA/OHS, morbid obesity, D-CHF, and GERD who had UTI and sepsis before leading to AMS per his wife. He has AMS for 2 days, he was found by EMS laying in his own urine and feces from 2 days. He has home aid that came yesterday but she wouldn't let her clean him up. He apparently fell out of his powerchair. Currently, he is altered on BiPAP and he cannot provide hx.   Pertinent  Medical History  OSA/OHS, morbid obesity, D-CHF, GERD, UTI sepsis   Significant Hospital Events: Including procedures, antibiotic start and stop dates in addition to other pertinent events   7/18: admit to ICU. Given 1.5 L LR, Vanco, Flagyl , and Cefepime  in ED.    Interim History / Subjective:    Objective    Blood pressure 125/81, pulse 100, temperature 98 F (36.7 C), resp. rate (!) 25, weight (!) 155 kg, SpO2 94%.    FiO2 (%):  [60 %-80 %] 60 % PEEP:  [8 cmH20] 8 cmH20   Intake/Output Summary (Last 24 hours) at 10/30/2023 0401 Last data filed at 10/30/2023 0220 Gross per 24 hour  Intake 550 ml  Output --  Net 550 ml   Filed Weights   10/29/23 2317  Weight: (!) 155 kg    Examination: General: lethargic, disoriented, and comfortable. On BiPAP 14/8/60%, backup RR 18 RA. SpO2 99%  HENT: PERL, normal pharynx and oral mucosa. No LNE or thyromegaly. No JVD Lungs: symmetrical air entry bilaterally. No crackles or wheezing Cardiovascular: NL S1/S2. No m/g/r Abdomen: no distension or tenderness. Right groin candida infection Extremities: no edema. Symmetrical  Neuro: arousable to voices briefly. Able to squeeze hands and wiggle toes.   Resolved problem list   Assessment and Plan  Metabolic encephalopathy due to UTI/sepsis  -Monitor -NPO -HOB  elevation -TFT -Ammonia -Mg  UTI/sepsis -f/u Cx -Abx -Glycemic control   Chronic hypercapnic resp failure OSA/OHS -BiPAP -Goal SpO2 90-92%  Acute hypoxic resp failure due to D-CHF exacerbation -hold IVF -Echo  Fall -CK -CT head wo contrast   Morbid obesity -HbA1c  Best Practice (right click and Reselect all SmartList Selections daily)   Diet/type: NPO w/ oral meds DVT prophylaxis LMWH Pressure ulcer(s): N/A GI prophylaxis: PPI Lines: N/A Foley:  N/A Code Status:  full code Last date of multidisciplinary goals of care discussion []   Labs   CBC: Recent Labs  Lab 10/30/23 0000  WBC 10.1  NEUTROABS 8.6*  HGB 12.3*  HCT 43.8  MCV 100.2*  PLT 295    Basic Metabolic Panel: Recent Labs  Lab 10/30/23 0100  NA 140  K 4.2  CL 87*  CO2 42*  GLUCOSE 131*  BUN 17  CREATININE 0.53*  CALCIUM  8.9   GFR: CrCl cannot be calculated (Unknown ideal weight.). Recent Labs  Lab 10/30/23 0000 10/30/23 0112  WBC 10.1  --   LATICACIDVEN  --  1.0    Liver Function Tests: Recent Labs  Lab 10/30/23 0100  AST 18  ALT 19  ALKPHOS 71  BILITOT 0.6  PROT 7.7  ALBUMIN 3.0*   No results for input(s): LIPASE, AMYLASE in the last 168 hours. No results for input(s): AMMONIA in the last 168 hours.  ABG  Component Value Date/Time   PHART 7.38 10/30/2023 0040   PCO2ART 95 (HH) 10/30/2023 0040   PO2ART 86 10/30/2023 0040   HCO3 56.7 (H) 10/30/2023 0040   TCO2 30 03/31/2022 1649   O2SAT 98.2 10/30/2023 0040     Coagulation Profile: Recent Labs  Lab 10/30/23 0243  INR 1.0    Cardiac Enzymes: No results for input(s): CKTOTAL, CKMB, CKMBINDEX, TROPONINI in the last 168 hours.  HbA1C: Hgb A1c MFr Bld  Date/Time Value Ref Range Status  06/30/2022 11:41 AM 5.5 4.8 - 5.6 % Final    Comment:    (NOTE)         Prediabetes: 5.7 - 6.4         Diabetes: >6.4         Glycemic control for adults with diabetes: <7.0   04/02/2022 04:08 AM 6.3  (H) 4.8 - 5.6 % Final    Comment:    (NOTE)         Prediabetes: 5.7 - 6.4         Diabetes: >6.4         Glycemic control for adults with diabetes: <7.0     CBG: Recent Labs  Lab 10/29/23 2305  GLUCAP 137*    Review of Systems:   AMS  Past Medical History:  He,  has a past medical history of Arthritis, Asthma, Back pain, CHF (congestive heart failure) (HCC), COPD (chronic obstructive pulmonary disease) (HCC), GERD (gastroesophageal reflux disease), Headache, Hernia, History of hiatal hernia, History of kidney stones, Hypogonadism in male, Neuromuscular disorder (HCC), On home oxygen  therapy, PONV (postoperative nausea and vomiting), Pre-diabetes, Shortness of breath dyspnea, Sleep apnea, Ulcer, and Wears glasses.   Surgical History:   Past Surgical History:  Procedure Laterality Date   BACK SURGERY     CYSTOSCOPY W/ URETERAL STENT PLACEMENT Left 03/31/2022   Procedure: CYSTOSCOPY WITH RETROGRADE PYELOGRAM/URETERAL STENT PLACEMENT;  Surgeon: Devere Lonni Righter, MD;  Location: [facility];  Service: Urology;  Laterality: Left;   CYSTOSCOPY/URETEROSCOPY/HOLMIUM LASER/STENT PLACEMENT Bilateral 07/02/2022   Procedure: CYSTOSCOPY, LEFT STENT REMOVAL, BILATERAL URETEROSCOPY, HOLMIUM LASER, AND RIGHT STENT PLACEMENT;  Surgeon: Devere Lonni Righter, MD;  Location: [facility];  Service: Urology;  Laterality: Bilateral;  90 MINUTES   JOINT REPLACEMENT     Left knee replacement   LAPAROSCOPIC GASTRIC SLEEVE RESECTION N/A 01/08/2015   Procedure: LAPAROSCOPIC GASTRIC SLEEVE RESECTION;  Surgeon: Donnice Lunger, MD;  Location: [facility];  Service: General;  Laterality: N/A;   morphine  pump     RIGHT HEART CATH N/A 09/22/2019   Procedure: RIGHT HEART CATH;  Surgeon: Cherrie Toribio SAUNDERS, MD;  Location: [facility];  Service: Cardiovascular;  Laterality: N/A;   SHOULDER ARTHROSCOPY WITH SUBACROMIAL DECOMPRESSION AND BICEP TENDON REPAIR Left 10/06/2013   Procedure: LEFT SHOULDER ARTHROSCOPY  DEBRIDEMENT EXTENTSIVE,DISTAL CLAVICULECTOMY,DECOMPRESSION SUBACROMIAL PARTIAL ACROMIOPLASTY WITH ROTATOR CUFF REPAIR, and excision of CALCIUM  DEPOSIT;  Surgeon: Toribio JULIANNA Chancy, MD;  Location: [facility];  Service: Orthopedics;  Laterality: Left;   SHOULDER ARTHROSCOPY WITH SUBACROMIAL DECOMPRESSION, ROTATOR CUFF REPAIR AND BICEP TENDON REPAIR Right 08/18/2013   Procedure: RIGHT SHOULDER ARTHROSCOPY WITH SUBACROMIAL DECOMPRESSION/PARTIAL ACROMIOPLASTY WITH CORACOACROMIAL RELEASE/DISTAL CLAVICULECTOMY/ ROTATOR CUFF REPAIR/DEBRIDEMENT EXTENTSIVE;  Surgeon: Toribio JULIANNA Chancy, MD;  Location: [facility];  Service: Orthopedics;  Laterality: Right;  ANESTHESIA: GENERAL, PRE/POST OP SCALENE   TONSILLECTOMY       Social History:   reports that he has never smoked. His smokeless tobacco use includes snuff and chew.  He reports current alcohol use. He reports that he does not use drugs.   Family History:  His family history includes Diabetes in his mother; Pneumonia in his father.   Allergies Allergies  Allergen Reactions   Tape Other (See Comments)    Blisters - tolerates paper tape well     Home Medications  Prior to Admission medications   Medication Sig Start Date End Date Taking? Authorizing Provider  acetaminophen  (TYLENOL ) 500 MG tablet Take 1,000 mg by mouth every 6 (six) hours as needed for mild pain or moderate pain.     [provider]  aspirin  EC 81 MG tablet Take 81 mg by mouth daily.    [provider]  clotrimazole -betamethasone  (LOTRISONE ) cream Apply to affected area 2 times day for one week Patient taking differently: Apply 1 Application topically 2 (two) times daily as needed (rash). 10/31/21   Zammit, Joseph, MD  DULoxetine  (CYMBALTA ) 30 MG capsule Take 90 mg by mouth daily.    [provider]  esomeprazole  (NEXIUM ) 40 MG capsule Take 1 capsule (40 mg total) by mouth daily. Patient taking differently: Take 40 mg by mouth daily  as needed (heart burn). 12/27/18 08/23/22  Tanya Glisson, MD  furosemide  (LASIX ) 40 MG tablet Take 40 mg by mouth daily as needed for fluid or edema. Patient not taking: Reported on 08/23/2022    [provider]  HYDROmorphone  (DILAUDID ) 4 MG tablet Take 1 tablet (4 mg) by mouth 4 times a day as needed Patient taking differently: Take 4 mg by mouth every 6 (six) hours as needed for moderate pain or severe pain. 04/18/22     morphine  (MS CONTIN ) 30 MG 12 hr tablet Take 1 tablet (30 mg total) by mouth every 12 (twelve) hours. Must last 30 days. Do not break tablet 01/14/22 08/23/22  Tanya Glisson, MD  naloxone  (NARCAN ) nasal spray 4 mg/0.1 mL Place 1 spray into the nose as needed for up to 365 doses (for opioid-induced respiratory depresssion). In case of emergency (overdose), spray once into each nostril. If no response within 3 minutes, repeat application and call 911. 11/21/21 11/21/22  Tanya Glisson, MD  nystatin  (MYCOSTATIN /NYSTOP ) powder Apply topically 2 (two) times daily. Patient not taking: Reported on 08/23/2022 04/04/22   Samtani, Jai-Gurmukh, MD  promethazine  (PHENERGAN ) 25 MG tablet Take 25-50 mg by mouth every 6 (six) hours as needed for nausea or vomiting.  05/26/19   [provider]  Skin Protectants, Misc. (INTERDRY 10X144) SHEE Apply 1 application  topically 2 (two) times daily. Patient not taking: Reported on 08/23/2022 04/04/22   Samtani, Jai-Gurmukh, MD  tiZANidine  (ZANAFLEX ) 4 MG tablet Take 1 tablet (4 mg total) by mouth every 8 (eight) hours as needed for muscle spasms. 10/20/19 08/23/22  Tanya Glisson, MD     Critical care time: 43 min    Mancel Ply, MD [facility] Pager: see AMION

## 2023-10-30 NOTE — Sepsis Progress Note (Signed)
 Elink monitoring for the code sepsis protocol.

## 2023-10-30 NOTE — Progress Notes (Signed)
  Echocardiogram 2D Echocardiogram has been performed.  Koleen KANDICE Popper, RDCS 10/30/2023, 8:42 AM

## 2023-10-30 NOTE — Progress Notes (Signed)
 NAME:  [name], MRN:  [id number], DOB:  11-05-1965, LOS: 0 ADMISSION DATE:  10/29/2023, CONSULTATION DATE:  10/30/2023 REFERRING MD: Nettie Earing, MD, CHIEF COMPLAINT:  AMS for 2 days   History of Present Illness:  A 58 yr old male patient with OSA/OHS, morbid obesity, D-CHF, and GERD who had UTI and sepsis before leading to AMS per his wife. He has AMS for 2 days, he was found by EMS laying in his own urine and feces from 2 days. He has home aid that came yesterday but she wouldn't let her clean him up. He apparently fell out of his powerchair. Currently, he is altered on BiPAP and he cannot provide hx.   Pertinent  Medical History  OSA/OHS, morbid obesity, D-CHF, GERD, UTI sepsis   Significant Hospital Events: Including procedures, antibiotic start and stop dates in addition to other pertinent events   7/18: admit to ICU. Given 1.5 L LR, Vanco, Flagyl , and Cefepime  in ED.    Interim History / Subjective:   No overnight events Was not able to get him to respond this morning Remains on BiPAP  Objective    Blood pressure 129/78, pulse 93, temperature 99 F (37.2 C), temperature source Oral, resp. rate 15, height 5' 11 (1.803 m), weight (!) 169.4 kg, SpO2 99%.    FiO2 (%):  [50 %-80 %] 50 % PEEP:  [8 cmH20] 8 cmH20   Intake/Output Summary (Last 24 hours) at 10/30/2023 0815 Last data filed at 10/30/2023 0317 Gross per 24 hour  Intake 2549.8 ml  Output --  Net 2549.8 ml   Filed Weights   10/29/23 2317 10/30/23 0547  Weight: (!) 155 kg (!) 169.4 kg    Examination: General: Unresponsive, on BiPAP  HENT: Mask in place, no thyromegaly, no adenopathy Lungs: Fair air entry, decreased at the bases, no rales Cardiovascular: S1-S2 appreciated Abdomen: Bowel sounds appreciated Extremities: no edema. Symmetrical  Neuro: Unresponsive  I reviewed last 24 h vitals and pain scores, last 48 h intake and output, last 24 h labs and trends, and last 24 h imaging results. Chest  x-ray with pulmonary vascular congestion, hypoventilated lung fields ABG 7.3 8/95/86  Resolved problem list   Assessment and Plan   Metabolic encephalopathy secondary to UTI/sepsis - N.p.o. status with current mental status - Follow and replete electrolytes - Likely related to hypercarbia - Ammonia is elevated, will need to consider lactulose   UTI/sepsis Tmax of 100.2 - Continue to follow cultures - Continue empiric antibiotic therapy-received cefepime , metronidazole , vancomycin  -Continue ceftriaxone  daily -MRSA PCR negative-discontinue vancomycin , discontinue metronidazole  as it does not have diarrhea  Chronic hypercapnic respiratory failure OSA/OHS - Will continue BiPAP - Ordered ABG - BiPAP settings optimized, increased rate to 22, decreased FiO2 to 40% as sats is 99%  Acute hypoxic respiratory failure due to heart failure - Echo pending-last echocardiogram was in 2021 with normal ejection fraction, normal right-sided pressures  S/p fall - CT head negative  Morbid obesity  Best Practice (right click and Reselect all SmartList Selections daily)   Diet/type: NPO w/ oral meds DVT prophylaxis LMWH Pressure ulcer(s): N/A GI prophylaxis: PPI Lines: N/A Foley:  N/A Code Status:  full code Last date of multidisciplinary goals of care discussion []   Labs   CBC: Recent Labs  Lab 10/30/23 0000  WBC 10.1  NEUTROABS 8.6*  HGB 12.3*  HCT 43.8  MCV 100.2*  PLT 295    Basic Metabolic Panel: Recent Labs  Lab 10/30/23 0100 10/30/23 9385  NA 140  --   K 4.2  --   CL 87*  --   CO2 42*  --   GLUCOSE 131*  --   BUN 17  --   CREATININE 0.53*  --   CALCIUM  8.9  --   MG  --  2.2   GFR: Estimated Creatinine Clearance: 164.6 mL/min (A) (by C-G formula based on SCr of 0.53 mg/dL (L)). Recent Labs  Lab 10/30/23 0000 10/30/23 0112  WBC 10.1  --   LATICACIDVEN  --  1.0    Liver Function Tests: Recent Labs  Lab 10/30/23 0100  AST 18  ALT 19  ALKPHOS 71   BILITOT 0.6  PROT 7.7  ALBUMIN 3.0*   No results for input(s): LIPASE, AMYLASE in the last 168 hours. Recent Labs  Lab 10/30/23 0614  AMMONIA 49*    ABG    Component Value Date/Time   PHART 7.38 10/30/2023 0040   PCO2ART 95 (HH) 10/30/2023 0040   PO2ART 86 10/30/2023 0040   HCO3 56.7 (H) 10/30/2023 0040   TCO2 30 03/31/2022 1649   O2SAT 98.2 10/30/2023 0040     Coagulation Profile: Recent Labs  Lab 10/30/23 0243  INR 1.0    Cardiac Enzymes: Recent Labs  Lab 10/30/23 0614  CKTOTAL 12*    HbA1C: Hgb A1c MFr Bld  Date/Time Value Ref Range Status  06/30/2022 11:41 AM 5.5 4.8 - 5.6 % Final    Comment:    (NOTE)         Prediabetes: 5.7 - 6.4         Diabetes: >6.4         Glycemic control for adults with diabetes: <7.0   04/02/2022 04:08 AM 6.3 (H) 4.8 - 5.6 % Final    Comment:    (NOTE)         Prediabetes: 5.7 - 6.4         Diabetes: >6.4         Glycemic control for adults with diabetes: <7.0     CBG: Recent Labs  Lab 10/29/23 2305 10/30/23 0553 10/30/23 0753  GLUCAP 137* 104* 114*    Review of Systems:   AMS  Past Medical History:  He,  has a past medical history of Arthritis, Asthma, Back pain, CHF (congestive heart failure) (HCC), COPD (chronic obstructive pulmonary disease) (HCC), GERD (gastroesophageal reflux disease), Headache, Hernia, History of hiatal hernia, History of kidney stones, Hypogonadism in male, Neuromuscular disorder (HCC), On home oxygen  therapy, PONV (postoperative nausea and vomiting), Pre-diabetes, Shortness of breath dyspnea, Sleep apnea, Ulcer, and Wears glasses.   Surgical History:   Past Surgical History:  Procedure Laterality Date   BACK SURGERY     CYSTOSCOPY W/ URETERAL STENT PLACEMENT Left 03/31/2022   Procedure: CYSTOSCOPY WITH RETROGRADE PYELOGRAM/URETERAL STENT PLACEMENT;  Surgeon: Devere Lonni Righter, MD;  Location: [facility];  Service: Urology;  Laterality: Left;   CYSTOSCOPY/URETEROSCOPY/HOLMIUM  LASER/STENT PLACEMENT Bilateral 07/02/2022   Procedure: CYSTOSCOPY, LEFT STENT REMOVAL, BILATERAL URETEROSCOPY, HOLMIUM LASER, AND RIGHT STENT PLACEMENT;  Surgeon: Devere Lonni Righter, MD;  Location: [facility];  Service: Urology;  Laterality: Bilateral;  90 MINUTES   JOINT REPLACEMENT     Left knee replacement   LAPAROSCOPIC GASTRIC SLEEVE RESECTION N/A 01/08/2015   Procedure: LAPAROSCOPIC GASTRIC SLEEVE RESECTION;  Surgeon: Donnice Lunger, MD;  Location: [facility];  Service: General;  Laterality: N/A;   morphine  pump     RIGHT HEART CATH N/A 09/22/2019   Procedure: RIGHT HEART CATH;  Surgeon: Cherrie Toribio SAUNDERS, MD;  Location[facility];  Service: Cardiovascular;  Laterality: N/A;   SHOULDER ARTHROSCOPY WITH SUBACROMIAL DECOMPRESSION AND BICEP TENDON REPAIR Left 10/06/2013   Procedure: LEFT SHOULDER ARTHROSCOPY DEBRIDEMENT EXTENTSIVE,DISTAL CLAVICULECTOMY,DECOMPRESSION SUBACROMIAL PARTIAL ACROMIOPLASTY WITH ROTATOR CUFF REPAIR, and excision of CALCIUM  DEPOSIT;  Surgeon: Toribio JULIANNA Chancy, MD;  Location: [facility];  Service: Orthopedics;  Laterality: Left;   SHOULDER ARTHROSCOPY WITH SUBACROMIAL DECOMPRESSION, ROTATOR CUFF REPAIR AND BICEP TENDON REPAIR Right 08/18/2013   Procedure: RIGHT SHOULDER ARTHROSCOPY WITH SUBACROMIAL DECOMPRESSION/PARTIAL ACROMIOPLASTY WITH CORACOACROMIAL RELEASE/DISTAL CLAVICULECTOMY/ ROTATOR CUFF REPAIR/DEBRIDEMENT EXTENTSIVE;  Surgeon: Toribio JULIANNA Chancy, MD;  Location: [facility];  Service: Orthopedics;  Laterality: Right;  ANESTHESIA: GENERAL, PRE/POST OP SCALENE   TONSILLECTOMY       Social History:   reports that he has never smoked. His smokeless tobacco use includes snuff and chew. He reports current alcohol use. He reports that he does not use drugs.   Family History:  His family history includes Diabetes in his mother; Pneumonia in his father.   Allergies Allergies  Allergen Reactions   Tape Other (See Comments)    Blisters  - tolerates paper tape well    The patient is critically ill with multiple organ systems failure and requires high complexity decision making for assessment and support, frequent evaluation and titration of therapies, application of advanced monitoring technologies and extensive interpretation of multiple databases. Critical Care Time devoted to patient care services described in this note independent of APP/resident time (if applicable)  is 32 minutes.   Jennet Epley MD [facility] pager: See Amion If unanswered, please page CCM On-call: #[phone]

## 2023-10-31 DIAGNOSIS — G9341 Metabolic encephalopathy: Secondary | ICD-10-CM | POA: Diagnosis not present

## 2023-10-31 DIAGNOSIS — N390 Urinary tract infection, site not specified: Secondary | ICD-10-CM | POA: Diagnosis not present

## 2023-10-31 DIAGNOSIS — J9612 Chronic respiratory failure with hypercapnia: Secondary | ICD-10-CM | POA: Diagnosis not present

## 2023-10-31 DIAGNOSIS — A419 Sepsis, unspecified organism: Secondary | ICD-10-CM | POA: Diagnosis not present

## 2023-10-31 LAB — GLUCOSE, CAPILLARY
Glucose-Capillary: 112 mg/dL — ABNORMAL HIGH (ref 70–99)
Glucose-Capillary: 116 mg/dL — ABNORMAL HIGH (ref 70–99)
Glucose-Capillary: 123 mg/dL — ABNORMAL HIGH (ref 70–99)
Glucose-Capillary: 130 mg/dL — ABNORMAL HIGH (ref 70–99)
Glucose-Capillary: 140 mg/dL — ABNORMAL HIGH (ref 70–99)
Glucose-Capillary: 154 mg/dL — ABNORMAL HIGH (ref 70–99)

## 2023-10-31 LAB — BASIC METABOLIC PANEL WITH GFR
Anion gap: 8 (ref 5–15)
BUN: 14 mg/dL (ref 6–20)
CO2: 39 mmol/L — ABNORMAL HIGH (ref 22–32)
Calcium: 9 mg/dL (ref 8.9–10.3)
Chloride: 92 mmol/L — ABNORMAL LOW (ref 98–111)
Creatinine, Ser: 0.68 mg/dL (ref 0.61–1.24)
GFR, Estimated: >60 mL/min (ref >60)
Glucose, Bld: 152 mg/dL — ABNORMAL HIGH (ref 70–99)
Potassium: 4.5 mmol/L (ref 3.5–5.1)
Sodium: 139 mmol/L (ref 135–145)

## 2023-10-31 LAB — CBC
HCT: 39.7 % (ref 39.0–52.0)
Hemoglobin: 11.1 g/dL — ABNORMAL LOW (ref 13.0–17.0)
MCH: 28 pg (ref 26.0–34.0)
MCHC: 28 g/dL — ABNORMAL LOW (ref 30.0–36.0)
MCV: 100.3 fL — ABNORMAL HIGH (ref 80.0–100.0)
Platelets: 258 K/uL (ref 150–400)
RBC: 3.96 MIL/uL — ABNORMAL LOW (ref 4.22–5.81)
RDW: 13.4 % (ref 11.5–15.5)
WBC: 8 K/uL (ref 4.0–10.5)
nRBC: 0 % (ref 0.0–0.2)

## 2023-10-31 LAB — MAGNESIUM: Magnesium: 2.2 mg/dL (ref 1.7–2.4)

## 2023-10-31 LAB — PHOSPHORUS: Phosphorus: 3 mg/dL (ref 2.5–4.6)

## 2023-10-31 MED ORDER — OXYCODONE-ACETAMINOPHEN 7.5-325 MG PO TABS
1.0000 | ORAL_TABLET | ORAL | Status: DC | PRN
  Administered 2023-10-31 – 2023-11-02 (×6): 1 via ORAL
  Filled 2023-10-31 (×6): qty 1

## 2023-10-31 NOTE — Progress Notes (Signed)
   10/31/23 0915  Oxygen  Therapy/Pulse Ox  O2 Device Nasal Cannula  O2 Therapy Oxygen  humidified  O2 Flow Rate (L/min) 6 L/min (Pt's baseline: home 02.)  FiO2 (%) 44 %  SpO2 93 %  Safety Instructions Yes (Comment)  Oxygen  On/Off  Continued

## 2023-10-31 NOTE — Plan of Care (Signed)
  Problem: Fluid Volume: Goal: Ability to maintain a balanced intake and output will improve Outcome: Progressing   Problem: Metabolic: Goal: Ability to maintain appropriate glucose levels will improve Outcome: Progressing   Problem: Nutritional: Goal: Maintenance of adequate nutrition will improve Outcome: Progressing   Problem: Nutrition: Goal: Adequate nutrition will be maintained Outcome: Progressing   Problem: Elimination: Goal: Will not experience complications related to urinary retention Outcome: Progressing

## 2023-10-31 NOTE — Plan of Care (Signed)
  Problem: Education: Goal: Knowledge of General Education information will improve Description: Including pain rating scale, medication(s)/side effects and non-pharmacologic comfort measures Outcome: Not Progressing   Problem: Clinical Measurements: Goal: Respiratory complications will improve Outcome: Progressing   Problem: Pain Managment: Goal: General experience of comfort will improve and/or be controlled Outcome: Progressing   Problem: Safety: Goal: Ability to remain free from injury will improve Outcome: Progressing   Problem: Skin Integrity: Goal: Risk for impaired skin integrity will decrease Outcome: Not Progressing

## 2023-10-31 NOTE — Progress Notes (Signed)
 NAME:  [name], MRN:  [id number], DOB:  Apr 23, 1965, LOS: 1 ADMISSION DATE:  10/29/2023, CONSULTATION DATE:  10/30/2023 REFERRING MD: Nettie Earing, MD, CHIEF COMPLAINT:  AMS for 2 days   History of Present Illness:  A 58 yr old male patient with OSA/OHS, morbid obesity, D-CHF, and GERD who had UTI and sepsis before leading to AMS per his wife. He has AMS for 2 days, he was found by EMS laying in his own urine and feces from 2 days. He has home aid that came yesterday but she wouldn't let her clean him up. He apparently fell out of his powerchair. Currently, he is altered on BiPAP and he cannot provide hx.   Pertinent  Medical History  OSA/OHS, morbid obesity, D-CHF, GERD, UTI sepsis   Significant Hospital Events: Including procedures, antibiotic start and stop dates in addition to other pertinent events   7/18: admit to ICU. Given 1.5 L LR, Vanco, Flagyl , and Cefepime  in ED.    Interim History / Subjective:   No overnight events Refused to wear BiPAP last night On oxygen  supplementation at 6 L  Objective    Blood pressure (!) 155/76, pulse 93, temperature (!) 97.4 F (36.3 C), temperature source Oral, resp. rate 16, height 5' 11 (1.803 m), weight (!) 170.2 kg, SpO2 97%.    FiO2 (%):  [44 %] 44 %   Intake/Output Summary (Last 24 hours) at 10/31/2023 1038 Last data filed at 10/31/2023 1016 Gross per 24 hour  Intake 1152.33 ml  Output 2900 ml  Net -1747.67 ml   Filed Weights   10/29/23 2317 10/30/23 0547 10/31/23 0500  Weight: (!) 155 kg (!) 169.4 kg (!) 170.2 kg    Examination: General: Middle-age, does not appear to be in distress HENT: Moist oral mucosa Lungs: Fair air entry, decreased at the bases Cardiovascular: S1-S2 appreciated Abdomen: Bowel sounds appreciated Extremities: No edema, no clubbing Neuro: Difficult to arouse but was alert and interactive earlier-appears to wax and wane  I reviewed last 24 h vitals and pain scores, last 48 h intake and output,  last 24 h labs and trends, and last 24 h imaging results.  Resolved problem list   Assessment and Plan   Metabolic encephalopathy secondary to UTI/sepsis - Continue to replete electrolytes - Hypercarbia is resolving  Hyperammonemia - On lactulose   UTI/sepsis - Tmax of 100.2 - On ceftriaxone -day 3 - MRSA PCR negative  Chronic hypercapnic respiratory failure Obstructive sleep apnea/obesity hypoventilation syndrome - Patient refusing BiPAP  History of heart failure - Echocardiogram is normal on 10/30/2023  S/p fall - CT head negative  Morbid obesity  Best Practice (right click and Reselect all SmartList Selections daily)   Diet/type: Regular consistency (see orders) DVT prophylaxis LMWH Pressure ulcer(s): N/A GI prophylaxis: PPI Lines: N/A Foley:  N/A Code Status:  full code Last date of multidisciplinary goals of care discussion []   Labs   CBC: Recent Labs  Lab 10/30/23 0000 10/31/23 0310  WBC 10.1 8.0  NEUTROABS 8.6*  --   HGB 12.3* 11.1*  HCT 43.8 39.7  MCV 100.2* 100.3*  PLT 295 258    Basic Metabolic Panel: Recent Labs  Lab 10/30/23 0100 10/30/23 0614 10/31/23 0310  NA 140  --  139  K 4.2  --  4.5  CL 87*  --  92*  CO2 42*  --  39*  GLUCOSE 131*  --  152*  BUN 17  --  14  CREATININE 0.53*  --  0.68  CALCIUM  8.9  --  9.0  MG  --  2.2 2.2  PHOS  --   --  3.0   GFR: Estimated Creatinine Clearance: 165.2 mL/min (by C-G formula based on SCr of 0.68 mg/dL). Recent Labs  Lab 10/30/23 0000 10/30/23 0112 10/31/23 0310  WBC 10.1  --  8.0  LATICACIDVEN  --  1.0  --     Liver Function Tests: Recent Labs  Lab 10/30/23 0100  AST 18  ALT 19  ALKPHOS 71  BILITOT 0.6  PROT 7.7  ALBUMIN 3.0*   No results for input(s): LIPASE, AMYLASE in the last 168 hours. Recent Labs  Lab 10/30/23 0614  AMMONIA 49*    ABG    Component Value Date/Time   PHART 7.62 (HH) 10/30/2023 0828   PCO2ART 50 (H) 10/30/2023 0828   PO2ART 149 (H)  10/30/2023 0828   HCO3 50.5 (H) 10/30/2023 0828   TCO2 30 03/31/2022 1649   O2SAT 100 10/30/2023 0828     Coagulation Profile: Recent Labs  Lab 10/30/23 0243  INR 1.0    Cardiac Enzymes: Recent Labs  Lab 10/30/23 0614  CKTOTAL 12*    HbA1C: Hgb A1c MFr Bld  Date/Time Value Ref Range Status  10/30/2023 06:14 AM 5.2 4.8 - 5.6 % Final    Comment:    (NOTE) Diagnosis of Diabetes The following HbA1c ranges recommended by the American Diabetes Association (ADA) may be used as an aid in the diagnosis of diabetes mellitus.  Hemoglobin             Suggested A1C NGSP%              Diagnosis  <5.7                   Non Diabetic  5.7-6.4                Pre-Diabetic  >6.4                   Diabetic  <7.0                   Glycemic control for                       adults with diabetes.    06/30/2022 11:41 AM 5.5 4.8 - 5.6 % Final    Comment:    (NOTE)         Prediabetes: 5.7 - 6.4         Diabetes: >6.4         Glycemic control for adults with diabetes: <7.0     CBG: Recent Labs  Lab 10/30/23 1548 10/30/23 1931 10/30/23 2326 10/31/23 0330 10/31/23 0744  GLUCAP 111* 164* 154* 130* 154*    Review of Systems:   AMS  Past Medical History:  He,  has a past medical history of Arthritis, Asthma, Back pain, CHF (congestive heart failure) (HCC), COPD (chronic obstructive pulmonary disease) (HCC), GERD (gastroesophageal reflux disease), Headache, Hernia, History of hiatal hernia, History of kidney stones, Hypogonadism in male, Neuromuscular disorder (HCC), On home oxygen  therapy, PONV (postoperative nausea and vomiting), Pre-diabetes, Shortness of breath dyspnea, Sleep apnea, Ulcer, and Wears glasses.   Surgical History:   Past Surgical History:  Procedure Laterality Date   BACK SURGERY     CYSTOSCOPY W/ URETERAL STENT PLACEMENT Left 03/31/2022   Procedure: CYSTOSCOPY WITH RETROGRADE PYELOGRAM/URETERAL STENT PLACEMENT;  Surgeon: Devere Lonni Righter, MD;  Location: [facility];  Service: Urology;  Laterality: Left;   CYSTOSCOPY/URETEROSCOPY/HOLMIUM LASER/STENT PLACEMENT Bilateral 07/02/2022   Procedure: CYSTOSCOPY, LEFT STENT REMOVAL, BILATERAL URETEROSCOPY, HOLMIUM LASER, AND RIGHT STENT PLACEMENT;  Surgeon: Devere Lonni Righter, MD;  Location: [facility];  Service: Urology;  Laterality: Bilateral;  90 MINUTES   JOINT REPLACEMENT     Left knee replacement   LAPAROSCOPIC GASTRIC SLEEVE RESECTION N/A 01/08/2015   Procedure: LAPAROSCOPIC GASTRIC SLEEVE RESECTION;  Surgeon: Donnice Lunger, MD;  Location: [facility];  Service: General;  Laterality: N/A;   morphine  pump     RIGHT HEART CATH N/A 09/22/2019   Procedure: RIGHT HEART CATH;  Surgeon: Cherrie Toribio SAUNDERS, MD;  Location: [facility];  Service: Cardiovascular;  Laterality: N/A;   SHOULDER ARTHROSCOPY WITH SUBACROMIAL DECOMPRESSION AND BICEP TENDON REPAIR Left 10/06/2013   Procedure: LEFT SHOULDER ARTHROSCOPY DEBRIDEMENT EXTENTSIVE,DISTAL CLAVICULECTOMY,DECOMPRESSION SUBACROMIAL PARTIAL ACROMIOPLASTY WITH ROTATOR CUFF REPAIR, and excision of CALCIUM  DEPOSIT;  Surgeon: Toribio JULIANNA Chancy, MD;  Location: [facility];  Service: Orthopedics;  Laterality: Left;   SHOULDER ARTHROSCOPY WITH SUBACROMIAL DECOMPRESSION, ROTATOR CUFF REPAIR AND BICEP TENDON REPAIR Right 08/18/2013   Procedure: RIGHT SHOULDER ARTHROSCOPY WITH SUBACROMIAL DECOMPRESSION/PARTIAL ACROMIOPLASTY WITH CORACOACROMIAL RELEASE/DISTAL CLAVICULECTOMY/ ROTATOR CUFF REPAIR/DEBRIDEMENT EXTENTSIVE;  Surgeon: Toribio JULIANNA Chancy, MD;  Location: [facility];  Service: Orthopedics;  Laterality: Right;  ANESTHESIA: GENERAL, PRE/POST OP SCALENE   TONSILLECTOMY       Social History:   reports that he has never smoked. His smokeless tobacco use includes snuff and chew. He reports current alcohol use. He reports that he does not use drugs.   Family History:  His family history includes Diabetes in his mother; Pneumonia in his  father.   Allergies Allergies  Allergen Reactions   Tape Other (See Comments)    Blisters - tolerates paper tape well    The patient is critically ill with multiple organ systems failure and requires high complexity decision making for assessment and support, frequent evaluation and titration of therapies, application of advanced monitoring technologies and extensive interpretation of multiple databases. Critical Care Time devoted to patient care services described in this note independent of APP/resident time (if applicable)  is 33 minutes.   Jennet Epley MD [facility] Personal pager: See Amion If unanswered, please page CCM On-call: #[phone]

## 2023-10-31 NOTE — Progress Notes (Signed)
 I spoke with pt about putting on the BIPAP tonight for sleep and the pt stated several times that he did not want to wear the machine.  He also stated that his doctor is aware of him not being compliant with using CPAP at night for his OSA. Pt is resting comfortably on 6LHFNC with SpO2 of 96%. Will continue to monitor and treat.

## 2023-11-01 DIAGNOSIS — G9341 Metabolic encephalopathy: Secondary | ICD-10-CM | POA: Diagnosis not present

## 2023-11-01 DIAGNOSIS — J9612 Chronic respiratory failure with hypercapnia: Secondary | ICD-10-CM | POA: Diagnosis not present

## 2023-11-01 DIAGNOSIS — N390 Urinary tract infection, site not specified: Secondary | ICD-10-CM | POA: Diagnosis not present

## 2023-11-01 DIAGNOSIS — E7220 Disorder of urea cycle metabolism, unspecified: Secondary | ICD-10-CM

## 2023-11-01 DIAGNOSIS — A419 Sepsis, unspecified organism: Secondary | ICD-10-CM | POA: Diagnosis not present

## 2023-11-01 LAB — BASIC METABOLIC PANEL WITH GFR
Anion gap: 10 (ref 5–15)
BUN: 14 mg/dL (ref 6–20)
CO2: 36 mmol/L — ABNORMAL HIGH (ref 22–32)
Calcium: 8.4 mg/dL — ABNORMAL LOW (ref 8.9–10.3)
Chloride: 90 mmol/L — ABNORMAL LOW (ref 98–111)
Creatinine, Ser: 0.63 mg/dL (ref 0.61–1.24)
GFR, Estimated: >60 mL/min (ref >60)
Glucose, Bld: 115 mg/dL — ABNORMAL HIGH (ref 70–99)
Potassium: 4 mmol/L (ref 3.5–5.1)
Sodium: 136 mmol/L (ref 135–145)

## 2023-11-01 LAB — GLUCOSE, CAPILLARY
Glucose-Capillary: 100 mg/dL — ABNORMAL HIGH (ref 70–99)
Glucose-Capillary: 101 mg/dL — ABNORMAL HIGH (ref 70–99)
Glucose-Capillary: 110 mg/dL — ABNORMAL HIGH (ref 70–99)
Glucose-Capillary: 112 mg/dL — ABNORMAL HIGH (ref 70–99)
Glucose-Capillary: 121 mg/dL — ABNORMAL HIGH (ref 70–99)
Glucose-Capillary: 124 mg/dL — ABNORMAL HIGH (ref 70–99)

## 2023-11-01 LAB — CBC
HCT: 37.2 % — ABNORMAL LOW (ref 39.0–52.0)
Hemoglobin: 10.3 g/dL — ABNORMAL LOW (ref 13.0–17.0)
MCH: 27.3 pg (ref 26.0–34.0)
MCHC: 27.7 g/dL — ABNORMAL LOW (ref 30.0–36.0)
MCV: 98.7 fL (ref 80.0–100.0)
Platelets: 258 K/uL (ref 150–400)
RBC: 3.77 MIL/uL — ABNORMAL LOW (ref 4.22–5.81)
RDW: 13.2 % (ref 11.5–15.5)
WBC: 7.2 K/uL (ref 4.0–10.5)
nRBC: 0 % (ref 0.0–0.2)

## 2023-11-01 LAB — MAGNESIUM: Magnesium: 2 mg/dL (ref 1.7–2.4)

## 2023-11-01 LAB — BLOOD GAS, ARTERIAL
Acid-Base Excess: 17 mmol/L — ABNORMAL HIGH (ref 0.0–2.0)
Bicarbonate: 45.7 mmol/L — ABNORMAL HIGH (ref 20.0–28.0)
Delivery systems: POSITIVE
Drawn by: 11249
Expiratory PAP: 8 cmH2O
FIO2: 40 %
Inspiratory PAP: 22 cmH2O
Mode: POSITIVE
O2 Saturation: 99.9 %
Patient temperature: 37.2
pCO2 arterial: 80 mmHg (ref 32–48)
pH, Arterial: 7.37 (ref 7.35–7.45)
pO2, Arterial: 104 mmHg (ref 83–108)

## 2023-11-01 LAB — PHOSPHORUS: Phosphorus: 2.7 mg/dL (ref 2.5–4.6)

## 2023-11-01 MED ORDER — PANTOPRAZOLE SODIUM 40 MG PO TBEC: 40.0000 mg | DELAYED_RELEASE_TABLET | Freq: Every day | ORAL | Status: DC

## 2023-11-01 MED ORDER — ONDANSETRON HCL 4 MG/2ML IJ SOLN
4.0000 mg | Freq: Four times a day (QID) | INTRAMUSCULAR | Status: DC | PRN
  Administered 2023-11-02: 4 mg via INTRAVENOUS
  Filled 2023-11-01: qty 2

## 2023-11-01 MED ORDER — PROMETHAZINE HCL 25 MG PO TABS
25.0000 mg | ORAL_TABLET | Freq: Every day | ORAL | Status: DC | PRN
  Administered 2023-11-01: 25 mg via ORAL
  Filled 2023-11-01 (×2): qty 1

## 2023-11-01 NOTE — Plan of Care (Signed)
  Problem: Fluid Volume: Goal: Ability to maintain a balanced intake and output will improve Outcome: Progressing   Problem: Metabolic: Goal: Ability to maintain appropriate glucose levels will improve Outcome: Progressing   Problem: Nutritional: Goal: Maintenance of adequate nutrition will improve Outcome: Progressing   Problem: Clinical Measurements: Goal: Diagnostic test results will improve Outcome: Progressing   Problem: Nutrition: Goal: Adequate nutrition will be maintained Outcome: Progressing   Problem: Elimination: Goal: Will not experience complications related to urinary retention Outcome: Progressing

## 2023-11-01 NOTE — Progress Notes (Signed)
   11/01/23 2259  BiPAP/CPAP/SIPAP  BiPAP/CPAP/SIPAP Pt Type Adult  BiPAP/CPAP/SIPAP SERVO (air)  Mask Type Full face mask  Dentures removed? Not applicable  Mask Size Medium  Set Rate 18 breaths/min  Respiratory Rate 30 breaths/min  IPAP 22 cmH20  EPAP 8 cmH2O  PEEP 8 cmH20  FiO2 (%) 40 %  Flow Rate 0.9 lpm  Minute Ventilation 9.9  Leak 33  Peak Inspiratory Pressure (PIP) 20  Tidal Volume (Vt) 440  Patient Home Machine No  Patient Home Mask No  Patient Home Tubing No  Auto Titrate No  Press High Alarm 30 cmH2O  CPAP/SIPAP surface wiped down Yes  Device Plugged into RED Power Outlet Yes  Oxygen  Percent 40 %  BiPAP/CPAP /SiPAP Vitals  Pulse Rate 74  Resp 17  SpO2 98 %  MEWS Score/Color  MEWS Score 0  MEWS Score Color Green

## 2023-11-01 NOTE — Progress Notes (Signed)
   11/01/23 0346  BiPAP/CPAP/SIPAP  $ Non-Invasive Ventilator  Non-Invasive Vent Subsequent  BiPAP/CPAP/SIPAP Pt Type Adult  BiPAP/CPAP/SIPAP SERVO  Mask Type Full face mask  Dentures removed? Not applicable  Mask Size Medium  Set Rate 16 breaths/min  Respiratory Rate 28 breaths/min  IPAP 22 cmH20  EPAP 8 cmH2O  PEEP 8 cmH20  FiO2 (%) 40 %  Flow Rate 0.91 lpm  Minute Ventilation 8.2  Leak 46  Peak Inspiratory Pressure (PIP) 21  Tidal Volume (Vt) 640  Patient Home Machine No  Patient Home Mask No  Patient Home Tubing No  Auto Titrate No  Press High Alarm 30 cmH2O  Nasal massage performed No (comment)  Device Plugged into RED Power Outlet Yes  BiPAP/CPAP /SiPAP Vitals  Pulse Rate 74  Resp 16  SpO2 96 %  MEWS Score/Color  MEWS Score 1  MEWS Score Color Green

## 2023-11-01 NOTE — Progress Notes (Signed)
 eLink Physician-Brief Progress Note Patient Name: [name] DOB: Aug 23, 1965 MRN: [id number]   Date of Service  11/01/2023  HPI/Events of Note  Nausea.  RN requesting something as needed.  eICU Interventions  Zofran  ordered.     Intervention Category Minor Interventions: Routine modifications to care plan (e.g. PRN medications for pain, fever)  Jerilynn Berg 11/01/2023, 9:25 PM

## 2023-11-01 NOTE — Plan of Care (Signed)
   Problem: Education: Goal: Ability to describe self-care measures that may prevent or decrease complications (Diabetes Survival Skills Education) will improve Outcome: Progressing Goal: Individualized Educational Video(s) Outcome: Progressing   Problem: Coping: Goal: Ability to adjust to condition or change in health will improve Outcome: Progressing   Problem: Fluid Volume: Goal: Ability to maintain a balanced intake and output will improve Outcome: Progressing   Problem: Health Behavior/Discharge Planning: Goal: Ability to identify and utilize available resources and services will improve Outcome: Progressing Goal: Ability to manage health-related needs will improve Outcome: Progressing   Problem: Metabolic: Goal: Ability to maintain appropriate glucose levels will improve Outcome: Progressing   Problem: Nutritional: Goal: Maintenance of adequate nutrition will improve Outcome: Progressing Goal: Progress toward achieving an optimal weight will improve Outcome: Progressing   Problem: Skin Integrity: Goal: Risk for impaired skin integrity will decrease Outcome: Progressing   Problem: Tissue Perfusion: Goal: Adequacy of tissue perfusion will improve Outcome: Progressing   Problem: Education: Goal: Knowledge of General Education information will improve Description: Including pain rating scale, medication(s)/side effects and non-pharmacologic comfort measures Outcome: Progressing   Problem: Clinical Measurements: Goal: Ability to maintain clinical measurements within normal limits will improve Outcome: Progressing Goal: Will remain free from infection Outcome: Progressing Goal: Diagnostic test results will improve Outcome: Progressing Goal: Respiratory complications will improve Outcome: Progressing Goal: Cardiovascular complication will be avoided Outcome: Progressing

## 2023-11-01 NOTE — Progress Notes (Signed)
 eLink Physician-Brief Progress Note Patient Name: [name] DOB: Jan 19, 1966 MRN: [id number]   Date of Service  11/01/2023  HPI/Events of Note  Critical lab value: pCO2 80  Request for phenergan . Takes at home.  Patient seen awake and conversant BMI 52 pH 7.37  eICU Interventions  Chronic compensated hypercapnia secondary to OSA/OHS refusing BiPap Home phenergan  ordered as needed for nausea     Intervention Category Intermediate Interventions: Diagnostic test evaluation Minor Interventions: Routine modifications to care plan (e.g. PRN medications for pain, fever)  Damien DASEN Karah Caruthers 11/01/2023, 4:42 AM

## 2023-11-01 NOTE — Progress Notes (Signed)
 NAME:  [name], MRN:  [id number], DOB:  14-Aug-1965, LOS: 2 ADMISSION DATE:  10/29/2023, CONSULTATION DATE:  10/30/2023 REFERRING MD: Nettie Earing, MD, CHIEF COMPLAINT:  AMS for 2 days   History of Present Illness:  A 58 yr old male patient with OSA/OHS, morbid obesity, D-CHF, and GERD who had UTI and sepsis before leading to AMS per his wife. He has AMS for 2 days, he was found by EMS laying in his own urine and feces from 2 days. He has home aid that came yesterday but she wouldn't let her clean him up. He apparently fell out of his powerchair. Currently, he is altered on BiPAP and he cannot provide hx.   Pertinent  Medical History  OSA/OHS, morbid obesity, D-CHF, GERD, UTI sepsis   Significant Hospital Events: Including procedures, antibiotic start and stop dates in addition to other pertinent events   7/18: admit to ICU. Given 1.5 L LR, Vanco, Flagyl , and Cefepime  in ED.   7/20  Interim History / Subjective:   No overnight events Did wear his BiPAP last night Awake alert interactive this morning Did discuss the need to wear BiPAP regularly On 6 L of oxygen  this morning  Objective    Blood pressure 108/78, pulse 82, temperature 98.2 F (36.8 C), temperature source Oral, resp. rate (!) 24, height 5' 11 (1.803 m), weight (!) 169.1 kg, SpO2 97%.    FiO2 (%):  [40 %-44 %] 44 % PEEP:  [8 cmH20] 8 cmH20   Intake/Output Summary (Last 24 hours) at 11/01/2023 0854 Last data filed at 11/01/2023 0359 Gross per 24 hour  Intake 580 ml  Output 1550 ml  Net -970 ml   Filed Weights   10/30/23 0547 10/31/23 0500 11/01/23 0350  Weight: (!) 169.4 kg (!) 170.2 kg (!) 169.1 kg    Examination: General: Middle-aged, does not appear to be in distress HENT: Moist oral mucosa Lungs: Fair air entry, decreased at the bases Cardiovascular: S1-S2 appreciated Abdomen: Bowel sounds appreciated Extremities: No edema, no clubbing Neuro: Awake alert interactive today  I reviewed last 24 h  vitals and pain scores, last 48 h intake and output, last 24 h labs and trends, and last 24 h imaging results.  Resolved problem list   Assessment and Plan   Acute metabolic encephalopathy secondary to UTI/sepsis - Still hypercarbic with a PCO2 of 80 - Likely chronic retainer - Continue to replete electrolytes - Day 4 of antibiotics-on ceftriaxone -stop ceftriaxone  at day 7  Hyperammonemia - On lactulose  -Repeat ammonia in a.m.  UTI/sepsis Day 4/7 of ceftriaxone  - MRSA PCR negative -No organism identified, clinically stable  Chronic hypercapnic respiratory failure Obstructive sleep apnea/obesity hypoventilation syndrome - Discussed need to use BiPAP on a regular basis - Was titrated CPAP of 8 in September 2021  Echocardiogram 10/30/2023 is within normal limits although with poor windows  Status post fall CT head negative  Morbid obesity  Chronic pain syndrome, intrathecal pump in place - Continuous infusion mode fentanyl  -dose needs confirmed   Best Practice (right click and Reselect all SmartList Selections daily)   Diet/type: Regular consistency (see orders) DVT prophylaxis LMWH Pressure ulcer(s): N/A GI prophylaxis: PPI Lines: N/A Foley:  N/A Code Status:  full code Last date of multidisciplinary goals of care discussion []   Labs   CBC: Recent Labs  Lab 10/30/23 0000 10/31/23 0310 11/01/23 0257  WBC 10.1 8.0 7.2  NEUTROABS 8.6*  --   --   HGB 12.3* 11.1* 10.3*  HCT  43.8 39.7 37.2*  MCV 100.2* 100.3* 98.7  PLT 295 258 258    Basic Metabolic Panel: Recent Labs  Lab 10/30/23 0100 10/30/23 0614 10/31/23 0310 11/01/23 0257  NA 140  --  139 136  K 4.2  --  4.5 4.0  CL 87*  --  92* 90*  CO2 42*  --  39* 36*  GLUCOSE 131*  --  152* 115*  BUN 17  --  14 14  CREATININE 0.53*  --  0.68 0.63  CALCIUM  8.9  --  9.0 8.4*  MG  --  2.2 2.2 2.0  PHOS  --   --  3.0 2.7   GFR: Estimated Creatinine Clearance: 164.5 mL/min (by C-G formula based on SCr of  0.63 mg/dL). Recent Labs  Lab 10/30/23 0000 10/30/23 0112 10/31/23 0310 11/01/23 0257  WBC 10.1  --  8.0 7.2  LATICACIDVEN  --  1.0  --   --     Liver Function Tests: Recent Labs  Lab 10/30/23 0100  AST 18  ALT 19  ALKPHOS 71  BILITOT 0.6  PROT 7.7  ALBUMIN 3.0*   No results for input(s): LIPASE, AMYLASE in the last 168 hours. Recent Labs  Lab 10/30/23 0614  AMMONIA 49*    ABG    Component Value Date/Time   PHART 7.37 11/01/2023 0400   PCO2ART 80 (HH) 11/01/2023 0400   PO2ART 104 11/01/2023 0400   HCO3 45.7 (H) 11/01/2023 0400   TCO2 30 03/31/2022 1649   O2SAT 99.9 11/01/2023 0400     Coagulation Profile: Recent Labs  Lab 10/30/23 0243  INR 1.0    Cardiac Enzymes: Recent Labs  Lab 10/30/23 0614  CKTOTAL 12*    HbA1C: Hgb A1c MFr Bld  Date/Time Value Ref Range Status  10/30/2023 06:14 AM 5.2 4.8 - 5.6 % Final    Comment:    (NOTE) Diagnosis of Diabetes The following HbA1c ranges recommended by the American Diabetes Association (ADA) may be used as an aid in the diagnosis of diabetes mellitus.  Hemoglobin             Suggested A1C NGSP%              Diagnosis  <5.7                   Non Diabetic  5.7-6.4                Pre-Diabetic  >6.4                   Diabetic  <7.0                   Glycemic control for                       adults with diabetes.    06/30/2022 11:41 AM 5.5 4.8 - 5.6 % Final    Comment:    (NOTE)         Prediabetes: 5.7 - 6.4         Diabetes: >6.4         Glycemic control for adults with diabetes: <7.0     CBG: Recent Labs  Lab 10/31/23 1622 10/31/23 2037 10/31/23 2344 11/01/23 0347 11/01/23 0804  GLUCAP 112* 140* 116* 112* 101*    Review of Systems:   AMS  Past Medical History:  He,  has a past medical history of Arthritis, Asthma, Back pain, CHF (  congestive heart failure) (HCC), COPD (chronic obstructive pulmonary disease) (HCC), GERD (gastroesophageal reflux disease), Headache, Hernia,  History of hiatal hernia, History of kidney stones, Hypogonadism in male, Neuromuscular disorder (HCC), On home oxygen  therapy, PONV (postoperative nausea and vomiting), Pre-diabetes, Shortness of breath dyspnea, Sleep apnea, Ulcer, and Wears glasses.   Surgical History:   Past Surgical History:  Procedure Laterality Date   BACK SURGERY     CYSTOSCOPY W/ URETERAL STENT PLACEMENT Left 03/31/2022   Procedure: CYSTOSCOPY WITH RETROGRADE PYELOGRAM/URETERAL STENT PLACEMENT;  Surgeon: Devere Lonni Righter, MD;  Location: [facility];  Service: Urology;  Laterality: Left;   CYSTOSCOPY/URETEROSCOPY/HOLMIUM LASER/STENT PLACEMENT Bilateral 07/02/2022   Procedure: CYSTOSCOPY, LEFT STENT REMOVAL, BILATERAL URETEROSCOPY, HOLMIUM LASER, AND RIGHT STENT PLACEMENT;  Surgeon: Devere Lonni Righter, MD;  Location: [facility];  Service: Urology;  Laterality: Bilateral;  90 MINUTES   JOINT REPLACEMENT     Left knee replacement   LAPAROSCOPIC GASTRIC SLEEVE RESECTION N/A 01/08/2015   Procedure: LAPAROSCOPIC GASTRIC SLEEVE RESECTION;  Surgeon: Donnice Lunger, MD;  Location: [facility];  Service: General;  Laterality: N/A;   morphine  pump     RIGHT HEART CATH N/A 09/22/2019   Procedure: RIGHT HEART CATH;  Surgeon: Cherrie Toribio SAUNDERS, MD;  Location: [facility];  Service: Cardiovascular;  Laterality: N/A;   SHOULDER ARTHROSCOPY WITH SUBACROMIAL DECOMPRESSION AND BICEP TENDON REPAIR Left 10/06/2013   Procedure: LEFT SHOULDER ARTHROSCOPY DEBRIDEMENT EXTENTSIVE,DISTAL CLAVICULECTOMY,DECOMPRESSION SUBACROMIAL PARTIAL ACROMIOPLASTY WITH ROTATOR CUFF REPAIR, and excision of CALCIUM  DEPOSIT;  Surgeon: Toribio JULIANNA Chancy, MD;  Location: [facility];  Service: Orthopedics;  Laterality: Left;   SHOULDER ARTHROSCOPY WITH SUBACROMIAL DECOMPRESSION, ROTATOR CUFF REPAIR AND BICEP TENDON REPAIR Right 08/18/2013   Procedure: RIGHT SHOULDER ARTHROSCOPY WITH SUBACROMIAL DECOMPRESSION/PARTIAL ACROMIOPLASTY WITH CORACOACROMIAL  RELEASE/DISTAL CLAVICULECTOMY/ ROTATOR CUFF REPAIR/DEBRIDEMENT EXTENTSIVE;  Surgeon: Toribio JULIANNA Chancy, MD;  Location: [facility];  Service: Orthopedics;  Laterality: Right;  ANESTHESIA: GENERAL, PRE/POST OP SCALENE   TONSILLECTOMY       Social History:   reports that he has never smoked. His smokeless tobacco use includes snuff and chew. He reports current alcohol use. He reports that he does not use drugs.   Family History:  His family history includes Diabetes in his mother; Pneumonia in his father.   Allergies Allergies  Allergen Reactions   Tape Other (See Comments)    Blisters - tolerates paper tape well    The patient is critically ill with multiple organ systems failure and requires high complexity decision making for assessment and support, frequent evaluation and titration of therapies, application of advanced monitoring technologies and extensive interpretation of multiple databases. Critical Care Time devoted to patient care services described in this note independent of APP/resident time (if applicable)  is 32 minutes.   Jennet Epley MD [facility] Personal pager: See Amion If unanswered, please page CCM On-call: #[phone]

## 2023-11-02 ENCOUNTER — Inpatient Hospital Stay (HOSPITAL_COMMUNITY)

## 2023-11-02 DIAGNOSIS — B9620 Unspecified Escherichia coli [E. coli] as the cause of diseases classified elsewhere: Secondary | ICD-10-CM | POA: Diagnosis not present

## 2023-11-02 DIAGNOSIS — J9612 Chronic respiratory failure with hypercapnia: Secondary | ICD-10-CM | POA: Diagnosis not present

## 2023-11-02 DIAGNOSIS — Z4682 Encounter for fitting and adjustment of non-vascular catheter: Secondary | ICD-10-CM | POA: Diagnosis not present

## 2023-11-02 DIAGNOSIS — N390 Urinary tract infection, site not specified: Secondary | ICD-10-CM | POA: Diagnosis not present

## 2023-11-02 DIAGNOSIS — G9341 Metabolic encephalopathy: Secondary | ICD-10-CM | POA: Diagnosis not present

## 2023-11-02 DIAGNOSIS — J9622 Acute and chronic respiratory failure with hypercapnia: Secondary | ICD-10-CM

## 2023-11-02 DIAGNOSIS — E662 Morbid (severe) obesity with alveolar hypoventilation: Secondary | ICD-10-CM

## 2023-11-02 DIAGNOSIS — R739 Hyperglycemia, unspecified: Secondary | ICD-10-CM

## 2023-11-02 LAB — BLOOD GAS, ARTERIAL
Acid-Base Excess: 15.3 mmol/L — ABNORMAL HIGH (ref 0.0–2.0)
Bicarbonate: 50 mmol/L — ABNORMAL HIGH (ref 20.0–28.0)
Delivery systems: POSITIVE
Expiratory PAP: 8 cmH2O
FIO2: 70 %
Inspiratory PAP: 23 cmH2O
Mode: POSITIVE
O2 Saturation: 98.6 %
Patient temperature: 36.1
pCO2 arterial: >123 mmHg (ref 32–48)
pH, Arterial: 7.18 — CL (ref 7.35–7.45)
pO2, Arterial: 94 mmHg (ref 83–108)

## 2023-11-02 LAB — CBC
HCT: 40.2 % (ref 39.0–52.0)
Hemoglobin: 11.3 g/dL — ABNORMAL LOW (ref 13.0–17.0)
MCH: 28.1 pg (ref 26.0–34.0)
MCHC: 28.1 g/dL — ABNORMAL LOW (ref 30.0–36.0)
MCV: 100 fL (ref 80.0–100.0)
Platelets: 260 K/uL (ref 150–400)
RBC: 4.02 MIL/uL — ABNORMAL LOW (ref 4.22–5.81)
RDW: 13.2 % (ref 11.5–15.5)
WBC: 8.7 K/uL (ref 4.0–10.5)
nRBC: 0 % (ref 0.0–0.2)

## 2023-11-02 LAB — BASIC METABOLIC PANEL WITH GFR
Anion gap: 7 (ref 5–15)
Anion gap: 15 (ref 5–15)
BUN: 12 mg/dL (ref 6–20)
BUN: 13 mg/dL (ref 6–20)
CO2: 39 mmol/L — ABNORMAL HIGH (ref 22–32)
CO2: 33 mmol/L — ABNORMAL HIGH (ref 22–32)
Calcium: 8.9 mg/dL (ref 8.9–10.3)
Calcium: 8.8 mg/dL — ABNORMAL LOW (ref 8.9–10.3)
Chloride: 92 mmol/L — ABNORMAL LOW (ref 98–111)
Chloride: 90 mmol/L — ABNORMAL LOW (ref 98–111)
Creatinine, Ser: 0.44 mg/dL — ABNORMAL LOW (ref 0.61–1.24)
Creatinine, Ser: 0.47 mg/dL — ABNORMAL LOW (ref 0.61–1.24)
GFR, Estimated: >60 mL/min (ref >60)
GFR, Estimated: >60 mL/min (ref >60)
Glucose, Bld: 142 mg/dL — ABNORMAL HIGH (ref 70–99)
Glucose, Bld: 109 mg/dL — ABNORMAL HIGH (ref 70–99)
Potassium: 4.3 mmol/L (ref 3.5–5.1)
Potassium: 3.5 mmol/L (ref 3.5–5.1)
Sodium: 138 mmol/L (ref 135–145)
Sodium: 138 mmol/L (ref 135–145)

## 2023-11-02 LAB — URINE CULTURE: Culture: >=100000 — AB

## 2023-11-02 LAB — POCT I-STAT 7, (LYTES, BLD GAS, ICA,H+H)
Acid-Base Excess: 15 mmol/L — ABNORMAL HIGH (ref 0.0–2.0)
Bicarbonate: 42.4 mmol/L — ABNORMAL HIGH (ref 20.0–28.0)
Calcium, Ion: 1.15 mmol/L (ref 1.15–1.40)
HCT: 39 % (ref 39.0–52.0)
Hemoglobin: 13.3 g/dL (ref 13.0–17.0)
O2 Saturation: 99 %
Patient temperature: 97.5
Potassium: 3.7 mmol/L (ref 3.5–5.1)
Sodium: 139 mmol/L (ref 135–145)
TCO2: 44 mmol/L — ABNORMAL HIGH (ref 22–32)
pCO2 arterial: 58.9 mmHg — ABNORMAL HIGH (ref 32–48)
pH, Arterial: 7.463 — ABNORMAL HIGH (ref 7.35–7.45)
pO2, Arterial: 131 mmHg — ABNORMAL HIGH (ref 83–108)

## 2023-11-02 LAB — GLUCOSE, CAPILLARY
Glucose-Capillary: 150 mg/dL — ABNORMAL HIGH (ref 70–99)
Glucose-Capillary: 122 mg/dL — ABNORMAL HIGH (ref 70–99)
Glucose-Capillary: 136 mg/dL — ABNORMAL HIGH (ref 70–99)
Glucose-Capillary: 112 mg/dL — ABNORMAL HIGH (ref 70–99)
Glucose-Capillary: 103 mg/dL — ABNORMAL HIGH (ref 70–99)

## 2023-11-02 LAB — PROCALCITONIN: Procalcitonin: <0.1 ng/mL

## 2023-11-02 LAB — AMMONIA: Ammonia: 51 umol/L — ABNORMAL HIGH (ref 9–35)

## 2023-11-02 LAB — MAGNESIUM
Magnesium: 2.2 mg/dL (ref 1.7–2.4)
Magnesium: 2 mg/dL (ref 1.7–2.4)

## 2023-11-02 LAB — PHOSPHORUS
Phosphorus: 3.4 mg/dL (ref 2.5–4.6)
Phosphorus: <1 mg/dL — CL (ref 2.5–4.6)

## 2023-11-02 MED ORDER — EPINEPHRINE 1 MG/10ML IJ SOSY
PREFILLED_SYRINGE | INTRAMUSCULAR | Status: AC
  Administered 2023-11-02: 0.1 mg
  Filled 2023-11-02: qty 10

## 2023-11-02 MED ORDER — DEXMEDETOMIDINE HCL IN NACL 400 MCG/100ML IV SOLN
0.0000 ug/kg/h | INTRAVENOUS | Status: DC
  Administered 2023-11-02: 0.9 ug/kg/h via INTRAVENOUS
  Filled 2023-11-02: qty 100

## 2023-11-02 MED ORDER — SODIUM CHLORIDE 0.9 % IV SOLN: 250.0000 mL | INTRAVENOUS | Status: AC

## 2023-11-02 MED ORDER — KETAMINE HCL 10 MG/ML IJ SOLN
250.0000 mg | Freq: Once | INTRAMUSCULAR | Status: AC
  Administered 2023-11-02: 250 mg via INTRAVENOUS

## 2023-11-02 MED ORDER — FUROSEMIDE 10 MG/ML IJ SOLN
80.0000 mg | Freq: Two times a day (BID) | INTRAMUSCULAR | Status: DC
  Administered 2023-11-02 (×2): 80 mg via INTRAVENOUS
  Filled 2023-11-02 (×2): qty 8

## 2023-11-02 MED ORDER — KETAMINE HCL 50 MG/5ML IJ SOSY
PREFILLED_SYRINGE | INTRAMUSCULAR | Status: AC
  Filled 2023-11-02: qty 10

## 2023-11-02 MED ORDER — KETAMINE HCL 50 MG/5ML IJ SOSY
PREFILLED_SYRINGE | INTRAMUSCULAR | Status: AC
  Filled 2023-11-02: qty 15

## 2023-11-02 MED ORDER — ORAL CARE MOUTH RINSE
15.0000 mL | OROMUCOSAL | Status: DC
  Administered 2023-11-02: 15 mL via OROMUCOSAL

## 2023-11-02 MED ORDER — POTASSIUM PHOSPHATES 15 MMOLE/5ML IV SOLN
45.0000 mmol | Freq: Once | INTRAVENOUS | Status: AC
  Administered 2023-11-02: 45 mmol via INTRAVENOUS
  Filled 2023-11-02: qty 15

## 2023-11-02 MED ORDER — SODIUM CHLORIDE 0.9 % IV SOLN
3.0000 g | Freq: Four times a day (QID) | INTRAVENOUS | Status: AC
  Administered 2023-11-02 – 2023-11-07 (×20): 3 g via INTRAVENOUS
  Filled 2023-11-02 (×20): qty 8

## 2023-11-02 MED ORDER — VITAL HP 1.0 CAL PO LIQD
1000.0000 mL | ORAL | Status: DC
  Administered 2023-11-02 – 2023-11-03 (×2): 1000 mL

## 2023-11-02 MED ORDER — GLYCOPYRROLATE 0.2 MG/ML IJ SOLN
0.1000 mg | Freq: Once | INTRAMUSCULAR | Status: AC
  Administered 2023-11-02: 0.1 mg via INTRAVENOUS
  Filled 2023-11-02: qty 1

## 2023-11-02 MED ORDER — MIDAZOLAM HCL 2 MG/2ML IJ SOLN: 2.0000 mg | Freq: Once | INTRAMUSCULAR | Status: AC

## 2023-11-02 MED ORDER — PHENYLEPHRINE 80 MCG/ML (10ML) SYRINGE FOR IV PUSH (FOR BLOOD PRESSURE SUPPORT)
200.0000 ug | PREFILLED_SYRINGE | Freq: Once | INTRAVENOUS | Status: AC
  Administered 2023-11-02: 200 ug via INTRAVENOUS

## 2023-11-02 MED ORDER — FENTANYL 2500MCG IN NS 250ML (10MCG/ML) PREMIX INFUSION
0.0000 ug/h | INTRAVENOUS | Status: DC
  Administered 2023-11-02: 50 ug/h via INTRAVENOUS
  Administered 2023-11-03 – 2023-11-04 (×3): 200 ug/h via INTRAVENOUS
  Filled 2023-11-02 (×4): qty 250

## 2023-11-02 MED ORDER — PROPOFOL 1000 MG/100ML IV EMUL
0.0000 ug/kg/min | INTRAVENOUS | Status: DC
  Administered 2023-11-02: 50 ug/kg/min via INTRAVENOUS
  Administered 2023-11-02 (×2): 25 ug/kg/min via INTRAVENOUS
  Administered 2023-11-02: 55 ug/kg/min via INTRAVENOUS
  Administered 2023-11-02: 5 ug/kg/min via INTRAVENOUS
  Administered 2023-11-03 (×3): 45 ug/kg/min via INTRAVENOUS
  Filled 2023-11-02 (×8): qty 100

## 2023-11-02 MED ORDER — ROCURONIUM BROMIDE 10 MG/ML (PF) SYRINGE: 1.0000 mg/kg | PREFILLED_SYRINGE | Freq: Once | INTRAVENOUS | Status: AC

## 2023-11-02 MED ORDER — POTASSIUM PHOSPHATES 15 MMOLE/5ML IV SOLN: 15.0000 mmol | Freq: Once | INTRAVENOUS | Status: DC

## 2023-11-02 MED ORDER — FENTANYL CITRATE PF 50 MCG/ML IJ SOSY
50.0000 ug | PREFILLED_SYRINGE | INTRAMUSCULAR | Status: DC | PRN
  Administered 2023-11-03 – 2023-11-04 (×2): 50 ug via INTRAVENOUS
  Filled 2023-11-02: qty 1

## 2023-11-02 MED ORDER — EPINEPHRINE 0.1 MG/10ML (10 MCG/ML) SYRINGE FOR IV PUSH (FOR BLOOD PRESSURE SUPPORT): 40.0000 ug | PREFILLED_SYRINGE | INTRAVENOUS | Status: DC | PRN

## 2023-11-02 MED ORDER — ROCURONIUM BROMIDE 10 MG/ML (PF) SYRINGE
PREFILLED_SYRINGE | INTRAVENOUS | Status: AC
  Filled 2023-11-02: qty 10

## 2023-11-02 MED ORDER — ORAL CARE MOUTH RINSE: 15.0000 mL | OROMUCOSAL | Status: DC | PRN

## 2023-11-02 MED ORDER — NOREPINEPHRINE 4 MG/250ML-% IV SOLN
INTRAVENOUS | Status: AC
  Filled 2023-11-02: qty 250

## 2023-11-02 MED ORDER — DEXMEDETOMIDINE HCL IN NACL 200 MCG/50ML IV SOLN
0.0000 ug/kg/h | INTRAVENOUS | Status: DC
  Administered 2023-11-02: 0.4 ug/kg/h via INTRAVENOUS
  Filled 2023-11-02: qty 50

## 2023-11-02 MED ORDER — FENTANYL CITRATE PF 50 MCG/ML IJ SOSY
50.0000 ug | PREFILLED_SYRINGE | INTRAMUSCULAR | Status: DC | PRN
  Administered 2023-11-02 (×2): 100 ug via INTRAVENOUS
  Administered 2023-11-03 (×3): 200 ug via INTRAVENOUS
  Administered 2023-11-03 (×2): 150 ug via INTRAVENOUS
  Administered 2023-11-03: 200 ug via INTRAVENOUS
  Administered 2023-11-03: 100 ug via INTRAVENOUS
  Administered 2023-11-03: 200 ug via INTRAVENOUS
  Administered 2023-11-03: 150 ug via INTRAVENOUS
  Administered 2023-11-04 (×2): 200 ug via INTRAVENOUS
  Administered 2023-11-04: 150 ug via INTRAVENOUS
  Administered 2023-11-04 (×2): 200 ug via INTRAVENOUS
  Administered 2023-11-04: 100 ug via INTRAVENOUS
  Filled 2023-11-02 (×2): qty 2
  Filled 2023-11-02: qty 3

## 2023-11-02 MED ORDER — MIDAZOLAM HCL 2 MG/2ML IJ SOLN
INTRAMUSCULAR | Status: AC
  Administered 2023-11-02: 2 mg via INTRAVENOUS
  Filled 2023-11-02: qty 2

## 2023-11-02 MED ORDER — SPIRONOLACTONE 25 MG PO TABS
25.0000 mg | ORAL_TABLET | Freq: Every day | ORAL | Status: DC
  Administered 2023-11-02 – 2023-11-04 (×3): 25 mg
  Filled 2023-11-02 (×3): qty 1

## 2023-11-02 MED ORDER — POTASSIUM CHLORIDE 20 MEQ PO PACK
40.0000 meq | PACK | ORAL | Status: DC
  Filled 2023-11-02: qty 2

## 2023-11-02 MED ORDER — NOREPINEPHRINE 4 MG/250ML-% IV SOLN
0.0000 ug/min | INTRAVENOUS | Status: DC
  Administered 2023-11-02: 2 ug/min via INTRAVENOUS

## 2023-11-02 MED ORDER — ROCURONIUM BROMIDE 10 MG/ML (PF) SYRINGE
PREFILLED_SYRINGE | INTRAVENOUS | Status: AC
  Administered 2023-11-02: 160 mg via INTRAVENOUS
  Filled 2023-11-02: qty 10

## 2023-11-02 MED ORDER — PHENYLEPHRINE 80 MCG/ML (10ML) SYRINGE FOR IV PUSH (FOR BLOOD PRESSURE SUPPORT)
PREFILLED_SYRINGE | INTRAVENOUS | Status: AC
  Administered 2023-11-02: 200 ug
  Filled 2023-11-02: qty 10

## 2023-11-02 MED ORDER — PROSOURCE TF20 ENFIT COMPATIBL EN LIQD
60.0000 mL | Freq: Every day | ENTERAL | Status: DC
  Administered 2023-11-02 – 2023-11-03 (×2): 60 mL
  Filled 2023-11-02 (×3): qty 60

## 2023-11-02 MED ORDER — PANTOPRAZOLE SODIUM 40 MG IV SOLR
40.0000 mg | Freq: Every day | INTRAVENOUS | Status: DC
  Administered 2023-11-02 – 2023-11-04 (×3): 40 mg via INTRAVENOUS
  Filled 2023-11-02 (×3): qty 10

## 2023-11-02 MED ORDER — ORAL CARE MOUTH RINSE
15.0000 mL | OROMUCOSAL | Status: DC
  Administered 2023-11-02 – 2023-11-04 (×26): 15 mL via OROMUCOSAL

## 2023-11-02 MED ORDER — ORAL CARE MOUTH RINSE
15.0000 mL | OROMUCOSAL | Status: DC | PRN
Start: 2023-11-02 — End: 2023-11-04

## 2023-11-02 MED ORDER — LACTULOSE 10 GM/15ML PO SOLN
30.0000 g | Freq: Two times a day (BID) | ORAL | Status: DC
  Administered 2023-11-02 – 2023-11-03 (×3): 30 g
  Filled 2023-11-02 (×3): qty 45

## 2023-11-02 MED ORDER — KETAMINE HCL 50 MG/ML IJ SOLN
1.5000 mg/kg | Freq: Once | INTRAMUSCULAR | Status: DC
  Filled 2023-11-02: qty 5.1

## 2023-11-02 NOTE — TOC Initial Note (Signed)
 Transition of Care [facility]) - Initial/Assessment Note    Patient Details  Name: [name] MRN: [id number] Date of Birth: 04-Jun-1965  Transition of Care [facility]) CM/SW Contact:    Jon ONEIDA Anon, RN Phone Number: 11/02/2023, 10:37 AM  Clinical Narrative:                 Pt is from home with spouse. Pt currently on the ventilator and all history obtained from pt spouse Yassin Scales via telephone at [phone]. Pt spouse states pt has electric W/C, hospital bed, air mattress, and shower bench. Spouse states they have a close family friend that visits multiple times a week to assist with pt shower and light cleaning. States they would greatly benefit from home health service for PT/OT/HHA, as he does not currently receive any services. Denies any SDOH needs, or any other DME needs at this time. IP Care Management is following for any new recommendations or needs.   Expected Discharge Plan: Home w Home Health Services Barriers to Discharge: Continued Medical Work up   Patient Goals and CMS Choice Patient states their goals for this hospitalization and ongoing recovery are:: Per pt spouse, pt to return home CMS Medicare.gov Compare Post Acute Care list provided to:: Patient Represenative (must comment) (Pt spouse) Choice offered to / list presented to : Spouse [place] ownership interest in [facility].provided to:: Spouse    Expected Discharge Plan and Services In-house Referral: NA Discharge Planning Services: CM Consult Post Acute Care Choice: Home Health Living arrangements for the past 2 months: Single Family Home                 DME Arranged: N/A DME Agency: NA       HH Arranged: NA HH Agency: NA        Prior Living Arrangements/Services Living arrangements for the past 2 months: Single Family Home Lives with:: Spouse, Adult Children Patient language and need for interpreter reviewed:: Yes Do you feel safe going back to the place where you live?: Yes       Need for Family Participation in Patient Care: Yes (Comment) Care giver support system in place?: Yes (comment) Current home services: DME (Electric wheelchair, hospital bed, air mattress, shower bench) Criminal Activity/Legal Involvement Pertinent to Current Situation/Hospitalization: No - Comment as needed  Activities of Daily Living      Permission Sought/Granted Permission sought to share information with : Family Supports Permission granted to share information with : Yes, Verbal Permission Granted  Share Information with NAME: Jaquis, [place] (Spouse)  [phone]           Emotional Assessment Appearance:: Other (Comment Required (UTA) Attitude/Demeanor/Rapport: Unable to Assess   Orientation: :  (Pt currently on ventilator) Alcohol / Substance Use: Not Applicable Psych Involvement: No (comment)  Admission diagnosis:  Hypercapnia [R06.89] Sepsis (HCC) [A41.9] Acute on chronic respiratory failure with hypercapnia (HCC) [J96.22] Sepsis, due to unspecified organism, unspecified whether acute organ dysfunction present [facility]) [A41.9] Patient Active Problem List   Diagnosis Date Noted   Acute on chronic respiratory failure with hypercapnia (HCC) 11/02/2023   Sepsis (HCC) 10/30/2023   Encounter for pre-operative respiratory clearance 10/13/2022   Cellulitis 08/23/2022   Acute metabolic encephalopathy 04/01/2022   Complicated UTI (urinary tract infection) 04/01/2022   Sepsis due to cellulitis (HCC) 03/31/2022   Candidal intertrigo 03/31/2022   Opioid use disorder 01/06/2022   Genetic testing (CPY450 2D6: Normal activity) 11/21/2021   Abnormal MRI, lumbar spine (11/21/2021) 11/21/2021  Displacement of spinal infusion catheter 09/19/2021   Malfunction of intrathecal infusion pump (HCC) 09/19/2021   End of battery life of intrathecal infusion pump 09/19/2021   Irritable bowel syndrome 07/16/2021   Mild persistent asthma 07/16/2021   Moderate major depression, single  episode (HCC) 07/16/2021   Pure hypercholesterolemia 07/16/2021   Urinary incontinence 07/16/2021   Chronic respiratory failure (HCC) 07/16/2021   Constipation 07/16/2021   Sleep apnea 07/16/2021   Type 2 diabetes mellitus with other specified complication (HCC) 07/16/2021   Epidural fibrosis 11/22/2020   Weakness of lower extremities (Bilateral) 11/22/2020   Weakness of back 11/22/2020   Lumbosacral spondylosis with radiculopathy 11/22/2020   Physical deconditioning 11/22/2020   Type 2 diabetes mellitus with diabetic polyneuropathy, without long-term current use of insulin  (HCC) 11/22/2020   Type 2 diabetes mellitus with morbid obesity (HCC) 11/22/2020   Physical tolerance to opiate drug 11/22/2020   Unable to ambulate 11/22/2020   Chronic use of opiate for therapeutic purpose 07/19/2020   Uncomplicated opioid dependence (HCC) 03/15/2020   Restrictive airway disease 01/24/2020   Elevated blood-pressure reading, without diagnosis of hypertension 04/19/2019   Preoperative testing 09/13/2018   Altered mental status 07/11/2018   Fever    Myoclonic jerking    Obesity, morbid (more than 100 lbs over ideal weight or BMI > 40) (HCC) 06/30/2018   DDD (degenerative disc disease), thoracic 06/30/2018   Osteoarthritis of lumbar spine 06/30/2018   History of acute alcoholic pancreatitis 06/30/2018   Male hypogonadism 06/30/2018   Vitamin D  deficiency 06/30/2018   Chronic hyperglycemia 06/30/2018   Alcoholism (HCC) 06/18/2018   Alcohol-induced acute pancreatitis without infection or necrosis 06/18/2018   Acute alcoholic pancreatitis 06/11/2018   Hypokalemia 06/11/2018   Hyponatremia 06/11/2018   Chronic diastolic heart failure (HCC) 06/11/2018   Nausea and vomiting 06/10/2018   Abdominal pain 06/10/2018   Encounter for screening colonoscopy 06/10/2018   Morbid obesity with BMI of 45.0-49.9, adult (HCC) 05/25/2018   Secondary osteoarthritis of multiple sites 05/25/2018   Osteoarthritis of  hip (Right) 05/25/2018   Encounter for adjustment or management of infusion pump 04/01/2018   Lumbar spondylosis 01/26/2018   Spondylosis without myelopathy or radiculopathy, lumbosacral region 12/02/2017   Lumbar facet syndrome (Bilateral) (R>L) 12/02/2017   Chronic low back pain (1ry area of Pain) (Bilateral) (R>L) w/o sciatica 12/02/2017   Chronic knee pain after total replacement (Left) 12/01/2017   Chronic musculoskeletal pain 11/17/2017   Muscle spasticity 11/17/2017   Gastroesophageal reflux disease 11/17/2017   Neurogenic pain 11/17/2017   Drug-induced nausea and vomiting 11/17/2017   Therapeutic opioid-induced constipation (OIC) 11/17/2017   Chronic pain syndrome 11/09/2017   Long term prescription benzodiazepine use 11/09/2017   Long term prescription opiate use 11/09/2017   Opiate use (140 MME/day) (oral) 11/09/2017   Long-term use of high-risk medication 11/09/2017   Pharmacologic therapy 11/09/2017   Disorder of skeletal system 11/09/2017   Problems influencing health status 11/09/2017   Chronic nausea s/p bariatric surgery 11/09/2017   Presence of intrathecal pump ([employer]) 11/09/2017   Encounter for interrogation of infusion pump 11/09/2017   Chronic foot pain (Right) 11/09/2017   Chronic hip pain (Right) 11/09/2017   Old tear of medial meniscus of knee (Left) 11/09/2017   Failed back surgical syndrome 11/09/2017   Chronic low back pain (Bilateral) w/ sciatica (Bilateral) 11/09/2017   Chronic lower extremity pain (2ry area of Pain) (Bilateral) (L>R) 11/09/2017   Chronic lower extremity radicular pain 11/09/2017   Chronic shoulder pain (Bilateral) 11/09/2017  History of total knee replacement (Left) 11/09/2017   History of arthroscopic surgery of shoulder (Bilateral) 11/09/2017   History of bariatric surgery (September 2016) 01/08/2015   OSA complicated by mild polycythemia  10/21/2014   Polycythemia, secondary 10/21/2014   Dyspnea 10/20/2014   S/P rotator cuff  repair (Bilateral) 08/18/2013   DDD (degenerative disc disease), lumbosacral 10/20/2006   PCP:  Seabron Lenis, MD Pharmacy:   [facility] [place], [place] - [address] [place] [address] Phone: [phone] Fax: [phone]     Social Drivers of Health (SDOH) Social History: SDOH Screenings   Food Insecurity: No Food Insecurity (08/12/2022)   Received from [facility]  Housing: Low Risk  (04/01/2022)  Transportation Needs: No Transportation Needs (08/12/2022)   Received from [facility]  Utilities: Not At Risk (08/12/2022)   Received from [facility]  Depression (PHQ2-9): Low Risk  (03/19/2021)  Financial Resource Strain: Medium Risk (04/18/2022)   Received from [facility]  Physical Activity: Unknown (04/18/2022)   Received from [facility]  Recent Concern: Physical Activity - Inactive (04/18/2022)   Received from [facility]  Social Connections: Socially Isolated (04/18/2022)   Received from [facility]  Stress: No Stress Concern Present (11/05/2022)   Received from [facility]  Tobacco Use: High Risk (10/29/2023)   SDOH Interventions:     Readmission Risk Interventions    11/02/2023   10:31 AM 08/25/2022   11:46 AM  Readmission Risk Prevention Plan  Transportation Screening Complete Complete  PCP or Specialist Appt within 5-7 Days Complete   PCP or Specialist Appt within 3-5 Days  Complete  Home Care Screening Complete   Medication Review (RN CM) Complete   HRI or Home Care Consult  Complete  Social Work Consult for Recovery Care Planning/Counseling  Complete  Palliative Care Screening  Not Applicable  Medication Review Oceanographer)  Complete

## 2023-11-02 NOTE — Progress Notes (Signed)
   11/02/23 0421  BiPAP/CPAP/SIPAP  BiPAP/CPAP/SIPAP Pt Type Adult  BiPAP/CPAP/SIPAP SERVO (air)  Mask Type Full face mask  Dentures removed? Not applicable  Mask Size Medium  Set Rate 18 breaths/min  Respiratory Rate 19 breaths/min  IPAP 23 cmH20  EPAP 8 cmH2O  Pressure Support (S)  15 cmH20 (increased due to low VT)  PEEP 8 cmH20  FiO2 (%) (S)  70 % (increased due to patient desating)  Flow Rate 0 lpm  Minute Ventilation 7.4  Leak 49  Peak Inspiratory Pressure (PIP) 20  Tidal Volume (Vt) 377  Patient Home Machine No  Patient Home Mask No  Patient Home Tubing No  Auto Titrate No  Press High Alarm 30 cmH2O  Device Plugged into RED Power Outlet Yes  BiPAP/CPAP /SiPAP Vitals  Pulse Rate 92  Resp 12  SpO2 96 %  MEWS Score/Color  MEWS Score 1  MEWS Score Color Green

## 2023-11-02 NOTE — Progress Notes (Signed)
 Pharmacy Antibiotic Note  [name] is a 58 y.o. male admitted on 10/29/2023 with UTI and possible aspiration.  Pharmacy has been consulted for Unasyn  dosing.  ID: UTI. Possible aspiration even 7/21. - Temp 97, WBC WNL, Scr <1  -CTX 7/18>7/21 - Unasyn  7/21> - Cefepime  7/18 x 1 - Vanco 7/18 x 1  - 7/18:UCx>100,000 Ecoli pan S - 7/18: BC x 2: NGTD  7/18: MRSA PCR: neg  Plan: D/c Rocephin  after today (hung this AM) Start Unasyn  3g IV q6hr.   Height: 5' 11 (180.3 cm) Weight: (!) 169.9 kg (374 lb 9 oz) IBW/kg (Calculated) : 75.3  Temp (24hrs), Avg:98.2 F (36.8 C), Min:97.2 F (36.2 C), Max:99.3 F (37.4 C)  Recent Labs  Lab 10/30/23 0000 10/30/23 0100 10/30/23 0112 10/31/23 0310 11/01/23 0257 11/02/23 0255  WBC 10.1  --   --  8.0 7.2 8.7  CREATININE  --  0.53*  --  0.68 0.63 0.44*  LATICACIDVEN  --   --  1.0  --   --   --     Estimated Creatinine Clearance: 164.9 mL/min (A) (by C-G formula based on SCr of 0.44 mg/dL (L)).    Allergies  Allergen Reactions   Tape Other (See Comments)    Blisters - tolerates paper tape well    Stephens Shreve Karoline Marina, PharmD, BCPS Clinical Staff Pharmacist  Marina Salines [facility] 11/02/2023 8:31 AM

## 2023-11-02 NOTE — Plan of Care (Signed)
  Problem: Health Behavior/Discharge Planning: Goal: Ability to identify and utilize available resources and services will improve Outcome: Progressing   Problem: Nutritional: Goal: Maintenance of adequate nutrition will improve Outcome: Progressing   Problem: Tissue Perfusion: Goal: Adequacy of tissue perfusion will improve Outcome: Progressing

## 2023-11-02 NOTE — Plan of Care (Signed)
  Problem: Coping: Goal: Ability to adjust to condition or change in health will improve Outcome: Progressing   Problem: Fluid Volume: Goal: Ability to maintain a balanced intake and output will improve Outcome: Progressing   Problem: Metabolic: Goal: Ability to maintain appropriate glucose levels will improve Outcome: Progressing   Problem: Nutritional: Goal: Progress toward achieving an optimal weight will improve Outcome: Progressing   Problem: Skin Integrity: Goal: Risk for impaired skin integrity will decrease Outcome: Progressing   Problem: Tissue Perfusion: Goal: Adequacy of tissue perfusion will improve Outcome: Progressing   Problem: Clinical Measurements: Goal: Ability to maintain clinical measurements within normal limits will improve Outcome: Progressing Goal: Will remain free from infection Outcome: Progressing Goal: Diagnostic test results will improve Outcome: Progressing Goal: Respiratory complications will improve Outcome: Progressing Goal: Cardiovascular complication will be avoided Outcome: Progressing   Problem: Activity: Goal: Risk for activity intolerance will decrease Outcome: Progressing   Problem: Coping: Goal: Level of anxiety will decrease Outcome: Progressing   Problem: Elimination: Goal: Will not experience complications related to bowel motility Outcome: Progressing Goal: Will not experience complications related to urinary retention Outcome: Progressing   Problem: Pain Managment: Goal: General experience of comfort will improve and/or be controlled Outcome: Progressing   Problem: Safety: Goal: Ability to remain free from injury will improve Outcome: Progressing   Problem: Skin Integrity: Goal: Risk for impaired skin integrity will decrease Outcome: Progressing   Problem: Activity: Goal: Ability to tolerate increased activity will improve Outcome: Progressing   Problem: Respiratory: Goal: Ability to maintain a clear  airway and adequate ventilation will improve Outcome: Progressing   Problem: Role Relationship: Goal: Method of communication will improve Outcome: Progressing   Problem: Safety: Goal: Non-violent Restraint(s) Outcome: Progressing

## 2023-11-02 NOTE — Progress Notes (Signed)
 Initial Nutrition Assessment  DOCUMENTATION CODES:   Morbid obesity  INTERVENTION:   Once Cortrak placed and verified: -Initiate Vital AF 1.2 @ 20 ml/hr, advance by 10 ml every 4 hours to goal rate of 60 ml/hr. -60 ml Prosource TF BID -Provides 2176 kcals, 166g protein and 1362 ml H2O  NUTRITION DIAGNOSIS:   Increased nutrient needs related to acute illness as evidenced by estimated needs.  GOAL:   Patient will meet greater than or equal to 90% of their needs  MONITOR:   Vent status  REASON FOR ASSESSMENT:   Ventilator    ASSESSMENT:   58 yr old male patient with OSA/OHS, morbid obesity, D-CHF, and GERD who had UTI and sepsis. Admitted for AMS.  Patient in room, was intubated this morning. Wife and other family at bedside. Reports that pt has been eating normally PTA. No issues with eating.  Noted order for Cortrak placement, not placed as of visit this afternoon. Will await ability to start tube feeds. Noted OGT was placed but needed to be advanced. Will leave tube feed recommendations above.  Patient is currently intubated on ventilator support MV: 8 L/min Temp (24hrs), Avg:98.4 F (36.9 C), Min:97.2 F (36.2 C), Max:99.3 F (37.4 C) MAP: 74  Propofol : 25.5 ml/hr -provides ~673 fat kcals  Medications: Lasix , Lactulose , Aldactone , Levophed   Labs reviewed:  CBGs: 136   NUTRITION - FOCUSED PHYSICAL EXAM:  Flowsheet Row Most Recent Value  Orbital Region No depletion  Upper Arm Region No depletion  Thoracic and Lumbar Region No depletion  Buccal Region No depletion  Clavicle Bone Region No depletion  Clavicle and Acromion Bone Region No depletion  Scapular Bone Region No depletion  Dorsal Hand Unable to assess  [mitts]  Patellar Region No depletion  Anterior Thigh Region No depletion  Posterior Calf Region No depletion  Edema (RD Assessment) Severe  Hair Reviewed  Eyes Unable to assess  Mouth Unable to assess  Skin Reviewed  Nails Unable to assess     Diet Order:   Diet Order             Diet NPO time specified  Diet effective now                   EDUCATION NEEDS:   Not appropriate for education at this time  Skin:  Skin Assessment: Reviewed RN Assessment  Last BM:  7/18  Height:   Ht Readings from Last 1 Encounters:  11/01/23 5' 11 (1.803 m)    Weight:   Wt Readings from Last 1 Encounters:  11/02/23 (!) 169.9 kg     BMI:  Body mass index is 52.24 kg/m.  Estimated Nutritional Needs:   Kcal:  2000-2200  Protein:  160-175g  Fluid:  2L/day   Morna Lee, MS, RD, LDN Inpatient Clinical Dietitian Contact via Secure chat

## 2023-11-02 NOTE — Progress Notes (Signed)
 eLink Physician-Brief Progress Note Patient Name: [name] DOB: November 04, 1965 MRN: [id number]   Date of Service  11/02/2023  HPI/Events of Note  Patient dropping HR to 40's currently 59 at the moment. Patient was on Precedex  and this was switched to prop due to bradycardia and patient remains brady even on the propofol  currently at 55. Per bedside RN patient usually wears a fentanyl  pump at home and was wondering if he'd respond better to that. HR does not drop with the Fentanyl  pushes Ongoing norepinephrine  drip  eICU Interventions  Ordered to start Fentanyl  drip. This may facilitate coming off norepinephrine  Discussed with BSRN     Intervention Category Intermediate Interventions: Arrhythmia - evaluation and management;Other:  [name] 11/02/2023, 9:13 PM

## 2023-11-02 NOTE — Procedures (Signed)
 Intubation Procedure Note  [name]  [id number]  January 01, 1966  Date:11/02/23  Time:8:02 AM   Provider Performing:Pete E Jenna    Procedure: Intubation (31500)  Indication(s) Respiratory Failure  Consent Unable to obtain consent due to emergent nature of procedure.   Anesthesia Versed , Rocuronium , and ketamine    Time Out Verified patient identification, verified procedure, site/side was marked, verified correct patient position, special equipment/implants available, medications/allergies/relevant history reviewed, required imaging and test results available.   Sterile Technique Usual hand hygeine, masks, and gloves were used   Procedure Description Patient positioned in bed supine.  Sedation given as noted above.  Patient was intubated with endotracheal tube using Glidescope.  View was Grade 1 full glottis .  Number of attempts was 1.  Colorimetric CO2 detector was consistent with tracheal placement.   Complications/Tolerance Transient hypotension treated w/ IV epinephrine  bolus. No desaturation  Chest X-ray is ordered to verify placement.   EBL Minimal   Specimen(s) None

## 2023-11-02 NOTE — Progress Notes (Addendum)
 eLink Physician-Brief Progress Note Patient Name: [name] DOB: 19-Sep-1965 MRN: [id number]   Date of Service  11/02/2023  HPI/Events of Note  Patient on BiPAP.  Apparently choked on tobacco.  BiPAP currently off.  RT attempted to get a blood gas.  eICU Interventions  Video assessment of patient.  No accessory muscle use.  He has a good cough and gag on exam.  Oxygenation recovered once BiPAP mask placed back on.  Will obtain ABG to assess adequacy of oxygenation and ventilation.     Intervention Category Major Interventions: Hypoxemia - evaluation and management  [name] 11/02/2023, 6:30 AM  ABG resulted. 7.18/>123/94.  This was done after patient had been taken off BiPAP.  He is currently back on noninvasive.  Will order repeat ABG in 1 hour and reassess.   7:03 AM.

## 2023-11-02 NOTE — Progress Notes (Signed)
 Contacted provider as patient more lethargic, difficult to arouse. Noted cans of dip in his scrub pocket. Contacted provider and orders for abg obtained. Removed cans of snuff and notified charge RN and gave to charge nurse for disposal. Patient not coherent enough to explain tobacco policy. Attempted to contact spouse but no answer.

## 2023-11-02 NOTE — Progress Notes (Signed)
 NAME:  [name], MRN:  [id number], DOB:  January 30, 1966, LOS: 3 ADMISSION DATE:  10/29/2023, CONSULTATION DATE:  10/30/2023 REFERRING MD: Nettie Earing, MD, CHIEF COMPLAINT:  AMS for 2 days   History of Present Illness:  A 58 yr old male patient with OSA/OHS, morbid obesity, D-CHF, and GERD who had UTI and sepsis before leading to AMS per his wife. He has AMS for 2 days, he was found by EMS laying in his own urine and feces from 2 days. He has home aid that came yesterday but she wouldn't let her clean him up. He apparently fell out of his powerchair. Currently, he is altered on BiPAP and he cannot provide hx.   Pertinent  Medical History  OSA/OHS, morbid obesity, D-CHF, GERD, UTI sepsis   Significant Hospital Events: Including procedures, antibiotic start and stop dates in addition to other pertinent events   7/18: admit to ICU. Given 1.5 L LR, Vanco, Flagyl , and Cefepime  in ED.   7/20 antibiotics continued, getting a lactulose , cultures negative, interactive, requiring increased stimulation overnight in order to respond 7/21 had requested pain medication and received this around 1 AM, received Percocet.  Requiring sternal rub to respond.  Noted to have snuff in his mouth, with episode of desaturation noted by nursing staff.  Barely responsive in the morning on 7/21 arterial blood gas obtained On BiPAP pH is 7.18 PCO2 of greater than 123, O2 94 bicarbonate 50.  Daytime team contacted on arrival patient on BiPAP, he will open his eyes, he can follow commands but has bilateral lateral Asterix.  Rapidly falls back asleep, intubated  Interim History / Subjective:  This morning, minimally interactive, ABG reviewed, Objective    Blood pressure (!) 110/51, pulse 80, temperature (!) 97.2 F (36.2 C), temperature source Axillary, resp. rate 10, height 5' 11 (1.803 m), weight (!) 169.9 kg, SpO2 93%.    FiO2 (%):  [40 %-70 %] 70 % PEEP:  [8 cmH20] 8 cmH20 Pressure Support:  [15 cmH20] 15 cmH20    Intake/Output Summary (Last 24 hours) at 11/02/2023 0714 Last data filed at 11/01/2023 2353 Gross per 24 hour  Intake 460 ml  Output 1600 ml  Net -1140 ml   Filed Weights   10/31/23 0500 11/01/23 0350 11/02/23 0157  Weight: (!) 170.2 kg (!) 169.1 kg (!) 169.9 kg    Examination: General this is a morbidly obese chronically ill 58 year old white male he is currently on noninvasive positive pressure ventilation.  He will open his eyes to request, nods head, he can even follow commands however almost immediately falls asleep after cessation of stimulation HEENT normocephalic atraumatic his neck is largest difficult to assess jugular venous distention he has a BiPAP mask in place Pulmonary: Diminished throughout currently no accessory use.  He is currently on noninvasive positive pressure ventilation with a backup rate of 18 and total driving pressure of 23, tidal volumes very in the 200-400 range he does have an air leak portable chest X-ray personally reviewed this was from admission cardiomegaly diffuse pulmonary edema Cardiac regular rate and rhythm Abdomen obese bowel sounds present Extremities are warm dry pulses are strong he has diffuse edema Neuro he will awaken to voice however nods back to sleep almost immediately positive asterixis both upper extremities he is diffusely weak he is nonverbal currently GU he has an external Foley, draining yellow urine  Resolved problem list   Assessment and Plan   Acute metabolic encephalopathy secondary to sepsis, and acute on chronic  hypercarbic respiratory failure Status post fall (PTA) Clearly baseline CO2 retention, with calculated baseline CO2 69-73.  He did receive narcotics last evening, not clear to what extent this contributed Plan Continuing supportive care He will require intubation for airway protection at this point Continue to treat infection PAD protocol, RASS goal -2 Continuing the lactulose   E. coli UTI with greater than  100,000 colonies Plan Day #5 of 7 ceftriaxone  awaiting final sensitivities  Hyperammonemia, most likely passive congestion related Plan Continuing lactulose   Acute on chronic hypercarbic and hypoxic respiratory failure secondary to decompensated obstructive sleep apnea with obesity hypoventilation syndrome, presumed group 3 pulmonary hypertension, and decompensated diastolic heart failure -He has failed NIPPV -There is concern about possible aspiration of snuff Plan Full ventilator support, note baseline CO2 calculates around 70, will place arterial line and follow-up serial ABGs VAP bundle PAD protocol Obtain respiratory culture Cycle procalcitonin He is going to need aggressive diuresis Follow-up postintubation x-ray as well as x-ray in the morning  Drug related hypotension. Postintubation systolic blood pressure as low as mid 60s was brief, did not respond to phenylephrine  however had excellent response to 40 mcg of epinephrine  Plan Can repeat 40 mcg of IV epinephrine  as needed postintubation while we await norepinephrine  infusion at bedside Titrate norepinephrine  for mean arterial pressure greater than 65  Chronic pain syndrome, intrathecal pump in place - Continuous infusion mode fentanyl  -dose needs confirmed Plan Placed as needed bolus dosing while on ventilator VAP bundle as mentioned above  Hyperglycemia Plan Sliding scale insulin  Goal glucose 140-180  Best Practice (right click and Reselect all SmartList Selections daily)   Diet/type: tubefeeds DVT prophylaxis LMWH Pressure ulcer(s): N/A GI prophylaxis: PPI Lines: N/A Foley:  N/A Code Status:  full code Last date of multidisciplinary goals of care discussion []   45 minutes

## 2023-11-02 NOTE — Progress Notes (Signed)
 eLink Physician-Brief Progress Note Patient Name: [name] DOB: 01-20-1966 MRN: [id number]   Date of Service  11/02/2023  HPI/Events of Note  Patient with excessive amount of secretions on the vent.  eICU Interventions  Ordered Robinul  0.1 x 1 as suggested     Intervention Category Intermediate Interventions: Other:  [name] 11/02/2023, 8:23 PM

## 2023-11-02 NOTE — Progress Notes (Addendum)
 eLink Physician-Brief Progress Note Patient Name: [name] DOB: July 22, 1965 MRN: [id number]   Date of Service  11/02/2023  HPI/Events of Note  Phos < 1 Feeding just about to be started  eICU Interventions  eLink electrolyte replacement protocol in place Discussed with BSRN     Intervention Category Intermediate Interventions: Electrolyte abnormality - evaluation and management  [name] 11/02/2023, 8:49 PM

## 2023-11-02 NOTE — Procedures (Signed)
 Arterial Line Insertion Start/End7/21/2025 9:11 AM  Patient location: ICU. Preanesthetic checklist: patient identified, IV checked, site marked, risks and benefits discussed, surgical consent, monitors and equipment checked and timeout performed Right, radial was placed Catheter size: 20 G Hand hygiene performed , maximum sterile barriers used  and Seldinger technique used Allen's test indicative of satisfactory collateral circulation Attempts: 1 Procedure performed without using ultrasound guided technique. Following insertion, dressing applied and Biopatch. Post procedure assessment: normal  Patient tolerated the procedure well with no immediate complications.  Alarms Checked

## 2023-11-03 ENCOUNTER — Inpatient Hospital Stay (HOSPITAL_COMMUNITY)

## 2023-11-03 DIAGNOSIS — N390 Urinary tract infection, site not specified: Secondary | ICD-10-CM | POA: Diagnosis not present

## 2023-11-03 DIAGNOSIS — B9620 Unspecified Escherichia coli [E. coli] as the cause of diseases classified elsewhere: Secondary | ICD-10-CM | POA: Diagnosis not present

## 2023-11-03 DIAGNOSIS — J9612 Chronic respiratory failure with hypercapnia: Secondary | ICD-10-CM | POA: Diagnosis not present

## 2023-11-03 DIAGNOSIS — G9341 Metabolic encephalopathy: Secondary | ICD-10-CM | POA: Diagnosis not present

## 2023-11-03 LAB — BASIC METABOLIC PANEL WITH GFR
Anion gap: 13 (ref 5–15)
BUN: 20 mg/dL (ref 6–20)
CO2: 36 mmol/L — ABNORMAL HIGH (ref 22–32)
Calcium: 8.3 mg/dL — ABNORMAL LOW (ref 8.9–10.3)
Chloride: 88 mmol/L — ABNORMAL LOW (ref 98–111)
Creatinine, Ser: 0.69 mg/dL (ref 0.61–1.24)
GFR, Estimated: >60 mL/min (ref >60)
Glucose, Bld: 134 mg/dL — ABNORMAL HIGH (ref 70–99)
Potassium: 3.1 mmol/L — ABNORMAL LOW (ref 3.5–5.1)
Sodium: 137 mmol/L (ref 135–145)

## 2023-11-03 LAB — GLUCOSE, CAPILLARY
Glucose-Capillary: 112 mg/dL — ABNORMAL HIGH (ref 70–99)
Glucose-Capillary: 117 mg/dL — ABNORMAL HIGH (ref 70–99)
Glucose-Capillary: 121 mg/dL — ABNORMAL HIGH (ref 70–99)
Glucose-Capillary: 122 mg/dL — ABNORMAL HIGH (ref 70–99)
Glucose-Capillary: 123 mg/dL — ABNORMAL HIGH (ref 70–99)
Glucose-Capillary: 131 mg/dL — ABNORMAL HIGH (ref 70–99)
Glucose-Capillary: 133 mg/dL — ABNORMAL HIGH (ref 70–99)

## 2023-11-03 LAB — CBC
HCT: 34.7 % — ABNORMAL LOW (ref 39.0–52.0)
Hemoglobin: 10.2 g/dL — ABNORMAL LOW (ref 13.0–17.0)
MCH: 27.9 pg (ref 26.0–34.0)
MCHC: 29.4 g/dL — ABNORMAL LOW (ref 30.0–36.0)
MCV: 95.1 fL (ref 80.0–100.0)
Platelets: 280 K/uL (ref 150–400)
RBC: 3.65 MIL/uL — ABNORMAL LOW (ref 4.22–5.81)
RDW: 13.5 % (ref 11.5–15.5)
WBC: 8.8 K/uL (ref 4.0–10.5)
nRBC: 0.2 % (ref 0.0–0.2)

## 2023-11-03 LAB — TRIGLYCERIDES: Triglycerides: 333 mg/dL — ABNORMAL HIGH (ref <150)

## 2023-11-03 LAB — PHOSPHORUS: Phosphorus: 3 mg/dL (ref 2.5–4.6)

## 2023-11-03 LAB — PROCALCITONIN: Procalcitonin: <0.1 ng/mL

## 2023-11-03 MED ORDER — FUROSEMIDE 10 MG/ML IJ SOLN
80.0000 mg | Freq: Three times a day (TID) | INTRAMUSCULAR | Status: DC
  Administered 2023-11-03 – 2023-11-05 (×6): 80 mg via INTRAVENOUS
  Filled 2023-11-03 (×6): qty 8

## 2023-11-03 MED ORDER — POTASSIUM CHLORIDE 20 MEQ PO PACK
40.0000 meq | PACK | ORAL | Status: AC
  Administered 2023-11-03 (×3): 40 meq
  Filled 2023-11-03 (×3): qty 2

## 2023-11-03 MED ORDER — CLONAZEPAM 1 MG PO TBDP
1.0000 mg | ORAL_TABLET | Freq: Three times a day (TID) | ORAL | Status: DC
  Administered 2023-11-03 (×2): 1 mg
  Filled 2023-11-03 (×2): qty 1

## 2023-11-03 MED ORDER — LACTULOSE 10 GM/15ML PO SOLN
30.0000 g | Freq: Three times a day (TID) | ORAL | Status: DC
  Administered 2023-11-03: 30 g
  Filled 2023-11-03 (×2): qty 45

## 2023-11-03 MED ORDER — MAGNESIUM CITRATE PO SOLN: 1.0000 | Freq: Once | ORAL | Status: DC | PRN

## 2023-11-03 MED ORDER — POLYETHYLENE GLYCOL 3350 17 G PO PACK
17.0000 g | PACK | Freq: Every day | ORAL | Status: DC
  Administered 2023-11-03: 17 g
  Filled 2023-11-03 (×2): qty 1

## 2023-11-03 MED ORDER — POTASSIUM CHLORIDE 10 MEQ/100ML IV SOLN
10.0000 meq | INTRAVENOUS | Status: AC
  Administered 2023-11-03 (×4): 10 meq via INTRAVENOUS
  Filled 2023-11-03 (×4): qty 100

## 2023-11-03 MED ORDER — SENNOSIDES 8.8 MG/5ML PO SYRP
15.0000 mL | ORAL_SOLUTION | Freq: Two times a day (BID) | ORAL | Status: DC
  Administered 2023-11-03: 15 mL
  Filled 2023-11-03 (×4): qty 15

## 2023-11-03 MED ORDER — KETAMINE HCL-SODIUM CHLORIDE 1000-0.69 MG/100ML-% IV SOLN
0.3000 mg/kg/h | INTRAVENOUS | Status: DC
  Administered 2023-11-03 – 2023-11-04 (×3): 0.5 mg/kg/h via INTRAVENOUS
  Administered 2023-11-05: 0.3 mg/kg/h via INTRAVENOUS
  Filled 2023-11-03 (×4): qty 100

## 2023-11-03 MED ORDER — FLEET ENEMA RE ENEM: 1.0000 | ENEMA | Freq: Every day | RECTAL | Status: DC | PRN

## 2023-11-03 NOTE — Progress Notes (Signed)
 eLink Physician-Brief Progress Note Patient Name: [name] DOB: 10/31/1965 MRN: [id number]   Date of Service  11/03/2023  HPI/Events of Note  K is 3.1, phos is normal on Am labs  eICU Interventions  40 meq IV kcl ordered      Intervention Category Major Interventions: Electrolyte abnormality - evaluation and management  [name] [name] 11/03/2023, 6:30 AM

## 2023-11-03 NOTE — Progress Notes (Signed)
 Has done well today.  Tolerating diuresis well.  Mental status is excellent.  FiO2 requirements improving over the course of the day.  Seems to have fairly significant improved pain management with the ketamine  addition to his fentanyl  infusion Plan Will continue current diuretic strategy Start to wean fentanyl  in the morning, then assess for spontaneous breathing trial I think we can safely extubate him on ketamine , but would like to have him around the 0.25 mics per kilogram per hour dosing when we do that Pain management is going to continue to be a challenge going forward

## 2023-11-03 NOTE — Progress Notes (Signed)
 Vent alarmed. Patient needed suction. Got moderate amount of sputum. Patient restless. Gave 200mcg of fent bolus see MAR. Patient biting on tube. Patient reassured & reminded to not bite on tube. Patient still restless. Asked patient if he was in pain & he nodded yes. 200mcg of fent bolus given see MAR. O2 sats dropped to the 30s. Patient face blue. Bagged patient for 1 min. Patient's O2 sats back in the 90s. NP Jeralyn Banner notified. Patient stable at this time

## 2023-11-03 NOTE — Progress Notes (Signed)
 NAME:  [name], MRN:  [id number], DOB:  December 13, 1965, LOS: 4 ADMISSION DATE:  10/29/2023, CONSULTATION DATE:  10/30/2023 REFERRING MD: Nettie Earing, MD, CHIEF COMPLAINT:  AMS for 2 days   History of Present Illness:  A 59 yr old male patient with OSA/OHS, morbid obesity, D-CHF, and GERD who had UTI and sepsis before leading to AMS per his wife. He has AMS for 2 days, he was found by EMS laying in his own urine and feces from 2 days. He has home aid that came yesterday but she wouldn't let her clean him up. He apparently fell out of his powerchair. Currently, he is altered on BiPAP and he cannot provide hx.   Pertinent  Medical History  OSA/OHS, morbid obesity, D-CHF, GERD, UTI sepsis   Significant Hospital Events: Including procedures, antibiotic start and stop dates in addition to other pertinent events   7/18: admit to ICU. Given 1.5 L LR, Vanco, Flagyl , and Cefepime  in ED.   7/20 antibiotics continued, getting a lactulose , cultures negative, interactive, requiring increased stimulation overnight in order to respond 7/21 had requested pain medication and received this around 1 AM, received Percocet.  Requiring sternal rub to respond.  Noted to have snuff in his mouth, with episode of desaturation noted by nursing staff.  Barely responsive in the morning on 7/21 arterial blood gas obtained On BiPAP pH is 7.18 PCO2 of greater than 123, O2 94 bicarbonate 50.  Daytime team contacted on arrival patient on BiPAP, he will open his eyes, he can follow commands but has bilateral lateral Asterix.  Rapidly falls back asleep, intubated, ABX changed to Unasyn . Started aggressive diuresis. Radial a line placed.  7/22 some bradycardia during evening.   Interim History / Subjective:  He is awake and interactive. Reports significant pain (this is chronic) he is bedbound d/t the pain at baseline. Has the intrathecal pump. He got bradycardic w. Sedating meds. Failed attempt at PSV (RR increased VTs mid  300s) Objective    Blood pressure (!) 120/51, pulse (!) 54, temperature 98.7 F (37.1 C), temperature source Axillary, resp. rate 16, height 5' 11 (1.803 m), weight (!) 169.8 kg, SpO2 95%.    Vent Mode: PRVC FiO2 (%):  [60 %-100 %] 60 % Set Rate:  [16 bmp-20 bmp] 16 bmp Vt Set:  [600 mL] 600 mL PEEP:  [10 cmH20] 10 cmH20 Plateau Pressure:  [22 cmH20-27 cmH20] 22 cmH20   Intake/Output Summary (Last 24 hours) at 11/03/2023 0721 Last data filed at 11/03/2023 9357 Gross per 24 hour  Intake 2666.82 ml  Output 1950 ml  Net 716.82 ml   Filed Weights   11/01/23 0350 11/02/23 0157 11/03/23 0500  Weight: (!) 169.1 kg (!) 169.9 kg (!) 169.8 kg    Examination: General this is a 58 year old chronically ill obese male. He is awake today. Interactive and appropriate. Not in distress but does have marked discomfort which he reports as chronic HENT orally intubated PERRL  Pulm dec bases. FIO2 down to 50% pplat 22 driving pressures 10. Attempted PSV w/ total pressure 15/peep 10 VTs mid 300s. Rate increased. Endorsed some inc in SOB Pcxr ett good position. Chronically elevated right HD. Aeration a little worse  Card rrr Abd soft Ext dependent edema warm brisk CR Neuro awake cooperative. Follows commands.  Gu cl  yellow Resolved problem list   Assessment and Plan   Acute metabolic encephalopathy secondary to sepsis, and acute on chronic hypercarbic respiratory failure,  Status post fall (  PTA), has h/o chronic pain w/ intrathecal pain pump Clearly baseline CO2 retention, with calculated baseline CO2 69-73.   Plan PAD protocol RASS goal -1 Pain rx adjusted. Fent gtt started. Prop stopped due to rising triglycerides, got bradycardic w/ dex and prop Adding ketamine  for augmentation of his pain regimen Dose rec: 0.1-1 mg/kg/hr IV infusion (max 2 mg/kg/hr). Can increase dose by 0.25 mg/kg/hr every 30 minutes. Will start at 0.5mg /kg/hr Supportive care Ensure he has bowel regimen   Acute on  chronic hypercarbic and hypoxic respiratory failure secondary to decompensated obstructive sleep apnea with obesity hypoventilation syndrome, presumed group 3 pulmonary hypertension, and decompensated diastolic heart failure -Failure felt multifactorial. Narcotics, possible aspiration, overload. I think his chronic pain needs are impacting his baseline  -cxr worse Failed PSV attempt but awake today  Plan Full vent support Will hold at higher PEEP given body habitus Will increase lasix  to q 8 (just now at negative balance)  Day 2 Unasyn  (will tx asp for total of 5d) f/u pending resp cultures PAD protocol RASS goal -1 VAP bundle  Am cxr  E. coli UTI with greater than 100,000 colonies Plan Day #6 abx. Changed to unasyn  for asp but also covers this.  Abx stop date placed  Fluid and Electrolyte imbalance: hypokalemia,  Plan Replace   Drug related hypotension. Sedation related. Still on low dose NE Plan Cont norepi for MAP >65 Add scheduled midodrine   Hyperammonemia, most likely passive congestion related Plan Continuing lactulose   Hyperglycemia Plan Sliding scale insulin  Goal glucose 140-180  Best Practice (right click and Reselect all SmartList Selections daily)   Diet/type: tubefeeds DVT prophylaxis LMWH Pressure ulcer(s): N/A GI prophylaxis: PPI Lines: N/A Foley:  N/A Code Status:  full code Last date of multidisciplinary goals of care discussion []   40 minutes

## 2023-11-03 NOTE — Progress Notes (Signed)
 Nutrition Follow-up  DOCUMENTATION CODES:   Morbid obesity  INTERVENTION:  - Current TF regimen via OGT: Vital HP @ 40mL/hr  - Once able to advance, recommend: Vital 1.5 at 50 ml/h (1200 ml per day) Prosource TF20 60 ml QID Provides 2120 kcal, 161 gm protein, 917 ml free water daily  - FWF per CCM.  NUTRITION DIAGNOSIS:   Increased nutrient needs related to acute illness as evidenced by estimated needs. *ongoing  GOAL:   Patient will meet greater than or equal to 90% of their needs *progressing  MONITOR:   Vent status  REASON FOR ASSESSMENT:   Consult Enteral/tube feeding initiation and management (trickle tube feeds)  ASSESSMENT:   58 yr old male patient with OSA/OHS, morbid obesity, D-CHF, and GERD who had UTI and sepsis. Admitted for AMS.  7/18 Admit 7/19 Intubated; OGT placed  Patient started on TF protocol yesterday evening. Remains on Vital HP @ 87mL/hr with no noted intolerances.  Consulted for tube feed recs, will leave above for once able to advance.   Now off propofol , suspect due to triglycerides coming back at 333. Remains on Levo @ 2.    Admit weight: 341# Current weight: 374# I&O's: -1L since admit + for mild pitting generalized edema and mild pitting RUE/LUE and RLE/LLE edema  Medications reviewed and include: Lasix , Lactulose  BID, Protonix  Fentanyl  Ketamine  Levophed  @ 2 mcg/min  Labs reviewed:  K+ 3.1 Triglycerides 333 (as of 7/22)  Diet Order:   Diet Order             Diet NPO time specified  Diet effective now                   EDUCATION NEEDS:  Not appropriate for education at this time  Skin:  Skin Assessment: Reviewed RN Assessment  Last BM:  7/18  Height:  Ht Readings from Last 1 Encounters:  11/01/23 5' 11 (1.803 m)   Weight:  Wt Readings from Last 1 Encounters:  11/03/23 (!) 169.8 kg   Ideal Body Weight:  78.18 kg  BMI:  Body mass index is 52.21 kg/m.  Estimated Nutritional Needs:  Kcal:   2000-2200 Protein:  160-175g Fluid:  2L/day    Trude Ned RD, LDN Contact via Secure Chat.

## 2023-11-03 NOTE — Plan of Care (Signed)
 Deklan Minar, RN

## 2023-11-03 NOTE — Plan of Care (Signed)
  Problem: Metabolic: Goal: Ability to maintain appropriate glucose levels will improve Outcome: Progressing   Problem: Nutritional: Goal: Maintenance of adequate nutrition will improve Outcome: Progressing   Problem: Tissue Perfusion: Goal: Adequacy of tissue perfusion will improve Outcome: Progressing   Problem: Clinical Measurements: Goal: Ability to maintain clinical measurements within normal limits will improve Outcome: Progressing Goal: Respiratory complications will improve Outcome: Progressing   Problem: Activity: Goal: Risk for activity intolerance will decrease Outcome: Progressing   Problem: Nutrition: Goal: Adequate nutrition will be maintained Outcome: Progressing   Problem: Coping: Goal: Level of anxiety will decrease Outcome: Progressing

## 2023-11-04 DIAGNOSIS — J9622 Acute and chronic respiratory failure with hypercapnia: Secondary | ICD-10-CM | POA: Diagnosis not present

## 2023-11-04 DIAGNOSIS — G9341 Metabolic encephalopathy: Secondary | ICD-10-CM | POA: Diagnosis not present

## 2023-11-04 DIAGNOSIS — I5033 Acute on chronic diastolic (congestive) heart failure: Secondary | ICD-10-CM | POA: Diagnosis not present

## 2023-11-04 DIAGNOSIS — A419 Sepsis, unspecified organism: Secondary | ICD-10-CM | POA: Diagnosis not present

## 2023-11-04 LAB — GLUCOSE, CAPILLARY
Glucose-Capillary: 114 mg/dL — ABNORMAL HIGH (ref 70–99)
Glucose-Capillary: 129 mg/dL — ABNORMAL HIGH (ref 70–99)
Glucose-Capillary: 112 mg/dL — ABNORMAL HIGH (ref 70–99)
Glucose-Capillary: 110 mg/dL — ABNORMAL HIGH (ref 70–99)

## 2023-11-04 LAB — BASIC METABOLIC PANEL WITH GFR
Anion gap: 15 (ref 5–15)
Anion gap: 13 (ref 5–15)
BUN: 18 mg/dL (ref 6–20)
BUN: 14 mg/dL (ref 6–20)
CO2: 37 mmol/L — ABNORMAL HIGH (ref 22–32)
CO2: 39 mmol/L — ABNORMAL HIGH (ref 22–32)
Calcium: 8.6 mg/dL — ABNORMAL LOW (ref 8.9–10.3)
Calcium: 8.9 mg/dL (ref 8.9–10.3)
Chloride: 90 mmol/L — ABNORMAL LOW (ref 98–111)
Chloride: 89 mmol/L — ABNORMAL LOW (ref 98–111)
Creatinine, Ser: 0.71 mg/dL (ref 0.61–1.24)
Creatinine, Ser: 0.66 mg/dL (ref 0.61–1.24)
GFR, Estimated: >60 mL/min (ref >60)
GFR, Estimated: >60 mL/min (ref >60)
Glucose, Bld: 130 mg/dL — ABNORMAL HIGH (ref 70–99)
Glucose, Bld: 142 mg/dL — ABNORMAL HIGH (ref 70–99)
Potassium: 3.8 mmol/L (ref 3.5–5.1)
Potassium: 4.4 mmol/L (ref 3.5–5.1)
Sodium: 142 mmol/L (ref 135–145)
Sodium: 141 mmol/L (ref 135–145)

## 2023-11-04 LAB — CULTURE, BLOOD (ROUTINE X 2)
Culture: NO GROWTH
Culture: NO GROWTH
Special Requests: ADEQUATE

## 2023-11-04 LAB — CBC
HCT: 38.3 % — ABNORMAL LOW (ref 39.0–52.0)
Hemoglobin: 10.8 g/dL — ABNORMAL LOW (ref 13.0–17.0)
MCH: 27 pg (ref 26.0–34.0)
MCHC: 28.2 g/dL — ABNORMAL LOW (ref 30.0–36.0)
MCV: 95.8 fL (ref 80.0–100.0)
Platelets: 279 K/uL (ref 150–400)
RBC: 4 MIL/uL — ABNORMAL LOW (ref 4.22–5.81)
RDW: 13.8 % (ref 11.5–15.5)
WBC: 8.2 K/uL (ref 4.0–10.5)
nRBC: 0.2 % (ref 0.0–0.2)

## 2023-11-04 LAB — CULTURE, RESPIRATORY W GRAM STAIN
Culture: NORMAL
Gram Stain: NONE SEEN

## 2023-11-04 LAB — PROCALCITONIN: Procalcitonin: <0.1 ng/mL

## 2023-11-04 LAB — PHOSPHORUS: Phosphorus: 3.9 mg/dL (ref 2.5–4.6)

## 2023-11-04 LAB — MAGNESIUM: Magnesium: 1.9 mg/dL (ref 1.7–2.4)

## 2023-11-04 MED ORDER — POTASSIUM CHLORIDE 20 MEQ PO PACK
40.0000 meq | PACK | ORAL | Status: AC
  Administered 2023-11-04 (×2): 40 meq via ORAL
  Filled 2023-11-04 (×2): qty 2

## 2023-11-04 MED ORDER — SPIRONOLACTONE 25 MG PO TABS: 25.0000 mg | ORAL_TABLET | Freq: Every day | ORAL | Status: DC

## 2023-11-04 MED ORDER — POTASSIUM CHLORIDE 20 MEQ PO PACK
40.0000 meq | PACK | ORAL | Status: DC
  Administered 2023-11-04: 40 meq
  Filled 2023-11-04: qty 2

## 2023-11-04 MED ORDER — MAGNESIUM SULFATE 2 GM/50ML IV SOLN
2.0000 g | Freq: Once | INTRAVENOUS | Status: AC
  Administered 2023-11-04: 2 g via INTRAVENOUS
  Filled 2023-11-04: qty 50

## 2023-11-04 MED ORDER — SENNOSIDES 8.8 MG/5ML PO SYRP
15.0000 mL | ORAL_SOLUTION | Freq: Two times a day (BID) | ORAL | Status: DC
  Administered 2023-11-05: 15 mL via ORAL
  Filled 2023-11-04 (×3): qty 15

## 2023-11-04 MED ORDER — LACTULOSE 10 GM/15ML PO SOLN
30.0000 g | Freq: Three times a day (TID) | ORAL | Status: DC
  Administered 2023-11-04: 30 g via ORAL
  Filled 2023-11-04: qty 45

## 2023-11-04 MED ORDER — POLYETHYLENE GLYCOL 3350 17 G PO PACK
17.0000 g | PACK | Freq: Every day | ORAL | Status: DC
  Administered 2023-11-07 – 2023-11-09 (×3): 17 g via ORAL
  Filled 2023-11-04 (×5): qty 1

## 2023-11-04 NOTE — Progress Notes (Signed)
 Patient passed his Swallow Screen test without having to cough or stop drinking with 3 oz of water.

## 2023-11-04 NOTE — Progress Notes (Signed)
 Extubated earlier today  Has tolerated post-extubation bipap He is now on [place] Fully engaged and visiting w/ family.  Looks good Plan Will change BIPAP to HS and PRN Cont rest of care as outlined this am

## 2023-11-04 NOTE — Progress Notes (Signed)
 NAME:  [name], MRN:  [id number], DOB:  18-Aug-1965, LOS: 5 ADMISSION DATE:  10/29/2023, CONSULTATION DATE:  10/30/2023 REFERRING MD: Nettie Earing, MD, CHIEF COMPLAINT:  AMS for 2 days   History of Present Illness:  A 58 yr old male patient with OSA/OHS, morbid obesity, D-CHF, and GERD who had UTI and sepsis before leading to AMS per his wife. He has AMS for 2 days, he was found by EMS laying in his own urine and feces from 2 days. He has home aid that came yesterday but she wouldn't let her clean him up. He apparently fell out of his powerchair. Currently, he is altered on BiPAP and he cannot provide hx.   Pertinent  Medical History  OSA/OHS, morbid obesity, D-CHF, GERD, UTI sepsis   Significant Hospital Events: Including procedures, antibiotic start and stop dates in addition to other pertinent events   7/18: admit to ICU. Given 1.5 L LR, Vanco, Flagyl , and Cefepime  in ED.   7/20 antibiotics continued, getting a lactulose , cultures negative, interactive, requiring increased stimulation overnight in order to respond 7/21 had requested pain medication and received this around 1 AM, received Percocet.  Requiring sternal rub to respond.  Noted to have snuff in his mouth, with episode of desaturation noted by nursing staff.  Barely responsive in the morning on 7/21 arterial blood gas obtained On BiPAP pH is 7.18 PCO2 of greater than 123, O2 94 bicarbonate 50.  Daytime team contacted on arrival patient on BiPAP, he will open his eyes, he can follow commands but has bilateral lateral Asterix.  Rapidly falls back asleep, intubated, ABX changed to Unasyn . Started aggressive diuresis. Radial a line placed.  7/22 some bradycardia during evening.  Bowel movement achieved after bowel regimen initiated.  Started ketamine  infusion at 0.5 mg/kg/h, required low-dose clonazepam  with concern about hallucination.  Continued aggressive diuresis, weaning oxygen  7/23 fentanyl  discontinued ketamine  decreased to  0.3 mg/kg/hr, clonazepam  discontinued with ketamine  being at subdissociation dosing, Lasix  continued.  Weaning trials initiated  Interim History / Subjective:  Resting comfortably, fully interactive.  Reports pain is controlled Placed on spontaneous breathing trial, as long as he is stimulated he will pull 500 to 700 cc tidal volumes Objective    Blood pressure 123/68, pulse 71, temperature 98.8 F (37.1 C), temperature source Axillary, resp. rate 16, height 5' 11 (1.803 m), weight (!) 165.4 kg, SpO2 97%.    Vent Mode: PRVC FiO2 (%):  [50 %-80 %] 50 % Set Rate:  [16 bmp] 16 bmp Vt Set:  [600 mL] 600 mL PEEP:  [10 cmH20] 10 cmH20 Plateau Pressure:  [24 cmH20-36 cmH20] 24 cmH20   Intake/Output Summary (Last 24 hours) at 11/04/2023 9178 Last data filed at 11/04/2023 0748 Gross per 24 hour  Intake 2519.07 ml  Output 4100 ml  Net -1580.93 ml   Filed Weights   11/02/23 0157 11/03/23 0500 11/04/23 0332  Weight: (!) 169.9 kg (!) 169.8 kg (!) 165.4 kg    Examination: General This is a 58 year old white male he is currently lying in bed he is in no acute distress.  He is essentially bedbound at baseline, does get to wheelchair, we have switched him over to pressure support ventilation and he appears comfortable however does have occasional periods of apnea (note just stopped fentanyl  infusion) HEENT normocephalic atraumatic sclera nonicteric orally intubated unable to assess jugular venous distention Pulmonary: Diminished both bases, currently on pressure support of 5, PEEP of 8, tidal volumes in the 400 range on  average, no accessory use, his minute ventilation will drop when not stimulated suspect secondary to residual sedation effect from the fentanyl  Cardiac regular rate and rhythm Abdomen is soft nontender Extremities are warm and dry pulses are strong he continues to have some dependent edema Neuro he is awake interactive follows commands he is in no distress has no focal deficits GU  clear yellow  Resolved problem list  Drug related hypotension resolved Assessment and Plan   Acute metabolic encephalopathy secondary to sepsis, and acute on chronic hypercarbic respiratory failure,  Status post fall (PTA), has h/o chronic pain w/ intrathecal pain pump Clearly baseline CO2 retention, with acute on chronic hypercarbic failure driving his encephalopathy as well.  This seems much improved.  Added ketamine  on 7/22 now off from fentanyl  infusion Plan PAD protocol RASS goal -0 Discontinue all  IV fentanyl  ketamine  for augmentation of his pain regimen, started on 0.5 mg/kg/h on 7/22, changed to 0.3 mg/kg/h on 7/23 with plan to discontinue ketamine  on 7/24 I think we can discontinue the clonazepam  now that he is on lower doses of ketamine  and not near dissociative doses  Continue bowel regimen  He does have his intrathecal fentanyl  pump for baseline pain management  Acute on chronic hypercarbic and hypoxic respiratory failure secondary to decompensated obstructive sleep apnea with obesity hypoventilation syndrome, presumed group 3 pulmonary hypertension, and decompensated diastolic heart failure -baseline CO2 calculates to ~ 70.  Failure felt multifactorial. Narcotics, possible aspiration, overload. I think his chronic pain needs definitely contributed.  - He has tolerated diuresis well.  He is now down 1.7 L, currently on pressure support ventilation with acceptable minute ventilation and F/VT -Fully awake, we have been able to discontinue the fentanyl   Plan Continue to push Lasix  and spironolactone  as long as BUN/creatinine and blood pressure will allow Day 3 Unasyn  (Rx for total of 5d) f/u pending resp cultures, but nothing to date PAD protocol RASS goal - 0 VAP bundle  He will need to cycle BiPAP postextubation on a mandatory at at bedtime and as needed during the day regimen  E. coli UTI with greater than 100,000 colonies Plan Day #7 abx. Changed to unasyn  for asp but  also covers this.  Abx stop date placed  Intermittent fluid and Electrolyte imbalance secondary to aggressive diuresis Plan Prophylactically replacing potassium and magnesium   Hyperammonemia, most likely passive congestion related Plan Continuing lactulose   Hyperglycemia Excellent glycemic control  plan Sliding scale insulin  Goal glucose 140-180  Best Practice (right click and Reselect all SmartList Selections daily)   Diet/type: tubefeeds DVT prophylaxis LMWH Pressure ulcer(s): N/A GI prophylaxis: PPI Lines: N/A Foley:  N/A Code Status:  full code Last date of multidisciplinary goals of care discussion []   45 min

## 2023-11-04 NOTE — Progress Notes (Signed)
 Nutrition Follow-up  DOCUMENTATION CODES:   Morbid obesity  INTERVENTION:  - Diet advancement pending bedside swallow eval.   - If unable to advance oral diet, could consider post-pyloric Cortrak while patient requiring BiPAP.   NUTRITION DIAGNOSIS:   Increased nutrient needs related to acute illness as evidenced by estimated needs. *ongoing  GOAL:   Patient will meet greater than or equal to 90% of their needs *not met  MONITOR:   Vent status  REASON FOR ASSESSMENT:   Consult Enteral/tube feeding initiation and management (trickle tube feeds)  ASSESSMENT:   58 yr old male patient with OSA/OHS, morbid obesity, D-CHF, and GERD who had UTI and sepsis. Admitted for AMS.  7/18 Admit 7/19 Intubated; OGT placed 7/23 Extubated to BiPAP  Patient extubated today to biPAP. Per RN, plan for bedside swallow eval once patient comes off BiPAP.   Per CCM, patient to be on mandatory BiPAP at night. If unable to advance oral diet, would recommend post-pyloric Cortrak.      Admit weight: 341# Current weight: 364# I&O's: -2.2L since admit + for non-pitting generalized edema   Medications reviewed and include: Lasix , Lactulose  TID, Miralax , Senokot Ketamine    Labs reviewed:  -   Diet Order:   Diet Order             Diet NPO time specified  Diet effective now                   EDUCATION NEEDS:  Not appropriate for education at this time  Skin:  Skin Assessment: Reviewed RN Assessment  Last BM:  7/22  Height:  Ht Readings from Last 1 Encounters:  11/01/23 5' 11 (1.803 m)   Weight:  Wt Readings from Last 1 Encounters:  11/04/23 (!) 165.4 kg   Ideal Body Weight:  78.18 kg  BMI:  Body mass index is 50.86 kg/m.  Estimated Nutritional Needs:  Kcal:  2400-2600 kcals Protein:  100-120 grams Fluid:  >/= 2.4L   Trude Ned RD, LDN Contact via Secure Chat.

## 2023-11-05 DIAGNOSIS — A419 Sepsis, unspecified organism: Secondary | ICD-10-CM | POA: Diagnosis not present

## 2023-11-05 DIAGNOSIS — J9622 Acute and chronic respiratory failure with hypercapnia: Secondary | ICD-10-CM | POA: Diagnosis not present

## 2023-11-05 DIAGNOSIS — R0689 Other abnormalities of breathing: Principal | ICD-10-CM

## 2023-11-05 DIAGNOSIS — I5033 Acute on chronic diastolic (congestive) heart failure: Secondary | ICD-10-CM | POA: Diagnosis not present

## 2023-11-05 DIAGNOSIS — G9341 Metabolic encephalopathy: Secondary | ICD-10-CM | POA: Diagnosis not present

## 2023-11-05 LAB — PROCALCITONIN: Procalcitonin: <0.1 ng/mL

## 2023-11-05 LAB — MAGNESIUM: Magnesium: 2.3 mg/dL (ref 1.7–2.4)

## 2023-11-05 LAB — PHOSPHORUS: Phosphorus: 3.6 mg/dL (ref 2.5–4.6)

## 2023-11-05 MED ORDER — PANTOPRAZOLE SODIUM 40 MG PO TBEC
40.0000 mg | DELAYED_RELEASE_TABLET | Freq: Every day | ORAL | Status: DC
  Administered 2023-11-05 – 2023-11-09 (×5): 40 mg via ORAL
  Filled 2023-11-05 (×5): qty 1

## 2023-11-05 MED ORDER — FUROSEMIDE 10 MG/ML IJ SOLN
40.0000 mg | Freq: Two times a day (BID) | INTRAMUSCULAR | Status: DC
  Administered 2023-11-05 – 2023-11-09 (×8): 40 mg via INTRAVENOUS
  Filled 2023-11-05 (×8): qty 4

## 2023-11-05 MED ORDER — DULOXETINE HCL 30 MG PO CPEP
90.0000 mg | ORAL_CAPSULE | Freq: Every day | ORAL | Status: DC
  Administered 2023-11-05 – 2023-11-09 (×5): 90 mg via ORAL
  Filled 2023-11-05 (×5): qty 3

## 2023-11-05 MED ORDER — LACTULOSE 10 GM/15ML PO SOLN
20.0000 g | Freq: Two times a day (BID) | ORAL | Status: DC
  Administered 2023-11-05: 20 g via ORAL
  Filled 2023-11-05 (×2): qty 30

## 2023-11-05 NOTE — Progress Notes (Signed)
   11/05/23 0110  BiPAP/CPAP/SIPAP  $ Non-Invasive Ventilator  Non-Invasive Vent Subsequent  BiPAP/CPAP/SIPAP Pt Type Adult  BiPAP/CPAP/SIPAP SERVO  Mask Type Full face mask  Mask Size Medium  Set Rate 15 breaths/min  Respiratory Rate 21 breaths/min  IPAP 14 cmH20  EPAP 6 cmH2O  FiO2 (%) 40 %  Minute Ventilation 14.2  Leak 38  Peak Inspiratory Pressure (PIP) 14  Tidal Volume (Vt) 459  Patient Home Machine No  Patient Home Mask No  Patient Home Tubing No  Auto Titrate No  Press High Alarm 15 cmH2O  CPAP/SIPAP surface wiped down Yes  Device Plugged into RED Power Outlet Yes  Oxygen  Percent 40 %  BiPAP/CPAP /SiPAP Vitals  Pulse Rate 77  Resp 17  SpO2 97 %  MEWS Score/Color  MEWS Score 0  MEWS Score Color [place]

## 2023-11-05 NOTE — Plan of Care (Signed)
  Problem: Coping: Goal: Ability to adjust to condition or change in health will improve Outcome: Progressing   Problem: Fluid Volume: Goal: Ability to maintain a balanced intake and output will improve Outcome: Progressing   Problem: Nutritional: Goal: Maintenance of adequate nutrition will improve Outcome: Progressing   Problem: Skin Integrity: Goal: Risk for impaired skin integrity will decrease Outcome: Progressing   Problem: Tissue Perfusion: Goal: Adequacy of tissue perfusion will improve Outcome: Progressing   Problem: Clinical Measurements: Goal: Ability to maintain clinical measurements within normal limits will improve Outcome: Progressing

## 2023-11-05 NOTE — Progress Notes (Addendum)
 NAME:  [name], MRN:  [id number], DOB:  [birth year], LOS: 6 ADMISSION DATE:  10/29/2023, CONSULTATION DATE:  10/30/2023 REFERRING MD: Nettie Earing, MD, CHIEF COMPLAINT:  AMS for 2 days   History of Present Illness:  A 58 yr old male patient with OSA/OHS, morbid obesity, D-CHF, and GERD who had UTI and sepsis before leading to AMS per his wife. He has AMS for 2 days, he was found by EMS laying in his own urine and feces from 2 days. He has home aid that came yesterday but she wouldn't let her clean him up. He apparently fell out of his powerchair. Currently, he is altered on BiPAP and he cannot provide hx.   Pertinent  Medical History  OSA/OHS, morbid obesity, D-CHF, GERD, UTI sepsis   Significant Hospital Events: Including procedures, antibiotic start and stop dates in addition to other pertinent events   7/18: admit to ICU. Given 1.5 L LR, Vanco, Flagyl , and Cefepime  in ED.   7/20 antibiotics continued, getting a lactulose , cultures negative, interactive, requiring increased stimulation overnight in order to respond 7/21 had requested pain medication and received this around 1 AM, received Percocet.  Requiring sternal rub to respond.  Noted to have snuff in his mouth, with episode of desaturation noted by nursing staff.  Barely responsive in the morning on 7/21 arterial blood gas obtained On BiPAP pH is 7.18 PCO2 of greater than 123, O2 94 bicarbonate 50.  Daytime team contacted on arrival patient on BiPAP, he will open his eyes, he can follow commands but has bilateral lateral Asterix.  Rapidly falls back asleep, intubated, ABX changed to Unasyn . Started aggressive diuresis. Radial a line placed.  7/22 some bradycardia during evening.  Bowel movement achieved after bowel regimen initiated.  Started ketamine  infusion at 0.5 mg/kg/h, required low-dose clonazepam  with concern about hallucination.  Continued aggressive diuresis, weaning oxygen  7/23 fentanyl  discontinued ketamine  decreased to  0.3 mg/kg/hr, clonazepam  discontinued with ketamine  being at subdissociation dosing, Lasix  continued.  Extubated  Interim History / Subjective:  Has tolerated extubation well Did wear BiPAP for several hours last night and tolerated well Pain well controlled No complaints  Objective    Blood pressure 106/67, pulse 74, temperature 98.1 F (36.7 C), temperature source Oral, resp. rate 18, height 5' 11 (1.803 m), weight (!) 162.2 kg, SpO2 99%.    Vent Mode: Spontaneous FiO2 (%):  [40 %-50 %] 40 % PEEP:  [5 cmH20] 5 cmH20 Pressure Support:  [8 cmH20] 8 cmH20   Intake/Output Summary (Last 24 hours) at 11/05/2023 0738 Last data filed at 11/05/2023 9347 Gross per 24 hour  Intake 2842.84 ml  Output 4700 ml  Net -1857.16 ml   Filed Weights   11/03/23 0500 11/04/23 0332 11/05/23 0500  Weight: (!) 169.8 kg (!) 165.4 kg (!) 162.2 kg    Examination: General: middle aged male in NAD HEENT: [place]/AT, PERRL, no JVD Pulmonary: unlabored. 4L [place]. Diminished bases Cardiac RRR, no MRG Abdomen Soft, NT. Erythema R pannus fold.  Extremities trace to 1+ non-pitting edema Neuro alert, oriented, non-focal  Resolved problem list  Drug related hypotension resolved Assessment and Plan   Acute metabolic encephalopathy secondary to sepsis, and acute on chronic hypercarbic respiratory failure,  Status post fall (PTA), has h/o chronic pain w/ intrathecal pain pump Clearly baseline CO2 retention, with acute on chronic hypercarbic failure driving his encephalopathy as well.  This seems much improved. Plan Will discontinue ketamine  infusion this morning Will check with pharmacy regarding Continue bowel  regimen He does have his intrathecal fentanyl  pump for baseline pain management  Acute on chronic hypercarbic and hypoxic respiratory failure secondary to decompensated obstructive sleep apnea with obesity hypoventilation syndrome, presumed group 3 pulmonary hypertension, and decompensated diastolic heart  failure -baseline CO2 calculates to ~ 70.  Failure is felt to be multifactorial. Narcotics, possible aspiration, overload. I think his chronic pain needs definitely contributed.  - He has tolerated diuresis well.  He is now down 3.4 L,  Plan Continue to push Lasix  and spironolactone  as long as BUN/creatinine and blood pressure will allow Day 4 Unasyn  (Rx for total of 5d) f/u pending resp cultures, but nothing to date At bedtime BiPAP. Would qualify for home BiPAP most likely, but there have been compliance issues with CPAP in the past.  Weight loss recommended.   E. coli UTI with greater than 100,000 colonies Plan Day #8 abx. Changed to unasyn  for asp but also covers this.  Abx stop date placed  Intermittent fluid and Electrolyte imbalance secondary to aggressive diuresis Plan Prophylactically replacing potassium and magnesium   Hyperammonemia, most likely passive congestion related Plan Continuing lactulose  Will repeat ammonia level in the AM  Hyperglycemia Excellent glycemic control  Plan Sliding scale insulin  Goal glucose 140-180  Best Practice (right click and Reselect all SmartList Selections daily)   Diet/type: Regular consistency (see orders) DVT prophylaxis LMWH Pressure ulcer(s): N/A GI prophylaxis: PPI Lines: N/A Foley:  N/A Code Status:  full code Last date of multidisciplinary goals of care discussion []   Critical care time 39 minutes   Deward Eastern, AGACNP-BC [facility]  See Amion for personal pager PCCM on call pager [phone] until 7pm. Please call Elink 7p-7a. [phone]  11/05/2023 8:07 AM

## 2023-11-05 NOTE — Plan of Care (Signed)

## 2023-11-06 DIAGNOSIS — G9341 Metabolic encephalopathy: Secondary | ICD-10-CM | POA: Diagnosis not present

## 2023-11-06 DIAGNOSIS — G4733 Obstructive sleep apnea (adult) (pediatric): Secondary | ICD-10-CM | POA: Diagnosis not present

## 2023-11-06 DIAGNOSIS — E878 Other disorders of electrolyte and fluid balance, not elsewhere classified: Secondary | ICD-10-CM

## 2023-11-06 DIAGNOSIS — J9621 Acute and chronic respiratory failure with hypoxia: Secondary | ICD-10-CM

## 2023-11-06 DIAGNOSIS — J9622 Acute and chronic respiratory failure with hypercapnia: Secondary | ICD-10-CM | POA: Diagnosis not present

## 2023-11-06 DIAGNOSIS — I272 Pulmonary hypertension, unspecified: Secondary | ICD-10-CM

## 2023-11-06 DIAGNOSIS — R197 Diarrhea, unspecified: Secondary | ICD-10-CM

## 2023-11-06 LAB — BASIC METABOLIC PANEL WITH GFR
Anion gap: 15 (ref 5–15)
BUN: 11 mg/dL (ref 6–20)
CO2: 37 mmol/L — ABNORMAL HIGH (ref 22–32)
Calcium: 9 mg/dL (ref 8.9–10.3)
Chloride: 85 mmol/L — ABNORMAL LOW (ref 98–111)
Creatinine, Ser: 0.77 mg/dL (ref 0.61–1.24)
GFR, Estimated: >60 mL/min (ref >60)
Glucose, Bld: 135 mg/dL — ABNORMAL HIGH (ref 70–99)
Potassium: 3.4 mmol/L — ABNORMAL LOW (ref 3.5–5.1)
Sodium: 137 mmol/L (ref 135–145)

## 2023-11-06 LAB — CBC
HCT: 39.1 % (ref 39.0–52.0)
Hemoglobin: 11.6 g/dL — ABNORMAL LOW (ref 13.0–17.0)
MCH: 27.7 pg (ref 26.0–34.0)
MCHC: 29.7 g/dL — ABNORMAL LOW (ref 30.0–36.0)
MCV: 93.3 fL (ref 80.0–100.0)
Platelets: 268 K/uL (ref 150–400)
RBC: 4.19 MIL/uL — ABNORMAL LOW (ref 4.22–5.81)
RDW: 13.6 % (ref 11.5–15.5)
WBC: 6.8 K/uL (ref 4.0–10.5)
nRBC: 0 % (ref 0.0–0.2)

## 2023-11-06 LAB — AMMONIA: Ammonia: 32 umol/L (ref 9–35)

## 2023-11-06 LAB — PHOSPHORUS: Phosphorus: 3.4 mg/dL (ref 2.5–4.6)

## 2023-11-06 LAB — MAGNESIUM: Magnesium: 2.4 mg/dL (ref 1.7–2.4)

## 2023-11-06 MED ORDER — FENTANYL CITRATE PF 50 MCG/ML IJ SOSY
25.0000 ug | PREFILLED_SYRINGE | INTRAMUSCULAR | Status: DC | PRN
  Administered 2023-11-06 – 2023-11-09 (×6): 25 ug via INTRAVENOUS
  Filled 2023-11-06 (×6): qty 1

## 2023-11-06 MED ORDER — ACETAMINOPHEN 325 MG PO TABS: 650.0000 mg | ORAL_TABLET | Freq: Four times a day (QID) | ORAL | Status: DC | PRN

## 2023-11-06 MED ORDER — POTASSIUM CHLORIDE CRYS ER 20 MEQ PO TBCR
40.0000 meq | EXTENDED_RELEASE_TABLET | Freq: Four times a day (QID) | ORAL | Status: AC
  Administered 2023-11-06 – 2023-11-07 (×2): 40 meq via ORAL
  Filled 2023-11-06 (×2): qty 2

## 2023-11-06 NOTE — TOC Progression Note (Signed)
 Transition of Care [facility]) - Progression Note    Patient Details  Name: TALOR CHEEMA MRN: [id number] Date of Birth: July 24, 1965  [facility] CM/SW Contact  Jon ONEIDA Anon, RN Phone Number: 11/06/2023, 3:42 PM  Clinical Narrative:    PT/OT consulted, awaiting any new recommendations. NCM received consult for home ventilator, and sent referral to [facility] with [employer] [facility]. [employer] [facility] working on order for ventilator. Care Management continuing to follow.     Expected Discharge Plan: Home w Home Health Services Barriers to Discharge: Continued Medical Work up               Expected Discharge Plan and Services In-house Referral: NA Discharge Planning Services: CM Consult Post Acute Care Choice: Home Health Living arrangements for the past 2 months: Single Family Home                 DME Arranged: NIV DME Agency: [employer] Date DME Agency Contacted: 11/06/23 Time DME Agency Contacted: [phone] Representative spoke with at DME Agency: Mitch HH Arranged: NA HH Agency: NA         Social Drivers of Health (SDOH) Interventions SDOH Screenings   Food Insecurity: No Food Insecurity (08/12/2022)   Received from [facility]  Housing: Low Risk  (04/01/2022)  Transportation Needs: No Transportation Needs (08/12/2022)   Received from [facility]  Utilities: Not At Risk (08/12/2022)   Received from [facility]  Depression (PHQ2-9): Low Risk  (03/19/2021)  Financial Resource Strain: Medium Risk (04/18/2022)   Received from [facility]  Physical Activity: Unknown (04/18/2022)   Received from [facility]  Recent Concern: Physical Activity - Inactive (04/18/2022)   Received from [facility]  Social Connections: Socially Isolated (04/18/2022)   Received from [facility]  Stress: No Stress Concern Present (11/05/2022)   Received from [facility]  Tobacco Use: High Risk (10/29/2023)    Readmission Risk Interventions    11/02/2023   10:31 AM 08/25/2022    11:46 AM  Readmission Risk Prevention Plan  Transportation Screening Complete Complete  PCP or Specialist Appt within 5-7 Days Complete   PCP or Specialist Appt within 3-5 Days  Complete  Home Care Screening Complete   Medication Review (RN CM) Complete   HRI or Home Care Consult  Complete  Social Work Consult for Recovery Care Planning/Counseling  Complete  Palliative Care Screening  Not Applicable  Medication Review [employer])  Complete

## 2023-11-06 NOTE — Progress Notes (Addendum)
 NAME:  [name], MRN:  [id number], DOB:  11/16/1965, LOS: 7 ADMISSION DATE:  10/29/2023, CONSULTATION DATE:  10/30/2023 REFERRING MD: Nettie Earing, MD, CHIEF COMPLAINT:  AMS for 2 days   History of Present Illness:  A 58 yr old male patient with OSA/OHS, morbid obesity, D-CHF, and GERD who had UTI and sepsis before leading to AMS per his wife. He has AMS for 2 days, he was found by EMS laying in his own urine and feces from 2 days. He has home aid that came yesterday but she wouldn't let her clean him up. He apparently fell out of his powerchair. Currently, he is altered on BiPAP and he cannot provide hx.   Pertinent  Medical History  OSA/OHS, morbid obesity, D-CHF, GERD, UTI sepsis   Significant Hospital Events: Including procedures, antibiotic start and stop dates in addition to other pertinent events   7/18: admit to ICU. Given 1.5 L LR, Vanco, Flagyl , and Cefepime  in ED.   7/20 antibiotics continued, getting a lactulose , cultures negative, interactive, requiring increased stimulation overnight in order to respond 7/21 had requested pain medication and received this around 1 AM, received Percocet.  Requiring sternal rub to respond.  Noted to have snuff in his mouth, with episode of desaturation noted by nursing staff.  Barely responsive in the morning on 7/21 arterial blood gas obtained On BiPAP pH is 7.18 PCO2 of greater than 123, O2 94 bicarbonate 50.  Daytime team contacted on arrival patient on BiPAP, he will open his eyes, he can follow commands but has bilateral lateral Asterix.  Rapidly falls back asleep, intubated, ABX changed to Unasyn . Started aggressive diuresis. Radial a line placed.  7/22 some bradycardia during evening.  Bowel movement achieved after bowel regimen initiated.  Started ketamine  infusion at 0.5 mg/kg/h, required low-dose clonazepam  with concern about hallucination.  Continued aggressive diuresis, weaning oxygen  7/23 fentanyl  discontinued ketamine  decreased to  0.3 mg/kg/hr, clonazepam  discontinued with ketamine  being at subdissociation dosing, Lasix  continued.  Extubated  Interim History / Subjective:  Doing OK with nocturnal BiPAP Feeling better Still some lightheadedness Diarrhea becoming an issue  Objective    Blood pressure 134/68, pulse 63, temperature 97.9 F (36.6 C), temperature source Oral, resp. rate 13, height 5' 11 (1.803 m), weight (!) 163 kg, SpO2 96%.    FiO2 (%):  [40 %] 40 % PEEP:  [6 cmH20] 6 cmH20   Intake/Output Summary (Last 24 hours) at 11/06/2023 0818 Last data filed at 11/06/2023 0600 Gross per 24 hour  Intake 1839.4 ml  Output 2200 ml  Net -360.6 ml   Filed Weights   11/04/23 0332 11/05/23 0500 11/06/23 0500  Weight: (!) 165.4 kg (!) 162.2 kg (!) 163 kg    Examination: General: adult male in NAD obese HEENT: [place]/AT, PERRL, no JVD Pulmonary: clear bilateral breath sounds, unlabored Cardiac RRR, no MRG Abdomen Soft, NT, ND Extremities no acute deformity Neuro alert, oriented, non-focal  Resolved problem list    Assessment and Plan   Acute metabolic encephalopathy secondary to sepsis, and acute on chronic hypercarbic respiratory failure,  Status post fall (PTA), has h/o chronic pain w/ intrathecal pain pump Clearly baseline CO2 retention, with acute on chronic hypercarbic failure driving his encephalopathy as well.  This seems much improved. Plan Much improved He does have his intrathecal fentanyl  pump for baseline pain management PT/OT eval pending OOB today hopefully to chair (doesn't walk at baseline due to back pain)  Acute on chronic hypercarbic and hypoxic respiratory failure  secondary to decompensated obstructive sleep apnea with obesity hypoventilation syndrome, presumed group 3 pulmonary hypertension, and decompensated diastolic heart failure -baseline CO2 calculates to ~ 70.  Failure is felt to be multifactorial. Narcotics, possible aspiration, overload. I think his chronic pain needs  definitely contributed.  - He has tolerated diuresis well.  He is now down 4.2 L overall Plan Diuretics decrased to Lasix  40mg  BID Final day of unasyn  At bedtime BiPAP. Orders placed for home vent and TOC consulted Weight loss recommended.   E. coli UTI with greater than 100,000 colonies Plan Last day ABX today  Intermittent fluid and Electrolyte imbalance secondary to aggressive diuresis Plan Labs today pending  Hyperammonemia, most likely passive congestion related Plan DC lactulose   Hyperglycemia Excellent glycemic control  Plan Sliding scale insulin  Goal glucose 140-180  Diarrhea - cut back bowel reg  Best Practice (right click and Reselect all SmartList Selections daily)   Diet/type: Regular consistency (see orders) DVT prophylaxis LMWH Pressure ulcer(s): N/A GI prophylaxis: PPI Lines: N/A Foley:  N/A Code Status:  full code Last date of multidisciplinary goals of care discussion []     Deward Eastern, AGACNP-BC [facility]  See Amion for personal pager PCCM on call pager [phone] until 7pm. Please call Elink 7p-7a. [phone]  11/06/2023 8:18 AM

## 2023-11-07 DIAGNOSIS — J9622 Acute and chronic respiratory failure with hypercapnia: Secondary | ICD-10-CM | POA: Diagnosis not present

## 2023-11-07 DIAGNOSIS — R6520 Severe sepsis without septic shock: Secondary | ICD-10-CM

## 2023-11-07 DIAGNOSIS — A419 Sepsis, unspecified organism: Secondary | ICD-10-CM | POA: Diagnosis not present

## 2023-11-07 DIAGNOSIS — G9341 Metabolic encephalopathy: Secondary | ICD-10-CM | POA: Diagnosis not present

## 2023-11-07 LAB — CBC
HCT: 37.9 % — ABNORMAL LOW (ref 39.0–52.0)
HCT: 39.4 % (ref 39.0–52.0)
Hemoglobin: 11.1 g/dL — ABNORMAL LOW (ref 13.0–17.0)
Hemoglobin: 11.4 g/dL — ABNORMAL LOW (ref 13.0–17.0)
MCH: 27.5 pg (ref 26.0–34.0)
MCH: 27.2 pg (ref 26.0–34.0)
MCHC: 29.3 g/dL — ABNORMAL LOW (ref 30.0–36.0)
MCHC: 28.9 g/dL — ABNORMAL LOW (ref 30.0–36.0)
MCV: 94 fL (ref 80.0–100.0)
MCV: 94 fL (ref 80.0–100.0)
Platelets: 286 K/uL (ref 150–400)
Platelets: 310 K/uL (ref 150–400)
RBC: 4.03 MIL/uL — ABNORMAL LOW (ref 4.22–5.81)
RBC: 4.19 MIL/uL — ABNORMAL LOW (ref 4.22–5.81)
RDW: 13.3 % (ref 11.5–15.5)
RDW: 13.4 % (ref 11.5–15.5)
WBC: 6.3 K/uL (ref 4.0–10.5)
WBC: 7.1 K/uL (ref 4.0–10.5)
nRBC: 0 % (ref 0.0–0.2)
nRBC: 0 % (ref 0.0–0.2)

## 2023-11-07 LAB — BASIC METABOLIC PANEL WITH GFR
Anion gap: 14 (ref 5–15)
BUN: 11 mg/dL (ref 6–20)
CO2: 38 mmol/L — ABNORMAL HIGH (ref 22–32)
Calcium: 8.8 mg/dL — ABNORMAL LOW (ref 8.9–10.3)
Chloride: 84 mmol/L — ABNORMAL LOW (ref 98–111)
Creatinine, Ser: 0.62 mg/dL (ref 0.61–1.24)
GFR, Estimated: >60 mL/min (ref >60)
Glucose, Bld: 120 mg/dL — ABNORMAL HIGH (ref 70–99)
Potassium: 3.7 mmol/L (ref 3.5–5.1)
Sodium: 136 mmol/L (ref 135–145)

## 2023-11-07 LAB — PHOSPHORUS: Phosphorus: 3 mg/dL (ref 2.5–4.6)

## 2023-11-07 LAB — MAGNESIUM: Magnesium: 2.1 mg/dL (ref 1.7–2.4)

## 2023-11-07 NOTE — Progress Notes (Signed)
 PROGRESS NOTE    [name]  FMW:[id number] DOB: 1965/06/17 DOA: 10/29/2023 PCP: Seabron Lenis, MD   Brief Narrative:  A 58 yr old male patient with OSA/OHS, morbid obesity, D-CHF, and GERD who had UTI and sepsis before leading to AMS per his wife. He has AMS for 2 days, he was found by EMS laying in his own urine and feces from 2 days. He has home aid that came yesterday but he wouldn't let her clean him up. He apparently fell out of his powerchair - initially unable to corroborate history due to mental status changes.  Patient initially admitted to the hospitalist group but transferred to ICU given profound respiratory distress, hypoxia, and concern for worsening sepsis -ultimately ended up requiring intubation for respiratory support.  At this time patient is now extubated, transition back to hospitalist team for further evaluation treatment and discharge planning.  Will require home ventilator prior to discharge, per insurance he requires new updated PFTs which can only be done at main campus during the week.  As such patient remain inpatient over the weekend to continue supportive care and physical therapy until PFTs can be completed and home ventilator can be provided.  Hospital course 7/18: admit to ICU. Given 1.5 L LR, Vanco, Flagyl , and Cefepime  in ED.   7/20 antibiotics continued, getting a lactulose , cultures negative, interactive, requiring increased stimulation overnight in order to respond 7/21 had requested pain medication and received this around 1 AM, received Percocet.  Requiring sternal rub to respond.  Noted to have snuff in his mouth, with episode of desaturation noted by nursing staff.  Barely responsive in the morning on 7/21 arterial blood gas obtained On BiPAP pH is 7.18 PCO2 of greater than 123, O2 94 bicarbonate 50.  Daytime team contacted on arrival patient on BiPAP, he will open his eyes, he can follow commands but has bilateral lateral Asterix.  Rapidly falls back  asleep, intubated, ABX changed to Unasyn . Started aggressive diuresis. Radial a line placed.  7/22 some bradycardia during evening.  Bowel movement achieved after bowel regimen initiated.  Started ketamine  infusion at 0.5 mg/kg/h, required low-dose clonazepam  with concern about hallucination.  Continued aggressive diuresis, weaning oxygen  7/23 Extubated successfully without incident 7/26 transfer back to [facility] -awaiting PFTs for ventilator approval and discharge likely back home  Assessment & Plan:   Principal Problem:   Sepsis (HCC) Active Problems:   Acute on chronic respiratory failure with hypercapnia (HCC)   Hypercapnia  Acute metabolic encephalopathy secondary to sepsis, and acute on chronic hypercarbic respiratory failure,  Status post fall (PTA), has h/o chronic pain w/ intrathecal pain pump -Resolved, appears to be back to baseline - PT/OT eval pending - OOB today hopefully to chair (doesn't walk at baseline due to back pain) -Pain pump ongoing, to be managed outpatient by pain clinic next week   Acute on chronic hypercarbic and hypoxic respiratory failure secondary to decompensated obstructive sleep apnea with obesity hypoventilation syndrome, presumed group 3 pulmonary hypertension, and decompensated diastolic heart failure - Markedly hypercarbic given ABG with pH of 7.18 prior to intubation - Patient does have chronic hypercarbia given compensation with CO2 around 70 per history - Complicated by narcotic use, obesity and sleep apnea - Improving with diuretics, wean as appropriate  - Prior antibiotics completed - At bedtime BiPAP. Orders placed for home vent and TOC consulted   E. coli UTI with greater than 100,000 colonies Completed antibiotics 11/06/2023   Intermittent fluid and Electrolyte imbalance secondary to aggressive  diuresis Resolving, follow repeat labs   Hyperammonemia, most likely passive congestion related Off lactulose , follow clinically   Hyperglycemia  without history of diabetes - Glucose well-controlled, continue to advance diet as tolerated   Diarrhea - Possibly iatrogenic, decrease as needed medications, lactulose  discontinued as above  DVT prophylaxis: enoxaparin  (LOVENOX ) injection 30 mg Start: 10/30/23 1000 Code Status:   Code Status: Full Code Family Communication: None present  Status is: Inpatient  Dispo: The patient is from: Home              Anticipated d/c is to: Home              Anticipated d/c date is: 24 to 48 hours              Patient currently not medically stable for discharge  Consultants:  PCCM  Antimicrobials:  Completed  Subjective: No acute issues or events overnight denies nausea vomiting diarrhea constipation headache fevers chills or chest pain  Objective: Vitals:   11/07/23 0400 11/07/23 0417 11/07/23 0500 11/07/23 0600  BP: 120/64  114/67 110/60  Pulse: 61 (!) 59 (!) 57 (!) 58  Resp: 13 13 11 11   Temp:  98.6 F (37 C)    TempSrc:  Axillary    SpO2: 94% 95% 97% 94%  Weight:  (!) 164.2 kg    Height:        Intake/Output Summary (Last 24 hours) at 11/07/2023 0811 Last data filed at 11/07/2023 0600 Gross per 24 hour  Intake 760.06 ml  Output 3450 ml  Net -2689.94 ml   Filed Weights   11/05/23 0500 11/06/23 0500 11/07/23 0417  Weight: (!) 162.2 kg (!) 163 kg (!) 164.2 kg    Examination:  General:  Pleasantly resting in bed, No acute distress. Lungs: Distant lung sounds throughout without overt wheeze rhonchi or rales Heart:  Regular rate and rhythm.  Without murmurs, rubs, or gallops. Abdomen:  Soft, obese, nontender, nondistended.  Without guarding or rebound. Extremities: Without cyanosis, clubbing, or obvious deformity.  Data Reviewed: I have personally reviewed following labs and imaging studies  CBC: Recent Labs  Lab 11/02/23 0255 11/02/23 0907 11/03/23 0430 11/04/23 0306 11/06/23 0308 11/07/23 0259  WBC 8.7  --  8.8 8.2 6.8 6.3  HGB 11.3* 13.3 10.2* 10.8* 11.6*  11.1*  HCT 40.2 39.0 34.7* 38.3* 39.1 37.9*  MCV 100.0  --  95.1 95.8 93.3 94.0  PLT 260  --  280 279 268 286   Basic Metabolic Panel: Recent Labs  Lab 11/02/23 1942 11/03/23 0430 11/04/23 0306 11/04/23 1727 11/05/23 0312 11/06/23 0308 11/06/23 1011 11/07/23 0259  NA  --  137 142 141  --   --  137 136  K  --  3.1* 3.8 4.4  --   --  3.4* 3.7  CL  --  88* 90* 89*  --   --  85* 84*  CO2  --  36* 37* 39*  --   --  37* 38*  GLUCOSE  --  134* 130* 142*  --   --  135* 120*  BUN  --  20 18 14   --   --  11 11  CREATININE  --  0.69 0.71 0.66  --   --  0.77 0.62  CALCIUM   --  8.3* 8.6* 8.9  --   --  9.0 8.8*  MG 2.0  --  1.9  --  2.3 2.4  --  2.1  PHOS <1.0* 3.0 3.9  --  3.6 3.4  --  3.0   GFR: Estimated Creatinine Clearance: 159.8 mL/min (by C-G formula based on SCr of 0.62 mg/dL). Liver Function Tests: No results for input(s): AST, ALT, ALKPHOS, BILITOT, PROT, ALBUMIN in the last 168 hours. No results for input(s): LIPASE, AMYLASE in the last 168 hours. Recent Labs  Lab 11/02/23 0255 11/06/23 0308  AMMONIA 51* 32   Coagulation Profile: No results for input(s): INR, PROTIME in the last 168 hours. Cardiac Enzymes: No results for input(s): CKTOTAL, CKMB, CKMBINDEX, TROPONINI in the last 168 hours. BNP (last 3 results) No results for input(s): PROBNP in the last 8760 hours. HbA1C: No results for input(s): HGBA1C in the last 72 hours. CBG: Recent Labs  Lab 11/03/23 2356 11/04/23 0402 11/04/23 0801 11/04/23 1219 11/04/23 1543  GLUCAP 112* 114* 129* 112* 110*   Lipid Profile: No results for input(s): CHOL, HDL, LDLCALC, TRIG, CHOLHDL, LDLDIRECT in the last 72 hours. Thyroid  Function Tests: No results for input(s): TSH, T4TOTAL, FREET4, T3FREE, THYROIDAB in the last 72 hours. Anemia Panel: No results for input(s): VITAMINB12, FOLATE, FERRITIN, TIBC, IRON, RETICCTPCT in the last 72 hours. Sepsis  Labs: Recent Labs  Lab 11/02/23 0255 11/03/23 0430 11/04/23 0306 11/05/23 0312  PROCALCITON <0.10 <0.10 <0.10 <0.10    Recent Results (from the past 240 hours)  Blood Culture (routine x 2)     Status: None   Collection Time: 10/30/23 12:00 AM   Specimen: BLOOD RIGHT FOREARM  Result Value Ref Range Status   Specimen Description   Final    BLOOD RIGHT FOREARM Performed at [facility], [address] [place], [place] [address]    Special Requests   Final    BOTTLES DRAWN AEROBIC AND ANAEROBIC Blood Culture adequate volume Performed at [facility], [address] [place], [place] [address]    Culture   Final    NO GROWTH 5 DAYS Performed at [facility], [address], [place], [place] [address]    Report Status 11/04/2023 FINAL  Final  Urine Culture     Status: Abnormal   Collection Time: 10/30/23 12:52 AM   Specimen: Urine, Random  Result Value Ref Range Status   Specimen Description   Final    URINE, RANDOM Performed at [facility], [address] [place], [place] [address]    Special Requests   Final    NONE Reflexed from [id number] Performed at [facility], [address], [place], [place] [address]    Culture >=100,000 COLONIES/mL ESCHERICHIA COLI (A)  Final   Report Status 11/02/2023 FINAL  Final   Organism ID, Bacteria ESCHERICHIA COLI (A)  Final      Susceptibility   Escherichia coli - MIC*    AMPICILLIN  <=2 SENSITIVE Sensitive     CEFAZOLIN  <=4 SENSITIVE Sensitive     CEFEPIME  <=0.12 SENSITIVE Sensitive     CEFTRIAXONE  <=0.25 SENSITIVE Sensitive     CIPROFLOXACIN <=0.25 SENSITIVE Sensitive     GENTAMICIN <=1 SENSITIVE Sensitive     IMIPENEM <=0.25 SENSITIVE Sensitive     NITROFURANTOIN <=16 SENSITIVE Sensitive     TRIMETH/SULFA <=20 SENSITIVE Sensitive     AMPICILLIN /SULBACTAM <=2 SENSITIVE Sensitive     PIP/TAZO <=4 SENSITIVE Sensitive ug/mL    * >=100,000 COLONIES/mL ESCHERICHIA  COLI  Resp panel by RT-PCR (RSV, Flu A&B, Covid) Anterior Nasal Swab     Status: None   Collection Time: 10/30/23  1:00 AM   Specimen: Anterior Nasal  Swab  Result Value Ref Range Status   SARS Coronavirus 2 by RT PCR NEGATIVE NEGATIVE Final    Comment: (NOTE) SARS-CoV-2 target nucleic acids are NOT DETECTED.  The SARS-CoV-2 RNA is generally detectable in upper respiratory specimens during the acute phase of infection. The lowest concentration of SARS-CoV-2 viral copies this assay can detect is 138 copies/mL. A negative result does not preclude SARS-Cov-2 infection and should not be used as the sole basis for treatment or other patient management decisions. A negative result may occur with  improper specimen collection/handling, submission of specimen other than nasopharyngeal swab, presence of viral mutation(s) within the areas targeted by this assay, and inadequate number of viral copies(<138 copies/mL). A negative result must be combined with clinical observations, patient history, and epidemiological information. The expected result is Negative.  Fact Sheet for Patients:  BloggerCourse.com  Fact Sheet for Healthcare Providers:  SeriousBroker.it  This test is no t yet approved or cleared by the [place]  FDA and  has been authorized for detection and/or diagnosis of SARS-CoV-2 by FDA under an Emergency Use Authorization (EUA). This EUA will remain  in effect (meaning this test can be used) for the duration of the COVID-19 declaration under Section 564(b)(1) of the Act, 21 U.S.C.section 360bbb-3(b)(1), unless the authorization is terminated  or revoked sooner.       Influenza A by PCR NEGATIVE NEGATIVE Final   Influenza B by PCR NEGATIVE NEGATIVE Final    Comment: (NOTE) The Xpert Xpress SARS-CoV-2/FLU/RSV plus assay is intended as an aid in the diagnosis of influenza from Nasopharyngeal swab specimens and should not be  used as a sole basis for treatment. Nasal washings and aspirates are unacceptable for Xpert Xpress SARS-CoV-2/FLU/RSV testing.  Fact Sheet for Patients: BloggerCourse.com  Fact Sheet for Healthcare Providers: SeriousBroker.it  This test is not yet approved or cleared by the [place]  FDA and has been authorized for detection and/or diagnosis of SARS-CoV-2 by FDA under an Emergency Use Authorization (EUA). This EUA will remain in effect (meaning this test can be used) for the duration of the COVID-19 declaration under Section 564(b)(1) of the Act, 21 U.S.C. section 360bbb-3(b)(1), unless the authorization is terminated or revoked.     Resp Syncytial Virus by PCR NEGATIVE NEGATIVE Final    Comment: (NOTE) Fact Sheet for Patients: BloggerCourse.com  Fact Sheet for Healthcare Providers: SeriousBroker.it  This test is not yet approved or cleared by the [place]  FDA and has been authorized for detection and/or diagnosis of SARS-CoV-2 by FDA under an Emergency Use Authorization (EUA). This EUA will remain in effect (meaning this test can be used) for the duration of the COVID-19 declaration under Section 564(b)(1) of the Act, 21 U.S.C. section 360bbb-3(b)(1), unless the authorization is terminated or revoked.  Performed at [facility], [address] [place], [place] [address]   Blood Culture (routine x 2)     Status: None   Collection Time: 10/30/23  1:00 AM   Specimen: BLOOD LEFT ARM  Result Value Ref Range Status   Specimen Description   Final    BLOOD LEFT ARM Performed at [facility], [address], [place], [place] [address]    Special Requests   Final    BOTTLES DRAWN AEROBIC AND ANAEROBIC Blood Culture results may not be optimal due to an inadequate volume of blood received in culture bottles Performed at [facility], [address] [place], [place] [address]    Culture  Final    NO GROWTH 5 DAYS Performed at [facility], [address], [place], [place] [address]    Report Status 11/04/2023 FINAL  Final  MRSA Next Gen by PCR, Nasal     Status: None   Collection Time: 10/30/23  5:50 AM   Specimen: Nasal Mucosa; Nasal Swab  Result Value Ref Range Status   MRSA by PCR Next Gen NOT DETECTED NOT DETECTED Final    Comment: (NOTE) The GeneXpert MRSA Assay (FDA approved for NASAL specimens only), is one component of a comprehensive MRSA colonization surveillance program. It is not intended to diagnose MRSA infection nor to guide or monitor treatment for MRSA infections. Test performance is not FDA approved in patients less than 19 years old. Performed at [facility], [address], [place], [place] [address]   Culture, Respiratory w Gram Stain     Status: None   Collection Time: 11/02/23  9:18 AM   Specimen: Tracheal Aspirate; Respiratory  Result Value Ref Range Status   Specimen Description   Final    TRACHEAL ASPIRATE Performed at [facility], [address] [place], [place] [address]    Special Requests   Final    NONE Performed at [facility], [address] [place], [place] [address]    Gram Stain NO WBC SEEN NO ORGANISMS SEEN   Final   Culture   Final    Normal respiratory flora-no Staph aureus or Pseudomonas seen Performed at [facility], [address] [place], [place] [address]    Report Status 11/04/2023 FINAL  Final    Radiology Studies: No results found.  Scheduled Meds:  Chlorhexidine  Gluconate Cloth  6 each Topical Daily   DULoxetine   90 mg Oral Daily   enoxaparin  (LOVENOX ) injection  30 mg Subcutaneous Q12H   furosemide   40 mg Intravenous BID   nystatin    Topical TID   pantoprazole   40 mg Oral Daily   polyethylene glycol  17 g Oral Daily   Continuous Infusions:  Fentany  Intrathecal pump 1300 mcg/day ( 2000 mcg/ml x 40ml)       LOS: 8 days   Time spent:  Brian JAYSON Montclair, DO [facility]  If 7PM-7AM, please contact night-coverage [email or web address]  11/07/2023, 8:11 AM

## 2023-11-07 NOTE — Evaluation (Signed)
 Physical Therapy Evaluation Patient Details Name: [name] MRN: [id number] DOB: [birth year] Today's Date: 11/07/2023  History of Present Illness  58 yr old male patient  for   AMS for 2 days, he was found by EMS laying in his own urine and feces from 2 days. He apparently fell out of his powerchair, transferred to ICU for profound respiratory distress, hypoxia, and concern for worsening sepsis -ultimately ended up requiring intubation for respiratory support , Extubated 11/04/23. -PMH: chronic pain management, intrathecal pump , DMII, morbid obesity, OSA/OHS, D-CHF, GERD  Clinical Impression  Pt admitted with above diagnosis.  Pt currently with functional limitations due to the deficits listed below (see PT Problem List). Pt will benefit from acute skilled PT to increase their independence and safety with mobility to allow discharge.    The patient is eager to mobilize, required +2 mod assist to sit upright and max  assist of 2 to return to supine.  Patient  did attempt standing x 4 trials  but unable to safely stand.   Patient maintained on 4 L [place], spo2 96% .  Patient is nonambulatory at  baseline, transfers with 1 person assistance , uses power WC for mobility.   Patient will benefit from continued inpatient follow up therapy, <3 hours/day       If plan is discharge home, recommend the following: Two people to help with walking and/or transfers;Two people to help with bathing/dressing/bathroom;Direct supervision/assist for financial management;Assist for transportation;Help with stairs or ramp for entrance   Can travel by private vehicle   No    Equipment Recommendations None recommended by PT  Recommendations for Other Services       Functional Status Assessment Patient has had a recent decline in their functional status and demonstrates the ability to make significant improvements in function in a reasonable and predictable amount of time.     Precautions / Restrictions  Precautions Precautions: Fall Restrictions Weight Bearing Restrictions Per Provider Order: No      Mobility  Bed Mobility Overal bed mobility: Needs Assistance Bed Mobility: Rolling, Sidelying to Sit, Sit to Supine Rolling: Mod assist, +2 for physical assistance, +2 for safety/equipment, Used rails Sidelying to sit: +2 for safety/equipment, +2 for physical assistance, Mod assist   Sit to supine: Max assist, +2 for safety/equipment, +2 for physical assistance   General bed mobility comments: patient able to roll rto right side and with momentum,  sit upright with mod assist of  2 . max  +2 to return to supine, body habitus effects require more assistance    Transfers Overall transfer level: Needs assistance Equipment used: Rolling walker (2 wheels) Transfers: Sit to/from Stand Sit to Stand: Max assist, +2 safety/equipment, +2 physical assistance, From elevated surface           General transfer comment: attempted x 4 to stand from bed, did clear buttocks x 1    Ambulation/Gait                  Stairs            Wheelchair Mobility     Tilt Bed    Modified Rankin (Stroke Patients Only)       Balance Overall balance assessment: Needs assistance, History of Falls Sitting-balance support: Bilateral upper extremity supported, Feet supported Sitting balance-Leahy Scale: Fair     Standing balance support: Bilateral upper extremity supported, During functional activity, Reliant on assistive device for balance Standing balance-Leahy Scale: Poor  Pertinent Vitals/Pain Pain Assessment Pain Assessment: Faces Faces Pain Scale: Hurts worst Pain Location: back with mobility Pain Descriptors / Indicators: Contraction, Discomfort, Guarding, Grimacing, Moaning Pain Intervention(s): Limited activity within patient's tolerance, Monitored during session, Premedicated before session    Home Living Family/patient expects  to be discharged to:: Private residence Living Arrangements: Spouse/significant other Available Help at Discharge: Family Type of Home: House Home Access: Ramped entrance       Home Layout: One level Home Equipment: Wheelchair - power;Shower seat;Grab bars - toilet;Hospital bed      Prior Function Prior Level of Function : Needs assist       Physical Assist : Mobility (physical)     Mobility Comments: stand pivot with Rw  for trnasfers with assist, wife unable to physically assist.       Extremity/Trunk Assessment        Lower Extremity Assessment Lower Extremity Assessment: Generalized weakness    Cervical / Trunk Assessment Cervical / Trunk Assessment: Back Surgery;Other exceptions Cervical / Trunk Exceptions: body habitus  Communication   Communication Communication: No apparent difficulties    Cognition Arousal: Alert Behavior During Therapy: WFL for tasks assessed/performed   PT - Cognitive impairments: No apparent impairments                         Following commands: Intact       Cueing       General Comments      Exercises     Assessment/Plan    PT Assessment Patient needs continued PT services  PT Problem List Decreased strength;Decreased mobility;Cardiopulmonary status limiting activity;Pain;Obesity;Impaired sensation;Decreased knowledge of use of DME;Decreased balance;Decreased range of motion       PT Treatment Interventions DME instruction;Therapeutic activities;Functional mobility training;Therapeutic exercise;Patient/family education    PT Goals (Current goals can be found in the Care Plan section)  Acute Rehab PT Goals Patient Stated Goal: get up PT Goal Formulation: With patient Time For Goal Achievement: 11/21/23 Potential to Achieve Goals: Fair    Frequency Min 2X/week     Co-evaluation               AM-PAC PT 6 Clicks Mobility  Outcome Measure Help needed turning from your back to your side while  in a flat bed without using bedrails?: A Lot Help needed moving from lying on your back to sitting on the side of a flat bed without using bedrails?: A Lot Help needed moving to and from a bed to a chair (including a wheelchair)?: Total Help needed standing up from a chair using your arms (e.g., wheelchair or bedside chair)?: Total Help needed to walk in hospital room?: Total Help needed climbing 3-5 steps with a railing? : Total 6 Click Score: 8    End of Session Equipment Utilized During Treatment: Gait belt Activity Tolerance: Patient limited by fatigue Patient left: in bed;with call bell/phone within reach;with nursing/sitter in room Nurse Communication: Mobility status;Need for lift equipment PT Visit Diagnosis: Unsteadiness on feet (R26.81);Repeated falls (R29.6);Muscle weakness (generalized) (M62.81);Other symptoms and signs involving the nervous system (R29.898)    Time: 8684-8654 PT Time Calculation (min) (ACUTE ONLY): 30 min   Charges:   PT Evaluation $PT Eval Low Complexity: 1 Low PT Treatments $Therapeutic Activity: 8-22 mins PT General Charges $$ ACUTE PT VISIT: 1 Visit         Darice Potters PT Acute Rehabilitation Services Office [phone]   Potters Darice Norris 11/07/2023, 4:40 PM

## 2023-11-07 NOTE — Plan of Care (Signed)
  Problem: Nutritional: Goal: Maintenance of adequate nutrition will improve Outcome: Progressing   Problem: Tissue Perfusion: Goal: Adequacy of tissue perfusion will improve Outcome: Progressing   Problem: Clinical Measurements: Goal: Ability to maintain clinical measurements within normal limits will improve Outcome: Progressing Goal: Will remain free from infection Outcome: Progressing Goal: Respiratory complications will improve Outcome: Progressing Goal: Cardiovascular complication will be avoided Outcome: Progressing   Problem: Elimination: Goal: Will not experience complications related to bowel motility Outcome: Progressing Goal: Will not experience complications related to urinary retention Outcome: Progressing   Problem: Pain Managment: Goal: General experience of comfort will improve and/or be controlled Outcome: Progressing

## 2023-11-08 DIAGNOSIS — A419 Sepsis, unspecified organism: Secondary | ICD-10-CM | POA: Diagnosis not present

## 2023-11-08 DIAGNOSIS — R6520 Severe sepsis without septic shock: Secondary | ICD-10-CM | POA: Diagnosis not present

## 2023-11-08 DIAGNOSIS — G9341 Metabolic encephalopathy: Secondary | ICD-10-CM | POA: Diagnosis not present

## 2023-11-08 DIAGNOSIS — J9622 Acute and chronic respiratory failure with hypercapnia: Secondary | ICD-10-CM | POA: Diagnosis not present

## 2023-11-08 LAB — COMPREHENSIVE METABOLIC PANEL WITH GFR
ALT: 23 U/L (ref 0–44)
AST: 27 U/L (ref 15–41)
Albumin: 3 g/dL — ABNORMAL LOW (ref 3.5–5.0)
Alkaline Phosphatase: 57 U/L (ref 38–126)
Anion gap: 10 (ref 5–15)
BUN: 13 mg/dL (ref 6–20)
CO2: 38 mmol/L — ABNORMAL HIGH (ref 22–32)
Calcium: 8.6 mg/dL — ABNORMAL LOW (ref 8.9–10.3)
Chloride: 88 mmol/L — ABNORMAL LOW (ref 98–111)
Creatinine, Ser: 0.54 mg/dL — ABNORMAL LOW (ref 0.61–1.24)
GFR, Estimated: >60 mL/min (ref >60)
Glucose, Bld: 120 mg/dL — ABNORMAL HIGH (ref 70–99)
Potassium: 3.4 mmol/L — ABNORMAL LOW (ref 3.5–5.1)
Sodium: 136 mmol/L (ref 135–145)
Total Bilirubin: 0.7 mg/dL (ref 0.0–1.2)
Total Protein: 7.6 g/dL (ref 6.5–8.1)

## 2023-11-08 NOTE — TOC Progression Note (Signed)
 Transition of Care [facility]) - Progression Note    Patient Details  Name: [name] MRN: [id number] Date of Birth: December 24, 1965  Transition of [facility]) CM/SW Contact  Sheri ONEIDA [place], [place] Phone Number: 11/08/2023, 5:07 PM  Clinical Narrative:    CSW met w/ pt to discuss SNF recommendation. Pt declines SNF stating he wishes to return home w/ HH.   Expected Discharge Plan: Home w Home Health Services Barriers to Discharge: Continued Medical Work up               Expected Discharge Plan and Services In-house Referral: NA Discharge Planning Services: CM Consult Post Acute Care Choice: Home Health Living arrangements for the past 2 months: Single Family Home                 DME Arranged: NIV DME Agency: [employer] Date DME Agency Contacted: 11/06/23 Time DME Agency Contacted: [phone] Representative spoke with at DME Agency: Mitch HH Arranged: NA HH Agency: NA         Social Drivers of Health (SDOH) Interventions SDOH Screenings   Food Insecurity: No Food Insecurity (08/12/2022)   Received from [facility]  Housing: Low Risk  (04/01/2022)  Transportation Needs: No Transportation Needs (08/12/2022)   Received from [facility]  Utilities: Not At Risk (08/12/2022)   Received from [facility]  Depression (PHQ2-9): Low Risk  (03/19/2021)  Financial Resource Strain: Medium Risk (04/18/2022)   Received from [facility]  Physical Activity: Unknown (04/18/2022)   Received from [facility]  Recent Concern: Physical Activity - Inactive (04/18/2022)   Received from [facility]  Social Connections: Socially Isolated (04/18/2022)   Received from [facility]  Stress: No Stress Concern Present (11/05/2022)   Received from [facility]  Tobacco Use: High Risk (10/29/2023)    Readmission Risk Interventions    11/02/2023   10:31 AM 08/25/2022   11:46 AM  Readmission Risk Prevention Plan  Transportation Screening Complete Complete  PCP or Specialist Appt within 5-7 Days  Complete   PCP or Specialist Appt within 3-5 Days  Complete  Home Care Screening Complete   Medication Review (RN CM) Complete   HRI or Home Care Consult  Complete  Social Work Consult for Recovery Care Planning/Counseling  Complete  Palliative Care Screening  Not Applicable  Medication Review [employer])  Complete

## 2023-11-08 NOTE — Progress Notes (Signed)
 Occupational Therapy Evaluation Patient Details Name: [name] MRN: [id number] DOB: [birth year] Today's Date: 11/08/2023   History of Present Illness   58 yr old male patient  admitted with AMS for 2 days; he was found by EMS laying in his own urine and feces. He apparently fell out of his powerchair, transferred to ICU for profound respiratory distress, hypoxia, and concern for worsening sepsis -ultimately ended up requiring intubation for respiratory support , Extubated 11/04/23. -PMH: chronic pain management, intrathecal pump , DM II, obesity, OSA/OHS, D-CHF, GERD     Clinical Impressions The pt is currently presenting with the below listed deficits (see OT problem list). As such, his occupational performance is compromised and he requires assist for self-care management. During the session, he required min assist with increased time and effort for supine to sit, max assist for simulated lower body dressing, and mod assist x2 to stand from EOB using a wide RW. Once in standing, he was unable to lift and advance his BLE, in order to take lateral steps along the EOB. He has a history of back pain, which worsens with activity, and he also reported significant numbness of his feet from neuropathy. Without further OT service, he is at risk for additional falls, further weakness and deconditioning, and restricted ADL participation. Patient will benefit from continued inpatient follow up therapy, <3 hours/day.      If plan is discharge home, recommend the following:   A lot of help with walking and/or transfers;A lot of help with bathing/dressing/bathroom;Assistance with cooking/housework;Help with stairs or ramp for entrance;Assist for transportation     Functional Status Assessment   Patient has had a recent decline in their functional status and demonstrates the ability to make significant improvements in function in a reasonable and predictable amount of time.     Equipment  Recommendations   Other (comment) (defer to next level of care)     Recommendations for Other Services         Precautions/Restrictions   Restrictions Weight Bearing Restrictions Per Provider Order: No     Mobility Bed Mobility Overal bed mobility: Needs Assistance Bed Mobility: Supine to Sit, Sit to Supine     Supine to sit: Min assist, Used rails, HOB elevated Sit to supine: Mod assist (required assist for BLE)        Transfers Overall transfer level: Needs assistance Equipment used: Rolling walker (2 wheels) Transfers: Sit to/from Stand Sit to Stand: Mod assist, From elevated surface, +2 physical assistance           General transfer comment: He required cues for hand placement and trunk extension in standing. He was unable to lift and advance BLE, in order to take lateral steps along EOB. Standing duration ~20 seconds before needing to sit.      Balance     Sitting balance-Leahy Scale: Fair       Standing balance-Leahy Scale: Poor     ADL either performed or assessed with clinical judgement   ADL Overall ADL's : Needs assistance/impaired Eating/Feeding: Independent;Sitting   Grooming: Set up;Sitting           Upper Body Dressing : Supervision/safety;Set up;Sitting   Lower Body Dressing: Maximal assistance;Sitting/lateral leans       Toileting- Clothing Manipulation and Hygiene: Moderate assistance;Maximal assistance Toileting - Clothing Manipulation Details (indicate cue type and reason): at bedside commode level, based on clinical judgement             Vision   Additional  Comments: He correctly read the time depicted on the wall clock.            Pertinent Vitals/Pain Pain Assessment Pain Assessment: 0-10 Pain Score: 5  Pain Location: chronic back pain Pain Intervention(s): Limited activity within patient's tolerance, Monitored during session     Extremity/Trunk Assessment Upper Extremity Assessment Upper Extremity  Assessment: RUE deficits/detail;LUE deficits/detail;Right hand dominant RUE Deficits / Details: AROM WFL. Grip strength 4+/5 LUE Deficits / Details: AROM WFL. Grip strength 4+/5   Lower Extremity Assessment Lower Extremity Assessment: Generalized weakness. He has significant chronic numbness in his feet from neuropathy.        Communication Communication Communication: No apparent difficulties   Cognition Arousal: Alert Behavior During Therapy: WFL for tasks assessed/performed               OT - Cognition Comments: Oriented x4                 Following commands: Intact                  Home Living Family/patient expects to be discharged to:: Private residence Living Arrangements: Spouse/significant other;Children; He stated his spouse has health issues and cannot assist him physically. His children are age 61.  Available Help at Discharge: Family Type of Home: House Home Access: Ramped entrance     Home Layout: One level     Bathroom Shower/Tub: Runner, broadcasting/film/video: Shower seat;Wheelchair - power;Hospital bed          Prior Functioning/Environment Prior Level of Function : Needs assist             Mobility Comments: He performed stand-pivot transfers into and out of his power wheelchair. He has not ambulated in a couple years.  ADLs Comments: He required assist from a friend for lower body dressing and to transfer into and out of the shower. He performed toileting, grooming and upper body dressing without assistance. His friend assisted with cleaning and his daughter performed the cooking. He wore 2.5L O2 all the time.    OT Problem List: Decreased activity tolerance;Impaired balance (sitting and/or standing);Impaired sensation;Pain   OT Treatment/Interventions: Self-care/ADL training;Therapeutic exercise;Energy conservation;Therapeutic activities;DME and/or AE instruction;Patient/family education;Balance training      OT  Goals(Current goals can be found in the care plan section)   Acute Rehab OT Goals Patient Stated Goal: to be as independent as possible OT Goal Formulation: With patient Time For Goal Achievement: 11/22/23 Potential to Achieve Goals: Good ADL Goals Pt Will Perform Lower Body Dressing: with adaptive equipment;sitting/lateral leans;sit to/from stand;with supervision Pt Will Transfer to Toilet: with supervision;stand pivot transfer;bedside commode Pt Will Perform Toileting - Clothing Manipulation and hygiene: with supervision;sit to/from stand   OT Frequency:  Min 2X/week       AM-PAC OT 6 Clicks Daily Activity     Outcome Measure Help from another person eating meals?: None Help from another person taking care of personal grooming?: A Little Help from another person toileting, which includes using toliet, bedpan, or urinal?: A Lot Help from another person bathing (including washing, rinsing, drying)?: A Lot Help from another person to put on and taking off regular upper body clothing?: A Little Help from another person to put on and taking off regular lower body clothing?: A Lot 6 Click Score: 16   End of Session Equipment Utilized During Treatment: Gait belt;Rolling walker (2 wheels) Nurse Communication: Mobility status  Activity Tolerance:  Patient limited by pain Patient left: in bed;with call bell/phone within reach;with bed alarm set  OT Visit Diagnosis: Unsteadiness on feet (R26.81);Other abnormalities of gait and mobility (R26.89);Muscle weakness (generalized) (M62.81);Pain;History of falling (Z91.81) Pain - part of body:  (back)                Time: 8640-8572 OT Time Calculation (min): 28 min Charges:  OT General Charges $OT Visit: 1 Visit OT Evaluation $OT Eval Moderate Complexity: 1 Mod OT Treatments $Therapeutic Activity: 8-22 mins    Delanna LITTIE [name], OTR/L 11/08/2023, 5:37 PM

## 2023-11-08 NOTE — Progress Notes (Signed)
 PROGRESS NOTE    [name]  FMW:[id number] DOB: 18-Jul-1965 DOA: 10/29/2023 PCP: Seabron Lenis, MD   Brief Narrative:  A 58 yr old male patient with OSA/OHS, morbid obesity, D-CHF, and GERD who had UTI and sepsis before leading to AMS per his wife. He has AMS for 2 days, he was found by EMS laying in his own urine and feces from 2 days. He has home aid that came yesterday but he wouldn't let her clean him up. He apparently fell out of his powerchair - initially unable to corroborate history due to mental status changes.  Patient initially admitted to the hospitalist group but transferred to ICU given profound respiratory distress, hypoxia, and concern for worsening sepsis -ultimately ended up requiring intubation for respiratory support.  At this time patient is now extubated, transition back to hospitalist team for further evaluation treatment and discharge planning.  Will require home ventilator prior to discharge, per insurance he requires new updated PFTs which can only be done at main campus during the week.  As such patient remain inpatient over the weekend to continue supportive care and physical therapy until PFTs can be completed and home ventilator can be provided.  Hospital course 7/18: admit to ICU. Given 1.5 L LR, Vanco, Flagyl , and Cefepime  in ED.   7/20 antibiotics continued, getting a lactulose , cultures negative, interactive, requiring increased stimulation overnight in order to respond 7/21 had requested pain medication and received this around 1 AM, received Percocet.  Requiring sternal rub to respond.  Noted to have snuff in his mouth, with episode of desaturation noted by nursing staff.  Barely responsive in the morning on 7/21 arterial blood gas obtained On BiPAP pH is 7.18 PCO2 of greater than 123, O2 94 bicarbonate 50.  Daytime team contacted on arrival patient on BiPAP, he will open his eyes, he can follow commands but has bilateral lateral Asterix.  Rapidly falls back  asleep, intubated, ABX changed to Unasyn . Started aggressive diuresis. Radial a line placed.  7/22 some bradycardia during evening.  Bowel movement achieved after bowel regimen initiated.  Started ketamine  infusion at 0.5 mg/kg/h, required low-dose clonazepam  with concern about hallucination.  Continued aggressive diuresis, weaning oxygen  7/23 Extubated successfully without incident 7/26 transfer back to [facility] -awaiting PFTs for ventilator approval and discharge likely back home  Assessment & Plan:   Principal Problem:   Sepsis (HCC) Active Problems:   Acute on chronic respiratory failure with hypercapnia (HCC)   Hypercapnia  Acute metabolic encephalopathy secondary to sepsis, and acute on chronic hypercarbic respiratory failure,  Status post fall (PTA), has h/o chronic pain w/ intrathecal pain pump -Resolved, appears to be back to baseline - PT/OT eval pending - OOB today hopefully to chair (doesn't walk at baseline due to back pain) -Pain pump ongoing, to be managed outpatient by pain clinic next week   Acute on chronic hypercarbic and hypoxic respiratory failure secondary to decompensated obstructive sleep apnea with obesity hypoventilation syndrome, presumed group 3 pulmonary hypertension, and decompensated diastolic heart failure - Markedly hypercarbic given ABG with pH of 7.18 prior to intubation - Patient does have chronic hypercarbia given compensation with CO2 around 70 per history - Complicated by narcotic use, obesity and sleep apnea - Improving with diuretics, wean as appropriate  - Prior antibiotics completed - At bedtime BiPAP. Orders placed for home vent and TOC consulted - Insurance updated team that home ventilator will not be paid for until patient has repeat PFTs to qualify which cannot be done  until 7/28 at main campus before they will consider approval.   E. coli UTI with greater than 100,000 colonies Completed antibiotics 11/06/2023   Intermittent fluid and  Electrolyte imbalance secondary to aggressive diuresis Resolving, follow repeat labs   Hyperammonemia, most likely passive congestion related Off lactulose , follow clinically   Hyperglycemia without history of diabetes - Glucose well-controlled, continue to advance diet as tolerated   Diarrhea - Possibly iatrogenic, decrease as needed medications, lactulose  discontinued as above  DVT prophylaxis: enoxaparin  (LOVENOX ) injection 30 mg Start: 10/30/23 1000 Code Status:   Code Status: Full Code Family Communication: None present  Status is: Inpatient  Dispo: The patient is from: Home              Anticipated d/c is to: Home              Anticipated d/c date is: 24 to 48 hours              Patient currently not medically stable for discharge  Consultants:  PCCM  Antimicrobials:  Completed  Subjective: No acute issues or events overnight denies nausea vomiting diarrhea constipation headache fevers chills or chest pain  Objective: Vitals:   11/08/23 0316 11/08/23 0400 11/08/23 0500 11/08/23 0600  BP:  131/69 123/72 135/71  Pulse:  (!) 59 (!) 58 62  Resp:  15 12 14   Temp: 97.7 F (36.5 C)     TempSrc: Axillary     SpO2:  100% 98% 100%  Weight:      Height:        Intake/Output Summary (Last 24 hours) at 11/08/2023 0717 Last data filed at 11/08/2023 0318 Gross per 24 hour  Intake --  Output 3100 ml  Net -3100 ml   Filed Weights   11/06/23 0500 11/07/23 0417 11/08/23 0300  Weight: (!) 163 kg (!) 164.2 kg (!) 160.9 kg    Examination:  General:  Pleasantly resting in bed, No acute distress. Lungs: Distant lung sounds throughout without overt wheeze rhonchi or rales Heart:  Regular rate and rhythm.  Without murmurs, rubs, or gallops. Abdomen:  Soft, obese, nontender, nondistended.  Without guarding or rebound. Extremities: Without cyanosis, clubbing, or obvious deformity.  Data Reviewed: I have personally reviewed following labs and imaging studies  CBC: Recent  Labs  Lab 11/03/23 0430 11/04/23 0306 11/06/23 0308 11/07/23 0259 11/07/23 1423  WBC 8.8 8.2 6.8 6.3 7.1  HGB 10.2* 10.8* 11.6* 11.1* 11.4*  HCT 34.7* 38.3* 39.1 37.9* 39.4  MCV 95.1 95.8 93.3 94.0 94.0  PLT 280 279 268 286 310   Basic Metabolic Panel: Recent Labs  Lab 11/02/23 1942 11/03/23 0430 11/04/23 0306 11/04/23 1727 11/05/23 0312 11/06/23 0308 11/06/23 1011 11/07/23 0259 11/08/23 0535  NA  --  137 142 141  --   --  137 136 136  K  --  3.1* 3.8 4.4  --   --  3.4* 3.7 3.4*  CL  --  88* 90* 89*  --   --  85* 84* 88*  CO2  --  36* 37* 39*  --   --  37* 38* 38*  GLUCOSE  --  134* 130* 142*  --   --  135* 120* 120*  BUN  --  20 18 14   --   --  11 11 13   CREATININE  --  0.69 0.71 0.66  --   --  0.77 0.62 0.54*  CALCIUM   --  8.3* 8.6* 8.9  --   --  9.0 8.8* 8.6*  MG 2.0  --  1.9  --  2.3 2.4  --  2.1  --   PHOS <1.0* 3.0 3.9  --  3.6 3.4  --  3.0  --    GFR: Estimated Creatinine Clearance: 157.8 mL/min (A) (by C-G formula based on SCr of 0.54 mg/dL (L)). Liver Function Tests: Recent Labs  Lab 11/08/23 0535  AST 27  ALT 23  ALKPHOS 57  BILITOT 0.7  PROT 7.6  ALBUMIN 3.0*   No results for input(s): LIPASE, AMYLASE in the last 168 hours. Recent Labs  Lab 11/02/23 0255 11/06/23 0308  AMMONIA 51* 32   Coagulation Profile: No results for input(s): INR, PROTIME in the last 168 hours. Cardiac Enzymes: No results for input(s): CKTOTAL, CKMB, CKMBINDEX, TROPONINI in the last 168 hours. BNP (last 3 results) No results for input(s): PROBNP in the last 8760 hours. HbA1C: No results for input(s): HGBA1C in the last 72 hours. CBG: Recent Labs  Lab 11/03/23 2356 11/04/23 0402 11/04/23 0801 11/04/23 1219 11/04/23 1543  GLUCAP 112* 114* 129* 112* 110*   Lipid Profile: No results for input(s): CHOL, HDL, LDLCALC, TRIG, CHOLHDL, LDLDIRECT in the last 72 hours. Thyroid  Function Tests: No results for input(s): TSH, T4TOTAL,  FREET4, T3FREE, THYROIDAB in the last 72 hours. Anemia Panel: No results for input(s): VITAMINB12, FOLATE, FERRITIN, TIBC, IRON, RETICCTPCT in the last 72 hours. Sepsis Labs: Recent Labs  Lab 11/02/23 0255 11/03/23 0430 11/04/23 0306 11/05/23 0312  PROCALCITON <0.10 <0.10 <0.10 <0.10    Recent Results (from the past 240 hours)  Blood Culture (routine x 2)     Status: None   Collection Time: 10/30/23 12:00 AM   Specimen: BLOOD RIGHT FOREARM  Result Value Ref Range Status   Specimen Description   Final    BLOOD RIGHT FOREARM Performed at [facility], [address], [place], [place] [address]    Special Requests   Final    BOTTLES DRAWN AEROBIC AND ANAEROBIC Blood Culture adequate volume Performed at [facility], [address], [place], [place] [address]    Culture   Final    NO GROWTH 5 DAYS Performed at [facility], [address] [place], [place] [address]    Report Status 11/04/2023 FINAL  Final  Urine Culture     Status: Abnormal   Collection Time: 10/30/23 12:52 AM   Specimen: Urine, Random  Result Value Ref Range Status   Specimen Description   Final    URINE, RANDOM Performed at [facility], [address], [place], [place] [address]    Special Requests   Final    NONE Reflexed from [id number] Performed at [facility], [address], [place], [place] [address]    Culture >=100,000 COLONIES/mL ESCHERICHIA COLI (A)  Final   Report Status 11/02/2023 FINAL  Final   Organism ID, Bacteria ESCHERICHIA COLI (A)  Final      Susceptibility   Escherichia coli - MIC*    AMPICILLIN  <=2 SENSITIVE Sensitive     CEFAZOLIN  <=4 SENSITIVE Sensitive     CEFEPIME  <=0.12 SENSITIVE Sensitive     CEFTRIAXONE  <=0.25 SENSITIVE Sensitive     CIPROFLOXACIN <=0.25 SENSITIVE Sensitive     GENTAMICIN <=1 SENSITIVE Sensitive     IMIPENEM <=0.25 SENSITIVE Sensitive     NITROFURANTOIN <=16 SENSITIVE  Sensitive     TRIMETH/SULFA <=20 SENSITIVE Sensitive     AMPICILLIN /SULBACTAM <=2 SENSITIVE Sensitive  PIP/TAZO <=4 SENSITIVE Sensitive ug/mL    * >=100,000 COLONIES/mL ESCHERICHIA COLI  Resp panel by RT-PCR (RSV, Flu A&B, Covid) Anterior Nasal Swab     Status: None   Collection Time: 10/30/23  1:00 AM   Specimen: Anterior Nasal Swab  Result Value Ref Range Status   SARS Coronavirus 2 by RT PCR NEGATIVE NEGATIVE Final    Comment: (NOTE) SARS-CoV-2 target nucleic acids are NOT DETECTED.  The SARS-CoV-2 RNA is generally detectable in upper respiratory specimens during the acute phase of infection. The lowest concentration of SARS-CoV-2 viral copies this assay can detect is 138 copies/mL. A negative result does not preclude SARS-Cov-2 infection and should not be used as the sole basis for treatment or other patient management decisions. A negative result may occur with  improper specimen collection/handling, submission of specimen other than nasopharyngeal swab, presence of viral mutation(s) within the areas targeted by this assay, and inadequate number of viral copies(<138 copies/mL). A negative result must be combined with clinical observations, patient history, and epidemiological information. The expected result is Negative.  Fact Sheet for Patients:  BloggerCourse.com  Fact Sheet for Healthcare Providers:  SeriousBroker.it  This test is no t yet approved or cleared by the [place]  FDA and  has been authorized for detection and/or diagnosis of SARS-CoV-2 by FDA under an Emergency Use Authorization (EUA). This EUA will remain  in effect (meaning this test can be used) for the duration of the COVID-19 declaration under Section 564(b)(1) of the Act, 21 U.S.C.section 360bbb-3(b)(1), unless the authorization is terminated  or revoked sooner.       Influenza A by PCR NEGATIVE NEGATIVE Final   Influenza B by PCR NEGATIVE  NEGATIVE Final    Comment: (NOTE) The Xpert Xpress SARS-CoV-2/FLU/RSV plus assay is intended as an aid in the diagnosis of influenza from Nasopharyngeal swab specimens and should not be used as a sole basis for treatment. Nasal washings and aspirates are unacceptable for Xpert Xpress SARS-CoV-2/FLU/RSV testing.  Fact Sheet for Patients: BloggerCourse.com  Fact Sheet for Healthcare Providers: SeriousBroker.it  This test is not yet approved or cleared by the [place]  FDA and has been authorized for detection and/or diagnosis of SARS-CoV-2 by FDA under an Emergency Use Authorization (EUA). This EUA will remain in effect (meaning this test can be used) for the duration of the COVID-19 declaration under Section 564(b)(1) of the Act, 21 U.S.C. section 360bbb-3(b)(1), unless the authorization is terminated or revoked.     Resp Syncytial Virus by PCR NEGATIVE NEGATIVE Final    Comment: (NOTE) Fact Sheet for Patients: BloggerCourse.com  Fact Sheet for Healthcare Providers: SeriousBroker.it  This test is not yet approved or cleared by the [place]  FDA and has been authorized for detection and/or diagnosis of SARS-CoV-2 by FDA under an Emergency Use Authorization (EUA). This EUA will remain in effect (meaning this test can be used) for the duration of the COVID-19 declaration under Section 564(b)(1) of the Act, 21 U.S.C. section 360bbb-3(b)(1), unless the authorization is terminated or revoked.  Performed at [facility], [address] [place], [place] [address]   Blood Culture (routine x 2)     Status: None   Collection Time: 10/30/23  1:00 AM   Specimen: BLOOD LEFT ARM  Result Value Ref Range Status   Specimen Description   Final    BLOOD LEFT ARM Performed at [facility], [address] [place], [place] [address]    Special Requests   Final  BOTTLES DRAWN AEROBIC AND ANAEROBIC Blood Culture results may not be optimal due to an inadequate volume of blood received in culture bottles Performed at [facility], [address], [place], [place] [address]    Culture   Final    NO GROWTH 5 DAYS Performed at [facility], [address] [place], [place] [address]    Report Status 11/04/2023 FINAL  Final  MRSA Next Gen by PCR, Nasal     Status: None   Collection Time: 10/30/23  5:50 AM   Specimen: Nasal Mucosa; Nasal Swab  Result Value Ref Range Status   MRSA by PCR Next Gen NOT DETECTED NOT DETECTED Final    Comment: (NOTE) The GeneXpert MRSA Assay (FDA approved for NASAL specimens only), is one component of a comprehensive MRSA colonization surveillance program. It is not intended to diagnose MRSA infection nor to guide or monitor treatment for MRSA infections. Test performance is not FDA approved in patients less than 65 years old. Performed at [facility], [address], [place], [place] [address]   Culture, Respiratory w Gram Stain     Status: None   Collection Time: 11/02/23  9:18 AM   Specimen: Tracheal Aspirate; Respiratory  Result Value Ref Range Status   Specimen Description   Final    TRACHEAL ASPIRATE Performed at [facility], [address] [place], [place] [address]    Special Requests   Final    NONE Performed at [facility], [address] [place], [place] [address]    Gram Stain NO WBC SEEN NO ORGANISMS SEEN   Final   Culture   Final    Normal respiratory flora-no Staph aureus or Pseudomonas seen Performed at [facility], [address] [place], [place] [address]    Report Status 11/04/2023 FINAL  Final    Radiology Studies: No results found.  Scheduled Meds:  Chlorhexidine  Gluconate Cloth  6 each Topical Daily   DULoxetine   90 mg Oral Daily   enoxaparin  (LOVENOX ) injection  30 mg Subcutaneous Q12H    furosemide   40 mg Intravenous BID   nystatin    Topical TID   pantoprazole   40 mg Oral Daily   polyethylene glycol  17 g Oral Daily   Continuous Infusions:  Fentany Intrathecal pump 1300 mcg/day ( 2000 mcg/ml x 40ml)       LOS: 9 days   Time spent:  Elsie JAYSON Montclair, DO [facility]  If 7PM-7AM, please contact night-coverage [email or web address]  11/08/2023, 7:17 AM

## 2023-11-09 ENCOUNTER — Other Ambulatory Visit (HOSPITAL_BASED_OUTPATIENT_CLINIC_OR_DEPARTMENT_OTHER): Payer: Self-pay

## 2023-11-09 ENCOUNTER — Telehealth: Payer: Self-pay | Admitting: Internal Medicine

## 2023-11-09 ENCOUNTER — Other Ambulatory Visit (HOSPITAL_COMMUNITY): Payer: Self-pay

## 2023-11-09 DIAGNOSIS — G9341 Metabolic encephalopathy: Secondary | ICD-10-CM | POA: Diagnosis not present

## 2023-11-09 DIAGNOSIS — J9611 Chronic respiratory failure with hypoxia: Secondary | ICD-10-CM

## 2023-11-09 DIAGNOSIS — J9622 Acute and chronic respiratory failure with hypercapnia: Secondary | ICD-10-CM | POA: Diagnosis not present

## 2023-11-09 DIAGNOSIS — G4733 Obstructive sleep apnea (adult) (pediatric): Secondary | ICD-10-CM

## 2023-11-09 DIAGNOSIS — J9621 Acute and chronic respiratory failure with hypoxia: Secondary | ICD-10-CM | POA: Diagnosis not present

## 2023-11-09 MED ORDER — FUROSEMIDE 20 MG PO TABS
40.0000 mg | ORAL_TABLET | Freq: Every day | ORAL | 0 refills | Status: AC
  Filled 2023-11-09: qty 60, 30d supply, fill #0

## 2023-11-09 MED ORDER — ESOMEPRAZOLE MAGNESIUM 40 MG PO CPDR
40.0000 mg | DELAYED_RELEASE_CAPSULE | Freq: Every morning | ORAL | 0 refills | Status: AC
  Filled 2023-11-09: qty 30, 30d supply, fill #0

## 2023-11-09 NOTE — Telephone Encounter (Signed)
 Patient has OSA and has been seen in our office for this previously. Not using cpap therapy previously but agreeable to using now.   Please prescribe new start CPAP 8 cm H20  2.5L bleed in Medium face mask Heated humidification  Needs office follow up in 31-90 days of new start CPAP therapy with APP or WO. (Please arrange for 6 week hospital follow up for OSA.)  May ultimately need repeat titration study.

## 2023-11-09 NOTE — Discharge Summary (Signed)
 Physician Discharge Summary  [name] FMW:[id number] DOB: [birth year] DOA: 10/29/2023  PCP: Seabron Lenis, MD  Admit date: 10/29/2023 Discharge date: 11/09/2023  Admitted From: Home Disposition: Home  Recommendations for Outpatient Follow-up:  Follow up with PCP in 1-2 weeks Follow-up with pulmonology as scheduled for repeat evaluation:  Home Health: PT OT Equipment/Devices: None  Discharge Condition: Stable CODE STATUS: Full Diet recommendation: Low-salt low-fat low-carb diet  Brief/Interim Summary: A 58 yr old male patient with OSA/OHS, morbid obesity, D-CHF, and GERD who had UTI and sepsis before leading to AMS per his wife. He has AMS for 2 days, he was found by EMS laying in his own urine and feces from 2 days. He has home aid that came yesterday but he wouldn't let her clean him up. He apparently fell out of his powerchair - initially unable to corroborate history due to mental status changes.   Patient initially admitted to the hospitalist group but transferred to ICU given profound respiratory distress, hypoxia, and concern for worsening sepsis -ultimately ended up requiring intubation for respiratory support.  At this time patient is now extubated, transition back to hospitalist team for further evaluation treatment and discharge planning.  Will require home ventilator prior to discharge, per insurance he requires new updated PFTs which can only be done at main campus during the week.  As such patient remain inpatient over the weekend to continue supportive care and physical therapy until PFTs can be completed and home ventilator can be provided.  **Unfortunately patient's insurance/DME committee will not release home ventilator without new PFTs which cannot be done in the inpatient setting.  As such discussed with pulmonology will set up patient for continued home oxygen  as well as CPAP at night in the interim until PFTs can be repeated in the outpatient setting.  Of note  patient does have recent PFTs in 2021 -but these are apparently not up-to-date enough to qualify for home ventilator despite multiple findings here.   Hospital course 7/18: admit to ICU. Given 1.5 L LR, Vanco, Flagyl , and Cefepime  in ED.   7/20 antibiotics continued, getting a lactulose , cultures negative, interactive, requiring increased stimulation overnight in order to respond 7/21 had requested pain medication and received this around 1 AM, received Percocet.  Requiring sternal rub to respond.  Noted to have snuff in his mouth, with episode of desaturation noted by nursing staff.  Barely responsive in the morning on 7/21 arterial blood gas obtained On BiPAP pH is 7.18 PCO2 of greater than 123, O2 94 bicarbonate 50.  Daytime team contacted on arrival patient on BiPAP, he will open his eyes, he can follow commands but has bilateral lateral Asterix.  Rapidly falls back asleep, intubated, ABX changed to Unasyn . Started aggressive diuresis. Radial a line placed.  7/22 some bradycardia during evening.  Bowel movement achieved after bowel regimen initiated.  Started ketamine  infusion at 0.5 mg/kg/h, required low-dose clonazepam  with concern about hallucination.  Continued aggressive diuresis, weaning oxygen  7/23 Extubated successfully without incident 7/26 transfer back to [facility] -awaiting PFTs for ventilator approval and discharge likely back home 7/27 despite having recent PFTs in 2021 insurance continues to deny payment for device, discharged with CPAP and oxygen  as above  Discharge Diagnoses:  Principal Problem:   Sepsis (HCC) Active Problems:   Acute on chronic respiratory failure with hypercapnia (HCC)   Hypercapnia  Acute metabolic encephalopathy secondary to sepsis, and acute on chronic hypercarbic respiratory failure -resolved Status post fall (PTA), has h/o chronic pain w/ intrathecal  pain pump -Resolved, appears to be back to baseline - PT/OT recommending home health PT OT - OOB today  hopefully to chair (doesn't walk at baseline due to back pain) - Pain pump ongoing, to be managed outpatient by pain clinic later this week   Acute on chronic hypercarbic and hypoxic respiratory failure (multifactorial) Complicated by decompensated obstructive sleep apnea with obesity hypoventilation syndrome, concurrent presumed group 3 pulmonary hypertension, and decompensated diastolic heart failure - Markedly hypercarbic given ABG with pH of 7.18 prior to intubation - Patient has well-known and established hypercarbia given compensation with CO2 around 70 per chart review - Complicated by narcotic use, obesity, heart failure, and sleep apnea - Improving with diuretics, continue increased dose at home - Prior antibiotics completed - Continue CPAP per discussion with pulm outpatient PFTs as soon as patient can be evaluated   E. coli UTI with greater than 100,000 colonies Completed antibiotics 11/06/2023   Intermittent fluid and Electrolyte imbalance secondary to aggressive diuresis Resolving -repeat labs with PCP in 1 to 2 weeks   Hyperammonemia, most likely passive congestion related Off lactulose , follow clinically   Hyperglycemia without history of diabetes - Glucose well-controlled, continue to advance diet as tolerated   Diarrhea - Possibly iatrogenic, decrease as needed medications, lactulose  discontinued as above   Discharge Instructions  Discharge Instructions     Call MD for:  difficulty breathing, headache or visual disturbances   Complete by: As directed    Call MD for:  extreme fatigue   Complete by: As directed    Call MD for:  hives   Complete by: As directed    Call MD for:  persistant dizziness or light-headedness   Complete by: As directed    Call MD for:  persistant nausea and vomiting   Complete by: As directed    Call MD for:  severe uncontrolled pain   Complete by: As directed    Call MD for:  temperature >100.4   Complete by: As directed    Diet -  low sodium heart healthy   Complete by: As directed    Increase activity slowly   Complete by: As directed    No wound care   Complete by: As directed       Allergies as of 11/09/2023       Reactions   Tape Other (See Comments)   Blisters - tolerates paper tape well        Medication List     STOP taking these medications    clotrimazole -betamethasone  cream Commonly known as: LOTRISONE    promethazine  25 MG tablet Commonly known as: PHENERGAN        TAKE these medications    acetaminophen  500 MG tablet Commonly known as: TYLENOL  Take 1,000 mg by mouth every 6 (six) hours as needed for mild pain or moderate pain.   DULoxetine  30 MG capsule Commonly known as: CYMBALTA  Take 90 mg by mouth daily.   esomeprazole  40 MG capsule Commonly known as: NEXIUM  Take 1 capsule (40 mg total) by mouth every morning. What changed: when to take this   furosemide  20 MG tablet Commonly known as: LASIX  Take 2 tablets (40 mg total) by mouth daily. What changed: how much to take   naloxone  4 MG/0.1ML Liqd nasal spray kit Commonly known as: NARCAN  Place 1 spray into the nose as needed for up to 365 doses (for opioid-induced respiratory depresssion). In case of emergency (overdose), spray once into each nostril. If no response within 3 minutes, repeat  application and call 911.   nystatin  powder Commonly known as: MYCOSTATIN /NYSTOP  Apply topically 2 (two) times daily. What changed:  how much to take when to take this   PRESCRIPTION MEDICATION continuous. Fentanyl  pump- unknown dose               Durable Medical Equipment  (From admission, onward)           Start     Ordered   11/05/23 0901  For home use only DME Ventilator  Once       Question:  Length of Need  Answer:  Lifetime   11/05/23 0900            Allergies  Allergen Reactions   Tape Other (See Comments)    Blisters - tolerates paper tape well     Consultations: PCCM   Procedures/Studies: Portable Chest xray Result Date: 11/03/2023 CLINICAL DATA:  Respiratory failure, endotracheal tube positioning EXAM: PORTABLE CHEST 1 VIEW COMPARISON:  11/02/2023 FINDINGS: The endotracheal tube tip is 3.7 cm above the carina, satisfactorily positioned. A nasogastric tube extends down into the stomach. The patient is rotated to the right on today's radiograph, reducing diagnostic sensitivity and specificity. Reverse lordotic projection. Increased airspace opacity at the left lung base with worsening obscuration of the left hemidiaphragm. Low lung volumes on the right with suspected elevated right hemidiaphragm and possible atelectasis in the right lower lobe. Mild to moderate enlargement of the cardiopericardial silhouette with indistinct pulmonary vasculature favoring pulmonary venous hypertension. IMPRESSION: 1. Satisfactorily positioned endotracheal tube and nasogastric tube. 2. Increased airspace opacity at the left lung base with worsening obscuration of the left hemidiaphragm. This could be due to atelectasis, pneumonia, or pleural effusion. 3. Low lung volumes on the right with suspected elevated right hemidiaphragm and possible atelectasis in the right lower lobe. 4. Mild to moderate enlargement of the cardiopericardial silhouette with indistinct pulmonary vasculature favoring pulmonary venous hypertension. Electronically Signed   By: Ryan Salvage M.D.   On: 11/03/2023 09:22   DG Abd 1 View Result Date: 11/02/2023 EXAM: 1 VIEW XRAY OF THE ABDOMEN 11/02/2023 03:37:00 PM COMPARISON: 11/02/2023 09:37:00 AM CLINICAL HISTORY: Encounter for nasogastric (NG) tube placement. FINDINGS: BOWEL: Nonobstructive bowel gas pattern with the majority of the right side of the abdomen and entire pelvis excluded. SOFT TISSUES: Nasogastric tube terminates in the proximal stomach with a side port just below the gastroesophageal junction. BONES: IMPRESSION: 1.  Nasogastric tube terminates in the proximal stomach with a side port just below the gastroesophageal junction. Electronically signed by: Rockey Kilts MD 11/02/2023 05:11 PM EDT RP Workstation: [id number]   DG Abd 1 View Result Date: 11/02/2023 CLINICAL DATA:  Enteric tube placement EXAM: ABDOMEN - 1 VIEW COMPARISON:  CT abdomen and pelvis dated 08/23/2022 FINDINGS: Gastric/enteric tube tip projects over the stomach with side hole projecting over the gastroesophageal junction. Partially imaged nonobstructive bowel gas pattern. IMPRESSION: Gastric/enteric tube tip projects over the stomach with side hole projecting over the gastroesophageal junction. Recommend advancing by 5 cm for more optimal positioning. Electronically Signed   By: Limin  Xu M.D.   On: 11/02/2023 10:21   Portable Chest x-ray Result Date: 11/02/2023 CLINICAL DATA:  Endotracheal tube. EXAM: PORTABLE CHEST 1 VIEW COMPARISON:  10/30/2023. FINDINGS: Interval endotracheal tube placement with tip approximately 4.2 cm above the carina. Heart is enlarged, stable. Lungs are somewhat low in volume with improved mixed interstitial and airspace opacification. Right hemidiaphragm is elevated. No definite pleural fluid. IMPRESSION: 1. Satisfactory endotracheal tube placement. 2.  Improving pulmonary edema. Electronically Signed   By: Newell Eke M.D.   On: 11/02/2023 08:16   ECHOCARDIOGRAM COMPLETE Result Date: 10/30/2023    ECHOCARDIOGRAM REPORT   Patient Name:   WARIS RODGER Date of Exam: 10/30/2023 Medical Rec #:  [id number]       Height:       71.0 in Accession #:    [id number]      Weight:       373.5 lb Date of Birth:  18-Nov-1965       BSA:          2.749 m Patient Age:    57 years        BP:           128/77 mmHg Patient Gender: M               HR:           82 bpm. Exam Location:  Inpatient Procedure: 2D Echo, Cardiac Doppler, Color Doppler and Intracardiac            Opacification Agent (Both Spectral and Color Flow Doppler were             utilized during procedure). Indications:    CHF- Acute Diastolic  History:        Patient has prior history of Echocardiogram examinations, most                 recent 06/12/2019. CHF, COPD and Pre-diabetes; Risk Factors:Sleep                 Apnea.  Sonographer:    Koleen Popper RDCS Referring Phys: [id number] OMAR M ALBUSTAMI  Sonographer Comments: Technically difficult study due to poor echo windows. Image acquisition challenging due to patient body habitus and Image acquisition challenging due to COPD. IMPRESSIONS  1. Very poor acoustic windows limit study.  2. Left ventricular ejection fraction, by estimation, is 65 to 70%. The left ventricle has normal function. There is mild left ventricular hypertrophy.  3. Right ventricular systolic function is normal. The right ventricular size is normal.  4. The mitral valve was not well visualized. Trivial mitral valve regurgitation.  5. The aortic valve is tricuspid. Aortic valve regurgitation is not visualized.  6. The inferior vena cava is normal in size with greater than 50% respiratory variability, suggesting right atrial pressure of 3 mmHg. Comparison(s): The left ventricular function is unchanged. FINDINGS  Left Ventricle: Left ventricular ejection fraction, by estimation, is 65 to 70%. The left ventricle has normal function. Definity  contrast agent was given IV to delineate the left ventricular endocardial borders. The left ventricular internal cavity size was normal in size. There is mild left ventricular hypertrophy. Right Ventricle: The right ventricular size is normal. Right vetricular wall thickness was not assessed. Right ventricular systolic function is normal. Left Atrium: Left atrial size was normal in size. Right Atrium: Right atrial size was normal in size. Pericardium: There is no evidence of pericardial effusion. Mitral Valve: The mitral valve was not well visualized. There is mild thickening of the mitral valve leaflet(s). Mild mitral annular  calcification. Trivial mitral valve regurgitation. Tricuspid Valve: The tricuspid valve is grossly normal. Tricuspid valve regurgitation is trivial. Aortic Valve: The aortic valve is tricuspid. Aortic valve regurgitation is not visualized. Pulmonic Valve: The pulmonic valve was not well visualized. Pulmonic valve regurgitation is not visualized. No evidence of pulmonic stenosis. Aorta: The aortic root and ascending aorta are structurally normal, with no evidence  of dilitation. Venous: The inferior vena cava is normal in size with greater than 50% respiratory variability, suggesting right atrial pressure of 3 mmHg. IAS/Shunts: The interatrial septum was not assessed.  LEFT VENTRICLE PLAX 2D LVIDd:         5.40 cm   Diastology LVIDs:         3.40 cm   LV e' medial:    6.31 cm/s LV PW:         1.20 cm   LV E/e' medial:  15.5 LV IVS:        1.30 cm   LV e' lateral:   9.79 cm/s LVOT diam:     2.40 cm   LV E/e' lateral: 10.0 LV SV:         83 LV SV Index:   30 LVOT Area:     4.52 cm  IVC IVC diam: 1.80 cm LEFT ATRIUM           Index LA diam:      4.40 cm 1.60 cm/m LA Vol (A2C): 44.7 ml 16.26 ml/m LA Vol (A4C): 43.2 ml 15.71 ml/m  AORTIC VALVE LVOT Vmax:   104.00 cm/s LVOT Vmean:  67.300 cm/s LVOT VTI:    0.184 m  AORTA Ao Root diam: 3.40 cm Ao Asc diam:  3.80 cm MITRAL VALVE MV Area (PHT): 3.63 cm     SHUNTS MV Decel Time: 209 msec     Systemic VTI:  0.18 m MV E velocity: 97.90 cm/s   Systemic Diam: 2.40 cm MV A velocity: 106.00 cm/s MV E/A ratio:  0.92 Vina Gull MD Electronically signed by Vina Gull MD Signature Date/Time: 10/30/2023/12:44:48 PM    Final    CT HEAD WO CONTRAST ( ) Result Date: 10/30/2023 EXAM: CT HEAD WITHOUT CONTRAST 10/30/2023 05:31:44 AM TECHNIQUE: CT of the head was performed without the administration of intravenous contrast. Automated exposure control, iterative reconstruction, and/or weight based adjustment of the mA/kV was utilized to reduce the radiation dose to as low as reasonably  achievable. COMPARISON: 08/22/2022 CLINICAL HISTORY: AMS, fall. Altered mental status, fall; Pt repositioned, would not hold still or stop screaming. FINDINGS: BRAIN AND VENTRICLES: No acute hemorrhage. Gray-white differentiation is preserved. No hydrocephalus. No extra-axial collection. No mass effect or midline shift. ORBITS: No acute abnormality. SINUSES: Left mastoid air cell effusion. The paranasal sinuses and right mastoid air cell are clear. SOFT TISSUES AND SKULL: No acute soft tissue abnormality. No skull fracture. LIMITATIONS/ARTIFACTS: Exam detail is diminished due to excessive motion artifact. IMPRESSION: 1. No acute intracranial abnormality. 2. Left mastoid air cell effusion. Electronically signed by: Waddell Calk MD 10/30/2023 05:37 AM EDT RP Workstation: [id number]   DG Chest Port 1 View Result Date: 10/30/2023 CLINICAL DATA:  Possible sepsis EXAM: PORTABLE CHEST 1 VIEW COMPARISON:  08/22/2022 FINDINGS: Patient rotation to the right accentuates the mediastinal markings. Cardiac shadow is enlarged. Increased central vascular congestion with edema is noted. No effusion is seen. No bony abnormality is noted. IMPRESSION: Changes of mild CHF. Electronically Signed   By: Oneil Devonshire M.D.   On: 10/30/2023 00:25     Subjective: No acute issues or events overnight, denies nausea vomit diarrhea constipation any fevers chills or chest pain   Discharge Exam: Vitals:   11/09/23 1200 11/09/23 1300  BP:  (!) 147/70  Pulse:  86  Resp:  15  Temp: 98.7 F (37.1 C)   SpO2:  97%   Vitals:   11/09/23 0900 11/09/23 1000 11/09/23 1200 11/09/23 1300  BP: 138/66 124/66  (!) 147/70  Pulse: 62 71  86  Resp: 14 17  15   Temp:   98.7 F (37.1 C)   TempSrc:   Oral   SpO2: 97% 100%  97%  Weight:      Height:        General:  Pleasantly resting in bed, No acute distress. Lungs: Distant lung sounds throughout without overt wheeze rhonchi or rales Heart:  Regular rate and rhythm.  Without murmurs,  rubs, or gallops. Abdomen:  Soft, obese, nontender, nondistended.  Without guarding or rebound. Extremities: Without cyanosis, clubbing, or obvious deformity.    The results of significant diagnostics from this hospitalization (including imaging, microbiology, ancillary and laboratory) are listed below for reference.     Microbiology: Recent Results (from the past 240 hours)  Culture, Respiratory w Gram Stain     Status: None   Collection Time: 11/02/23  9:18 AM   Specimen: Tracheal Aspirate; Respiratory  Result Value Ref Range Status   Specimen Description   Final    TRACHEAL ASPIRATE Performed at [facility], [address] [place], [place] [address]    Special Requests   Final    NONE Performed at [facility], [address], [place], [place] [address]    Gram Stain NO WBC SEEN NO ORGANISMS SEEN   Final   Culture   Final    Normal respiratory flora-no Staph aureus or Pseudomonas seen Performed at [facility], [address], [place], [place] [address]    Report Status 11/04/2023 FINAL  Final     Labs:  Basic Metabolic Panel: Recent Labs  Lab 11/02/23 1942 11/03/23 0430 11/04/23 0306 11/04/23 1727 11/05/23 0312 11/06/23 0308 11/06/23 1011 11/07/23 0259 11/08/23 0535  NA  --  137 142 141  --   --  137 136 136  K  --  3.1* 3.8 4.4  --   --  3.4* 3.7 3.4*  CL  --  88* 90* 89*  --   --  85* 84* 88*  CO2  --  36* 37* 39*  --   --  37* 38* 38*  GLUCOSE  --  134* 130* 142*  --   --  135* 120* 120*  BUN  --  20 18 14   --   --  11 11 13   CREATININE  --  0.69 0.71 0.66  --   --  0.77 0.62 0.54*  CALCIUM   --  8.3* 8.6* 8.9  --   --  9.0 8.8* 8.6*  MG 2.0  --  1.9  --  2.3 2.4  --  2.1  --   PHOS <1.0* 3.0 3.9  --  3.6 3.4  --  3.0  --    Liver Function Tests: Recent Labs  Lab 11/08/23 0535  AST 27  ALT 23  ALKPHOS 57  BILITOT 0.7  PROT 7.6  ALBUMIN 3.0*    Recent Labs  Lab 11/06/23 0308  AMMONIA 32    CBC: Recent Labs  Lab 11/03/23 0430 11/04/23 0306 11/06/23 0308 11/07/23 0259 11/07/23 1423  WBC 8.8 8.2 6.8 6.3 7.1  HGB 10.2* 10.8* 11.6* 11.1* 11.4*  HCT 34.7* 38.3* 39.1 37.9* 39.4  MCV 95.1 95.8 93.3 94.0 94.0  PLT 280 279 268 286 310   CBG: Recent Labs  Lab 11/03/23 2356 11/04/23 0402 11/04/23 0801 11/04/23 1219 11/04/23 1543  GLUCAP 112* 114* 129* 112* 110*   Urinalysis  Component Value Date/Time   COLORURINE AMBER (A) 10/30/2023 0052   APPEARANCEUR HAZY (A) 10/30/2023 0052   LABSPEC 1.018 10/30/2023 0052   PHURINE 5.0 10/30/2023 0052   GLUCOSEU NEGATIVE 10/30/2023 0052   HGBUR SMALL (A) 10/30/2023 0052   BILIRUBINUR NEGATIVE 10/30/2023 0052   KETONESUR NEGATIVE 10/30/2023 0052   PROTEINUR 30 (A) 10/30/2023 0052   UROBILINOGEN 0.2 08/13/2010 1206   NITRITE POSITIVE (A) 10/30/2023 0052   LEUKOCYTESUR MODERATE (A) 10/30/2023 0052   Sepsis Labs Recent Labs  Lab 11/04/23 0306 11/06/23 0308 11/07/23 0259 11/07/23 1423  WBC 8.2 6.8 6.3 7.1   Microbiology Recent Results (from the past 240 hours)  Culture, Respiratory w Gram Stain     Status: None   Collection Time: 11/02/23  9:18 AM   Specimen: Tracheal Aspirate; Respiratory  Result Value Ref Range Status   Specimen Description   Final    TRACHEAL ASPIRATE Performed at [facility], [address] [place], [place] [address]    Special Requests   Final    NONE Performed at [facility], [address], [place], [place] [address]    Gram Stain NO WBC SEEN NO ORGANISMS SEEN   Final   Culture   Final    Normal respiratory flora-no Staph aureus or Pseudomonas seen Performed at [facility], [address], [place], [place] [address]    Report Status 11/04/2023 FINAL  Final     Time coordinating discharge: Over 30 minutes  SIGNED:   Elsie JAYSON Mont[place], DO [facility] 11/09/2023, 2:08 PM Pager   If 7PM-7AM, please contact  night-coverage [email or web address]

## 2023-11-09 NOTE — TOC Progression Note (Addendum)
 Transition of Care [facility]) - Progression Note    Patient Details  Name: [name] MRN: [id number] Date of Birth: 10-28-1965  Transition of Care [facility] CM/SW Contact  Jon ONEIDA Anon, RN Phone Number: 11/09/2023, 12:44 PM  Clinical Narrative:    NCM spoke with rep Mitch with [employer] and he states pt will need a PFT, or bedside spirometry within the past 2 years for insurance to approve home ventilator. MD made aware. States pt will need to travel to main campus in order to have this completed. Awaiting new results and updated MD progress notes discussing restrictive thoracic disorder diagnosis. Pt has declined SNF recommendation, prefers to return home with [facility]. Pt will need HH PT/OT orders at DC. Care management continuing to follow.   Expected Discharge Plan: Home w Home Health Services Barriers to Discharge: Continued Medical Work up               Expected Discharge Plan and Services In-house Referral: NA Discharge Planning Services: CM Consult Post Acute Care Choice: Home Health Living arrangements for the past 2 months: Single Family Home                 DME Arranged: NIV DME Agency: [employer] Date DME Agency Contacted: 11/06/23 Time DME Agency Contacted: [phone] Representative spoke with at DME Agency: Mitch HH Arranged: NA HH Agency: NA         Social Drivers of Health (SDOH) Interventions SDOH Screenings   Food Insecurity: No Food Insecurity (08/12/2022)   Received from [facility]  Housing: Low Risk  (04/01/2022)  Transportation Needs: No Transportation Needs (08/12/2022)   Received from [facility]  Utilities: Not At Risk (08/12/2022)   [facility] from [facility]  Depression (PHQ2-9): Low Risk  (03/19/2021)  Financial Resource Strain: Medium Risk (04/18/2022)   Received from [facility]  Physical Activity: Unknown (04/18/2022)   Received from [facility]  Recent Concern: Physical Activity - Inactive (04/18/2022)   Received from [facility]   Social Connections: Socially Isolated (04/18/2022)   Received from [facility]  Stress: No Stress Concern Present (11/05/2022)   Received from [facility]  Tobacco Use: High Risk (10/29/2023)    Readmission Risk Interventions    11/02/2023   10:31 AM 08/25/2022   11:46 AM  Readmission Risk Prevention Plan  Transportation Screening Complete Complete  PCP or Specialist Appt within 5-7 Days Complete   PCP or Specialist Appt within 3-5 Days  Complete  Home Care Screening Complete   Medication Review (RN CM) Complete   HRI or Home Care Consult  Complete  Social Work Consult for Recovery Care Planning/Counseling  Complete  Palliative Care Screening  Not Applicable  Medication Review Oceanographer)  Complete

## 2023-11-09 NOTE — Progress Notes (Signed)
 NAME:  [name], MRN:  [id number], DOB:  [birth year], LOS: 10 ADMISSION DATE:  10/29/2023, CONSULTATION DATE:  10/30/2023 REFERRING MD: Nettie - EDP, CHIEF COMPLAINT:  AMS x 2 days   History of Present Illness:  58 year old man with OSA/OHS, morbid obesity, D-CHF, and GERD who had UTI and sepsis before leading to AMS per his wife. He has AMS for 2 days, he was found by EMS laying in his own urine and feces from 2 days. He has home aid that came yesterday but she wouldn't let her clean him up. He apparently fell out of his powerchair. Currently, he is altered on BiPAP and he cannot provide hx.   Pertinent Medical History:  OSA/OHS, morbid obesity, D-CHF, GERD, UTI sepsis   Significant Hospital Events: Including procedures, antibiotic start and stop dates in addition to other pertinent events   7/18: Admit to ICU. Given 1.5 L LR, Vanco, Flagyl , and Cefepime  in ED.   7/20: Antibiotics continued, getting a lactulose , cultures negative, interactive, requiring increased stimulation overnight in order to respond 7/21: Requested pain medication and received this around 1 AM, received Percocet.  Requiring sternal rub to respond.  Noted to have snuff in his mouth, with episode of desaturation noted by nursing staff.  Barely responsive in the morning, arterial blood gas obtained On BiPAP pH is 7.18 PCO2 of greater than 123, O2 94 bicarbonate 50.  Daytime team contacted on arrival patient on BiPAP, he will open his eyes, he can follow commands but has bilateral lateral Asterix.  Rapidly falls back asleep, intubated, ABX changed to Unasyn . Started aggressive diuresis. Radial a line placed.  7/22: Some bradycardia during evening.  Bowel movement achieved after bowel regimen initiated.  Started ketamine  infusion at 0.5 mg/kg/h, required low-dose clonazepam  with concern about hallucination.  Continued aggressive diuresis, weaning oxygen  7/23: Fentanyl  discontinued ketamine  decreased to 0.3 mg/kg/hr,  clonazepam  discontinued with ketamine  being at subdissociation dosing, Lasix  continued.  Extubated.  Interim History / Subjective:  No significant events  Tolerating NIV overnight without issues Mental status significantly improved Feeling better overall, breathing feels better as well, wakefulness improved PFTs from 2021 demonstrate restrictive lung disease  Objective:    Blood pressure 135/71, pulse (!) 59, temperature 98.2 F (36.8 C), temperature source Oral, resp. rate 14, height 5' 11 (1.803 m), weight (!) 160 kg, SpO2 96%.    FiO2 (%):  [40 %] 40 % PEEP:  [5 cmH20] 5 cmH20 Pressure Support:  [10 cmH20] 10 cmH20   Intake/Output Summary (Last 24 hours) at 11/09/2023 0854 Last data filed at 11/09/2023 0328 Gross per 24 hour  Intake 240 ml  Output 2350 ml  Net -2110 ml   Filed Weights   11/07/23 0417 11/08/23 0300 11/09/23 0500  Weight: (!) 164.2 kg (!) 160.9 kg (!) 160 kg   Physical Examination: General: Chronically ill-appearing middle-aged man in NAD. Pleasant and conversant. HEENT: [place]/AT, anicteric sclera, PERRL, moist mucous membranes. Neuro: Awake, oriented x 4. Responds to verbal stimuli. Following commands consistently. Moves all 4 extremities spontaneously. Generalized weakness. CV: RRR, no m/g/r. PULM: Breathing even and unlabored on 3L [place] HFNC. Lung fields diminished bilaterally at bases, otherwise clear. GI: Obese, soft, nontender, nondistended. Normoactive bowel sounds. Extremities: Bilateral symmetric 1+ nonpitting LE edema noted. Skin: Warm/dry, no rashes.  Resolved Problem List:  Hyperammonemia, most likely passive congestion related  Assessment and Plan:   Acute metabolic encephalopathy secondary to sepsis, and acute on chronic hypercarbic respiratory failure,  Status post fall (PTA), has  h/o chronic pain w/ intrathecal pain pump Clearly baseline CO2 retention, with acute-on-chronic hypercarbic respiratory failure driving his encephalopathy as  well, much improved with NIV use. - Continue NIV at bedtime and PRN, would benefit from Trilogy device for home use - Baseline pain management with intrathecal Fentanyl  pump in place - PT/OT, may benefit from home health therapies  Acute-on-chronic hypercarbic and hypoxic respiratory failure secondary to decompensated OSA with OHS, presumed group 3 pulmonary hypertension, and decompensated diastolic heart failure Baseline CO2 calculates to ~ 70.  Failure is felt to be multifactorial. Narcotics, possible aspiration, overload. I think his chronic pain needs definitely contributed. Has tolerated diuresis well, net negative 12L/admission. - Diuresis as tolerated (to be continued at home, previously on Lasix ) - Will require Trilogy device for home for NIV; this is necessary for management of hypercarbia in the seittng of known restrictive lung disease (demonstrated by PFTs 2021) - NIV support QHS + PRN - Goal O2 sat > 90% - Pulmonary hygiene  E. coli UTI with greater than 100,000 colonies - S/p Unasyn  course  Intermittent fluid and Electrolyte imbalance secondary to aggressive diuresis - Stabilized  Hyperglycemia Excellent glycemic control.  - SSI - CBGs ACHS - Goal CBG 140-180  Diarrhea, improving - Bowel regimen  Best Practice (right click and Reselect all SmartList Selections daily)   Per Primary Team  Signature:   Corean CHRISTELLA Vinayak Bobier, PA-C [facility] 11/09/23 8:54 AM  Please see [email or web address] for pager details.  From 7A-7P if no response, please call [phone] After hours, please call ELink [phone]

## 2023-11-09 NOTE — Progress Notes (Addendum)
 SATURATION QUALIFICATIONS: (This note is used to comply with regulatory documentation for home oxygen )  Patient Saturations on Room Air at Rest = 83% Patient Saturations on 2Lat Rest = 95%  Patient Saturations on Room Air while Ambulating = Not applicable   Patient Saturations on  N/A Liters of oxygen  while Ambulating = Not applicable   Please briefly explain why patient needs home oxygen :  Patient is unable to ambulate due to being wheelchair bound at home. Can only stand to pivot with assistance in short intervals

## 2023-11-09 NOTE — Telephone Encounter (Signed)
 I called and spoke with the pt and advised that I have ordered CPAP with o2  He verbalized understanding  He declined scheduling f/u at this time and says he will have his spouse call to do this for him later  He is aware that the appt needs to be 31-90 days after starts using his machine

## 2023-11-09 NOTE — Progress Notes (Signed)
 Physical Therapy Treatment Patient Details Name: [name] MRN: [id number] DOB: [birth year] Today's Date: 11/09/2023   History of Present Illness 58 yr old male patient  for   AMS for 2 days, he was found by EMS laying in his own urine and feces from 2 days. He apparently fell out of his powerchair, transferred to ICU for profound respiratory distress, hypoxia, and concern for worsening sepsis -ultimately ended up requiring intubation for respiratory support , Extubated 11/04/23. -PMH: chronic pain management, intrathecal pump , DMII, morbid obesity, OSA/OHS, D-CHF, GERD    PT Comments  Pt seen in ICU RM# [id number] AxO x 4 pleasant and willing.  Lives home with Spouse.  He uses a power scooter due to back issues and bad knees but was able to self transfer at home.  He also is on chronic Home oxygen  2.5 lts.  He also has a hopsital bed at home. Assisted OOB required increased time.  General bed mobility comments: patient able to roll to right side and with momentum,  sit upright with mod assist using bed rail.  Pt does have a hospital bed at home.  Required increased time sitting EOB due to mild dizziness. General transfer comment: Pt was able to self rise from elevated hospital but immediately c/o increased low back pain.  Pt states the pain is sharpe and travels down his R hip.  It just grabs me, stated Pt.  Pt required increased asisst from lower recliner level using forward momentum and increased effort.   Assisted from a regular recliner to a bariatric recliner.  That's better, stated Pt. Back pain is most painful with sit to stand and stand to sit.  Pt was able to support his weight with standing and able to take a few side steps but heavy lean/support on walker and recliner arm rests. LPT has rec SNF however Pt declines and wishes to return home.  Will need HH PT.   If plan is discharge home, recommend the following: Two people to help with walking and/or transfers;Two people to help with  bathing/dressing/bathroom;Direct supervision/assist for financial management;Assist for transportation;Help with stairs or ramp for entrance   Can travel by private vehicle     No  Equipment Recommendations  None recommended by PT    Recommendations for Other Services       Precautions / Restrictions Precautions Precautions: Fall Precaution/Restrictions Comments: Home oygen 2.5 lts, x 4 back surgeries Restrictions Weight Bearing Restrictions Per Provider Order: No     Mobility  Bed Mobility Overal bed mobility: Needs Assistance Bed Mobility: Supine to Sit Rolling: Mod assist, +2 for physical assistance, +2 for safety/equipment, Used rails Sidelying to sit: +2 for safety/equipment, +2 for physical assistance, Mod assist       General bed mobility comments: patient able to roll to right side and with momentum,  sit upright with mod assist using bed rail.  Pt does have a hospital bed at home.  Required increased time sitting EOB due to mild dizziness.    Transfers Overall transfer level: Needs assistance Equipment used: Rolling walker (2 wheels) Transfers: Sit to/from Stand Sit to Stand: Supervision, Contact guard assist           General transfer comment: Pt was able to self rise from elevated hospital but immediately c/o increased low back pain.  Pt states the pain is sharpe and travels down his R hip.  It just grabs me, stated Pt.  Pt required increased asisst from lower recliner level using  forward momentum and increased effort.   Assisted from a regular recliner to a bariatric recliner.  That's better, stated Pt. Back pain is most painful with sit to stand and stand to sit.  Pt was able to support his weight with standing and able to take a few side steps but heavy lean/support on walker and recliner arm rests.    Ambulation/Gait               General Gait Details: non amb (just a few side steps) mainly uses a Poer scooter/chair.   Stairs              Wheelchair Mobility     Tilt Bed    Modified Rankin (Stroke Patients Only)       Balance                                            Communication Communication Communication: No apparent difficulties  Cognition Arousal: Alert     PT - Cognitive impairments: No apparent impairments                       PT - Cognition Comments: AxO x 4 pleasant and willing.  Lives home with Spouse.  He uses a power scooter due to back issues and bad knees but was able to self transfer at home.  He also is on chronic Home oxygen  2.5 lts.  He also has a hopsital bed at home. Following commands: Intact      Cueing Cueing Techniques: Verbal cues  Exercises      General Comments        Pertinent Vitals/Pain Pain Assessment Pain Assessment: Faces Faces Pain Scale: Hurts even more Pain Location: chronic back pain esp when standing Pain Descriptors / Indicators: Contraction, Discomfort, Guarding, Grimacing, Moaning Pain Intervention(s): Monitored during session, Repositioned    Home Living                          Prior Function            PT Goals (current goals can now be found in the care plan section) Progress towards PT goals: Progressing toward goals    Frequency    Min 2X/week      PT Plan      Co-evaluation              AM-PAC PT 6 Clicks Mobility   Outcome Measure  Help needed turning from your back to your side while in a flat bed without using bedrails?: A Lot Help needed moving from lying on your back to sitting on the side of a flat bed without using bedrails?: A Lot Help needed moving to and from a bed to a chair (including a wheelchair)?: A Lot Help needed standing up from a chair using your arms (e.g., wheelchair or bedside chair)?: A Lot Help needed to walk in hospital room?: Total Help needed climbing 3-5 steps with a railing? : Total 6 Click Score: 10    End of Session Equipment Utilized During  Treatment: Gait belt Activity Tolerance: Patient limited by pain Patient left: in chair;with call bell/phone within reach Nurse Communication: Mobility status PT Visit Diagnosis: Unsteadiness on feet (R26.81);Repeated falls (R29.6);Muscle weakness (generalized) (M62.81);Other symptoms and signs involving the nervous system (M70.101)     Time: 8969-8944  PT Time Calculation (min) (ACUTE ONLY): 25 min  Charges:    $Therapeutic Activity: 23-37 mins PT General Charges $$ ACUTE PT VISIT: 1 Visit                     Katheryn Leap  PTA Acute  Rehabilitation Services Office M-F          [phone]

## 2023-11-09 NOTE — TOC Transition Note (Signed)
 Transition of Care [facility]) - Discharge Note   Patient Details  Name: [name] MRN: [id number] Date of Birth: February 21, 1966  Transition [facility]) CM/SW Contact:  Brian ONEIDA Anon, [name] Phone Number: 11/09/2023, 3:33 PM   Clinical Narrative:    Pt discharging home to receive home health services, with home oxygen . Pt has home oxygen  set up through [facility]. Travel tank being sent by [facility] for pt to travel home. Pt son/daughter will provide transportation at DC, pt spouse confirms. HHPT/OT setup through [facility] with rep Brian. HH orders are in place. There are no other TOC needs at this time.   Final next level of care: Home w Home Health Services Barriers to Discharge: No Barriers Identified   Patient Goals and CMS Choice Patient states their goals for this hospitalization and ongoing recovery are:: Home with home health services CMS Medicare.gov Compare Post Acute Care list provided to:: Patient Choice offered to / list presented to : Patient [place] ownership interest in [facility].provided to:: Patient    Discharge Placement                Patient to be transferred to facility by: [name] (Spouse)  [phone]      Discharge Plan and Services Additional resources added to the After Visit Summary for   In-house Referral: NA Discharge Planning Services: CM Consult Post Acute Care Choice: Home Health          DME Arranged: Oxygen  DME Agency: [facility] Date DME Agency Contacted: 11/09/23 Time DME Agency Contacted: [phone] Representative spoke with at DME Agency: [name] with [employer] HH Arranged: PT, OT HH Agency: [facility] Date [facility] Agency Contacted: 11/09/23 Time HH Agency Contacted: 1435 Representative spoke with at [facility] Agency: [name]  Social Drivers of Health (SDOH) Interventions SDOH Screenings   Food Insecurity: No Food Insecurity (08/12/2022)   Received from [facility]  Housing: Low Risk  (04/01/2022)   Transportation Needs: No Transportation Needs (08/12/2022)   Received from [facility]  Utilities: Not At Risk (08/12/2022)   Received from [facility]  Depression (PHQ2-9): Low Risk  (03/19/2021)  Financial Resource Strain: Medium Risk (04/18/2022)   Received from [facility]  Physical Activity: Unknown (04/18/2022)   Received from [facility]  Recent Concern: Physical Activity - Inactive (04/18/2022)   Received from [facility]  Social Connections: Socially Isolated (04/18/2022)   Received from [facility]  Stress: No Stress Concern Present (11/05/2022)   Received from [facility]  Tobacco Use: High Risk (10/29/2023)     Readmission Risk Interventions    11/02/2023   10:31 AM 08/25/2022   11:46 AM  Readmission Risk Prevention Plan  Transportation Screening Complete Complete  PCP or Specialist Appt within 5-7 Days Complete   PCP or Specialist Appt within 3-5 Days  Complete  Home Care Screening Complete   Medication Review ([name] CM) Complete   HRI or Home Care Consult  Complete  Social Work Consult for Recovery Care Planning/Counseling  Complete  Palliative Care Screening  Not Applicable  Medication Review [employer])  Complete

## 2023-11-10 ENCOUNTER — Other Ambulatory Visit (HOSPITAL_COMMUNITY): Payer: Self-pay

## 2023-11-11 ENCOUNTER — Telehealth: Payer: Self-pay | Admitting: Internal Medicine

## 2023-11-11 DIAGNOSIS — I5032 Chronic diastolic (congestive) heart failure: Secondary | ICD-10-CM | POA: Diagnosis not present

## 2023-11-11 DIAGNOSIS — E1169 Type 2 diabetes mellitus with other specified complication: Secondary | ICD-10-CM | POA: Diagnosis not present

## 2023-11-11 DIAGNOSIS — E1142 Type 2 diabetes mellitus with diabetic polyneuropathy: Secondary | ICD-10-CM | POA: Diagnosis not present

## 2023-11-11 DIAGNOSIS — F1020 Alcohol dependence, uncomplicated: Secondary | ICD-10-CM | POA: Diagnosis not present

## 2023-11-11 DIAGNOSIS — J9622 Acute and chronic respiratory failure with hypercapnia: Secondary | ICD-10-CM | POA: Diagnosis not present

## 2023-11-11 DIAGNOSIS — F321 Major depressive disorder, single episode, moderate: Secondary | ICD-10-CM | POA: Diagnosis not present

## 2023-11-11 DIAGNOSIS — E7220 Disorder of urea cycle metabolism, unspecified: Secondary | ICD-10-CM | POA: Diagnosis not present

## 2023-11-11 DIAGNOSIS — J9621 Acute and chronic respiratory failure with hypoxia: Secondary | ICD-10-CM | POA: Diagnosis not present

## 2023-11-11 NOTE — Telephone Encounter (Signed)
 Duplicate, see note from 11/09/2023.  Closing encounter.

## 2023-11-11 NOTE — Telephone Encounter (Signed)
 Copied from CRM [id number]. Topic: Clinical - Order For Equipment >> Nov 11, 2023  3:01 PM Corean SAUNDERS wrote: Reason for CRM: Patients wife Versa) states Dr. Meade advised the patient that she will be sending an order for a Bipap machine to [facility] but states [facility] has not received the order. Barnie is very frustrated as the patient needs the machine right away and is worried he will end up in the hospital again without it. Please send the order to [facility] and call patient or wife when this is complete.

## 2023-11-12 ENCOUNTER — Telehealth: Payer: Self-pay | Admitting: Internal Medicine

## 2023-11-12 DIAGNOSIS — G4733 Obstructive sleep apnea (adult) (pediatric): Secondary | ICD-10-CM

## 2023-11-12 NOTE — Telephone Encounter (Signed)
 Spoke with Mitch, attempted to get NIV for patient when patient was in the hospital, but patient left without completing PFT. Mitch will push through CPAP order with HST and Titration, will let us  know if there is any issues. Informed patient.

## 2023-11-12 NOTE — Telephone Encounter (Signed)
 Symptoms of not snoring is not enough to say he does not have sleep apnea any longer Weight loss also does not mean he does not have sleep apnea any longer  Needs a sleep study-best study will be an in lab study Pulmonary function test  Will only qualify for NIV if study is negative for significant sleep apnea and would need the NIV for hypercapnic respiratory failure-CO2 retention

## 2023-11-12 NOTE — Telephone Encounter (Signed)
 Spoke with patients wife, Barnie, who states [facility] does not have CPAP order. Patient is very frustrated and needs machine asap. Emailed Mitch with [employer] to get status update.

## 2023-11-12 NOTE — Telephone Encounter (Signed)
 Copied from CRM [id number]. Topic: Clinical - Order For Equipment >> Nov 11, 2023  5:04 PM [name] wrote: Reason for CRM: Pt's spouse [name] called in reference to the pt's CPAP order. She wanted an update on the order. I called CAL as they have called multiple times since 7/28. CAL verified the order had been placed on 7/28 and this was relayed to the pt as well.   I asked how long it typically takes for an order to be completed for a patient and the practice leader relayed that it can take up to 1-2 weeks fully. I relayed this to [name], but she said [facility] stated there was no order received. I further explained that's because we are waiting on the pt's insurance, essentially the middle man in this scenario, to approve of the order, and send it to [facility]. [name] understood, and stated she would be calling the insurance provider to complain and she would call back tomorrow with what she found out.

## 2023-11-12 NOTE — Telephone Encounter (Signed)
 [facility] has received the order.   New, [name], [name]; New, [name]; [name], Brian; [name], Brian; [name], Brian; 1 other Received, thank you!       Previous Messages    ----- Message ----- From: [name] SAILOR Sent: 11/12/2023   9:46 AM EDT To: Brian Leer; Avelina Sprung; Ephraim Dollar* Subject: CPAP                                          DOB:[birth year] Order placed DESAI, NIKITA S please advise. Thanks!

## 2023-11-12 NOTE — Telephone Encounter (Signed)
 Spoke with Thomasina, states that no notes support benefit from CPAP from past usage, last office visit notes from 10/2022 state, Patient intolerant to CPAP. Recently lost more than 70 pounds. He is sleeping better and no longer snoring .  Thomasina states patient will need a new sleep study to see if he qualifies or patient will need a PFT to qualify for NIV.   Please advise on next steps.

## 2023-11-12 NOTE — Telephone Encounter (Signed)
 PT calling upset wanting the office manager. Con will call her back.

## 2023-11-13 DIAGNOSIS — F321 Major depressive disorder, single episode, moderate: Secondary | ICD-10-CM | POA: Diagnosis not present

## 2023-11-13 DIAGNOSIS — E7220 Disorder of urea cycle metabolism, unspecified: Secondary | ICD-10-CM | POA: Diagnosis not present

## 2023-11-13 DIAGNOSIS — J9621 Acute and chronic respiratory failure with hypoxia: Secondary | ICD-10-CM | POA: Diagnosis not present

## 2023-11-13 DIAGNOSIS — E1142 Type 2 diabetes mellitus with diabetic polyneuropathy: Secondary | ICD-10-CM | POA: Diagnosis not present

## 2023-11-13 DIAGNOSIS — E1169 Type 2 diabetes mellitus with other specified complication: Secondary | ICD-10-CM | POA: Diagnosis not present

## 2023-11-13 DIAGNOSIS — I5032 Chronic diastolic (congestive) heart failure: Secondary | ICD-10-CM | POA: Diagnosis not present

## 2023-11-13 DIAGNOSIS — F1020 Alcohol dependence, uncomplicated: Secondary | ICD-10-CM | POA: Diagnosis not present

## 2023-11-13 DIAGNOSIS — J9622 Acute and chronic respiratory failure with hypercapnia: Secondary | ICD-10-CM | POA: Diagnosis not present

## 2023-11-13 NOTE — Telephone Encounter (Signed)
 Spoke with patient's wife [name] regarding in lab sleep study that has been placed.Patient also has been scheduled for a PFT. And f/u with Dr.Olalere . Nothing else further needed

## 2023-11-16 DIAGNOSIS — E1169 Type 2 diabetes mellitus with other specified complication: Secondary | ICD-10-CM | POA: Diagnosis not present

## 2023-11-16 DIAGNOSIS — F321 Major depressive disorder, single episode, moderate: Secondary | ICD-10-CM | POA: Diagnosis not present

## 2023-11-16 DIAGNOSIS — J9622 Acute and chronic respiratory failure with hypercapnia: Secondary | ICD-10-CM | POA: Diagnosis not present

## 2023-11-16 DIAGNOSIS — E1142 Type 2 diabetes mellitus with diabetic polyneuropathy: Secondary | ICD-10-CM | POA: Diagnosis not present

## 2023-11-16 DIAGNOSIS — J9621 Acute and chronic respiratory failure with hypoxia: Secondary | ICD-10-CM | POA: Diagnosis not present

## 2023-11-16 DIAGNOSIS — E7220 Disorder of urea cycle metabolism, unspecified: Secondary | ICD-10-CM | POA: Diagnosis not present

## 2023-11-16 DIAGNOSIS — F1020 Alcohol dependence, uncomplicated: Secondary | ICD-10-CM | POA: Diagnosis not present

## 2023-11-16 DIAGNOSIS — I5032 Chronic diastolic (congestive) heart failure: Secondary | ICD-10-CM | POA: Diagnosis not present

## 2023-11-17 DIAGNOSIS — E7220 Disorder of urea cycle metabolism, unspecified: Secondary | ICD-10-CM | POA: Diagnosis not present

## 2023-11-17 DIAGNOSIS — F321 Major depressive disorder, single episode, moderate: Secondary | ICD-10-CM | POA: Diagnosis not present

## 2023-11-17 DIAGNOSIS — E1142 Type 2 diabetes mellitus with diabetic polyneuropathy: Secondary | ICD-10-CM | POA: Diagnosis not present

## 2023-11-17 DIAGNOSIS — I5032 Chronic diastolic (congestive) heart failure: Secondary | ICD-10-CM | POA: Diagnosis not present

## 2023-11-17 DIAGNOSIS — J9621 Acute and chronic respiratory failure with hypoxia: Secondary | ICD-10-CM | POA: Diagnosis not present

## 2023-11-17 DIAGNOSIS — J9622 Acute and chronic respiratory failure with hypercapnia: Secondary | ICD-10-CM | POA: Diagnosis not present

## 2023-11-17 DIAGNOSIS — E1169 Type 2 diabetes mellitus with other specified complication: Secondary | ICD-10-CM | POA: Diagnosis not present

## 2023-11-17 DIAGNOSIS — F1020 Alcohol dependence, uncomplicated: Secondary | ICD-10-CM | POA: Diagnosis not present

## 2023-11-19 DIAGNOSIS — E1169 Type 2 diabetes mellitus with other specified complication: Secondary | ICD-10-CM | POA: Diagnosis not present

## 2023-11-19 DIAGNOSIS — J9621 Acute and chronic respiratory failure with hypoxia: Secondary | ICD-10-CM | POA: Diagnosis not present

## 2023-11-19 DIAGNOSIS — J9622 Acute and chronic respiratory failure with hypercapnia: Secondary | ICD-10-CM | POA: Diagnosis not present

## 2023-11-19 DIAGNOSIS — E7220 Disorder of urea cycle metabolism, unspecified: Secondary | ICD-10-CM | POA: Diagnosis not present

## 2023-11-19 DIAGNOSIS — F1020 Alcohol dependence, uncomplicated: Secondary | ICD-10-CM | POA: Diagnosis not present

## 2023-11-19 DIAGNOSIS — F321 Major depressive disorder, single episode, moderate: Secondary | ICD-10-CM | POA: Diagnosis not present

## 2023-11-19 DIAGNOSIS — I5032 Chronic diastolic (congestive) heart failure: Secondary | ICD-10-CM | POA: Diagnosis not present

## 2023-11-19 DIAGNOSIS — E1142 Type 2 diabetes mellitus with diabetic polyneuropathy: Secondary | ICD-10-CM | POA: Diagnosis not present

## 2023-11-25 ENCOUNTER — Telehealth: Payer: Self-pay

## 2023-11-25 DIAGNOSIS — F321 Major depressive disorder, single episode, moderate: Secondary | ICD-10-CM | POA: Diagnosis not present

## 2023-11-25 DIAGNOSIS — J9621 Acute and chronic respiratory failure with hypoxia: Secondary | ICD-10-CM | POA: Diagnosis not present

## 2023-11-25 DIAGNOSIS — F1020 Alcohol dependence, uncomplicated: Secondary | ICD-10-CM | POA: Diagnosis not present

## 2023-11-25 DIAGNOSIS — E1142 Type 2 diabetes mellitus with diabetic polyneuropathy: Secondary | ICD-10-CM | POA: Diagnosis not present

## 2023-11-25 DIAGNOSIS — J9622 Acute and chronic respiratory failure with hypercapnia: Secondary | ICD-10-CM | POA: Diagnosis not present

## 2023-11-25 DIAGNOSIS — E7220 Disorder of urea cycle metabolism, unspecified: Secondary | ICD-10-CM | POA: Diagnosis not present

## 2023-11-25 DIAGNOSIS — E1169 Type 2 diabetes mellitus with other specified complication: Secondary | ICD-10-CM | POA: Diagnosis not present

## 2023-11-25 DIAGNOSIS — I5032 Chronic diastolic (congestive) heart failure: Secondary | ICD-10-CM | POA: Diagnosis not present

## 2023-11-25 NOTE — Telephone Encounter (Signed)
 CMN faxed back to [facility]

## 2023-12-01 ENCOUNTER — Encounter

## 2023-12-01 ENCOUNTER — Other Ambulatory Visit: Payer: Self-pay

## 2023-12-01 DIAGNOSIS — R0602 Shortness of breath: Secondary | ICD-10-CM

## 2023-12-04 ENCOUNTER — Telehealth: Payer: Self-pay

## 2023-12-07 DIAGNOSIS — E1169 Type 2 diabetes mellitus with other specified complication: Secondary | ICD-10-CM | POA: Diagnosis not present

## 2023-12-07 DIAGNOSIS — E1142 Type 2 diabetes mellitus with diabetic polyneuropathy: Secondary | ICD-10-CM | POA: Diagnosis not present

## 2023-12-07 DIAGNOSIS — E7220 Disorder of urea cycle metabolism, unspecified: Secondary | ICD-10-CM | POA: Diagnosis not present

## 2023-12-07 DIAGNOSIS — I5032 Chronic diastolic (congestive) heart failure: Secondary | ICD-10-CM | POA: Diagnosis not present

## 2023-12-07 DIAGNOSIS — J9622 Acute and chronic respiratory failure with hypercapnia: Secondary | ICD-10-CM | POA: Diagnosis not present

## 2023-12-07 DIAGNOSIS — F321 Major depressive disorder, single episode, moderate: Secondary | ICD-10-CM | POA: Diagnosis not present

## 2023-12-07 DIAGNOSIS — J9621 Acute and chronic respiratory failure with hypoxia: Secondary | ICD-10-CM | POA: Diagnosis not present

## 2023-12-07 DIAGNOSIS — F1020 Alcohol dependence, uncomplicated: Secondary | ICD-10-CM | POA: Diagnosis not present

## 2023-12-09 DIAGNOSIS — I5032 Chronic diastolic (congestive) heart failure: Secondary | ICD-10-CM | POA: Diagnosis not present

## 2023-12-09 DIAGNOSIS — F321 Major depressive disorder, single episode, moderate: Secondary | ICD-10-CM | POA: Diagnosis not present

## 2023-12-09 DIAGNOSIS — E1169 Type 2 diabetes mellitus with other specified complication: Secondary | ICD-10-CM | POA: Diagnosis not present

## 2023-12-09 DIAGNOSIS — J9622 Acute and chronic respiratory failure with hypercapnia: Secondary | ICD-10-CM | POA: Diagnosis not present

## 2023-12-09 DIAGNOSIS — F1020 Alcohol dependence, uncomplicated: Secondary | ICD-10-CM | POA: Diagnosis not present

## 2023-12-09 DIAGNOSIS — E1142 Type 2 diabetes mellitus with diabetic polyneuropathy: Secondary | ICD-10-CM | POA: Diagnosis not present

## 2023-12-09 DIAGNOSIS — E7220 Disorder of urea cycle metabolism, unspecified: Secondary | ICD-10-CM | POA: Diagnosis not present

## 2023-12-09 DIAGNOSIS — J9621 Acute and chronic respiratory failure with hypoxia: Secondary | ICD-10-CM | POA: Diagnosis not present

## 2023-12-21 ENCOUNTER — Ambulatory Visit (HOSPITAL_BASED_OUTPATIENT_CLINIC_OR_DEPARTMENT_OTHER): Attending: Pulmonary Disease | Admitting: Pulmonary Disease

## 2023-12-21 ENCOUNTER — Ambulatory Visit: Admitting: Pulmonary Disease

## 2024-02-18 ENCOUNTER — Ambulatory Visit: Admitting: Podiatry

## 2024-02-18 DIAGNOSIS — M79674 Pain in right toe(s): Secondary | ICD-10-CM

## 2024-02-18 DIAGNOSIS — E1142 Type 2 diabetes mellitus with diabetic polyneuropathy: Secondary | ICD-10-CM | POA: Diagnosis not present

## 2024-02-18 DIAGNOSIS — M79675 Pain in left toe(s): Secondary | ICD-10-CM | POA: Diagnosis not present

## 2024-02-18 DIAGNOSIS — B351 Tinea unguium: Secondary | ICD-10-CM

## 2024-02-18 NOTE — Progress Notes (Signed)
  Subjective:  Patient ID: [name], male    DOB: 12/22/1965,   MRN: [id number]  Chief Complaint  Patient presents with   Diabetes    My big toes on both feet, the toenails cut on the side.  Saw Dr. Alm Rav - 11/11/2023; A1c - 5.2    58 y.o. male presents for concern of thickened elongated and painful nails that are difficult to trim. Requesting to have them trimmed today. Relates burning and tingling in their feet.  Does have a history of back issues.  Currently in pain management for low back pain starting.  Relates neuropathy.  Patient is diabetic and last A1c was  Lab Results  Component Value Date   HGBA1C 5.2 10/30/2023   .   PCP:  Rav Alm, MD     . Denies any other pedal complaints. Denies n/v/f/c.   Past Medical History:  Diagnosis Date   Arthritis    Asthma    as child   Back pain    CHF (congestive heart failure) (HCC)    COPD (chronic obstructive pulmonary disease) (HCC)    GERD (gastroesophageal reflux disease)    Headache    Hernia    History of hiatal hernia    History of kidney stones    Hypogonadism in male    Neuromuscular disorder (HCC)    back injury with surgery   On home oxygen  therapy    PONV (postoperative nausea and vomiting)    gets nausea   Pre-diabetes    Shortness of breath dyspnea    Sleep apnea    Does not use CPAP, new equipment 12/2014   Ulcer    Wears glasses     Objective:  Physical Exam: Vascular: DP/PT pulses 2/4 bilateral. CFT <3 seconds. Feet cold to touch. Absent hair growth on digits. Edema noted to bilateral lower extremities. Xerosis noted bilaterally.  Skin. No lacerations or abrasions bilateral feet. Nails 1-5 bilateral  are thickened discolored and elongated with subungual debris.  Musculoskeletal: MMT 5/5 bilateral lower extremities in DF, PF, Inversion and Eversion. Deceased ROM in DF of ankle joint.  Neurological: Sensation intact to light touch. Protective sensation diminished bilateral.     Assessment:  No diagnosis found.   Plan:  Patient was evaluated and treated and all questions answered. -Discussed and educated patient on diabetic foot care, especially with  regards to the vascular, neurological and musculoskeletal systems.  -Stressed the importance of good glycemic control and the detriment of not  controlling glucose levels in relation to the foot. -Discussed supportive shoes at all times and checking feet regularly.  -Mechanically debrided all nails 1-5 bilateral using sterile nail nipper and filed with dremel without incident  -Answered all patient questions -Patient to return  in 3 months for at risk foot care -Patient advised to call the office if any problems or questions arise in the meantime.   Asberry Failing, DPM

## 2024-02-25 ENCOUNTER — Encounter: Payer: Self-pay | Admitting: Pulmonary Disease

## 2024-02-25 ENCOUNTER — Ambulatory Visit: Admitting: Pulmonary Disease

## 2024-02-25 VITALS — BP 116/58 | HR 98 | Ht 71.0 in | Wt 360.0 lb

## 2024-02-25 DIAGNOSIS — R0683 Snoring: Secondary | ICD-10-CM

## 2024-02-25 DIAGNOSIS — R0602 Shortness of breath: Secondary | ICD-10-CM | POA: Diagnosis not present

## 2024-02-25 NOTE — Patient Instructions (Addendum)
 Schedule for pulmonary function test  Scheduled for sleep study-schedule split-night study  We want a make sure that your pain is well-controlled prior to having the studies done  If you are not able to get good enough sleep because you are in so much pain then the study may not be optimal enough  Tentative follow-up in 3 months  Call us  with significant concerns

## 2024-02-25 NOTE — Progress Notes (Signed)
 [name]    [id number]    08-18-1965  Primary Care Physician:Swayne, Alm, MD  Referring Physician: Seabron Alm, MD [phone] [address] [place],  [place] [address]  Chief complaint:   Patient being seen for concern for sleep disordered breathing  HPI:  Shortness of breath at rest     suffers from chronic back pain Recently had a fentanyl  pump placed  Had a sleep study over 15 years ago History of congestive heart failure 6-7 years  Has not been able to walk for couple years from chronic back pain back injury 1984  Usually goes to bed about 11, falls asleep quickly wakes up about 3 to 4 AM Feels rested when he wakes up in the morning He may feel sleepy during the day on days that he does not sleep well Denies any significant dryness of his mouth in the morning Occasional headaches He does not usually take naps  Never smoked cigarettes  He is on oxygen  supplementation  Admits to snoring  Gaining weight due to water retention  History of diabetes, hypertension, heart failure He has no underlying lung disease asthmatic bronchitis in the past  Outpatient Encounter Medications as of 02/25/2024  Medication Sig   acetaminophen  (TYLENOL ) 500 MG tablet Take 1,000 mg by mouth every 6 (six) hours as needed for mild pain or moderate pain.    DULoxetine  (CYMBALTA ) 30 MG capsule Take 90 mg by mouth daily.   esomeprazole  (NEXIUM ) 40 MG capsule Take 1 capsule (40 mg total) by mouth every morning.   fentaNYL  (SUBLIMAZE ) 100 MCG/2ML injection Inject into the vein continuous.   furosemide  (LASIX ) 20 MG tablet Take 2 tablets (40 mg total) by mouth daily.   naloxone  (NARCAN ) nasal spray 4 mg/0.1 mL Place 1 spray into the nose as needed for up to 365 doses (for opioid-induced respiratory depresssion). In case of emergency (overdose), spray once into each nostril. If no response within 3 minutes, repeat application and call 911.   nystatin  (MYCOSTATIN /NYSTOP )  powder Apply topically 2 (two) times daily. (Patient taking differently: Apply 1 Application topically daily.)   PRESCRIPTION MEDICATION continuous. Fentanyl  pump- unknown dose   No facility-administered encounter medications on file as of 02/25/2024.    Allergies as of 02/25/2024 - Review Complete 02/25/2024  Allergen Reaction Noted   Tape Other (See Comments) 08/16/2013    Past Medical History:  Diagnosis Date   Arthritis    Asthma    as child   Back pain    CHF (congestive heart failure) (HCC)    COPD (chronic obstructive pulmonary disease) (HCC)    GERD (gastroesophageal reflux disease)    Headache    Hernia    History of hiatal hernia    History of kidney stones    Hypogonadism in male    Neuromuscular disorder (HCC)    back injury with surgery   On home oxygen  therapy    PONV (postoperative nausea and vomiting)    gets nausea   Pre-diabetes    Shortness of breath dyspnea    Sleep apnea    Does not use CPAP, new equipment 12/2014   Ulcer    Wears glasses     Past Surgical History:  Procedure Laterality Date   BACK SURGERY     CYSTOSCOPY W/ URETERAL STENT PLACEMENT Left 03/31/2022   Procedure: CYSTOSCOPY WITH RETROGRADE PYELOGRAM/URETERAL STENT PLACEMENT;  Surgeon: Devere Lonni Righter, MD;  Location: [facility];  Service: Urology;  Laterality: Left;   CYSTOSCOPY/URETEROSCOPY/HOLMIUM LASER/STENT PLACEMENT Bilateral 07/02/2022   Procedure: CYSTOSCOPY, LEFT STENT REMOVAL, BILATERAL URETEROSCOPY, HOLMIUM LASER, AND RIGHT STENT PLACEMENT;  Surgeon: Devere Lonni Righter, MD;  Location: [facility];  Service: Urology;  Laterality: Bilateral;  90 MINUTES   JOINT REPLACEMENT     Left knee replacement   LAPAROSCOPIC GASTRIC SLEEVE RESECTION N/A 01/08/2015   Procedure: LAPAROSCOPIC GASTRIC SLEEVE RESECTION;  Surgeon: Donnice Lunger, MD;  Location: [facility];  Service: General;  Laterality: N/A;   morphine  pump     RIGHT HEART CATH N/A 09/22/2019   Procedure: RIGHT HEART CATH;   Surgeon: Cherrie Toribio SAUNDERS, MD;  Location: [facility];  Service: Cardiovascular;  Laterality: N/A;   SHOULDER ARTHROSCOPY WITH SUBACROMIAL DECOMPRESSION AND BICEP TENDON REPAIR Left 10/06/2013   Procedure: LEFT SHOULDER ARTHROSCOPY DEBRIDEMENT EXTENTSIVE,DISTAL CLAVICULECTOMY,DECOMPRESSION SUBACROMIAL PARTIAL ACROMIOPLASTY WITH ROTATOR CUFF REPAIR, and excision of CALCIUM  DEPOSIT;  Surgeon: Toribio JULIANNA Chancy, MD;  Location: [facility];  Service: Orthopedics;  Laterality: Left;   SHOULDER ARTHROSCOPY WITH SUBACROMIAL DECOMPRESSION, ROTATOR CUFF REPAIR AND BICEP TENDON REPAIR Right 08/18/2013   Procedure: RIGHT SHOULDER ARTHROSCOPY WITH SUBACROMIAL DECOMPRESSION/PARTIAL ACROMIOPLASTY WITH CORACOACROMIAL RELEASE/DISTAL CLAVICULECTOMY/ ROTATOR CUFF REPAIR/DEBRIDEMENT EXTENTSIVE;  Surgeon: Toribio JULIANNA Chancy, MD;  Location: [facility];  Service: Orthopedics;  Laterality: Right;  ANESTHESIA: GENERAL, PRE/POST OP SCALENE   TONSILLECTOMY      Family History  Problem Relation Age of Onset   Diabetes Mother    Pneumonia Father     Social History   Socioeconomic History   Marital status: Married    Spouse name: Not on file   Number of children: Not on file   Years of education: Not on file   Highest education level: Not on file  Occupational History   Not on file  Tobacco Use   Smoking status: Never   Smokeless tobacco: Current    Types: Snuff, Chew  Vaping Use   Vaping status: Never Used  Substance and Sexual Activity   Alcohol use: Yes    Comment: 2-3 beers per day, starting in Oct 2019.   Drug use: No   Sexual activity: Yes  Other Topics Concern   Not on file  Social History Narrative   Not on file   Social Drivers of Health   Financial Resource Strain: Medium Risk (04/18/2022)   Received from [facility]   Overall Financial Resource Strain (CARDIA)    Difficulty of Paying Living Expenses: Somewhat hard  Food Insecurity: No Food Insecurity  (08/12/2022)   Received from [facility]   Hunger Vital Sign    Within the past 12 months, you worried that your food would run out before you got the money to buy more.: Never true    Within the past 12 months, the food you bought just didn't last and you didn't have money to get more.: Never true  Transportation Needs: No Transportation Needs (08/12/2022)   Received from [facility] - Transportation    Lack of Transportation (Medical): No    Lack of Transportation (Non-Medical): No  Physical Activity: Unknown (04/18/2022)   Received from [facility]   Exercise Vital Sign    On average, how many days per week do you engage in moderate to strenuous exercise (like a brisk walk)?: 0 days    Minutes of Exercise per Session: Not on file  Recent Concern: Physical Activity - Inactive (04/18/2022)   Received from [facility]   Exercise Vital  Sign    Days of Exercise per Week: 0 days    Minutes of Exercise per Session: 0 min  Stress: No Stress Concern Present (11/05/2022)   Received from [facility] - Occupational Stress Questionnaire    Feeling of Stress : Not at all  Social Connections: Socially Isolated (04/18/2022)   Received from [facility]   Social Network    How would you rate your social network (family, work, friends)?: Little participation, lonely and socially isolated  Intimate Partner Violence: Not At Risk (11/05/2022)   Received from [facility]   HITS    Over the last 12 months how often did your partner physically hurt you?: Never    Over the last 12 months how often did your partner insult you or talk down to you?: Never    Over the last 12 months how often did your partner threaten you with physical harm?: Never    Over the last 12 months how often did your partner scream or curse at you?: Never    Review of Systems  Constitutional:  Positive for fatigue.  Respiratory:  Positive for apnea and shortness of  breath.   Psychiatric/Behavioral:  Positive for sleep disturbance.     Vitals:   02/25/24 1534  BP: (!) 116/58  Pulse: 98  SpO2: 95%     Physical Exam Constitutional:      Appearance: He is obese.  HENT:     Head: Normocephalic.     Mouth/Throat:     Mouth: Mucous membranes are moist.  Eyes:     General: No scleral icterus. Cardiovascular:     Rate and Rhythm: Normal rate and regular rhythm.     Heart sounds: No murmur heard.    No friction rub.  Pulmonary:     Effort: No respiratory distress.     Breath sounds: No stridor. No wheezing or rhonchi.  Musculoskeletal:     Cervical back: No rigidity or tenderness.  Neurological:     Mental Status: He is alert.  Psychiatric:        Mood and Affect: Mood normal.    Data Reviewed: Recent hospital records from July 2025 reviewed with hypercapnic respiratory failure with pCO2 as high as 123, treated for metabolic encephalopathy, hypercapnic respiratory failure, obesity hypoventilation, decompensated diastolic heart failure  PFT in 2021 with combined severe obstructive airway disease, mild restriction  Assessment/Plan: Obstructive sleep apnea Obesity hypoventilation Hypercapnic respiratory failure Chronic hypoxemic respiratory failure - Patient will require sleep study to ascertain severity of sleep disordered breathing and titrated to optimal pressures May require noninvasive ventilator  Group 3 pulmonary hypertension, diastolic heart failure - Continue diuretics  Will obtain a pulmonary function test  Studies will be scheduled however it is important that pain be adequately managed before the study is done as if he does not sleep well in the sleep lab we will not have an adequate study to guide our decisions  Class III obesity  Tentative follow-up in about 3 months  Encouraged to call with significant concerns  Chronic diastolic heart failure Osteoarthritis of lumbar spine, degenerative spine disease  He has  an intrathecal fentanyl  pump in place    Daxten Kovalenko MD [facility] 02/25/2024, 3:56 PM  CC: Seabron Lenis, MD

## 2024-05-02 ENCOUNTER — Ambulatory Visit (HOSPITAL_BASED_OUTPATIENT_CLINIC_OR_DEPARTMENT_OTHER): Admitting: Pulmonary Disease

## 2024-05-15 ENCOUNTER — Other Ambulatory Visit: Payer: Self-pay

## 2024-05-15 ENCOUNTER — Emergency Department (HOSPITAL_COMMUNITY)

## 2024-05-15 ENCOUNTER — Encounter (HOSPITAL_COMMUNITY): Payer: Self-pay

## 2024-05-15 ENCOUNTER — Inpatient Hospital Stay (HOSPITAL_COMMUNITY)
Admission: EM | Admit: 2024-05-15 | Discharge: 2024-05-18 | Disposition: A | Source: Home / Self Care | Attending: Internal Medicine | Admitting: Internal Medicine

## 2024-05-15 DIAGNOSIS — G894 Chronic pain syndrome: Secondary | ICD-10-CM | POA: Diagnosis present

## 2024-05-15 DIAGNOSIS — I5032 Chronic diastolic (congestive) heart failure: Secondary | ICD-10-CM | POA: Diagnosis present

## 2024-05-15 DIAGNOSIS — R0689 Other abnormalities of breathing: Principal | ICD-10-CM

## 2024-05-15 DIAGNOSIS — N390 Urinary tract infection, site not specified: Secondary | ICD-10-CM | POA: Insufficient documentation

## 2024-05-15 DIAGNOSIS — J9622 Acute and chronic respiratory failure with hypercapnia: Secondary | ICD-10-CM | POA: Diagnosis present

## 2024-05-15 DIAGNOSIS — J189 Pneumonia, unspecified organism: Secondary | ICD-10-CM

## 2024-05-15 DIAGNOSIS — J9621 Acute and chronic respiratory failure with hypoxia: Secondary | ICD-10-CM | POA: Diagnosis present

## 2024-05-15 DIAGNOSIS — G9341 Metabolic encephalopathy: Secondary | ICD-10-CM | POA: Diagnosis present

## 2024-05-15 DIAGNOSIS — G4733 Obstructive sleep apnea (adult) (pediatric): Secondary | ICD-10-CM | POA: Diagnosis present

## 2024-05-15 LAB — COMPREHENSIVE METABOLIC PANEL WITH GFR
ALT: 7 U/L (ref 0–44)
AST: 14 U/L — ABNORMAL LOW (ref 15–41)
Albumin: 3.6 g/dL (ref 3.5–5.0)
Alkaline Phosphatase: 83 U/L (ref 38–126)
Anion gap: 5 (ref 5–15)
BUN: 9 mg/dL (ref 6–20)
CO2: 41 mmol/L — ABNORMAL HIGH (ref 22–32)
Calcium: 9.7 mg/dL (ref 8.9–10.3)
Chloride: 92 mmol/L — ABNORMAL LOW (ref 98–111)
Creatinine, Ser: 0.6 mg/dL — ABNORMAL LOW (ref 0.61–1.24)
GFR, Estimated: >60 mL/min
Glucose, Bld: 130 mg/dL — ABNORMAL HIGH (ref 70–99)
Potassium: 4.7 mmol/L (ref 3.5–5.1)
Sodium: 138 mmol/L (ref 135–145)
Total Bilirubin: 0.6 mg/dL (ref 0.0–1.2)
Total Protein: 8.5 g/dL — ABNORMAL HIGH (ref 6.5–8.1)

## 2024-05-15 LAB — CBC WITH DIFFERENTIAL/PLATELET
Abs Immature Granulocytes: 0.04 10*3/uL (ref 0.00–0.07)
Basophils Absolute: 0 10*3/uL (ref 0.0–0.1)
Basophils Relative: 0 %
Eosinophils Absolute: 0.1 10*3/uL (ref 0.0–0.5)
Eosinophils Relative: 1 %
HCT: 43.3 % (ref 39.0–52.0)
Hemoglobin: 12.5 g/dL — ABNORMAL LOW (ref 13.0–17.0)
Immature Granulocytes: 0 %
Lymphocytes Relative: 9 %
Lymphs Abs: 0.9 10*3/uL (ref 0.7–4.0)
MCH: 27.7 pg (ref 26.0–34.0)
MCHC: 28.9 g/dL — ABNORMAL LOW (ref 30.0–36.0)
MCV: 96 fL (ref 80.0–100.0)
Monocytes Absolute: 0.4 10*3/uL (ref 0.1–1.0)
Monocytes Relative: 4 %
Neutro Abs: 8.7 10*3/uL — ABNORMAL HIGH (ref 1.7–7.7)
Neutrophils Relative %: 86 %
Platelets: 298 10*3/uL (ref 150–400)
RBC: 4.51 MIL/uL (ref 4.22–5.81)
RDW: 14.6 % (ref 11.5–15.5)
WBC: 10.1 10*3/uL (ref 4.0–10.5)
nRBC: 0 % (ref 0.0–0.2)

## 2024-05-15 LAB — URINALYSIS, ROUTINE W REFLEX MICROSCOPIC
Bilirubin Urine: NEGATIVE
Glucose, UA: NEGATIVE mg/dL
Hgb urine dipstick: NEGATIVE
Ketones, ur: NEGATIVE mg/dL
Nitrite: POSITIVE — AB
Protein, ur: 30 mg/dL — AB
Specific Gravity, Urine: 1.011 (ref 1.005–1.030)
WBC, UA: >50 WBC/hpf (ref 0–5)
pH: 9 — ABNORMAL HIGH (ref 5.0–8.0)

## 2024-05-15 LAB — RESP PANEL BY RT-PCR (RSV, FLU A&B, COVID)  RVPGX2
Influenza A by PCR: NEGATIVE
Influenza B by PCR: NEGATIVE
Resp Syncytial Virus by PCR: NEGATIVE
SARS Coronavirus 2 by RT PCR: NEGATIVE

## 2024-05-15 LAB — BLOOD GAS, VENOUS
Acid-Base Excess: 23.4 mmol/L — ABNORMAL HIGH (ref 0.0–2.0)
Bicarbonate: 52.3 mmol/L — ABNORMAL HIGH (ref 20.0–28.0)
O2 Saturation: 33.5 %
Patient temperature: 37
pCO2, Ven: 77 mmHg (ref 44–60)
pH, Ven: 7.44 — ABNORMAL HIGH (ref 7.25–7.43)
pO2, Ven: <31 mmHg — CL (ref 32–45)

## 2024-05-15 LAB — AMMONIA: Ammonia: 20 umol/L (ref 9–35)

## 2024-05-15 LAB — PRO BRAIN NATRIURETIC PEPTIDE: Pro Brain Natriuretic Peptide: 157 pg/mL

## 2024-05-15 MED ORDER — DULOXETINE HCL 60 MG PO CPEP
120.0000 mg | ORAL_CAPSULE | Freq: Every day | ORAL | Status: DC
  Administered 2024-05-16 – 2024-05-18 (×3): 120 mg via ORAL
  Filled 2024-05-15 (×3): qty 2

## 2024-05-15 MED ORDER — ACETAMINOPHEN 650 MG RE SUPP: 650.0000 mg | Freq: Four times a day (QID) | RECTAL | Status: DC | PRN

## 2024-05-15 MED ORDER — SODIUM CHLORIDE 0.9 % IV SOLN
100.0000 mg | Freq: Two times a day (BID) | INTRAVENOUS | Status: DC
  Administered 2024-05-15 – 2024-05-18 (×7): 100 mg via INTRAVENOUS
  Filled 2024-05-15 (×7): qty 100

## 2024-05-15 MED ORDER — ONDANSETRON HCL 4 MG/2ML IJ SOLN: 4.0000 mg | Freq: Four times a day (QID) | INTRAMUSCULAR | Status: DC | PRN

## 2024-05-15 MED ORDER — SODIUM CHLORIDE 0.9 % IV SOLN
2.0000 g | Freq: Once | INTRAVENOUS | Status: AC
  Administered 2024-05-15: 2 g via INTRAVENOUS
  Filled 2024-05-15: qty 12.5

## 2024-05-15 MED ORDER — VANCOMYCIN HCL 2000 MG/400ML IV SOLN
2000.0000 mg | Freq: Once | INTRAVENOUS | Status: AC
  Administered 2024-05-15: 2000 mg via INTRAVENOUS
  Filled 2024-05-15: qty 400

## 2024-05-15 MED ORDER — ONDANSETRON HCL 4 MG PO TABS: 4.0000 mg | ORAL_TABLET | Freq: Four times a day (QID) | ORAL | Status: DC | PRN

## 2024-05-15 MED ORDER — ACETAMINOPHEN 325 MG PO TABS
650.0000 mg | ORAL_TABLET | Freq: Four times a day (QID) | ORAL | Status: DC | PRN
  Administered 2024-05-17: 650 mg via ORAL
  Filled 2024-05-15: qty 2

## 2024-05-15 MED ORDER — DOXYCYCLINE HYCLATE 100 MG PO TABS: 100.0000 mg | ORAL_TABLET | Freq: Two times a day (BID) | ORAL | Status: DC

## 2024-05-15 MED ORDER — SODIUM CHLORIDE 0.9 % IV SOLN: 100.0000 mg | Freq: Two times a day (BID) | INTRAVENOUS | Status: DC

## 2024-05-15 MED ORDER — PANTOPRAZOLE SODIUM 40 MG PO TBEC: 40.0000 mg | DELAYED_RELEASE_TABLET | Freq: Every day | ORAL | Status: DC

## 2024-05-15 MED ORDER — ENOXAPARIN SODIUM 80 MG/0.8ML IJ SOSY
80.0000 mg | PREFILLED_SYRINGE | INTRAMUSCULAR | Status: DC
  Administered 2024-05-15 – 2024-05-17 (×3): 80 mg via SUBCUTANEOUS
  Filled 2024-05-15 (×4): qty 0.8

## 2024-05-15 MED ORDER — SODIUM CHLORIDE 0.9 % IV SOLN
2.0000 g | INTRAVENOUS | Status: DC
  Administered 2024-05-15 – 2024-05-17 (×3): 2 g via INTRAVENOUS
  Filled 2024-05-15 (×3): qty 20

## 2024-05-15 MED ORDER — FUROSEMIDE 10 MG/ML IJ SOLN
40.0000 mg | Freq: Once | INTRAMUSCULAR | Status: AC
  Administered 2024-05-15: 40 mg via INTRAVENOUS
  Filled 2024-05-15: qty 4

## 2024-05-15 MED ORDER — ALBUTEROL SULFATE (2.5 MG/3ML) 0.083% IN NEBU: 2.5000 mg | INHALATION_SOLUTION | RESPIRATORY_TRACT | Status: DC | PRN

## 2024-05-15 MED ORDER — AZITHROMYCIN 250 MG PO TABS: 500.0000 mg | ORAL_TABLET | Freq: Every day | ORAL | Status: DC

## 2024-05-15 NOTE — ED Triage Notes (Signed)
 Pt BIB [facility] from home for a fall after getting out of bed. Wife reports his legs gave out. Denies hitting head or blood thinner use. Supposed to wear continuous oxygen  2-3 L, but was not wearing it. a&o x 2 with EMS, now a&o x 4 with oxygen  on. Pt reports no pain or injuries. Fentanyl  pump on abd for chronic pain.   95 on 3L- 75 initially C02 60 130/76 HR 82 CBG 192

## 2024-05-15 NOTE — ED Provider Notes (Signed)
 Clinical Course as of 05/15/24 0759  [place] May 15, 2024  0708 Received signout.  59 year old morbidly obese presented with AMS; reported fall at home out of bed. Found to have urinary tract infection as well as hypercarbic respiratory failure on BiPAP.  Noncompliant with his home CPAP.  Evaluated patient.  Saturating 100%.  Awoke easily to voice.  Follows commands,  able to tell me his name, not in any pain.  Appears to have some cognitive slowing.  Pending CT scans. [TY]  0753 CT HEAD WO CONTRAST ( ) IMPRESSION: 1. No acute intracranial abnormality. 2. Bilateral mastoid air cell effusions   [TY]  0755 CT CHEST ABDOMEN PELVIS WO CONTRAST IMPRESSION: 1. Ground-glass opacities in the left lung base. 2. Streaky right basilar atelectasis. 3. Nonobstructive bilateral renal calculi measuring approximately 2 to 3 mm. No evidence of obstructive uropathy. 4. Cholelithiasis. No evidence of cholecystitis.  Electronically signed by: Evalene Coho MD 05/15/2024 07:53 AM EST RP Workstation: [id number]   [TY]  9241 Reevaluated; mentation appears to be improving.  More alert.  Appears to have left lower lobe pneumonia on CT scan.  CT head and CT chest abdomen pelvis without acute findings otherwise.  Will admit for hypercarbic respiratory failure, UTI and pneumonia. [TY]    Clinical Course User Index [TY] Neysa Caron JINNY ROSALEA Neysa Caron JINNY, [place] 05/15/24 [phone]

## 2024-05-15 NOTE — H&P (Signed)
 " History and Physical  CUTTER PASSEY FMW:[id number] DOB: 09/18/1965 DOA: 05/15/2024  PCP: Seabron Lenis, MD   Chief Complaint: Fall out of bed, confusion  HPI: [name] is a 59 y.o. male with medical history significant for morbid obesity, diastolic congestive heart failure, OSA, chronic hypoxia on 2.5 L around-the-clock being admitted to the hospital with acute metabolic encephalopathy due to recurrent hypoxic and hypercarbic respiratory failure in the setting of suspected UTI and pneumonia.  History is provided mainly by the patient's wife, with whom I spoke over the phone.  She states that at baseline the patient's mental status is completely normal, due to his chronic back pain as well as severe morbid obesity, he is not very mobile and uses a wheelchair.  Due to these factors, he also has a hard time following up with his physicians.  She states that over the last 3 or 4 weeks, the family all caught a cold from one of his caregivers, and the patient has been coughing quite a bit.  He has been wearing his baseline home oxygen , is still in the process of trying to get a sleep study as well as PFTs done so that he can get a home CPAP, or noninvasive ventilator.  Last night, he was acting a little bit confused, his wife initially found him with his leg sticking out of the bed, and then later in the night she awoke to him having knocked most of the things off of his nightstand, and found him on the ground.  When EMS arrived, he was found to be saturating 75% on room air, was placed on 3 L and was saturating 95%.  Review of Systems: Please see HPI for pertinent positives and negatives. A complete 10 system review of systems are otherwise negative.  Past Medical History:  Diagnosis Date   Arthritis    Asthma    as child   Back pain    CHF (congestive heart failure) (HCC)    COPD (chronic obstructive pulmonary disease) (HCC)    GERD (gastroesophageal reflux disease)    Headache    Hernia     History of hiatal hernia    History of kidney stones    Hypogonadism in male    Neuromuscular disorder (HCC)    back injury with surgery   On home oxygen  therapy    PONV (postoperative nausea and vomiting)    gets nausea   Pre-diabetes    Shortness of breath dyspnea    Sleep apnea    Does not use CPAP, new equipment 12/2014   Ulcer    Wears glasses    Past Surgical History:  Procedure Laterality Date   BACK SURGERY     CYSTOSCOPY W/ URETERAL STENT PLACEMENT Left 03/31/2022   Procedure: CYSTOSCOPY WITH RETROGRADE PYELOGRAM/URETERAL STENT PLACEMENT;  Surgeon: Devere Lonni Righter, MD;  Location: [facility];  Service: Urology;  Laterality: Left;   CYSTOSCOPY/URETEROSCOPY/HOLMIUM LASER/STENT PLACEMENT Bilateral 07/02/2022   Procedure: CYSTOSCOPY, LEFT STENT REMOVAL, BILATERAL URETEROSCOPY, HOLMIUM LASER, AND RIGHT STENT PLACEMENT;  Surgeon: Devere Lonni Righter, MD;  Location: [facility];  Service: Urology;  Laterality: Bilateral;  90 MINUTES   JOINT REPLACEMENT     Left knee replacement   LAPAROSCOPIC GASTRIC SLEEVE RESECTION N/A 01/08/2015   Procedure: LAPAROSCOPIC GASTRIC SLEEVE RESECTION;  Surgeon: Donnice Lunger, MD;  Location: [facility];  Service: General;  Laterality: N/A;   morphine  pump     RIGHT HEART CATH N/A 09/22/2019   Procedure: RIGHT HEART CATH;  Surgeon: Cherrie Toribio SAUNDERS, MD;  Location: [facility] INVASIVE CV LAB;  Service: Cardiovascular;  Laterality: N/A;   SHOULDER ARTHROSCOPY WITH SUBACROMIAL DECOMPRESSION AND BICEP TENDON REPAIR Left 10/06/2013   Procedure: LEFT SHOULDER ARTHROSCOPY DEBRIDEMENT EXTENTSIVE,DISTAL CLAVICULECTOMY,DECOMPRESSION SUBACROMIAL PARTIAL ACROMIOPLASTY WITH ROTATOR CUFF REPAIR, and excision of CALCIUM  DEPOSIT;  Surgeon: Toribio JULIANNA Chancy, MD;  Location: [facility];  Service: Orthopedics;  Laterality: Left;   SHOULDER ARTHROSCOPY WITH SUBACROMIAL DECOMPRESSION, ROTATOR CUFF REPAIR AND BICEP TENDON REPAIR Right 08/18/2013   Procedure: RIGHT  SHOULDER ARTHROSCOPY WITH SUBACROMIAL DECOMPRESSION/PARTIAL ACROMIOPLASTY WITH CORACOACROMIAL RELEASE/DISTAL CLAVICULECTOMY/ ROTATOR CUFF REPAIR/DEBRIDEMENT EXTENTSIVE;  Surgeon: Toribio JULIANNA Chancy, MD;  Location: [facility];  Service: Orthopedics;  Laterality: Right;  ANESTHESIA: GENERAL, PRE/POST OP SCALENE   TONSILLECTOMY     Social History:  reports that he has never smoked. His smokeless tobacco use includes snuff and chew. He reports current alcohol use. He reports that he does not use drugs.  Allergies[1]  Family History  Problem Relation Age of Onset   Diabetes Mother    Pneumonia Father      Prior to Admission medications  Medication Sig Start Date End Date Taking? Authorizing Provider  acetaminophen  (TYLENOL ) 500 MG tablet Take 1,000 mg by mouth every 6 (six) hours as needed for mild pain or moderate pain.     [provider]  DULoxetine  (CYMBALTA ) 30 MG capsule Take 90 mg by mouth daily.    [provider]  esomeprazole  (NEXIUM ) 40 MG capsule Take 1 capsule (40 mg total) by mouth every morning. 11/09/23 02/18/24  Lue Elsie BROCKS, MD  fentaNYL  (SUBLIMAZE ) 100 MCG/2ML injection Inject into the vein continuous.    [provider]  furosemide  (LASIX ) 20 MG tablet Take 2 tablets (40 mg total) by mouth daily. 11/09/23 02/18/24  Lue Elsie BROCKS, MD  naloxone  (NARCAN ) nasal spray 4 mg/0.1 mL Place 1 spray into the nose as needed for up to 365 doses (for opioid-induced respiratory depresssion). In case of emergency (overdose), spray once into each nostril. If no response within 3 minutes, repeat application and call 911. 11/21/21 02/18/24  Tanya Glisson, MD  nystatin  (MYCOSTATIN /NYSTOP ) powder Apply topically 2 (two) times daily. Patient taking differently: Apply 1 Application topically daily. 04/04/22   Samtani, Jai-Gurmukh, MD  PRESCRIPTION MEDICATION continuous. Fentanyl  pump- unknown dose    [provider]    Physical Exam: BP  (!) 160/93 (BP Location: Right Wrist)   Pulse 86   Temp 97.7 F (36.5 C) (Axillary)   Resp 20   Ht 5' 11 (1.803 m)   Wt (!) 163.3 kg   SpO2 100%   BMI 50.21 kg/m  General:  Alert, oriented to self only, calm, in no acute distress, wearing BiPAP.  Morbidly obese. Cardiovascular: RRR, no murmurs or rubs, no pitting peripheral edema  Respiratory: Very difficult exam, Abdomen: soft, nontender, nondistended, normal bowel tones heard  Skin: dry, no rashes  Musculoskeletal: no joint effusions, normal range of motion  Psychiatric: appropriate affect, normal speech  Neurologic: extraocular muscles intact, clear speech, moving all extremities with intact sensorium         Labs on Admission:  Basic Metabolic Panel: Recent Labs  Lab 05/15/24 0540  NA 138  K 4.7  CL 92*  CO2 41*  GLUCOSE 130*  BUN 9  CREATININE 0.60*  CALCIUM  9.7   Liver Function Tests: Recent Labs  Lab 05/15/24 0540  AST 14*  ALT 7  ALKPHOS 83  BILITOT 0.6  PROT 8.5*  ALBUMIN 3.6   No results for input(s): LIPASE, AMYLASE in the last 168 hours. Recent Labs  Lab 05/15/24 0540  AMMONIA 20   CBC: Recent Labs  Lab 05/15/24 0540  WBC 10.1  NEUTROABS 8.7*  HGB 12.5*  HCT 43.3  MCV 96.0  PLT 298   Cardiac Enzymes: No results for input(s): CKTOTAL, CKMB, CKMBINDEX, TROPONINI in the last 168 hours. BNP (last 3 results) No results for input(s): BNP in the last 8760 hours.  ProBNP (last 3 results) Recent Labs    05/15/24 0540  PROBNP 157.0    CBG: No results for input(s): GLUCAP in the last 168 hours.  Radiological Exams on Admission: CT CHEST ABDOMEN PELVIS WO CONTRAST Result Date: 05/15/2024 EXAM: CT CHEST, ABDOMEN AND PELVIS WITHOUT CONTRAST 05/15/2024 07:41:26 AM TECHNIQUE: CT of the chest, abdomen and pelvis was performed without the administration of intravenous contrast. Multiplanar reformatted images are provided for review. Automated exposure control, iterative  reconstruction, and/or weight based adjustment of the mA/kV was utilized to reduce the radiation dose to as low as reasonably achievable. COMPARISON: CT of the abdomen and pelvis dated 08/23/2022. CLINICAL HISTORY: Sepsis. FINDINGS: CHEST: MEDIASTINUM AND LYMPH NODES: Heart and pericardium are unremarkable. Mild calcific coronary artery disease. The central airways are clear. No mediastinal, hilar or axillary lymphadenopathy. LUNGS AND PLEURA: Ground-glass opacities present independently within the left lung base. Streaky right basilar atelectasis. No pleural effusion. No pneumothorax. ABDOMEN AND PELVIS: LIVER: Unremarkable. GALLBLADDER AND BILE DUCTS: Radiopaque densities laying independently within the gallbladder. No evidence of cholecystitis. No biliary ductal dilatation. SPLEEN: No acute abnormality. PANCREAS: No acute abnormality. ADRENAL GLANDS: No acute abnormality. KIDNEYS, URETERS AND BLADDER: Nonobstructive calculi present within the kidneys bilaterally, measuring approximately 2 to 3 mm in diameter. No stones in the ureters. No hydronephrosis. No evidence of obstructive uropathy. No perinephric or periureteral stranding. Urinary bladder is unremarkable. GI AND BOWEL: Stomach demonstrates no acute abnormality. The patient is status post reduction gastropexy. There is no bowel obstruction. REPRODUCTIVE ORGANS: No acute abnormality. PERITONEUM AND RETROPERITONEUM: No ascites. No free air. VASCULATURE: Aorta is normal in caliber. ABDOMINAL AND PELVIS LYMPH NODES: No lymphadenopathy. BONES AND SOFT TISSUES: Status post interbody fusion at L3-L4 and L4-L5. Intrathecal catheter and pump are again. Moderate gynecomastia present bilaterally. No acute osseous abnormality. No focal soft tissue abnormality. IMPRESSION: 1. Ground-glass opacities in the left lung base. 2. Streaky right basilar atelectasis. 3. Nonobstructive bilateral renal calculi measuring approximately 2 to 3 mm. No evidence of obstructive  uropathy. 4. Cholelithiasis. No evidence of cholecystitis. Electronically signed by: Evalene Coho MD 05/15/2024 07:53 AM EST RP Workstation: [id number]   CT HEAD WO CONTRAST ( ) Result Date: 05/15/2024 EXAM: CT HEAD WITHOUT CONTRAST 05/15/2024 07:41:26 AM TECHNIQUE: CT of the head was performed without the administration of intravenous contrast. Automated exposure control, iterative reconstruction, and/or weight based adjustment of the mA/kV was utilized to reduce the radiation dose to as low as reasonably achievable. COMPARISON: 10/30/2023 CLINICAL HISTORY: Mental status change, unknown cause. FINDINGS: BRAIN AND VENTRICLES: No acute hemorrhage. No evidence of acute infarct. No hydrocephalus. No extra-axial collection. No mass effect or midline shift. ORBITS: No acute abnormality. SINUSES: No acute abnormality. SOFT TISSUES AND SKULL: Bilateral mastoid air cell effusions. No acute soft tissue abnormality. No skull fracture. IMPRESSION: 1. No acute intracranial abnormality. 2. Bilateral mastoid air cell effusions. Electronically signed by: Evalene Coho MD 05/15/2024 07:45 AM EST RP Workstation: [id number]   DG Chest Portable 1 View Result Date: 05/15/2024 EXAM: 1 VIEW(S)  XRAY OF THE CHEST 05/15/2024 05:51:00 AM COMPARISON: Portable chest 11/03/2023. CLINICAL HISTORY: AMS Altered mental status. FINDINGS: LINES, TUBES AND DEVICES: There are overlying telemetry leadings. LUNGS AND PLEURA: There is a low inspiration with a chronically elevated right hemidiaphragm and limited view of the bases. The visualized lungs show no focal infiltrate, but basilar disease could be missed or obscured. No pleural effusion. No pneumothorax. HEART AND MEDIASTINUM: There is a stable mediastinum with superior widening and mild aortic atherosclerosis. Cardiomegaly is again noted with the central vascular prominence without overt edema. BONES AND SOFT TISSUES: No acute osseous abnormality. IMPRESSION: 1. Cardiomegaly with  central vascular prominence without overt edema. 2. No further appreciable acute cardiopulmonary findings, with low lung volumes limiting evaluation of the lung bases. Electronically signed by: Francis Quam MD 05/15/2024 06:03 AM EST RP Workstation: [id number]   Assessment/Plan [name] is a 59 y.o. male with medical history significant for morbid obesity, diastolic congestive heart failure, OSA, chronic hypoxia on 2.5 L around-the-clock being admitted to the hospital with acute metabolic encephalopathy due to recurrent hypoxic and hypercarbic respiratory failure in the setting of suspected UTI and pneumonia.   Acute toxic and metabolic encephalopathy-due to recurrent hypoxic and hypercarbic respiratory failure in the setting of suspected community-acquired pneumonia and UTI. -Inpatient admission -Monitor closely on progressive unit -Continue BiPAP for the time being, until mental status improves -Treat infections as below -Follow-up peripheral blood cultures  Community-acquired pneumonia-patient without obvious fever or leukocytosis, not meeting sepsis criteria but wife states that he has been coughing quite a bit the last few weeks, which is not typical for him.  CT with left lung base ground glass opacities. -Empiric IV doxycycline  (due to IV azithromycin  shortage) -IV Rocephin  -Check viral respiratory panel  UTI-patient without fever or leukocytosis, denies any dysuria but UA with rare bacteria, large leukocytes, and nitrite positive. -Treat empirically with IV Rocephin   Chronic pain-continue fentanyl  pump, Cymbalta   GERD-no longer taking oral PPI  Chronic diastolic heart failure-patient without overt signs or symptoms of heart failure, BNP is not elevated.  However he does look moderately volume overloaded on exam, has Lasix  prescribed as needed but has not taken for several weeks per his wife. -IV Lasix  40 mg x 1 now  DVT prophylaxis: Lovenox      Code Status: Full  Code  Consults called: None  Admission status: The appropriate patient status for this patient is INPATIENT. Inpatient status is judged to be reasonable and necessary in order to provide the required intensity of service to ensure the patient's safety. The patient's presenting symptoms, physical exam findings, and initial radiographic and laboratory data in the context of their chronic comorbidities is felt to place them at high risk for further clinical deterioration. Furthermore, it is not anticipated that the patient will be medically stable for discharge from the hospital within 2 midnights of admission.    I certify that at the point of admission it is my clinical judgment that the patient will require inpatient hospital care spanning beyond 2 midnights from the point of admission due to high intensity of service, high risk for further deterioration and high frequency of surveillance required  Time spent: 59 minutes  Devani Odonnel CHRISTELLA Gail MD [facility] Pager [phone]  If 7PM-7AM, please contact night-coverage [email or web address] Password [email or web address]  05/15/2024, 9:02 AM      [1]  Allergies Allergen Reactions   Tape Other (See Comments)    Blisters - tolerates paper tape well   "

## 2024-05-15 NOTE — ED Provider Notes (Signed)
 " [place] EMERGENCY DEPARTMENT AT [facility] Provider Note   CSN: [id number] Arrival date & time: 05/15/24  0448     History Chief Complaint  Patient presents with   Fall    HPI [name] is a 59 y.o. male presenting for chief complaint of fall event at home. Family states that he fell out of bed today and is severely confused.  He was asleep for the last 12 hours and then suddenly decided to try to walk tonight. He is completely nonambulatory.  Admitted last year for hypocapnic event and required intubation.  Family did not want to progress to the same levels they called for EMS sooner tonight. Patient's recorded medical, surgical, social, medication list and allergies were reviewed in the Snapshot window as part of the initial history.   Review of Systems   Review of Systems  Constitutional:  Negative for chills and fever.  HENT:  Negative for ear pain and sore throat.   Eyes:  Negative for pain and visual disturbance.  Respiratory:  Negative for cough and shortness of breath.   Cardiovascular:  Negative for chest pain and palpitations.  Gastrointestinal:  Negative for abdominal pain and vomiting.  Genitourinary:  Negative for dysuria and hematuria.  Musculoskeletal:  Negative for arthralgias and back pain.  Skin:  Negative for color change and rash.  Neurological:  Negative for seizures and syncope.  Psychiatric/Behavioral:  Positive for confusion.   All other systems reviewed and are negative.   Physical Exam Updated Vital Signs BP (!) 135/110   Pulse 82   Temp 97.9 F (36.6 C) (Oral)   Resp (!) 22   Ht 5' 11 (1.803 m)   Wt (!) 163.3 kg   SpO2 92%   BMI 50.21 kg/m  Physical Exam Vitals and nursing note reviewed.  Constitutional:      General: He is not in acute distress.    Appearance: He is well-developed.  HENT:     Head: Normocephalic and atraumatic.  Eyes:     Conjunctiva/sclera: Conjunctivae normal.  Cardiovascular:     Rate and  Rhythm: Normal rate and regular rhythm.     Heart sounds: No murmur heard. Pulmonary:     Effort: Pulmonary effort is normal. No respiratory distress.     Breath sounds: Normal breath sounds.  Abdominal:     Palpations: Abdomen is soft.     Tenderness: There is no abdominal tenderness.  Musculoskeletal:        General: No swelling.     Cervical back: Neck supple.  Skin:    General: Skin is warm and dry.     Capillary Refill: Capillary refill takes less than 2 seconds.  Neurological:     Mental Status: He is alert.  Psychiatric:        Mood and Affect: Mood normal.      ED Course/ Medical Decision Making/ A&P    Procedures .Critical Care  Performed by: Jerral Meth, MD Authorized by: Jerral Meth, MD   Critical care provider statement:    Critical care time (minutes):  30   Critical care was necessary to treat or prevent imminent or life-threatening deterioration of the following conditions:  Respiratory failure   Critical care was time spent personally by me on the following activities:  Development of treatment plan with patient or surrogate, discussions with consultants, evaluation of patient's response to treatment, examination of patient, ordering and review of laboratory studies, ordering and review of radiographic studies, ordering  and performing treatments and interventions, pulse oximetry, re-evaluation of patient's condition and review of old charts   Care discussed with: admitting provider      Medications Ordered in ED Medications - No data to display Medical Decision Making:   [name] is a 59 y.o. male who presented to the ED today with altered mental status detailed above.    Patient placed on continuous vitals and telemetry monitoring while in ED which was reviewed periodically.  Complete initial physical exam performed, notably the patient  was HDS in NAD.    Reviewed and confirmed nursing documentation for past medical history, family  history, social history.    Initial Assessment:   With the patient's presentation of altered mental status, most likely diagnosis is delerium 2/2 infectious etiology (UTI/CAP/URI) vs metabolic abnormality (Na/K/Mg/Ca) vs nonspecific etiology. Other diagnoses were considered including (but not limited to) CVA, ICH, intracranial mass, critical dehydration, heptatic dysfunction, uremia, hypercarbia, intoxication, endrocrine abnormality, toxidrome. These are considered less likely due to history of present illness and physical exam findings.   This is most consistent with an acute life/limb threatening illness complicated by underlying chronic conditions.  Initial Plan:  CTH to evaluate for intracranial etiology of patient's symptoms  Screening labs including CBC and Metabolic panel to evaluate for infectious or metabolic etiology of disease.  Urinalysis with reflex culture ordered to evaluate for UTI or relevant urologic/nephrologic pathology.  CXR to evaluate for structural/infectious intrathoracic pathology.  VBG for acid/base status and further toxidrome evaulation BNP/EKG to evaluate for cardiac pathology Objective evaluation as below reviewed   Initial Study Results:   Laboratory  Patient with critical hypercapnia.  Likely recurrence of his obesity hypoventilation  EKG EKG was reviewed independently. Rate, rhythm, axis, intervals all examined and without medically relevant abnormality. ST segments without concerns for elevations.    Radiology:  All images reviewed independently. Agree with radiology report at this time.   DG Chest Portable 1 View Result Date: 05/15/2024 EXAM: 1 VIEW(S) XRAY OF THE CHEST 05/15/2024 05:51:00 AM COMPARISON: Portable chest 11/03/2023. CLINICAL HISTORY: AMS Altered mental status. FINDINGS: LINES, TUBES AND DEVICES: There are overlying telemetry leadings. LUNGS AND PLEURA: There is a low inspiration with a chronically elevated right hemidiaphragm and limited view  of the bases. The visualized lungs show no focal infiltrate, but basilar disease could be missed or obscured. No pleural effusion. No pneumothorax. HEART AND MEDIASTINUM: There is a stable mediastinum with superior widening and mild aortic atherosclerosis. Cardiomegaly is again noted with the central vascular prominence without overt edema. BONES AND SOFT TISSUES: No acute osseous abnormality. IMPRESSION: 1. Cardiomegaly with central vascular prominence without overt edema. 2. No further appreciable acute cardiopulmonary findings, with low lung volumes limiting evaluation of the lung bases. Electronically signed by: Francis Quam MD 05/15/2024 06:03 AM EST RP Workstation: [id number]    Final Assessment and Plan:   Metabolic panel, BNP, urine studies, CT head all pending at time of signout to oncoming team.  Patient placed on BiPAP for critical hypercapnic respiratory failure secondary to obesity hypoventilation syndrome.  Very similar to past presentations.  Patient will need ongoing observation and appropriate disposition pending the studies.    Clinical Impression: No diagnosis found.   Data Unavailable   Final Clinical Impression(s) / ED Diagnoses Final diagnoses:  None    Rx / DC Orders ED Discharge Orders     None         Jerral Meth, MD 05/15/24 [phone]  "

## 2024-05-15 NOTE — Progress Notes (Signed)
" °   05/15/24 0642  BiPAP/CPAP/SIPAP  $ Non-Invasive Ventilator  Non-Invasive Vent Initial  $ Face Mask Medium Yes  BiPAP/CPAP/SIPAP Pt Type Adult  BiPAP/CPAP/SIPAP V60  Mask Type Full face mask  Mask Size Medium  Set Rate 16 breaths/min  Respiratory Rate 20 breaths/min  IPAP 16 cmH20  EPAP 8 cmH2O  FiO2 (%) 35 %  Minute Ventilation 3.9  Leak 14  Peak Inspiratory Pressure (PIP) 16  Tidal Volume (Vt) 246  Patient Home Machine No  Patient Home Mask No  Patient Home Tubing No  Auto Titrate No  Press High Alarm 35 cmH2O  Press Low Alarm 5 cmH2O  Nasal massage performed Yes  CPAP/SIPAP surface wiped down Yes  Device Plugged into RED Power Outlet Yes  BiPAP/CPAP /SiPAP Vitals  Bilateral Breath Sounds Diminished    "

## 2024-05-16 DIAGNOSIS — I5032 Chronic diastolic (congestive) heart failure: Secondary | ICD-10-CM | POA: Diagnosis not present

## 2024-05-16 DIAGNOSIS — Z6841 Body Mass Index (BMI) 40.0 and over, adult: Secondary | ICD-10-CM

## 2024-05-16 DIAGNOSIS — G9341 Metabolic encephalopathy: Secondary | ICD-10-CM

## 2024-05-16 DIAGNOSIS — J189 Pneumonia, unspecified organism: Secondary | ICD-10-CM

## 2024-05-16 DIAGNOSIS — J9621 Acute and chronic respiratory failure with hypoxia: Secondary | ICD-10-CM | POA: Diagnosis not present

## 2024-05-16 DIAGNOSIS — N3000 Acute cystitis without hematuria: Secondary | ICD-10-CM

## 2024-05-16 DIAGNOSIS — J9622 Acute and chronic respiratory failure with hypercapnia: Secondary | ICD-10-CM | POA: Diagnosis not present

## 2024-05-16 DIAGNOSIS — G894 Chronic pain syndrome: Secondary | ICD-10-CM

## 2024-05-16 DIAGNOSIS — K219 Gastro-esophageal reflux disease without esophagitis: Secondary | ICD-10-CM

## 2024-05-16 DIAGNOSIS — G4733 Obstructive sleep apnea (adult) (pediatric): Secondary | ICD-10-CM

## 2024-05-16 DIAGNOSIS — N390 Urinary tract infection, site not specified: Secondary | ICD-10-CM | POA: Insufficient documentation

## 2024-05-16 LAB — RESPIRATORY PANEL BY PCR

## 2024-05-16 LAB — CBC
HCT: 35.8 % — ABNORMAL LOW (ref 39.0–52.0)
Hemoglobin: 10.9 g/dL — ABNORMAL LOW (ref 13.0–17.0)
MCH: 27.4 pg (ref 26.0–34.0)
MCHC: 30.4 g/dL (ref 30.0–36.0)
MCV: 89.9 fL (ref 80.0–100.0)
Platelets: 259 10*3/uL (ref 150–400)
RBC: 3.98 MIL/uL — ABNORMAL LOW (ref 4.22–5.81)
RDW: 15.1 % (ref 11.5–15.5)
WBC: 7.6 10*3/uL (ref 4.0–10.5)
nRBC: 0 % (ref 0.0–0.2)

## 2024-05-16 LAB — COMPREHENSIVE METABOLIC PANEL WITH GFR
ALT: <5 U/L (ref 0–44)
AST: 13 U/L — ABNORMAL LOW (ref 15–41)
Albumin: 3.3 g/dL — ABNORMAL LOW (ref 3.5–5.0)
Alkaline Phosphatase: 69 U/L (ref 38–126)
Anion gap: 6 (ref 5–15)
BUN: 13 mg/dL (ref 6–20)
CO2: 37 mmol/L — ABNORMAL HIGH (ref 22–32)
Calcium: 9.2 mg/dL (ref 8.9–10.3)
Chloride: 94 mmol/L — ABNORMAL LOW (ref 98–111)
Creatinine, Ser: 0.77 mg/dL (ref 0.61–1.24)
GFR, Estimated: >60 mL/min
Glucose, Bld: 136 mg/dL — ABNORMAL HIGH (ref 70–99)
Potassium: 4.2 mmol/L (ref 3.5–5.1)
Sodium: 136 mmol/L (ref 135–145)
Total Bilirubin: 0.5 mg/dL (ref 0.0–1.2)
Total Protein: 7.4 g/dL (ref 6.5–8.1)

## 2024-05-16 MED ORDER — KETOROLAC TROMETHAMINE 30 MG/ML IJ SOLN
30.0000 mg | Freq: Four times a day (QID) | INTRAMUSCULAR | Status: DC | PRN
  Administered 2024-05-16 – 2024-05-18 (×4): 30 mg via INTRAVENOUS
  Filled 2024-05-16 (×4): qty 1

## 2024-05-16 MED ORDER — MELATONIN 3 MG PO TABS
3.0000 mg | ORAL_TABLET | Freq: Every evening | ORAL | Status: DC | PRN
  Administered 2024-05-16 (×2): 3 mg via ORAL
  Filled 2024-05-16 (×2): qty 1

## 2024-05-16 MED ORDER — FUROSEMIDE 40 MG PO TABS: 40.0000 mg | ORAL_TABLET | Freq: Every day | ORAL | Status: DC

## 2024-05-16 MED ORDER — FUROSEMIDE 40 MG PO TABS
40.0000 mg | ORAL_TABLET | Freq: Every day | ORAL | Status: DC
  Administered 2024-05-17 – 2024-05-18 (×2): 40 mg via ORAL
  Filled 2024-05-16 (×2): qty 1

## 2024-05-16 MED ORDER — NYSTATIN 100000 UNIT/GM EX POWD
1.0000 | Freq: Two times a day (BID) | CUTANEOUS | Status: DC
  Administered 2024-05-16 – 2024-05-17 (×3): 1 via TOPICAL
  Filled 2024-05-16: qty 15

## 2024-05-16 MED ORDER — PANTOPRAZOLE SODIUM 40 MG PO TBEC
40.0000 mg | DELAYED_RELEASE_TABLET | Freq: Every day | ORAL | Status: DC
  Administered 2024-05-16 – 2024-05-18 (×2): 40 mg via ORAL
  Filled 2024-05-16 (×2): qty 1

## 2024-05-16 MED ORDER — FUROSEMIDE 10 MG/ML IJ SOLN
40.0000 mg | Freq: Once | INTRAMUSCULAR | Status: AC
  Administered 2024-05-16: 40 mg via INTRAVENOUS
  Filled 2024-05-16: qty 4

## 2024-05-16 MED ORDER — POTASSIUM CHLORIDE CRYS ER 20 MEQ PO TBCR
20.0000 meq | EXTENDED_RELEASE_TABLET | Freq: Every day | ORAL | Status: DC
  Administered 2024-05-16 – 2024-05-18 (×3): 20 meq via ORAL
  Filled 2024-05-16 (×3): qty 1

## 2024-05-16 MED ORDER — SORBITOL 70 % SOLN
30.0000 mL | Freq: Once | Status: AC
  Administered 2024-05-16: 30 mL via ORAL
  Filled 2024-05-16: qty 30

## 2024-05-16 MED ORDER — POLYETHYLENE GLYCOL 3350 17 G PO PACK
17.0000 g | PACK | Freq: Two times a day (BID) | ORAL | Status: DC
  Administered 2024-05-16 – 2024-05-17 (×2): 17 g via ORAL
  Filled 2024-05-16 (×4): qty 1

## 2024-05-16 MED ORDER — SENNOSIDES-DOCUSATE SODIUM 8.6-50 MG PO TABS
1.0000 | ORAL_TABLET | Freq: Two times a day (BID) | ORAL | Status: DC
  Administered 2024-05-16 – 2024-05-17 (×2): 1 via ORAL
  Filled 2024-05-16 (×4): qty 1

## 2024-05-16 NOTE — Plan of Care (Signed)
  Problem: Health Behavior/Discharge Planning: Goal: Ability to manage health-related needs will improve Outcome: Progressing   Problem: Clinical Measurements: Goal: Diagnostic test results will improve Outcome: Progressing Goal: Respiratory complications will improve Outcome: Progressing   

## 2024-05-17 ENCOUNTER — Telehealth: Payer: Self-pay | Admitting: Pulmonary Disease

## 2024-05-17 DIAGNOSIS — I5032 Chronic diastolic (congestive) heart failure: Secondary | ICD-10-CM | POA: Diagnosis not present

## 2024-05-17 DIAGNOSIS — G894 Chronic pain syndrome: Secondary | ICD-10-CM | POA: Diagnosis not present

## 2024-05-17 DIAGNOSIS — J9621 Acute and chronic respiratory failure with hypoxia: Secondary | ICD-10-CM | POA: Diagnosis not present

## 2024-05-17 LAB — BASIC METABOLIC PANEL WITH GFR
Anion gap: 4 — ABNORMAL LOW (ref 5–15)
BUN: 13 mg/dL (ref 6–20)
CO2: 38 mmol/L — ABNORMAL HIGH (ref 22–32)
Calcium: 9 mg/dL (ref 8.9–10.3)
Chloride: 94 mmol/L — ABNORMAL LOW (ref 98–111)
Creatinine, Ser: 0.75 mg/dL (ref 0.61–1.24)
GFR, Estimated: >60 mL/min
Glucose, Bld: 125 mg/dL — ABNORMAL HIGH (ref 70–99)
Potassium: 4.7 mmol/L (ref 3.5–5.1)
Sodium: 136 mmol/L (ref 135–145)

## 2024-05-17 LAB — CBC WITH DIFFERENTIAL/PLATELET
Abs Immature Granulocytes: 0.03 10*3/uL (ref 0.00–0.07)
Basophils Absolute: 0 10*3/uL (ref 0.0–0.1)
Basophils Relative: 0 %
Eosinophils Absolute: 0.5 10*3/uL (ref 0.0–0.5)
Eosinophils Relative: 7 %
HCT: 37.6 % — ABNORMAL LOW (ref 39.0–52.0)
Hemoglobin: 10.7 g/dL — ABNORMAL LOW (ref 13.0–17.0)
Immature Granulocytes: 0 %
Lymphocytes Relative: 26 %
Lymphs Abs: 1.8 10*3/uL (ref 0.7–4.0)
MCH: 26.6 pg (ref 26.0–34.0)
MCHC: 28.5 g/dL — ABNORMAL LOW (ref 30.0–36.0)
MCV: 93.5 fL (ref 80.0–100.0)
Monocytes Absolute: 0.4 10*3/uL (ref 0.1–1.0)
Monocytes Relative: 6 %
Neutro Abs: 4.1 10*3/uL (ref 1.7–7.7)
Neutrophils Relative %: 61 %
Platelets: 256 10*3/uL (ref 150–400)
RBC: 4.02 MIL/uL — ABNORMAL LOW (ref 4.22–5.81)
RDW: 15.2 % (ref 11.5–15.5)
WBC: 6.8 10*3/uL (ref 4.0–10.5)
nRBC: 0 % (ref 0.0–0.2)

## 2024-05-17 LAB — URINE CULTURE: Culture: <10000 — AB

## 2024-05-17 LAB — MAGNESIUM: Magnesium: 2.1 mg/dL (ref 1.7–2.4)

## 2024-05-17 NOTE — Telephone Encounter (Signed)
 Sent to Terri Pennix on teams to be rescheduled asap

## 2024-05-17 NOTE — Telephone Encounter (Signed)
 I called and spoke with the pt's spouse, OK per DPR. She states pt has been hospitalized again with co2 elevated. He has still not been able to have the split night and needs this done asap. She is asking if there is a way that it can be scheduled now, while is is already at [facility]. I advised not sure if this is possible, but will route to [facility] to check on this. Thanks so much.

## 2024-05-17 NOTE — Progress Notes (Signed)
" °   05/17/24 0028  BiPAP/CPAP/SIPAP  $ Non-Invasive Ventilator  Non-Invasive Vent Subsequent  BiPAP/CPAP/SIPAP Pt Type Adult  BiPAP/CPAP/SIPAP SERVO  Mask Type Full face mask  Mask Size Medium  Set Rate 16 breaths/min  Respiratory Rate 16 breaths/min  IPAP 18 cmH20  EPAP 8 cmH2O  FiO2 (%) 35 %  Minute Ventilation 14.6  Leak 31  Peak Inspiratory Pressure (PIP) 18  Tidal Volume (Vt) 655  Patient Home Machine No  Patient Home Mask No  Patient Home Tubing No  Auto Titrate No  Press High Alarm 25 cmH2O  CPAP/SIPAP surface wiped down Yes  Device Plugged into RED Power Outlet Yes  BiPAP/CPAP /SiPAP Vitals  Resp 16  MEWS Score/Color  MEWS Score 0  MEWS Score Color Green    "

## 2024-05-17 NOTE — Plan of Care (Signed)
  Problem: Clinical Measurements: Goal: Ability to maintain clinical measurements within normal limits will improve Outcome: Progressing Goal: Diagnostic test results will improve Outcome: Progressing   

## 2024-05-18 ENCOUNTER — Other Ambulatory Visit (HOSPITAL_COMMUNITY): Payer: Self-pay

## 2024-05-18 LAB — BASIC METABOLIC PANEL WITH GFR
Anion gap: 5 (ref 5–15)
BUN: 15 mg/dL (ref 6–20)
CO2: 36 mmol/L — ABNORMAL HIGH (ref 22–32)
Calcium: 9.1 mg/dL (ref 8.9–10.3)
Chloride: 97 mmol/L — ABNORMAL LOW (ref 98–111)
Creatinine, Ser: 0.68 mg/dL (ref 0.61–1.24)
GFR, Estimated: >60 mL/min
Glucose, Bld: 105 mg/dL — ABNORMAL HIGH (ref 70–99)
Potassium: 4.6 mmol/L (ref 3.5–5.1)
Sodium: 138 mmol/L (ref 135–145)

## 2024-05-18 LAB — CBC
HCT: 37 % — ABNORMAL LOW (ref 39.0–52.0)
Hemoglobin: 10.6 g/dL — ABNORMAL LOW (ref 13.0–17.0)
MCH: 27 pg (ref 26.0–34.0)
MCHC: 28.6 g/dL — ABNORMAL LOW (ref 30.0–36.0)
MCV: 94.1 fL (ref 80.0–100.0)
Platelets: 274 10*3/uL (ref 150–400)
RBC: 3.93 MIL/uL — ABNORMAL LOW (ref 4.22–5.81)
RDW: 15 % (ref 11.5–15.5)
WBC: 5.5 10*3/uL (ref 4.0–10.5)
nRBC: 0 % (ref 0.0–0.2)

## 2024-05-18 MED ORDER — SENNOSIDES-DOCUSATE SODIUM 8.6-50 MG PO TABS: 1.0000 | ORAL_TABLET | Freq: Every evening | ORAL | Status: AC | PRN

## 2024-05-18 MED ORDER — AMOXICILLIN-POT CLAVULANATE 875-125 MG PO TABS
1.0000 | ORAL_TABLET | Freq: Two times a day (BID) | ORAL | 0 refills | Status: AC
  Filled 2024-05-18: qty 10, 5d supply, fill #0

## 2024-05-18 MED ORDER — POLYETHYLENE GLYCOL 3350 17 GM/SCOOP PO POWD
17.0000 g | Freq: Every day | ORAL | 0 refills | Status: AC | PRN
  Filled 2024-05-18: qty 238, 14d supply, fill #0

## 2024-05-18 NOTE — Care Management Important Message (Signed)
 Important Message  Patient Details IM Letter given Name: [name] MRN: [id number] Date of Birth: [birth year]   Important Message Given:        [name] 05/18/2024, 1:54 PM

## 2024-05-18 NOTE — Progress Notes (Signed)
" °   05/18/24 1225  TOC Brief Assessment  Insurance and Status Reviewed  Patient has primary care physician Yes  Home environment has been reviewed home with spouse  Prior level of function: indepednent  Prior/Current Home Services No current home services  Social Drivers of Health Review SDOH reviewed no interventions necessary  Readmission risk has been reviewed Yes  Transition of care needs no transition of care needs at this time    "

## 2024-05-18 NOTE — Plan of Care (Signed)
   Problem: Education: Goal: Knowledge of General Education information will improve Description: Including pain rating scale, medication(s)/side effects and non-pharmacologic comfort measures Outcome: Progressing   Problem: Clinical Measurements: Goal: Will remain free from infection Outcome: Progressing

## 2024-05-18 NOTE — Progress Notes (Signed)
 Discharge instructions reviewed with patient, verbalized understanding. All questions answered. All belongings accounted for. Patient to follow up with MD in  1 weeks.   Discharge Medications delivered from TOC meds to bed [facility] outpatient pharmacy by this RN.

## 2024-05-19 ENCOUNTER — Ambulatory Visit: Admitting: Podiatry

## 2024-05-20 LAB — CULTURE, BLOOD (ROUTINE X 2)
Culture: NO GROWTH
Culture: NO GROWTH
Special Requests: ADEQUATE
Special Requests: ADEQUATE

## 2024-07-13 ENCOUNTER — Ambulatory Visit (HOSPITAL_BASED_OUTPATIENT_CLINIC_OR_DEPARTMENT_OTHER): Admitting: Pulmonary Disease
